# Patient Record
Sex: Female | Born: 1950 | Race: Asian | Hispanic: No | Marital: Married | Smoking: Never smoker
Health system: Southern US, Community
[De-identification: ages and names within clinical notes are randomized; demographics above are authoritative.]

## PROBLEM LIST (undated history)

## (undated) DIAGNOSIS — R7303 Prediabetes: Secondary | ICD-10-CM

## (undated) DIAGNOSIS — C20 Malignant neoplasm of rectum: Secondary | ICD-10-CM

## (undated) DIAGNOSIS — E119 Type 2 diabetes mellitus without complications: Secondary | ICD-10-CM

## (undated) DIAGNOSIS — R221 Localized swelling, mass and lump, neck: Secondary | ICD-10-CM

## (undated) DIAGNOSIS — I10 Essential (primary) hypertension: Secondary | ICD-10-CM

## (undated) DIAGNOSIS — M6530 Trigger finger, unspecified finger: Secondary | ICD-10-CM

## (undated) HISTORY — DX: Type 2 diabetes mellitus without complications: E11.9

## (undated) HISTORY — DX: Malignant neoplasm of rectum: C20

## (undated) HISTORY — DX: Essential (primary) hypertension: I10

## (undated) HISTORY — DX: Localized swelling, mass and lump, neck: R22.1

---
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
## 1996-07-17 DIAGNOSIS — R221 Localized swelling, mass and lump, neck: Secondary | ICD-10-CM

## 1996-07-17 HISTORY — DX: Localized swelling, mass and lump, neck: R22.1

## 1996-07-17 HISTORY — PX: NECK SURGERY: SHX720

## 2016-06-21 NOTE — Congregational Nurse Program (Signed)
Congregational Nurse Program Note  Date of Encounter: 06/21/2016  Past Medical History: No past medical history on file.  Encounter Details:  Patient came to CN office with her husband seeking a medical home for herself.  She was hoping to get an appointment with [facility] where her husband is a patient but unfortunately they are not accepting new patients until Jan. 2018.  States she sometimes has headaches with tingling in left arm.  BP 160/90.  Referred patient to [facility] [place].  Also Assisted her with reapplying for Medicaid and with Food Stamp problem.  Jake Michaelis RN, Congregational Nurse [phone]     CNP Questionnaire - 06/21/16 2119      Patient Demographics   Is this a new or existing patient? New   Patient is considered a/an Refugee   Race Asian     Patient Assistance   Location of Patient Assistance Not Applicable   Patient's financial/insurance status Self-Pay (Uninsured)   Uninsured Patient ([facility] Onc[facility]) No   Patient referred to apply for the following financial assistance Medicaid   Food insecurities addressed Referred to food bank or Primary school teacher No   Assistance securing medications No   Doctor, hospital the healthcare system;Hypertension     Encounter Details   Primary purpose of visit Chronic Illness/Condition Visit;Navigating the Healthcare System;Other   Was an Emergency Department visit averted? Not Applicable   Does patient have a medical provider? No   Patient referred to Establish PCP;Urgent Care   Was a mental health screening completed? (GAINS tool) No   Does patient have dental issues? No   Does patient have vision issues? No   Does your patient have an abnormal blood pressure today? Yes   Since previous encounter, have you referred patient for abnormal blood pressure that resulted in a new diagnosis or medication change? No   Does your patient have an abnormal  blood glucose today? No   Since previous encounter, have you referred patient for abnormal blood glucose that resulted in a new diagnosis or medication change? No   Was there a life-saving intervention made? No

## 2016-06-22 ENCOUNTER — Ambulatory Visit (INDEPENDENT_AMBULATORY_CARE_PROVIDER_SITE_OTHER): Payer: Self-pay | Admitting: Pulmonary Disease

## 2016-06-22 ENCOUNTER — Encounter: Payer: Self-pay | Admitting: Pulmonary Disease

## 2016-06-22 ENCOUNTER — Encounter (INDEPENDENT_AMBULATORY_CARE_PROVIDER_SITE_OTHER): Payer: Self-pay

## 2016-06-22 VITALS — BP 158/98 | HR 56 | Temp 98.0°F | Wt 123.6 lb

## 2016-06-22 DIAGNOSIS — R42 Dizziness and giddiness: Secondary | ICD-10-CM | POA: Insufficient documentation

## 2016-06-22 DIAGNOSIS — Z0000 Encounter for general adult medical examination without abnormal findings: Secondary | ICD-10-CM | POA: Insufficient documentation

## 2016-06-22 DIAGNOSIS — I10 Essential (primary) hypertension: Secondary | ICD-10-CM

## 2016-06-22 MED ORDER — LISINOPRIL 10 MG PO TABS: 10.0000 mg | ORAL_TABLET | Freq: Every day | ORAL | 1 refills | Status: DC

## 2016-06-22 NOTE — Assessment & Plan Note (Addendum)
Assessment: She has had BP measurements of 160/90 and 158/98 on two separate occasions. Given the level of elevation, will initiate therapy.  Plan: CMP today Start lisinopril 10mg  daily Follow up in 1 month with repeat BMP.

## 2016-06-22 NOTE — Patient Instructions (Signed)
T?ng huy?t p (Hypertension) T?ng huy?t p, th??ng ???c g?i l huy?t p cao, l khi l?c b?m mu qua ??ng m?ch c?a qu v? qu m?nh. ??ng m?ch c?a qu v? l cc m?ch mu mang mu t? tim ?i kh?p c? th? c?a qu v?. K?t qu? ?o huy?t p c m?t con s? cao v m?t con s? th?p, ch?ng h?n nh? 110/72. Con s? cao (tm thu) l p l?c bn trong ??ng m?ch khi tim qu v? b?m. Con s? th?p (tm tr??ng) l p l?c bn trong ??ng m?ch khi tim qu v? gin ra. Huy?t p l t??ng c?n cho qu v? ph?i d??i 120/80. Ch?ng t?ng huy?t p bu?c tim qu v? ph?i lm vi?c v?t v? h?n ?? b?m mu. ??ng m?ch c?a qu v? c th? b? h?p ho?c c?ng. Huy?t p cao khng ???c ?i?u tr? ho?c khng ???c ki?m sot c th? d?n t?i nh?i mu c? tim, ??t qu?, b?nh th?n v nh?ng v?n ?? khc. CC Y?U T? NGUY C? M?t s? y?u t? nguy c? d?n ??n huy?t p cao c th? ki?m sot ???c. M?t s? y?u t? khc th khng. Nh?ng y?u t? nguy c? khng th? ki?m sot ???c bao g?m:  Ch?ng t?c. Qu v? c nguy c? cao h?n n?u qu v? l ng??i M? g?c Phi.  ?? tu?i. Nguy c? t?ng ln theo ?? tu?i.  Gi?i tnh. Nam gi?i c nguy c? cao h?n ph? n? tr??c tu?i 45. Sau tu?i 65, ph? n? c nguy c? cao h?n nam gi?i. Nh?ng y?u t? nguy c? c th? ki?m sot ???c bao g?m:  Khng t?p th? d?c ho?c cc ho?t ??ng th? ch?t ??y ??Marland Kitchen  Th?a cn.  ?n qu nhi?u ch?t bo, ???ng, ca-lo, ho?c mu?i.  U?ng qu nhi?u r??u. D?U HI?U V TRI?U CH?NG T?ng huy?t p th??ng khng gy ra d?u hi?u ho?c tri?u ch?ng. Huy?t p r?t cao (c?n cao huy?t p) c th? gy ?au ??u, lo l?ng, kh th? v ch?y mu cam. CH?N ?ON ?? ki?m tra xem qu v? c t?ng huy?t p khng, chuyn gia ch?m [place] s?c kh?e c?a qu v? s? ?o huy?t p trong khi qu v? ng?i ??t tay ? m?c ngang v?i tim. Huy?t p c?n ???c ?o t nh?t hai l?n trn cng m?t cnh tay. M?t s? tnh tr?ng nh?t ??nh c th? lm cho huy?t p khc nhau gi?a tay ph?i v tay tri c?a qu v?. K?t qu? ?o huy?t p cao h?n bnh th??ng ? m?t th?i ?i?m no ? khng c ngh?a l qu v? c?n ?i?u tr?Marland Kitchen  N?u khng r li?u qu v? c huy?t p cao hay khng, qu v? c th? ???c ?? ngh? tr? l?i vo m?t ngy khc ?? ki?m tra l?i huy?t p. Ho?c qu v? c th? ???c yu c?u theo di huy?t p ? nh trong 1 tu?n ho?c h?n. ?I?U TR? ?i?u tr? huy?t p cao gao g?m thay ??i l?i s?ng v c th? ph?i dng thu?c. C m?t l?i s?ng lnh m?nh c th? gip lm gi?m huy?t p cao. Qu v? c th? c?n thay ??i m?t s? thi quen. Thay ??i l?i s?ng c th? bao g?m:  Th?c hi?n ch? ?? ?n DASH. Ch? ?? ?n ny c nhi?u tri cy, rau v ng? c?c nguyn h?t. C t mu?i, th?t ??, v t b? sung ???ng.  Duy tr l??ng mu?i tiu th? d??i 2.300 mg m?i ngy.  T?p aerobic t nh?t 30-45 pht t nh?t 4  l?n m?i tu?n.  Gi?m cn n?u c?n thi?t.  Khng ht thu?c.  H?n ch? ?? u?ng c c?n.  H?c cc cch gi?m c?ng th?ng. Chuyn gia ch?m [place] s?c kh?e c th? k ??n thu?c n?u thay ??i l?i s?ng khng ?? ?? ??a huy?t p v? m?c c th? ki?m sot ???c v n?u m?t trong nh?ng ?i?u sau l ?ng:  Qu v? t? 18-59 tu?i v huy?t p tm thu c?a qu v? trn 140.  Qu v? t? 21 tu?i tr? ln v huy?t p tm thu c?a qu v? trn 150.  Huy?t p tm tr??ng c?a qu v? trn 90.  Qu v? b? ti?u ???ng v huy?t p tm thu c?a qu v? trn 140 ho?c huy?t p tm tr??ng c?a qu v? trn 90.  Qu v? b? b?nh th?n v huy?t p qu v? trn 140/90.  Qu v? b? b?nh tim v huy?t p qu v? trn 140/90. Huy?t p m?c tiu c nhn c?a qu v? c th? khc nhau ty thu?c v tnh tr?ng b?nh l, tu?i v cc nhn t? khc. H??NG D?N CH?M [place] T?I NH  Ki?m tra l?i huy?t p c?a qu v? theo ch? d?n c?a chuyn gia ch?m [place] s?c kh?e.  Ch? s? d?ng thu?c theo ch? d?n c?a chuyn gia ch?m [place] s?c kh?e. Lm theo ch? d?n m?t cch c?n th?n. Thu?c ?i?u tr? huy?t p ph?i ???c dng theo ??n ? k. Thu?c c?ng s? khng c tc d?ng khi qu v? b? li?u. Vi?c b? li?u thu?c c?ng lm qu v? c nguy c? pht sinh v?n ??Maggie Schwalbe ht thu?c.  Theo di huy?t p c?a qu v? ? nh theo ch? d?n c?a chuyn gia ch?m [place] Ruiz s?c  kh?e. ?I KHM N?U:  Qu v? ngh? qu v? c ph?n ?ng v?i thu?c ?ang dng.  Qu v? b? ?au ??u ho?c c?m th?y chng m?t ti di?n.  Qu v? b? s?ng ph ? m?t c chn.  Qu v? c v?n ?? v? th? l?c. NGAY L?P T?C ?I KHM N?U:  Qu v? b? ?au ??u n?ng ho?c l l?n.  Qu v? b? y?u b?t th??ng, t b, ho?c c?m th?y nh? ng?t x?u.  Qu v? b? ?au ng?c ho?c ?au b?ng r?t nhi?u.  Qu v? nn nhi?u l?n.  Qu v? b? kh th?. ??M B?O QU V?:  Hi?u r cc h??ng d?n ny.  S? theo di tnh tr?ng c?a mnh.  S? yu c?u tr? gip ngay l?p t?c n?u qu v? c?m th?y khng kh?e ho?c th?y tr?m tr?ng h?n. Thng tin ny khng nh?m m?c ?ch thay th? cho l?i khuyn m chuyn gia ch?m [place] s?c kh?e ni v?i qu v?. Hy b?o ??m qu v? ph?i th?o lu?n b?t k? v?n ?? g m qu v? c v?i chuyn gia ch?m [place] s?c kh?e c?a qu v?. Document Released: 07/03/2005 Document Revised: 03/24/2015 Document Reviewed: 04/25/2013 Elsevier Interactive Patient Education  2017 [employer].

## 2016-06-22 NOTE — Assessment & Plan Note (Addendum)
She does not have immunization records. At follow up or whenever she has insurance coverage, please obtain TB Quantiferon, HIV screen, Hep C screen, Hep B hepatitis B surface antigen (HBSAg), surface antibody (HBSAb or anti-HBS), and core antibody.   Offer Tdap, zoster, PCV13 at follow up. May consider varicella titers prior to offering varicella vaccine. Does not need MMR given she is over age 65.

## 2016-06-22 NOTE — Assessment & Plan Note (Signed)
She has sensations of lightheadedness. Will treat her hypertension and reassess at follow up.  Check TSH at follow up given her left neck surgery history with unknown pathology.

## 2016-06-22 NOTE — Progress Notes (Signed)
   CC: elevated blood pressure  HPI:  [name] is a 65 y.o. [place] speaking [place] woman who is here for follow up of elevated blood pressure.  She has not seen a physician in a long time. She came to the [place] from [place] 4 years ago. Does not have CAD or DM as far as she knows. She did not get regular medical care in [place] and would just be seen for acute issues such as UTI or acute sinusitis.  She has nocturnal polyuria. She denies polydipsia. She has occasional lightheadedness.   Past Medical History:  Diagnosis Date  . Neck mass 1998   Unknown biopsy results. Excised in [place].   Family History  Problem Relation Age of Onset  . Headache Son    Social History   Social History  . Marital status: Married    Spouse name: Scientist, product/process development  . Number of children: N/A  . Years of education: N/A   Social History Main Topics  . Smoking status: Never Smoker  . Smokeless tobacco: Never Used  . Alcohol use No  . Drug use: No  . Sexual activity: Not on file   Other Topics Concern  . Not on file   Social History Narrative   Came to [place] from [place] in 2013.   Does not know much family history [place] medical care.    Review of Systems:   Constitutional: no fevers/chills Eyes: no vision changes Ears, nose, mouth, throat, and face: no cough Respiratory: no shortness of breath Cardiovascular: no chest pain Gastrointestinal: no nausea/vomiting, no abdominal pain, no diarrhea Genitourinary: no dysuria Integument: no rash Hematologic/lymphatic: no bleeding Musculoskeletal: chronic back pain Neurological: chronic paresthesias in left arm  Physical Exam:  Vitals:   06/22/16 0916  BP: (!) 158/98  Pulse: (!) 56  Temp: 98 F (36.7 C)  SpO2: 100%  Weight: 123 lb 9.6 oz (56.1 kg)   General Apperance: NAD HEENT: Normocephalic, atraumatic, anicteric sclera Neck: Supple, trachea midline Lungs: Clear  to auscultation bilaterally. No wheezes, rhonchi or rales. Breathing comfortably Heart: Regular rate and rhythm, no murmur/rub/gallop Abdomen: Soft, nontender, nondistended, no rebound/guarding Extremities: Warm and well perfused, no edema Skin: No rashes or lesions Neurologic: Alert and interactive. No gross deficits.   Assessment & Plan:   See Encounters Tab for problem based charting.  Patient discussed with Dr. Lynnae January

## 2016-06-23 LAB — CBC WITH DIFFERENTIAL/PLATELET
BASOS ABS: 0.1 10*3/uL (ref 0.0–0.2)
BASOS: 2 %
EOS (ABSOLUTE): 0.2 10*3/uL (ref 0.0–0.4)
Eos: 3 %
HEMATOCRIT: 39.5 % (ref 34.0–46.6)
HEMOGLOBIN: 12.6 g/dL (ref 11.1–15.9)
IMMATURE GRANS (ABS): 0 10*3/uL (ref 0.0–0.1)
Immature Granulocytes: 0 %
LYMPHS ABS: 1.6 10*3/uL (ref 0.7–3.1)
Lymphs: 28 %
MCH: 25.9 pg — ABNORMAL LOW (ref 26.6–33.0)
MCHC: 31.9 g/dL (ref 31.5–35.7)
MCV: 81 fL (ref 79–97)
MONOCYTES: 6 %
Monocytes Absolute: 0.3 10*3/uL (ref 0.1–0.9)
NEUTROS ABS: 3.4 10*3/uL (ref 1.4–7.0)
Neutrophils: 61 %
Platelets: 219 10*3/uL (ref 150–379)
RBC: 4.86 x10E6/uL (ref 3.77–5.28)
RDW: 15.1 % (ref 12.3–15.4)
WBC: 5.5 10*3/uL (ref 3.4–10.8)

## 2016-06-23 LAB — BMP8+ANION GAP

## 2016-06-23 NOTE — Progress Notes (Signed)
Internal Medicine Clinic Attending  Case discussed with Dr. Krall at the time of the visit.  We reviewed the resident's history and exam and pertinent patient test results.  I agree with the assessment, diagnosis, and plan of care documented in the resident's note.  

## 2016-06-27 LAB — CMP14 + ANION GAP
A/G RATIO: 1.6 (ref 1.2–2.2)
ALT: 8 IU/L (ref 0–32)
ANION GAP: 14 mmol/L (ref 10.0–18.0)
AST: 15 IU/L (ref 0–40)
Albumin: 4.1 g/dL (ref 3.6–4.8)
Alkaline Phosphatase: 64 IU/L (ref 39–117)
BUN/Creatinine Ratio: 24 (ref 12–28)
BUN: 17 mg/dL (ref 8–27)
Bilirubin Total: 0.3 mg/dL (ref 0.0–1.2)
CALCIUM: 9.2 mg/dL (ref 8.7–10.3)
CO2: 25 mmol/L (ref 18–29)
CREATININE: 0.72 mg/dL (ref 0.57–1.00)
Chloride: 105 mmol/L (ref 96–106)
GFR, EST AFRICAN AMERICAN: 102 mL/min/{1.73_m2} (ref >59)
GFR, EST NON AFRICAN AMERICAN: 88 mL/min/{1.73_m2} (ref >59)
GLUCOSE: 92 mg/dL (ref 65–99)
Globulin, Total: 2.5 g/dL (ref 1.5–4.5)
POTASSIUM: 4.2 mmol/L (ref 3.5–5.2)
Sodium: 144 mmol/L (ref 134–144)
TOTAL PROTEIN: 6.6 g/dL (ref 6.0–8.5)

## 2016-06-27 LAB — SPECIMEN STATUS REPORT

## 2016-07-27 ENCOUNTER — Telehealth: Payer: Self-pay | Admitting: Internal Medicine

## 2016-07-27 NOTE — Telephone Encounter (Signed)
APT. REMINDER CALL, LMTCB °

## 2016-07-28 ENCOUNTER — Ambulatory Visit: Payer: Self-pay

## 2016-08-04 ENCOUNTER — Encounter: Payer: Self-pay | Admitting: Internal Medicine

## 2016-08-04 ENCOUNTER — Ambulatory Visit (INDEPENDENT_AMBULATORY_CARE_PROVIDER_SITE_OTHER): Payer: Self-pay | Admitting: Internal Medicine

## 2016-08-04 VITALS — BP 151/81 | HR 50 | Temp 97.9°F | Wt 118.9 lb

## 2016-08-04 DIAGNOSIS — M79642 Pain in left hand: Secondary | ICD-10-CM

## 2016-08-04 DIAGNOSIS — G5603 Carpal tunnel syndrome, bilateral upper limbs: Secondary | ICD-10-CM | POA: Insufficient documentation

## 2016-08-04 DIAGNOSIS — I10 Essential (primary) hypertension: Secondary | ICD-10-CM

## 2016-08-04 MED ORDER — IBUPROFEN 200 MG PO TABS: 200.0000 mg | ORAL_TABLET | Freq: Four times a day (QID) | ORAL | 0 refills | Status: DC | PRN

## 2016-08-04 MED ORDER — LISINOPRIL 20 MG PO TABS: 20.0000 mg | ORAL_TABLET | Freq: Every day | ORAL | 1 refills | Status: DC

## 2016-08-04 NOTE — Patient Instructions (Signed)
It was a pleasure to see you today [name].  I have changed your lisinopril prescription from 10 mg daily to 20mg  daily. Taking this medication at night is fine as it lasts almost a day after each dose.  For your hand pain I recommend using over the counter Ibuprofen 200mg  or 400mg  every 6 hours as needed. I suspect there is either arthritis of the joint or tendon irritation either will typically improve with these anti inflammatory medicines.  You should try to return to clinic in about a month for a recheck. Hopefully your blood pressure will be controlled at that time.

## 2016-08-04 NOTE — Progress Notes (Signed)
   CC: Follow up for hypertension  HPI:  [name] is a 66 y.o. [place] woman here for follow up of her hypertension one month after starting lisinopril. She thinks this medicine might be making her slightly drowsy so she is taking it at night. Besides this she has not noticed any other major changes at all. She is still working on getting established with the community assistance program to get better medical access for testing and treatment. On other review today she does also note some pain in her left hand that bothers her mostly when using her hand. This is a chronic problem and she has not been taking any treatment for it. It is distracting but does not significantly affect her ability to work or perform daily activities.   See problem based assessment and plan below for additional details  Past Medical History:  Diagnosis Date  . Neck mass 1998   Unknown biopsy results. Excised in [place].    Review of Systems:  Review of Systems  Constitutional: Negative for malaise/fatigue.  Eyes: Negative for blurred vision.  Respiratory: Negative for cough.   Cardiovascular: Negative for chest pain and leg swelling.  Gastrointestinal: Negative for abdominal pain.  Musculoskeletal: Positive for joint pain.  Skin: Negative for rash.  Neurological: Negative for dizziness.    Physical Exam: Physical Exam  Constitutional: She is well-developed, well-nourished, and in no distress.  Eyes: Conjunctivae are normal.  Neck: No JVD present.  Cardiovascular: Normal rate and regular rhythm.   Pulmonary/Chest: Effort normal and breath sounds normal.  Musculoskeletal: She exhibits no edema.  Mild tenderness over flexor aspect of left 3rd MCP joint without any synovitis, catching, or abnormal ROM  Skin: Skin is warm and dry. No rash noted.    Vitals:   08/04/16 1405  BP: (!) 151/81  Pulse: (!) 50  Temp: 97.9 F (36.6 C)  TempSrc: Oral  SpO2: 97%  Weight: 118 lb 14.4 oz (53.9 kg)     Assessment & Plan:   See Encounters Tab for problem based charting.  Patient discussed with Dr. Beryle Beams

## 2016-08-05 LAB — BMP8+ANION GAP
Anion Gap: 13 mmol/L (ref 10.0–18.0)
BUN / CREAT RATIO: 21 (ref 12–28)
BUN: 16 mg/dL (ref 8–27)
CHLORIDE: 102 mmol/L (ref 96–106)
CO2: 24 mmol/L (ref 18–29)
Calcium: 8.7 mg/dL (ref 8.7–10.3)
Creatinine, Ser: 0.76 mg/dL (ref 0.57–1.00)
GFR, EST AFRICAN AMERICAN: 95 mL/min/{1.73_m2} (ref >59)
GFR, EST NON AFRICAN AMERICAN: 83 mL/min/{1.73_m2} (ref >59)
GLUCOSE: 176 mg/dL — AB (ref 65–99)
POTASSIUM: 3.6 mmol/L (ref 3.5–5.2)
Sodium: 139 mmol/L (ref 134–144)

## 2016-08-07 NOTE — Progress Notes (Signed)
Medicine attending: Medical history, presenting problems, physical findings, and medications, reviewed with resident physician Dr Christopher Rice on the day of the patient visit and I concur with his evaluation and management plan. 

## 2016-08-07 NOTE — Assessment & Plan Note (Signed)
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 She is not particularly distressed by this seems more of just a bothersome problem that is not impairing her daily function. Recommended she can take ibuprofen as needed for symptoms and see if this is beneficial.

## 2016-08-07 NOTE — Assessment & Plan Note (Signed)
Her blood pressure is still elevated today at 151/81. She is tolerating the lisinopril 10 mg without major adverse effects. I'm not sure about her reported mild drowsiness with this medicine for now taking it at night is fine and we can ask about this again.  Check basic metabolic panel Increase lisinopril to 20 mg once daily Follow-up again in a month

## 2016-08-19 ENCOUNTER — Encounter: Payer: Self-pay | Admitting: Internal Medicine

## 2016-09-08 ENCOUNTER — Encounter: Payer: Self-pay | Admitting: Internal Medicine

## 2016-10-11 ENCOUNTER — Ambulatory Visit (HOSPITAL_COMMUNITY)
Admission: RE | Admit: 2016-10-11 | Discharge: 2016-10-11 | Disposition: A | Discharge status: Home / Self Care | Payer: Self-pay | Source: Ambulatory Visit | Attending: Oncology | Admitting: Oncology

## 2016-10-11 ENCOUNTER — Emergency Department (HOSPITAL_COMMUNITY): Admission: EM | Admit: 2016-10-11 | Discharge: 2016-10-11 | Discharge status: Absent without leave | Payer: Self-pay

## 2016-10-11 ENCOUNTER — Ambulatory Visit (INDEPENDENT_AMBULATORY_CARE_PROVIDER_SITE_OTHER): Payer: Self-pay | Admitting: Internal Medicine

## 2016-10-11 ENCOUNTER — Encounter (INDEPENDENT_AMBULATORY_CARE_PROVIDER_SITE_OTHER): Payer: Self-pay

## 2016-10-11 VITALS — BP 117/71 | HR 59 | Temp 98.1°F | Ht <= 58 in | Wt 108.7 lb

## 2016-10-11 DIAGNOSIS — R058 Other specified cough: Secondary | ICD-10-CM

## 2016-10-11 DIAGNOSIS — R634 Abnormal weight loss: Secondary | ICD-10-CM | POA: Insufficient documentation

## 2016-10-11 DIAGNOSIS — R7612 Nonspecific reaction to cell mediated immunity measurement of gamma interferon antigen response without active tuberculosis: Secondary | ICD-10-CM

## 2016-10-11 DIAGNOSIS — R05 Cough: Secondary | ICD-10-CM | POA: Insufficient documentation

## 2016-10-11 DIAGNOSIS — Z8611 Personal history of tuberculosis: Secondary | ICD-10-CM | POA: Insufficient documentation

## 2016-10-11 DIAGNOSIS — R918 Other nonspecific abnormal finding of lung field: Secondary | ICD-10-CM | POA: Insufficient documentation

## 2016-10-11 DIAGNOSIS — I517 Cardiomegaly: Secondary | ICD-10-CM | POA: Insufficient documentation

## 2016-10-11 NOTE — Progress Notes (Signed)
CC: cough, weight loss  HPI:  Jillian Lutz is a 66 y.o. with a PMH of neck mass s/p excision, hypertension presenting to clinic for weight loss and cough.  Patient states that for the past 4 months she has been having a poor appetite and has been losing weight. For the past two weeks she has had a cough productive of clear sputum, fevers, chills, generalized weakness, diaphoresis, and mild dizziness. She denies hemoptysis, chest pain, shortness of breath, nasal congestion, rhinorrhea, sinus pressure, eye discharge, sore throat, lymphadenopathy, abdominal pain, nausea, vomiting, diarrhea, constipation, hematochezia, melena, dysuria, hematuria, vaginal discharge, breast mass, hair/skin changes, dysphagia, lower extremity swelling.  Patient and husband moved from [place] to the [place] 4 years ago and had been to other counties. She denies IVDU, known TB contacts. He husband has also been having a one month history of cough that is now also productive of clear sputum; his quant gold was positive.  She had a swelling in her neck about 20 years ago that was excised in [place]; she does not know what it was and has not received follow up for it.   Please see problem based Assessment and Plan for status of patients chronic conditions.  Past Medical History:  Diagnosis Date  . Neck mass 1998   Unknown biopsy results. Excised in [place].    Review of Systems:   Review of Systems  Constitutional: Positive for chills, diaphoresis, fever and weight loss (unintentional).  HENT: Negative for congestion, sinus pain and sore throat.   Eyes: Negative for blurred vision, double vision and discharge.  Respiratory: Positive for cough and sputum production (clear). Negative for hemoptysis, shortness of breath and wheezing.   Cardiovascular: Negative for chest pain and leg swelling.  Gastrointestinal: Negative for abdominal pain, blood in stool, constipation, diarrhea, melena, nausea and vomiting.    Genitourinary: Negative for dysuria and hematuria.  Musculoskeletal: Negative for joint pain and myalgias.  Skin: Negative for rash.  Neurological: Positive for weakness and headaches. Negative for dizziness, sensory change and focal weakness.  Psychiatric/Behavioral: Negative for substance abuse.    Physical Exam:  Vitals:   10/11/16 1348  BP: 117/71  Pulse: (!) 59  Temp: 98.1 F (36.7 C)  TempSrc: Oral  SpO2: 97%  Weight: 108 lb 11.2 oz (49.3 kg)  Height: 4\' 10"  (1.473 m)   Physical Exam  Constitutional: She is oriented to person, place, and time. She appears well-developed. No distress.  HENT:  Head: Normocephalic and atraumatic.  Mouth/Throat: Oropharynx is clear and moist.  Eyes: EOM are normal. Right eye exhibits no discharge. Left eye exhibits no discharge. No scleral icterus.  Neck: Normal range of motion. Neck supple. No thyromegaly present.  Surgical scar on left anterior neck with small palpable subcutaneous, irregular nodules along scar- likely scar tissue  Cardiovascular: Normal rate, regular rhythm, normal heart sounds and intact distal pulses.  Exam reveals no gallop and no friction rub.   No murmur heard. Pulmonary/Chest: Effort normal and breath sounds normal. She has no wheezes. She has no rales.  Abdominal: Soft. Bowel sounds are normal. She exhibits no distension and no mass. There is no tenderness. There is no guarding.  Musculoskeletal: Normal range of motion. She exhibits no edema or tenderness.  Lymphadenopathy:       Head (right side): No submental, no submandibular, no preauricular, no posterior auricular and no occipital adenopathy present.       Head (left side): No submental, no submandibular, no preauricular, no posterior auricular  and no occipital adenopathy present.    She has no cervical adenopathy.       Right: No supraclavicular adenopathy present.       Left: No supraclavicular adenopathy present.  Neurological: She is alert and oriented to  person, place, and time. No cranial nerve deficit.  Skin: Skin is warm and dry. Capillary refill takes less than 2 seconds. No rash noted. She is not diaphoretic. No erythema. No pallor.  Psychiatric: She has a normal mood and affect. Her behavior is normal. Judgment and thought content normal.    Assessment & Plan:   See Encounters Tab for problem based charting.   Patient discussed with Dr. Nilsa Nutting, MD Internal Medicine PGY1

## 2016-10-11 NOTE — Congregational Nurse Program (Signed)
Congregational Nurse Program Note  Date of Encounter: 10/11/2016  Past Medical History: Past Medical History:  Diagnosis Date  . Neck mass 1998   Unknown biopsy results. Excised in [place].    Encounter Details: Patient came to CN office .  States she has bee sick for 2 weeks with cough, occasional headache, loss of appetite with weight loss.  States sputum is clear when she coughs.  Temp. 97 and BP 140/86.  She has not taken BP medicine today.  Scheduled appointment for 1:15 today at [facility].  Jake Michaelis RN, Congregational Nurse [phone]     CNP Questionnaire - 10/11/16 1854      Patient Demographics   Is this a new or existing patient? Existing   Patient is considered a/an Refugee   Race Asian     Patient Assistance   Location of Patient Assistance Not Applicable   Patient's financial/insurance status [facility] Connects   Uninsured Patient ([facility] Card/Care Connects) No   Patient referred to apply for the following financial assistance Medicaid   Food insecurities addressed Referred to food bank or Primary school teacher No   Assistance securing medications No   Doctor, hospital the healthcare system     Encounter Details   Primary purpose of visit [place];Acute Illness/Condition Visit   Was an Emergency Department visit averted? Not Applicable   Does patient have a medical provider? Yes   Patient referred to Urgent Care;Follow up with established PCP   Was a mental health screening completed? (GAINS tool) No   Does patient have dental issues? No   Does patient have vision issues? No   Does your patient have an abnormal blood pressure today? Yes   Since previous encounter, have you referred patient for abnormal blood pressure that resulted in a new diagnosis or medication change? No   Does your patient have an abnormal blood glucose today? No   Since previous  encounter, have you referred patient for abnormal blood glucose that resulted in a new diagnosis or medication change? No   Was there a life-saving intervention made? No         Clinical Intake - 10/11/16 1351      Pain   Pain  No/denies pain     Nutrition Screen   BMI - recorded 22.72   Nutritional Status BMI of 19-24  Normal   Nutritional Risks None   Diabetes No     Functional Status   Activities of Daily Living Independent   Ambulation Independent   Medication Administration Independent   Home Management Independent     Risk/Barriers   Barriers to Care Management & Learning Language     Abuse/Neglect   Do you feel unsafe in your current relationship? No   Do you feel physically threatened by others? No   Anyone hurting you at home, work, or school? No   Unable to ask? No     Patient Literacy   How often do you need to have someone help you when you read instructions, pamphlets, or other written materials from your doctor or pharmacy? 5 - Always   What is the last grade level you completed in school? 5TH GRADE     Investment banker, operational Needed? Yes   [place]   Interpreter Name LEK [facility]   Patient Declined Interpreter  No   Patient signed [facility] waiver No     Comments  Information entered by : Lucky Rathke NTII [phone]   1:53PM

## 2016-10-11 NOTE — Patient Instructions (Signed)
We are going to check some blood work today. Please go and get your chest imaged today before you leave the hospital.

## 2016-10-12 LAB — HIV ANTIBODY (ROUTINE TESTING W REFLEX): HIV Screen 4th Generation wRfx: NONREACTIVE

## 2016-10-12 LAB — HEPATITIS B SURFACE ANTIBODY,QUALITATIVE: HEP B SURFACE AB, QUAL: REACTIVE

## 2016-10-12 LAB — HEPATITIS B CORE ANTIBODY, TOTAL: Hep B Core Total Ab: POSITIVE — AB

## 2016-10-12 LAB — HEPATITIS C ANTIBODY: Hep C Virus Ab: <0.1 s/co ratio (ref 0.0–0.9)

## 2016-10-12 LAB — HEPATITIS B SURFACE ANTIGEN: Hepatitis B Surface Ag: NEGATIVE

## 2016-10-13 ENCOUNTER — Encounter: Payer: Self-pay | Admitting: Internal Medicine

## 2016-10-13 LAB — ACID FAST SMEAR (AFB, MYCOBACTERIA): Acid Fast Smear: NEGATIVE

## 2016-10-13 NOTE — Assessment & Plan Note (Addendum)
Patient states that for the past 4 months she has been having a poor appetite and has been losing weight. For the past two weeks she has had a cough productive of clear sputum, fevers, chills, generalized weakness, diaphoresis, and mild dizziness. She denies hemoptysis, chest pain, shortness of breath, nasal congestion, rhinorrhea, sinus pressure, eye discharge, sore throat, lymphadenopathy, abdominal pain, nausea, vomiting, diarrhea, constipation, hematochezia, melena, dysuria, hematuria, vaginal discharge, breast mass, hair/skin changes, dysphagia, lower extremity swelling.  Patient and husband moved from [place] to the [place] 4 years ago and had been to other counties. She denies IVDU, known TB contacts. He husband has also been having a one month history of cough that is now also productive of clear sputum; his quant gold was positive.  She had a swelling in her neck about 20 years ago that was excised in [place]; she does not know what it was and has not received follow up for it.  Per our records, she has lost about 15lbs since early December 2017. No concerning findings on exam.   In setting of progressive weight loss, subjective fevers, chills, and productive cough in a [place] immigrant, I am concerned about active TB. The two presenting complaints may be unrelated as she has a history of an excised neck mass of unknown etiology; HIV, viral hepatitis are also in differential for her unexplained weight loss.   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

## 2016-10-13 NOTE — Assessment & Plan Note (Addendum)
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 Etiology may be unrelated to more recent symptoms as she has a history of a neck mass that was excised in [place] of unknown etiology; also would consider HIV, chronic viral hepatitis, other malignancy.  Plan: --CXR - personally reviewed - cardiomegaly, possible RML infiltrate --quant gold --AFB smear and culture --HIV - nonreactive --HCV - nonreactive --HBV sAg, sAb, cAb total - sAg negative, sAb and cAb's positive consistent with previous infection

## 2016-10-16 NOTE — Progress Notes (Signed)
Internal Medicine Clinic Attending  Case discussed with Dr. Jari Favre at the time of the visit.  We reviewed the resident's history and exam and pertinent patient test results.  I agree with the assessment, diagnosis, and plan of care documented in the resident's note.  There is risk that active TB is the cause of this patient's productive cough and weightloss given immigration history from an endemic area. The chest xray is reassuring with no clear infiltrate and one sputum collected in clinic is negative for AFB so far. We provided her with contact info for the health department which can collect two more sputums and treat if necessary. Quant gold is pending to evaluate for latent TB.

## 2016-10-18 ENCOUNTER — Telehealth: Payer: Self-pay

## 2016-10-18 NOTE — Telephone Encounter (Signed)
Received call from TB Nurse [place] at the [facility] Department. She states she needs to make a home visit to do an assessment based on [facility]'s findings which indicate active TB needs to be ruled out.  Since the patient does not speak English she asked my interpreter Diu Hastshorn to call her and explain she was coming to visit her this morning between 10:00 and 10:30 am.  Diu then stayed on the phone and interpreted during the entire home visit.  Blood was drawn and 3 sputum samples will be obtained and tested for both the patient and her husband.  Her employer was notified that she will be out of work at least the rest of this week.  Jake Michaelis RN, Congregational Nurse [phone]

## 2016-10-19 ENCOUNTER — Other Ambulatory Visit: Payer: Self-pay

## 2016-11-25 LAB — ACID FAST CULTURE WITH REFLEXED SENSITIVITIES (MYCOBACTERIA): Acid Fast Culture: NEGATIVE

## 2016-12-13 ENCOUNTER — Ambulatory Visit
Admission: RE | Admit: 2016-12-13 | Discharge: 2016-12-13 | Disposition: A | Discharge status: Home / Self Care | Payer: No Typology Code available for payment source | Source: Ambulatory Visit | Attending: Internal Medicine | Admitting: Internal Medicine

## 2016-12-13 ENCOUNTER — Other Ambulatory Visit: Payer: Self-pay | Admitting: Internal Medicine

## 2016-12-13 DIAGNOSIS — Z09 Encounter for follow-up examination after completed treatment for conditions other than malignant neoplasm: Secondary | ICD-10-CM

## 2016-12-13 NOTE — Congregational Nurse Program (Signed)
Congregational Nurse Program Note  Date of Encounter: 12/13/2016  Past Medical History: Past Medical History:  Diagnosis Date  . Neck mass 1998   Unknown biopsy results. Excised in [place].    Encounter Details:  Congregational nurse and interpreter [name] accompanied patient to [address] For 2 mos. follow-up chest x-ray ordered by [facility] Department.  Jake Michaelis RN, Congregational Nurse [phone]     CNP Questionnaire - 12/13/16 1642      Patient Demographics   Is this a new or existing patient? Existing   Patient is considered a/an Refugee   Race Asian     Patient Assistance   Location of Patient Assistance Not Applicable   Patient's financial/insurance status [facility];[place] [facility] Connect[facility]   Uninsured Patient ([place] Card/Care Connects) Yes   Interventions Not Applicable   Patient referred to apply for the following financial assistance Not Applicable   Food insecurities addressed Not Applicable   Transportation assistance Yes   Type of Assistance Other   Assistance securing medications No   Educational health offerings Health literacy     Encounter Details   Primary purpose of visit [place]   Was an Emergency Department visit averted? Not Applicable   Does patient have a medical provider? Yes   Patient referred to Not Applicable   Was a mental health screening completed? (GAINS tool) No   Does patient have dental issues? No   Does patient have vision issues? No   Does your patient have an abnormal blood pressure today? No   Since previous encounter, have you referred patient for abnormal blood pressure that resulted in a new diagnosis or medication change? No   Does your patient have an abnormal blood glucose today? No   Since previous encounter, have you referred patient for abnormal blood glucose that resulted in a new diagnosis or medication change? No   Was there a  life-saving intervention made? No

## 2016-12-15 NOTE — Congregational Nurse Program (Signed)
Congregational Nurse Program Note  Date of Encounter: 12/15/2016  Past Medical History: Past Medical History:  Diagnosis Date  . Neck mass 1998   Unknown biopsy results. Excised in [place].    Encounter Details:  CN and interpreter [name] accompanied patient to [facility] Department for 2 mos. TB follow-up with Nurse Practitioner Dot Been.  She states that patient's chest x-ray is much improved and patient says she is feeling better except for a poor appetite.  After 1 more week patient will be able to decrease medication frequency from daily to 2 days/per week and will only be on INH and Rifampin for remaining 2 months.  Jake Michaelis RN, Congregational Nurse [phone]     CNP Questionnaire - 12/15/16 1907      Patient Demographics   Is this a new or existing patient? Existing   Patient is considered a/an Refugee   Race Asian     Patient Assistance   Location of Patient Assistance Not Applicable   Patient's financial/insurance status [facility];[place] [facility]/Care Connects   Uninsured Patient ([place] Card/Care Connects) Yes   Interventions Not Applicable   Patient referred to apply for the following financial assistance Not Applicable   Food insecurities addressed Not Applicable   Transportation assistance Yes   Type of Assistance Other   Assistance securing medications No   Educational health offerings Other     Encounter Details   Primary purpose of visit [place]   Was an Emergency Department visit averted? Not Applicable   Does patient have a medical provider? Yes   Patient referred to Not Applicable   Was a mental health screening completed? (GAINS tool) No   Does patient have dental issues? No   Does patient have vision issues? No   Does your patient have an abnormal blood pressure today? No   Since previous encounter, have you referred patient for abnormal blood pressure that resulted in a new diagnosis or  medication change? No   Does your patient have an abnormal blood glucose today? No   Since previous encounter, have you referred patient for abnormal blood glucose that resulted in a new diagnosis or medication change? No   Was there a life-saving intervention made? No

## 2016-12-25 ENCOUNTER — Ambulatory Visit (INDEPENDENT_AMBULATORY_CARE_PROVIDER_SITE_OTHER): Payer: Self-pay | Admitting: Internal Medicine

## 2016-12-25 ENCOUNTER — Encounter: Payer: Self-pay | Admitting: Internal Medicine

## 2016-12-25 VITALS — BP 156/80 | HR 53 | Temp 98.1°F | Ht <= 58 in | Wt 112.5 lb

## 2016-12-25 DIAGNOSIS — G5603 Carpal tunnel syndrome, bilateral upper limbs: Secondary | ICD-10-CM

## 2016-12-25 NOTE — Progress Notes (Signed)
   CC: Bilateral hand pain  HPI:  JillianJillian Lutz is a 66 y.o. woman with increased wrist pain and parasthesias worse in the left than the right hand.   See problem based assessment and plan below for additional details  Past Medical History:  Diagnosis Date  . Neck mass 1998   Unknown biopsy results. Excised in [place].    Review of Systems:  Review of Systems  Skin: Negative for rash.  Neurological: Positive for tingling and sensory change. Negative for focal weakness and weakness.  Psychiatric/Behavioral: The patient does not have insomnia.     Physical Exam: Physical Exam  Constitutional: She is well-developed, well-nourished, and in no distress.  Musculoskeletal: Normal range of motion. She exhibits no edema or tenderness.  Neurological: She exhibits normal muscle tone.  Sensation greater in 5th digit and lateral 4th Positive tinel's test on left wrist, positive phalen's test 5/5 grip strength in both hands  Skin: Skin is warm and dry. No rash noted.    Vitals:   12/25/16 1005  BP: (!) 156/80  Pulse: (!) 53  Temp: 98.1 F (36.7 C)  TempSrc: Oral  SpO2: 100%  Weight: 112 lb 8 oz (51 kg)  Height: 4\' 10"  (1.473 m)    Assessment & Plan:   See Encounters Tab for problem based charting.  Patient discussed with Dr. Lynnae January

## 2016-12-25 NOTE — Patient Instructions (Addendum)
It was a pleasure to see you today [name].  You have carpal tunnel syndrome, worse on the left side.  We have referred you to see Dr. Barbaraann Barthel in [facility] at 9:30AM tomorrow  for this who might be able to provide a splint and recommendations for treatments as needed

## 2016-12-26 ENCOUNTER — Ambulatory Visit (INDEPENDENT_AMBULATORY_CARE_PROVIDER_SITE_OTHER): Payer: Self-pay | Admitting: Family Medicine

## 2016-12-26 ENCOUNTER — Other Ambulatory Visit: Payer: Self-pay | Admitting: Internal Medicine

## 2016-12-26 ENCOUNTER — Encounter: Payer: Self-pay | Admitting: Family Medicine

## 2016-12-26 DIAGNOSIS — M25532 Pain in left wrist: Secondary | ICD-10-CM

## 2016-12-26 DIAGNOSIS — I10 Essential (primary) hypertension: Secondary | ICD-10-CM

## 2016-12-26 DIAGNOSIS — M25531 Pain in right wrist: Secondary | ICD-10-CM

## 2016-12-26 MED ORDER — PREDNISONE 10 MG PO TABS: ORAL_TABLET | ORAL | 0 refills | Status: DC

## 2016-12-26 NOTE — Progress Notes (Signed)
PCP and consultation requested by: Collier Salina, MD  Subjective:   HPI: Patient is a 66 y.o. female here for bilateral wrist pain.  Interpreter line used for visit. Patient reports she's had about 4 months of bialteral wrist pain. Left worse than right. 6/10 on left, 5/10 on right and sharp. No acute injury or trauma. No swelling. Gets some numbness into fingers, especially middle finger on left. She is right handed. About 4 years ago had similar problem and had injection that helped. No skin changes.  Past Medical History:  Diagnosis Date  . Neck mass 1998   Unknown biopsy results. Excised in [place].    Current Outpatient Prescriptions on File Prior to Visit  Medication Sig Dispense Refill  . ibuprofen (ADVIL,MOTRIN) 200 MG tablet Take 1 tablet (200 mg total) by mouth every 6 (six) hours as needed for mild pain or moderate pain. 60 tablet 0  . lisinopril (PRINIVIL,ZESTRIL) 20 MG tablet Take 1 tablet (20 mg total) by mouth daily. 30 tablet 1   No current facility-administered medications on file prior to visit.     Past Surgical History:  Procedure Laterality Date  . NECK SURGERY Left 1998   Excision of mass in [place]    No Known Allergies  Social History   Social History  . Marital status: Married    Spouse name: Scientist, product/process development  . Number of children: N/A  . Years of education: N/A   Occupational History  . Not on file.   Social History Main Topics  . Smoking status: Never Smoker  . Smokeless tobacco: Never Used  . Alcohol use No  . Drug use: No  . Sexual activity: Not on file   Other Topics Concern  . Not on file   Social History Narrative   Came to [place] from [place] in 2013.   Does not know much family history [place] medical care.    Family History  Problem Relation Age of Onset  . Headache Son     BP (!) 158/84   Pulse (!) 50   Ht 4\' 10"  (1.473 m)   Wt 112 lb (50.8 kg)   BMI 23.41  kg/m   Review of Systems: See HPI above.     Objective:  Physical Exam:  Gen: NAD, comfortable in exam room  Right wrist: No gross deformity, swelling, bruising, atrophy. TTP carpal tunnel mildly.  No other tenderness about hand or wrist. FROM digits, wrist with 5/5 strength. No skin changes. No numbness currently. Mild positive tinels and phalens.  Left wrist: No gross deformity, swelling, bruising, atrophy. TTP carpal tunnel.  No other tenderness about hand or wrist. FROM digits, wrist with 5/5 strength. No skin changes. Sensation diminished thumb and index finger. Positive tinels and phalens.   Assessment & Plan:  1. Bilateral wrist pain - 2/2 carpal tunnel syndrome.  Wrist braces at night and as often as possible during the day.  Try prednisone dose pack then switch to aleve or ibuprofen.  Consider nerve conduction studies, injections if not improving.  F/u in 6 weeks.

## 2016-12-26 NOTE — Patient Instructions (Addendum)
You have carpal tunnel syndrome. Wear the wrist brace at nighttime and as often as possible during the day Prednisone dose pack - take until you're finished with this. AFTER finishing this you can take aleve or ibuprofen if needed. Corticosteroid injection is a consideration to help with pain and inflammation if not improving. If not improving, will consider nerve conduction studies to assess severity. Follow up with me in 6 weeks.

## 2016-12-26 NOTE — Telephone Encounter (Signed)
Requesting to speak with a nurse about meds. pt is at the pharmacy, pt does not have transportation. Please call back.

## 2016-12-27 NOTE — Assessment & Plan Note (Addendum)
HPI: She has increased left more than right but bilateral wrist pain and hand parasthesias. 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 Her exam reproduces pain on flexion and percussion of the median nerve with diminished sensation in its distribution.  A: Carpal tunnel syndrome, bilateral but left greater than right. She does not have any red flags for severe disease such as weakness or muscle atrophy.  P: Referred to sports medicine clinic to assist with obtaining a wrist brace

## 2016-12-27 NOTE — Assessment & Plan Note (Signed)
2/2 carpal tunnel syndrome.  Wrist braces at night and as often as possible during the day.  Try prednisone dose pack then switch to aleve or ibuprofen.  Consider nerve conduction studies, injections if not improving.  F/u in 6 weeks.

## 2016-12-29 NOTE — Congregational Nurse Program (Signed)
Congregational Nurse Program Note  Date of Encounter: 12/29/2016  Past Medical History: Past Medical History:  Diagnosis Date  . Neck mass 1998   Unknown biopsy results. Excised in [place].    Encounter Details: Home visit to deliver BP medicine which was picked up from [address]  Patient has been out of medicine for 1 week and did not have tranportation to get it herself.  Jake Michaelis RN, Congregational Nurse [phone]     CNP Questionnaire - 12/29/16 2149      Patient Demographics   Is this a new or existing patient? Existing   Patient is considered a/an Refugee   Race Asian     Patient Assistance   Location of Patient Assistance Not Applicable   Patient's financial/insurance status [facility];[place] [facility]/Care Connects   Uninsured Patient ([place] Card/Care Connects) Yes   Interventions Not Applicable   Patient referred to apply for the following financial assistance Not Applicable   Food insecurities addressed Not Applicable   Transportation assistance No   Assistance securing medications Yes   Type of Assistance Other   Educational health offerings Not Applicable     Encounter Details   Primary purpose of visit Other   Was an Emergency Department visit averted? Not Applicable   Does patient have a medical provider? Yes   Patient referred to Not Applicable   Was a mental health screening completed? (GAINS tool) No   Does patient have dental issues? No   Does patient have vision issues? No   Does your patient have an abnormal blood pressure today? No   Since previous encounter, have you referred patient for abnormal blood pressure that resulted in a new diagnosis or medication change? No   Does your patient have an abnormal blood glucose today? No   Since previous encounter, have you referred patient for abnormal blood glucose that resulted in a new diagnosis or medication change? No   Was there a life-saving intervention made? No          Clinical Intake - 12/25/16 1006      Pre-visit preparation   Pre-visit preparation completed Yes     Pain   Pain  0-10   Pain Score 7    Pain Type Chronic pain  for awhile has worsened in last few weeks   Pain Location Hand   Pain Orientation Right;Left   Pain Radiating Towards goes to wrist and fingers   Pain Descriptors / Indicators Numbness;Tingling   Pain Onset Other (comment)  for awhile worswning    Pain Frequency Constant   Pain Relieving Factors Tylenol, Motrin, ointment to massage fingers    Effect of Pain on Daily Activities problems feeling and bending fingers on left side.  Middle finger right hand is worse      Nutrition Screen   BMI - recorded 23.51   Nutritional Status BMI of 19-24  Normal   Nutritional Risks None   Diabetes No     Functional Status   Activities of Daily Living Independent   Ambulation Independent   Medication Administration Independent   Home Management Independent     Risk/Barriers   Barriers to Care Management & Learning Language  Rhade- interpreter      Abuse/Neglect   Do you feel unsafe in your current relationship? No   Do you feel physically threatened by others? No   Anyone hurting you at home, work, or school? No   Unable to ask? No   Information provided  on Community resources No     Patient Literacy   How often do you need to have someone help you when you read instructions, pamphlets, or other written materials from your doctor or pharmacy? 1 - Never   What is the last grade level you completed in school? 5th Grade      Language Assistant   Interpreter Needed? No   Social worker Name H"lus [place]   Patient Declined Interpreter  No   Patient signed [place] waiver No     Comments   Information entered by : Sander Nephew, RN 12/25/2016 10:13 AM

## 2016-12-29 NOTE — Progress Notes (Signed)
Internal Medicine Clinic Attending  Case discussed with Dr. Rice at the time of the visit.  We reviewed the resident's history and exam and pertinent patient test results.  I agree with the assessment, diagnosis, and plan of care documented in the resident's note.  

## 2017-01-03 NOTE — Congregational Nurse Program (Signed)
Congregational Nurse Program Note  Date of Encounter: 01/03/2017  Past Medical History: Past Medical History:  Diagnosis Date  . Neck mass 1998   Unknown biopsy results. Excised in [place].    Encounter Details:  Home visit with interpreter [name].  Patient states she gave letter from Dr. Barbaraann Barthel regarding "light duty" job restrictions to her boss on Saturday.  She did not go to work yesterday or today because she understood him to say her job required her to perform duties which were listed as restrictions in the letter.  CN called her boss Jacqulyn Bath' and he was planning to call her today to tell her he had found her a position in another department which meets doctor's orders.  She should return to work tomorrow and report to him.  He thinks she did not understand when he told her he would seek a suitable job for her.  He also stated she can wear her wrist splints at work.  Patient very relieved that she can return to work.  Jake Michaelis RN, Congregational Nurse [phone]     CNP Questionnaire - 01/03/17 1801      Patient Demographics   Is this a new or existing patient? Existing   Patient is considered a/an Refugee   Race Asian     Patient Assistance   Location of Patient Assistance Not Applicable   Patient's financial/insurance status [facility];[place] [facility]/Care Connects   Uninsured Patient ([place] Card/Care Connects) Yes   Interventions Not Applicable   Patient referred to apply for the following financial assistance Not Applicable   Food insecurities addressed Not Applicable   Transportation assistance No   Type of Assistance Other   Assistance securing medications No   Educational health offerings Health literacy     Encounter Details   Primary purpose of visit Other   Was an Emergency Department visit averted? Not Applicable   Does patient have a medical provider? Yes   Patient referred to Not Applicable   Was a mental health screening completed? (GAINS  tool) No   Does patient have dental issues? No   Does patient have vision issues? No   Does your patient have an abnormal blood pressure today? No   Since previous encounter, have you referred patient for abnormal blood pressure that resulted in a new diagnosis or medication change? No   Does your patient have an abnormal blood glucose today? No   Since previous encounter, have you referred patient for abnormal blood glucose that resulted in a new diagnosis or medication change? No   Was there a life-saving intervention made? No         Clinical Intake - 12/25/16 1006      Pre-visit preparation   Pre-visit preparation completed Yes     Pain   Pain  0-10   Pain Score 7    Pain Type Chronic pain  for awhile has worsened in last few weeks   Pain Location Hand   Pain Orientation Right;Left   Pain Radiating Towards goes to wrist and fingers   Pain Descriptors / Indicators Numbness;Tingling   Pain Onset Other (comment)  for awhile worswning    Pain Frequency Constant   Pain Relieving Factors Tylenol, Motrin, ointment to massage fingers    Effect of Pain on Daily Activities problems feeling and bending fingers on left side.  Middle finger right hand is worse      Nutrition Screen   BMI - recorded 23.51   Nutritional Status BMI  of 19-24  Normal   Nutritional Risks None   Diabetes No     Functional Status   Activities of Daily Living Independent   Ambulation Independent   Medication Administration Independent   Home Management Independent     Risk/Barriers   Barriers to Care Management & Learning Language  Rhade- interpreter      Abuse/Neglect   Do you feel unsafe in your current relationship? No   Do you feel physically threatened by others? No   Anyone hurting you at home, work, or school? No   Unable to ask? No   Information provided on Community resources No     Patient Literacy   How often do you need to have someone help you when you read instructions, pamphlets,  or other written materials from your doctor or pharmacy? 1 - Never   What is the last grade level you completed in school? 5th Grade      Language Assistant   Interpreter Needed? No   Social worker Name H"lus [place]   Patient Declined Interpreter  No   Patient signed [place] waiver No     Comments   Information entered by : Sander Nephew, RN 12/25/2016 10:13 AM

## 2017-01-05 ENCOUNTER — Ambulatory Visit: Payer: Self-pay | Admitting: Family Medicine

## 2017-02-06 ENCOUNTER — Ambulatory Visit (INDEPENDENT_AMBULATORY_CARE_PROVIDER_SITE_OTHER): Payer: Self-pay | Admitting: Family Medicine

## 2017-02-06 ENCOUNTER — Encounter: Payer: Self-pay | Admitting: Family Medicine

## 2017-02-06 DIAGNOSIS — M65332 Trigger finger, left middle finger: Secondary | ICD-10-CM

## 2017-02-06 DIAGNOSIS — M6530 Trigger finger, unspecified finger: Secondary | ICD-10-CM

## 2017-02-06 DIAGNOSIS — G5603 Carpal tunnel syndrome, bilateral upper limbs: Secondary | ICD-10-CM

## 2017-02-06 MED ORDER — METHYLPREDNISOLONE ACETATE 40 MG/ML IJ SUSP
20.0000 mg | Freq: Once | INTRAMUSCULAR | Status: AC
  Administered 2017-02-06: 20 mg via INTRA_ARTICULAR

## 2017-02-06 NOTE — Patient Instructions (Signed)
You have carpal tunnel syndrome which is improved. I would still recommend for 6 more weeks wearing the wrist braces at nighttime and as often as possible during the day We gave you a cortisone injection for your trigger finger. Follow up with me in 6 weeks.

## 2017-02-07 NOTE — Addendum Note (Signed)
Addended by: Alphonzo Grieve on: 02/07/2017 07:01 PM   Modules accepted: Orders

## 2017-02-08 DIAGNOSIS — M6530 Trigger finger, unspecified finger: Secondary | ICD-10-CM

## 2017-02-08 HISTORY — DX: Trigger finger, unspecified finger: M65.30

## 2017-02-08 NOTE — Assessment & Plan Note (Signed)
Right 3rd digit trigger finger - discussed options - went ahead with injection today.    After informed written consent patient was seated on exam table.  Area overlying right 3rd digit A1 pulley prepped with alcohol swab then injected with 0.5:0.46mL bupivicaine: depomedrol.  Patient tolerated procedure well without immediate complications.

## 2017-02-08 NOTE — Progress Notes (Signed)
PCP and consultation requested by: Collier Salina, MD  Subjective:   HPI: Patient is a 66 y.o. female here for bilateral wrist pain.  6/12: Interpreter line used for visit. Patient reports she's had about 4 months of bialteral wrist pain. Left worse than right. 6/10 on left, 5/10 on right and sharp. No acute injury or trauma. No swelling. Gets some numbness into fingers, especially middle finger on left. She is right handed. About 4 years ago had similar problem and had injection that helped. No skin changes.  7/24: Interpreter present for visit. Patient reports she has improved since last visit. Left wrist up to 3/10 at worst, right 4/10 at worst. Wearing wrist braces and finished prednisone. Numbness much better. Primary problem is left 3rd digit feels like it is swollen and can't fully flex - stiff if tries to do so. Also gets stuck in flexion. No skin changes.  Past Medical History:  Diagnosis Date  . Neck mass 1998   Unknown biopsy results. Excised in [place].    Current Outpatient Prescriptions on File Prior to Visit  Medication Sig Dispense Refill  . ibuprofen (ADVIL,MOTRIN) 200 MG tablet Take 1 tablet (200 mg total) by mouth every 6 (six) hours as needed for mild pain or moderate pain. 60 tablet 0  . lisinopril (PRINIVIL,ZESTRIL) 20 MG tablet TAKE ONE TABLET BY MOUTH ONCE DAILY 30 tablet 2  . predniSONE (DELTASONE) 10 MG tablet 6 tabs po day 1, 5 tabs po day 2, 4 tabs po day 3, 3 tabs po day 4, 2 tabs po day 5, 1 tab po day 6 21 tablet 0   No current facility-administered medications on file prior to visit.     Past Surgical History:  Procedure Laterality Date  . NECK SURGERY Left 1998   Excision of mass in [place]    No Known Allergies  Social History   Social History  . Marital status: Married    Spouse name: Scientist, product/process development  . Number of children: N/A  . Years of education: N/A   Occupational History  . Not on file.   Social History Main Topics   . Smoking status: Never Smoker  . Smokeless tobacco: Never Used  . Alcohol use No  . Drug use: No  . Sexual activity: Not on file   Other Topics Concern  . Not on file   Social History Narrative   Came to [place] from [place] in 2013.   Does not know much family history [place].    Family History  Problem Relation Age of Onset  . Headache Son     BP (!) 158/81   Pulse (!) 55   Ht 4\' 10"  (1.473 m)   Wt 112 lb (50.8 kg)   BMI 23.41 kg/m   Review of Systems: See HPI above.     Objective:  Physical Exam:  Gen: NAD, comfortable in exam room  Right wrist: No gross deformity, swelling, bruising, atrophy. No TTP carpal tunnel.  No other tenderness about hand or wrist. FROM digits, wrist with 5/5 strength. No skin changes. No numbness currently. Negative tinels.  Left wrist: No gross deformity, swelling, bruising, atrophy. No TTP carpal tunnel.  TTP at A1 pulley 3rd digit.  No other tenderness about hand or wrist. FROM wrist with 5/5 strength.  FROM digits except slightly diminished flexion of 3rd digit. No skin changes. Sensation diminished thumb and index finger. Negative tinels.  Assessment & Plan:  1. Bilateral wrist pain - 2/2 carpal tunnel syndrome.  Improved.  Continue wrist braces at night and as often as possible during the day.  S/p prednisone.  Consider nerve conduction studies, injections if not improving.  F/u in 6 weeks.  2. Right 3rd digit trigger finger - discussed options - went ahead with injection today.    After informed written consent patient was seated on exam table.  Area overlying right 3rd digit A1 pulley prepped with alcohol swab then injected with 0.5:0.67mL bupivicaine: depomedrol.  Patient tolerated procedure well without immediate complications.

## 2017-02-08 NOTE — Assessment & Plan Note (Signed)
Improved.  Continue wrist braces at night and as often as possible during the day.  S/p prednisone.  Consider nerve conduction studies, injections if not improving.  F/u in 6 weeks.

## 2017-02-09 ENCOUNTER — Encounter: Payer: Self-pay | Admitting: Internal Medicine

## 2017-02-09 ENCOUNTER — Ambulatory Visit (INDEPENDENT_AMBULATORY_CARE_PROVIDER_SITE_OTHER): Payer: Self-pay | Admitting: Internal Medicine

## 2017-02-09 VITALS — BP 129/69 | HR 60 | Temp 98.1°F | Ht <= 58 in | Wt 117.4 lb

## 2017-02-09 DIAGNOSIS — Z0000 Encounter for general adult medical examination without abnormal findings: Secondary | ICD-10-CM

## 2017-02-09 DIAGNOSIS — Z23 Encounter for immunization: Secondary | ICD-10-CM

## 2017-02-09 DIAGNOSIS — I10 Essential (primary) hypertension: Secondary | ICD-10-CM

## 2017-02-09 DIAGNOSIS — G5603 Carpal tunnel syndrome, bilateral upper limbs: Secondary | ICD-10-CM

## 2017-02-09 DIAGNOSIS — Z79899 Other long term (current) drug therapy: Secondary | ICD-10-CM

## 2017-02-09 NOTE — Progress Notes (Signed)
   CC: Follow up for hypertension and carpal tunnel syndrome  HPI:  [name] is a 66 y.o. woman here for follow up of her carpal tunnel syndrome and her hypertension.   See problem based assessment and plan below for additional details  Past Medical History:  Diagnosis Date  . Neck mass 1998   Unknown biopsy results. Excised in [place].    Review of Systems:  Review of Systems  Constitutional: Negative for chills and fever.  Respiratory: Negative for cough.   Cardiovascular: Negative for chest pain.  Musculoskeletal: Positive for joint pain. Negative for falls, myalgias and neck pain.  Skin: Negative for rash.  Neurological: Negative for tingling, sensory change and focal weakness.    Physical Exam: Physical Exam  Constitutional: No distress.  Cardiovascular: Normal rate and regular rhythm.   Pulmonary/Chest: Effort normal and breath sounds normal.  Musculoskeletal: Normal range of motion. She exhibits no edema or deformity.  Hand pain on forcible flexion of left wrist, sensation intact with normal grip strength    Vitals:   02/09/17 1356  BP: 129/69  Pulse: 60  Temp: 98.1 F (36.7 C)  SpO2: 98%  Weight: 117 lb 6.4 oz (53.3 kg)  Height: 4\' 10"  (1.473 m)    Assessment & Plan:   See Encounters Tab for problem based charting.  Patient discussed with Dr. Evette Doffing

## 2017-02-09 NOTE — Patient Instructions (Signed)
It was a pleasure to see you today [name].  I am glad your hand pain is improving today. I hope this will continue to get better over time. Continue using your wrist brace as able overnight for this going forward.  Your blood pressure is now very good. I recommend continuing to take your medication and staying active.  You received a vaccine for many of the most common bacteria causing pneumonia.

## 2017-02-11 NOTE — Assessment & Plan Note (Signed)
Her wrist pain is about 60% better with use of her splint at night and a course of oral prednisone prescribed by Dr. Barbaraann Barthel. She is having no trouble with work or daily activities due to the pain currently. At this point it is primarily only her left hand that has significant sympoms.  Carpal tunnel syndrome of both hands, L>R, improving  I recommended she continue use of her wrist brace at night as long as tolerable

## 2017-02-11 NOTE — Assessment & Plan Note (Signed)
BP Readings from Last 3 Encounters:  02/09/17 129/69  02/06/17 (!) 158/81  12/26/16 (!) 158/84    Lab Results  Component Value Date   NA 139 08/04/2016   K 3.6 08/04/2016   CREATININE 0.76 08/04/2016    Assessment: Blood pressure is now well controlled at 129/69. She is taking lisinopril daily without difficulties.  Plan: Medications:  continue current medications lisinopril 20mg   daily Other plans: RTC in 6 months

## 2017-02-11 NOTE — Assessment & Plan Note (Signed)
She qualifies for preventative vaccination for with PCV 13 vaccine today due to age 66.  PCV13 vaccine given today

## 2017-02-12 NOTE — Progress Notes (Signed)
Internal Medicine Clinic Attending  Case discussed with Dr. Rice at the time of the visit.  We reviewed the resident's history and exam and pertinent patient test results.  I agree with the assessment, diagnosis, and plan of care documented in the resident's note.  

## 2017-02-16 ENCOUNTER — Ambulatory Visit
Admission: RE | Admit: 2017-02-16 | Discharge: 2017-02-16 | Disposition: A | Discharge status: Home / Self Care | Payer: No Typology Code available for payment source | Source: Ambulatory Visit | Attending: Internal Medicine | Admitting: Internal Medicine

## 2017-02-16 ENCOUNTER — Other Ambulatory Visit: Payer: Self-pay | Admitting: Internal Medicine

## 2017-02-16 DIAGNOSIS — A159 Respiratory tuberculosis unspecified: Secondary | ICD-10-CM

## 2017-02-16 NOTE — Congregational Nurse Program (Signed)
Congregational Nurse Program Note  Date of Encounter: 02/16/2017  Past Medical History: Past Medical History:  Diagnosis Date  . Neck mass 1998   Unknown biopsy results. Excised in [place].    Encounter Details: CN and interpreter [name] accompanied patient to [place] at  [facility]. [employer]. because they do not provide interpreters.  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  Jake Michaelis RN, Congregational Nurse [phone]     CNP Questionnaire - 02/16/17 2139      Patient Demographics   Is this a new or existing patient? Existing   Patient is considered a/an Refugee   Race Asian     Patient Assistance   Location of Patient Assistance Not Applicable   Patient's financial/insurance status [facility] [facility]   Uninsured Patient ([place] [facility] Connects) Yes   Interventions Not Applicable   Patient referred to apply for the following financial assistance Not Applicable   Food insecurities addressed Not Applicable   Transportation assistance No   Assistance securing medications No   Educational health offerings Other     Encounter Details   Primary purpose of visit [place]   Was an Emergency Department visit averted? Not Applicable   Does patient have a medical provider? Yes   Patient referred to Not Applicable   Was a mental health screening completed? (GAINS tool) No   Does patient have dental issues? No   Does patient have vision issues? No   Does your patient have an abnormal blood pressure today? No   Since previous encounter, have you referred patient for abnormal blood pressure that resulted in a new diagnosis or medication change? No   Does your patient have an abnormal blood glucose today? No   Since previous encounter, have you referred patient for abnormal blood glucose that resulted in a new diagnosis or medication change? No   Was there a life-saving intervention made? No         Clinical Intake - 02/09/17 1358      Pre-visit preparation   Pre-visit preparation completed No     Pain   Pain  No/denies pain   Pain Score 0-No pain     Nutrition Screen   BMI - recorded 24.54   Nutritional Status BMI of 19-24  Normal   Nutritional Risks None   Diabetes No     Comments   Information entered by : Silverio Decamp [email or web address]

## 2017-02-20 ENCOUNTER — Ambulatory Visit: Payer: Self-pay

## 2017-02-23 NOTE — Congregational Nurse Program (Signed)
Congregational Nurse Program Note  Date of Encounter: 02/23/2017  Past Medical History: Past Medical History:  Diagnosis Date  . Neck mass 1998   Unknown biopsy results. Excised in [place].    Encounter Details:  CN and interpreter [name] accompanied patient to the [facility] Department for her final TB treatment and exam.  Jake Michaelis RN, Congregational nurse [phone]     CNP Questionnaire - 02/23/17 1220      Patient Demographics   Is this a new or existing patient? Existing   Patient is considered a/an Refugee   Race Asian     Patient Assistance   Location of Patient Assistance Not Applicable   Patient's financial/insurance status [facility] [facility]   Uninsured Patient ([place] [facility] Connects) Yes   Interventions Not Applicable   Patient referred to apply for the following financial assistance Not Applicable   Food insecurities addressed Not Applicable   Transportation assistance No   Assistance securing medications No   Educational health offerings Other     Encounter Details   Primary purpose of visit Other   Was an Emergency Department visit averted? Not Applicable   Does patient have a medical provider? Yes   Patient referred to Not Applicable   Was a mental health screening completed? (GAINS tool) No   Does patient have dental issues? No   Does patient have vision issues? No   Does your patient have an abnormal blood pressure today? No   Since previous encounter, have you referred patient for abnormal blood pressure that resulted in a new diagnosis or medication change? No   Does your patient have an abnormal blood glucose today? No   Since previous encounter, have you referred patient for abnormal blood glucose that resulted in a new diagnosis or medication change? No   Was there a life-saving intervention made? No         Clinical Intake - 02/09/17 1358      Pre-visit preparation   Pre-visit  preparation completed No     Pain   Pain  No/denies pain   Pain Score 0-No pain     Nutrition Screen   BMI - recorded 24.54   Nutritional Status BMI of 19-24  Normal   Nutritional Risks None   Diabetes No     Comments   Information entered by : Silverio Decamp [email or web address]

## 2017-03-20 ENCOUNTER — Encounter: Payer: Self-pay | Admitting: Family Medicine

## 2017-03-20 ENCOUNTER — Ambulatory Visit (INDEPENDENT_AMBULATORY_CARE_PROVIDER_SITE_OTHER): Payer: Self-pay | Admitting: Family Medicine

## 2017-03-20 DIAGNOSIS — G5603 Carpal tunnel syndrome, bilateral upper limbs: Secondary | ICD-10-CM

## 2017-03-20 DIAGNOSIS — M6530 Trigger finger, unspecified finger: Secondary | ICD-10-CM

## 2017-03-20 NOTE — Patient Instructions (Signed)
Your right sided symptoms have resolved. You still have evidence of mild left carpal tunnel syndrome. Continue wearing the brace on left side at work and at nighttime. Use the right brace only if needed now.   You have a mild trigger finger of your left hand - if this worsens where you feel you need a shot give me a call. Follow up with me in 6 weeks or as needed otherwise.

## 2017-03-21 NOTE — Progress Notes (Signed)
PCP and consultation requested by: Collier Salina, MD  Subjective:   HPI: Patient is a 66 y.o. female here for bilateral wrist pain.  6/12: Interpreter line used for visit. Patient reports she's had about 4 months of bilateral wrist pain. Left worse than right. 6/10 on left, 5/10 on right and sharp. No acute injury or trauma. No swelling. Gets some numbness into fingers, especially middle finger on left. She is right handed. About 4 years ago had similar problem and had injection that helped. No skin changes.  7/24: Interpreter present for visit. Patient reports she has improved since last visit. Left wrist up to 3/10 at worst, right 4/10 at worst. Wearing wrist braces and finished prednisone. Numbness much better. Primary problem is left 3rd digit feels like it is swollen and can't fully flex - stiff if tries to do so. Also gets stuck in flexion. No skin changes.  9/4: Interpreter present for visit. Patient reports doing much better after last visit. Injection helped left middle trigger finger. She has only mild soreness now of left 3rd digit with some catching - pain currently at 0/10. She is wearing wrist braces regularly at night and during work. No skin changes. Very mild numbness in left hand at times.  Past Medical History:  Diagnosis Date  . Neck mass 1998   Unknown biopsy results. Excised in [place].    Current Outpatient Prescriptions on File Prior to Visit  Medication Sig Dispense Refill  . lisinopril (PRINIVIL,ZESTRIL) 20 MG tablet TAKE ONE TABLET BY MOUTH ONCE DAILY 30 tablet 2   No current facility-administered medications on file prior to visit.     Past Surgical History:  Procedure Laterality Date  . NECK SURGERY Left 1998   Excision of mass in [place]    No Known Allergies  Social History   Social History  . Marital status: Married    Spouse name: Scientist, product/process development  . Number of children: N/A  . Years of education: N/A   Occupational  History  . Not on file.   Social History Main Topics  . Smoking status: Never Smoker  . Smokeless tobacco: Never Used  . Alcohol use No  . Drug use: No  . Sexual activity: Not on file   Other Topics Concern  . Not on file   Social History Narrative   Came to [place] from [place] in 2013.   Does not know much family history [place] medical care.    Family History  Problem Relation Age of Onset  . Headache Son     BP (!) 141/85   Pulse (!) 57   Ht 4\' 10"  (1.473 m)   Wt 117 lb 6.4 oz (53.3 kg)   BMI 24.54 kg/m   Review of Systems: See HPI above.     Objective:  Physical Exam:  Gen: NAD, comfortable in exam room  Right wrist: No gross deformity, swelling, bruising, atrophy. No TTP carpal tunnel.  No other tenderness about hand or wrist. FROM digits, wrist with 5/5 strength. No skin changes. No numbness currently. Negative tinels, phalens.  Left wrist/hand: Reproduced catching with flexion of 3rd PIP joint.  No gross deformity, swelling, bruising, atrophy. No TTP carpal tunnel.  No TTP at A1 pulley 3rd digit.  No other tenderness about hand or wrist. FROM wrist with 5/5 strength.  FROM digits. Sensation intact to light touch. Negative tinels.   Assessment & Plan:  1. Bilateral wrist  pain - 2/2 carpal tunnel syndrome.  Improved.  Some numbness into left hand - will cntinue with bracing here.  S/p prednisone.  Aleve or ibuprofen only if needed.  Consider nerve conduction studies, injections if not improving.  F/u in 6 weeks or prn.  2. Left 3rd digit trigger finger - s/p injection which helped but still with some catching.  Declined repeat injection, splinting for now.  F/u in 6 weeks or prn.

## 2017-03-21 NOTE — Assessment & Plan Note (Signed)
Left 3rd digit trigger finger - s/p injection which helped but still with some catching.  Declined repeat injection, splinting for now.  F/u in 6 weeks or prn.

## 2017-03-21 NOTE — Assessment & Plan Note (Signed)
2/2 carpal tunnel syndrome.  Improved.  Some numbness into left hand - will cntinue with bracing here.  S/p prednisone.  Aleve or ibuprofen only if needed.  Consider nerve conduction studies, injections if not improving.  F/u in 6 weeks or prn.

## 2017-06-04 ENCOUNTER — Ambulatory Visit: Payer: Self-pay

## 2017-08-07 ENCOUNTER — Encounter: Payer: Self-pay | Admitting: *Deleted

## 2017-08-10 ENCOUNTER — Ambulatory Visit: Payer: Self-pay

## 2017-08-13 ENCOUNTER — Other Ambulatory Visit: Payer: Self-pay

## 2017-08-13 ENCOUNTER — Encounter: Payer: Self-pay | Admitting: Internal Medicine

## 2017-08-13 ENCOUNTER — Ambulatory Visit (INDEPENDENT_AMBULATORY_CARE_PROVIDER_SITE_OTHER): Payer: Self-pay | Admitting: Internal Medicine

## 2017-08-13 VITALS — BP 152/82 | HR 59 | Temp 97.4°F | Ht <= 58 in | Wt 129.4 lb

## 2017-08-13 DIAGNOSIS — R350 Frequency of micturition: Secondary | ICD-10-CM

## 2017-08-13 DIAGNOSIS — I10 Essential (primary) hypertension: Secondary | ICD-10-CM

## 2017-08-13 DIAGNOSIS — R300 Dysuria: Secondary | ICD-10-CM | POA: Insufficient documentation

## 2017-08-13 DIAGNOSIS — Z79899 Other long term (current) drug therapy: Secondary | ICD-10-CM

## 2017-08-13 DIAGNOSIS — Z87440 Personal history of urinary (tract) infections: Secondary | ICD-10-CM

## 2017-08-13 LAB — POCT URINALYSIS DIPSTICK
BILIRUBIN UA: NEGATIVE
GLUCOSE UA: NEGATIVE
KETONES UA: NEGATIVE
LEUKOCYTES UA: NEGATIVE
NITRITE UA: NEGATIVE
Protein, UA: NEGATIVE
Spec Grav, UA: 1.02 (ref 1.010–1.025)
Urobilinogen, UA: 0.2 E.U./dL
pH, UA: 7 (ref 5.0–8.0)

## 2017-08-13 MED ORDER — LISINOPRIL 20 MG PO TABS: 20.0000 mg | ORAL_TABLET | Freq: Every day | ORAL | 5 refills | Status: DC

## 2017-08-13 NOTE — Assessment & Plan Note (Signed)
Uncontrolled likely due to being out of medicine. Asymptomatic.  Plan: --refill lisinopril 20mg  daily --f/u in ~1 month with PCP

## 2017-08-13 NOTE — Progress Notes (Signed)
   CC: dysuria  HPI:  Ms.Elzena Petterson is a 67 y.o. with a PMH of TB, HTN, carpal tunnel syndrome presenting to clinic for evaluation of dysuria.  Interview was conducted with facilitation by Dion Body interpreter with patient permission.  Dysuria: Patient states she has had intermittent episodes of dysuria for multiple years that occur about every 6 months. She states episodes are accompanied by frequency and urgency, but denies hematuria, vaginal discharge, vaginal discomfort, suprapubic discomfort, flank pain, fevers, chills, n/v. She self treats affectively with medications from home country of [place]. She brings pictures of two medicines (Cadipredison -?, TanaMisolblu - Methylene blue and camphor but also has marking of SX-TM which traditionally indicates bactrim), and is able to write down "Ampicilin" as the third medicine. She had recurrence of symptoms about 3 days ago and started all three medicines again but is now out. She states improvement of her symptoms.   HTN: Patient states she has been out of lisinopril for about 2 weeks. She denies chest pain, shortness of breath, headaches that are atypical for her, vision or hearing changes.  Please see problem based Assessment and Plan for status of patients chronic conditions.  Past Medical History:  Diagnosis Date  . Neck mass 1998   Unknown biopsy results. Excised in [place].    Review of Systems:   ROS Per HPI  Physical Exam:  Vitals:   08/13/17 1038  BP: (!) 152/82  Pulse: (!) 59  Temp: (!) 97.4 F (36.3 C)  TempSrc: Oral  SpO2: 97%  Weight: 129 lb 6.4 oz (58.7 kg)  Height: 4\' 10"  (1.473 m)   GENERAL- alert, co-operative, appears as stated age, not in any distress. HEENT- oral mucosa appears moist CARDIAC- RRR, no murmurs, rubs or gallops. RESP- Moving equal volumes of air, and clear to auscultation bilaterally, no wheezes or crackles. ABDOMEN- Soft, nontender, bowel sounds present. NEURO- No obvious Cr N  abnormality. EXTREMITIES- pulse 2+ PT, symmetric, no pedal edema. SKIN- Warm, dry.  Assessment & Plan:   See Encounters Tab for problem based charting.   Patient discussed with Dr. Angelia Mould   Alphonzo Grieve, MD Internal Medicine PGY2

## 2017-08-13 NOTE — Patient Instructions (Signed)
I have refilled your blood pressure medicines.  For your urinary symptoms, It looks that you have treated the infection. If you have recurrence of symptoms, please come back to the clinic and we will give you the appropriate medicines.

## 2017-08-13 NOTE — Assessment & Plan Note (Addendum)
Patient with what appears to be recurrent (~2 episodes per year) UTIs which she treats with Ampicilin, Methylene blue (+/- bactrim) and a third unknown medication; this combo has been successful in the past. Current symptoms are resolving with above treatment; she has no s/s of pyelonephritis.  Dipstick UA shows Green urine (likely from methylene blue), trace blood, no LE or nitrites. She likely adequately treated previous UTI.  Methylene blue by itself can cause dysuria.  Plan: --advised to RTC if symptoms recur for UA and appropriate Abx regimen --advised not to take other medicine from home unless discussed with a physician

## 2017-08-14 NOTE — Progress Notes (Signed)
Internal Medicine Clinic Attending  Case discussed with Dr. Svalina  at the time of the visit.  We reviewed the resident's history and exam and pertinent patient test results.  I agree with the assessment, diagnosis, and plan of care documented in the resident's note.  

## 2017-09-26 NOTE — Congregational Nurse Program (Signed)
Congregational Nurse Program Note  Date of Encounter: 09/26/2017  Past Medical History: Past Medical History:  Diagnosis Date  . Neck mass 1998   Unknown biopsy results. Excised in [place].    Encounter Details: CN office visit.  BP 170/98.  States she has been out of Lisinopril for 1 week.  Phone call to [facility].  She has refills available and will pick it up on her way home.  Counseled patient on need to take medicine daily and risks of being non-compliant.  States she understands. Says she has a PCP follow-up appointment on 09/28/2017.  Jake Michaelis RN, Congregational Nurse [phone] CNP Questionnaire - 09/26/17 1909      Questionnaire   Patient Status  Refugee    Race  Asian    Location Patient Served At  Not Applicable    Insurance  Not Applicable    Uninsured  Uninsured (NEW 1x/quarter)    Food  No food insecurities    Housing/Utilities  Yes, have permanent housing    Transportation  No transportation needs    Interpersonal Safety  Yes, feel physically and emotionally safe where you currently live    Medication  No medication insecurities    Medical Provider  Yes    Referrals  Other    ED Visit Averted  Not Applicable    Life-Saving Intervention Made  Not Applicable

## 2017-09-28 ENCOUNTER — Ambulatory Visit (INDEPENDENT_AMBULATORY_CARE_PROVIDER_SITE_OTHER): Payer: Self-pay | Admitting: Internal Medicine

## 2017-09-28 ENCOUNTER — Other Ambulatory Visit: Payer: Self-pay

## 2017-09-28 ENCOUNTER — Encounter: Payer: Self-pay | Admitting: Internal Medicine

## 2017-09-28 VITALS — BP 138/76 | HR 58 | Temp 97.6°F | Ht <= 58 in | Wt 127.9 lb

## 2017-09-28 DIAGNOSIS — I10 Essential (primary) hypertension: Secondary | ICD-10-CM

## 2017-09-28 DIAGNOSIS — F329 Major depressive disorder, single episode, unspecified: Secondary | ICD-10-CM

## 2017-09-28 DIAGNOSIS — R4589 Other symptoms and signs involving emotional state: Secondary | ICD-10-CM | POA: Insufficient documentation

## 2017-09-28 DIAGNOSIS — R5383 Other fatigue: Secondary | ICD-10-CM | POA: Insufficient documentation

## 2017-09-28 DIAGNOSIS — G5603 Carpal tunnel syndrome, bilateral upper limbs: Secondary | ICD-10-CM

## 2017-09-28 DIAGNOSIS — Z79899 Other long term (current) drug therapy: Secondary | ICD-10-CM

## 2017-09-28 DIAGNOSIS — F320 Major depressive disorder, single episode, mild: Secondary | ICD-10-CM

## 2017-09-28 DIAGNOSIS — M65332 Trigger finger, left middle finger: Secondary | ICD-10-CM

## 2017-09-28 DIAGNOSIS — Z780 Asymptomatic menopausal state: Secondary | ICD-10-CM | POA: Insufficient documentation

## 2017-09-28 DIAGNOSIS — Z0000 Encounter for general adult medical examination without abnormal findings: Secondary | ICD-10-CM

## 2017-09-28 NOTE — Progress Notes (Signed)
   CC: Follow up for hypertension, left wrist pain  HPI:  [name] is a 67 y.o. female with PMHx detailed below presenting for follow up after resuming her lisinopril in January but also with a new complaint of left hand pain for the past 2 months. This is mostly numbness and tingling with some pain. It starts at the wrist and travels worst to her fingertips worst at the 3rd digit. She had previous steroid injection for trigger finger at this site in 01/2017 and does not have catching of the finger anymore but feels stiff and sore. Symptoms bother her most when working.  See problem based assessment and plan below for additional details.  Healthcare maintenance She is agreeable to setting up Dexa scan at this time. She reports having had a normal mammogram in 2018 but does know the name of the center where this was obtained.  Essential hypertension Her blood pressure is controlled again today at 138/76 after resuming lisinopril 20mg  daily. She was uncontrolled when off medication. Last labs were 10 months ago we will repeat Bmet today and continue current dose if appropriate.  Carpal tunnel syndrome on both sides Currently she has carpal tunnel syndrome symptoms in the left hand. She is wearing a splint every night and does not use medications for this routinely. She had a good benefit from trigger finger injection last year but stiffness of the 3rd digit in flexion has returned. Ambulatory referral to sports medicine for possible steroid injection for carpal tunnel symptoms worsening despite use of splint.  Depressed mood She describes a depress or helpless mood that is present fairly often. She also feels fatigued at these times. She denies anything such as SI or psychotic features. Her friend is present and also has noticed no social withdrawal or other behavior changes. PHQ-9 score today is 10. Considering new onset of these still mild symptoms I will observe for now. If they persist  without improvement or worsen she should discuss starting a SSRI at a visit after 6-8 weeks from now.   Past Medical History:  Diagnosis Date  . Neck mass 1998   Unknown biopsy results. Excised in [place].    Review of Systems: Review of Systems  Constitutional: Negative for chills and fever.  Gastrointestinal: Negative for diarrhea and nausea.  Musculoskeletal: Positive for joint pain.  Skin: Negative for rash.  Neurological: Positive for tingling and sensory change. Negative for focal weakness.  Psychiatric/Behavioral: Positive for depression.     Physical Exam: Vitals:   09/28/17 1329  BP: 138/76  Pulse: (!) 58  Temp: 97.6 F (36.4 C)  TempSrc: Oral  SpO2: 98%  Weight: 127 lb 14.4 oz (58 kg)  Height: 4\' 10"  (1.473 m)   GENERAL- alert, co-operative, NAD HEENT- Atraumatic, PERRL, oral mucosa appears moist, no cervical LN enlargement. CARDIAC- RRR, no murmurs, rubs or gallops. RESP- CTAB, no wheezes or crackles. NEURO- Grip strength, finger abduction, wrist flexion and extension are all 5/5 EXTREMITIES- pulse 2+, symmetric, No pain on wrist flexion and extension with normal ROM, positive Tinel and Phalen tests, point tenderness at palmar side of head of 3rd MC SKIN- Warm, dry, No rash or lesion. PSYCH- Normal mood and affect, appropriate thought content and speech.   Assessment & Plan:   See encounters tab for problem based medical decision making.   Patient discussed with Dr. Dareen Piano

## 2017-09-28 NOTE — Assessment & Plan Note (Signed)
She describes a depress or helpless mood that is present fairly often. She also feels fatigued at these times. She denies anything such as SI or psychotic features. Her friend is present and also has noticed no social withdrawal or other behavior changes. PHQ-9 score today is 10. Considering new onset of these still mild symptoms I will observe for now. If they persist without improvement or worsen she should discuss starting a SSRI at a visit after 6-8 weeks from now.

## 2017-09-28 NOTE — Patient Instructions (Signed)
It was a pleasure to see you today [name].  Your blood pressure is improved since getting back on your medication. We will check blood work today and make sure this is working well for you.  I recommend calling Dr. Ericka Pontiff office to make an appointment for your left wrist and hand pain getting worse again. It would be reasonable to try injection again since it has been almost a year.  I recommend arranging for bone density xrays at least once to check for osteoporosis.

## 2017-09-28 NOTE — Assessment & Plan Note (Signed)
Currently she has carpal tunnel syndrome symptoms in the left hand. She is wearing a splint every night and does not use medications for this routinely. She had a good benefit from trigger finger injection last year but stiffness of the 3rd digit in flexion has returned. Ambulatory referral to sports medicine for possible steroid injection for carpal tunnel symptoms worsening despite use of splint.

## 2017-09-28 NOTE — Assessment & Plan Note (Signed)
She is agreeable to setting up Dexa scan at this time. She reports having had a normal mammogram in 2018 but does know the name of the center where this was obtained.

## 2017-09-28 NOTE — Assessment & Plan Note (Signed)
Her blood pressure is controlled again today at 138/76 after resuming lisinopril 20mg  daily. She was uncontrolled when off medication. Last labs were 10 months ago we will repeat Bmet today and continue current dose if appropriate.

## 2017-09-29 LAB — BMP8+ANION GAP
ANION GAP: 16 mmol/L (ref 10.0–18.0)
BUN/Creatinine Ratio: 21 (ref 12–28)
BUN: 15 mg/dL (ref 8–27)
CO2: 24 mmol/L (ref 20–29)
Calcium: 9.4 mg/dL (ref 8.7–10.3)
Chloride: 100 mmol/L (ref 96–106)
Creatinine, Ser: 0.7 mg/dL (ref 0.57–1.00)
GFR, EST AFRICAN AMERICAN: 104 mL/min/{1.73_m2} (ref >59)
GFR, EST NON AFRICAN AMERICAN: 91 mL/min/{1.73_m2} (ref >59)
Glucose: 141 mg/dL — ABNORMAL HIGH (ref 65–99)
Potassium: 4.2 mmol/L (ref 3.5–5.2)
SODIUM: 140 mmol/L (ref 134–144)

## 2017-09-29 LAB — TSH: TSH: 0.991 u[IU]/mL (ref 0.450–4.500)

## 2017-10-01 NOTE — Progress Notes (Signed)
Internal Medicine Clinic Attending  Case discussed with Dr. Rice at the time of the visit.  We reviewed the resident's history and exam and pertinent patient test results.  I agree with the assessment, diagnosis, and plan of care documented in the resident's note.  

## 2017-10-23 ENCOUNTER — Encounter: Payer: Self-pay | Admitting: Family Medicine

## 2017-10-23 ENCOUNTER — Ambulatory Visit (INDEPENDENT_AMBULATORY_CARE_PROVIDER_SITE_OTHER): Payer: Self-pay | Admitting: Family Medicine

## 2017-10-23 VITALS — BP 154/91 | HR 64 | Ht 60.0 in | Wt 120.0 lb

## 2017-10-23 DIAGNOSIS — G5603 Carpal tunnel syndrome, bilateral upper limbs: Secondary | ICD-10-CM

## 2017-10-23 DIAGNOSIS — M25531 Pain in right wrist: Secondary | ICD-10-CM

## 2017-10-23 DIAGNOSIS — M25532 Pain in left wrist: Secondary | ICD-10-CM

## 2017-10-23 MED ORDER — PREDNISONE 10 MG PO TABS: ORAL_TABLET | ORAL | 0 refills | Status: DC

## 2017-10-23 NOTE — Patient Instructions (Signed)
Take prednisone dose pack as directed for 6 days. Do not take aleve or ibuprofen while taking this. We will set you up for nerve conduction studies of your left arm. Follow up with me 2-3 days after you have this test done to go over results and next steps.

## 2017-10-23 NOTE — Congregational Nurse Program (Signed)
Congregational Nurse Program Note  Date of Encounter: 10/23/2017  Past Medical History: Past Medical History:  Diagnosis Date  . Neck mass 1998   Unknown biopsy results. Excised in [place].    Encounter Details:  CN accompanied patient to office visit with Dr. Karlton Lemon regarding pain in left hand and middle finger.  He prescribed a prednisone dose pack which CN picked up at [facility].  A home visit was made to deliver the  medicine and explain and write down the dosage instructions.  Patient voiced understanding and said she will start medication today.  Jake Michaelis RN, Congregational Nurse [phone] CNP Questionnaire - 10/23/17 1523      Questionnaire   Patient Status  Refugee    Race  Asian    Location Patient Served At  Not Applicable    Insurance  Not Applicable    Uninsured  Uninsured (NEW 1x/quarter)    Food  No food insecurities    Housing/Utilities  Yes, have permanent housing    Transportation  No transportation needs    Interpersonal Safety  Yes, feel physically and emotionally safe where you currently live    Medication  No medication insecurities    Medical Provider  Yes    Referrals  Other    ED Visit Averted  Not Applicable    Life-Saving Intervention Made  Not Applicable

## 2017-10-25 ENCOUNTER — Encounter: Payer: Self-pay | Admitting: Family Medicine

## 2017-10-25 DIAGNOSIS — M25531 Pain in right wrist: Secondary | ICD-10-CM | POA: Insufficient documentation

## 2017-10-25 DIAGNOSIS — M25532 Pain in left wrist: Secondary | ICD-10-CM

## 2017-10-25 HISTORY — DX: Pain in left wrist: M25.531

## 2017-10-25 NOTE — Progress Notes (Signed)
PCP and consultation requested by: Collier Salina, MD  Subjective:   HPI: Patient is a 67 y.o. female here for bilateral wrist pain.  6/12: Interpreter line used for visit. Patient reports she's had about 4 months of bilateral wrist pain. Left worse than right. 6/10 on left, 5/10 on right and sharp. No acute injury or trauma. No swelling. Gets some numbness into fingers, especially middle finger on left. She is right handed. About 4 years ago had similar problem and had injection that helped. No skin changes.  7/24: Interpreter present for visit. Patient reports she has improved since last visit. Left wrist up to 3/10 at worst, right 4/10 at worst. Wearing wrist braces and finished prednisone. Numbness much better. Primary problem is left 3rd digit feels like it is swollen and can't fully flex - stiff if tries to do so. Also gets stuck in flexion. No skin changes.  03/20/17: Interpreter present for visit. Patient reports doing much better after last visit. Injection helped left middle trigger finger. She has only mild soreness now of left 3rd digit with some catching - pain currently at 0/10. She is wearing wrist braces regularly at night and during work. No skin changes. Very mild numbness in left hand at times.  10/23/17: Interpreter used for visit. Patient reports she is having constant pain primarily in left hand. Associated numbness sometimes into all digits. Getting triggering of left middle finger also. Pain level 8/10 and sharp at times. No skin changes.  Past Medical History:  Diagnosis Date  . Neck mass 1998   Unknown biopsy results. Excised in [place].    Current Outpatient Medications on File Prior to Visit  Medication Sig Dispense Refill  . lisinopril (PRINIVIL,ZESTRIL) 20 MG tablet Take 1 tablet (20 mg total) by mouth daily. 30 tablet 5   No current facility-administered medications on file prior to visit.     Past Surgical History:   Procedure Laterality Date  . NECK SURGERY Left 1998   Excision of mass in [place]    No Known Allergies  Social History   Socioeconomic History  . Marital status: Married    Spouse name: Scientist, product/process development  . Number of children: Not on file  . Years of education: Not on file  . Highest education level: Not on file  Occupational History  . Not on file  Social Needs  . Financial resource strain: Not on file  . Food insecurity:    Worry: Not on file    Inability: Not on file  . Transportation needs:    Medical: Not on file    Non-medical: Not on file  Tobacco Use  . Smoking status: Never Smoker  . Smokeless tobacco: Never Used  Substance and Sexual Activity  . Alcohol use: No  . Drug use: No  . Sexual activity: Not on file  Lifestyle  . Physical activity:    Days per week: Not on file    Minutes per session: Not on file  . Stress: Not on file  Relationships  . Social connections:    Talks on phone: Not on file    Gets together: Not on file    Attends religious service: Not on file    Active member of club or organization: Not on file    Attends meetings of clubs or organizations: Not on file    Relationship status: Not on file  . Intimate partner violence:    Fear of current or ex partner: Not on file  Emotionally abused: Not on file    Physically abused: Not on file    Forced sexual activity: Not on file  Other Topics Concern  . Not on file  Social History Narrative   Came to [place] from [place] in 2013.   Does not know much family history [place].    Family History  Problem Relation Age of Onset  . Headache Son     BP (!) 154/91   Pulse 64   Ht 5' (1.524 m)   Wt 120 lb (54.4 kg)   BMI 23.44 kg/m   Review of Systems: See HPI above.     Objective:  Physical Exam:  Gen: NAD, comfortable in exam room  Right wrist: No deformity, swelling, bruising, atrophy. No TTP carpal tunnel.  No  other tenderness. FROM digits, wrist with 5/5 strength. No skin changes. Negative tinels, phalens. NVI distally.  Left wrist/hand: No deformity, swelling, bruising, atrophy. Minimal tenderness at A1 pulley 3rd digit.  No tenderness carpal tunnel.  No other tenderness. FROM wrist and digits with 5/5 strength. NVI distally. Sensation intact to light touch. Negative tinels.   Assessment & Plan:  1. Bilateral wrist pain - persistent description of carpal tunnel syndrome though current exam reassuring.  Discussed options - will go ahead with prednisone dose pack and nerve conduction studies.  F/u 2-3 days following these to go over results and next steps.

## 2017-10-25 NOTE — Assessment & Plan Note (Signed)
persistent description of carpal tunnel syndrome though current exam reassuring.  Discussed options - will go ahead with prednisone dose pack and nerve conduction studies.  F/u 2-3 days following these to go over results and next steps.

## 2017-11-15 ENCOUNTER — Encounter: Payer: Self-pay | Admitting: *Deleted

## 2017-11-21 NOTE — Congregational Nurse Program (Signed)
Congregational Nurse Program Note  Date of Encounter: 11/21/2017  Past Medical History: Past Medical History:  Diagnosis Date  . Neck mass 1998   Unknown biopsy results. Excised in [place].    Encounter Details:  CN office visit with interpreter [name] assisting.  Reminded patient of appointment on Nov 27, 2017 with [facility].States her hands are feeling much better.  Assisted her with Food Stamp information.  Jake Michaelis RN, Congregational Nurse [phone] CNP Questionnaire - 11/21/17 2111      Questionnaire   Patient Status  Refugee    Race  Asian    Location Patient Served At  Not Applicable    Insurance  Not Applicable    Uninsured  Uninsured (Subsequent visits/quarter)    Food  No food insecurities    Housing/Utilities  Yes, have permanent housing    Transportation  No transportation needs    Interpersonal Safety  Yes, feel physically and emotionally safe where you currently live    Medication  No medication insecurities    Medical Provider  Yes    Referrals  Not Applicable    ED Visit Averted  Not Applicable    Life-Saving Intervention Made  Not Applicable

## 2017-11-27 ENCOUNTER — Encounter (INDEPENDENT_AMBULATORY_CARE_PROVIDER_SITE_OTHER): Payer: Self-pay | Admitting: Neurology

## 2017-11-27 ENCOUNTER — Ambulatory Visit (INDEPENDENT_AMBULATORY_CARE_PROVIDER_SITE_OTHER): Payer: Self-pay | Admitting: Neurology

## 2017-11-27 DIAGNOSIS — G5603 Carpal tunnel syndrome, bilateral upper limbs: Secondary | ICD-10-CM

## 2017-11-27 DIAGNOSIS — M25532 Pain in left wrist: Secondary | ICD-10-CM

## 2017-11-27 DIAGNOSIS — Z0289 Encounter for other administrative examinations: Secondary | ICD-10-CM

## 2017-11-27 DIAGNOSIS — M25531 Pain in right wrist: Secondary | ICD-10-CM

## 2017-11-27 NOTE — Progress Notes (Signed)
Full Name: Kandee Escalante Gender: Female MRN #: [id number] Date of Birth: 1950/09/14    Visit Date: 11/27/2017 09:14 Age: 67 Years 17 Months Old Examining Physician: Arlice Colt, MD  Referring Physician: Karlton Lemon, MD     History:  She is a 67 year old woman with left greater than right hand numbness and discomfort.  Nerve conduction studies: Left median motor response was normal.  The right median motor response was reduced in amplitude and had a prolonged distal latency.  Bilateral ulnar motor responses were normal.  Bilateral median sensory responses had delayed distal latencies with mildly reduced amplitudes.  Bilateral ulnar sensory responses were normal.  Ulnar F-wave responses were normal.    Electromyography: Needle EMG of muscles of the left arm.   The left triceps had a few polyphasic motor units but recruitment was normal.  Other muscles tested on the left were completely normal.  There was no spontaneous activity in any of the muscles.  Needle EMG of muscles of the right hand were also performed.  The first dorsal interosseous muscle was normal.  There was mild chronic denervation noted in the right APB muscle.  Impression: This NCV/EMG study shows the following: 1.   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 2.   Mild median neuropathy at the left wrist. 3.   There is no evidence of any superimposed radiculopathy   Beva Remund A. Felecia Shelling, MD, PhD, FAAN Certified in Neurology, Clinical Neurophysiology, Sleep Medicine, Pain Medicine and Neuroimaging Director, [place] [address] [place], [place] [address] [phone]              [facility]    Nerve / Sites Muscle Latency Ref. Amplitude Ref. Rel Amp Segments Distance Velocity Ref. Area    ms ms mV mV %  cm m/s m/s mVms  L Median - APB     Wrist APB 4.1 ?4.4 6.3 ?4.0 100 Wrist - APB 7   17.6     Upper arm  APB 7.7  6.1  97.4 Upper arm - Wrist 18 50 ?49 16.0  R Median - APB     Wrist APB 4.9 ?4.4 3.2 ?4.0 100 Wrist - APB 7   6.7     Upper arm APB 8.5  2.7  85.4 Upper arm - Wrist 18 49 ?49 6.0  L Ulnar - ADM     Wrist ADM 2.9 ?3.3 12.5 ?6.0 100 Wrist - ADM 7   40.0     B.Elbow ADM 5.6  12.1  96.1 B.Elbow - Wrist 16 59 ?49 39.5     A.Elbow ADM 7.3  10.3  85.7 A.Elbow - B.Elbow 10 56 ?49 35.5         A.Elbow - Wrist      R Ulnar - ADM     Wrist ADM 2.6 ?3.3 13.4 ?6.0 100 Wrist - ADM 7   37.4     B.Elbow ADM 5.3  13.3  99.3 B.Elbow - Wrist 16 59 ?49 40.8     A.Elbow ADM 7.2  12.8  96.5 A.Elbow - B.Elbow 10 53 ?49 40.2         A.Elbow - Wrist                 SNC    Nerve / Sites Rec. Site Peak Lat Ref.  Amp Ref. Segments Distance    ms ms V V  cm  L Median - Orthodromic (Dig II, Mid palm)     Dig II Wrist 3.9 ?3.4 9 ?10 Dig II - Wrist 13  R Median - Orthodromic (Dig II, Mid palm)     Dig II Wrist 3.9 ?3.4 8 ?10 Dig II - Wrist 13  L Ulnar - Orthodromic, (Dig V, Mid palm)     Dig V Wrist 3.0 ?3.1 10 ?5 Dig V - Wrist 11  R Ulnar - Orthodromic, (Dig V, Mid palm)     Dig V Wrist 2.7 ?3.1 10 ?5 Dig V - Wrist 22              F  Wave    Nerve F Lat Ref.   ms ms  L Ulnar - ADM 24.3 ?32.0  R Ulnar - ADM 24.8 ?32.0         EMG full       EMG Summary Table    Spontaneous MUAP Recruitment  Muscle IA Fib PSW Fasc Other Amp Dur. Poly Pattern  L. Deltoid Normal None None None _______ Normal Normal Normal Normal  L. Triceps brachii Normal None None None _______ Normal Normal 1+ Normal  L. Biceps brachii Normal None None None _______ Normal Normal Normal Normal  L. Extensor digitorum communis Normal None None None _______ Normal Normal Normal Normal  L. First dorsal interosseous Normal None None None _______ Normal Normal Normal Normal  L. Abductor pollicis brevis Normal None None None _______ Normal Normal Normal Normal  R. First dorsal interosseous Normal None None None _______ Normal Normal  Normal Normal  R. Abductor pollicis brevis Normal None None None _______ Normal Increased 1+ Reduced

## 2017-12-21 ENCOUNTER — Encounter: Payer: Self-pay | Admitting: Internal Medicine

## 2017-12-26 NOTE — Congregational Nurse Program (Signed)
Congregational Nurse Program Note  Date of Encounter: 12/26/2017  Past Medical History: Past Medical History:  Diagnosis Date  . Neck mass 1998   Unknown biopsy results. Excised in [place].    Encounter Details:  CN office visit with interpreter [name] assisting.  Patient wanted to find out results of Nerve conduction and EMG tests.  Phone call to Dr. Tamsen Roers office.  Message left for his nurse Nevin Bloodgood to call back with results.  CN also assisted patient in completing Medicaid application. Jake Michaelis RN, Congregational Nurse [phone] CNP Questionnaire - 12/26/17 1215      Questionnaire   Patient Status  Refugee    Race  Asian    Location Patient Served At  Not Applicable    Insurance  Not Applicable    Uninsured  Uninsured (Subsequent visits/quarter)    Food  No food insecurities    Housing/Utilities  Yes, have permanent housing    Transportation  No transportation needs    Interpersonal Safety  Yes, feel physically and emotionally safe where you currently live    Medication  No medication insecurities    Medical Provider  Yes    Referrals  Medicaid    ED Visit Averted  Not Applicable    Life-Saving Intervention Made  Not Applicable

## 2017-12-28 ENCOUNTER — Encounter: Payer: Self-pay | Admitting: Internal Medicine

## 2018-01-03 ENCOUNTER — Ambulatory Visit (INDEPENDENT_AMBULATORY_CARE_PROVIDER_SITE_OTHER): Payer: Self-pay | Admitting: Family Medicine

## 2018-01-03 ENCOUNTER — Encounter: Payer: Self-pay | Admitting: Family Medicine

## 2018-01-03 DIAGNOSIS — M25531 Pain in right wrist: Secondary | ICD-10-CM

## 2018-01-03 DIAGNOSIS — M25532 Pain in left wrist: Secondary | ICD-10-CM

## 2018-01-03 NOTE — Patient Instructions (Signed)
Your nerve test confirmed this is from carpal tunnel syndrome. Take aleve 1-2 tabs twice a day with food as needed for pain and inflammation. Use the wrist braces at night and as often as possible during the day until the pain is gone. If this worsens make an appointment with me and we can do injection(s) but I don't think you're going to need this.

## 2018-01-03 NOTE — Assessment & Plan Note (Signed)
nerve conduction studies reviewed with patient showing right sided moderate carpal tunnel, mild on left.  She is much better with prednisone and wrist braces.  Will continue with the braces but also use as often as possible during the day.  Aleve if needed.  Consider injection(s) if not improving.

## 2018-01-03 NOTE — Congregational Nurse Program (Signed)
Congregational Nurse Program Note  Date of Encounter: 01/03/2018  Past Medical History: Past Medical History:  Diagnosis Date  . Neck mass 1998   Unknown biopsy results. Excised in [place].    Encounter Details:  CN accompanied patient to OV with Dr. Karlton Lemon.  He told patient that her test results confirmed that she does have carpel tunnel syndrome but that it is not severe enough to require surgery.  Recommended continued wear of wrist sprints and Aleve for pain.  If pain worsens she can return for cortisone injection.  CN picked up Aleve and delivered to patient.  Also scheduled appointment with PCP [facility]) for 01/08/2018 to recheck BP.  Jake Michaelis RN, Congregational Nurse [phone]. CNP Questionnaire - 01/03/18 1804      Questionnaire   Patient Status  Refugee    Race  Asian    Location Patient Served At  Not Applicable    Insurance  Not Applicable    Uninsured  Uninsured (Subsequent visits/quarter)    Food  No food insecurities    Housing/Utilities  Yes, have permanent housing    Transportation  No transportation needs    Interpersonal Safety  Yes, feel physically and emotionally safe where you currently live    Medication  No medication insecurities    Medical Provider  Yes    Referrals  Not Applicable    ED Visit Averted  Not Applicable    Life-Saving Intervention Made  Not Applicable

## 2018-01-03 NOTE — Progress Notes (Signed)
PCP and consultation requested by: Collier Salina, MD  Subjective:   HPI: Patient is a 67 y.o. female here for bilateral wrist pain.  6/12: Interpreter line used for visit. Patient reports she's had about 4 months of bilateral wrist pain. Left worse than right. 6/10 on left, 5/10 on right and sharp. No acute injury or trauma. No swelling. Gets some numbness into fingers, especially middle finger on left. She is right handed. About 4 years ago had similar problem and had injection that helped. No skin changes.  7/24: Interpreter present for visit. Patient reports she has improved since last visit. Left wrist up to 3/10 at worst, right 4/10 at worst. Wearing wrist braces and finished prednisone. Numbness much better. Primary problem is left 3rd digit feels like it is swollen and can't fully flex - stiff if tries to do so. Also gets stuck in flexion. No skin changes.  03/20/17: Interpreter present for visit. Patient reports doing much better after last visit. Injection helped left middle trigger finger. She has only mild soreness now of left 3rd digit with some catching - pain currently at 0/10. She is wearing wrist braces regularly at night and during work. No skin changes. Very mild numbness in left hand at times.  10/23/17: Interpreter used for visit. Patient reports she is having constant pain primarily in left hand. Associated numbness sometimes into all digits. Getting triggering of left middle finger also. Pain level 8/10 and sharp at times. No skin changes.  6/20: Interpreter line used for visit. She reports she's doing much better following prednisone dose pack and using wrist brace at night. Pain is up to 4/10 on left, 2/10 on right at worst. Not taking any medicines for this. Pain is more of a soreness. No numbness. No skin changes.  Past Medical History:  Diagnosis Date  . Neck mass 1998   Unknown biopsy results. Excised in [place].    Current  Outpatient Medications on File Prior to Visit  Medication Sig Dispense Refill  . lisinopril (PRINIVIL,ZESTRIL) 20 MG tablet Take 1 tablet (20 mg total) by mouth daily. 30 tablet 5  . predniSONE (DELTASONE) 10 MG tablet 6 tabs po day 1, 5 tabs po day 2, 4 tabs po day 3, 3 tabs po day 4, 2 tabs po day 5, 1 tab po day 6 21 tablet 0   No current facility-administered medications on file prior to visit.     Past Surgical History:  Procedure Laterality Date  . NECK SURGERY Left 1998   Excision of mass in [place]    No Known Allergies  Social History   Socioeconomic History  . Marital status: Married    Spouse name: Scientist, product/process development  . Number of children: Not on file  . Years of education: Not on file  . Highest education level: Not on file  Occupational History  . Not on file  Social Needs  . Financial resource strain: Not on file  . Food insecurity:    Worry: Not on file    Inability: Not on file  . Transportation needs:    Medical: Not on file    Non-medical: Not on file  Tobacco Use  . Smoking status: Never Smoker  . Smokeless tobacco: Never Used  Substance and Sexual Activity  . Alcohol use: No  . Drug use: No  . Sexual activity: Not on file  Lifestyle  . Physical activity:    Days per week: Not on file    Minutes per session:  Not on file  . Stress: Not on file  Relationships  . Social connections:    Talks on phone: Not on file    Gets together: Not on file    Attends religious service: Not on file    Active member of club or organization: Not on file    Attends meetings of clubs or organizations: Not on file    Relationship status: Not on file  . Intimate partner violence:    Fear of current or ex partner: Not on file    Emotionally abused: Not on file    Physically abused: Not on file    Forced sexual activity: Not on file  Other Topics Concern  . Not on file  Social History Narrative   Came to [place] from [place] in 2013.   Does not know much family  history [place].    Family History  Problem Relation Age of Onset  . Headache Son     BP (!) 154/87   Pulse 61   Ht 5' (1.524 m)   Wt 131 lb 12.8 oz (59.8 kg)   BMI 25.74 kg/m   Review of Systems: See HPI above.     Objective:  Physical Exam:  Gen: NAD, comfortable in exam room  Right wrist: No deformity, swelling, bruising, atrophy. No TTP including carpal tunnel, 1st dorsal compartment. FROM digits and wrist with 5/5 strength. NVI distally. Negative tinels and phalens.  Left wrist: No deformity, swelling, bruising, atrophy. No TTP including carpal tunnel, 1st dorsal compartment. FROM digits and wrist with 5/5 strength. NVI distally. Negative tinels and phalens.   Assessment & Plan:  1. Bilateral wrist pain - nerve conduction studies reviewed with patient showing right sided moderate carpal tunnel, mild on left.  She is much better with prednisone and wrist braces.  Will continue with the braces but also use as often as possible during the day.  Aleve if needed.  Consider injection(s) if not improving.

## 2018-01-08 ENCOUNTER — Ambulatory Visit (INDEPENDENT_AMBULATORY_CARE_PROVIDER_SITE_OTHER): Payer: Self-pay | Admitting: Internal Medicine

## 2018-01-08 ENCOUNTER — Other Ambulatory Visit: Payer: Self-pay

## 2018-01-08 ENCOUNTER — Encounter: Payer: Self-pay | Admitting: Internal Medicine

## 2018-01-08 VITALS — BP 156/83 | HR 55 | Temp 97.9°F | Ht 60.0 in | Wt 132.8 lb

## 2018-01-08 DIAGNOSIS — Z6825 Body mass index (BMI) 25.0-25.9, adult: Secondary | ICD-10-CM

## 2018-01-08 DIAGNOSIS — R7303 Prediabetes: Secondary | ICD-10-CM | POA: Insufficient documentation

## 2018-01-08 DIAGNOSIS — R739 Hyperglycemia, unspecified: Secondary | ICD-10-CM

## 2018-01-08 DIAGNOSIS — Z79899 Other long term (current) drug therapy: Secondary | ICD-10-CM

## 2018-01-08 DIAGNOSIS — I10 Essential (primary) hypertension: Secondary | ICD-10-CM

## 2018-01-08 DIAGNOSIS — E663 Overweight: Secondary | ICD-10-CM

## 2018-01-08 MED ORDER — LISINOPRIL 40 MG PO TABS: 40.0000 mg | ORAL_TABLET | Freq: Every day | ORAL | 2 refills | Status: DC

## 2018-01-08 NOTE — Progress Notes (Signed)
   CC: Elevated Blood Pressures  HPI:  [name] is a 67 y.o. female with a past medical history listed below here today with concerns of elevated blood pressures.  Patient speaks Jillian Lutz and history was obtained with the aid of a Optometrist.  She reports that she does take her lisinopril 20 mg daily at home however reports that she does forget to take it.  She says that she often forgets to take it for 1 to 2 days every week.  She tells me that she forgot to take her dose 4 days ago but has taken her medications for the past 3 days.  She reports that she has no symptoms when her blood pressure elevated.  She denies any headaches, vision changes, chest pain, shortness of breath.  She reports that she has a lot of difficulties related to take her medications.   Past Medical History:  Diagnosis Date  . Neck mass 1998   Unknown biopsy results. Excised in [place].   Review of Systems:   Negative except as noted in HPI  Physical Exam:  Vitals:   01/08/18 0932  BP: (!) 165/91  Pulse: (!) 55  Temp: 97.9 F (36.6 C)  TempSrc: Oral  SpO2: 98%  Weight: 132 lb 12.8 oz (60.2 kg)  Height: 5' (1.524 m)   GENERAL- alert, co-operative, appears as stated age, not in any distress. HEENT- Atraumatic, normocephalic CARDIAC- RRR, no murmurs, rubs or gallops. RESP- Moving equal volumes of air, and clear to auscultation bilaterally, no wheezes or crackles. EXTREMITIES- pulse 2+, symmetric, no pedal edema. SKIN- Warm, dry, No rash or lesion. PSYCH- Normal mood and affect, appropriate thought content and speech.   Assessment & Plan:   See Encounters Tab for problem based charting.  Patient discussed with Dr. Lynnae Lutz

## 2018-01-08 NOTE — Congregational Nurse Program (Signed)
Congregational Nurse Program Note  Date of Encounter: 01/08/2018  Past Medical History: Past Medical History:  Diagnosis Date  . Neck mass 1998   Unknown biopsy results. Excised in [place].    Encounter Details:  Home visit to deliver new prescription.  Patient was just seen at [facility].  Lisinopril was increased from 20 mg to 40 mg daily.  CN picked medicine up from [facility]).  Patient expresses understanding that she is to take 1 tablet daily and that she should not miss any doses.  Jake Michaelis RN, Congregational Nurse [phone]. CNP Questionnaire - 01/08/18 1807      Questionnaire   Patient Status  Refugee    Race  Asian    Location Patient Served At  Not Applicable    Insurance  Not Applicable    Uninsured  Uninsured (Subsequent visits/quarter)    Food  No food insecurities    Housing/Utilities  Yes, have permanent housing    Transportation  No transportation needs    Interpersonal Safety  Yes, feel physically and emotionally safe where you currently live    Medication  No medication insecurities    Medical Provider  Yes    Referrals  Not Applicable    ED Visit Averted  Not Applicable    Life-Saving Intervention Made  Not Applicable      Clinical Intake - 01/08/18 0931      Pre-visit preparation   Pre-visit preparation completed  Yes      Pain   Pain   No/denies pain      Nutrition Screen   Diabetes  No      Functional Status   Activities of Daily Living  Independent    Ambulation  Independent    Medication Administration  Independent    Home Management  Independent      Risk/Barriers   Barriers to Care Management & Learning  None      Abuse/Neglect   Do you feel unsafe in your current relationship?  No    Do you feel physically threatened by others?  No    Anyone hurting you at home, work, or school?  No    Unable to ask?  No      Patient Literacy   How often do you need to have someone help you when you read  instructions, pamphlets, or other written materials from your doctor or pharmacy?  5 - Always    What is the last grade level you completed in school?  5TH GRADE      Investment banker, operational Needed?  No      Comments   Information entered by :  [place], [place] 01/08/2018 9:32PM

## 2018-01-08 NOTE — Assessment & Plan Note (Signed)
BP Readings from Last 3 Encounters:  01/08/18 (!) 156/83  01/03/18 (!) 154/87  10/23/17 (!) 154/91    Lab Results  Component Value Date   NA 140 09/28/2017   K 4.2 09/28/2017   CREATININE 0.70 09/28/2017   See HPI for full details  Plan: She is demonstrated in the past that when she takes her medications her blood pressure is fairly well controlled.  However, her blood pressure remains elevated today even on recheck and she reports she has been taking it for the past 3 days.  I will increase her lisinopril to 40 mg daily today as I do not think she can remember to take a second agent though adding a second agent may give better blood pressure control.  We spent a long time discussing strategies for remembering to take her medications we provide her with a pillbox today.  She will return in 4 to 6 weeks for blood pressure recheck and we can recheck labs at that time.

## 2018-01-08 NOTE — Patient Instructions (Signed)
Ms. Sieloff,  It was a pleasure meeting with you today.  I recommend trying to implement some of the strategies we discussed about remembering to take your medications.  When to increase the dose of your lisinopril from 20 mg to 40 mg.  I would like you to come back to see [place] in 4 to 6 weeks for a recheck.

## 2018-01-08 NOTE — Assessment & Plan Note (Signed)
On review of her labs Dr. Lynnae January noted elevated random blood sugars.  Given her being overweight with some elevated blood sugars it may be prudent to check an A1c at follow-up visit for further evaluation.

## 2018-01-09 NOTE — Progress Notes (Signed)
Internal Medicine Clinic Attending  Case discussed with Dr. Boswell at the time of the visit.  We reviewed the resident's history and exam and pertinent patient test results.  I agree with the assessment, diagnosis, and plan of care documented in the resident's note.  

## 2018-02-05 ENCOUNTER — Ambulatory Visit: Payer: Self-pay

## 2018-02-06 ENCOUNTER — Encounter: Payer: Self-pay | Admitting: *Deleted

## 2018-02-12 ENCOUNTER — Other Ambulatory Visit: Payer: Self-pay

## 2018-02-12 ENCOUNTER — Encounter: Payer: Self-pay | Admitting: Internal Medicine

## 2018-02-12 ENCOUNTER — Ambulatory Visit: Payer: Self-pay

## 2018-02-12 ENCOUNTER — Ambulatory Visit (INDEPENDENT_AMBULATORY_CARE_PROVIDER_SITE_OTHER): Payer: Self-pay | Admitting: Internal Medicine

## 2018-02-12 DIAGNOSIS — F329 Major depressive disorder, single episode, unspecified: Secondary | ICD-10-CM

## 2018-02-12 DIAGNOSIS — I10 Essential (primary) hypertension: Secondary | ICD-10-CM

## 2018-02-12 DIAGNOSIS — Z79899 Other long term (current) drug therapy: Secondary | ICD-10-CM

## 2018-02-12 DIAGNOSIS — G5600 Carpal tunnel syndrome, unspecified upper limb: Secondary | ICD-10-CM

## 2018-02-12 DIAGNOSIS — M6530 Trigger finger, unspecified finger: Secondary | ICD-10-CM

## 2018-02-12 MED ORDER — LISINOPRIL-HYDROCHLOROTHIAZIDE 20-12.5 MG PO TABS: 1.0000 | ORAL_TABLET | Freq: Every day | ORAL | 3 refills | Status: DC

## 2018-02-12 MED FILL — LISINOPRIL-HCTZ 20-12.5 MG: 20-12.5 | 30 days supply | Qty: 30 | Fill #0

## 2018-02-12 NOTE — Congregational Nurse Program (Signed)
Congregational Nurse Program Note  Date of Encounter: 02/12/2018  Past Medical History: Past Medical History:  Diagnosis Date  . Neck mass 1998   Unknown biopsy results. Excised in [place].    Encounter Details:  Home visit to deliver new medication prescribed today at [facility].  Also picked up information requested by her Medicaid case worker and dropped it off at [facility].  Jake Michaelis RN, Congregational Nurse [phone] CNP Questionnaire - 02/12/18 2110      Questionnaire   Patient Status  Refugee    Race  Asian    Location Patient Served At  Not Applicable    Insurance  Not Applicable    Uninsured  Uninsured (Subsequent visits/quarter)    Food  No food insecurities    Housing/Utilities  Yes, have permanent housing    Transportation  No transportation needs    Interpersonal Safety  Yes, feel physically and emotionally safe where you currently live    Medication  No medication insecurities    Medical Provider  Yes    Referrals  Area Agency;Medicaid    ED Visit Averted  Not Applicable    Life-Saving Intervention Made  Not Applicable      Clinical Intake - 02/12/18 1047      Pre-visit preparation   Pre-visit preparation completed  Yes      Pain   Pain   No/denies pain      Nutrition Screen   BMI - recorded  25.76    Nutritional Status  BMI 25 -29 Overweight    Nutritional Risks  None    Diabetes  No      Functional Status   Activities of Daily Living  Independent    Ambulation  Independent    Medication Administration  Independent    Home Management  Independent      Risk/Barriers   Barriers to Care Management & Learning  Language Carola Frost Interpreter      Abuse/Neglect   Do you feel unsafe in your current relationship?  No    Do you feel physically threatened by others?  No    Anyone hurting you at home, work, or school?  No    Unable to ask?  No    Information provided on Community resources  No      Patient  Literacy   How often do you need to have someone help you when you read instructions, pamphlets, or other written materials from your doctor or pharmacy?  5 - Always    What is the last grade level you completed in school?  5th Grade       Language Assistant   Interpreter Needed?  Yes    Interpreter Agency  [facility]     Interpreter Name  [place]     Patient Declined Interpreter   No    Patient signed [place] waiver  No      Comments   Information entered by :  Sander Nephew, RN 02/12/2018 10:49 AM

## 2018-02-12 NOTE — Assessment & Plan Note (Addendum)
Continues to be uncontrolled in clinic.  Endorses adherence to lisinopril 40 mg daily.  She has not forgotten to take any dose.  Patient does check it at home and recalls home SBPs 125-135, Does not recall diastolic pressures.  She denies feeling stressed or anxious in the clinic.  Does endorse occasional headaches.  Denies chest pain, shortness of breath.  Her orange card has expired and she is working on renewing it or applying for [employer]  Plan: - Finish current bottle of lisinopril 40 mg, then start taking lisinopril HCTZ combo 20-12.5 mg -Discussed getting BMP today to check renal function after increasing dose of lisinopril but patient declined due to lack of insurance. -Instructed patient to record home blood pressure measurements and bring log book to next visit -Follow-up in 6 weeks for blood pressure recheck and BMP if patient can afford it

## 2018-02-12 NOTE — Patient Instructions (Addendum)
It was nice seeing you today. Thank you for choosing [facility] for your Primary Care.   Today we talked about:   1) High blood pressure:   You blood pressure continues to be high in the clinic. Continue taking your current medicine (Lisinopril 40mg  daily) and after you run out of that medicine I would like to switch you to a combination medicine (one pill with two different medicines in it). People normally get a better response with this combination pill.   You can pick up this combination medicine at the [facility].   Keep checking your blood pressures at home and write them down and bring them to your next appointment so we can review. Sometimes your blood pressure can be higher at the doctor's office than it is at home.   FOLLOW-UP INSTRUCTIONS When: 6 weeks For: blood pressure What to bring: all medications  Please contact the clinic if you have any problems, or need to be seen sooner.

## 2018-02-12 NOTE — Progress Notes (Signed)
   CC: High blood pressure  HPI:  Ms.Keyshla Natividad is a 67 y.o. female with hypertension, carpal tunnel, trigger finger, depression who presents for follow-up of high blood pressure.  Please see encounter tab for full details of HPI.  Past Medical History:  Diagnosis Date  . Neck mass 1998   Unknown biopsy results. Excised in [place].    Physical Exam:  Vitals:   02/12/18 1046  BP: (!) 155/79  Pulse: (!) 56  Temp: 98.3 F (36.8 C)  TempSrc: Oral  SpO2: 99%  Weight: 131 lb 14.4 oz (59.8 kg)  Height: 5' (1.524 m)   Gen: Well appearing, NAD CV: bradycardic, regular rhythm, no murmurs Pulm: Normal effort, CTA throughout, no wheezing Ext: Warm, no edema    Assessment & Plan:   See Encounters Tab for problem based charting.  Patient seen with Dr. Rebeca Alert

## 2018-02-12 NOTE — Progress Notes (Signed)
Internal Medicine Clinic Attending  I saw and evaluated the patient.  I personally confirmed the key portions of the history and exam documented by Dr. Vogel and I reviewed pertinent patient test results.  The assessment, diagnosis, and plan were formulated together and I agree with the documentation in the resident's note.  Alexander Raines, M.D., Ph.D.  

## 2018-03-27 NOTE — Congregational Nurse Program (Signed)
CN office visit with interpreter [name] interpreting.  BP 124/82.  Advised patient of appointment with Bonna Gains, Financial counselor at [facility] on 04/12/2018 at 2:00 pm and gave her list of documents needed for that appointment. Jake Michaelis RN, Congregational Nurse [phone].

## 2018-04-12 ENCOUNTER — Ambulatory Visit: Payer: Self-pay

## 2018-05-15 NOTE — Congregational Nurse Program (Signed)
CN office visit with interpreter [name] assisting.  Called [facility] billing department and helped her pay bill by phone.  Also referred her to [facility] sign-up on May 17, 2018.  Jake Michaelis RN, Congregational Nurse [phone]

## 2018-05-17 LAB — GLUCOSE, POCT (MANUAL RESULT ENTRY): POC Glucose: 103 mg/dl — AB (ref 70–99)

## 2018-05-22 NOTE — Congregational Nurse Program (Signed)
CN office visit with interpreter [name].  Helped patient obtain necessary documents for [facility] Eligibility  Appointment on 05/31/2018.  Jake Michaelis RN, Congregational Nurse [phone].

## 2018-05-31 ENCOUNTER — Ambulatory Visit: Payer: Self-pay

## 2018-06-10 ENCOUNTER — Ambulatory Visit: Payer: Self-pay

## 2018-08-07 ENCOUNTER — Telehealth: Payer: Self-pay

## 2018-08-07 NOTE — Telephone Encounter (Signed)
Phone call to patient with interpreter [name] assisting.  We called to let her know that she is past due for a BP follow-up with her PCP at [facility].  She states she is still taking BP medicine daily and feels good.  She asked if we would assist her in making appointment.  CN called and scheduled her for 08/15/2018 at 9:30 am.  States she will find someone to take her.  Jake Michaelis RN, Congregational Nurse [phone]

## 2018-08-15 ENCOUNTER — Ambulatory Visit: Payer: Self-pay | Admitting: Internal Medicine

## 2018-08-15 VITALS — BP 140/77 | HR 54 | Temp 97.5°F | Ht <= 58 in | Wt 128.8 lb

## 2018-08-15 DIAGNOSIS — R42 Dizziness and giddiness: Secondary | ICD-10-CM

## 2018-08-15 DIAGNOSIS — R300 Dysuria: Secondary | ICD-10-CM

## 2018-08-15 DIAGNOSIS — Z79899 Other long term (current) drug therapy: Secondary | ICD-10-CM

## 2018-08-15 DIAGNOSIS — R200 Anesthesia of skin: Secondary | ICD-10-CM

## 2018-08-15 DIAGNOSIS — R202 Paresthesia of skin: Secondary | ICD-10-CM

## 2018-08-15 DIAGNOSIS — R3915 Urgency of urination: Secondary | ICD-10-CM

## 2018-08-15 DIAGNOSIS — I10 Essential (primary) hypertension: Secondary | ICD-10-CM

## 2018-08-15 MED ORDER — AMOXICILLIN 500 MG PO TABS
500.0000 mg | ORAL_TABLET | Freq: Three times a day (TID) | ORAL | 0 refills | Status: DC
Start: 2018-08-15 — End: 2018-08-15

## 2018-08-15 MED ORDER — AMOXICILLIN 500 MG PO TABS: 500.0000 mg | ORAL_TABLET | Freq: Three times a day (TID) | ORAL | 0 refills | Status: DC

## 2018-08-15 MED ORDER — LISINOPRIL-HYDROCHLOROTHIAZIDE 20-12.5 MG PO TABS: 2.0000 | ORAL_TABLET | Freq: Every day | ORAL | 3 refills | Status: DC

## 2018-08-15 MED FILL — LISINOPRIL-HCTZ 20-12.5 MG: 20-12.5 | 15 days supply | Qty: 30 | Fill #0

## 2018-08-15 MED FILL — AMOXICILLIN 500 MG CAPSULE: 500 | 5 days supply | Qty: 15 | Fill #0

## 2018-08-15 NOTE — Assessment & Plan Note (Addendum)
The patient's blood pressure during this visit was 140/77.  She was prescribed lisinopril-HCTZ 20-12.5 mg daily, how ever she believes she was told to take 2 tablets daily and have done so. his last blood pressure visits are  BP Readings from Last 3 Encounters:  08/15/18 140/77  05/17/18 (!) 148/88  03/27/18 124/82   She doe not have her BP log book with her today but mentions that the SBPs has been between 135-145. (She had dizziness with reading at SBP 145.) Recommended to bring the BP log book next time. Also recommended to bring her medications to clinic next time..  Although the BP target for her is 130/80, I won't increase Lisinopril-HCTZ more than 2 tab/day, since not sure if she is taking 1 or 2 tab daily. -Lisinopril-HCTZ 20-12.5 mg, 2 tablets QD -BMP--> Unremarkable

## 2018-08-15 NOTE — Patient Instructions (Addendum)
Thank you for allowing [place] to provide your care today. Today we discussed your blood pressure, your arms and legs tingling and your urinary symptoms. Your blood today is 144/77.  Please take 2 tablets of lisinopril-hydrochlorothiazide daily.     Please continue recording her blood pressure at home and bring the device with you next time. You have symptoms of recurrent urinary tract infection.  I start antibiotic for you. I have ordered some blood test for you I will call if any are abnormal.   I order a neck X ray due to numbness and tingling, will call you if abnormal.  Please follow-up in 3-6 months or sooner if no improvement in your urinary symptoms in a week.  Should you have any questions or concerns please call the internal medicine clinic at [phone].    Thank you

## 2018-08-16 LAB — URINALYSIS, ROUTINE W REFLEX MICROSCOPIC
Bilirubin, UA: NEGATIVE
GLUCOSE, UA: NEGATIVE
Ketones, UA: NEGATIVE
Leukocytes, UA: NEGATIVE
NITRITE UA: NEGATIVE
Protein, UA: NEGATIVE
RBC, UA: NEGATIVE
Specific Gravity, UA: 1.024 (ref 1.005–1.030)
Urobilinogen, Ur: 0.2 mg/dL (ref 0.2–1.0)
pH, UA: 7.5 (ref 5.0–7.5)

## 2018-08-16 LAB — BMP8+ANION GAP
Anion Gap: 15 mmol/L (ref 10.0–18.0)
BUN / CREAT RATIO: 14 (ref 12–28)
BUN: 11 mg/dL (ref 8–27)
CALCIUM: 9 mg/dL (ref 8.7–10.3)
CHLORIDE: 105 mmol/L (ref 96–106)
CO2: 23 mmol/L (ref 20–29)
CREATININE: 0.77 mg/dL (ref 0.57–1.00)
GFR, EST AFRICAN AMERICAN: 92 mL/min/{1.73_m2} (ref >59)
GFR, EST NON AFRICAN AMERICAN: 80 mL/min/{1.73_m2} (ref >59)
Glucose: 110 mg/dL — ABNORMAL HIGH (ref 65–99)
Potassium: 4.4 mmol/L (ref 3.5–5.2)
Sodium: 143 mmol/L (ref 134–144)

## 2018-08-17 DIAGNOSIS — R200 Anesthesia of skin: Secondary | ICD-10-CM | POA: Insufficient documentation

## 2018-08-17 DIAGNOSIS — R202 Paresthesia of skin: Secondary | ICD-10-CM

## 2018-08-17 NOTE — Assessment & Plan Note (Addendum)
She complains of tingling and aching of legs and arms. Mostly happens after work and mostly the days that she works long hours. No sensory or motor deficit on exam. Her symptoms are not specificare not consistent during the day. Less likely to be electrolyte abnormality and BMP came back nl. Since is worse in upper extremities, can also consider degenerative disk disorders and bilateral cervical radiculopathy. She declines cervical X ray. May consider vit B12 def if worsened. -BMP-->No electrolytes abnormality -F/u in clinic if worsen

## 2018-08-17 NOTE — Progress Notes (Signed)
   CC: blood pressure follow up  HPI:  [name] is a 68 y.o. female with past medical history listed below, comes to clinic today for follow-up of hypertension, also complains of some bilateral legs and arms ache.  Past Medical History:  Diagnosis Date  . Neck mass 1998   Unknown biopsy results. Excised in [place].   Review of Systems:  Review of Systems  Constitutional: Negative for chills and fever.  Genitourinary: Positive for dysuria and urgency. Negative for frequency.  Neurological: Positive for dizziness and tingling. Negative for sensory change, focal weakness and weakness.     Physical Exam:  Vitals:   08/15/18 0950  BP: 140/77  Pulse: (!) 54  Temp: (!) 97.5 F (36.4 C)  TempSrc: Oral  SpO2: 100%  Weight: 128 lb 12.8 oz (58.4 kg)  Height: 4\' 10"  (1.473 m)   Physical Exam Vitals signs reviewed.  Constitutional:      Appearance: Normal appearance.  HENT:     Head: Normocephalic and atraumatic.     Mouth/Throat:     Mouth: Mucous membranes are dry.  Eyes:     Extraocular Movements: Extraocular movements intact.  Cardiovascular:     Rate and Rhythm: Normal rate and regular rhythm.     Pulses: Normal pulses.     Heart sounds: Normal heart sounds. No murmur.  Pulmonary:     Effort: Pulmonary effort is normal.     Breath sounds: Normal breath sounds. No wheezing or rales.  Abdominal:     General: There is no distension.     Palpations: Abdomen is soft.     Tenderness: There is no abdominal tenderness.  Musculoskeletal:        General: No tenderness.     Right lower leg: No edema.     Left lower leg: No edema.  Neurological:     General: No focal deficit present.     Mental Status: She is alert and oriented to person, place, and time.     Sensory: No sensory deficit.     Motor: No weakness.  Psychiatric:        Mood and Affect: Mood normal.        Behavior: Behavior normal.     Assessment & Plan:   See Encounters Tab for problem based  charting.  Patient discussed with Dr. Beryle Beams

## 2018-08-17 NOTE — Assessment & Plan Note (Signed)
She reports dysuria and urgency. No fever or chills and no Abdominal or flank pain. Will check urine for UTI today. -U/A

## 2018-08-18 NOTE — Progress Notes (Signed)
Medicine attending: Medical history, presenting problems, physical findings, and medications, reviewed with resident physician Dr Eli Masoudi on the day of the patient visit and I concur with her evaluation and management plan. 

## 2018-09-05 MED FILL — LISINOPRIL-HCTZ 20-12.5 MG: 20-12.5 | 30 days supply | Qty: 30 | Fill #1

## 2019-01-02 ENCOUNTER — Other Ambulatory Visit: Payer: Self-pay | Admitting: Hematology

## 2019-01-02 DIAGNOSIS — Z20822 Contact with and (suspected) exposure to covid-19: Secondary | ICD-10-CM

## 2019-01-02 NOTE — Progress Notes (Signed)
Special request for testing d/t to [place] /

## 2019-01-03 ENCOUNTER — Other Ambulatory Visit: Payer: Self-pay

## 2019-01-03 DIAGNOSIS — Z20822 Contact with and (suspected) exposure to covid-19: Secondary | ICD-10-CM

## 2019-01-08 LAB — NOVEL CORONAVIRUS, NAA: SARS-CoV-2, NAA: NOT DETECTED

## 2019-02-05 MED FILL — LISINOPRIL-HCTZ 20-12.5 MG: 20-12.5 | 30 days supply | Qty: 30 | Fill #2

## 2019-02-05 NOTE — Congregational Nurse Program (Signed)
Home visit with interpreter [facility].  Patient only has 3 Lisinopril pills left and says she does not have transportation to get it.  CN called in refill request to [facility] out-patient pharmacy and picked it up for patient.  No more refills.  Called [facility] and she is due for a follow-up appointment.  Scheduled for February 26, 2019 at 9:15 am.  Also made appointment for financial counseling to coincide with he husband's on February 12, 2019 at 10:00 am.  Reminded her what to take to appointment and wrote note for employer to give her wage information for the past 3 months .Jake Michaelis RN, Congregational Nurse [phone].

## 2019-02-12 ENCOUNTER — Encounter: Payer: Self-pay | Admitting: *Deleted

## 2019-02-26 ENCOUNTER — Other Ambulatory Visit: Payer: Self-pay

## 2019-02-26 ENCOUNTER — Encounter: Payer: Self-pay | Admitting: Internal Medicine

## 2019-02-26 ENCOUNTER — Ambulatory Visit (INDEPENDENT_AMBULATORY_CARE_PROVIDER_SITE_OTHER): Payer: Self-pay | Admitting: Internal Medicine

## 2019-02-26 VITALS — BP 127/79 | HR 63 | Temp 98.8°F | Ht <= 58 in | Wt 124.6 lb

## 2019-02-26 DIAGNOSIS — Z23 Encounter for immunization: Secondary | ICD-10-CM

## 2019-02-26 DIAGNOSIS — Z13820 Encounter for screening for osteoporosis: Secondary | ICD-10-CM

## 2019-02-26 DIAGNOSIS — Z0000 Encounter for general adult medical examination without abnormal findings: Secondary | ICD-10-CM

## 2019-02-26 DIAGNOSIS — I10 Essential (primary) hypertension: Secondary | ICD-10-CM

## 2019-02-26 DIAGNOSIS — Z79899 Other long term (current) drug therapy: Secondary | ICD-10-CM

## 2019-02-26 MED ORDER — LISINOPRIL-HYDROCHLOROTHIAZIDE 20-12.5 MG PO TABS: 2.0000 | ORAL_TABLET | Freq: Every day | ORAL | 3 refills | Status: DC

## 2019-02-26 MED FILL — LISINOPRIL-HCTZ 20-12.5 MG: 20-12.5 | 15 days supply | Qty: 30 | Fill #0

## 2019-02-26 NOTE — Assessment & Plan Note (Signed)
Hypertension: Overall, Jillian Lutz reports that she is doing very well and states that she "feels normal.  "She was last examined by Dr. Sydnee Levans  in January 2020 and was noted to have an elevated blood pressure for which her antihypertensive was increased.  She has been tolerating the increased dose of her blood pressure medicine and denies any cardiopulmonary process.  BP Readings from Last 3 Encounters:  02/26/19 127/79  08/15/18 140/77  05/17/18 (!) 148/88   Plan: -Continue lisinopril-HCTZ 20-12.5 mg (2 tabs daily). -We will defer obtaining BMP as this was recently performed.

## 2019-02-26 NOTE — Assessment & Plan Note (Signed)
Health maintenance: She is due for several health screening examinations including a mammogram, colonoscopy, DEXA scan, Pneumovax 23 and tetanus.  She states she has acquired the orange card and will thus be able to have these performed.

## 2019-02-26 NOTE — Patient Instructions (Signed)
*  Translated using an in person Solicitor*  Ms. Garritano,   It was a pleasure taking care of you at the clinic today.  Overall, it seems you are doing well and also taking good care of yourself.  As we discussed, I would like to get some screening imaging test on you including mammogram, colonoscopy and a bone scan.  I am glad that you want to get the pneumonia and tetanus shot today.  Take care! Dr. Eileen Stanford  Please call the internal medicine center clinic if you have any questions or concerns, we may be able to help and keep you from a long and expensive emergency room wait. Our clinic and after hours phone number is [phone], the best time to call is Monday through Friday 9 am to 4 pm but there is always someone available 24/7 if you have an emergency. If you need medication refills please notify your pharmacy one week in advance and they will send [place] a request.

## 2019-02-26 NOTE — Progress Notes (Signed)
   CC: Routine follow-up, follow-up hypertension.  HPI:  [name] is a 68 y.o. very pleasant [place] speaking woman here with an interpreter Biomedical scientist) for routine visit.   [name] states that she is doing well and currently feels normal.  She states that she has a good appetite.  She denies diarrhea, halos, diaphoresis, heat or cold intolerance, cigarette use, alcohol use, illicit drug use, hematochezia.  She immigrated from [place] in 2014 with her husband who is a Theme park manager (retired).  On further discussion, she states that she had several immunizations performed in [place] however we do not have any records for that.  She is agreeable to getting her Pneumovax 23 and tetanus vaccine today.  Please see problem based charting for further details.  Past Medical History:  Diagnosis Date  . Neck mass 1998   Unknown biopsy results. Excised in [place].  . Trigger finger, acquired 02/08/2017   Review of Systems:  As per HPI  Physical Exam:  Vitals:   02/26/19 0917  BP: 127/79  Pulse: 63  Temp: 98.8 F (37.1 C)  TempSrc: Oral  SpO2: 97%  Weight: 124 lb 9.6 oz (56.5 kg)  Height: 4\' 10"  (1.473 m)   Physical Exam  Constitutional: She is well-developed, well-nourished, and in no distress.  HENT:  Head: Normocephalic and atraumatic.  Eyes: Conjunctivae are normal.  Neck: Normal range of motion. Neck supple. No thyromegaly present.  Cardiovascular: Normal rate, regular rhythm and normal heart sounds.  No murmur heard. Pulmonary/Chest: Effort normal and breath sounds normal. No respiratory distress. She has no wheezes. She has no rales.  Musculoskeletal: Normal range of motion.        General: No tenderness, deformity or edema.  Neurological: Gait normal.  Psychiatric: Mood and affect normal.    Assessment & Plan:   See Encounters Tab for problem based charting.  Patient discussed with Dr. Dareen Piano

## 2019-02-27 NOTE — Addendum Note (Signed)
Addended by: Yvonna Alanis E on: 02/27/2019 11:45 AM   Modules accepted: Orders

## 2019-02-27 NOTE — Progress Notes (Signed)
Internal Medicine Clinic Attending  Case discussed with Dr. Agyei at the time of the visit.  We reviewed the resident's history and exam and pertinent patient test results.  I agree with the assessment, diagnosis, and plan of care documented in the resident's note.    

## 2019-03-04 NOTE — Addendum Note (Signed)
Addended by: [name] on: 03/04/2019 08:35 AM   Modules accepted: Orders

## 2019-05-12 MED FILL — LISINOPRIL-HCTZ 20-12.5 MG: 20-12.5 | 15 days supply | Qty: 30 | Fill #0

## 2019-05-14 ENCOUNTER — Other Ambulatory Visit: Payer: Self-pay | Admitting: Internal Medicine

## 2019-05-14 ENCOUNTER — Other Ambulatory Visit: Payer: Self-pay | Admitting: *Deleted

## 2019-05-14 DIAGNOSIS — I10 Essential (primary) hypertension: Secondary | ICD-10-CM

## 2019-05-14 MED ORDER — LISINOPRIL-HYDROCHLOROTHIAZIDE 20-12.5 MG PO TABS: 2.0000 | ORAL_TABLET | Freq: Every day | ORAL | 3 refills | Status: DC

## 2019-05-14 NOTE — Telephone Encounter (Signed)
Thanks

## 2019-05-14 NOTE — Telephone Encounter (Signed)
Lisinopril-HCTZ 20-12.5 mg #30 with 3 refills sent 02/26/2019. Patient taking 2 tabs daily. Please send for a qty 60 tabs for 30 day supply. Thanks!

## 2019-05-15 MED ORDER — LISINOPRIL-HYDROCHLOROTHIAZIDE 20-12.5 MG PO TABS: 2.0000 | ORAL_TABLET | Freq: Every day | ORAL | 3 refills | Status: DC

## 2019-05-15 MED FILL — LISINOPRIL-HCTZ 20-12.5 MG: 20-12.5 | 30 days supply | Qty: 60 | Fill #0

## 2019-06-04 ENCOUNTER — Telehealth: Payer: Self-pay

## 2019-06-04 NOTE — Telephone Encounter (Signed)
Patient called interpreter [name] to state she needs to see an eye doctor.  She went to [facility] to get drivers license and failed vision screening.  She has a form that must be signed by an eye doctor before she can get her license.  She has no insurance and needs help making an appointment.  CN will contact [facility] regarding assisting patient.  Jake Michaelis  RN, Congregational Nurse [phone]

## 2019-06-06 ENCOUNTER — Telehealth: Payer: Self-pay

## 2019-06-06 NOTE — Telephone Encounter (Signed)
Patient called regarding eye doctor appointment required by [facility] DMV.  Advised payient that I had spoken with [place] member Charolotte Capuchin and he agreed to help her because she is uninsured.  He will contact Dr. Irven Shelling office and CN will call on 06/09/2019 to schedule appointment.  She will pay a $25 copay.  Jake Michaelis RN, Congregational Nurse [phone]

## 2019-06-09 ENCOUNTER — Telehealth: Payer: Self-pay

## 2019-06-09 NOTE — Telephone Encounter (Signed)
Called patient to tell her she has an eye doctor appointment with Dr. Webb Laws on 07/15/2019 at 10:30 am.  Jake Michaelis RN, Congregational Nurse [phone]

## 2019-06-16 MED FILL — LISINOPRIL-HCTZ 20-12.5 MG: 20-12.5 | 30 days supply | Qty: 60 | Fill #0

## 2019-06-16 NOTE — Congregational Nurse Program (Signed)
Patient called interpreter [name] to state she will be out of medication in a few days and has no one to help her get it.  CN picked up Lisinopril/HCTZ at [facility] and took it to her home.  Jake Michaelis RN, Congregational Nurse [phone]

## 2019-07-14 MED FILL — LISINOPRIL-HCTZ 20-12.5 MG: 20-12.5 | 30 days supply | Qty: 60 | Fill #1

## 2019-07-16 NOTE — Congregational Nurse Program (Signed)
Accompanied patient to eye doctor appointment which was funded by the [facility].  Patient was seen by Dr. Webb Laws.  He stated that patient has cataracts on both eyes but worse in right eye and that she needs surgery.  She has no insurance and cannot afford to pay for the surgery.  Present plan is to prescribe glasses which can vastly improve her vision but the best correction in the right eye for distance is 20/70.  States she can read without difficulty.  He also completed a form for DMV stating he prescribed glasses which were ordered today and should be ready in about 10 days.Jake Michaelis RN, Congregational Nurse [phone]

## 2019-08-26 MED FILL — LISINOPRIL-HCTZ 20-12.5 MG: 20-12.5 | 30 days supply | Qty: 60 | Fill #2

## 2019-09-03 ENCOUNTER — Ambulatory Visit: Payer: Self-pay

## 2019-09-29 ENCOUNTER — Ambulatory Visit (INDEPENDENT_AMBULATORY_CARE_PROVIDER_SITE_OTHER): Payer: Self-pay | Admitting: Internal Medicine

## 2019-09-29 ENCOUNTER — Encounter: Payer: Self-pay | Admitting: Internal Medicine

## 2019-09-29 ENCOUNTER — Other Ambulatory Visit: Payer: Self-pay

## 2019-09-29 VITALS — BP 145/79 | HR 60 | Temp 98.1°F | Wt 119.0 lb

## 2019-09-29 DIAGNOSIS — R202 Paresthesia of skin: Secondary | ICD-10-CM | POA: Insufficient documentation

## 2019-09-29 DIAGNOSIS — I10 Essential (primary) hypertension: Secondary | ICD-10-CM

## 2019-09-29 DIAGNOSIS — G5603 Carpal tunnel syndrome, bilateral upper limbs: Secondary | ICD-10-CM

## 2019-09-29 DIAGNOSIS — Z79899 Other long term (current) drug therapy: Secondary | ICD-10-CM

## 2019-09-29 NOTE — Assessment & Plan Note (Addendum)
Lower extremity paresthesia: She states that for the past several months she has had sensation of feeling "hot "in her legs.  She denies numbness or tingling at the lower extremities.  She denies polyurea but does endorse polydipsia.  On examination, her sensations were intact using microfilament.  She had normal vibratory sensation.  Per chart review, she did have an episode of hyperglycemia to the 140s in the past  Plan: -Follow-up A1c, BMP

## 2019-09-29 NOTE — Patient Instructions (Signed)
Jillian Lutz,   Thanks for coming in today.  Overall you are doing well, I am getting some labs today.  In regards to your blood pressure medicine, increase to 2 tablets a day.  I am getting some blood work and will also refer you to sports medicine for the carpal tunnel.  Take care!

## 2019-09-29 NOTE — Assessment & Plan Note (Signed)
Hypertension: Not at goal.  She has a log of her home BP recordings with her and is ranging in the 130s-150s/80s-90s.  BP Readings from Last 3 Encounters:  09/29/19 (!) 145/79  02/26/19 127/79  08/15/18 140/77    Assessment/plan: Unfortunately there was some miscommunication with her antihypertensive regimen.  She was only taking 1 pill instead of 2 pills a day.  Take combination lisinopril-HCTZ 20-12.5 mg (2 pills daily)

## 2019-09-29 NOTE — Progress Notes (Signed)
Internal Medicine Clinic Attending  Case discussed with Dr. Agyei at the time of the visit.  We reviewed the resident's history and exam and pertinent patient test results.  I agree with the assessment, diagnosis, and plan of care documented in the resident's note.    

## 2019-09-29 NOTE — Progress Notes (Signed)
.     CC: Follow-up hypertension.  HPI:  [name] is a 69 y.o. very pleasant [place] speaking woman with medical history significant for hypertension who is presenting to follow-up on chronic medical problems.  Please see problem based charting for further details.  Past Medical History:  Diagnosis Date  . Bilateral wrist pain 10/25/2017  . Neck mass 1998   Unknown biopsy results. Excised in [place].  . Trigger finger, acquired 02/08/2017   Review of Systems: As per HPI  Physical Exam:  Vitals:   09/29/19 1331  BP: (!) 145/79  Pulse: 60  Temp: 98.1 F (36.7 C)  TempSrc: Oral  Weight: 119 lb (54 kg)   Physical Exam  Constitutional: She is well-developed, well-nourished, and in no distress. No distress.  Cardiovascular: Normal rate, regular rhythm and normal heart sounds.  Pulmonary/Chest: Effort normal and breath sounds normal. No respiratory distress. She has no wheezes.  Neurological: No sensory deficit.  1.  Normal sensation to pinprick 2.  Normal vibratory sensation   Skin: She is not diaphoretic.    Assessment & Plan:   See Encounters Tab for problem based charting.  Patient discussed with Dr. Evette Doffing

## 2019-09-29 NOTE — Assessment & Plan Note (Signed)
Carpal tunnel syndrome: She states that recently she has began experiencing numbness and tingling of her left fingers that is especially worse at the middle finger.  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  She requests to be evaluated by Dr. Barbaraann Barthel who was seen her in the past.  Plan: -Given referral to sports medicine, Dr. Barbaraann Barthel -Advised to continue using wrist splints

## 2019-09-30 LAB — BMP8+ANION GAP
Anion Gap: 14 mmol/L (ref 10.0–18.0)
BUN/Creatinine Ratio: 15 (ref 12–28)
BUN: 12 mg/dL (ref 8–27)
CO2: 27 mmol/L (ref 20–29)
Calcium: 9.9 mg/dL (ref 8.7–10.3)
Chloride: 98 mmol/L (ref 96–106)
Creatinine, Ser: 0.8 mg/dL (ref 0.57–1.00)
GFR calc Af Amer: 88 mL/min/{1.73_m2} (ref >59)
GFR calc non Af Amer: 76 mL/min/{1.73_m2} (ref >59)
Glucose: 124 mg/dL — ABNORMAL HIGH (ref 65–99)
Potassium: 3.6 mmol/L (ref 3.5–5.2)
Sodium: 139 mmol/L (ref 134–144)

## 2019-09-30 LAB — HEMOGLOBIN A1C
Est. average glucose Bld gHb Est-mCnc: 117 mg/dL
Hgb A1c MFr Bld: 5.7 % — ABNORMAL HIGH (ref 4.8–5.6)

## 2019-10-02 ENCOUNTER — Telehealth: Payer: Self-pay

## 2019-10-02 MED FILL — LISINOPRIL-HCTZ 20-12.5 MG: 20-12.5 | 30 days supply | Qty: 60 | Fill #3

## 2019-10-02 NOTE — Telephone Encounter (Signed)
Phone call to patient via interpreter Jillian Lutz in response to call from Dr. Eileen Stanford to CN office.  We advised patient that all her labwork was normal except her blood glucose and HbA1C and that she is considered pre-diabetic.  She expressed surprise stating she doesn't eat sweets.  We discussed that rice contains carbohydrates and encouraged decreasing rice intake and increasing fresh vegetables. We also encouraged increased exercise.  We will do more diet counseling in the future and provide patient with literature on diabetes in [place].  Jake Michaelis RN, Congregational Nurse [phone]

## 2019-11-05 MED FILL — LISINOPRIL-HCTZ 20-12.5 MG: 20-12.5 | 30 days supply | Qty: 60 | Fill #0

## 2019-11-26 ENCOUNTER — Ambulatory Visit (INDEPENDENT_AMBULATORY_CARE_PROVIDER_SITE_OTHER): Payer: Self-pay | Admitting: Sports Medicine

## 2019-11-26 ENCOUNTER — Encounter: Payer: Self-pay | Admitting: Sports Medicine

## 2019-11-26 ENCOUNTER — Other Ambulatory Visit: Payer: Self-pay

## 2019-11-26 VITALS — BP 142/94

## 2019-11-26 DIAGNOSIS — R202 Paresthesia of skin: Secondary | ICD-10-CM

## 2019-11-26 DIAGNOSIS — G5603 Carpal tunnel syndrome, bilateral upper limbs: Secondary | ICD-10-CM

## 2019-11-26 MED ORDER — MELOXICAM 15 MG PO TABS: ORAL_TABLET | ORAL | 2 refills | Status: DC

## 2019-11-26 MED ORDER — GABAPENTIN 300 MG PO CAPS: 300.0000 mg | ORAL_CAPSULE | Freq: Every day | ORAL | 2 refills | Status: DC

## 2019-11-26 NOTE — Progress Notes (Addendum)
   [facility] [address] [place], [place] [address] Phone: [phone] Fax: [phone]   Patient Name: Jillian Lutz Date of Birth: 07/10/1951 Medical Record Number: [id number] Gender: female Date of Encounter: 11/26/2019  SUBJECTIVE:      Chief Complaint:  Bilateral wrist pain and bilateral knee pain   HPI:  Interpreter was utilized today Patient is a 69 year old RHD F presenting with 2-3 years of bilateral wrist pain, left worse than right, has been told in the past it is carpal tunnel syndrome.  She is wearing night splints.  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  She does get associated numbness in her fingertips.  She feels wearing her splints at work are too cumbersome and make it difficult to lift boxes and other materials. She is also complaining of bilateral lower leg burning worse while working.  She denies any instability.  There was no injury or fall.  Her boss does let her sit down regularly at work when the symptoms are bothersome that helps.   ROS:     See HPI.   PERTINENT  PMH / PSH / FH / SH:  Past Medical, Surgical, Social, and Family History Reviewed & Updated in the EMR. Pertinent findings include:  History of bilateral paresthesias in both lower extremities, trigger finger   OBJECTIVE:  BP (!) 142/94  Physical Exam:  Vital signs are reviewed.   GEN: Alert and oriented, NAD Pulm: Breathing unlabored PSY: normal mood, congruent affect  MSK: Bilateral wrist No swelling or erythema Forearm sensation intact  ROM: Flexion, extension, radial and ulnar deviation full active and passive Full strength at wrist and fingers, left wrist slightly weaker than right No TTP Positive tinel tap  Positive phalens Negative froment's NVI  Bilateral knee: Normal to inspection with no erythema or effusion or obvious bony abnormalities. Palpation normal with no  warmth, joint line tenderness, patellar tenderness, or condyle tenderness. ROM full in flexion and extension and lower leg rotation. Ligaments with solid consistent endpoints including ACL, PCL, LCL, MCL. Negative Mcmurray's and Thessaly tests. Non painful patellar compression. Patellar glide without crepitus. Patellar and quadriceps tendons unremarkable. Hamstring and quadriceps strength is normal.  Neurovascularly intact.   ASSESSMENT & PLAN:   1. Bilateral carpal tunnel, left worse than right  We will start patient on a home exercise program for carpal tunnel.  She is to use gabapentin 300 mg nightly and meloxicam 15 mg before a shift at work.  We have provided her a work note to not lift more than 10 pounds.  She will follow-up with me in 1 month at which time we can consider ultrasound measurements and a possible injection.  2.  Bilateral lower extremity weakness and paresthesias  I explained to the patient that I do not think this is musculoskeletal in origin.  It sounds a little bit more related to vascular disease.  I have provided her with compression knee sleeves she can use to help the symptoms while at work.  I recommended she follow-up with her primary care doctor to consider further medical testing.  Lanier Clam, DO, ATC Sports Medicine Fellow  Addendum:  I was the preceptor for this visit and available for immediate consultation.  Karlton Lemon MD [name]

## 2019-12-04 MED FILL — LISINOPRIL-HCTZ 20-12.5 MG: 20-12.5 | 30 days supply | Qty: 60 | Fill #1

## 2019-12-31 ENCOUNTER — Other Ambulatory Visit: Payer: Self-pay

## 2019-12-31 ENCOUNTER — Ambulatory Visit (INDEPENDENT_AMBULATORY_CARE_PROVIDER_SITE_OTHER): Payer: Self-pay | Admitting: Sports Medicine

## 2019-12-31 ENCOUNTER — Encounter: Payer: Self-pay | Admitting: Sports Medicine

## 2019-12-31 VITALS — BP 152/77 | Ht <= 58 in | Wt 119.0 lb

## 2019-12-31 DIAGNOSIS — G5603 Carpal tunnel syndrome, bilateral upper limbs: Secondary | ICD-10-CM

## 2019-12-31 MED ORDER — MELOXICAM 15 MG PO TABS: ORAL_TABLET | ORAL | 2 refills | Status: DC

## 2019-12-31 MED ORDER — GABAPENTIN 300 MG PO CAPS: 300.0000 mg | ORAL_CAPSULE | Freq: Every day | ORAL | 2 refills | Status: DC

## 2019-12-31 MED FILL — MELOXICAM 15 MG TABLET: 15 | 40 days supply | Qty: 40 | Fill #1

## 2019-12-31 MED FILL — GABAPENTIN 300 MG CAPSULE: 300 | 30 days supply | Qty: 30 | Fill #1

## 2019-12-31 NOTE — Progress Notes (Addendum)
   [facility] [address] [place], [place] [address] Phone: [phone] Fax: [phone]   Patient Name: Jillian Lutz Date of Birth: [birth year] Medical Record Number: [id number] Gender: female Date of Encounter: 12/31/2019  SUBJECTIVE:      Chief Complaint:  Carpal tunnel follow-up   HPI:  Interpreter utilized for visit Overall, patient states she is 30% better since her last visit.  She is using the meloxicam and gabapentin daily.  She is also sleeping in nighttime splints.  She has not started any home exercises.  She denies any swelling or new injury.     ROS:     See HPI.   PERTINENT  PMH / PSH / FH / SH:  Past Medical, Surgical, Social, and Family History Reviewed & Updated in the EMR.    OBJECTIVE:  BP (!) 152/77   Ht 4\' 10"  (1.473 m)   Wt 119 lb (54 kg)   BMI 24.87 kg/m  Physical Exam:  Vital signs are reviewed.   GEN: Alert and oriented, NAD Pulm: Breathing unlabored PSY: normal mood, congruent affect  MSK: Bilateral wrist No swelling or erythema Forearm sensation intact  ROM: Flexion, extension, radial and ulnar deviation full active and passive Full strength at wrist and fingers, left wrist slightly weaker than right No TTP Positive tinel tap  Positive phalens Negative froment's NVI  ASSESSMENT & PLAN:   1. Bilateral carpal tunnel syndrome  Given that she is improved 30% with conservative measures, we will move forward with continuing current protocol.  I have refilled her gabapentin and meloxicam.  I have also given her home exercises to start doing daily.  She will follow-up in 1 month, if she is not at least 50% better, I recommend ultrasound and consider injection.   Lanier Clam, DO, ATC Sports Medicine Fellow  Addendum:  I was the preceptor for this visit and available for immediate consultation.  Karlton Lemon MD [name]

## 2020-01-06 ENCOUNTER — Ambulatory Visit (INDEPENDENT_AMBULATORY_CARE_PROVIDER_SITE_OTHER): Payer: Self-pay | Admitting: Internal Medicine

## 2020-01-06 ENCOUNTER — Encounter: Payer: Self-pay | Admitting: Internal Medicine

## 2020-01-06 ENCOUNTER — Other Ambulatory Visit: Payer: Self-pay | Admitting: Internal Medicine

## 2020-01-06 VITALS — BP 126/76 | HR 58 | Temp 98.2°F | Ht <= 58 in | Wt 122.2 lb

## 2020-01-06 DIAGNOSIS — R7303 Prediabetes: Secondary | ICD-10-CM

## 2020-01-06 DIAGNOSIS — I10 Essential (primary) hypertension: Secondary | ICD-10-CM

## 2020-01-06 DIAGNOSIS — R202 Paresthesia of skin: Secondary | ICD-10-CM

## 2020-01-06 MED ORDER — GABAPENTIN 300 MG PO CAPS: 600.0000 mg | ORAL_CAPSULE | Freq: Every day | ORAL | 2 refills | Status: DC

## 2020-01-06 MED ORDER — METFORMIN HCL ER 500 MG PO TB24: 500.0000 mg | ORAL_TABLET | Freq: Every day | ORAL | 5 refills | Status: DC

## 2020-01-06 MED ORDER — METFORMIN HCL ER 500 MG PO TB24
500.0000 mg | ORAL_TABLET | Freq: Every day | ORAL | 5 refills | Status: DC
Start: 2020-01-06 — End: 2020-10-14

## 2020-01-06 MED FILL — METFORMIN HCL ER 500 MG TB2: 500 | 30 days supply | Qty: 30 | Fill #0

## 2020-01-06 MED FILL — LISINOPRIL-HCTZ 20-12.5 MG: 20-12.5 | 30 days supply | Qty: 60 | Fill #2

## 2020-01-06 NOTE — Progress Notes (Signed)
Internal Medicine Clinic Attending  Case discussed with Dr. Krienke at the time of the visit.  We reviewed the resident's history and exam and pertinent patient test results.  I agree with the assessment, diagnosis, and plan of care documented in the resident's note.    

## 2020-01-06 NOTE — Assessment & Plan Note (Signed)
A1c on her last visit was elevated at 5.7, in the pre-diabetes range. She also has a history of elevated blood sugars in the past. She denies any symptoms at this time. She was given information regarding diet and exercise. Discussed starting treatment with metformin at this time, she is in agreement with this plan.  -metformin 500 mg daily

## 2020-01-06 NOTE — Progress Notes (Signed)
   CC: Bilateral leg pain and numbness  HPI:  [name] is a 69 y.o. with the history listed below presenting for bilateral leg numbness and a burning sensation for the past 2 months. Reports starting in her knees and progressing downwards to her toes. She reports it comes and goes randomly. Does not know if anything makes it worse, thinks it may be worse with standing for long periods of time and in the morning. Improved with leg elevation and soaking in hot water. She is on gabapentin for her carpal tunnel syndrome and she reports she is not sure if this helps, however does take this at night. Denies any falls, trauma, excess exertion, or changes in medication. Denies any other symptoms including fevers, chills, nausea, vomiting, headaches, light headedness, dizziness, weakness, or other issues. Reports her wrists have improved, and denies any hip, shoulder, elbow, or other joint issue.  Past Medical History:  Diagnosis Date  . Bilateral wrist pain 10/25/2017  . Neck mass 1998   Unknown biopsy results. Excised in [place].  . Trigger finger, acquired 02/08/2017   Review of Systems:   Constitutional: Negative for chills and fever.  Respiratory: Negative for shortness of breath.   Cardiovascular: Negative for chest pain and leg swelling.  Gastrointestinal: Negative for abdominal pain, nausea and vomiting.  MSK: Positive for bilateral leg pain and leg numbness.  Neurological: Negative for dizziness and headaches.   Physical Exam:  Vitals:   01/06/20 0912  BP: 126/76  Pulse: (!) 58  Temp: 98.2 F (36.8 C)  TempSrc: Oral  SpO2: 97%  Weight: 122 lb 3.2 oz (55.4 kg)  Height: 4\' 10"  (1.473 m)   Physical Exam Constitutional:      Appearance: Normal appearance.  Cardiovascular:     Rate and Rhythm: Normal rate and regular rhythm.     Pulses: Normal pulses.     Heart sounds: Normal heart sounds.  Pulmonary:     Effort: Pulmonary effort is normal.     Breath sounds: Normal breath  sounds.  Abdominal:     General: Abdomen is flat. Bowel sounds are normal.     Palpations: Abdomen is soft.  Musculoskeletal:        General: No tenderness. Normal range of motion.     Cervical back: Normal range of motion and neck supple.  Skin:    General: Skin is warm and dry.     Capillary Refill: Capillary refill takes less than 2 seconds.     Coloration: Skin is not jaundiced.     Findings: No erythema.  Neurological:     General: No focal deficit present.     Mental Status: She is alert and oriented to person, place, and time.     Sensory: Sensory deficit (BL LE decreased sensation to light touch) present.  Psychiatric:        Mood and Affect: Mood normal.        Behavior: Behavior normal.    Assessment & Plan:   See Encounters Tab for problem based charting.  Patient discussed with Dr. Philipp Ovens

## 2020-01-06 NOTE — Addendum Note (Signed)
Addended by: Asencion Noble on: 01/06/2020 11:23 AM   Modules accepted: Orders

## 2020-01-06 NOTE — Assessment & Plan Note (Signed)
Patient is currently taking lisinopril-HCTZ 40-25 (2 pills of 20-12.5 mg) daily. Denies any issues taking her medications. BP well controlled at 126/76 today. No changes at this time.

## 2020-01-06 NOTE — Assessment & Plan Note (Addendum)
Patient is presenting for bilateral leg numbness and a burning sensation for the past 2 months. Reports starting in her knees and progressing downwards to her toes. She reports it comes and goes randomly. Does not know if anything makes it worse, thinks it may be worse with standing for long periods of time and in the morning. Improved with leg elevation and soaking in hot water. She is on gabapentin for her carpal tunnel syndrome and she reports she is not sure if this helps, however does take this at night. Denies any falls, trauma, excess exertion, or changes in medication. Denies any other symptoms including fevers, chills, nausea, vomiting, headaches, light headedness, dizziness, weakness, or other issues. Reports her wrists have improved, and denies any hip, shoulder, elbow, or other joint issue. On exam she has reported decreased sensation in BL lower extremities. 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 She only has pre-diabetes and progression from knees to toes make diabetic neuropathy less likely. BMP on last visit was unremarkable. Will obtain some labs to further evaluate her neuropathy.   -B12, folate, ferritin, and TSH -Increase gabapentin to 600 mg QHS

## 2020-01-06 NOTE — Patient Instructions (Signed)
Jillian Lutz,  It was a pleasure to see you today. Thank you for coming in.   Today we discussed your lower extremity numbness and burning sensation. We are getting some labs to evaluate this. Please increase your gabapentin to 600 mg daily at bedtime.   We also discussed your pre-diabetes. Please start taking metformin 500 mg daily.   Please return to clinic in 3-4 weeks or sooner if needed.   Thank you again for coming in.   Asencion Noble.D.

## 2020-01-07 LAB — FOLATE RBC
Folate, Hemolysate: 335 ng/mL
Folate, RBC: 801 ng/mL (ref >498)
Hematocrit: 41.8 % (ref 34.0–46.6)

## 2020-01-07 LAB — TSH: TSH: 0.856 u[IU]/mL (ref 0.450–4.500)

## 2020-01-07 LAB — FERRITIN: Ferritin: 238 ng/mL — ABNORMAL HIGH (ref 15–150)

## 2020-01-07 LAB — VITAMIN B12: Vitamin B-12: 1336 pg/mL — ABNORMAL HIGH (ref 232–1245)

## 2020-01-28 ENCOUNTER — Ambulatory Visit (INDEPENDENT_AMBULATORY_CARE_PROVIDER_SITE_OTHER): Payer: Self-pay | Admitting: Family Medicine

## 2020-01-28 ENCOUNTER — Encounter: Payer: Self-pay | Admitting: Family Medicine

## 2020-01-28 ENCOUNTER — Other Ambulatory Visit: Payer: Self-pay

## 2020-01-28 VITALS — BP 124/82 | Wt 122.0 lb

## 2020-01-28 DIAGNOSIS — G5603 Carpal tunnel syndrome, bilateral upper limbs: Secondary | ICD-10-CM

## 2020-01-28 MED FILL — GABAPENTIN 300 MG CAPSULE: 300 | 30 days supply | Qty: 30 | Fill #2

## 2020-01-28 NOTE — Progress Notes (Signed)
Today's interpreter is [facility].  PCP: [name], MD  Subjective:   HPI: Patient is a 69 y.o. female here for bilateral wrist pain.  6/16: Overall, patient states she is 30% better since her last visit.  She is using the meloxicam and gabapentin daily.  She is also sleeping in nighttime splints.  She has not started any home exercises.  She denies any swelling or new injury.  7/14: Patient reports she's made great improvement since last visit. Wearing braces. She's been taking gabapentin at night and meloxicam during day though bottles are empty currently. Only issue she has is when lifting heavy items she gets volar wrist pain into hands with mild numbness. No other complaints.  Past Medical History:  Diagnosis Date  . Bilateral wrist pain 10/25/2017  . Neck mass 1998   Unknown biopsy results. Excised in [place].  . Trigger finger, acquired 02/08/2017    Current Outpatient Medications on File Prior to Visit  Medication Sig Dispense Refill  . gabapentin (NEURONTIN) 300 MG capsule Take 2 capsules (600 mg total) by mouth at bedtime. 60 capsule 2  . lisinopril-hydrochlorothiazide (ZESTORETIC) 20-12.5 MG tablet Take 2 tablets by mouth daily. 60 tablet 3  . meloxicam (MOBIC) 15 MG tablet Take one pill a day with food for 7 days and then prn thereafter 40 tablet 2  . metFORMIN (GLUCOPHAGE-XR) 500 MG 24 hr tablet Take 1 tablet (500 mg total) by mouth daily with breakfast. 30 tablet 5   No current facility-administered medications on file prior to visit.    Past Surgical History:  Procedure Laterality Date  . NECK SURGERY Left 1998   Excision of mass in [place]    No Known Allergies  Social History   Socioeconomic History  . Marital status: Married    Spouse name: Scientist, product/process development  . Number of children: Not on file  . Years of education: Not on file  . Highest education level: Not on file  Occupational History  . Not on file  Tobacco Use  . Smoking status: Never Smoker   . Smokeless tobacco: Never Used  Substance and Sexual Activity  . Alcohol use: No  . Drug use: No  . Sexual activity: Not on file  Other Topics Concern  . Not on file  Social History Narrative   Came to [place] from [place] in 2013.   Does not know much family history [place] medical care.   Social Determinants of Health   Financial Resource Strain:   . Difficulty of Paying Living Expenses:   Food Insecurity:   . Worried About Charity fundraiser in the Last Year:   . Arboriculturist in the Last Year:   Transportation Needs:   . Film/video editor (Medical):   Marland Kitchen Lack of Transportation (Non-Medical):   Physical Activity:   . Days of Exercise per Week:   . Minutes of Exercise per Session:   Stress:   . Feeling of Stress :   Social Connections:   . Frequency of Communication with Friends and Family:   . Frequency of Social Gatherings with Friends and Family:   . Attends Religious Services:   . Active Member of Clubs or Organizations:   . Attends Archivist Meetings:   Marland Kitchen Marital Status:   Intimate Partner Violence:   . Fear of Current or Ex-Partner:   . Emotionally Abused:   Marland Kitchen Physically Abused:   .  Sexually Abused:     Family History  Problem Relation Age of Onset  . Headache Son     BP 124/82   Wt 122 lb (55.3 kg)   BMI 25.50 kg/m   Review of Systems: See HPI above.     Objective:  Physical Exam:  Gen: NAD, comfortable in exam room  Bilateral wrists/hands: No deformity, swelling, bruising, atrophy. FROM with 5/5 strength. No tenderness to palpation. NVI distally. Negative tinels.   Assessment & Plan:  1. Bilateral carpal tunnel syndrome - much improved.  Still issues with heavy lifting primarily - light duty to restrict this.  Continue gabapentin at bedtime.  Meloxicam as needed.  Wrist braces at bedtime.  F/u in 6 weeks.

## 2020-01-28 NOTE — Congregational Nurse Program (Signed)
Home visit with interpreter [name] to deliver Gabapentin prescription to patient.  Jake Michaelis RN, Congregational Nurse [phone]

## 2020-01-28 NOTE — Patient Instructions (Addendum)
Use the braces just when you sleep for 6 more weeks. Take gabapentin at bedtime still. Take meloxicam only if you need it. You should have refills at the pharmacy for both of these medicines. Follow up with me in 6 weeks.

## 2020-02-03 ENCOUNTER — Encounter: Payer: Self-pay | Admitting: Internal Medicine

## 2020-02-04 MED FILL — LISINOPRIL-HCTZ 20-12.5 MG: 20-12.5 | 30 days supply | Qty: 60 | Fill #3

## 2020-02-04 MED FILL — MELOXICAM 15 MG TABLET: 15 | 40 days supply | Qty: 40 | Fill #2

## 2020-02-04 MED FILL — METFORMIN HCL ER 500 MG TB2: 500 | 30 days supply | Qty: 30 | Fill #1

## 2020-03-02 MED FILL — LISINOPRIL-HCTZ 20-12.5 MG: 20-12.5 | 30 days supply | Qty: 60 | Fill #3

## 2020-03-02 MED FILL — METFORMIN HCL ER 500 MG TB2: 500 | 30 days supply | Qty: 30 | Fill #1

## 2020-03-10 ENCOUNTER — Ambulatory Visit: Payer: Self-pay | Admitting: Family Medicine

## 2020-03-31 ENCOUNTER — Other Ambulatory Visit: Payer: Self-pay | Admitting: Internal Medicine

## 2020-03-31 DIAGNOSIS — I10 Essential (primary) hypertension: Secondary | ICD-10-CM

## 2020-03-31 MED ORDER — LISINOPRIL-HYDROCHLOROTHIAZIDE 20-12.5 MG PO TABS: 2.0000 | ORAL_TABLET | Freq: Every day | ORAL | 3 refills | Status: DC

## 2020-03-31 MED FILL — METFORMIN HCL ER 500 MG TB2: 500 | 30 days supply | Qty: 30 | Fill #2

## 2020-03-31 MED FILL — LISINOPRIL-HCTZ 20-12.5 MG: 20-12.5 | 30 days supply | Qty: 60 | Fill #0

## 2020-04-14 ENCOUNTER — Ambulatory Visit (INDEPENDENT_AMBULATORY_CARE_PROVIDER_SITE_OTHER): Payer: Self-pay | Admitting: Internal Medicine

## 2020-04-14 ENCOUNTER — Other Ambulatory Visit: Payer: Self-pay

## 2020-04-14 ENCOUNTER — Ambulatory Visit: Payer: Self-pay

## 2020-04-14 ENCOUNTER — Encounter: Payer: Self-pay | Admitting: Internal Medicine

## 2020-04-14 VITALS — BP 154/77 | HR 56 | Temp 98.2°F | Ht <= 58 in | Wt 121.2 lb

## 2020-04-14 DIAGNOSIS — H269 Unspecified cataract: Secondary | ICD-10-CM | POA: Insufficient documentation

## 2020-04-14 DIAGNOSIS — I10 Essential (primary) hypertension: Secondary | ICD-10-CM

## 2020-04-14 DIAGNOSIS — H538 Other visual disturbances: Secondary | ICD-10-CM

## 2020-04-14 MED ORDER — AMLODIPINE BESYLATE 2.5 MG PO TABS: 2.5000 mg | ORAL_TABLET | Freq: Every day | ORAL | 1 refills | Status: DC

## 2020-04-14 MED FILL — AMLODIPINE 2.5 MG TABLET: 2.5 | 30 days supply | Qty: 30 | Fill #0

## 2020-04-14 NOTE — Assessment & Plan Note (Signed)
Blurry vision: She states that for the past month she has been experiencing blurry vision with no clear exacerbating factor.  She states that her vision can get blurry either in the day or during the night.  She denies eye pain, nausea, vomiting, fevers, chills, photopsia, temporal lobe pain.  She states that she is never worn glasses nor does she have a history of cataract.  She does not drive and thus unable to give clear history as to how her vision changes at night or if her vision glasses at night.  On physical exams, there was noticeable arcus senilis bilaterally, I was unable to appreciate cataract glare, papilledema or arterial kinks.  She did not have pain with extraocular movement.   Assessment and plan: Age-related vision changes, macular degeneration, cataracts, hypertensive retinopathy, glaucoma.  A referral has been placed to ophthalmology.  Diabetic retinopathy very less likely as patient is not diabetic.  It is reassuring that she presently does not have any life-threatening ophthalmologic emergency.

## 2020-04-14 NOTE — Progress Notes (Signed)
   CC: Follow-up hypertension  HPI:  Jillian Lutz is a 69 y.o. with medical history significant for hypertension, prediabetes presenting to follow-up on chronic medical problems.  Please see problem based charting for further details.  Past Medical History:  Diagnosis Date  . Bilateral wrist pain 10/25/2017  . Neck mass 1998   Unknown biopsy results. Excised in [place].  . Trigger finger, acquired 02/08/2017   Review of Systems:  As per HPI  Physical Exam:  Vitals:   04/14/20 1343 04/14/20 1415  BP: (!) 159/75 (!) 154/77  Pulse: (!) 56 (!) 56  Temp: 98.2 F (36.8 C)   TempSrc: Oral   SpO2: 100%   Weight: 121 lb 3.2 oz (55 kg)   Height: 4\' 10"  (1.473 m)    Physical Exam Vitals and nursing note reviewed.  Constitutional:      Appearance: Normal appearance.  HENT:     Head:     Comments: there was noticeable arcus senilis bilaterally, I was unable to appreciate cataract glare, papilledema or arterial kinks.  She did not have pain with extraocular movement.  Cardiovascular:     Rate and Rhythm: Bradycardia present.     Heart sounds: No murmur heard.   Pulmonary:     Breath sounds: No rales.  Neurological:     Mental Status: She is alert.     Assessment & Plan:   See Encounters Tab for problem based charting.  Patient discussed with Dr. Jimmye Norman

## 2020-04-14 NOTE — Assessment & Plan Note (Signed)
Hypertension: Uncontrolled.  She states that she does not have increased salt intake in her diet.  She is compliant with her medications.  Unfortunately, she is not been keeping a log of her blood pressures at home as she recently moved  BP Readings from Last 3 Encounters:  04/14/20 (!) 154/77  01/28/20 124/82  01/06/20 126/76   Plan: -Continue lisinopril-hydrochlorothiazide 20-12.5 mg 2 pills daily -Add amlodipine 2.5 mg daily -Return to clinic in 4 weeks for hypertension follow-up

## 2020-04-14 NOTE — Patient Instructions (Addendum)
Jillian Lutz,   Thanks for seeing me today. In regards to your blurry vision, I have referred you to an eye doctor. For your high blood pressure, I have added a medication called amlodipine which you take 1 pill once a day. For your other blood pressure medicine take 1 pill in the morning and the other pill in the evening.

## 2020-04-15 NOTE — Progress Notes (Signed)
DOS 04/14/20:  Internal Medicine Clinic Attending  Case discussed with Dr. Eileen Stanford  At the time of the visit.  We reviewed the resident's history and exam and pertinent patient test results.  I agree with the assessment, diagnosis, and plan of care documented in the resident's note.

## 2020-05-12 ENCOUNTER — Other Ambulatory Visit: Payer: Self-pay

## 2020-05-12 ENCOUNTER — Other Ambulatory Visit: Payer: Self-pay | Admitting: Internal Medicine

## 2020-05-12 ENCOUNTER — Encounter: Payer: Self-pay | Admitting: Internal Medicine

## 2020-05-12 ENCOUNTER — Ambulatory Visit (INDEPENDENT_AMBULATORY_CARE_PROVIDER_SITE_OTHER): Payer: Self-pay | Admitting: Internal Medicine

## 2020-05-12 VITALS — BP 132/80 | HR 63 | Temp 97.8°F | Ht <= 58 in | Wt 121.2 lb

## 2020-05-12 DIAGNOSIS — H538 Other visual disturbances: Secondary | ICD-10-CM

## 2020-05-12 DIAGNOSIS — I10 Essential (primary) hypertension: Secondary | ICD-10-CM

## 2020-05-12 MED ORDER — AMLODIPINE BESYLATE 5 MG PO TABS: 5.0000 mg | ORAL_TABLET | Freq: Every day | ORAL | 3 refills | Status: DC

## 2020-05-12 MED FILL — AMLODIPINE BESYLATE 5 MG TA: 5 | 30 days supply | Qty: 30 | Fill #0

## 2020-05-12 NOTE — Patient Instructions (Addendum)
Thank you for allowing [place] to provide your care today. Today we discussed your blood pressure   I have ordered bmp labs for you. I will call if any are abnormal.    Today we made the following changes to your medications.    Increase your amlodipine to 5mg  daily.  Please follow-up in 6 months.    Should you have any questions or concerns please call the internal medicine clinic at [phone].

## 2020-05-14 ENCOUNTER — Encounter: Payer: Self-pay | Admitting: Internal Medicine

## 2020-05-14 MED FILL — METFORMIN HCL ER 500 MG TB2: 500 | 30 days supply | Qty: 30 | Fill #3

## 2020-05-14 MED FILL — LISINOPRIL-HCTZ 20-12.5 MG: 20-12.5 | 30 days supply | Qty: 60 | Fill #1

## 2020-05-14 NOTE — Progress Notes (Signed)
   CC: hypertension  HPI: [name] is a 69 y.o. with PMH listed below presenting with complaint of hypertension. Please see problem based assessment and plan for further details.  Past Medical History:  Diagnosis Date  . Bilateral wrist pain 10/25/2017  . Neck mass 1998   Unknown biopsy results. Excised in [place].  . Trigger finger, acquired 02/08/2017   Review of Systems: Review of Systems  Constitutional: Negative for chills, fever and malaise/fatigue.  Eyes: Positive for blurred vision. Negative for double vision, photophobia and discharge.  Respiratory: Negative for shortness of breath.   Cardiovascular: Negative for chest pain and palpitations.  Gastrointestinal: Negative for constipation, diarrhea, nausea and vomiting.  Neurological: Negative for dizziness, sensory change and headaches.  All other systems reviewed and are negative.    Physical Exam: Vitals:   05/12/20 0949 05/12/20 1043  BP: 138/81 132/80  Pulse: 63   Temp: 97.8 F (36.6 C)   TempSrc: Oral   SpO2: 98%   Weight: 121 lb 3.2 oz (55 kg)   Height: 4\' 10"  (1.473 m)    Gen: Well-developed, well nourished, NAD HEENT: NCAT head, hearing intact, EOMI, PERRL, normal conjuctivae, no fixed pupil CV: RRR, S1, S2 normal Pulm: CTAB, No rales, no wheezes Extm: ROM intact, Peripheral pulses intact, No peripheral edema Skin: Dry, Warm, normal turgor, no wounds, no rashes, no lesions  Assessment & Plan:   Essential hypertension BP Readings from Last 3 Encounters:  05/12/20 132/80  04/14/20 (!) 154/77  01/28/20 124/82   [name] is a 69 yo F w/ PMH of pre-diabetes, htn presenting to [facility] to f/u her bp. Was started on amlodipine 2.5mg  last visit. She has been tolerating it well without significant side effects. Denies any light-headedness, headaches, leg edema.  A/p Much improved compared to prior. Still sightly above goal. Advised on DASH diet. - Increase amlodipine to 5mg  daily - C/w lisinopril-hctz  40-25mg  daily  Blurry vision, bilateral Noted to have pending referral for ophthalmology. Complaining of continued blurry vision. Exam shows no evidence of acute ophthalmological emergencies such as acute angle closure glacuoma, retinal detachment, retinal artery occlusion.  - F/u with opthalmology   Patient discussed with Dr. Jimmye Norman  -Gilberto Better, [facility] Pager: [phone]

## 2020-05-14 NOTE — Congregational Nurse Program (Signed)
Patient called to state she does not have transportation to pick up medication.  CN picked them up from [place] and delivered them to her at home.  Reviewed instructions and she expressed understanding.  Jake Michaelis RN, Congregational Nurse [phone]

## 2020-05-14 NOTE — Assessment & Plan Note (Signed)
BP Readings from Last 3 Encounters:  05/12/20 132/80  04/14/20 (!) 154/77  01/28/20 124/82   Jillian Lutz is a 69 yo F w/ PMH of pre-diabetes, htn presenting to [facility] to f/u her bp. Was started on amlodipine 2.5mg  last visit. She has been tolerating it well without significant side effects. Denies any light-headedness, headaches, leg edema.  A/p Much improved compared to prior. Still sightly above goal. Advised on DASH diet. - Increase amlodipine to 5mg  daily - C/w lisinopril-hctz 40-25mg  daily

## 2020-05-14 NOTE — Assessment & Plan Note (Signed)
Noted to have pending referral for ophthalmology. Complaining of continued blurry vision. Exam shows no evidence of acute ophthalmological emergencies such as acute angle closure glacuoma, retinal detachment, retinal artery occlusion.  - F/u with opthalmology

## 2020-05-17 NOTE — Progress Notes (Signed)
Internal Medicine Clinic Attending  Case discussed with Dr. Lee  At the time of the visit.  We reviewed the resident's history and exam and pertinent patient test results.  I agree with the assessment, diagnosis, and plan of care documented in the resident's note.    

## 2020-05-24 NOTE — Addendum Note (Signed)
Addended by: Hulan Fray on: 05/24/2020 06:18 PM   Modules accepted: Orders

## 2020-06-21 MED FILL — LISINOPRIL-HCTZ 20-12.5 MG: 20-12.5 | 30 days supply | Qty: 60 | Fill #2

## 2020-06-21 MED FILL — METFORMIN HCL ER 500 MG TB2: 500 | 30 days supply | Qty: 30 | Fill #4

## 2020-06-22 NOTE — Congregational Nurse Program (Signed)
Home visit to deliver medications which were picked up from [facility].  Those prescriptions were Metformin and Lisinopril.  Jake Michaelis RN, Congregational Nurse [phone]

## 2020-08-11 MED FILL — LISINOPRIL-HCTZ 20-12.5 MG: 20-12.5 | 30 days supply | Qty: 60 | Fill #3

## 2020-08-11 MED FILL — METFORMIN HCL ER 500 MG TB2: 500 | 30 days supply | Qty: 30 | Fill #5

## 2020-08-11 MED FILL — AMLODIPINE BESYLATE 5 MG TA: 5 | 30 days supply | Qty: 30 | Fill #1

## 2020-08-12 NOTE — Congregational Nurse Program (Signed)
Home visit to deliver medication which was picked up from [facility].  Patient called interpreter [name] yesterday to state that she has been out of medication for 2 days with no way to pick it up. Jake Michaelis RN, Congregational Nurse [phone]

## 2020-08-18 ENCOUNTER — Telehealth: Payer: Self-pay

## 2020-08-18 NOTE — Telephone Encounter (Signed)
Phone call to patient (interpreter [name] assisting). Informed patient that she and her husband have follow-up appointments with their doctors at [facility] on 08/26/2020.  Reminded them to take their medications to appointment.  She also stated they plan to come to the Covid vaccine clinic at the [facility] on 08/21/2020 to get their first dose.  Jake Michaelis RN, Congregational Nurse [phone]

## 2020-08-26 ENCOUNTER — Other Ambulatory Visit: Payer: Self-pay

## 2020-08-26 ENCOUNTER — Ambulatory Visit (INDEPENDENT_AMBULATORY_CARE_PROVIDER_SITE_OTHER): Payer: Self-pay | Admitting: Internal Medicine

## 2020-08-26 VITALS — BP 126/83 | HR 66 | Temp 97.9°F | Wt 117.6 lb

## 2020-08-26 DIAGNOSIS — K649 Unspecified hemorrhoids: Secondary | ICD-10-CM | POA: Insufficient documentation

## 2020-08-26 DIAGNOSIS — K648 Other hemorrhoids: Secondary | ICD-10-CM

## 2020-08-26 DIAGNOSIS — K643 Fourth degree hemorrhoids: Secondary | ICD-10-CM

## 2020-08-26 DIAGNOSIS — K625 Hemorrhage of anus and rectum: Secondary | ICD-10-CM | POA: Insufficient documentation

## 2020-08-26 LAB — POC HEMOCCULT BLD/STL (OFFICE/1-CARD/DIAGNOSTIC): Fecal Occult Blood, POC: NEGATIVE

## 2020-08-26 NOTE — Patient Instructions (Addendum)
It was nice seeing you today! Thank you for choosing [facility] for your Primary Care.    Today we talked about:   1. Bright Red Blood In stool:  a. On examination, you do have hemorrhoids. This is vessel that pops outs and it can bleed when under pressure. This is why it is important to avoid constipating medications, such as anti-diarrheal medications or Imodium.  b. I would recommend a daily stool softener such as Miralax.  c. I still recommend you see a stomach doctor to discuss checking for colon cancer as well. I have placed a referral and will call you to schedule this. This appointment will be covered by your [facility].  Follow up in 4-6 weeks with your primary care doctor to address your chronic conditions and additional concerns from today.

## 2020-08-26 NOTE — Progress Notes (Signed)
   CC: Bright red blood per rectum  HPI:  Jillian Lutz is a 70 y.o. with a PMHx as listed below who presents to the clinic for Bright red blood per rectum.   Please see the Encounters tab for problem-based Assessment & Plan regarding status of patient's acute and chronic conditions.  Past Medical History:  Diagnosis Date  . Bilateral wrist pain 10/25/2017  . Neck mass 1998   Unknown biopsy results. Excised in [place].  . Trigger finger, acquired 02/08/2017   Review of Systems: Review of Systems  Constitutional: Positive for weight loss. Negative for chills, fever and malaise/fatigue.  Gastrointestinal: Positive for blood in stool. Negative for abdominal pain, constipation, diarrhea, heartburn, melena, nausea and vomiting.       Endorses lack of appetite  Genitourinary: Negative for dysuria.  Neurological: Negative for dizziness, focal weakness, weakness and headaches.   Physical Exam:  Vitals:   08/26/20 1006  BP: 126/83  Pulse: 66  Temp: 97.9 F (36.6 C)  TempSrc: Oral  SpO2: 100%  Weight: 117 lb 9.6 oz (53.3 kg)   Physical Exam Vitals and nursing note reviewed. Exam conducted with a chaperone present.  Constitutional:      General: She is not in acute distress.    Appearance: She is normal weight.  Pulmonary:     Effort: Pulmonary effort is normal. No respiratory distress.  Abdominal:     General: Bowel sounds are normal. There is no distension.     Palpations: Abdomen is soft.     Tenderness: There is no abdominal tenderness. There is no guarding.  Genitourinary:      Comments: On digital rectal exam, patient did have grade 1 internal hemorrhoids without any bleeding noted. Skin:    General: Skin is warm and dry.  Neurological:     Mental Status: She is alert and oriented to person, place, and time. Mental status is at baseline.  Psychiatric:        Mood and Affect: Mood normal.        Behavior: Behavior normal.     Assessment & Plan:   See Encounters  Tab for problem based charting.  Patient discussed with Dr. Evette Doffing

## 2020-08-27 LAB — CBC WITH DIFFERENTIAL/PLATELET
Basophils Absolute: 0.1 10*3/uL (ref 0.0–0.2)
Basos: 2 %
EOS (ABSOLUTE): 0.2 10*3/uL (ref 0.0–0.4)
Eos: 4 %
Hematocrit: 36.4 % (ref 34.0–46.6)
Hemoglobin: 11.4 g/dL (ref 11.1–15.9)
Immature Grans (Abs): 0 10*3/uL (ref 0.0–0.1)
Immature Granulocytes: 1 %
Lymphocytes Absolute: 1.4 10*3/uL (ref 0.7–3.1)
Lymphs: 23 %
MCH: 25.4 pg — ABNORMAL LOW (ref 26.6–33.0)
MCHC: 31.3 g/dL — ABNORMAL LOW (ref 31.5–35.7)
MCV: 81 fL (ref 79–97)
Monocytes Absolute: 0.4 10*3/uL (ref 0.1–0.9)
Monocytes: 6 %
Neutrophils Absolute: 4.1 10*3/uL (ref 1.4–7.0)
Neutrophils: 64 %
Platelets: 247 10*3/uL (ref 150–450)
RBC: 4.48 x10E6/uL (ref 3.77–5.28)
RDW: 14.2 % (ref 11.7–15.4)
WBC: 6.3 10*3/uL (ref 3.4–10.8)

## 2020-08-27 LAB — FERRITIN: Ferritin: 259 ng/mL — ABNORMAL HIGH (ref 15–150)

## 2020-08-27 NOTE — Assessment & Plan Note (Addendum)
On examination, patient had both internal and external hemorrhoids present.  Patient does endorse taking antidiarrheals thinking it would resolve her bright red blood per rectum.  I suspect this may have exacerbated her hemorrhoids.  On exam they are nonulcerated and nonthrombosed.  -Discontinue antidiarrheals -Begin MiraLAX to maintain daily soft stools

## 2020-08-27 NOTE — Assessment & Plan Note (Addendum)
Jillian Lutz endorses history of bright red blood per rectum that is on the toilet paper and occasionally mixed in her stool that started occurring in August 2021.  She denies any other changes to her bowel movements including size or shape.  She denies any nausea, vomiting, abdominal pain or any pain with defecation.  She denies any melena.  She does endorse some weight loss that was unintentional.  Assessment/plan: On physical exam nation, patient did have internal and external hemorrhoids several long ulcerated, nonthrombosed nonbleeding at the time.  Her FOBT card was negative as well.  CBC and ferritin were obtained and no evidence of iron deficiency anemia present.  However patient has never had a colonoscopy before, and it would be prudent to evaluate for any colon abnormality including carcinoma, especially since blood is occasionally mixed into her stool.  -Referral to GI for consideration of colonoscopy

## 2020-08-30 NOTE — Progress Notes (Signed)
Internal Medicine Clinic Attending  Case discussed with Dr. Basaraba  At the time of the visit.  We reviewed the resident's history and exam and pertinent patient test results.  I agree with the assessment, diagnosis, and plan of care documented in the resident's note.  

## 2020-09-16 ENCOUNTER — Encounter: Payer: Self-pay | Admitting: Internal Medicine

## 2020-09-24 ENCOUNTER — Encounter: Payer: Self-pay | Admitting: Student

## 2020-09-24 ENCOUNTER — Other Ambulatory Visit: Payer: Self-pay

## 2020-09-24 ENCOUNTER — Ambulatory Visit (INDEPENDENT_AMBULATORY_CARE_PROVIDER_SITE_OTHER): Payer: Self-pay | Admitting: Student

## 2020-09-24 VITALS — BP 129/66 | HR 58 | Temp 97.9°F | Ht 58.5 in | Wt 117.6 lb

## 2020-09-24 DIAGNOSIS — H6120 Impacted cerumen, unspecified ear: Secondary | ICD-10-CM | POA: Insufficient documentation

## 2020-09-24 DIAGNOSIS — H6121 Impacted cerumen, right ear: Secondary | ICD-10-CM

## 2020-09-24 DIAGNOSIS — H538 Other visual disturbances: Secondary | ICD-10-CM

## 2020-09-24 MED ORDER — DEBROX 6.5 % OT SOLN: 5.0000 [drp] | Freq: Two times a day (BID) | OTIC | 0 refills | Status: DC

## 2020-09-24 NOTE — Assessment & Plan Note (Signed)
Patient reports mild blurry vision and worsening near-sightedness over the last two months. States she has not previously seen an eye doctor before. Denies eye pain with movement, rhinorrhea, headaches, sore throat.  A/P: Appears to be chronic issue per char review. BP at goal. Explained to patient that diabetes puts her at an increased risk for eye complications and she needs to be seen. Per chart review, patient has had had numerous referrals in the past for ophthalmology. - Re-referral for ophthalmology - Financial aid counseling, as orange card set to expire soon.

## 2020-09-24 NOTE — Patient Instructions (Signed)
Ms. Manges,  It was a pleasure seeing you today!  I have prescribed you ear drops for your right ear. Put 5 drops in the right ear, twice daily. We will see you back in about a week to re-assess.  I have also placed a referral for the eye doctor.  In addition, you will need to set up an appointment with our financial counselor.  We look forward to seeing you next time. Please call our clinic at [phone] if you have any questions or concerns. The best time to call is Monday-Friday from 9am-4pm, but there is someone available 24/7 at the same number. If you need medication refills, please notify your pharmacy one week in advance and they will send [place] a request.  Thank you for letting us take part in your care. Wishing you the best!  Thank you, Dr. Sanjuan Dame, MD

## 2020-09-24 NOTE — Assessment & Plan Note (Signed)
Patient reports 80mo of progressive right-sided ear pain on palpation and mild hearing loss on the right. Denies previous episodes, tinnitus, rhinorrhea, sore throat, fevers, chills, dyspnea, chest pain. Says she feels like she needs to clean out her ears, and when she tries to do so with her fingers, she experiences pain.   A/P: On exam, cerumen impaction on right, clear on left. Attempted ear irrigation, but unsuccessful and patient experiencing pain. Will prescribed Debrox and have patient return in one week for irrigation. - Debrox BID x7d - F/u in one week for R ear irrigation

## 2020-09-24 NOTE — Progress Notes (Signed)
   CC: R ear pain, worsening vision  HPI:  Jillian Lutz is a 70 y.o. with hypertension, pre-diabetes presenting to [facility] today for R ear pain and worsening vision.  Please see problem-based list for further details, assessments, and plans.  Past Medical History:  Diagnosis Date  . Bilateral wrist pain 10/25/2017  . Neck mass 1998   Unknown biopsy results. Excised in [place].  . Trigger finger, acquired 02/08/2017   Review of Systems:  As per HPI  Physical Exam:  Vitals:   09/24/20 1010  BP: 129/66  Pulse: (!) 58  Temp: 97.9 F (36.6 C)  TempSrc: Oral  SpO2: 95%  Weight: 117 lb 9.6 oz (53.3 kg)  Height: 4' 10.5" (1.486 m)   General: Sitting comfortably in chair, no acute distress HENT: Cerumen impaction in R ear, no erythema or foreign body. L ear without erythema, pain, clear TM. Eye movements in tact, normal lids. No scleral icterus or conjunctival injection bilaterally.  CV: Regular rate, rhythm. No m/r/g Pulm: Clear to auscultation bilaterally. Normal WOB   Assessment & Plan:   See Encounters Tab for problem based charting.  Patient discussed with Dr. Dareen Piano

## 2020-09-27 NOTE — Progress Notes (Signed)
Internal Medicine Clinic Attending ? ?Case discussed with Dr. Braswell  At the time of the visit.  We reviewed the resident?s history and exam and pertinent patient test results.  I agree with the assessment, diagnosis, and plan of care documented in the resident?s note.  ?

## 2020-09-29 NOTE — Congregational Nurse Program (Signed)
Home visit with interpreter [name].  Reminded patient of tomorrow's 9:30 appointment with [facility] and that [facility] transportation is scheduled to pick her up at 8:45 am.  She is very concerned about the rectal bleeding she has been experiencing and believes that the blood loss is making her tired.  Reassured patient that her problem will be addressed tomorrow. Jake Michaelis RN, Congregational Nurse [phone]

## 2020-09-30 ENCOUNTER — Other Ambulatory Visit: Payer: Self-pay | Admitting: Nurse Practitioner

## 2020-09-30 ENCOUNTER — Other Ambulatory Visit (INDEPENDENT_AMBULATORY_CARE_PROVIDER_SITE_OTHER): Payer: Self-pay

## 2020-09-30 ENCOUNTER — Encounter: Payer: Self-pay | Admitting: Nurse Practitioner

## 2020-09-30 ENCOUNTER — Ambulatory Visit (INDEPENDENT_AMBULATORY_CARE_PROVIDER_SITE_OTHER): Payer: Self-pay | Admitting: Nurse Practitioner

## 2020-09-30 ENCOUNTER — Other Ambulatory Visit: Payer: Self-pay

## 2020-09-30 VITALS — BP 130/68 | HR 76 | Ht <= 58 in | Wt 117.8 lb

## 2020-09-30 DIAGNOSIS — R1030 Lower abdominal pain, unspecified: Secondary | ICD-10-CM

## 2020-09-30 DIAGNOSIS — K625 Hemorrhage of anus and rectum: Secondary | ICD-10-CM

## 2020-09-30 LAB — URINALYSIS, ROUTINE W REFLEX MICROSCOPIC

## 2020-09-30 MED ORDER — NA SULFATE-K SULFATE-MG SULF 17.5-3.13-1.6 GM/177ML PO SOLN: 1.0000 | Freq: Once | ORAL | 0 refills | Status: DC

## 2020-09-30 NOTE — Patient Instructions (Signed)
If you are age 70 or older, your body mass index should be between 23-30. Your Body mass index is 24.62 kg/m. If this is out of the aforementioned range listed, please consider follow up with your Primary Care Provider.  If you are age 48 or younger, your body mass index should be between 19-25. Your Body mass index is 24.62 kg/m. If this is out of the aformentioned range listed, please consider follow up with your Primary Care Provider.   Your provider has requested that you go to the basement level for lab work before leaving today. Press "B" on the elevator. The lab is located at the first door on the left as you exit the elevator.  You have been scheduled for a colonoscopy. Please follow written instructions given to you at your visit today.  Please pick up your prep supplies at the pharmacy within the next 1-3 days. If you use inhalers (even only as needed), please bring them with you on the day of your procedure.  Due to recent changes in healthcare laws, you may see the results of your imaging and laboratory studies on MyChart before your provider has had a chance to review them.  We understand that in some cases there may be results that are confusing or concerning to you. Not all laboratory results come back in the same time frame and the provider may be waiting for multiple results in order to interpret others.  Please give [place] 48 hours in order for your provider to thoroughly review all the results before contacting the office for clarification of your results.

## 2020-09-30 NOTE — Progress Notes (Signed)
ASSESSMENT AND PLAN    # 70 year old [place], non-English speaking female referred for rectal bleeding,  present since August 2021.  Hemoglobin normal at 11.4.  No evidence for iron deficiency with ferritin of 259.  Bleeding probably hemorrhoidal.  Need to exclude other etiologies such as colon neoplasm. --Patient will be scheduled for a colonoscopy. The risks and benefits of colonoscopy with possible polypectomy / biopsies were discussed and the patient agrees to proceed.  --Patient will be provided information about a company who provides care partners for procedures.  She will also see if any of her family members or congregational nurse can serve as care partner on the day of her colonoscopy.    # Lower abdominal discomfort with urination.  --Obtain UA  # ? History of DM ( not in [place]) , patient unsure but she takes Metformin  # ? HTN. She is taking an anti- abdominal  HISTORY OF PRESENT ILLNESS     Chief Complaint : rectal bleeding  Jillian Lutz is a 70 y.o. female, new to the practice, referred by PCP for rectal bleeding.  Patient is [place], non-English speaker, and here with an interpreter.  Patient has a history of intermittent rectal bleeding since August.  She does have some mild associated rectal pain.  No abdominal pain except for lower abdominal discomfort when she urinates.  She has no dysuria . She denies constipation/straining.  Sometimes she feels the need to have a bowel movement but just passes red blood from her rectum.  She has no other GI complaints.  Weight is stable.  No known history of colon cancer in the family  Hemoglobin to 08/26/20 was 11.4 with MCV of 81.  Ferritin 259, normal B12 and folate  Patient is on amlodipine as well as Glucophage but does not know if she has diabetes or hypertension.  : Past Medical History:  Diagnosis Date  . Bilateral wrist pain 10/25/2017  . Hypertension   . Neck mass 1998   Unknown biopsy results. Excised in  [place].  . Trigger finger, acquired 02/08/2017  ? Diabetes ( on Metformin)   Past Surgical History:  Procedure Laterality Date  . NECK SURGERY Left 1998   Excision of mass in [place]   Family History  Problem Relation Age of Onset  . Headache Son    Social History   Tobacco Use  . Smoking status: Never Smoker  . Smokeless tobacco: Never Used  Substance Use Topics  . Alcohol use: No  . Drug use: No   Current Outpatient Medications  Medication Sig Dispense Refill  . amLODipine (NORVASC) 5 MG tablet Take 1 tablet (5 mg total) by mouth daily. 90 tablet 3  . carbamide peroxide (DEBROX) 6.5 % OTIC solution Place 5 drops into the right ear 2 (two) times daily for 7 days. 15 mL 0  . gabapentin (NEURONTIN) 300 MG capsule Take 2 capsules (600 mg total) by mouth at bedtime. 60 capsule 2  . lisinopril-hydrochlorothiazide (ZESTORETIC) 20-12.5 MG tablet Take 2 tablets by mouth daily. 60 tablet 3  . meloxicam (MOBIC) 15 MG tablet Take one pill a day with food for 7 days and then prn thereafter 40 tablet 2  . metFORMIN (GLUCOPHAGE-XR) 500 MG 24 hr tablet Take 1 tablet (500 mg total) by mouth daily with breakfast. 30 tablet 5   No current facility-administered medications for this visit.   No Known Allergies   Review of Systems:  All systems reviewed and negative except where noted in  HPI.   PHYSICAL EXAM :    Wt Readings from Last 3 Encounters:  09/24/20 117 lb 9.6 oz (53.3 kg)  08/26/20 117 lb 9.6 oz (53.3 kg)  05/12/20 121 lb 3.2 oz (55 kg)    BP 130/68   Pulse 76   Ht 4\' 10"  (1.473 m)   Wt 117 lb 12.8 oz (53.4 kg)   BMI 24.62 kg/m  Constitutional:  Pleasant female in no acute distress. Psychiatric: Normal mood and affect. Behavior is normal. EENT: Pupils normal.  Conjunctivae are normal. No scleral icterus. Neck supple.  Cardiovascular: Normal rate, regular rhythm. No edema Pulmonary/chest: Effort normal and breath sounds normal. No wheezing, rales or  rhonchi. Abdominal: Soft, nondistended, nontender. Bowel sounds active throughout. There are no masses palpable. No hepatomegaly. Rectal:  External hemorrhoid tags.  No obvious fissures . No stool or blood in vault Neurological: Alert and oriented to person place and time. Skin: Skin is warm and dry. No rashes noted.  Tye Savoy, NP  09/30/2020, 8:54 AM  Cc:  Referring Provider Lalla Brothers, MD

## 2020-10-01 ENCOUNTER — Other Ambulatory Visit: Payer: Self-pay

## 2020-10-01 DIAGNOSIS — R1030 Lower abdominal pain, unspecified: Secondary | ICD-10-CM

## 2020-10-03 LAB — URINE CULTURE
MICRO NUMBER:: 11664953
SPECIMEN QUALITY:: ADEQUATE

## 2020-10-04 ENCOUNTER — Telehealth: Payer: Self-pay

## 2020-10-04 ENCOUNTER — Encounter: Payer: Self-pay | Admitting: Student

## 2020-10-04 NOTE — Telephone Encounter (Signed)
After multiple attempts to reach patient via phone to remind her of 9:15 appointment at [facility] she called back and stated her phone was not working earlier.  She had forgotten appointment but states her ear feels good and is no longer hurting so she does not want to reschedule  appointment at present.  Asked her to let me know if it begins to hurt and we can reschedule.  Jake Michaelis RN, Congregational Nurse [phone]

## 2020-10-07 ENCOUNTER — Telehealth: Payer: Self-pay

## 2020-10-07 ENCOUNTER — Encounter: Payer: Self-pay | Admitting: Student

## 2020-10-07 ENCOUNTER — Other Ambulatory Visit: Payer: Self-pay

## 2020-10-07 MED ORDER — AMOXICILLIN-POT CLAVULANATE 500-125 MG PO TABS: 1.0000 | ORAL_TABLET | Freq: Two times a day (BID) | ORAL | 0 refills | Status: DC

## 2020-10-07 MED FILL — AMOX-CLAV 500-125 MG TABLET: 500-125 | 5 days supply | Qty: 10 | Fill #0

## 2020-10-07 NOTE — Telephone Encounter (Signed)
Left message with patient's contact person Jake Michaelis RN) to please call back.

## 2020-10-07 NOTE — Telephone Encounter (Signed)
-----   Message from Willia Craze, NP sent at 10/06/2020  1:52 PM EDT ----- Please let patient know that she has a UTI ( I don't think she speaks [place]). Please send a Rx for Augmentin 500mg  Q 12 hours x 5 days. Thanks

## 2020-10-07 NOTE — Telephone Encounter (Signed)
Jillian Lutz returned the call and was advised of UTI and medication sent to pharmacy she said "she will take care of it".

## 2020-10-07 NOTE — Congregational Nurse Program (Signed)
Home visit to deliver Augmentin that was picked up from [facility] out[facility]Patient pharmacy.  Told patient that [facility] nurse called to say that tests from her 09/30/2020 visit showed that she has a UTI. Patient stated she is having pain with urination.  Advised her to take medication every 12 hours with food and that she needs to take all of it, total of 5 days.  She voiced understanding.  Jake Michaelis RN, Congregational Nurse [phone]

## 2020-10-13 ENCOUNTER — Other Ambulatory Visit: Payer: Self-pay | Admitting: Internal Medicine

## 2020-10-13 DIAGNOSIS — I10 Essential (primary) hypertension: Secondary | ICD-10-CM

## 2020-10-13 MED FILL — AMLODIPINE BESYLATE 5 MG TA: 5 | 30 days supply | Qty: 30 | Fill #2

## 2020-10-14 ENCOUNTER — Other Ambulatory Visit: Payer: Self-pay | Admitting: Student

## 2020-10-14 DIAGNOSIS — R7303 Prediabetes: Secondary | ICD-10-CM

## 2020-10-14 MED ORDER — METFORMIN HCL ER 500 MG PO TB24: 500.0000 mg | ORAL_TABLET | Freq: Every day | ORAL | 5 refills | Status: DC

## 2020-10-14 MED FILL — LISINOPRIL-HCTZ 20-12.5 MG: 20-12.5 | 30 days supply | Qty: 60 | Fill #0

## 2020-10-14 MED FILL — METFORMIN HCL ER 500 MG TB2: 500 | 30 days supply | Qty: 30 | Fill #0

## 2020-10-18 ENCOUNTER — Telehealth: Payer: Self-pay

## 2020-10-18 NOTE — Telephone Encounter (Signed)
Attempted to reach congregational nurse.  Staff message sent to [facility] asking about moving up the procedure.

## 2020-10-18 NOTE — Telephone Encounter (Signed)
-----   Message from Mauri Pole, MD sent at 10/15/2020 12:25 PM EDT ----- Yes, I am fine with getting her in sooner with any provider that has an opening and she can establish with them given I never saw the patient.  Thanks ----- Message ----- From: Willia Craze, NP Sent: 10/14/2020   6:28 PM EDT To: Greggory Keen, LPN, Mauri Pole, MD  Beth or covering RN Congregational nurse contacted me through a staff message today.  Patient is still bleeding.  I am going to treat her with some topical steroids if she has not already tried that.  The congregational nurse will let me know if we need to send in a prescription for Anusol.  Can someone see if there are any sooner spots for colonoscopy for this patient.  She is not scheduled until mid May.  I believe she was a new patient assigned to [place] so if anyone else has an opening that I can talk to them. Thanks [employer]

## 2020-10-18 NOTE — Congregational Nurse Program (Signed)
Home visit to deliver prescriptions which were picked up from [facility].  Reminded patient of appointment tomorrow at Internal Medicine to meet with financial counselor.  Told her transportation has been arranged through [facility] and she should be ready for 12:20 pickup.  She stated she is feeling much better since completing antibiotic for UTI.  Also stated that her hemorrhoids are no longer bleeding. Left a tube of Preparation H with her in case bleeding returns.  Stated she wanted to cancel 12/01/2020 appointment for colonoscopy.  Explained reasons that even though she feels better she needs to have procedure and she agreed.  Let her know that CN could be present with her day of procedure. Jake Michaelis RN, Congregational Nurse [phone]

## 2020-10-19 ENCOUNTER — Ambulatory Visit: Payer: Self-pay

## 2020-10-20 NOTE — Congregational Nurse Program (Signed)
Home visit to pick up documents required to complete [facility] financial assistance and orange card applications.  Patient thought she had received bank statements in mail when in fact were advertisements from her bank.  Advised patient she would need to go to bank to pick up statements for January, February and March 2022.  She had just received [employer] award letter and had 2021 tax return.  When she gets bank statements CN will forward all this information to Internal Medicine financial counselor.  Jake Michaelis RN, Congregational Nurse [phone]

## 2020-10-26 NOTE — Telephone Encounter (Signed)
See the CN note from a visit with the patient today. Patient has told her the bleeding has stopped and wanted to cancel her procedure appointment for the colonoscopy. CN was successful in helping the patient understand  The importance of the procedure. It is still scheduled.

## 2020-11-06 NOTE — Progress Notes (Signed)
Reviewed and agree with documentation and assessment and plan. K. Veena Darrick Greenlaw , MD   

## 2020-11-10 ENCOUNTER — Other Ambulatory Visit (HOSPITAL_COMMUNITY): Payer: Self-pay

## 2020-11-10 MED FILL — Metformin HCl Tab ER 24HR 500 MG: ORAL | 30 days supply | Qty: 30 | Fill #0 | Status: AC

## 2020-11-10 MED FILL — Sod Sulfate-Pot Sulf-Mg Sulf Oral Sol 17.5-3.13-1.6 GM/177ML: ORAL | 1 days supply | Qty: 354 | Fill #0 | Status: CN

## 2020-11-10 MED FILL — Lisinopril & Hydrochlorothiazide Tab 20-12.5 MG: ORAL | 30 days supply | Qty: 60 | Fill #0 | Status: AC

## 2020-11-10 MED FILL — Amlodipine Besylate Tab 5 MG (Base Equivalent): ORAL | 30 days supply | Qty: 30 | Fill #0 | Status: AC

## 2020-11-14 DIAGNOSIS — C20 Malignant neoplasm of rectum: Secondary | ICD-10-CM

## 2020-11-14 DIAGNOSIS — C7800 Secondary malignant neoplasm of unspecified lung: Secondary | ICD-10-CM

## 2020-11-14 HISTORY — DX: Secondary malignant neoplasm of unspecified lung: C78.00

## 2020-11-14 HISTORY — DX: Malignant neoplasm of rectum: C20

## 2020-11-15 NOTE — Congregational Nurse Program (Signed)
Home visit to deliver medications picked up from [facility] pharmacy (Amlodipine, Lisinopril and Metformin).  States she is checking blood pressure daily and it is good. Explained that her medication for colonoscopy prep is not covered by [facility] and that pharmacist is trying to find a way to reduce cost. Assured patient she would have it in time for procedure.  She voiced fear of having colonoscopy and asked that CN be with her and told her I would be there.  Will explore her concerns further when interpreter is present.  Jake Michaelis RN, Congregational Nurse [phone]

## 2020-11-22 ENCOUNTER — Other Ambulatory Visit (HOSPITAL_COMMUNITY): Payer: Self-pay

## 2020-11-24 ENCOUNTER — Telehealth: Payer: Self-pay

## 2020-11-24 NOTE — Congregational Nurse Program (Signed)
CN office visit with interpreter [name] assisting.  Patient came to discuss instructions for colonoscopy scheduled for next week (12/01/2020).  Reviewed general insrtuctions outlining reasons for cancellation of procedure but explained to her that specific instructions may change because she will not be using the prep materials she has (which are in [place]).  Phone call to [facility] to ask about alternative prep materials.  They will call back with recommendations.  CN will home visit patient to deliver them and go over instructions with her by using telephone interpreter.  Patient apprehensive regarding procedure.  Tried to reassure her that she would not feel any discomfort during or pain after the colonoscopy.  CN will be her support person during procedure.  Jake Michaelis RN, Congregational Nurse [phone]

## 2020-11-24 NOTE — Telephone Encounter (Signed)
Received a call from [place] in regards to patient's prep. She states that patient came in to her office today to review instructions but it was not in [place]. Advised that we are working on getting her a sample of an alternative prep. [name] wanted to know if she could do a Miralax split prep. Advised that once they switch the prep there will be new instructions.   Nira Conn, please call Judie Petit' Brien once new instructions for patient's prep are complete. Her phone number is [phone].

## 2020-11-24 NOTE — Telephone Encounter (Signed)
Left message for [name] to call back 

## 2020-11-24 NOTE — Telephone Encounter (Signed)
Inbound call from [place] returning your call.  Asked to please call her again.

## 2020-11-24 NOTE — Telephone Encounter (Signed)
Spoke with [name], switched patient to Miralax split dose prep. New instructions type up in both [place] and [place]etnamese. [name] will schedule home visit to go over instructions.

## 2020-11-26 NOTE — Congregational Nurse Program (Signed)
Home visit with interpreter [name] assisting via phone.  Took patient prep materials (gatorade, dulcolax and generic miralax) and instructions written in [place] for colonoscopy scheduled 12/01/2020 at [facility].  We read over instructions, showed patient how to mix and take prep and answered all questions.  Transportation was arranged thru [facility].  CN will stay with patient and her niece will be at her house to stay with her when she gets home.  Jake Michaelis RN, Congregational Nurse [phone]

## 2020-12-01 ENCOUNTER — Ambulatory Visit (AMBULATORY_SURGERY_CENTER): Payer: Self-pay | Admitting: Gastroenterology

## 2020-12-01 ENCOUNTER — Other Ambulatory Visit: Payer: Self-pay

## 2020-12-01 ENCOUNTER — Encounter: Payer: Self-pay | Admitting: Gastroenterology

## 2020-12-01 ENCOUNTER — Other Ambulatory Visit (INDEPENDENT_AMBULATORY_CARE_PROVIDER_SITE_OTHER): Payer: Self-pay

## 2020-12-01 VITALS — BP 153/91 | HR 57 | Temp 98.4°F | Resp 23 | Ht <= 58 in | Wt 117.0 lb

## 2020-12-01 DIAGNOSIS — K625 Hemorrhage of anus and rectum: Secondary | ICD-10-CM

## 2020-12-01 DIAGNOSIS — K6289 Other specified diseases of anus and rectum: Secondary | ICD-10-CM

## 2020-12-01 DIAGNOSIS — D125 Benign neoplasm of sigmoid colon: Secondary | ICD-10-CM

## 2020-12-01 DIAGNOSIS — C20 Malignant neoplasm of rectum: Secondary | ICD-10-CM

## 2020-12-01 DIAGNOSIS — D122 Benign neoplasm of ascending colon: Secondary | ICD-10-CM

## 2020-12-01 DIAGNOSIS — C182 Malignant neoplasm of ascending colon: Secondary | ICD-10-CM

## 2020-12-01 LAB — BUN: BUN: 9 mg/dL (ref 6–23)

## 2020-12-01 LAB — CREATININE, SERUM: Creatinine, Ser: 0.65 mg/dL (ref 0.40–1.20)

## 2020-12-01 MED ORDER — SODIUM CHLORIDE 0.9 % IV SOLN: 500.0000 mL | Freq: Once | INTRAVENOUS | Status: DC

## 2020-12-01 NOTE — Progress Notes (Signed)
pt tolerated well. VSS. awake and to recovery. Report given to RN.  

## 2020-12-01 NOTE — Op Note (Signed)
[facility] Patient Name: Jillian Lutz Procedure Date: 12/01/2020 3:40 PM MRN: [id number] Endoscopist: Mauri Pole , MD Age: 70 Referring MD:  Date of Birth: 1950-08-16 Gender: Female Account #: [id number] Procedure:                Colonoscopy Indications:              Evaluation of unexplained GI bleeding presenting                            with Hematochezia Medicines:                Monitored Anesthesia Care Procedure:                Pre-Anesthesia Assessment:                           - Prior to the procedure, a History and Physical                            was performed, and patient medications and                            allergies were reviewed. The patient's tolerance of                            previous anesthesia was also reviewed. The risks                            and benefits of the procedure and the sedation                            options and risks were discussed with the patient.                            All questions were answered, and informed consent                            was obtained. Prior Anticoagulants: The patient has                            taken no previous anticoagulant or antiplatelet                            agents. ASA Grade Assessment: II - A patient with                            mild systemic disease. After reviewing the risks                            and benefits, the patient was deemed in                            satisfactory condition to undergo the procedure.  After obtaining informed consent, the colonoscope                            was passed under direct vision. Throughout the                            procedure, the patient's blood pressure, pulse, and                            oxygen saturations were monitored continuously. The                            Olympus PFC-[id number] (#[id number]) Colonoscope was                            introduced through the anus and advanced to  the the                            cecum, identified by appendiceal orifice and                            ileocecal valve. The colonoscopy was performed                            without difficulty. The patient tolerated the                            procedure well. The quality of the bowel                            preparation was good. The ileocecal valve,                            appendiceal orifice, and rectum were photographed. Scope In: 3:56:41 PM Scope Out: 4:19:23 PM Scope Withdrawal Time: 0 hours 18 minutes 3 seconds  Total Procedure Duration: 0 hours 22 minutes 42 seconds  Findings:                 The perianal and digital rectal examinations were                            normal.                           A 15 mm polyp was found in the ascending colon. The                            polyp was semi-pedunculated. The polyp was removed                            with a hot snare. Resection and retrieval were                            complete.  A 25 mm polyp was found in the sigmoid colon. The                            polyp was pedunculated. The polyp was removed with                            a hot snare. Resection and retrieval were complete.                           An infiltrative partially obstructing large mass                            was found in the proximal rectum. The mass was                            partially circumferential (involving one-half of                            the lumen circumference). The mass measured eight                            cm in length extending from 10 to 18cm from anal                            verge. This was biopsied with a cold forceps for                            histology. Proximal and distal opposite fold area                            of the mass lesion was tattooed with an injection                            of total 3 mL of Spot (carbon black). Complications:            No immediate  complications. Estimated Blood Loss:     Estimated blood loss was minimal. Impression:               - One 15 mm polyp in the ascending colon, removed                            with a hot snare. Resected and retrieved.                           - One 25 mm polyp in the sigmoid colon, removed                            with a hot snare. Resected and retrieved.                           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 Biopsied. Tattooed. Recommendation:           - Resume previous diet.                           - Continue present medications.                           - Await pathology results.                           - Repeat colonoscopy in 1 year for surveillance.                           - Check BUN/Cr                           - Staging CT chest, abdomen and pelvis with contrast                           - Refer to colorectal surgery and oncology based on                            path and CT results Mauri Pole, MD 12/01/2020 4:33:40 PM This report has been signed electronically.

## 2020-12-01 NOTE — Progress Notes (Signed)
Called to room to assist during endoscopic procedure.  Patient ID and intended procedure confirmed with present staff. Received instructions for my participation in the procedure from the performing physician.  

## 2020-12-01 NOTE — Patient Instructions (Signed)
YOU HAD AN ENDOSCOPIC PROCEDURE TODAY AT THE [facility]:   Refer to the procedure report that was given to you for any specific questions about what was found during the examination.  If the procedure report does not answer your questions, please call your gastroenterologist to clarify.  If you requested that your care partner not be given the details of your procedure findings, then the procedure report has been included in a sealed envelope for you to review at your convenience later.  YOU SHOULD EXPECT: Some feelings of bloating in the abdomen. Passage of more gas than usual.  Walking can help get rid of the air that was put into your GI tract during the procedure and reduce the bloating. If you had a lower endoscopy (such as a colonoscopy or flexible sigmoidoscopy) you may notice spotting of blood in your stool or on the toilet paper. If you underwent a bowel prep for your procedure, you may not have a normal bowel movement for a few days.  Please Note:  You might notice some irritation and congestion in your nose or some drainage.  This is from the oxygen used during your procedure.  There is no need for concern and it should clear up in a day or so.  SYMPTOMS TO REPORT IMMEDIATELY:   Following lower endoscopy (colonoscopy or flexible sigmoidoscopy):  Excessive amounts of blood in the stool  Significant tenderness or worsening of abdominal pains  Swelling of the abdomen that is new, acute  Fever of 100F or higher  For urgent or emergent issues, a gastroenterologist can be reached at any hour by calling [phone]. Do not use MyChart messaging for urgent concerns.    DIET:  We do recommend a small meal at first, but then you may proceed to your regular diet.  Drink plenty of fluids but you should avoid alcoholic beverages for 24 hours.  ACTIVITY:  You should plan to take it easy for the rest of today and you should NOT DRIVE or use heavy machinery until tomorrow (because  of the sedation medicines used during the test).    FOLLOW UP: Our staff will call the number listed on your records 48-72 hours following your procedure to check on you and address any questions or concerns that you may have regarding the information given to you following your procedure. If we do not reach you, we will leave a message.  We will attempt to reach you two times.  During this call, we will ask if you have developed any symptoms of COVID 19. If you develop any symptoms (ie: fever, flu-like symptoms, shortness of breath, cough etc.) before then, please call [phone].  If you test positive for Covid 19 in the 2 weeks post procedure, please call and report this information to us.    If any biopsies were taken you will be contacted by phone or by letter within the next 1-3 weeks.  Please call us at [phone] if you have not heard about the biopsies in 3 weeks.    SIGNATURES/CONFIDENTIALITY: You and/or your care partner have signed paperwork which will be entered into your electronic medical record.  These signatures attest to the fact that that the information above on your After Visit Summary has been reviewed and is understood.  Full responsibility of the confidentiality of this discharge information lies with you and/or your care-partner. 

## 2020-12-02 ENCOUNTER — Other Ambulatory Visit: Payer: Self-pay

## 2020-12-02 NOTE — Congregational Nurse Program (Signed)
Home visit.  States he is feeling good. Denies any problems including rectal bleeding.  Gave patient written instructions in [place] for CT scan scheduled for 12/06/2020.  Transportation requested through  [employer].  Jake Michaelis RN, Congregational Nurse [phone]

## 2020-12-03 ENCOUNTER — Telehealth: Payer: Self-pay

## 2020-12-03 NOTE — Telephone Encounter (Signed)
  Follow up Call-  Call back number 12/01/2020  Post procedure Call Back phone  # [name] (caretaker)  [phone]  Permission to leave phone message Yes  Some recent data might be hidden     Patient questions:  Do you have a fever, pain , or abdominal swelling? No. Pain Score  0 *  Have you tolerated food without any problems? Yes.    Have you been able to return to your normal activities? Yes.    Do you have any questions about your discharge instructions: Diet   No. Medications  No. Follow up visit  No.  Do you have questions or concerns about your Care? No.  Actions: * If pain score is 4 or above: No action needed, pain <4. 1. Have you developed a fever since your procedure? no  2.   Have you had an respiratory symptoms (SOB or cough) since your procedure? no  3.   Have you tested positive for COVID 19 since your procedure no  4.   Have you had any family members/close contacts diagnosed with the COVID 19 since your procedure?  no   If yes to any of these questions please route to Joylene John, RN and Joella Prince, RN

## 2020-12-06 ENCOUNTER — Ambulatory Visit (HOSPITAL_BASED_OUTPATIENT_CLINIC_OR_DEPARTMENT_OTHER)
Admission: RE | Admit: 2020-12-06 | Discharge: 2020-12-06 | Disposition: A | Discharge status: Home / Self Care | Payer: Self-pay | Source: Ambulatory Visit | Attending: Gastroenterology | Admitting: Gastroenterology

## 2020-12-06 ENCOUNTER — Other Ambulatory Visit: Payer: Self-pay

## 2020-12-06 DIAGNOSIS — K6289 Other specified diseases of anus and rectum: Secondary | ICD-10-CM | POA: Insufficient documentation

## 2020-12-06 DIAGNOSIS — K625 Hemorrhage of anus and rectum: Secondary | ICD-10-CM | POA: Insufficient documentation

## 2020-12-06 MED ORDER — IOHEXOL 300 MG/ML  SOLN
75.0000 mL | Freq: Once | INTRAMUSCULAR | Status: AC | PRN
  Administered 2020-12-06: 75 mL via INTRAVENOUS

## 2020-12-07 ENCOUNTER — Other Ambulatory Visit: Payer: Self-pay

## 2020-12-08 ENCOUNTER — Encounter: Payer: Self-pay | Admitting: *Deleted

## 2020-12-08 ENCOUNTER — Other Ambulatory Visit: Payer: Self-pay

## 2020-12-08 DIAGNOSIS — C20 Malignant neoplasm of rectum: Secondary | ICD-10-CM

## 2020-12-09 ENCOUNTER — Other Ambulatory Visit: Payer: Self-pay

## 2020-12-09 ENCOUNTER — Ambulatory Visit (HOSPITAL_COMMUNITY)
Admission: RE | Admit: 2020-12-09 | Discharge: 2020-12-09 | Disposition: A | Discharge status: Home / Self Care | Payer: Self-pay | Source: Ambulatory Visit | Attending: Gastroenterology | Admitting: Gastroenterology

## 2020-12-09 ENCOUNTER — Other Ambulatory Visit: Payer: Self-pay | Admitting: Gastroenterology

## 2020-12-09 DIAGNOSIS — C20 Malignant neoplasm of rectum: Secondary | ICD-10-CM | POA: Insufficient documentation

## 2020-12-14 ENCOUNTER — Other Ambulatory Visit (HOSPITAL_COMMUNITY): Payer: Self-pay

## 2020-12-14 MED FILL — Lisinopril & Hydrochlorothiazide Tab 20-12.5 MG: ORAL | 30 days supply | Qty: 60 | Fill #1 | Status: AC

## 2020-12-14 MED FILL — Metformin HCl Tab ER 24HR 500 MG: ORAL | 30 days supply | Qty: 30 | Fill #1 | Status: AC

## 2020-12-14 MED FILL — Amlodipine Besylate Tab 5 MG (Base Equivalent): ORAL | 30 days supply | Qty: 30 | Fill #1 | Status: AC

## 2020-12-15 ENCOUNTER — Other Ambulatory Visit: Payer: Self-pay

## 2020-12-15 ENCOUNTER — Telehealth: Payer: Self-pay

## 2020-12-15 DIAGNOSIS — C20 Malignant neoplasm of rectum: Secondary | ICD-10-CM

## 2020-12-15 NOTE — Telephone Encounter (Signed)
Thank Beth. I sent you result note earlier this morning.

## 2020-12-15 NOTE — Telephone Encounter (Signed)
Jillian Lutz I think Dr. Silverio Decamp is here all day today. Since she knows this case I will let her relay her opinion. Patient has stage 3 disease based on MRI  Eden Roc, FYI

## 2020-12-15 NOTE — Telephone Encounter (Signed)
Result note reviewed.  Message sent to Valda Favia, RN of the [facility]. Referral to [facility] and referral to [facility] marked urgent.

## 2020-12-15 NOTE — Telephone Encounter (Signed)
Doctor of the Day This is a patient of Dr Woodward Ku who speaks [place] She was recently diagnosed with rectal cancer. An MRI has been done for staging.  The Congregational Nurse Jake Michaelis, RN and the interpreter will make a home visit with the patient this afternoon. Would you review the MRI results? The patient will want to know if the cancer is contained or metastatic. Oncology and Surgeon are waiting for the results to determine who the patient will need to see first.   Thank you

## 2020-12-16 ENCOUNTER — Other Ambulatory Visit: Payer: Self-pay | Admitting: Student

## 2020-12-16 ENCOUNTER — Telehealth: Payer: Self-pay | Admitting: Nurse Practitioner

## 2020-12-16 DIAGNOSIS — C20 Malignant neoplasm of rectum: Secondary | ICD-10-CM

## 2020-12-16 NOTE — Telephone Encounter (Signed)
-----   Message from Greggory Keen, LPN sent at 3/0/0923 11:43 AM EDT ----- Needing an urgent referral to [facility] for stage 3 rectal cancer. Biopsy report, MRI for staging and colonoscopy report are all in Epic. Thank you Beth [phone] direct line

## 2020-12-16 NOTE — Telephone Encounter (Signed)
[facility] has the referral as well. Ms. Jake Michaelis, RN of Congregational Nurses is involved with coordinating care and transportation. She is aware of the referrals.

## 2020-12-16 NOTE — Telephone Encounter (Signed)
Good afternoon! I have placed an urgent referral to [facility]. Please let us know if there's anything else we need to do.  Thanks, Sanjuan Dame

## 2020-12-16 NOTE — Telephone Encounter (Signed)
Rec'd Call from [place].  MCK[facility] requesting an Urgent Referral to [facility] as per protocol this patient has the [facility]) and will need to have her Referral sent and faxed from her PCP's office.   [facility] will only accept this Request from her PCP's office due the patient having an [employer]).  Please advise if a Referral can be placed as soon as possible to start this Request.

## 2020-12-16 NOTE — Telephone Encounter (Signed)
Thank you :)

## 2020-12-16 NOTE — Telephone Encounter (Signed)
Received a new pt referral from Dr. Silverio Decamp for rectal cancer. Jillian Lutz has been scheduled to see [name] on 6/8 at 11am. I was unable to reach the pt's interpreter.Sent a msg to the GI to update.

## 2020-12-16 NOTE — Congregational Nurse Program (Signed)
Home visit to deliver mediations which were picked up from [facility].  These included Amlodipine 5 mg, Lisinopril/HCTZ 20/12.5 mg and Metformin 50 mg.  Told her that [facility] called today and she has an appointment with Dr. Kyung Rudd, Radiology Oncologist on Tues. December 21, 2020 at 9:30 am to discuss treatment plan for her colorectal cancer.  CN will request transportation with [facility] and also ask for Y'Hin Lievanos to interpret.  Jake Michaelis RN, Congregational Nurse [phone]

## 2020-12-17 NOTE — Progress Notes (Signed)
I attempted to reach the patient at number listed in the chart, no answer and no voicemail set up.  I also attempted to reach the second contact person who I understand is her interpreter and the message stated that phone number is not in service.  I have mailed the patient a letter today regarding her appointment with Cira Rue NP/Dr. [name] on Wednesday 6/8 to arrive by 10:40 am.

## 2020-12-20 NOTE — Progress Notes (Signed)
GI Location of Tumor / Histology: Rectal Cancer   MRI Pelvis 12/09/2020: 4.9 cm circumferential mid/lower rectal mass, corresponding to the patient's newly diagnosed rectal cancer.  Rectal adenocarcinoma T stage: T3c  Rectal adenocarcinoma N stage:  N1  Distance from tumor to the internal anal sphincter is 3.7 cm.  CT CAP 12/06/2020: There is partially circumferential soft tissue thickening of the mid to superior rectum, approximately 5 cm in length and the inferior extent approximately 6 cm above the anal verge, consistent with primary rectal malignancy identified by colonoscopy. 2. There appear to be abnormally enlarged perirectal lymph nodes posteriorly about the superior rectum measuring up to 1.0 x 0.8 cm. Findings are suspicious for perirectal nodal metastatic disease, however rectal MRI is the test of choice for initial local staging of rectal cancer. 3. There is a 4 mm nonspecific pulmonary nodule of the superior segment left lower lobe, statistically most likely incidental, infectious or inflammatory, although nonspecific and isolated metastatic disease is not strictly excluded. Attention on follow-up.  Biopsies of Rectal Mass 12/05/2020   Past/Anticipated interventions by surgeon, if any:   Past/Anticipated interventions by medical oncology, if any:  Dr. Lennie Odor NP 12/22/2020    Weight changes, if any: Feels like she has lost some weight, unsure how many pounds.  Bowel/Bladder complaints, if any: No  Nausea / Vomiting, if any: No  Pain issues, if any: She has pain and pressure in her rectum, taking tylenol.  Any blood per rectum: Has small amounts of bright red blood with bowel movements.  SAFETY ISSUES:  Prior radiation? No  Pacemaker/ICD?No   Possible current pregnancy? Postmenopausal  Is the patient on methotrexate? No  Current Complaints/Details: -Speaks [place]

## 2020-12-21 ENCOUNTER — Encounter: Payer: Self-pay | Admitting: Radiation Oncology

## 2020-12-21 ENCOUNTER — Other Ambulatory Visit: Payer: Self-pay

## 2020-12-21 ENCOUNTER — Ambulatory Visit
Admission: RE | Admit: 2020-12-21 | Discharge: 2020-12-21 | Disposition: A | Discharge status: Home / Self Care | Payer: Self-pay | Source: Ambulatory Visit | Attending: Radiation Oncology | Admitting: Radiation Oncology

## 2020-12-21 VITALS — BP 117/74 | HR 66 | Temp 97.4°F | Resp 18 | Ht <= 58 in | Wt 117.8 lb

## 2020-12-21 DIAGNOSIS — I119 Hypertensive heart disease without heart failure: Secondary | ICD-10-CM | POA: Insufficient documentation

## 2020-12-21 DIAGNOSIS — I700 Atherosclerosis of aorta: Secondary | ICD-10-CM | POA: Insufficient documentation

## 2020-12-21 DIAGNOSIS — C20 Malignant neoplasm of rectum: Secondary | ICD-10-CM | POA: Insufficient documentation

## 2020-12-21 DIAGNOSIS — C182 Malignant neoplasm of ascending colon: Secondary | ICD-10-CM | POA: Insufficient documentation

## 2020-12-21 DIAGNOSIS — K5730 Diverticulosis of large intestine without perforation or abscess without bleeding: Secondary | ICD-10-CM | POA: Insufficient documentation

## 2020-12-21 DIAGNOSIS — Z7984 Long term (current) use of oral hypoglycemic drugs: Secondary | ICD-10-CM | POA: Insufficient documentation

## 2020-12-21 DIAGNOSIS — I10 Essential (primary) hypertension: Secondary | ICD-10-CM | POA: Insufficient documentation

## 2020-12-21 DIAGNOSIS — K625 Hemorrhage of anus and rectum: Secondary | ICD-10-CM | POA: Insufficient documentation

## 2020-12-21 DIAGNOSIS — Z79899 Other long term (current) drug therapy: Secondary | ICD-10-CM | POA: Insufficient documentation

## 2020-12-21 DIAGNOSIS — R911 Solitary pulmonary nodule: Secondary | ICD-10-CM | POA: Insufficient documentation

## 2020-12-21 NOTE — Progress Notes (Addendum)
Jillian Lutz  [phone] Fax:([phone]) [place] Note   Patient Care Team: Gaylan Gerold, DO as PCP - General (Internal Medicine) Jonnie Finner, RN as Oncology Nurse Navigator Alla Feeling, NP as Nurse Practitioner (Nurse Practitioner) [name], MD as Consulting Physician (Hematology and Oncology) Kyung Rudd, MD as Consulting Physician (Radiation Oncology) 12/22/2020  CHIEF COMPLAINTS/PURPOSE OF CONSULTATION:  Rectal cancer, referred by GI Dr. Harl Lutz  SUMMARY OF ONCOLOGY HISTORY  Oncology History  Rectal adenocarcinoma ([place])  08/26/2020 Miscellaneous   Initial presentation to PCP, reporting intermittent BRBPR since 02/2020    12/01/2020 Procedure   Colonoscopy by Dr. Silverio Lutz findings - The perianal and digital rectal examinations were normal. - A 15 mm polyp was found in the ascending colon. The polyp was semi-pedunculated. Resection and retrieval were complete. - A 25 mm polyp was found in the sigmoid colon. The polyp was pedunculated. Resection and retrieval were complete. - An infiltrative partially obstructing large mass was found in the proximal rectum. The mass was partially circumferential (involving one-half of the lumen circumference). The mass measured eight cm in length extending from 10 to 18cm from anal verge. This was biopsied with a cold forceps for histology. Proximal and distal opposite fold area of the mass lesion was tattooed with an injection of total 3 mL of Spot (carbon black).   12/01/2020 Initial Biopsy   Diagnosis 1. Colon, polyp(s), ascending x 1 - ADENOCARCINOMA ARISING IN A TUBULAR ADENOMA WITH HIGH-GRADE DYSPLASIA. SEE NOTE 2. Colon, sigmoid polyp, x1 - TUBULOVILLOUS ADENOMA(S) - NEGATIVE FOR HIGH-GRADE DYSPLASIA OR MALIGNANCY 3. Rectum, biopsy - ADENOCARCINOMA. SEE NOTE   12/01/2020 Cancer Staging   Cancer Staging Rectal adenocarcinoma [facility]) Staging form: Colon and Rectum, AJCC 8th  Edition - Clinical stage from 12/21/2020: Stage IIIB (cT3, cN1, cM0) - Unsigned    12/06/2020 Imaging   CT CAP with contrast IMPRESSION: 1. There is partially circumferential soft tissue thickening of the mid to superior rectum, approximately 5 cm in length and the inferior extent approximately 6 cm above the anal verge, consistent with primary rectal malignancy identified by colonoscopy. 2. There appear to be abnormally enlarged perirectal lymph nodes posteriorly about the superior rectum measuring up to 1.0 x 0.8 cm. Findings are suspicious for perirectal nodal metastatic disease, however rectal MRI is the test of choice for initial local staging of rectal cancer. 3. There is a 4 mm nonspecific pulmonary nodule of the superior segment left lower lobe, statistically most likely incidental, infectious or inflammatory, although nonspecific and isolated metastatic disease is not strictly excluded. Attention on follow-up. 4. No other evidence of metastatic disease in the chest, abdomen, or pelvis. Aortic Atherosclerosis (ICD10-I70.0).   12/09/2020 Imaging   Local staging MRI pelvis without contrast IMPRESSION: 4.9 cm circumferential mid/lower rectal mass, corresponding to the patient's newly diagnosed rectal cancer. Rectal adenocarcinoma T stage: T3c Rectal adenocarcinoma N stage:  N1 Distance from tumor to the internal anal sphincter is 3.7 cm.   12/21/2020 Initial Diagnosis   Rectal adenocarcinoma ([facility])     HISTORY OF PRESENTING ILLNESS:  Jillian Lutz 70 y.o. female is here because of newly diagnosed rectal cancer.  She initially noticed BRBPR starting in 02/2020, brought it to her PCPs attention in 08/2020 along with unintentional weight loss, at the time Hg 11.4, ferritin 259, normal B12 and folate.  FOBT was negative.  She was referred to GI, seen 09/30/2020 and referred for colonoscopy which she underwent on 12/01/2020 by Dr. Silverio Lutz.  The perianal and digital rectal exams were normal;  a 15 mm semi-- pedunculated polyp was found in the ascending colon which was completely resected, a 25 mm polyp was found in the sigmoid colon, and she was found to have an infiltrative partially obstructing large mass in the proximal rectum involving one half of the lumen circumference, measuring 8 cm in length extending from 10-18 cm from anal verge.  The sigmoid colon polyp showed tubulovillous adenoma, negative for high-grade dysplasia or malignancy.  Pathology on the ascending colon polyp and the rectum biopsies both showed adenocarcinoma.  Staging CT CAP on 12/06/2020 showed partially circumferential soft tissue thickening of the mid to superior rectum consistent with the primary rectal cancer, additionally seen enlarged perirectal lymph nodes posteriorly about the superior rectum measuring up to 1.0 x 0.8 cm, suspicious for perirectal nodal disease.  There was a 4 mm nonspecific pulmonary nodule in the left lower lobe, no other definite evidence of metastatic disease.  MR pelvis for local staging on 12/09/2020 showed a 4.9 cm circumferential mid/lower rectal mass (3.7 cm from tumor to the internal anal sphincter) extending through the muscularis propria and 3 left perirectal nodes, staged cT3cN1, IIIB. She was seen by radiation oncology 12/21/20.   Socially, she is married, lives in her spouse in the [place] since 2014.  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  Currently she works in a Proofreader.  She is independent with ADLs, she does not drive, no driver's license.  She has an orange card.  Her children ages 45-50 live in [place], she would need to sponsor them if needed to come over to the [place] to help care for her.  Denies alcohol, drug, or tobacco use.  She has never had prior colonoscopy, Pap, or mammogram cancer screenings.  She denies family history of cancer.  She was recently told she has diabetes and  hypertension.  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  Today, she presents with an interpreter.  She has constipation, mild rectal bleeding, and pain with bowel movements.  Her energy and appetite are slightly decreased, she continues working.  She has had about 5 pounds unintentional weight loss.  Denies abdominal pain, fever, chills, cough, chest pain, dyspnea.     MEDICAL HISTORY:  Past Medical History:  Diagnosis Date  . Bilateral wrist pain 10/25/2017  . Hypertension   . Neck mass 1998   Unknown biopsy results. Excised in [place].  . Trigger finger, acquired 02/08/2017    SURGICAL HISTORY: Past Surgical History:  Procedure Laterality Date  . NECK SURGERY Left 1998   Excision of mass in [place]    SOCIAL HISTORY: Social History   Socioeconomic History  . Marital status: Married    Spouse name: Scientist, product/process development  . Number of children: Not on file  . Years of education: Not on file  . Highest education level: Not on file  Occupational History  . Not on file  Tobacco Use  . Smoking status: Never Smoker  . Smokeless tobacco: Never Used  Substance and Sexual Activity  . Alcohol use: No  . Drug use: No  . Sexual activity: Not on file  Other Topics Concern  . Not on file  Social History Narrative   Came to [place] from [place] in 2013.   Does not know much family history [place] medical care.   Social Determinants of Health   Financial Resource Strain: Not on file  Food Insecurity: Not on file  Transportation Needs: Not on file  Physical Activity: Not on file  Stress: Not on file  Social Connections: Not on file  Intimate Partner Violence: Not At Risk  . Fear of Current or Ex-Partner: No  . Emotionally Abused: No  . Physically Abused: No  . Sexually Abused: No    FAMILY HISTORY: Family History  Problem Relation Age of Onset  . Headache Son     ALLERGIES:  has No Known  Allergies.  MEDICATIONS:  Current Outpatient Medications  Medication Sig Dispense Refill  . amLODipine (NORVASC) 5 MG tablet TAKE 1 TABLET (5 MG TOTAL) BY MOUTH DAILY. 90 tablet 3  . lisinopril-hydrochlorothiazide (ZESTORETIC) 20-12.5 MG tablet TAKE 2 TABLETS BY MOUTH DAILY. 60 tablet 3  . metFORMIN (GLUCOPHAGE-XR) 500 MG 24 hr tablet TAKE 1 TABLET (500 MG TOTAL) BY MOUTH DAILY WITH BREAKFAST. 30 tablet 5  . gabapentin (NEURONTIN) 300 MG capsule Take 2 capsules (600 mg total) by mouth at bedtime. (Patient not taking: No sig reported) 60 capsule 2   No current facility-administered medications for this visit.    REVIEW OF SYSTEMS:   Constitutional: Denies fevers, chills or abnormal night sweats (+) 5 pounds weight loss (+) mild fatigue Eyes: Denies blurriness of vision, double vision or watery eyes Ears, nose, mouth, throat, and face: Denies mucositis or sore throat Respiratory: Denies cough, dyspnea or wheezes Cardiovascular: Denies palpitation, chest discomfort or lower extremity swelling Gastrointestinal:  Denies nausea/vomiting, diarrhea, heartburn (+) constipation (+) rectal bleeding (+) rectal pain Skin: Denies abnormal skin rashes Lymphatics: Denies new lymphadenopathy or easy bruising Neurological:Denies new weaknesses (+) neuropathy, hands Behavioral/Psych: Mood is stable, no new changes  All other systems were reviewed with the patient and are negative.  PHYSICAL EXAMINATION: ECOG PERFORMANCE STATUS: 1 - Symptomatic but completely ambulatory  Vitals:   12/22/20 1045  BP: 126/70  Pulse: 63  Resp: 17  Temp: (!) 97.5 F (36.4 C)  SpO2: 100%   Filed Weights   12/22/20 1045  Weight: 117 lb 12.8 oz (53.4 kg)    GENERAL:alert, no distress and comfortable SKIN: No rash EYES:  sclera clear LUNGS:  normal breathing effort HEART:  no lower extremity edema PSYCH: alert & oriented x 3 with fluent speech NEURO: no focal motor/sensory deficits Rectal exam  deferred  LABORATORY DATA:  I have reviewed the data as listed CBC Latest Ref Rng & Units 12/22/2020 08/26/2020 01/06/2020  WBC 4.0 - 10.5 K/uL 6.6 6.3 -  Hemoglobin 12.0 - 15.0 g/dL 12.5 11.4 -  Hematocrit 36.0 - 46.0 % 37.9 36.4 41.8  Platelets 150 - 400 K/uL 221 247 -   CMP Latest Ref Rng & Units 12/22/2020 12/01/2020 09/29/2019  Glucose 70 - 99 mg/dL 97 - 124(H)  BUN 8 - 23 mg/dL '14 9 12  ' Creatinine 0.44 - 1.00 mg/dL 0.70 0.65 0.80  Sodium 135 - 145 mmol/L 139 - 139  Potassium 3.5 - 5.1 mmol/L 3.8 - 3.6  Chloride 98 - 111 mmol/L 103 - 98  CO2 22 - 32 mmol/L 27 - 27  Calcium 8.9 - 10.3 mg/dL 9.0 - 9.9  Total Protein 6.5 - 8.1 g/dL 7.0 - -  Total Bilirubin 0.3 - 1.2 mg/dL 0.3 - -  Alkaline Phos 38 - 126 U/L 54 - -  AST 15 - 41 U/L 15 - -  ALT 0 - 44 U/L 6 - -    RADIOGRAPHIC STUDIES: I have personally reviewed the radiological images as listed and agreed with the findings in the report. MR PELVIS WO CONTRAST  Result Date: 12/11/2020 CLINICAL DATA:  Rectal cancer EXAM: MRI PELVIS WITHOUT CONTRAST TECHNIQUE: Multiplanar multisequence MR imaging of the pelvis was performed. No intravenous contrast was administered. Ultrasound gel was administered per rectum to optimize tumor evaluation. COMPARISON:  CT abdomen/pelvis dated 12/06/2020 FINDINGS: TUMOR LOCATION Tumor distance from Anal Verge/Skin Surface:  5.9 cm Tumor distance to Internal Anal Sphincter: 3.7 cm TUMOR DESCRIPTION Circumferential Extent: Circumferential Tumor Length: 4.9 cm T - CATEGORY Extension through Muscularis Propria: Yes 6-74m=T3c, with 6 mm extension at the 7 o'clock position (series 7/image 31) and 8 mm extension at the 3 o'clock position (series 7/image 28) Shortest Distance of any tumor/node from Mesorectal Fascia: 3 mm Extramural Vascular Invasion/Tumor Thrombus: No Invasion of Anterior Peritoneal Reflection: No Involvement of Adjacent Organs or Pelvic Sidewall: No Levator Ani Involvement: No N - CATEGORY Mesorectal  Lymph Nodes >=523m 1-3=N1, with 3 left perirectal nodes measuring 6-8 mm short axis (series 7/images 12, 14, and 16) Extra-mesorectal Lymphadenopathy: No Other:  None. IMPRESSION: 4.9 cm circumferential mid/lower rectal mass, corresponding to the patient's newly diagnosed rectal cancer. Rectal adenocarcinoma T stage: T3c Rectal adenocarcinoma N stage:  N1 Distance from tumor to the internal anal sphincter is 3.7 cm. Electronically Signed   By: SrJulian Hy.D.   On: 12/11/2020 07:28   CT CHEST ABDOMEN PELVIS W CONTRAST  Result Date: 12/07/2020 CLINICAL DATA:  Rectal mass identified by colonoscopy, new diagnosis rectal cancer, staging EXAM: CT CHEST, ABDOMEN, AND PELVIS WITH CONTRAST TECHNIQUE: Multidetector CT imaging of the chest, abdomen and pelvis was performed following the standard protocol during bolus administration of intravenous contrast. CONTRAST:  7546mMNIPAQUE IOHEXOL 300 MG/ML SOLN, additional oral enteric contrast COMPARISON:  None. FINDINGS: CT CHEST FINDINGS Cardiovascular: No significant vascular findings. Cardiomegaly. No pericardial effusion. Mediastinum/Nodes: No enlarged mediastinal, hilar, or axillary lymph nodes. Thyroid gland, trachea, and esophagus demonstrate no significant findings. Lungs/Pleura: There is a 4 mm nonspecific pulmonary nodule of the superior segment left lower lobe (series 4, image 72). No pleural effusion or pneumothorax. Musculoskeletal: No chest wall mass or suspicious bone lesions identified. CT ABDOMEN PELVIS FINDINGS Hepatobiliary: No solid liver abnormality is seen. No gallstones, gallbladder wall thickening, or biliary dilatation. Pancreas: Unremarkable. No pancreatic ductal dilatation or surrounding inflammatory changes. Spleen: Normal in size without significant abnormality. Adrenals/Urinary Tract: Adrenal glands are unremarkable. Kidneys are normal, without renal calculi, solid lesion, or hydronephrosis. Bladder is unremarkable. Stomach/Bowel: Stomach  is within normal limits. Large incidental diverticulum of the descending duodenum. Appendix appears normal. There is partially circumferential soft tissue thickening of the mid to superior rectum, approximately 5 cm in length and the inferior extent approximately 6 cm above the anal verge (series 6, image 66). Vascular/Lymphatic: Aortic atherosclerosis. There appear to be abnormally enlarged perirectal lymph nodes posteriorly about the superior rectum measuring up to 1.0 x 0.8 cm (series 2, image 91). No other enlarged abdominal or pelvic lymph nodes. Reproductive: No mass or other abnormality. Other: No abdominal wall hernia or abnormality. No abdominopelvic ascites. Musculoskeletal: No acute or significant osseous findings. IMPRESSION: 1. There is partially circumferential soft tissue thickening of the mid to superior rectum, approximately 5 cm in length and the inferior extent approximately 6 cm above the anal verge, consistent with primary rectal malignancy identified by colonoscopy. 2. There appear to be  abnormally enlarged perirectal lymph nodes posteriorly about the superior rectum measuring up to 1.0 x 0.8 cm. Findings are suspicious for perirectal nodal metastatic disease, however rectal MRI is the test of choice for initial local staging of rectal cancer. 3. There is a 4 mm nonspecific pulmonary nodule of the superior segment left lower lobe, statistically most likely incidental, infectious or inflammatory, although nonspecific and isolated metastatic disease is not strictly excluded. Attention on follow-up. 4. No other evidence of metastatic disease in the chest, abdomen, or pelvis. Aortic Atherosclerosis (ICD10-I70.0). Electronically Signed   By: Eddie Candle M.D.   On: 12/07/2020 15:19    ASSESSMENT & PLAN: 70 year old [place] female  1.  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  Ascending colon polyp and rectal biopsies both showed adenocarcinoma.  Per the report, the ascending colon biopsy was completely retrieved and resected. -Staging work-up shows perirectal lymph node involvement and a single nonspecific left pulmonary nodule.  We discussed she has at least locally advanced disease, which is curable with multimodal approach. -she does not have severe bleeding or obstruction, we do not recommend upfront surgery. -Given the local nodal metastasis and synchronous colon cancer in a polyp arising from tubular adenoma, she is at high risk for recurrence and new colon cancer.   -for Stage IIIB rectal cancer we recommend the standard of care which is total neoadjuvant chemo including 4 months of FOLFOX (vs Capox) followed by 6 weeks concurrent chemoradiation with Xeloda, followed by surgery then surveillance. -We explained the potential side effects, rationale, potential benefit, and logistics of FOLFOX and Capox in great detail.  She repeatedly declined intravenous chemo but is agreeable to oral Xeloda. -GIven that she will forego oxaliplatin based chemo, Dr. Burr Medico recommends neoadjuvant concurrent chemoradiation with Xeloda, followed by surgery and 4-5 months of adjuvant Xeloda to reduce her recurrence risk.  -Chemotherapy consent: Side effects of Xeloda including but not limited to fatigue, nausea/vomiting, diarrhea, mucositis, hand-foot syndrome, rash, neuropathy, fluid retention, renal and kidney dysfunction, neutropenic fever, need for blood transfusion, and bleeding were discussed with patient in great detail. She agrees to proceed. -She will attend a chemo class -Baseline labs 12/22/20 show elevated CEA  19, low MCV without anemia, normal ferritin/iron, and normal CMP.  Will monitor closely on chemo -Follow-up with week 1, anticipate start date of ccRT 01/03/2021, will coordinate with Dr. Lisbeth Renshaw -lab weekly on treatment, f/up every other week of ccRT for now -Surgical referral is pending.   2. Rectal pain and bleeding, constipation, weight loss -secondary to #1 -CBC shows no anemia, iron studies are normal -we discussed symptom management for constipation, I recommend to start colace and miralax 1-2 x daily.   3. Social support -She is here with her husband, moved from [place] in 2014.  Her children remain in [place], spanning ages 71-50 -I reviewed her children's risk for CRC and the need to begin screening by at least age 87.  They do not have reliable colonoscopy screenings in [place] -Ms. 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  She plans to prepare certain documents -She has orange card, understands that Xeloda may be very expensive, and we could likely get intravenous chemo for free.  She still declined IV chemo and prefers Xeloda.  She met our oral chemo pharmacist Wells Guiles today to begin financial assistance process -She does not have a driver's license, she was referred to social work for transportation and financial support  4.  HTN, DM, pre-existing neuropathy -On lisinopril, amlodipine, metformin, and gabapentin.  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 CBC with Differential (Cancer Center Only)    Standing Status:   Standing    Number of Occurrences:   50    Standing Expiration Date:   12/22/2021  . CEA (IN HOUSE-[facility])FOR [facility] ONLY    Standing Status:   Standing    Number of Occurrences:   50    Standing Expiration Date:   12/22/2021  . CMP ([facility] only)    Standing Status:   Standing    Number of Occurrences:   50    Standing Expiration Date:   12/22/2021  . Ferritin    Standing Status:   Standing    Number of Occurrences:   50    Standing Expiration Date:   12/22/2021  . Iron and TIBC    Standing Status:   Standing    Number of Occurrences:   50    Standing Expiration Date:   12/22/2021  . Ambulatory referral to Social Work    Referral Priority:   Routine    Referral Type:   Consultation    Referral Reason:   Specialty Services Required    Number of Visits Requested:   1    All questions were answered. The patient knows to call the clinic with any problems, questions or concerns.     Alla Feeling, NP 12/22/2020   Addendum  I have seen the patient, examined her. I agree with the assessment and and plan and have edited the notes.   Mr Dobler is a 70 yo female from [place], was referred for newly diagnosed stage III rectal cancer, and likely early stage right ascending colon cancer arising from adenoma.  I reviewed her biopsy results, CT scan findings in detail.  I recommend total neoadjuvant chemotherapy and radiation for her stage III rectal cancer due to high risk of recurrence with surgery alone.  However despite lengthy discussion about benefit, potential side effect about chemotherapy, she declined intravenous chemo.  She agrees with oral chemo and radiation, followed by surgery and adjuvant Xeloda.  So we will plan to proceed with concurrent chemoradiation with oral chemo capecitabine.  She will need financial  assistance for capecitabine due to lack of insurance coverage.  Our oral pharmacist Wells Guiles met her today, and will start this process.  If things go well, plan to start concurrent chemoradiation on June 20. Will obtain lab today. We made referral to [facility] today.  [name]  12/22/2020

## 2020-12-21 NOTE — Progress Notes (Signed)
Radiation Oncology         [phone] ________________________________  Name: Jillian Lutz        MRN: [id number]  Date of Service: 12/21/2020 DOB: 30-Apr-1951  CH:ENIDPO, Alease Frame, DO  [name], MD     REFERRING PHYSICIAN: [name], MD   DIAGNOSIS: The primary encounter diagnosis was Rectal cancer [facility]). A diagnosis of Rectal adenocarcinoma ([place]) was also pertinent to this visit.    HISTORY OF PRESENT ILLNESS: Jillian Lutz is a 70 y.o. female seen at the request of Dr. 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  The patient was originally evaluated in March 2022 by GI due to rectal bleeding that had occurred since August 2021.  She was counseled on the rationale for colonoscopy and underwent this procedure on 12/01/2020, evaluation revealed a 15 mm polyp in the ascending colon, a 25 mm polyp in the sigmoid colon and an infiltrative partially obstructing large mass in the proximal rectum measuring 10 to 18 cm from the anal verge, the lesion involves one half of the luminal circumference but no obstruction was seen.  The site was tattooed.  Final pathology reveals adenocarcinoma in the rectal tumor, as well as adenocarcinoma arising in the ascending colon polyp.  The other polyp was benign in the sigmoid colon.  She has undergone staging CT scan on 12/06/2020 showing a 5 cm area of thickening in the superior rectum with perirectal adenopathy the largest measuring up to 1 cm.  A 4 mm nonspecific pulmonary nodule in the left lower lobe was identified.  No other evidence suspicious for metastatic disease were noted.  The patient underwent an MRI of the pelvis without contrast on 12/09/2020, the study was limited because of the lack of contrast but her tumor was staged as T3c based on extension and the muscularis propria, 3 perirectal nodes staged as an 1 disease, the tumor was 3.7 cm from the  internal anal sphincter, and the extent of the lesion was 4.9 cm circumferentially in the mid to lower rectum.  Given these findings she seen to discuss treatment recommendations for her cancer.  She is scheduled to meet with Dr. Burr Medico tomorrow.   PREVIOUS RADIATION THERAPY: No   PAST MEDICAL HISTORY:  Past Medical History:  Diagnosis Date  . Bilateral wrist pain 10/25/2017  . Hypertension   . Neck mass 1998   Unknown biopsy results. Excised in [place].  . Trigger finger, acquired 02/08/2017       PAST SURGICAL HISTORY: Past Surgical History:  Procedure Laterality Date  . NECK SURGERY Left 1998   Excision of mass in [place]     FAMILY HISTORY:  Family History  Problem Relation Age of Onset  . Headache Son      SOCIAL HISTORY:  reports that she has never smoked. She has never used smokeless tobacco. She reports that she does not drink alcohol and does not use drugs. 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 She does not drive. She is active in a church congregation and is accompanied by a Scientist, research (physical sciences) as well. She does not have family here in the [place].    ALLERGIES: Patient has no known allergies.   MEDICATIONS:  Current Outpatient Medications  Medication Sig Dispense Refill  . amLODipine (NORVASC) 5 MG tablet TAKE 1 TABLET (5 MG  TOTAL) BY MOUTH DAILY. 90 tablet 3  . lisinopril-hydrochlorothiazide (ZESTORETIC) 20-12.5 MG tablet TAKE 2 TABLETS BY MOUTH DAILY. 60 tablet 3  . metFORMIN (GLUCOPHAGE-XR) 500 MG 24 hr tablet TAKE 1 TABLET (500 MG TOTAL) BY MOUTH DAILY WITH BREAKFAST. 30 tablet 5  . amoxicillin-clavulanate (AUGMENTIN) 500-125 MG tablet TAKE 1 TABLET BY MOUTH EVERY 12 HOURS. (Patient not taking: No sig reported) 10 tablet 0  . gabapentin (NEURONTIN) 300 MG capsule Take 2 capsules (600 mg total) by mouth at bedtime. (Patient not taking: No sig reported) 60 capsule 2   No current  facility-administered medications for this encounter.     REVIEW OF SYSTEMS: On review of systems, the patient reports that She is doing well overall. She does have rectal bleeding with most bowel movements. She has lost weight unintentinoally but is unsure how much. She has had some pressure and discomfort in the rectum as well. Otherwise, she denies changes of stool caliber or consistency.   PHYSICAL EXAM:  Wt Readings from Last 3 Encounters:  12/21/20 117 lb 12.8 oz (53.4 kg)  12/01/20 117 lb (53.1 kg)  09/30/20 117 lb 12.8 oz (53.4 kg)   Temp Readings from Last 3 Encounters:  12/21/20 (!) 97.4 F (36.3 C)  12/01/20 98.4 F (36.9 C)  09/24/20 97.9 F (36.6 C) (Oral)   BP Readings from Last 3 Encounters:  12/21/20 117/74  12/01/20 (!) 153/91  09/30/20 130/68   Pulse Readings from Last 3 Encounters:  12/21/20 66  12/01/20 (!) 57  09/30/20 76   Pain Assessment Pain Score: 0-No pain/10  In general this is a well appearing asian female in no acute distress. She's alert and oriented x4 and appropriate throughout the examination. Cardiopulmonary assessment is negative for acute distress and she exhibits normal effort.     ECOG = 1  0 - Asymptomatic (Fully active, able to carry on all predisease activities without restriction)  1 - Symptomatic but completely ambulatory (Restricted in physically strenuous activity but ambulatory and able to carry out work of a light or sedentary nature. For example, light housework, office work)  2 - Symptomatic, <50% in bed during the day (Ambulatory and capable of all self care but unable to carry out any work activities. Up and about more than 50% of waking hours)  3 - Symptomatic, >50% in bed, but not bedbound (Capable of only limited self-care, confined to bed or chair 50% or more of waking hours)  4 - Bedbound (Completely disabled. Cannot carry on any self-care. Totally confined to bed or chair)  5 - Death   Eustace Pen MM, Creech RH,  Tormey DC, et al. [phone]). "Toxicity and response criteria of the [facility]". [place] Oncol. 5 (6): 649-55    LABORATORY DATA:  Lab Results  Component Value Date   WBC 6.3 08/26/2020   HGB 11.4 08/26/2020   HCT 36.4 08/26/2020   MCV 81 08/26/2020   PLT 247 08/26/2020   Lab Results  Component Value Date   NA 139 09/29/2019   K 3.6 09/29/2019   CL 98 09/29/2019   CO2 27 09/29/2019   Lab Results  Component Value Date   ALT 8 06/22/2016   AST 15 06/22/2016   ALKPHOS 64 06/22/2016   BILITOT 0.3 06/22/2016      RADIOGRAPHY: MR PELVIS WO CONTRAST  Result Date: 12/11/2020 CLINICAL DATA:  Rectal cancer EXAM: MRI PELVIS WITHOUT CONTRAST TECHNIQUE: Multiplanar multisequence MR imaging of the pelvis was performed. No intravenous  contrast was administered. Ultrasound gel was administered per rectum to optimize tumor evaluation. COMPARISON:  CT abdomen/pelvis dated 12/06/2020 FINDINGS: TUMOR LOCATION Tumor distance from Anal Verge/Skin Surface:  5.9 cm Tumor distance to Internal Anal Sphincter: 3.7 cm TUMOR DESCRIPTION Circumferential Extent: Circumferential Tumor Length: 4.9 cm T - CATEGORY Extension through Muscularis Propria: Yes 6-52mm=T3c, with 6 mm extension at the 7 o'clock position (series 7/image 31) and 8 mm extension at the 3 o'clock position (series 7/image 28) Shortest Distance of any tumor/node from Mesorectal Fascia: 3 mm Extramural Vascular Invasion/Tumor Thrombus: No Invasion of Anterior Peritoneal Reflection: No Involvement of Adjacent Organs or Pelvic Sidewall: No Levator Ani Involvement: No N - CATEGORY Mesorectal Lymph Nodes >=59mm: 1-3=N1, with 3 left perirectal nodes measuring 6-8 mm short axis (series 7/images 12, 14, and 16) Extra-mesorectal Lymphadenopathy: No Other:  None. IMPRESSION: 4.9 cm circumferential mid/lower rectal mass, corresponding to the patient's newly diagnosed rectal cancer. Rectal adenocarcinoma T stage: T3c Rectal  adenocarcinoma N stage:  N1 Distance from tumor to the internal anal sphincter is 3.7 cm. Electronically Signed   By: Julian Hy M.D.   On: 12/11/2020 07:28   CT CHEST ABDOMEN PELVIS W CONTRAST  Result Date: 12/07/2020 CLINICAL DATA:  Rectal mass identified by colonoscopy, new diagnosis rectal cancer, staging EXAM: CT CHEST, ABDOMEN, AND PELVIS WITH CONTRAST TECHNIQUE: Multidetector CT imaging of the chest, abdomen and pelvis was performed following the standard protocol during bolus administration of intravenous contrast. CONTRAST:  43mL OMNIPAQUE IOHEXOL 300 MG/ML SOLN, additional oral enteric contrast COMPARISON:  None. FINDINGS: CT CHEST FINDINGS Cardiovascular: No significant vascular findings. Cardiomegaly. No pericardial effusion. Mediastinum/Nodes: No enlarged mediastinal, hilar, or axillary lymph nodes. Thyroid gland, trachea, and esophagus demonstrate no significant findings. Lungs/Pleura: There is a 4 mm nonspecific pulmonary nodule of the superior segment left lower lobe (series 4, image 72). No pleural effusion or pneumothorax. Musculoskeletal: No chest wall mass or suspicious bone lesions identified. CT ABDOMEN PELVIS FINDINGS Hepatobiliary: No solid liver abnormality is seen. No gallstones, gallbladder wall thickening, or biliary dilatation. Pancreas: Unremarkable. No pancreatic ductal dilatation or surrounding inflammatory changes. Spleen: Normal in size without significant abnormality. Adrenals/Urinary Tract: Adrenal glands are unremarkable. Kidneys are normal, without renal calculi, solid lesion, or hydronephrosis. Bladder is unremarkable. Stomach/Bowel: Stomach is within normal limits. Large incidental diverticulum of the descending duodenum. Appendix appears normal. There is partially circumferential soft tissue thickening of the mid to superior rectum, approximately 5 cm in length and the inferior extent approximately 6 cm above the anal verge (series 6, image 66). Vascular/Lymphatic:  Aortic atherosclerosis. There appear to be abnormally enlarged perirectal lymph nodes posteriorly about the superior rectum measuring up to 1.0 x 0.8 cm (series 2, image 91). No other enlarged abdominal or pelvic lymph nodes. Reproductive: No mass or other abnormality. Other: No abdominal wall hernia or abnormality. No abdominopelvic ascites. Musculoskeletal: No acute or significant osseous findings. IMPRESSION: 1. There is partially circumferential soft tissue thickening of the mid to superior rectum, approximately 5 cm in length and the inferior extent approximately 6 cm above the anal verge, consistent with primary rectal malignancy identified by colonoscopy. 2. There appear to be abnormally enlarged perirectal lymph nodes posteriorly about the superior rectum measuring up to 1.0 x 0.8 cm. Findings are suspicious for perirectal nodal metastatic disease, however rectal MRI is the test of choice for initial local staging of rectal cancer. 3. There is a 4 mm nonspecific pulmonary nodule of the superior segment left lower lobe, statistically most likely incidental,  infectious or inflammatory, although nonspecific and isolated metastatic disease is not strictly excluded. Attention on follow-up. 4. No other evidence of metastatic disease in the chest, abdomen, or pelvis. Aortic Atherosclerosis (ICD10-I70.0). Electronically Signed   By: Eddie Candle M.D.   On: 12/07/2020 15:19       IMPRESSION/PLAN: 1. Stage IIIB, cT3N1 adenocarcinoma of the rectum. Dr. Lisbeth Renshaw discusses the pathology findings and reviews the nature of rectal carcinoma. He discusses that she will likely receive total neoadjuvant chemotherapy followed by chemoRT and surgical resection. We discussed the risks, benefits, short, and long term effects of radiotherapy, as well as the curative intent, and the patient is interested in proceeding at the appropriate time. Dr. Lisbeth Renshaw discusses the delivery and logistics of radiotherapy and anticipates a course  of 5 1/2 weeks of radiotherapy. We will follow up with her in 3 1/2 months to review plans for chemoRT. She will see Dr. Burr Medico tomorrow to review IV chemotherapy plans. The GI navigator is also trying to coordinate with colorectal surgery for her to meet in their office.  2. Adenocarcinoma of the right colon in a polyp. Her case will be discussed in GI conference as well prior, but it may have been fully resected at the time of biopsy.   In a visit lasting 60 minutes, greater than 50% of the time was spent face to face discussing the patient's condition, in preparation for the discussion, and coordinating the patient's care.    The above documentation reflects my direct findings during this shared patient visit. Please see the separate note by Dr. Lisbeth Renshaw on this date for the remainder of the patient's plan of care.    Carola Rhine, [facility]   **Disclaimer: This note was dictated with voice recognition software. Similar sounding words can inadvertently be transcribed and this note may contain transcription errors which may not have been corrected upon publication of note.**

## 2020-12-22 ENCOUNTER — Inpatient Hospital Stay: Payer: Self-pay | Attending: Nurse Practitioner | Admitting: Nurse Practitioner

## 2020-12-22 ENCOUNTER — Other Ambulatory Visit: Payer: Self-pay

## 2020-12-22 ENCOUNTER — Telehealth: Payer: Self-pay | Admitting: Radiation Oncology

## 2020-12-22 ENCOUNTER — Inpatient Hospital Stay: Payer: Self-pay

## 2020-12-22 ENCOUNTER — Encounter: Payer: Self-pay | Admitting: Nurse Practitioner

## 2020-12-22 VITALS — BP 126/70 | HR 63 | Temp 97.5°F | Resp 17 | Wt 117.8 lb

## 2020-12-22 DIAGNOSIS — C20 Malignant neoplasm of rectum: Secondary | ICD-10-CM

## 2020-12-22 DIAGNOSIS — K5900 Constipation, unspecified: Secondary | ICD-10-CM | POA: Insufficient documentation

## 2020-12-22 DIAGNOSIS — R634 Abnormal weight loss: Secondary | ICD-10-CM | POA: Insufficient documentation

## 2020-12-22 DIAGNOSIS — Z79899 Other long term (current) drug therapy: Secondary | ICD-10-CM | POA: Insufficient documentation

## 2020-12-22 DIAGNOSIS — R911 Solitary pulmonary nodule: Secondary | ICD-10-CM | POA: Insufficient documentation

## 2020-12-22 DIAGNOSIS — C19 Malignant neoplasm of rectosigmoid junction: Secondary | ICD-10-CM | POA: Insufficient documentation

## 2020-12-22 LAB — CBC WITH DIFFERENTIAL (CANCER CENTER ONLY)
Abs Immature Granulocytes: 0.01 10*3/uL (ref 0.00–0.07)
Basophils Absolute: 0.1 10*3/uL (ref 0.0–0.1)
Basophils Relative: 1 %
Eosinophils Absolute: 0.3 10*3/uL (ref 0.0–0.5)
Eosinophils Relative: 5 %
HCT: 37.9 % (ref 36.0–46.0)
Hemoglobin: 12.5 g/dL (ref 12.0–15.0)
Immature Granulocytes: 0 %
Lymphocytes Relative: 21 %
Lymphs Abs: 1.4 10*3/uL (ref 0.7–4.0)
MCH: 25.6 pg — ABNORMAL LOW (ref 26.0–34.0)
MCHC: 33 g/dL (ref 30.0–36.0)
MCV: 77.5 fL — ABNORMAL LOW (ref 80.0–100.0)
Monocytes Absolute: 0.5 10*3/uL (ref 0.1–1.0)
Monocytes Relative: 8 %
Neutro Abs: 4.3 10*3/uL (ref 1.7–7.7)
Neutrophils Relative %: 65 %
Platelet Count: 221 10*3/uL (ref 150–400)
RBC: 4.89 MIL/uL (ref 3.87–5.11)
RDW: 14.7 % (ref 11.5–15.5)
WBC Count: 6.6 10*3/uL (ref 4.0–10.5)
nRBC: 0 % (ref 0.0–0.2)

## 2020-12-22 LAB — CMP (CANCER CENTER ONLY)
ALT: 6 U/L (ref 0–44)
AST: 15 U/L (ref 15–41)
Albumin: 3.5 g/dL (ref 3.5–5.0)
Alkaline Phosphatase: 54 U/L (ref 38–126)
Anion gap: 9 (ref 5–15)
BUN: 14 mg/dL (ref 8–23)
CO2: 27 mmol/L (ref 22–32)
Calcium: 9 mg/dL (ref 8.9–10.3)
Chloride: 103 mmol/L (ref 98–111)
Creatinine: 0.7 mg/dL (ref 0.44–1.00)
GFR, Estimated: >60 mL/min (ref >60)
Glucose, Bld: 97 mg/dL (ref 70–99)
Potassium: 3.8 mmol/L (ref 3.5–5.1)
Sodium: 139 mmol/L (ref 135–145)
Total Bilirubin: 0.3 mg/dL (ref 0.3–1.2)
Total Protein: 7 g/dL (ref 6.5–8.1)

## 2020-12-22 LAB — CEA (IN HOUSE-CHCC): CEA (CHCC-In House): 19.95 ng/mL — ABNORMAL HIGH (ref 0.00–5.00)

## 2020-12-22 LAB — IRON AND TIBC
Iron: 42 ug/dL (ref 41–142)
Saturation Ratios: 14 % — ABNORMAL LOW (ref 21–57)
TIBC: 295 ug/dL (ref 236–444)
UIBC: 253 ug/dL (ref 120–384)

## 2020-12-22 LAB — FERRITIN: Ferritin: 172 ng/mL (ref 11–307)

## 2020-12-22 MED ORDER — CAPECITABINE 500 MG PO TABS
ORAL_TABLET | ORAL | 1 refills | Status: DC
  Filled 2020-12-22: qty 75, fill #0
  Filled 2021-01-03: qty 25, 5d supply, fill #0
  Filled 2021-01-10: qty 45, 7d supply, fill #1

## 2020-12-22 NOTE — Telephone Encounter (Signed)
I received a message that the patient met with Dr. Burr Medico and is not willing to take IV chemotherapy. Rather is is willing to take chemoRT with oral xeloda. I called her with the help of pacific interpretors but they had to leave a voicemail. I'll reserve her time on Friday 12/24/20 and keep trying to connect with her so we could start treatment on 01/03/21.

## 2020-12-23 ENCOUNTER — Encounter: Payer: Self-pay | Admitting: General Practice

## 2020-12-23 ENCOUNTER — Other Ambulatory Visit (HOSPITAL_COMMUNITY): Payer: Self-pay

## 2020-12-23 ENCOUNTER — Telehealth: Payer: Self-pay | Admitting: Pharmacist

## 2020-12-23 NOTE — Progress Notes (Signed)
[place] CSW Progress Notes  Referral received from Cira Rue PA to address issues related to finances, transportation and [employer].  Reviewed chart.  Patient is already enrolled in and using [facility] [employer], confirmed there are no issues related to transportation access [employer] .  Notes that [facility] is working on getting needed authorization for services under [employer].  Noted that patient is working w Jake Michaelis, Congregational Nurse [phone]).   Messaged this RN to discuss patient needs/resources.  Will continue to follow patient.  Edwyna Shell, LCSW Clinical Social Worker Phone:  [phone]

## 2020-12-23 NOTE — Telephone Encounter (Signed)
Oral Oncology Pharmacist Encounter  Received new prescription for Xeloda (capecitabine) for the treatment of rectal cancer in conjunction with radiation, planned for duration of radiation.  Prescription dose and frequency assessed for appropriateness. Appropriate for therapy initiation.   CMP and CBC w/ Diff from 12/22/20 assessed, no baseline dose adjustments required.  Current medication list in Epic reviewed, no relevant/significant DDIs with Xeloda identified.  Evaluated chart and no patient barriers to medication adherence noted.   Patient agreement for treatment documented in MD note on 12/22/20.  Obtained patient's signature on [employer] application to try and obtained Xeloda for patient at no cost through manufacturer assistance.   Oral Oncology Clinic will continue to follow for manufacturer assistance status, initial counseling and start date.  Leron Croak, PharmD, BCPS Hematology/Oncology Clinical Pharmacist [facility] [phone] 12/23/2020 10:39 AM

## 2020-12-23 NOTE — Progress Notes (Signed)
Met patient along with her interpreter Jillian Lutz at her initial medical oncology consult with Jillian Rue NP & Dr. [name].  Patient speaks no [place].  She did ask through her interpreter that he be called with her appointments.  I have put this the Care Coordination note in her chart. I explained my role as nurse navigator.  Patient verbalized an understanding.    I have contacted [facility] [name]) regarding her appointment with a surgeon.  She has responded saying that a message has been sent back to the referring physician the need for it to be sent to [name] so they can approve her her to be seen as she has an [employer].

## 2020-12-23 NOTE — Telephone Encounter (Signed)
Oral Oncology Pharmacist Encounter  Met with patient to complete application for [employer] Patient Assistance Program in an effort to reduce the patient's out of pocket expense for Xeloda (capecitabine) to $0.  Application completed and faxed to: [place] patient assistance phone number for follow up is: [phone]  This encounter will be updated until final determination.   Leron Croak, PharmD, BCPS Hematology/Oncology Clinical Pharmacist [facility] [phone] 12/23/2020 2:24 PM

## 2020-12-24 ENCOUNTER — Ambulatory Visit
Admission: RE | Admit: 2020-12-24 | Discharge: 2020-12-24 | Disposition: A | Discharge status: Home / Self Care | Payer: Self-pay | Source: Ambulatory Visit | Attending: Radiation Oncology | Admitting: Radiation Oncology

## 2020-12-24 ENCOUNTER — Other Ambulatory Visit: Payer: Self-pay

## 2020-12-24 ENCOUNTER — Encounter: Payer: Self-pay | Admitting: General Practice

## 2020-12-24 DIAGNOSIS — Z510 Encounter for antineoplastic radiation therapy: Secondary | ICD-10-CM | POA: Insufficient documentation

## 2020-12-24 DIAGNOSIS — C20 Malignant neoplasm of rectum: Secondary | ICD-10-CM | POA: Insufficient documentation

## 2020-12-24 NOTE — Progress Notes (Signed)
[place] CSW Progress Notes  Spoke w Jake Michaelis [phone]), [facility]ational [facility], who is familiar with patient.  Came w husband from [place] approx 8 - 10 years ago on visa.  Says she is not currently eligible for Social Security retirement benefits as she has not worked sufficient hours in order to qualify.  She is the sole support for the family.  Lives w husband who is older than she is and has multiple health issues.  Her children are all in [place]  Is connected with church locally - pastor is engaged.  She is very involved w [place].    Currently working in [employer], shift begins at 2 PM.  She would like to continue to work throughout treatment if possible as she is the sole source of income.  Congregational Nurse may help patient explore whether she has any short term disability or paid leave available to her.  Patient does have current Daiva Huge, PCP is [facility].  They will be responsible for coordinating any referrals needed through this system.  Patient currently uses [facility] [employer] - Congregational Nurse has been scheduling rides and coordinating pickups as patient does not read [place] texts.  She does request that [place] help w this scheduling issue/need, especially w calling ride when patient is ready post radiation treatment.  CSW will explore options w [facility] [employer] scheduling.    Patient does not currently have [employer], but is working with an Engineer, petroleum for past several years in order to resolve this issue.  At present, she is not eligible for either Medicaid, Medicare or other government financial support.  CSW explained the process of applying for [employer] which may help pay some living expenses while she is in treatment at [place] alerted and asked to reach out to patient re this option.    CSW Team will continue to follow this patient due to multiple psychosocial issues, however  issues raised in current referral (transportation, [employer] and financial support) have been addressed as best as possible at this point.  Edwyna Shell, LCSW Clinical Social Worker Phone:  [phone]

## 2020-12-27 NOTE — Telephone Encounter (Signed)
Oral Oncology Pharmacist Encounter  Patient approved to receive Xeloda (capecitabine) at no cost through [facility].   Approval dates: 12/27/20 - until  Patient ID: [id number]  [employer] patient assistance phone number for follow up is: [phone]  [facility] uses [facility] specialty pharmacy.   Leron Croak, PharmD, BCPS Hematology/Oncology Clinical Pharmacist [facility] [phone] 12/27/2020 12:17 PM

## 2020-12-27 NOTE — Congregational Nurse Program (Signed)
Home visit to help patient with medical bills and mail received from [facility].  She also asked that I contact her supervisor [place] at [facility] regarding her health issues and upcoming treatments. Message for supervisor.  Patient is very worried about financial issues since her job is the only income for her and her husband. Told her that there are possible resources to help with rent, utilities and food.  States she has had very little pain in the past few days.  Has been using an herbal tea from [place] and took 2 Tylenol yesterday which good results.  Jake Michaelis RN, Congregational Nurse  [phone]

## 2020-12-27 NOTE — Telephone Encounter (Signed)
Oral Chemotherapy Pharmacist Encounter  I spoke with Jillian Lutz for overview of: Xeloda for the treatment of rectal cancer in conjunction with radiation, planned duration 5 1/2 - 6 weeks.   Counseled on administration, dosing, side effects, monitoring, drug-food interactions, safe handling, storage, and disposal.  Patient will take Xeloda 500mg  tablets, 2 tablets (1000mg ) by mouth in AM and 3 tabs (1500mg ) by mouth in PM, within 30 minutes of finishing meals, on days of radiation only.  Xeloda and radiation start date: 01/04/21  Adverse effects of Xeloda include but are not limited to: fatigue, decreased blood counts, GI upset, diarrhea, mouth sores, and hand-foot syndrome.  Patient will obtain anti diarrheal and alert the office of 4 or more loose stools above baseline.   Reviewed importance of keeping a medication schedule and plan for any missed doses. No barriers to medication adherence identified.  Medication reconciliation performed and medication/allergy list updated.  Patient has been approved for manufacturer assistance through [place]. Phone number provided for Jillian Lutz to reach out and set up medication shipment [phone])  All questions answered.  Jillian Lutz voiced understanding and appreciation - he will call with Jillian Lutz if any questions arise regarding medication.   Medication education handout placed in mail for patient. Patient and family know to call the office with questions or concerns. Oral Chemotherapy Clinic phone number provided.   Leron Croak, PharmD, BCPS Hematology/Oncology Clinical Pharmacist [facility] [phone] 12/27/2020 1:57 PM

## 2020-12-28 ENCOUNTER — Telehealth: Payer: Self-pay

## 2020-12-28 NOTE — Progress Notes (Signed)
Spoke with Jake Michaelis (Congregational nurse) regarding needing help with this patient's referral to [facility] for surgical consult. As I understand because she has the orange card the referral has to be from [facility].  She verified this to be true.  [name] has a contact person there and she is going to reach out to them regarding the approval/referral to [facility].

## 2020-12-28 NOTE — Telephone Encounter (Signed)
Phone call to [facility] after receiving call from Valda Favia at [facility].  She stated there was a problem with scheduling a surgical appointment with [facility] because the patient has an [employer] and they had not received the necessary approval.  Spoke with Sande Rives at [facility] who stated she had talked with [place] and patient's PCP [facility] and that she is sending the approval today.  She is Armed forces training and education officer as contact to schedule appointment.  Jake Michaelis RN, Congregational Nurse  [phone]

## 2020-12-29 NOTE — Progress Notes (Signed)
Received message from Jake Michaelis that she spoke with [facility] and referral was being sent to [facility] yesterday for surgical consult.

## 2020-12-29 NOTE — Congregational Nurse Program (Signed)
Home visit with interpreter [name].  Reviewed treatment schedule with patient and told her transportation will be provided through [facility].  Advised her than [facility] has approved referral to [facility].  She also has been approved for free oral chemo through the drug manufacturer.  We also told her that resources have been promised through an area agency to pay her rent for July and that other funds may be available from [place].  Picked up FMLA paperwork from her and delivered it to the [place].  Patient experiencing a lot of anxiety regarding treatment and her ability to perform ADL's. She is intent upon getting her son in [place] an emergency visa to come and care for her.  She requested CN asked doctor to send request to [place] [employer].  Please contact Rev. Y'Hin Prestage for guidance in getting son to [place].  Jake Michaelis RN, Congregational Nurse [phone]

## 2020-12-30 ENCOUNTER — Telehealth: Payer: Self-pay

## 2020-12-30 ENCOUNTER — Telehealth: Payer: Self-pay | Admitting: Hematology

## 2020-12-30 ENCOUNTER — Telehealth: Payer: Self-pay | Admitting: *Deleted

## 2020-12-30 NOTE — Telephone Encounter (Signed)
FMLA received per registation includes note on behalf of Jillian Lutz by Jake Michaelis, RN, Congregational Nurse. Request "emergent letter to [place] [employer] with medical information stating she needs her son to take care of her during this illness.  Pastor/Interpreter can help:  Y'Hin Zagami [phone]) or e-mail [email or web address]. Son's information:  [name]     D.O.B.: 05/14/1984  [place], [place], [place]."   Copy of letter to provider folder for collaborative.   Jillian Lutz FMLA in queue effective today to process per forms staff.

## 2020-12-30 NOTE — Telephone Encounter (Signed)
Sch per 6/16 sch msg, left message

## 2020-12-31 ENCOUNTER — Other Ambulatory Visit: Payer: Self-pay | Admitting: Hematology

## 2020-12-31 ENCOUNTER — Encounter: Payer: Self-pay | Admitting: General Practice

## 2020-12-31 ENCOUNTER — Telehealth: Payer: Self-pay

## 2020-12-31 NOTE — Telephone Encounter (Signed)
error 

## 2020-12-31 NOTE — Progress Notes (Signed)
Jillian Lutz   [phone] [phone]) [phone]   Clinic Follow up Note   Patient Care Team: Gaylan Gerold, DO as PCP - General (Internal Medicine) Jonnie Finner, RN as Oncology Nurse Navigator Alla Feeling, NP as Nurse Practitioner (Nurse Practitioner) [name], MD as Consulting Physician (Hematology and Oncology) Kyung Rudd, MD as Consulting Physician (Radiation Oncology)  Date of Service:  01/03/2021  CHIEF COMPLAINT: F/u of rectal cancer   SUMMARY OF ONCOLOGIC HISTORY: Oncology History Overview Note  Cancer Staging Rectal adenocarcinoma [facility]) Staging form: Colon and Rectum, AJCC 8th Edition - Clinical stage from 12/21/2020: Stage IIIB (cT3, cN1, cM0) - Unsigned    Rectal adenocarcinoma ([place])  08/26/2020 Miscellaneous   Initial presentation to PCP, reporting intermittent BRBPR since 02/2020    12/01/2020 Procedure   Colonoscopy by Dr. Silverio Decamp findings - The perianal and digital rectal examinations were normal. - A 15 mm polyp was found in the ascending colon. The polyp was semi-pedunculated. Resection and retrieval were complete. - A 25 mm polyp was found in the sigmoid colon. The polyp was pedunculated. Resection and retrieval were complete. - An infiltrative partially obstructing large mass was found in the proximal rectum. The mass was partially circumferential (involving one-half of the lumen circumference). The mass measured eight cm in length extending from 10 to 18cm from anal verge. This was biopsied with a cold forceps for histology. Proximal and distal opposite fold area of the mass lesion was tattooed with an injection of total 3 mL of Spot (carbon black).   12/01/2020 Initial Biopsy   Diagnosis 1. Colon, polyp(s), ascending x 1 - ADENOCARCINOMA ARISING IN A TUBULAR ADENOMA WITH HIGH-GRADE DYSPLASIA. SEE NOTE 2. Colon, sigmoid polyp, x1 - TUBULOVILLOUS ADENOMA(S) - NEGATIVE FOR HIGH-GRADE DYSPLASIA OR MALIGNANCY 3. Rectum, biopsy -  ADENOCARCINOMA. SEE NOTE   12/01/2020 Cancer Staging   Cancer Staging Rectal adenocarcinoma [facility]) Staging form: Colon and Rectum, AJCC 8th Edition  - Clinical stage from 12/21/2020: Stage IIIB (cT3, cN1, cM0) - Unsigned      12/06/2020 Imaging   CT CAP with contrast IMPRESSION: 1. There is partially circumferential soft tissue thickening of the mid to superior rectum, approximately 5 cm in length and the inferior extent approximately 6 cm above the anal verge, consistent with primary rectal malignancy identified by colonoscopy. 2. There appear to be abnormally enlarged perirectal lymph nodes posteriorly about the superior rectum measuring up to 1.0 x 0.8 cm. Findings are suspicious for perirectal nodal metastatic disease, however rectal MRI is the test of choice for initial local staging of rectal cancer. 3. There is a 4 mm nonspecific pulmonary nodule of the superior segment left lower lobe, statistically most likely incidental, infectious or inflammatory, although nonspecific and isolated metastatic disease is not strictly excluded. Attention on follow-up. 4. No other evidence of metastatic disease in the chest, abdomen, or pelvis. Aortic Atherosclerosis (ICD10-I70.0).   12/09/2020 Imaging   Local staging MRI pelvis without contrast IMPRESSION: 4.9 cm circumferential mid/lower rectal mass, corresponding to the patient's newly diagnosed rectal cancer. Rectal adenocarcinoma T stage: T3c Rectal adenocarcinoma N stage:  N1 Distance from tumor to the internal anal sphincter is 3.7 cm.   12/21/2020 Initial Diagnosis   Rectal adenocarcinoma ([place])   01/04/2021 -  Chemotherapy   Concurrent chemoradiation with Xeloda 1000mg  in the AM and 1500mg  in the PM on days of Radiation starting 01/04/21.       01/04/2021 -  Radiation Therapy   Concurrent chemoradiation with Dr Lisbeth Renshaw  and Xeloda starting 01/04/21.       CURRENT THERAPY:  Neoadjuvant Concurrent chemoradiation with Dr Lisbeth Renshaw and  Xeloda 1000mg  in the AM and 1500mg  in the PM on days of Radiation starting 01/04/21.   INTERVAL HISTORY:  Jillian Lutz is here for a follow up of rectal cancer. She was last seen by our clinic 12/22/20. She presents to the clinic with congregational nurse and interpretor. She has not picked up her Xeloda as it was to be mailed. She has not yet received it. She notes an episode of blood in her stool. She will also have cramps with BM.  The congressional nurse opted to help pay for her Xeloda medication until her free medication come in. She will coordinate with her interpretor to be supportive point of contacts for her.    REVIEW OF SYSTEMS:   Constitutional: Denies fevers, chills or abnormal weight loss Eyes: Denies blurriness of vision Ears, nose, mouth, throat, and face: Denies mucositis or sore throat Respiratory: Denies cough, dyspnea or wheezes Cardiovascular: Denies palpitation, chest discomfort or lower extremity swelling Gastrointestinal:  Denies nausea, heartburn (+) Blood in stool (+) Abdominal cramps with BM  Skin: Denies abnormal skin rashes Lymphatics: Denies new lymphadenopathy or easy bruising Neurological:Denies numbness, tingling or new weaknesses Behavioral/Psych: Mood is stable, no new changes  All other systems were reviewed with the patient and are negative.  MEDICAL HISTORY:  Past Medical History:  Diagnosis Date   Bilateral wrist pain 10/25/2017   Diabetes mellitus without complication ([place])    Hypertension    Neck mass 1998   Unknown biopsy results. Excised in [place].   Trigger finger, acquired 02/08/2017    SURGICAL HISTORY: Past Surgical History:  Procedure Laterality Date   NECK SURGERY Left 1998   Excision of mass in [place]    I have reviewed the social history and family history with the patient and they are unchanged from previous note.  ALLERGIES:  has No Known Allergies.  MEDICATIONS:  Current Outpatient Medications  Medication Sig Dispense  Refill   prochlorperazine (COMPAZINE) 10 MG tablet Take 1 tablet (10 mg total) by mouth every 6 (six) hours as needed for nausea or vomiting. 30 tablet 2   amLODipine (NORVASC) 5 MG tablet TAKE 1 TABLET (5 MG TOTAL) BY MOUTH DAILY. 90 tablet 3   capecitabine (XELODA) 500 MG tablet Take 2 tabs in morning and 3 tabs in evening on days of radiation, Monday through Friday 150 tablet 0   gabapentin (NEURONTIN) 300 MG capsule Take 2 capsules (600 mg total) by mouth at bedtime. (Patient not taking: No sig reported) 60 capsule 2   lisinopril-hydrochlorothiazide (ZESTORETIC) 20-12.5 MG tablet TAKE 2 TABLETS BY MOUTH DAILY. 60 tablet 3   metFORMIN (GLUCOPHAGE-XR) 500 MG 24 hr tablet TAKE 1 TABLET (500 MG TOTAL) BY MOUTH DAILY WITH BREAKFAST. 30 tablet 5   No current facility-administered medications for this visit.    PHYSICAL EXAMINATION: ECOG PERFORMANCE STATUS: 1 - Symptomatic but completely ambulatory  Vitals:   01/03/21 1051  BP: 121/83  Pulse: 64  Resp: 18  Temp: 97.9 F (36.6 C)  SpO2: 100%   Filed Weights   01/03/21 1051  Weight: 118 lb 4.8 oz (53.7 kg)    Due to COVID19 we will limit examination to appearance. Patient had no complaints. GENERAL:alert, no distress and comfortable SKIN: skin color normal, no rashes or significant lesions EYES: normal, Conjunctiva are pink and non-injected, sclera clear  NEURO: alert & oriented x 3 with fluent  speech    LABORATORY DATA:  I have reviewed the data as listed CBC Latest Ref Rng & Units 01/03/2021 12/22/2020 08/26/2020  WBC 4.0 - 10.5 K/uL 6.5 6.6 6.3  Hemoglobin 12.0 - 15.0 g/dL 12.2 12.5 11.4  Hematocrit 36.0 - 46.0 % 37.9 37.9 36.4  Platelets 150 - 400 K/uL 227 221 247     CMP Latest Ref Rng & Units 01/03/2021 12/22/2020 12/01/2020  Glucose 70 - 99 mg/dL 115(H) 97 -  BUN 8 - 23 mg/dL 18 14 9   Creatinine 0.44 - 1.00 mg/dL 0.77 0.70 0.65  Sodium 135 - 145 mmol/L 140 139 -  Potassium 3.5 - 5.1 mmol/L 3.7 3.8 -  Chloride 98 - 111  mmol/L 105 103 -  CO2 22 - 32 mmol/L 29 27 -  Calcium 8.9 - 10.3 mg/dL 9.0 9.0 -  Total Protein 6.5 - 8.1 g/dL 7.1 7.0 -  Total Bilirubin 0.3 - 1.2 mg/dL 0.6 0.3 -  Alkaline Phos 38 - 126 U/L 50 54 -  AST 15 - 41 U/L 20 15 -  ALT 0 - 44 U/L 10 6 -      RADIOGRAPHIC STUDIES: I have personally reviewed the radiological images as listed and agreed with the findings in the report. No results found.   ASSESSMENT & PLAN:  Jillian Lutz is a 70 y.o. female with   1.  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 Ascending colon polyp and rectal biopsies both showed adenocarcinoma. Per the report, the ascending colon biopsy was completely retrieved and resected. -Staging work-up shows perirectal lymph node involvement and a single nonspecific left pulmonary nodule.  She has at least locally advanced disease, which is curable with multimodal approach -Given the local nodal metastasis and synchronous colon cancer in a polyp arising from tubular adenoma, she is at high risk for recurrence and new colon cancer.   -Although I strongly recommend total neoadjuvant therapy with 4 months chemo and concurrent chemoradiation, patient repeatedly declined intravenous chemo.  She agreed with oral Xeloda.  She is scheduled to start chemo and Xeloda tomorrow. -Although she was approved for free replacement for Xeloda, she has issues getting her medicine shipped in time. After speaking with pharmacist Wells Guiles, we can get her $115 for the full treatment from [facility] or pay 1 week at a time while she waits on her shipment. Congressional nurse offered to help pay until she receives her free drug. Plan to start tomorrow still.  I reviewed in detail with patient and interpretor how to take Xeloda.  -F/u in 2 weeks    2. Rectal pain and bleeding, constipation, weight loss -secondary to #1 -CBC shows no anemia, iron studies are normal -She has mild blood in stool and notes cramps with BM. Will monitor on CCCRT.  -We discussed symptom management for constipation, I recommend to start colace and miralax 1-2 x daily.   3. Social support -She is here with her husband, moved from [place] in 2014.  Her children remain in [place], spanning ages 72-50 -She does not have a driver's license, she was referred to social work for transportation and financial support -I reviewed her children's risk for CRC and the need to begin screening by at least age  27.  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  She plans to prepare certain documents. I provided her with letter for her son to come from [place].  -She has orange card. She was approved for free drug replacement for Xeloda. She would be able to get IV Chemo free as well but not interested in IV Chemo.  -She has Jillian Lutz that help with legal matters. She has help from Automotive engineer. Her interpretor is her first point of contact and congressional nurse her second.   4.  HTN, DM, pre-existing neuropathy -On lisinopril, amlodipine, metformin, and gabapentin.  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 Still plan to start tomorrow  if we can get it. -Lab in 1 week  -Lab and f/u in 2 weeks.    No problem-specific Assessment & Plan notes found for this encounter.   No orders of the defined types were placed in this encounter.  All questions were answered. The patient knows to call the clinic with any problems, questions or concerns. No barriers to learning was detected. The total time spent in the appointment was 30 minutes.     [name], MD 01/03/2021   I, Joslyn Devon, am acting as scribe for [name], MD.   I have reviewed the above documentation for accuracy and completeness, and I agree with the above.

## 2020-12-31 NOTE — Progress Notes (Unsigned)
[facility] CSW Progress Notes  Call and staff message from Jake Michaelis [phone]) [facility] Congregational Nurse working w patient and family.  Requested help w getting letter requesting emergency compassionate visa for son to come from [place] to help patient during treatment.  Patient is very anxious about her ability to manage her physical needs during upcoming treatments.  Per notes, this has already been requested by R Idelle Leech - requested update from RN.  Also concerned about transportation arrangements for patient to [facility] appts.  Had [employer] about the need on Friday 6/10.  Requested update today as follow up.  Spoke w Mo, she states that [facility] should send in requests for rides.  Will ask

## 2020-12-31 NOTE — Telephone Encounter (Signed)
-----   Message from Alla Feeling, NP sent at 12/29/2020  2:16 PM EDT ----- Iron studies are adequate, no need for IV iron, no anemia. CMP is normal. Baseline CEA is elevated, will monitor on treatment. If difficult to relay via phone will discuss next week at f/up.  Thanks, Regan Rakers NP

## 2020-12-31 NOTE — Telephone Encounter (Signed)
This nurse reached out to patient, no answer.  Unable to leave a message.  Will attempt again.

## 2021-01-03 ENCOUNTER — Encounter: Payer: Self-pay | Admitting: Hematology

## 2021-01-03 ENCOUNTER — Inpatient Hospital Stay: Payer: Self-pay

## 2021-01-03 ENCOUNTER — Other Ambulatory Visit (HOSPITAL_COMMUNITY): Payer: Self-pay

## 2021-01-03 ENCOUNTER — Other Ambulatory Visit: Payer: Self-pay

## 2021-01-03 ENCOUNTER — Encounter: Payer: Self-pay | Admitting: *Deleted

## 2021-01-03 ENCOUNTER — Inpatient Hospital Stay (HOSPITAL_BASED_OUTPATIENT_CLINIC_OR_DEPARTMENT_OTHER): Payer: Self-pay | Admitting: Hematology

## 2021-01-03 VITALS — BP 121/83 | HR 64 | Temp 97.9°F | Resp 18 | Ht <= 58 in | Wt 118.3 lb

## 2021-01-03 DIAGNOSIS — C20 Malignant neoplasm of rectum: Secondary | ICD-10-CM

## 2021-01-03 LAB — CMP (CANCER CENTER ONLY)
ALT: 10 U/L (ref 0–44)
AST: 20 U/L (ref 15–41)
Albumin: 3.8 g/dL (ref 3.5–5.0)
Alkaline Phosphatase: 50 U/L (ref 38–126)
Anion gap: 6 (ref 5–15)
BUN: 18 mg/dL (ref 8–23)
CO2: 29 mmol/L (ref 22–32)
Calcium: 9 mg/dL (ref 8.9–10.3)
Chloride: 105 mmol/L (ref 98–111)
Creatinine: 0.77 mg/dL (ref 0.44–1.00)
GFR, Estimated: >60 mL/min (ref >60)
Glucose, Bld: 115 mg/dL — ABNORMAL HIGH (ref 70–99)
Potassium: 3.7 mmol/L (ref 3.5–5.1)
Sodium: 140 mmol/L (ref 135–145)
Total Bilirubin: 0.6 mg/dL (ref 0.3–1.2)
Total Protein: 7.1 g/dL (ref 6.5–8.1)

## 2021-01-03 LAB — CBC WITH DIFFERENTIAL (CANCER CENTER ONLY)
Abs Immature Granulocytes: 0.02 10*3/uL (ref 0.00–0.07)
Basophils Absolute: 0.1 10*3/uL (ref 0.0–0.1)
Basophils Relative: 2 %
Eosinophils Absolute: 0.3 10*3/uL (ref 0.0–0.5)
Eosinophils Relative: 4 %
HCT: 37.9 % (ref 36.0–46.0)
Hemoglobin: 12.2 g/dL (ref 12.0–15.0)
Immature Granulocytes: 0 %
Lymphocytes Relative: 23 %
Lymphs Abs: 1.5 10*3/uL (ref 0.7–4.0)
MCH: 25.5 pg — ABNORMAL LOW (ref 26.0–34.0)
MCHC: 32.2 g/dL (ref 30.0–36.0)
MCV: 79.1 fL — ABNORMAL LOW (ref 80.0–100.0)
Monocytes Absolute: 0.5 10*3/uL (ref 0.1–1.0)
Monocytes Relative: 8 %
Neutro Abs: 4.1 10*3/uL (ref 1.7–7.7)
Neutrophils Relative %: 63 %
Platelet Count: 227 10*3/uL (ref 150–400)
RBC: 4.79 MIL/uL (ref 3.87–5.11)
RDW: 14.6 % (ref 11.5–15.5)
WBC Count: 6.5 10*3/uL (ref 4.0–10.5)
nRBC: 0 % (ref 0.0–0.2)

## 2021-01-03 MED ORDER — CAPECITABINE 500 MG PO TABS: ORAL_TABLET | ORAL | 0 refills | Status: DC

## 2021-01-03 MED ORDER — PROCHLORPERAZINE MALEATE 10 MG PO TABS
10.0000 mg | ORAL_TABLET | Freq: Four times a day (QID) | ORAL | 2 refills | Status: DC | PRN
  Filled 2021-01-03: qty 30, 8d supply, fill #0
  Filled 2021-01-14: qty 30, 8d supply, fill #1

## 2021-01-03 NOTE — Progress Notes (Signed)
Jillian Lutz  Clinical Social Lutz met with patient, Architect during chemo education.  CSW reviewed transportation plan with patient and Advertising account planner.  Congregational RN has scheduled all of patients rides to [facility] through [place] transportation.  CSW confirmed that [facility] could call return rides for patient after her appointments at [facility].  Patient will be going directly to [place] after treatment on the following dates. 6/22, 6/23, 6/27, and 6/28.  Congregational RN plans to confirm [place] address as the return address on the dates above.  Patient and interpreter are aware they will need to request [facility] staff to confirm return address is her [place] address when calling transportation on dates above.  Interpreter who is also involved with support in the community, stated they will also have a card/paper with dates and return address listed for patient to bring with her to appointments.  Congregational RN, patient and interpreter all expressed understanding of [facility] transportation.  No other needs expressed at this time.  CSW encouraged patient to call/contact with questions or concerns.      [name], MSW, LCSW, OSW-C Clinical Social Worker [facility] [phone]

## 2021-01-04 ENCOUNTER — Other Ambulatory Visit (HOSPITAL_COMMUNITY): Payer: Self-pay

## 2021-01-04 ENCOUNTER — Other Ambulatory Visit: Payer: Self-pay

## 2021-01-04 ENCOUNTER — Ambulatory Visit
Admission: RE | Admit: 2021-01-04 | Discharge: 2021-01-04 | Disposition: A | Discharge status: Home / Self Care | Payer: Self-pay | Source: Ambulatory Visit | Attending: Radiation Oncology | Admitting: Radiation Oncology

## 2021-01-05 ENCOUNTER — Other Ambulatory Visit (HOSPITAL_COMMUNITY): Payer: Self-pay

## 2021-01-05 ENCOUNTER — Ambulatory Visit
Admission: RE | Admit: 2021-01-05 | Discharge: 2021-01-05 | Disposition: A | Discharge status: Home / Self Care | Payer: Self-pay | Source: Ambulatory Visit | Attending: Radiation Oncology | Admitting: Radiation Oncology

## 2021-01-06 ENCOUNTER — Ambulatory Visit
Admission: RE | Admit: 2021-01-06 | Discharge: 2021-01-06 | Disposition: A | Discharge status: Home / Self Care | Payer: Self-pay | Source: Ambulatory Visit | Attending: Radiation Oncology | Admitting: Radiation Oncology

## 2021-01-06 ENCOUNTER — Other Ambulatory Visit: Payer: Self-pay

## 2021-01-07 ENCOUNTER — Telehealth: Payer: Self-pay

## 2021-01-07 ENCOUNTER — Ambulatory Visit
Admission: RE | Admit: 2021-01-07 | Discharge: 2021-01-07 | Disposition: A | Discharge status: Home / Self Care | Payer: Self-pay | Source: Ambulatory Visit | Attending: Radiation Oncology | Admitting: Radiation Oncology

## 2021-01-07 DIAGNOSIS — C20 Malignant neoplasm of rectum: Secondary | ICD-10-CM

## 2021-01-07 MED ORDER — SONAFINE EX EMUL
1.0000 "application " | Freq: Two times a day (BID) | CUTANEOUS | Status: DC
  Administered 2021-01-07: 1 via TOPICAL

## 2021-01-07 NOTE — Telephone Encounter (Signed)
Tawny Hopping called stating that the Xeloda can not be shipped to Jillian Lutz home address.  He is requesting it be shipped to [facility] for them to pick up.  Wells Guiles in oral chemotherapy department notified

## 2021-01-10 ENCOUNTER — Ambulatory Visit
Admission: RE | Admit: 2021-01-10 | Discharge: 2021-01-10 | Disposition: A | Discharge status: Home / Self Care | Payer: Self-pay | Source: Ambulatory Visit | Attending: Radiation Oncology | Admitting: Radiation Oncology

## 2021-01-10 ENCOUNTER — Other Ambulatory Visit: Payer: Self-pay

## 2021-01-10 ENCOUNTER — Inpatient Hospital Stay: Payer: Self-pay

## 2021-01-10 ENCOUNTER — Other Ambulatory Visit (HOSPITAL_COMMUNITY): Payer: Self-pay

## 2021-01-10 DIAGNOSIS — C20 Malignant neoplasm of rectum: Secondary | ICD-10-CM

## 2021-01-10 LAB — CMP (CANCER CENTER ONLY)
ALT: <6 U/L (ref 0–44)
AST: 12 U/L — ABNORMAL LOW (ref 15–41)
Albumin: 3.3 g/dL — ABNORMAL LOW (ref 3.5–5.0)
Alkaline Phosphatase: 46 U/L (ref 38–126)
Anion gap: 7 (ref 5–15)
BUN: 11 mg/dL (ref 8–23)
CO2: 29 mmol/L (ref 22–32)
Calcium: 8.6 mg/dL — ABNORMAL LOW (ref 8.9–10.3)
Chloride: 103 mmol/L (ref 98–111)
Creatinine: 0.78 mg/dL (ref 0.44–1.00)
GFR, Estimated: >60 mL/min (ref >60)
Glucose, Bld: 200 mg/dL — ABNORMAL HIGH (ref 70–99)
Potassium: 3.6 mmol/L (ref 3.5–5.1)
Sodium: 139 mmol/L (ref 135–145)
Total Bilirubin: 0.4 mg/dL (ref 0.3–1.2)
Total Protein: 6.6 g/dL (ref 6.5–8.1)

## 2021-01-10 LAB — CBC WITH DIFFERENTIAL (CANCER CENTER ONLY)
Abs Immature Granulocytes: 0.02 10*3/uL (ref 0.00–0.07)
Basophils Absolute: 0 10*3/uL (ref 0.0–0.1)
Basophils Relative: 1 %
Eosinophils Absolute: 0.2 10*3/uL (ref 0.0–0.5)
Eosinophils Relative: 4 %
HCT: 33.6 % — ABNORMAL LOW (ref 36.0–46.0)
Hemoglobin: 10.9 g/dL — ABNORMAL LOW (ref 12.0–15.0)
Immature Granulocytes: 0 %
Lymphocytes Relative: 23 %
Lymphs Abs: 1.1 10*3/uL (ref 0.7–4.0)
MCH: 25.8 pg — ABNORMAL LOW (ref 26.0–34.0)
MCHC: 32.4 g/dL (ref 30.0–36.0)
MCV: 79.6 fL — ABNORMAL LOW (ref 80.0–100.0)
Monocytes Absolute: 0.3 10*3/uL (ref 0.1–1.0)
Monocytes Relative: 6 %
Neutro Abs: 3.3 10*3/uL (ref 1.7–7.7)
Neutrophils Relative %: 66 %
Platelet Count: 197 10*3/uL (ref 150–400)
RBC: 4.22 MIL/uL (ref 3.87–5.11)
RDW: 14.5 % (ref 11.5–15.5)
WBC Count: 4.9 10*3/uL (ref 4.0–10.5)
nRBC: 0 % (ref 0.0–0.2)

## 2021-01-10 NOTE — Congregational Nurse Program (Signed)
Accompanied patient to appointment with Dr. Marcello Moores at [facility]. CN called Y'Hin Poynor who interpreted by phone. Dr. Marcello Moores thoroughly explained surgical procedures she would need for her rectal cancer.  Patient declined having surgery stating she only wanted chemo and radiation and would place her trust in God beyond those treatments.  Dr. Marcello Moores told her to please return if she changed her mind about surgery.  Jake Michaelis RN, Congregational Nurse [phone]

## 2021-01-11 ENCOUNTER — Ambulatory Visit
Admission: RE | Admit: 2021-01-11 | Discharge: 2021-01-11 | Disposition: A | Discharge status: Home / Self Care | Payer: Self-pay | Source: Ambulatory Visit | Attending: Radiation Oncology | Admitting: Radiation Oncology

## 2021-01-11 ENCOUNTER — Other Ambulatory Visit: Payer: Self-pay

## 2021-01-12 ENCOUNTER — Other Ambulatory Visit: Payer: Self-pay

## 2021-01-12 ENCOUNTER — Telehealth: Payer: Self-pay

## 2021-01-12 ENCOUNTER — Ambulatory Visit
Admission: RE | Admit: 2021-01-12 | Discharge: 2021-01-12 | Disposition: A | Discharge status: Home / Self Care | Payer: Self-pay | Source: Ambulatory Visit | Attending: Radiation Oncology | Admitting: Radiation Oncology

## 2021-01-12 NOTE — Telephone Encounter (Signed)
-----   Message from Alla Feeling, NP sent at 01/11/2021  1:22 PM EDT ----- Please call congregational nurse to interpret, mild anemia and elevated BG otherwise stable. Be sure she is taking metformin and continue Xeloda same dose 2 tabs AM/ 3 tabs PM on Mon - Friday with radiation.  Thanks, Regan Rakers, NP

## 2021-01-12 NOTE — Telephone Encounter (Signed)
I contacted Jake Michaelis RN, congregational nurse to inform her of Ms. Parma's lab results per Cira Rue, NP. [name] confirmed that she would ensure the lab results were communicated to the patient.

## 2021-01-13 ENCOUNTER — Other Ambulatory Visit: Payer: Self-pay | Admitting: *Deleted

## 2021-01-13 ENCOUNTER — Telehealth: Payer: Self-pay

## 2021-01-13 ENCOUNTER — Other Ambulatory Visit (HOSPITAL_COMMUNITY): Payer: Self-pay

## 2021-01-13 ENCOUNTER — Ambulatory Visit
Admission: RE | Admit: 2021-01-13 | Discharge: 2021-01-13 | Disposition: A | Discharge status: Home / Self Care | Payer: Self-pay | Source: Ambulatory Visit | Attending: Radiation Oncology | Admitting: Radiation Oncology

## 2021-01-13 DIAGNOSIS — R7303 Prediabetes: Secondary | ICD-10-CM

## 2021-01-13 DIAGNOSIS — I10 Essential (primary) hypertension: Secondary | ICD-10-CM

## 2021-01-13 MED ORDER — AMLODIPINE BESYLATE 5 MG PO TABS
5.0000 mg | ORAL_TABLET | Freq: Every day | ORAL | 3 refills | Status: DC
Start: 2021-01-13 — End: 2021-10-26
  Filled 2021-03-22: qty 30, 30d supply, fill #0
  Filled 2021-04-27: qty 30, 30d supply, fill #1
  Filled 2021-05-20: qty 30, 30d supply, fill #2
  Filled 2021-06-15: qty 30, 30d supply, fill #3
  Filled 2021-07-20: qty 30, 30d supply, fill #4
  Filled 2021-08-24: qty 30, 30d supply, fill #5
  Filled 2021-09-22: qty 30, 30d supply, fill #6

## 2021-01-13 MED ORDER — LISINOPRIL-HYDROCHLOROTHIAZIDE 20-12.5 MG PO TABS
2.0000 | ORAL_TABLET | Freq: Every day | ORAL | 3 refills | Status: DC
  Filled 2021-02-23: qty 60, 30d supply, fill #0
  Filled 2021-03-22: qty 60, 30d supply, fill #1
  Filled 2021-04-27: qty 60, 30d supply, fill #2
  Filled 2021-05-20: qty 60, 30d supply, fill #3

## 2021-01-13 MED ORDER — METFORMIN HCL ER 500 MG PO TB24
500.0000 mg | ORAL_TABLET | Freq: Every day | ORAL | 5 refills | Status: DC
Start: 2021-01-13 — End: 2021-09-22
  Filled 2021-03-22: qty 30, 30d supply, fill #0
  Filled 2021-04-27: qty 30, 30d supply, fill #1
  Filled 2021-05-20: qty 30, 30d supply, fill #2
  Filled 2021-06-15: qty 30, 30d supply, fill #3
  Filled 2021-07-20: qty 30, 30d supply, fill #4
  Filled 2021-08-24: qty 30, 30d supply, fill #5

## 2021-01-13 MED FILL — Lisinopril & Hydrochlorothiazide Tab 20-12.5 MG: ORAL | 30 days supply | Qty: 60 | Fill #2 | Status: AC

## 2021-01-13 MED FILL — Amlodipine Besylate Tab 5 MG (Base Equivalent): ORAL | 30 days supply | Qty: 30 | Fill #2 | Status: AC

## 2021-01-13 MED FILL — Metformin HCl Tab ER 24HR 500 MG: ORAL | 30 days supply | Qty: 30 | Fill #2 | Status: AC

## 2021-01-13 NOTE — Telephone Encounter (Signed)
I contacted Jake Michaelis RN, congregational nurse to inform her that Ms. Space's FMLA paperwork had been sent to requested employer. [name] confirmed that she would ensure the paperwork was picked up and instructions communicated to the patient.

## 2021-01-14 ENCOUNTER — Other Ambulatory Visit (HOSPITAL_COMMUNITY): Payer: Self-pay

## 2021-01-14 ENCOUNTER — Telehealth: Payer: Self-pay | Admitting: Nurse Practitioner

## 2021-01-14 ENCOUNTER — Other Ambulatory Visit: Payer: Self-pay

## 2021-01-14 ENCOUNTER — Ambulatory Visit
Admission: RE | Admit: 2021-01-14 | Discharge: 2021-01-14 | Disposition: A | Discharge status: Home / Self Care | Payer: Self-pay | Source: Ambulatory Visit | Attending: Radiation Oncology | Admitting: Radiation Oncology

## 2021-01-14 DIAGNOSIS — Z510 Encounter for antineoplastic radiation therapy: Secondary | ICD-10-CM | POA: Insufficient documentation

## 2021-01-14 DIAGNOSIS — C20 Malignant neoplasm of rectum: Secondary | ICD-10-CM | POA: Insufficient documentation

## 2021-01-14 NOTE — Congregational Nurse Program (Signed)
Home visit to deliver medications which were picked up from [facility] and Ensure and Pepcid AC from [facility]. Also picked up anti-nausea medication from [facility].  Accompanied patient to appointment at [facility] foe radiation treatment, appointment with Dr. Tammi Klippel and financial advisor.  Jake Michaelis RN, Congregational Nurse [phone]

## 2021-01-14 NOTE — Telephone Encounter (Signed)
Per 7/1 sch msg, no onsite interpreter will be available for pt's appt on 7/5. I called Jillian Lutz, who is listed as a contact for pt and she said she will come to the appt with the pt and have an interpreter call in during the appt.

## 2021-01-17 NOTE — Progress Notes (Signed)
Jillian Lutz   [phone] [phone]   Clinic Follow up Note   Patient Care Team: Gaylan Gerold, DO as PCP - General (Internal Medicine) Jonnie Finner, RN (Inactive) as Oncology Nurse Navigator Alla Feeling, NP as Nurse Practitioner (Nurse Practitioner) [name], MD as Consulting Physician (Hematology and Oncology) Kyung Rudd, MD as Consulting Physician (Radiation Oncology) 01/18/2021  CHIEF COMPLAINT: Follow up rectal cancer   SUMMARY OF ONCOLOGIC HISTORY: Oncology History Overview Note  Cancer Staging Rectal adenocarcinoma [facility]) Staging form: Colon and Rectum, AJCC 8th Edition - Clinical stage from 12/21/2020: Stage IIIB (cT3, cN1, cM0) - Unsigned    Rectal adenocarcinoma ([place])  08/26/2020 Miscellaneous   Initial presentation to PCP, reporting intermittent BRBPR since 02/2020    12/01/2020 Procedure   Colonoscopy by Dr. Silverio Decamp findings - The perianal and digital rectal examinations were normal. - A 15 mm polyp was found in the ascending colon. The polyp was semi-pedunculated. Resection and retrieval were complete. - A 25 mm polyp was found in the sigmoid colon. The polyp was pedunculated. Resection and retrieval were complete. - An infiltrative partially obstructing large mass was found in the proximal rectum. The mass was partially circumferential (involving one-half of the lumen circumference). The mass measured eight cm in length extending from 10 to 18cm from anal verge. This was biopsied with a cold forceps for histology. Proximal and distal opposite fold area of the mass lesion was tattooed with an injection of total 3 mL of Spot (carbon black).   12/01/2020 Initial Biopsy   Diagnosis 1. Colon, polyp(s), ascending x 1 - ADENOCARCINOMA ARISING IN A TUBULAR ADENOMA WITH HIGH-GRADE DYSPLASIA. SEE NOTE 2. Colon, sigmoid polyp, x1 - TUBULOVILLOUS ADENOMA(S) - NEGATIVE FOR HIGH-GRADE DYSPLASIA OR MALIGNANCY 3. Rectum, biopsy -  ADENOCARCINOMA. SEE NOTE   12/01/2020 Cancer Staging   Cancer Staging Rectal adenocarcinoma [facility]) Staging form: Colon and Rectum, AJCC 8th Edition  - Clinical stage from 12/21/2020: Stage IIIB (cT3, cN1, cM0) - Unsigned      12/06/2020 Imaging   CT CAP with contrast IMPRESSION: 1. There is partially circumferential soft tissue thickening of the mid to superior rectum, approximately 5 cm in length and the inferior extent approximately 6 cm above the anal verge, consistent with primary rectal malignancy identified by colonoscopy. 2. There appear to be abnormally enlarged perirectal lymph nodes posteriorly about the superior rectum measuring up to 1.0 x 0.8 cm. Findings are suspicious for perirectal nodal metastatic disease, however rectal MRI is the test of choice for initial local staging of rectal cancer. 3. There is a 4 mm nonspecific pulmonary nodule of the superior segment left lower lobe, statistically most likely incidental, infectious or inflammatory, although nonspecific and isolated metastatic disease is not strictly excluded. Attention on follow-up. 4. No other evidence of metastatic disease in the chest, abdomen, or pelvis. Aortic Atherosclerosis (ICD10-I70.0).   12/09/2020 Imaging   Local staging MRI pelvis without contrast IMPRESSION: 4.9 cm circumferential mid/lower rectal mass, corresponding to the patient's newly diagnosed rectal cancer. Rectal adenocarcinoma T stage: T3c Rectal adenocarcinoma N stage:  N1 Distance from tumor to the internal anal sphincter is 3.7 cm.   12/21/2020 Initial Diagnosis   Rectal adenocarcinoma ([place])   01/04/2021 -  Chemotherapy   Concurrent chemoradiation with Xeloda 1078m in the AM and 15042min the PM on days of Radiation starting 01/04/21.       01/04/2021 -  Radiation Therapy   Concurrent chemoradiation with Dr MoLisbeth Renshawnd Xeloda starting 01/04/21.  CURRENT THERAPY: ccRT with Xeloda starting 01/04/21  INTERVAL HISTORY: Jillian Lutz presents with congregational RN Jillian Lutz for follow up as scheduled.  Interpreter, Jillian Lutz, is present by phone.  She continues ccRT with Xeloda 2 AM/ 3 PM on Monday thru Friday.  She feels tired and weak at times, but out of bed and active at home and walks.  She has low appetite, denies mucositis.  Since last week rectal bleeding resolved but pain is "still there" periodically.  She had diarrhea for 2 days last week which resolved, and mild nausea without vomiting.  Compazine helps.  She has a "stretching" sensation on her hands and feet, no redness or pain.  She has decided she does not want surgery after consult with Dr. Marcello Moores, she has strong faith in God and does not want surgery because of her age.   Otherwise denies fever, chills, cough, chest pain, dyspnea.   MEDICAL HISTORY:  Past Medical History:  Diagnosis Date   Bilateral wrist pain 10/25/2017   Diabetes mellitus without complication ([place])    Hypertension    Neck mass 1998   Unknown biopsy results. Excised in [place].   Trigger finger, acquired 02/08/2017    SURGICAL HISTORY: Past Surgical History:  Procedure Laterality Date   NECK SURGERY Left 1998   Excision of mass in [place]    I have reviewed the social history and family history with the patient and they are unchanged from previous note.  ALLERGIES:  has No Known Allergies.  MEDICATIONS:  Current Outpatient Medications  Medication Sig Dispense Refill   mirtazapine (REMERON) 7.5 MG tablet Take 1 tablet (7.5 mg total) by mouth at bedtime. 30 tablet 1   amLODipine (NORVASC) 5 MG tablet TAKE 1 TABLET (5 MG TOTAL) BY MOUTH DAILY. 90 tablet 3   amLODipine (NORVASC) 5 MG tablet TAKE 1 TABLET (5 MG TOTAL) BY MOUTH DAILY. 90 tablet 3   capecitabine (XELODA) 500 MG tablet Take 2 tabs in morning and 3 tabs in evening on days of radiation, Monday through Friday 75 tablet 1   capecitabine (XELODA) 500 MG tablet Take 2 tabs in morning and 3 tabs in evening on days of radiation,  Monday through Friday 150 tablet 0   gabapentin (NEURONTIN) 300 MG capsule Take 2 capsules (600 mg total) by mouth at bedtime. (Patient not taking: No sig reported) 60 capsule 2   lisinopril-hydrochlorothiazide (ZESTORETIC) 20-12.5 MG tablet TAKE 2 TABLETS BY MOUTH DAILY. 60 tablet 3   lisinopril-hydrochlorothiazide (ZESTORETIC) 20-12.5 MG tablet TAKE 2 TABLETS BY MOUTH DAILY. 60 tablet 3   metFORMIN (GLUCOPHAGE-XR) 500 MG 24 hr tablet TAKE 1 TABLET (500 MG TOTAL) BY MOUTH DAILY WITH BREAKFAST. 30 tablet 5   metFORMIN (GLUCOPHAGE-XR) 500 MG 24 hr tablet TAKE 1 TABLET (500 MG TOTAL) BY MOUTH DAILY WITH BREAKFAST. 30 tablet 5   prochlorperazine (COMPAZINE) 10 MG tablet Take 1 tablet (10 mg total) by mouth every 6 (six) hours as needed for nausea or vomiting. 30 tablet 2   No current facility-administered medications for this visit.    PHYSICAL EXAMINATION: ECOG PERFORMANCE STATUS: 1 - Symptomatic but completely ambulatory  Vitals:   01/18/21 1020  BP: 138/77  Pulse: (!) 58  Resp: 16  Temp: 97.6 F (36.4 C)  SpO2: 98%   Filed Weights   01/18/21 1020  Weight: 119 lb 14.4 oz (54.4 kg)    GENERAL:alert, no distress and comfortable SKIN: no rash. Palms dry without erythema. Few cracks to thumbs  EYES: sclera clear  LUNGS: normal breathing effort HEART: no lower extremity edema NEURO: alert & oriented x 3 with fluent speech, no focal motor/sensory deficits  LABORATORY DATA:  I have reviewed the data as listed CBC Latest Ref Rng & Units 01/18/2021 01/10/2021 01/03/2021  WBC 4.0 - 10.5 K/uL 4.0 4.9 6.5  Hemoglobin 12.0 - 15.0 g/dL 11.0(L) 10.9(L) 12.2  Hematocrit 36.0 - 46.0 % 33.2(L) 33.6(L) 37.9  Platelets 150 - 400 K/uL 155 197 227     CMP Latest Ref Rng & Units 01/18/2021 01/10/2021 01/03/2021  Glucose 70 - 99 mg/dL 134(H) 200(H) 115(H)  BUN 8 - 23 mg/dL '13 11 18  ' Creatinine 0.44 - 1.00 mg/dL 0.74 0.78 0.77  Sodium 135 - 145 mmol/L 141 139 140  Potassium 3.5 - 5.1 mmol/L 3.6 3.6  3.7  Chloride 98 - 111 mmol/L 106 103 105  CO2 22 - 32 mmol/L '28 29 29  ' Calcium 8.9 - 10.3 mg/dL 8.6(L) 8.6(L) 9.0  Total Protein 6.5 - 8.1 g/dL 6.6 6.6 7.1  Total Bilirubin 0.3 - 1.2 mg/dL 0.4 0.4 0.6  Alkaline Phos 38 - 126 U/L 42 46 50  AST 15 - 41 U/L 12(L) 12(L) 20  ALT 0 - 44 U/L <6 <6 10      RADIOGRAPHIC STUDIES: I have personally reviewed the radiological images as listed and agreed with the findings in the report. No results found.   ASSESSMENT & PLAN: 70 year old [place] female   1.  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  Ascending colon polyp and rectal biopsies both showed adenocarcinoma.  Per the report, the ascending colon biopsy was completely retrieved and resected. -Staging work-up shows perirectal lymph node involvement and a single nonspecific left pulmonary nodule.  We discussed she has at least locally advanced disease, which is curable with multimodal approach. -she does not have severe bleeding or obstruction, we do not recommend upfront surgery. -Given the local nodal metastasis and synchronous colon cancer in a polyp arising from tubular adenoma, she is at high risk for recurrence and new colon cancer.   -We strongly recommended total neoadjuvant chemo with 4 months FOLFOX followed by concurrent chemoradiation, she repeatedly declined intravenous chemo but agreed to Xeloda.   -She previously consented to Xeloda and began chemo RT 01/04/2021 -Met with Dr. Marcello Moores, declined definitive surgery after ccRT   2. Rectal pain and bleeding, constipation, weight loss -secondary to #1 -She developed mild anemia after week 1 chemo RT  -Bleeding stopped on week 2   -discussed symptom management for constipation   3. Social support -She is here with her husband, moved from [place] in 2014.  Her children remain in [place], spanning ages 49-50 -I reviewed her children's risk for CRC and the need to begin screening by at least age 89.  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  She plans to prepare certain documents -She has orange card, understands that Xeloda may be very expensive, and we could  likely get intravenous chemo for free.  She still declined IV chemo and prefers Xeloda.  She met our oral chemo pharmacist Wells Guiles today to begin financial assistance process -She does not have a driver's license, she was referred to social work for transportation and financial support   4.  HTN, DM, pre-existing neuropathy -On lisinopril, amlodipine, metformin, and gabapentin.  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 S/p week 2 chemoRT with Xeloda 1000 mg AM/1500 mg PM, tolerating well with mild n/d, hand-foot syndrome, and fatigue.  Side effects are well managed with supportive care at home.  She is able to function well.  Has persistent mild rectal pain but bleeding stopped during week 2 chemo RT.  She is requesting appetite stimulant, we reviewed potential benefit and side effects of mirtazapine.  She is interested, I prescribed today.  Due to her age and faith that God will heal her, she does not plan to proceed with surgery after completing chemo RT.  I reviewed that without surgical resection, her cancer is likely not curable with chemo and radiation  alone, she understands.  She previously declined IV chemo, but may be open to continue Xeloda as a maintenance/palliative regimen.  We will discuss further after completing chemo RT.  Labs reviewed, adequate to continue chemoRT with Xeloda at same dose. Labs in 1 week, and f/up in 2 weeks. She knows to call sooner with new/worsening concerns or poorly-managed side effects.   All questions were answered. The patient knows to call the clinic with any problems, questions or concerns. No barriers to learning were detected. Total encounter was 30 minutes.      Alla Feeling, NP 01/18/21

## 2021-01-18 ENCOUNTER — Inpatient Hospital Stay (HOSPITAL_BASED_OUTPATIENT_CLINIC_OR_DEPARTMENT_OTHER): Payer: Self-pay | Admitting: Nurse Practitioner

## 2021-01-18 ENCOUNTER — Ambulatory Visit
Admission: RE | Admit: 2021-01-18 | Discharge: 2021-01-18 | Disposition: A | Discharge status: Home / Self Care | Payer: Self-pay | Source: Ambulatory Visit | Attending: Radiation Oncology | Admitting: Radiation Oncology

## 2021-01-18 ENCOUNTER — Inpatient Hospital Stay: Payer: Self-pay | Attending: Nurse Practitioner

## 2021-01-18 ENCOUNTER — Encounter: Payer: Self-pay | Admitting: Nurse Practitioner

## 2021-01-18 ENCOUNTER — Other Ambulatory Visit: Payer: Self-pay

## 2021-01-18 ENCOUNTER — Other Ambulatory Visit (HOSPITAL_COMMUNITY): Payer: Self-pay

## 2021-01-18 VITALS — BP 138/77 | HR 58 | Temp 97.6°F | Resp 16 | Ht <= 58 in | Wt 119.9 lb

## 2021-01-18 DIAGNOSIS — R300 Dysuria: Secondary | ICD-10-CM | POA: Insufficient documentation

## 2021-01-18 DIAGNOSIS — D649 Anemia, unspecified: Secondary | ICD-10-CM | POA: Insufficient documentation

## 2021-01-18 DIAGNOSIS — Z79899 Other long term (current) drug therapy: Secondary | ICD-10-CM | POA: Insufficient documentation

## 2021-01-18 DIAGNOSIS — C20 Malignant neoplasm of rectum: Secondary | ICD-10-CM

## 2021-01-18 DIAGNOSIS — C182 Malignant neoplasm of ascending colon: Secondary | ICD-10-CM | POA: Insufficient documentation

## 2021-01-18 LAB — CMP (CANCER CENTER ONLY)
ALT: <6 U/L (ref 0–44)
AST: 12 U/L — ABNORMAL LOW (ref 15–41)
Albumin: 3.4 g/dL — ABNORMAL LOW (ref 3.5–5.0)
Alkaline Phosphatase: 42 U/L (ref 38–126)
Anion gap: 7 (ref 5–15)
BUN: 13 mg/dL (ref 8–23)
CO2: 28 mmol/L (ref 22–32)
Calcium: 8.6 mg/dL — ABNORMAL LOW (ref 8.9–10.3)
Chloride: 106 mmol/L (ref 98–111)
Creatinine: 0.74 mg/dL (ref 0.44–1.00)
GFR, Estimated: >60 mL/min (ref >60)
Glucose, Bld: 134 mg/dL — ABNORMAL HIGH (ref 70–99)
Potassium: 3.6 mmol/L (ref 3.5–5.1)
Sodium: 141 mmol/L (ref 135–145)
Total Bilirubin: 0.4 mg/dL (ref 0.3–1.2)
Total Protein: 6.6 g/dL (ref 6.5–8.1)

## 2021-01-18 LAB — CBC WITH DIFFERENTIAL (CANCER CENTER ONLY)
Abs Immature Granulocytes: 0.02 10*3/uL (ref 0.00–0.07)
Basophils Absolute: 0 10*3/uL (ref 0.0–0.1)
Basophils Relative: 1 %
Eosinophils Absolute: 0.3 10*3/uL (ref 0.0–0.5)
Eosinophils Relative: 7 %
HCT: 33.2 % — ABNORMAL LOW (ref 36.0–46.0)
Hemoglobin: 11 g/dL — ABNORMAL LOW (ref 12.0–15.0)
Immature Granulocytes: 1 %
Lymphocytes Relative: 19 %
Lymphs Abs: 0.8 10*3/uL (ref 0.7–4.0)
MCH: 26.3 pg (ref 26.0–34.0)
MCHC: 33.1 g/dL (ref 30.0–36.0)
MCV: 79.2 fL — ABNORMAL LOW (ref 80.0–100.0)
Monocytes Absolute: 0.5 10*3/uL (ref 0.1–1.0)
Monocytes Relative: 11 %
Neutro Abs: 2.5 10*3/uL (ref 1.7–7.7)
Neutrophils Relative %: 61 %
Platelet Count: 155 10*3/uL (ref 150–400)
RBC: 4.19 MIL/uL (ref 3.87–5.11)
RDW: 15 % (ref 11.5–15.5)
WBC Count: 4 10*3/uL (ref 4.0–10.5)
nRBC: 0 % (ref 0.0–0.2)

## 2021-01-18 MED ORDER — MIRTAZAPINE 7.5 MG PO TABS
7.5000 mg | ORAL_TABLET | Freq: Every day | ORAL | 1 refills | Status: DC
  Filled 2021-01-18: qty 30, 30d supply, fill #0

## 2021-01-19 ENCOUNTER — Ambulatory Visit
Admission: RE | Admit: 2021-01-19 | Discharge: 2021-01-19 | Disposition: A | Discharge status: Home / Self Care | Payer: Self-pay | Source: Ambulatory Visit | Attending: Radiation Oncology | Admitting: Radiation Oncology

## 2021-01-19 ENCOUNTER — Telehealth: Payer: Self-pay

## 2021-01-19 NOTE — Telephone Encounter (Signed)
Phone call to [employer] to request financial assistance for patient.  They will e-mail link to print application.  CN will assist patient in completing and return required information.  Jake Michaelis RN, Congregational Nurse [phone]

## 2021-01-20 ENCOUNTER — Encounter: Payer: Self-pay | Admitting: Radiation Oncology

## 2021-01-20 ENCOUNTER — Ambulatory Visit
Admission: RE | Admit: 2021-01-20 | Discharge: 2021-01-20 | Disposition: A | Discharge status: Home / Self Care | Payer: Self-pay | Source: Ambulatory Visit | Attending: Radiation Oncology | Admitting: Radiation Oncology

## 2021-01-20 ENCOUNTER — Other Ambulatory Visit: Payer: Self-pay

## 2021-01-21 ENCOUNTER — Ambulatory Visit
Admission: RE | Admit: 2021-01-21 | Discharge: 2021-01-21 | Disposition: A | Discharge status: Home / Self Care | Payer: Self-pay | Source: Ambulatory Visit | Attending: Radiation Oncology | Admitting: Radiation Oncology

## 2021-01-24 ENCOUNTER — Ambulatory Visit
Admission: RE | Admit: 2021-01-24 | Discharge: 2021-01-24 | Disposition: A | Discharge status: Home / Self Care | Payer: Self-pay | Source: Ambulatory Visit | Attending: Radiation Oncology | Admitting: Radiation Oncology

## 2021-01-24 ENCOUNTER — Other Ambulatory Visit: Payer: Self-pay

## 2021-01-24 ENCOUNTER — Inpatient Hospital Stay: Payer: Self-pay

## 2021-01-24 DIAGNOSIS — C20 Malignant neoplasm of rectum: Secondary | ICD-10-CM

## 2021-01-24 LAB — CMP (CANCER CENTER ONLY)
ALT: 6 U/L (ref 0–44)
AST: 17 U/L (ref 15–41)
Albumin: 3.4 g/dL — ABNORMAL LOW (ref 3.5–5.0)
Alkaline Phosphatase: 40 U/L (ref 38–126)
Anion gap: 6 (ref 5–15)
BUN: 14 mg/dL (ref 8–23)
CO2: 27 mmol/L (ref 22–32)
Calcium: 8.8 mg/dL — ABNORMAL LOW (ref 8.9–10.3)
Chloride: 108 mmol/L (ref 98–111)
Creatinine: 0.69 mg/dL (ref 0.44–1.00)
GFR, Estimated: >60 mL/min (ref >60)
Glucose, Bld: 80 mg/dL (ref 70–99)
Potassium: 3.7 mmol/L (ref 3.5–5.1)
Sodium: 141 mmol/L (ref 135–145)
Total Bilirubin: 0.5 mg/dL (ref 0.3–1.2)
Total Protein: 6.7 g/dL (ref 6.5–8.1)

## 2021-01-24 LAB — CBC WITH DIFFERENTIAL (CANCER CENTER ONLY)
Abs Immature Granulocytes: 0.01 10*3/uL (ref 0.00–0.07)
Basophils Absolute: 0.1 10*3/uL (ref 0.0–0.1)
Basophils Relative: 2 %
Eosinophils Absolute: 0.3 10*3/uL (ref 0.0–0.5)
Eosinophils Relative: 11 %
HCT: 33.3 % — ABNORMAL LOW (ref 36.0–46.0)
Hemoglobin: 10.9 g/dL — ABNORMAL LOW (ref 12.0–15.0)
Immature Granulocytes: 0 %
Lymphocytes Relative: 16 %
Lymphs Abs: 0.5 10*3/uL — ABNORMAL LOW (ref 0.7–4.0)
MCH: 26 pg (ref 26.0–34.0)
MCHC: 32.7 g/dL (ref 30.0–36.0)
MCV: 79.3 fL — ABNORMAL LOW (ref 80.0–100.0)
Monocytes Absolute: 0.4 10*3/uL (ref 0.1–1.0)
Monocytes Relative: 14 %
Neutro Abs: 1.7 10*3/uL (ref 1.7–7.7)
Neutrophils Relative %: 57 %
Platelet Count: 160 10*3/uL (ref 150–400)
RBC: 4.2 MIL/uL (ref 3.87–5.11)
RDW: 15.4 % (ref 11.5–15.5)
WBC Count: 3 10*3/uL — ABNORMAL LOW (ref 4.0–10.5)
nRBC: 0 % (ref 0.0–0.2)

## 2021-01-25 ENCOUNTER — Ambulatory Visit
Admission: RE | Admit: 2021-01-25 | Discharge: 2021-01-25 | Disposition: A | Discharge status: Home / Self Care | Payer: Self-pay | Source: Ambulatory Visit | Attending: Radiation Oncology | Admitting: Radiation Oncology

## 2021-01-26 ENCOUNTER — Other Ambulatory Visit: Payer: Self-pay

## 2021-01-26 ENCOUNTER — Encounter: Payer: Self-pay | Admitting: Nutrition

## 2021-01-26 ENCOUNTER — Ambulatory Visit
Admission: RE | Admit: 2021-01-26 | Discharge: 2021-01-26 | Disposition: A | Discharge status: Home / Self Care | Payer: Self-pay | Source: Ambulatory Visit | Attending: Radiation Oncology | Admitting: Radiation Oncology

## 2021-01-26 NOTE — Progress Notes (Signed)
Patient did not show up for nutrition appointment. 

## 2021-01-26 NOTE — Congregational Nurse Program (Signed)
Home visit to pick up pay stubs for past 3 months to complete paperwork for financial assistance for medical bills for patient and her husband.  Jake Michaelis RN,  Congregational Nurse [phone]

## 2021-01-27 ENCOUNTER — Telehealth: Payer: Self-pay | Admitting: *Deleted

## 2021-01-27 ENCOUNTER — Other Ambulatory Visit: Payer: Self-pay

## 2021-01-27 ENCOUNTER — Ambulatory Visit
Admission: RE | Admit: 2021-01-27 | Discharge: 2021-01-27 | Disposition: A | Discharge status: Home / Self Care | Payer: Self-pay | Source: Ambulatory Visit | Attending: Radiation Oncology | Admitting: Radiation Oncology

## 2021-01-27 NOTE — Telephone Encounter (Signed)
-----   Message from Wilmon Arms, RN sent at 01/27/2021  8:38 AM EDT -----  ----- Message ----- From: Alla Feeling, NP Sent: 01/25/2021  12:49 PM EDT To: [place] [facility]  Please call congregational nurse or interpreter to let her know labs are adequate to continue Xeloda and radiation, same dose.  Thanks, Regan Rakers

## 2021-01-27 NOTE — Telephone Encounter (Signed)
Contacted Jake Michaelis RN,  Congregational Nurse [phone].  Left messages at 1040 and 1645 with [place] office w/ Joycelyn Das for Ms. O'Brien to call [place].

## 2021-01-28 ENCOUNTER — Telehealth: Payer: Self-pay

## 2021-01-28 ENCOUNTER — Ambulatory Visit
Admission: RE | Admit: 2021-01-28 | Discharge: 2021-01-28 | Disposition: A | Discharge status: Home / Self Care | Payer: Self-pay | Source: Ambulatory Visit | Attending: Radiation Oncology | Admitting: Radiation Oncology

## 2021-01-28 NOTE — Telephone Encounter (Signed)
Spoke with Archie Balboa, congregational nurse, and relayed patient's most recent lab results per Cira Rue, NP. Ms Darryl Lent verbalized understanding.

## 2021-01-31 ENCOUNTER — Ambulatory Visit
Admission: RE | Admit: 2021-01-31 | Discharge: 2021-01-31 | Disposition: A | Discharge status: Home / Self Care | Payer: Self-pay | Source: Ambulatory Visit | Attending: Radiation Oncology | Admitting: Radiation Oncology

## 2021-01-31 ENCOUNTER — Inpatient Hospital Stay: Payer: Self-pay

## 2021-01-31 ENCOUNTER — Inpatient Hospital Stay (HOSPITAL_BASED_OUTPATIENT_CLINIC_OR_DEPARTMENT_OTHER): Payer: Self-pay | Admitting: Hematology

## 2021-01-31 ENCOUNTER — Other Ambulatory Visit: Payer: Self-pay

## 2021-01-31 ENCOUNTER — Encounter: Payer: Self-pay | Admitting: Hematology

## 2021-01-31 VITALS — BP 178/79 | HR 56 | Temp 98.9°F | Resp 18 | Ht <= 58 in | Wt 118.7 lb

## 2021-01-31 DIAGNOSIS — C20 Malignant neoplasm of rectum: Secondary | ICD-10-CM

## 2021-01-31 LAB — CMP (CANCER CENTER ONLY)
ALT: <6 U/L (ref 0–44)
AST: 14 U/L — ABNORMAL LOW (ref 15–41)
Albumin: 3.6 g/dL (ref 3.5–5.0)
Alkaline Phosphatase: 44 U/L (ref 38–126)
Anion gap: 6 (ref 5–15)
BUN: 13 mg/dL (ref 8–23)
CO2: 28 mmol/L (ref 22–32)
Calcium: 8.9 mg/dL (ref 8.9–10.3)
Chloride: 107 mmol/L (ref 98–111)
Creatinine: 0.7 mg/dL (ref 0.44–1.00)
GFR, Estimated: >60 mL/min (ref >60)
Glucose, Bld: 89 mg/dL (ref 70–99)
Potassium: 4.2 mmol/L (ref 3.5–5.1)
Sodium: 141 mmol/L (ref 135–145)
Total Bilirubin: 0.3 mg/dL (ref 0.3–1.2)
Total Protein: 7 g/dL (ref 6.5–8.1)

## 2021-01-31 LAB — CBC WITH DIFFERENTIAL (CANCER CENTER ONLY)
Abs Immature Granulocytes: 0.01 10*3/uL (ref 0.00–0.07)
Basophils Absolute: 0 10*3/uL (ref 0.0–0.1)
Basophils Relative: 1 %
Eosinophils Absolute: 0.4 10*3/uL (ref 0.0–0.5)
Eosinophils Relative: 10 %
HCT: 34.3 % — ABNORMAL LOW (ref 36.0–46.0)
Hemoglobin: 11.3 g/dL — ABNORMAL LOW (ref 12.0–15.0)
Immature Granulocytes: 0 %
Lymphocytes Relative: 9 %
Lymphs Abs: 0.4 10*3/uL — ABNORMAL LOW (ref 0.7–4.0)
MCH: 26.3 pg (ref 26.0–34.0)
MCHC: 32.9 g/dL (ref 30.0–36.0)
MCV: 79.8 fL — ABNORMAL LOW (ref 80.0–100.0)
Monocytes Absolute: 0.4 10*3/uL (ref 0.1–1.0)
Monocytes Relative: 10 %
Neutro Abs: 2.7 10*3/uL (ref 1.7–7.7)
Neutrophils Relative %: 70 %
Platelet Count: 186 10*3/uL (ref 150–400)
RBC: 4.3 MIL/uL (ref 3.87–5.11)
RDW: 15.8 % — ABNORMAL HIGH (ref 11.5–15.5)
WBC Count: 3.9 10*3/uL — ABNORMAL LOW (ref 4.0–10.5)
nRBC: 0 % (ref 0.0–0.2)

## 2021-01-31 LAB — CEA (IN HOUSE-CHCC): CEA (CHCC-In House): 10.93 ng/mL — ABNORMAL HIGH (ref 0.00–5.00)

## 2021-01-31 NOTE — Progress Notes (Addendum)
[place]   [phone] Fax:([phone]   Clinic Follow up Note   Patient Care Team: Gaylan Gerold, DO as PCP - General (Internal Medicine) Jonnie Finner, RN (Inactive) as Oncology Nurse Navigator Alla Feeling, NP as Nurse Practitioner (Nurse Practitioner) [name], MD as Consulting Physician (Hematology and Oncology) Kyung Rudd, MD as Consulting Physician (Radiation Oncology)  Date of Service:  01/31/2021  CHIEF COMPLAINT: f/u of rectal cancer  SUMMARY OF ONCOLOGIC HISTORY: Oncology History Overview Note  Cancer Staging Rectal adenocarcinoma [facility]) Staging form: Colon and Rectum, AJCC 8th Edition - Clinical stage from 12/21/2020: Stage IIIB (cT3, cN1, cM0) - Unsigned    Rectal adenocarcinoma ([place])  08/26/2020 Miscellaneous   Initial presentation to PCP, reporting intermittent BRBPR since 02/2020    12/01/2020 Procedure   Colonoscopy by Dr. Silverio Decamp findings - The perianal and digital rectal examinations were normal. - A 15 mm polyp was found in the ascending colon. The polyp was semi-pedunculated. Resection and retrieval were complete. - A 25 mm polyp was found in the sigmoid colon. The polyp was pedunculated. Resection and retrieval were complete. - An infiltrative partially obstructing large mass was found in the proximal rectum. The mass was partially circumferential (involving one-half of the lumen circumference). The mass measured eight cm in length extending from 10 to 18cm from anal verge. This was biopsied with a cold forceps for histology. Proximal and distal opposite fold area of the mass lesion was tattooed with an injection of total 3 mL of Spot (carbon black).   12/01/2020 Initial Biopsy   Diagnosis 1. Colon, polyp(s), ascending x 1 - ADENOCARCINOMA ARISING IN A TUBULAR ADENOMA WITH HIGH-GRADE DYSPLASIA. SEE NOTE 2. Colon, sigmoid polyp, x1 - TUBULOVILLOUS ADENOMA(S) - NEGATIVE FOR HIGH-GRADE DYSPLASIA OR MALIGNANCY 3. Rectum,  biopsy - ADENOCARCINOMA. SEE NOTE   12/01/2020 Cancer Staging   Cancer Staging Rectal adenocarcinoma [facility]) Staging form: Colon and Rectum, AJCC 8th Edition  - Clinical stage from 12/21/2020: Stage IIIB (cT3, cN1, cM0) - Unsigned      12/06/2020 Imaging   CT CAP with contrast IMPRESSION: 1. There is partially circumferential soft tissue thickening of the mid to superior rectum, approximately 5 cm in length and the inferior extent approximately 6 cm above the anal verge, consistent with primary rectal malignancy identified by colonoscopy. 2. There appear to be abnormally enlarged perirectal lymph nodes posteriorly about the superior rectum measuring up to 1.0 x 0.8 cm. Findings are suspicious for perirectal nodal metastatic disease, however rectal MRI is the test of choice for initial local staging of rectal cancer. 3. There is a 4 mm nonspecific pulmonary nodule of the superior segment left lower lobe, statistically most likely incidental, infectious or inflammatory, although nonspecific and isolated metastatic disease is not strictly excluded. Attention on follow-up. 4. No other evidence of metastatic disease in the chest, abdomen, or pelvis. Aortic Atherosclerosis (ICD10-I70.0).   12/09/2020 Imaging   Local staging MRI pelvis without contrast IMPRESSION: 4.9 cm circumferential mid/lower rectal mass, corresponding to the patient's newly diagnosed rectal cancer. Rectal adenocarcinoma T stage: T3c Rectal adenocarcinoma N stage:  N1 Distance from tumor to the internal anal sphincter is 3.7 cm.   12/21/2020 Initial Diagnosis   Rectal adenocarcinoma ([place])   01/04/2021 -  Chemotherapy   Concurrent chemoradiation with Xeloda 1000mg  in the AM and 1500mg  in the PM on days of Radiation starting 01/04/21.       01/04/2021 -  Radiation Therapy   Concurrent chemoradiation with Dr Lisbeth Renshaw  and Xeloda starting 01/04/21.       CURRENT THERAPY:  ccRT with Xeloda starting 01/04/21  INTERVAL  HISTORY:  Jillian Lutz is here for a follow up of rectal cancer. She was last seen by me on 01/03/21 and by NP [name] on 01/18/21 in the interim. She presents to the clinic with an interpreter. She is tolerating chemoRT well, no new complains.  Her rectal pain has resolved now BM has been normal, no hemochesia She has some headaches especially in mornings  Weight is stable, appetite little low but she eats well    All other systems were reviewed with the patient and are negative.  MEDICAL HISTORY:  Past Medical History:  Diagnosis Date   Bilateral wrist pain 10/25/2017   Diabetes mellitus without complication ([place])    Hypertension    Neck mass 1998   Unknown biopsy results. Excised in [place].   Trigger finger, acquired 02/08/2017    SURGICAL HISTORY: Past Surgical History:  Procedure Laterality Date   NECK SURGERY Left 1998   Excision of mass in [place]    I have reviewed the social history and family history with the patient and they are unchanged from previous note.  ALLERGIES:  has No Known Allergies.  MEDICATIONS:  Current Outpatient Medications  Medication Sig Dispense Refill   amLODipine (NORVASC) 5 MG tablet TAKE 1 TABLET (5 MG TOTAL) BY MOUTH DAILY. 90 tablet 3   amLODipine (NORVASC) 5 MG tablet TAKE 1 TABLET (5 MG TOTAL) BY MOUTH DAILY. 90 tablet 3   capecitabine (XELODA) 500 MG tablet Take 2 tabs in morning and 3 tabs in evening on days of radiation, Monday through Friday 75 tablet 1   capecitabine (XELODA) 500 MG tablet Take 2 tabs in morning and 3 tabs in evening on days of radiation, Monday through Friday 150 tablet 0   gabapentin (NEURONTIN) 300 MG capsule Take 2 capsules (600 mg total) by mouth at bedtime. (Patient not taking: No sig reported) 60 capsule 2   lisinopril-hydrochlorothiazide (ZESTORETIC) 20-12.5 MG tablet TAKE 2 TABLETS BY MOUTH DAILY. 60 tablet 3   lisinopril-hydrochlorothiazide (ZESTORETIC) 20-12.5 MG tablet TAKE 2 TABLETS BY MOUTH DAILY. 60 tablet 3    metFORMIN (GLUCOPHAGE-XR) 500 MG 24 hr tablet TAKE 1 TABLET (500 MG TOTAL) BY MOUTH DAILY WITH BREAKFAST. 30 tablet 5   metFORMIN (GLUCOPHAGE-XR) 500 MG 24 hr tablet TAKE 1 TABLET (500 MG TOTAL) BY MOUTH DAILY WITH BREAKFAST. 30 tablet 5   mirtazapine (REMERON) 7.5 MG tablet Take 1 tablet (7.5 mg total) by mouth at bedtime. 30 tablet 1   prochlorperazine (COMPAZINE) 10 MG tablet Take 1 tablet (10 mg total) by mouth every 6 (six) hours as needed for nausea or vomiting. 30 tablet 2   No current facility-administered medications for this visit.    PHYSICAL EXAMINATION: ECOG PERFORMANCE STATUS: 0 - Asymptomatic  Vitals:   01/31/21 1240  BP: (!) 178/79  Pulse: (!) 56  Resp: 18  Temp: 98.9 F (37.2 C)  SpO2: 100%   Filed Weights   01/31/21 1240  Weight: 118 lb 11.2 oz (53.8 kg)    GENERAL:alert, no distress and comfortable SKIN: skin color, texture, turgor are normal, no rashes or significant lesions, (+) dry skin at palms, no peeling or erythema  EYES: normal, Conjunctiva are pink and non-injected, sclera clear Musculoskeletal:no cyanosis of digits and no clubbing  NEURO: alert & oriented x 3 with fluent speech, no focal motor/sensory deficits  LABORATORY DATA:  I have reviewed the data  as listed CBC Latest Ref Rng & Units 01/31/2021 01/24/2021 01/18/2021  WBC 4.0 - 10.5 K/uL 3.9(L) 3.0(L) 4.0  Hemoglobin 12.0 - 15.0 g/dL 11.3(L) 10.9(L) 11.0(L)  Hematocrit 36.0 - 46.0 % 34.3(L) 33.3(L) 33.2(L)  Platelets 150 - 400 K/uL 186 160 155     CMP Latest Ref Rng & Units 01/24/2021 01/18/2021 01/10/2021  Glucose 70 - 99 mg/dL 80 134(H) 200(H)  BUN 8 - 23 mg/dL 14 13 11   Creatinine 0.44 - 1.00 mg/dL 0.69 0.74 0.78  Sodium 135 - 145 mmol/L 141 141 139  Potassium 3.5 - 5.1 mmol/L 3.7 3.6 3.6  Chloride 98 - 111 mmol/L 108 106 103  CO2 22 - 32 mmol/L 27 28 29   Calcium 8.9 - 10.3 mg/dL 8.8(L) 8.6(L) 8.6(L)  Total Protein 6.5 - 8.1 g/dL 6.7 6.6 6.6  Total Bilirubin 0.3 - 1.2 mg/dL 0.5 0.4  0.4  Alkaline Phos 38 - 126 U/L 40 42 46  AST 15 - 41 U/L 17 12(L) 12(L)  ALT 0 - 44 U/L 6 <6 <6      RADIOGRAPHIC STUDIES: I have personally reviewed the radiological images as listed and agreed with the findings in the report. No results found.   ASSESSMENT & PLAN:  Kingslee Dowse is a 69 y.o. female with   1.  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 Ascending colon polyp and rectal biopsies both showed adenocarcinoma. Per the report, the ascending colon biopsy was completely retrieved and resected. -Staging work-up shows perirectal lymph node involvement and a single nonspecific left pulmonary nodule.  She has at least locally advanced disease, which is curable with multimodal approach -Given the local nodal metastasis and synchronous colon cancer in a polyp arising from tubular adenoma, she is at high risk for recurrence and new colon cancer.  The recommendation was for total neoadjuvant therapy with 4 months chemo and concurrent chemoradiation, patient repeatedly declined intravenous chemo.  She agreed with oral Xeloda.   -she is tolerating chemRT well, will continue  -lab reviewed  -f/u next week (last week of chemoRT)  -I encourage her to consider surgery after CCRT, but she declined again -I also again encouraged her to consider iv chemo such as CAPOX and she declined again    2. Rectal pain and bleeding, constipation, weight loss -secondary to #1 -mild anemia on Xeloda.  -bleeding resolved after week 2. -rectal pain now resolved    3. Social support -She is here with her husband, moved from [place] in 2014.  Her children remain in [place], spanning ages 60-50 -She does  not have a driver's license, she was previously referred to social work for transportation and financial support -I reviewed her children's risk for CRC and the need to begin screening by at least age 60.  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  She plans to prepare certain documents. I provided her with letter for her son to come from [place].  -She has  orange card. She was approved for free drug replacement for Xeloda. She would be able to get IV Chemo free as well but not interested in IV Chemo. -She has Anice Paganini that help with legal matters. She has help from Automotive engineer. Her interpretor is her first point of contact and congressional nurse her second.    4.  HTN, DM, pre-existing neuropathy -On lisinopril, amlodipine, metformin, and gabapentin.  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   No orders of the defined types were placed in this encounter.  All questions were answered. The patient knows to call the clinic with any problems, questions or concerns. No barriers to learning was detected. The total time spent in the appointment was 30 minutes.     [name], MD 01/31/2021   I, Wilburn Mylar, am acting as scribe for [name], MD.   I have reviewed the above documentation for accuracy and completeness, and I agree with the above.

## 2021-02-01 ENCOUNTER — Ambulatory Visit
Admission: RE | Admit: 2021-02-01 | Discharge: 2021-02-01 | Disposition: A | Discharge status: Home / Self Care | Payer: Self-pay | Source: Ambulatory Visit | Attending: Radiation Oncology | Admitting: Radiation Oncology

## 2021-02-02 ENCOUNTER — Telehealth: Payer: Self-pay

## 2021-02-02 ENCOUNTER — Other Ambulatory Visit: Payer: Self-pay

## 2021-02-02 ENCOUNTER — Ambulatory Visit
Admission: RE | Admit: 2021-02-02 | Discharge: 2021-02-02 | Disposition: A | Discharge status: Home / Self Care | Payer: Self-pay | Source: Ambulatory Visit | Attending: Radiation Oncology | Admitting: Radiation Oncology

## 2021-02-02 NOTE — Telephone Encounter (Signed)
I contacted Jake Michaelis the congregational nurse to inform her of Ms. Smyers's tumor marker results. [name] verbalized understanding and states that she was currently with the patient's interpretor and that they would be communicating this news to the patient shortly.

## 2021-02-02 NOTE — Telephone Encounter (Signed)
-----   Message from Alla Feeling, NP sent at 02/01/2021  1:30 PM EDT ----- Please call interpreter to let pt know tumor marker is decreasing on chemoRT, she is likely responding to treatment.    Thanks, Regan Rakers, NP

## 2021-02-03 ENCOUNTER — Ambulatory Visit
Admission: RE | Admit: 2021-02-03 | Discharge: 2021-02-03 | Disposition: A | Discharge status: Home / Self Care | Payer: Self-pay | Source: Ambulatory Visit | Attending: Radiation Oncology | Admitting: Radiation Oncology

## 2021-02-04 ENCOUNTER — Ambulatory Visit
Admission: RE | Admit: 2021-02-04 | Discharge: 2021-02-04 | Disposition: A | Discharge status: Home / Self Care | Payer: Self-pay | Source: Ambulatory Visit | Attending: Radiation Oncology | Admitting: Radiation Oncology

## 2021-02-07 ENCOUNTER — Encounter: Payer: Self-pay | Admitting: Hematology

## 2021-02-07 ENCOUNTER — Ambulatory Visit
Admission: RE | Admit: 2021-02-07 | Discharge: 2021-02-07 | Disposition: A | Discharge status: Home / Self Care | Payer: Self-pay | Source: Ambulatory Visit | Attending: Radiation Oncology | Admitting: Radiation Oncology

## 2021-02-07 ENCOUNTER — Inpatient Hospital Stay (HOSPITAL_BASED_OUTPATIENT_CLINIC_OR_DEPARTMENT_OTHER): Payer: Self-pay | Admitting: Hematology

## 2021-02-07 ENCOUNTER — Other Ambulatory Visit: Payer: Self-pay

## 2021-02-07 ENCOUNTER — Inpatient Hospital Stay: Payer: Self-pay

## 2021-02-07 VITALS — BP 139/80 | HR 56 | Temp 97.8°F | Resp 18 | Wt 117.4 lb

## 2021-02-07 DIAGNOSIS — C20 Malignant neoplasm of rectum: Secondary | ICD-10-CM

## 2021-02-07 DIAGNOSIS — R300 Dysuria: Secondary | ICD-10-CM

## 2021-02-07 LAB — CMP (CANCER CENTER ONLY)
ALT: <6 U/L (ref 0–44)
AST: 15 U/L (ref 15–41)
Albumin: 3.7 g/dL (ref 3.5–5.0)
Alkaline Phosphatase: 45 U/L (ref 38–126)
Anion gap: 8 (ref 5–15)
BUN: 15 mg/dL (ref 8–23)
CO2: 26 mmol/L (ref 22–32)
Calcium: 9 mg/dL (ref 8.9–10.3)
Chloride: 107 mmol/L (ref 98–111)
Creatinine: 0.77 mg/dL (ref 0.44–1.00)
GFR, Estimated: >60 mL/min (ref >60)
Glucose, Bld: 87 mg/dL (ref 70–99)
Potassium: 3.9 mmol/L (ref 3.5–5.1)
Sodium: 141 mmol/L (ref 135–145)
Total Bilirubin: 0.7 mg/dL (ref 0.3–1.2)
Total Protein: 7 g/dL (ref 6.5–8.1)

## 2021-02-07 LAB — CBC WITH DIFFERENTIAL (CANCER CENTER ONLY)
Abs Immature Granulocytes: 0.01 10*3/uL (ref 0.00–0.07)
Basophils Absolute: 0 10*3/uL (ref 0.0–0.1)
Basophils Relative: 1 %
Eosinophils Absolute: 0.3 10*3/uL (ref 0.0–0.5)
Eosinophils Relative: 7 %
HCT: 33.8 % — ABNORMAL LOW (ref 36.0–46.0)
Hemoglobin: 11.1 g/dL — ABNORMAL LOW (ref 12.0–15.0)
Immature Granulocytes: 0 %
Lymphocytes Relative: 7 %
Lymphs Abs: 0.3 10*3/uL — ABNORMAL LOW (ref 0.7–4.0)
MCH: 26.3 pg (ref 26.0–34.0)
MCHC: 32.8 g/dL (ref 30.0–36.0)
MCV: 80.1 fL (ref 80.0–100.0)
Monocytes Absolute: 0.4 10*3/uL (ref 0.1–1.0)
Monocytes Relative: 11 %
Neutro Abs: 3 10*3/uL (ref 1.7–7.7)
Neutrophils Relative %: 74 %
Platelet Count: 182 10*3/uL (ref 150–400)
RBC: 4.22 MIL/uL (ref 3.87–5.11)
RDW: 15.4 % (ref 11.5–15.5)
WBC Count: 4 10*3/uL (ref 4.0–10.5)
nRBC: 0 % (ref 0.0–0.2)

## 2021-02-07 NOTE — Progress Notes (Addendum)
[place]   Telephone:(336) [phone] Fax:(336) [phone]   Clinic Follow up Note   Patient Care Team: Gaylan Gerold, DO as PCP - General (Internal Medicine) Jonnie Finner, RN (Inactive) as Oncology Nurse Navigator Alla Feeling, NP as Nurse Practitioner (Nurse Practitioner) [name], MD as Consulting Physician (Hematology and Oncology) Kyung Rudd, MD as Consulting Physician (Radiation Oncology)  Date of Service:  02/07/2021  CHIEF COMPLAINT: f/u of rectal cancer  SUMMARY OF ONCOLOGIC HISTORY: Oncology History Overview Note  Cancer Staging Rectal adenocarcinoma [facility]) Staging form: Colon and Rectum, AJCC 8th Edition - Clinical stage from 12/21/2020: Stage IIIB (cT3, cN1, cM0) - Unsigned    Rectal adenocarcinoma ([place])  08/26/2020 Miscellaneous   Initial presentation to PCP, reporting intermittent BRBPR since 02/2020    12/01/2020 Procedure   Colonoscopy by Dr. Silverio Decamp findings - The perianal and digital rectal examinations were normal. - A 15 mm polyp was found in the ascending colon. The polyp was semi-pedunculated. Resection and retrieval were complete. - A 25 mm polyp was found in the sigmoid colon. The polyp was pedunculated. Resection and retrieval were complete. - An infiltrative partially obstructing large mass was found in the proximal rectum. The mass was partially circumferential (involving one-half of the lumen circumference). The mass measured eight cm in length extending from 10 to 18cm from anal verge. This was biopsied with a cold forceps for histology. Proximal and distal opposite fold area of the mass lesion was tattooed with an injection of total 3 mL of Spot (carbon black).   12/01/2020 Initial Biopsy   Diagnosis 1. Colon, polyp(s), ascending x 1 - ADENOCARCINOMA ARISING IN A TUBULAR ADENOMA WITH HIGH-GRADE DYSPLASIA. SEE NOTE 2. Colon, sigmoid polyp, x1 - TUBULOVILLOUS ADENOMA(S) - NEGATIVE FOR HIGH-GRADE DYSPLASIA OR MALIGNANCY 3. Rectum,  biopsy - ADENOCARCINOMA. SEE NOTE   12/01/2020 Cancer Staging   Cancer Staging Rectal adenocarcinoma [facility]) Staging form: Colon and Rectum, AJCC 8th Edition  - Clinical stage from 12/21/2020: Stage IIIB (cT3, cN1, cM0) - Unsigned      12/06/2020 Imaging   CT CAP with contrast IMPRESSION: 1. There is partially circumferential soft tissue thickening of the mid to superior rectum, approximately 5 cm in length and the inferior extent approximately 6 cm above the anal verge, consistent with primary rectal malignancy identified by colonoscopy. 2. There appear to be abnormally enlarged perirectal lymph nodes posteriorly about the superior rectum measuring up to 1.0 x 0.8 cm. Findings are suspicious for perirectal nodal metastatic disease, however rectal MRI is the test of choice for initial local staging of rectal cancer. 3. There is a 4 mm nonspecific pulmonary nodule of the superior segment left lower lobe, statistically most likely incidental, infectious or inflammatory, although nonspecific and isolated metastatic disease is not strictly excluded. Attention on follow-up. 4. No other evidence of metastatic disease in the chest, abdomen, or pelvis. Aortic Atherosclerosis (ICD10-I70.0).   12/09/2020 Imaging   Local staging MRI pelvis without contrast IMPRESSION: 4.9 cm circumferential mid/lower rectal mass, corresponding to the patient's newly diagnosed rectal cancer. Rectal adenocarcinoma T stage: T3c Rectal adenocarcinoma N stage:  N1 Distance from tumor to the internal anal sphincter is 3.7 cm.   12/21/2020 Initial Diagnosis   Rectal adenocarcinoma ([place])   01/04/2021 -  Chemotherapy   Concurrent chemoradiation with Xeloda '1000mg'$  in the AM and '1500mg'$  in the PM on days of Radiation starting 01/04/21.       01/04/2021 - 02/11/2021 Radiation Therapy   Concurrent chemoradiation with Dr Lisbeth Renshaw  and Xeloda starting 01/04/21.       CURRENT THERAPY:  ccRT with Xeloda starting  01/04/21  INTERVAL HISTORY:  Jillian Lutz is here for a follow up of rectal cancer. She was last seen by me on 01/31/21. She presents to the clinic alone. [employer] interpreter used. She reports feeling well overall except for diminished appetite. She notes she tries to snack and supplement with Ensure. She reports a burning sensation with urination/to the urethra. She notes this has been going on intermittently for 4-5 years, but this has been constant since yesterday.   All other systems were reviewed with the patient and are negative.  MEDICAL HISTORY:  Past Medical History:  Diagnosis Date   Bilateral wrist pain 10/25/2017   Diabetes mellitus without complication ([place])    Hypertension    Neck mass 1998   Unknown biopsy results. Excised in [place].   Trigger finger, acquired 02/08/2017    SURGICAL HISTORY: Past Surgical History:  Procedure Laterality Date   NECK SURGERY Left 1998   Excision of mass in [place]    I have reviewed the social history and family history with the patient and they are unchanged from previous note.  ALLERGIES:  has No Known Allergies.  MEDICATIONS:  Current Outpatient Medications  Medication Sig Dispense Refill   amLODipine (NORVASC) 5 MG tablet TAKE 1 TABLET (5 MG TOTAL) BY MOUTH DAILY. 90 tablet 3   amLODipine (NORVASC) 5 MG tablet TAKE 1 TABLET (5 MG TOTAL) BY MOUTH DAILY. 90 tablet 3   capecitabine (XELODA) 500 MG tablet Take 2 tabs in morning and 3 tabs in evening on days of radiation, Monday through Friday 75 tablet 1   capecitabine (XELODA) 500 MG tablet Take 2 tabs in morning and 3 tabs in evening on days of radiation, Monday through Friday 150 tablet 0   gabapentin (NEURONTIN) 300 MG capsule Take 2 capsules (600 mg total) by mouth at bedtime. (Patient not taking: No sig reported) 60 capsule 2   lisinopril-hydrochlorothiazide (ZESTORETIC) 20-12.5 MG tablet TAKE 2 TABLETS BY MOUTH DAILY. 60 tablet 3   lisinopril-hydrochlorothiazide (ZESTORETIC)  20-12.5 MG tablet TAKE 2 TABLETS BY MOUTH DAILY. 60 tablet 3   metFORMIN (GLUCOPHAGE-XR) 500 MG 24 hr tablet TAKE 1 TABLET (500 MG TOTAL) BY MOUTH DAILY WITH BREAKFAST. 30 tablet 5   metFORMIN (GLUCOPHAGE-XR) 500 MG 24 hr tablet TAKE 1 TABLET (500 MG TOTAL) BY MOUTH DAILY WITH BREAKFAST. 30 tablet 5   mirtazapine (REMERON) 7.5 MG tablet Take 1 tablet (7.5 mg total) by mouth at bedtime. 30 tablet 1   prochlorperazine (COMPAZINE) 10 MG tablet Take 1 tablet (10 mg total) by mouth every 6 (six) hours as needed for nausea or vomiting. 30 tablet 2   No current facility-administered medications for this visit.    PHYSICAL EXAMINATION: ECOG PERFORMANCE STATUS: 1 - Symptomatic but completely ambulatory  Vitals:   02/07/21 1306  BP: 139/80  Pulse: (!) 56  Resp: 18  Temp: 97.8 F (36.6 C)  SpO2: 98%   Filed Weights   02/07/21 1306  Weight: 117 lb 6.4 oz (53.3 kg)    Due to COVID19 we will limit examination to appearance. Patient had no complaints.  GENERAL:alert, no distress and comfortable SKIN: skin color normal, no rashes or significant lesions EYES: normal, Conjunctiva are pink and non-injected, sclera clear  NEURO: alert & oriented x 3 with fluent speech  LABORATORY DATA:  I have reviewed the data as listed CBC Latest Ref Rng & Units 02/07/2021 01/31/2021  01/24/2021  WBC 4.0 - 10.5 K/uL 4.0 3.9(L) 3.0(L)  Hemoglobin 12.0 - 15.0 g/dL 11.1(L) 11.3(L) 10.9(L)  Hematocrit 36.0 - 46.0 % 33.8(L) 34.3(L) 33.3(L)  Platelets 150 - 400 K/uL 182 186 160     CMP Latest Ref Rng & Units 02/07/2021 01/31/2021 01/24/2021  Glucose 70 - 99 mg/dL 87 89 80  BUN 8 - 23 mg/dL '15 13 14  '$ Creatinine 0.44 - 1.00 mg/dL 0.77 0.70 0.69  Sodium 135 - 145 mmol/L 141 141 141  Potassium 3.5 - 5.1 mmol/L 3.9 4.2 3.7  Chloride 98 - 111 mmol/L 107 107 108  CO2 22 - 32 mmol/L '26 28 27  '$ Calcium 8.9 - 10.3 mg/dL 9.0 8.9 8.8(L)  Total Protein 6.5 - 8.1 g/dL 7.0 7.0 6.7  Total Bilirubin 0.3 - 1.2 mg/dL 0.7 0.3 0.5   Alkaline Phos 38 - 126 U/L 45 44 40  AST 15 - 41 U/L 15 14(L) 17  ALT 0 - 44 U/L <6 <6 6      RADIOGRAPHIC STUDIES: I have personally reviewed the radiological images as listed and agreed with the findings in the report. No results found.   ASSESSMENT & PLAN:  Jillian Lutz is a 70 y.o. female with   1.  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 Ascending colon polyp and rectal biopsies both showed adenocarcinoma. Per the report, the ascending colon biopsy was completely retrieved and resected. -Staging work-up shows perirectal lymph node involvement and a single nonspecific left pulmonary nodule.  She has at least locally advanced disease, which is curable with multimodal approach -Given the local nodal metastasis and synchronous colon cancer in a polyp arising from tubular adenoma, she is at high risk for recurrence and new colon cancer.  The recommendation was for total neoadjuvant therapy with 4 months chemo and concurrent chemoradiation, patient repeatedly declined intravenous chemo.   -she is tolerating chemRT well, will complete RT on 02/11/21. Labs reviewed, no concerns. -she has repeated declined surgery also -lab and f/u in 4-5 weeks, please to repeat CT scan 3 months after she completes chemoRT    2. Symptom Management: low appetite, dysuria -mild anemia on Xeloda, stable. Hgb 11.1 today (02/07/21) -rectal bleeding and pain resolved -she reports a burning sensation with urination and also notes her urine is dark in color. -I will order urine test to rule out UTI.   3. Social support -She is here with her husband, moved from [place] in 2014.  Her children remain in [place],  spanning ages 43-50 -She does not have a driver's license, she was previously referred to social work for transportation and financial support -I reviewed her children's risk for CRC and the need to begin screening by at least age 55.  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 I previously provided her with letter for her son to come from [place].  -She has orange card. She was approved for free drug replacement for Xeloda. She would  be able to get IV Chemo free as well but not interested in IV Chemo. -She has Anice Paganini that help with legal matters. She has help from Automotive engineer. Her interpretor is her first point of contact and congressional nurse her second.    4.  HTN, DM, pre-existing neuropathy -On lisinopril, amlodipine, metformin, and gabapentin.  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   Orders Placed This Encounter  Procedures   Urine Culture    Standing Status:   Future    Standing Expiration Date:   02/07/2022   Urinalysis, Complete w Microscopic    Standing Status:   Future    Standing Expiration Date:   02/07/2022    All questions were answered. The patient knows to call the clinic with any problems, questions or concerns. No barriers to learning was detected. The total time spent in the appointment was 30 minutes.     [name], MD 02/07/2021   I, Wilburn Mylar, am acting as scribe for [name], MD.   I have reviewed the above documentation for accuracy and completeness, and I agree with the above.

## 2021-02-08 ENCOUNTER — Inpatient Hospital Stay: Payer: Self-pay

## 2021-02-08 ENCOUNTER — Ambulatory Visit
Admission: RE | Admit: 2021-02-08 | Discharge: 2021-02-08 | Disposition: A | Discharge status: Home / Self Care | Payer: Self-pay | Source: Ambulatory Visit | Attending: Radiation Oncology | Admitting: Radiation Oncology

## 2021-02-08 DIAGNOSIS — R300 Dysuria: Secondary | ICD-10-CM

## 2021-02-08 LAB — URINALYSIS, COMPLETE (UACMP) WITH MICROSCOPIC
Bilirubin Urine: NEGATIVE
Glucose, UA: NEGATIVE mg/dL
Hgb urine dipstick: NEGATIVE
Ketones, ur: NEGATIVE mg/dL
Nitrite: POSITIVE — AB
Protein, ur: NEGATIVE mg/dL
Specific Gravity, Urine: 1.017 (ref 1.005–1.030)
pH: 6 (ref 5.0–8.0)

## 2021-02-08 NOTE — Congregational Nurse Program (Signed)
Home visit with interpreter [name] and CN Coordinator Lessie Dings.  Debbie spoke with patient regarding ways to deal with having a cancer diagnosis including taking care of herself not only physically but emotionally.  She encouraged her to rest and trust not only in God but her doctors as well. Discussed financial problems and lack of family support.  CN program has approved paying rent for August.  Patient very appreciative.  She also shared that she is waiting to hear if her son will be able to come from [place] to help her during illness. We unsuccessfully attempted to connect with [employer] during visit to follow-up on referral for help with September rent.  Patient states she has rectal pain after BM and also having pain with voiding. States rectal bleeding has stopped.  Labs and urinalysis done today.  She would like to request pain medication to use as needed.  Currently only using Tylenol. CN will accompany her to appointment with NP on 02/10/2021 to talk about pain medication.  Jake Michaelis RN, Congregational Nurse [phone]

## 2021-02-09 ENCOUNTER — Ambulatory Visit: Admission: RE | Admit: 2021-02-09 | Payer: Self-pay | Source: Ambulatory Visit

## 2021-02-10 ENCOUNTER — Other Ambulatory Visit (HOSPITAL_COMMUNITY): Payer: Self-pay

## 2021-02-10 ENCOUNTER — Other Ambulatory Visit: Payer: Self-pay | Admitting: Radiation Oncology

## 2021-02-10 ENCOUNTER — Telehealth: Payer: Self-pay

## 2021-02-10 ENCOUNTER — Ambulatory Visit: Payer: Self-pay | Admitting: Nurse Practitioner

## 2021-02-10 ENCOUNTER — Ambulatory Visit
Admission: RE | Admit: 2021-02-10 | Discharge: 2021-02-10 | Disposition: A | Discharge status: Home / Self Care | Payer: Self-pay | Source: Ambulatory Visit | Attending: Radiation Oncology | Admitting: Radiation Oncology

## 2021-02-10 ENCOUNTER — Other Ambulatory Visit: Payer: Self-pay | Admitting: Nurse Practitioner

## 2021-02-10 DIAGNOSIS — C20 Malignant neoplasm of rectum: Secondary | ICD-10-CM

## 2021-02-10 DIAGNOSIS — N3000 Acute cystitis without hematuria: Secondary | ICD-10-CM

## 2021-02-10 LAB — URINE CULTURE: Culture: 40000 — AB

## 2021-02-10 MED ORDER — CIPROFLOXACIN HCL 500 MG PO TABS
500.0000 mg | ORAL_TABLET | Freq: Two times a day (BID) | ORAL | 0 refills | Status: DC
  Filled 2021-02-10: qty 6, 3d supply, fill #0

## 2021-02-10 MED ORDER — TRAMADOL HCL 50 MG PO TABS
50.0000 mg | ORAL_TABLET | Freq: Four times a day (QID) | ORAL | 0 refills | Status: DC | PRN
  Filled 2021-02-10: qty 30, 8d supply, fill #0

## 2021-02-10 MED ORDER — PHENAZOPYRIDINE HCL 200 MG PO TABS
200.0000 mg | ORAL_TABLET | Freq: Three times a day (TID) | ORAL | 0 refills | Status: DC | PRN
  Filled 2021-02-10: qty 12, 4d supply, fill #0

## 2021-02-10 NOTE — Telephone Encounter (Signed)
This patient stopped in on her way to radiation treatment this morning.  Requested pain medication for pain in rectum with bowel movements.  Also has pain to her lower back.  Patient believes she may have a UTI and needs something for that.  This nurse advised patient that this information will be forwarded to the provider and she will be notified of recommendations.  Patient acknowledged understanding.

## 2021-02-11 ENCOUNTER — Ambulatory Visit
Admission: RE | Admit: 2021-02-11 | Discharge: 2021-02-11 | Disposition: A | Discharge status: Home / Self Care | Payer: Self-pay | Source: Ambulatory Visit | Attending: Radiation Oncology | Admitting: Radiation Oncology

## 2021-02-11 ENCOUNTER — Other Ambulatory Visit (HOSPITAL_COMMUNITY): Payer: Self-pay

## 2021-02-11 ENCOUNTER — Other Ambulatory Visit: Payer: Self-pay

## 2021-02-11 ENCOUNTER — Other Ambulatory Visit: Payer: Self-pay | Admitting: Radiation Oncology

## 2021-02-11 ENCOUNTER — Telehealth: Payer: Self-pay

## 2021-02-11 ENCOUNTER — Ambulatory Visit: Payer: Self-pay

## 2021-02-11 MED ORDER — PHENAZOPYRIDINE HCL 200 MG PO TABS
200.0000 mg | ORAL_TABLET | Freq: Three times a day (TID) | ORAL | 0 refills | Status: DC | PRN
  Filled 2021-02-11: qty 12, 4d supply, fill #0

## 2021-02-11 NOTE — Telephone Encounter (Signed)
Spoke with interpreter and he states that he has picked up the 3 prescriptions for the patient already

## 2021-02-14 ENCOUNTER — Encounter: Payer: Self-pay | Admitting: Radiation Oncology

## 2021-02-14 ENCOUNTER — Other Ambulatory Visit: Payer: Self-pay

## 2021-02-14 ENCOUNTER — Ambulatory Visit
Admission: RE | Admit: 2021-02-14 | Discharge: 2021-02-14 | Disposition: A | Discharge status: Home / Self Care | Payer: Self-pay | Source: Ambulatory Visit | Attending: Radiation Oncology | Admitting: Radiation Oncology

## 2021-02-14 DIAGNOSIS — Z510 Encounter for antineoplastic radiation therapy: Secondary | ICD-10-CM | POA: Insufficient documentation

## 2021-02-14 DIAGNOSIS — C20 Malignant neoplasm of rectum: Secondary | ICD-10-CM | POA: Insufficient documentation

## 2021-02-15 ENCOUNTER — Telehealth: Payer: Self-pay

## 2021-02-15 NOTE — Telephone Encounter (Signed)
This nurse attempted to reach patient to follow up on symptoms of UTI.  Patient is currently taking Cipro for treatment.  No answer and unable to leave a message.  Nurse will attempt to reach out to patient again.

## 2021-02-17 NOTE — Progress Notes (Signed)
  Radiation Oncology         [phone] ________________________________  Name: Jillian Lutz MRN: [id number]  Date: 01/20/2021  DOB: 04-15-1951  SIMULATION NOTE   NARRATIVE:  The patient underwent simulation today for ongoing radiation therapy.  The existing CT study set was employed for the purpose of virtual treatment planning.  The target and avoidance structures were reviewed and modified as necessary.  Treatment planning then occurred.  The radiation boost prescription was entered and confirmed.  A total of 4 complex treatment devices were fabricated in the form of multi-leaf collimators to shape radiation around the targets while maximally excluding nearby normal structures. I have requested : Isodose Plan.    PLAN:  This modified radiation beam arrangement is intended to continue the current radiation dose to an additional 5.4 Gy in 3 fractions for a total cumulative dose of 50.4 Gy.    ------------------------------------------------  Jodelle Gross, MD, PhD

## 2021-02-18 NOTE — Congregational Nurse Program (Signed)
CN and interpreter [name] accompanied patient to [facility] where she was seen by Dr. Fonnie Mu.  Patient found to have very dense cataract but eyes appear healthy otherwise.  She was prescribed glasses which can help some until cataract surgery can be arranged through [facility].  They will call when glasses arrive in about 2 weeks and also when referral is made to surgeon.  Jake Michaelis RN Congregational Nurse [phone]

## 2021-02-18 NOTE — Congregational Nurse Program (Signed)
Met with patient along with CN Jake Michaelis to discuss patients health status and decisions needing to be made. Patient identified that Jillian Lutz has been in pain, [name] will notify her doctor. Patient will receive assistance from the [place] to pay for her August 2022 rent. Patient states Jillian Lutz must work to pay the bills. Jillian Lutz works, standing up, second shift on a conveyor belt. Jillian Lutz was overjoyed knowing Jillian Lutz could take some time off of work to rest because her rent was being taken care of.

## 2021-02-21 ENCOUNTER — Telehealth: Payer: Self-pay

## 2021-02-21 NOTE — Telephone Encounter (Signed)
Called patient to remind her of appointment at [facility] clinic tomorrow at 8:45 am.  Advised that her ride should arrive at 8:15.  Patient was coughing and states she has itching all over.  Told her to be sure to let her doctor know.  Jake Michaelis RN, Congregational Nurse [phone]

## 2021-02-22 ENCOUNTER — Encounter: Payer: Self-pay | Admitting: Internal Medicine

## 2021-02-22 ENCOUNTER — Other Ambulatory Visit: Payer: Self-pay

## 2021-02-22 ENCOUNTER — Ambulatory Visit (INDEPENDENT_AMBULATORY_CARE_PROVIDER_SITE_OTHER): Payer: Self-pay | Admitting: Internal Medicine

## 2021-02-22 ENCOUNTER — Ambulatory Visit
Admission: RE | Admit: 2021-02-22 | Payer: Self-pay | Source: Ambulatory Visit | Attending: Radiation Oncology | Admitting: Radiation Oncology

## 2021-02-22 VITALS — BP 126/83 | HR 60 | Temp 98.5°F | Ht <= 58 in | Wt 119.7 lb

## 2021-02-22 DIAGNOSIS — R300 Dysuria: Secondary | ICD-10-CM

## 2021-02-22 DIAGNOSIS — I10 Essential (primary) hypertension: Secondary | ICD-10-CM

## 2021-02-22 DIAGNOSIS — R7303 Prediabetes: Secondary | ICD-10-CM

## 2021-02-22 DIAGNOSIS — Z658 Other specified problems related to psychosocial circumstances: Secondary | ICD-10-CM

## 2021-02-22 DIAGNOSIS — Z0000 Encounter for general adult medical examination without abnormal findings: Secondary | ICD-10-CM

## 2021-02-22 DIAGNOSIS — N3000 Acute cystitis without hematuria: Secondary | ICD-10-CM

## 2021-02-22 DIAGNOSIS — H538 Other visual disturbances: Secondary | ICD-10-CM

## 2021-02-22 NOTE — Assessment & Plan Note (Signed)
She reports dysuria present for the last 10 years. At her chemoradiation appointment on 7/29, UA was nitrite positive and urine culture grew 40,000 colonies of ampicillin-sulfabactam resistant E.coli. She was prescribed a 3 day course of ciprofloxacin, which resolved the dysuria. However, she reports dysuria has returned since finishing the antibiotics. She denies fever, chills, and flank pain. We will obtain a UA and cultures today. Given her age, vulvovaginal atrophy is also possible. She has never had a DEXA scan, so unclear if she has postmenopausal estrogen deficiency. She denied dryness and is not sexually active with her husband. She also just finished chemoradiation so radiation cystitis is also possible, but given the onset of her symptoms seems less likely. She declined vaginal exam today. I tried to normalize symptoms and encouraged her to consider vaginal exam at next visit. If her UA indicates UTI is present, we will treat with a 5-day course of Macrobid. If UA and cultures are negative, we will prescribe topical estrogen cream.   Plan:  -UA and culture pending  -Follow-up with her congregational nurse, Lyman Speller, according to results

## 2021-02-22 NOTE — Assessment & Plan Note (Signed)
Vitals:   02/22/21 0850  BP: 126/83   Her BP is at goal, will continue current regimen. She denies light-headedness or syncopal episodes, headaches, and peripheral edema.   Plan:  -Continue Lisinopril-HCTZ 20-12.'5mg'$  daily  -Continue Amlodipine '5mg'$  daily  -Follow-up in 3 months

## 2021-02-22 NOTE — Assessment & Plan Note (Signed)
Patient is a [place] refugee who came to the [place] in 2014 with her husband. Her husband is a retired Theme park manager and also a patient here. He is unable to work and she is the sole earner. She works at [employer], a Data processing manager, in [employer], where she works a physically demanding job on the [employer]. 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   She was diagnosed with rectal adenocarcinoma in June 2022 and is feeling increased stress. She has the orange card and is well-supported by her congregational nurse, Jake Michaelis [phone]). However, she endorses significant worry about finances since she has had to take time off and her leave is unpaid. She petitioned for her son to come from [place] and help care for her, and he has an upcoming interview with [employer] this month.   Plan:  -Referral to Social Work placed for income/employment assistance

## 2021-02-22 NOTE — Progress Notes (Deleted)
Subjective:     Patient ID: Jillian Lutz, female   DOB: 1951-03-03, 70 y.o.   MRN: [id number]  [name] is a 70 year old [place] female with HTN, pre-diabetes, and rectal adenocarcinoma s/p chemoradiation. She is accompanied by her interpreter, Daih.   She was diagnosed with rectal adenocarcinoma stage IIIB after a colonoscopy on 5/18 and subsequently began chemoradiation with concurrent oral Xeloda. She finished her treatment on 7/29, and still is having decreased appetite. At her last day of treatment, she noted long-standing dysuria, which today she says has been present for about 10 years. Her oncologist cultured her urine and it grew ampicillin resistant E. Coli, so she took a 3 day course of ciprofloxacin. She states the antibiotics helped significantly but her dysuria has returned since stopping them.   She is feeling significant stress about upcoming bills, as she is the sole supporter of herself and her husband, but has had to take time off work because of her illness. 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 She petitioned to sponsor her son to come from [place] to help care for her, and he has an upcoming interview in August with [employer].   She recently saw Dr. Genevie Ann at [facility] and was diagnosed with cataracts, but otherwise her eye exam was unremarkable. She is getting glasses next week and [facility] is coordinating her cataract surgery.   Review of Systems  Constitutional:  Positive for appetite change and fatigue. Negative for chills.  Respiratory:  Negative for cough.   Cardiovascular:  Negative for leg swelling.  Gastrointestinal:  Negative for abdominal pain and rectal pain.  Endocrine: Negative for polydipsia and polyuria.  Genitourinary:  Positive for dysuria. Negative for flank pain, frequency, hematuria and vaginal discharge.  Musculoskeletal:  Positive for back pain.   Neurological:  Negative for dizziness and headaches.  Psychiatric/Behavioral:  The patient is nervous/anxious.       Objective:   Today's Vitals   02/22/21 0850  BP: 126/83  Pulse: 60  Temp: 98.5 F (36.9 C)  TempSrc: Oral  SpO2: 99%  Weight: 119 lb 11.2 oz (54.3 kg)  Height: '4\' 10"'$  (1.473 m)   Body mass index is 25.02 kg/m.   Physical Exam Vitals reviewed.  HENT:     Nose: Nose normal.     Mouth/Throat:     Mouth: Mucous membranes are moist.     Pharynx: Oropharynx is clear.  Cardiovascular:     Rate and Rhythm: Normal rate and regular rhythm.  Pulmonary:     Effort: Pulmonary effort is normal.     Breath sounds: Normal breath sounds.  Musculoskeletal:        General: Normal range of motion.     Cervical back: Normal range of motion and neck supple.  Skin:    General: Skin is warm and dry.     Capillary Refill: Capillary refill takes less than 2 seconds.  Neurological:     General: No focal deficit present.     Mental Status: She is alert.       Assessment & Plan:     70 year old [place] female who presents for follow-up. Please see problem-based charting for assessment and plan.       Attestation for Student Documentation:  I personally was present and performed or re-performed the history, physical exam and medical decision-making activities of this service and have verified  that the service and findings are accurately documented in the student's note.  Madalyn Rob, MD 02/23/2021, 7:42 AM

## 2021-02-22 NOTE — Assessment & Plan Note (Addendum)
A1c has not been checked since starting metformin '500mg'$  daily. Prior A1c 5.7. She declined checking A1c today. She denies polydipsia, polyuria, paresthesias, and increased appetite. We will continue current regimen and re-check A1c at next visit.   Plan: -Continue Metformin '500mg'$  daily -Follow-up in 3 months, re-check A1c

## 2021-02-22 NOTE — Patient Instructions (Addendum)
It was nice to meet you! Today we talked about your blood pressure, pain when you urinate, and prediabetes.   Your blood pressure looks great. Keep taking your medicine.   We will check your urine to see if there is bacteria present, and if so we will prescribe antibiotics to help. The pain could also be a normal part of aging, which we can see on a vaginal exam. If your urine does not grow bacteria, we can do a vaginal exam at your next appointment. We will call you with the results.   Next visit we will do a finger stick to check for diabetes.   Follow-up: 3 months or sooner if needed  R?t vui ???c g?p b?n! Hm nay chng ti ? ni v? huy?t p c?a b?n, ?au khi b?n ?i ti?u v ti?n ti?u ???ng.  Huy?t p c?a b?n c v? t?t. Ti?p t?c dng thu?c c?a b?n.  Chng ti s? ki?m tra n??c ti?u c?a b?n ?? xem c vi khu?n hay khng v n?u c, chng ti s? k ??n thu?c khng sinh ?? gip b?n. C?n ?au c?ng c th? l m?t ph?n bnh th??ng c?a qu trnh lo ha m [place] ta c th? th?y khi khm m ??o. N?u n??c ti?u c?a b?n khng pht tri?n vi khu?n, chng ti c th? khm m ??o vo bu?i h?n ti?p theo. Chng ti s? g?i cho b?n v?i k?t qu?Marland Kitchen  L?n th?m khm ti?p theo, chng ti s? th?c hi?n m?t que ch?c ngn tay ?? ki?m tra b?nh ti?u ???ng.  Theo di: 3 thng ho?c s?m h?n n?u c?n

## 2021-02-22 NOTE — Progress Notes (Signed)
                                                                                                                                                             Patient Name: Jillian Lutz MRN: [id number] DOB: 02-11-1951 Referring Physician: [name] (Profile Not Attached) Date of Service: 02/14/2021 [facility], [place]                                                        End Of Treatment Note  Diagnoses: C20-Malignant neoplasm of rectum  Cancer Staging: Stage IIIB, cT3N1 adenocarcinoma of the rectum  Intent: Curative  Radiation Treatment Dates: 01/04/2021 through 02/14/2021 Site Technique Total Dose (Gy) Dose per Fx (Gy) Completed Fx Beam Energies  Rectum: Rectum 3D 45/45 1.8 25/25 10X, 15X  Rectum: Rectum_Bst 3D 5.4/5.4 1.8 3/3 10X, 15X   Narrative: The patient tolerated radiation therapy relatively well. She did have difficulty with painful bowel movements toward the end of radiotherapy.  Plan: The patient will receive a call in about one month from the radiation oncology department. She will continue follow up with Dr. Burr Medico but she declined surgery.  ________________________________________________    Carola Rhine, PAC

## 2021-02-22 NOTE — Assessment & Plan Note (Addendum)
-  Colonoscopy done 12/01/2020 revealing rectal adenocarcinoma -Reported normal mammogram in 2018 -TDAP and Pneumovax 23 last administered in August 2020   -DEXA scan never done, 2 prior referrals made but both expired -Eye exam July 2022 revealing bilateral cataracts  -Finances and transportation are main barriers, patient has orange card and congregational nurse Lyman Speller [phone]) helps arrange appointments  Need to discuss DEXA scan at next visit given patient's age and long-history of dysuria, which could be recurrent UTIs secondary to genitourinary symptom of menopause. Patient declined vaginal exam today, which I suspect could be cultural. Tried to normalize symptoms and encouraged her to consider vaginal exam at next visit.

## 2021-02-23 ENCOUNTER — Other Ambulatory Visit (HOSPITAL_COMMUNITY): Payer: Self-pay

## 2021-02-23 ENCOUNTER — Encounter: Payer: Self-pay | Admitting: Internal Medicine

## 2021-02-23 LAB — URINALYSIS, ROUTINE W REFLEX MICROSCOPIC
Bilirubin, UA: NEGATIVE
Glucose, UA: NEGATIVE
Ketones, UA: NEGATIVE
Nitrite, UA: NEGATIVE
Protein,UA: NEGATIVE
RBC, UA: NEGATIVE
Specific Gravity, UA: 1.011 (ref 1.005–1.030)
Urobilinogen, Ur: 0.2 mg/dL (ref 0.2–1.0)
pH, UA: 6.5 (ref 5.0–7.5)

## 2021-02-23 LAB — MICROSCOPIC EXAMINATION
Bacteria, UA: NONE SEEN
Casts: NONE SEEN /lpf
Epithelial Cells (non renal): NONE SEEN /hpf (ref 0–10)
RBC, Urine: NONE SEEN /hpf (ref 0–2)
WBC, UA: NONE SEEN /hpf (ref 0–5)

## 2021-02-23 MED FILL — Metformin HCl Tab ER 24HR 500 MG: ORAL | 30 days supply | Qty: 30 | Fill #3 | Status: AC

## 2021-02-23 MED FILL — Amlodipine Besylate Tab 5 MG (Base Equivalent): ORAL | 30 days supply | Qty: 30 | Fill #3 | Status: AC

## 2021-02-23 NOTE — Congregational Nurse Program (Signed)
Home visit with interpreter [name] to deliver prescription refills for Amlodipine, Lisinopril/HCTZ and Metformin.  Also took Vitamin B-12 at her request.  Patient stated her appetite has returned and she is feeling better since completing chemo and radiation.  She plans to return to work on Monday 02/28/2021.  Jake Michaelis RN, Congregational Nurse [phone]

## 2021-02-23 NOTE — Progress Notes (Signed)
This is a Careers information officer Note.  The care of the patient was discussed with Dr. Court Joy and the assessment and plan was formulated with their assistance.  Please see their note for official documentation of the patient encounter.   Subjective:     Patient ID: Jillian Lutz, female   DOB: 1950/10/20, 70 y.o.   MRN: [id number]  [name] is a 70 year old [place] female with HTN, pre-diabetes, and rectal adenocarcinoma s/p chemoradiation. She is accompanied by her interpreter, Daih.   She was diagnosed with rectal adenocarcinoma stage IIIB after a colonoscopy on 5/18 and subsequently began chemoradiation with concurrent oral Xeloda. She finished her treatment on 7/29, and still is having decreased appetite. At her last day of treatment, she noted long-standing dysuria, which today she says has been present for about 10 years. Her oncologist cultured her urine and it grew ampicillin resistant E. Coli, so she took a 3 day course of ciprofloxacin. She states the antibiotics helped significantly but her dysuria has returned since stopping them.   She is feeling significant stress about upcoming bills, as she is the sole supporter of herself and her husband, but has had to take time off work because of her illness. 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 She petitioned to sponsor her son to come from [place] to help care for her, and he has an upcoming interview in August with [employer].   She recently saw Dr. Genevie Ann at [facility] and was diagnosed with cataracts, but otherwise her eye exam was unremarkable. She is getting glasses next week and [facility] is coordinating her cataract surgery.   Review of Systems  Constitutional:  Positive for appetite change and fatigue. Negative for chills.  Respiratory:  Negative for cough.   Cardiovascular:  Negative for leg swelling.  Gastrointestinal:  Negative for abdominal pain and  rectal pain.  Endocrine: Negative for polydipsia and polyuria.  Genitourinary:  Positive for dysuria. Negative for flank pain, frequency, hematuria and vaginal discharge.  Musculoskeletal:  Positive for back pain.  Neurological:  Negative for dizziness and headaches.  Psychiatric/Behavioral:  The patient is nervous/anxious.       Objective:   Today's Vitals   02/22/21 0850  BP: 126/83  Pulse: 60  Temp: 98.5 F (36.9 C)  TempSrc: Oral  SpO2: 99%  Weight: 119 lb 11.2 oz (54.3 kg)  Height: '4\' 10"'$  (1.473 m)   Body mass index is 25.02 kg/m.   Physical Exam Vitals reviewed.  HENT:     Nose: Nose normal.     Mouth/Throat:     Mouth: Mucous membranes are moist.     Pharynx: Oropharynx is clear.  Cardiovascular:     Rate and Rhythm: Normal rate and regular rhythm.  Pulmonary:     Effort: Pulmonary effort is normal.     Breath sounds: Normal breath sounds.  Musculoskeletal:        General: Normal range of motion.     Cervical back: Normal range of motion and neck supple.  Skin:    General: Skin is warm and dry.     Capillary Refill: Capillary refill takes less than 2 seconds.  Neurological:     General: No focal deficit present.     Mental Status: She is alert.      Assessment & Plan:     70 year old [place] female who presents for follow-up. Please see problem-based  charting for assessment and plan.

## 2021-02-23 NOTE — Progress Notes (Signed)
Attestation for Student Documentation:  I personally was present and performed or re-performed the history, physical exam and medical decision-making activities of this service and have verified that the service and findings are accurately documented in the student's note.  Madalyn Rob, MD 02/23/2021, 3:39 PM

## 2021-02-25 LAB — URINE CULTURE: Organism ID, Bacteria: NO GROWTH

## 2021-02-28 ENCOUNTER — Ambulatory Visit: Payer: Self-pay | Admitting: Hematology

## 2021-02-28 ENCOUNTER — Other Ambulatory Visit: Payer: Self-pay

## 2021-03-01 NOTE — Progress Notes (Signed)
Discussed lab results with congregational nurse. Results do not suggest UTI. She will discuss recommendation for pelvic exam to see if she would be willing for a female provider to perform.

## 2021-03-02 ENCOUNTER — Telehealth: Payer: Self-pay

## 2021-03-02 NOTE — Telephone Encounter (Signed)
Phone call to patient to inform her that Dr. Court Joy called to say that her urine culture results were negative.  He recommended she consider allowing exam of perineum if problems persist with UTI's. This exam could be done by female MD.  Patient stated she would consider it but responded by saying she is afraid they would find more cancer even though she admits to long term issue with UTI's.  Patient inquired about status of glasses which have been ordered.  CN will call [facility] for update.  Jake Michaelis RN, Congregational Nurse [phone]

## 2021-03-03 NOTE — Progress Notes (Signed)
Internal Medicine Clinic Attending  I saw and evaluated the patient.  I personally confirmed the key portions of the history and exam documented by Dr. Steen and I reviewed pertinent patient test results.  The assessment, diagnosis, and plan were formulated together and I agree with the documentation in the resident's note.     

## 2021-03-07 ENCOUNTER — Other Ambulatory Visit: Payer: Self-pay

## 2021-03-07 ENCOUNTER — Encounter: Payer: Self-pay | Admitting: Hematology

## 2021-03-07 ENCOUNTER — Inpatient Hospital Stay: Payer: Self-pay | Attending: Nurse Practitioner | Admitting: Hematology

## 2021-03-07 ENCOUNTER — Inpatient Hospital Stay: Payer: Self-pay

## 2021-03-07 VITALS — BP 128/77 | HR 62 | Temp 98.5°F | Resp 18 | Ht <= 58 in | Wt 119.6 lb

## 2021-03-07 DIAGNOSIS — B9620 Unspecified Escherichia coli [E. coli] as the cause of diseases classified elsewhere: Secondary | ICD-10-CM | POA: Insufficient documentation

## 2021-03-07 DIAGNOSIS — C182 Malignant neoplasm of ascending colon: Secondary | ICD-10-CM | POA: Insufficient documentation

## 2021-03-07 DIAGNOSIS — C19 Malignant neoplasm of rectosigmoid junction: Secondary | ICD-10-CM | POA: Insufficient documentation

## 2021-03-07 DIAGNOSIS — C20 Malignant neoplasm of rectum: Secondary | ICD-10-CM

## 2021-03-07 LAB — CMP (CANCER CENTER ONLY)
ALT: 7 U/L (ref 0–44)
AST: 15 U/L (ref 15–41)
Albumin: 3.7 g/dL (ref 3.5–5.0)
Alkaline Phosphatase: 50 U/L (ref 38–126)
Anion gap: 9 (ref 5–15)
BUN: 12 mg/dL (ref 8–23)
CO2: 25 mmol/L (ref 22–32)
Calcium: 8.8 mg/dL — ABNORMAL LOW (ref 8.9–10.3)
Chloride: 106 mmol/L (ref 98–111)
Creatinine: 0.82 mg/dL (ref 0.44–1.00)
GFR, Estimated: >60 mL/min (ref >60)
Glucose, Bld: 142 mg/dL — ABNORMAL HIGH (ref 70–99)
Potassium: 4 mmol/L (ref 3.5–5.1)
Sodium: 140 mmol/L (ref 135–145)
Total Bilirubin: 0.7 mg/dL (ref 0.3–1.2)
Total Protein: 6.8 g/dL (ref 6.5–8.1)

## 2021-03-07 LAB — CBC WITH DIFFERENTIAL (CANCER CENTER ONLY)
Abs Immature Granulocytes: 0.02 10*3/uL (ref 0.00–0.07)
Basophils Absolute: 0.1 10*3/uL (ref 0.0–0.1)
Basophils Relative: 1 %
Eosinophils Absolute: 0.2 10*3/uL (ref 0.0–0.5)
Eosinophils Relative: 3 %
HCT: 34.2 % — ABNORMAL LOW (ref 36.0–46.0)
Hemoglobin: 11 g/dL — ABNORMAL LOW (ref 12.0–15.0)
Immature Granulocytes: 0 %
Lymphocytes Relative: 16 %
Lymphs Abs: 0.7 10*3/uL (ref 0.7–4.0)
MCH: 26.3 pg (ref 26.0–34.0)
MCHC: 32.2 g/dL (ref 30.0–36.0)
MCV: 81.6 fL (ref 80.0–100.0)
Monocytes Absolute: 0.4 10*3/uL (ref 0.1–1.0)
Monocytes Relative: 8 %
Neutro Abs: 3.2 10*3/uL (ref 1.7–7.7)
Neutrophils Relative %: 72 %
Platelet Count: 191 10*3/uL (ref 150–400)
RBC: 4.19 MIL/uL (ref 3.87–5.11)
RDW: 15.2 % (ref 11.5–15.5)
WBC Count: 4.5 10*3/uL (ref 4.0–10.5)
nRBC: 0 % (ref 0.0–0.2)

## 2021-03-07 LAB — CEA (IN HOUSE-CHCC): CEA (CHCC-In House): 3.13 ng/mL (ref 0.00–5.00)

## 2021-03-07 NOTE — Progress Notes (Signed)
[place]   [phone] Fax:([phone]) [phone]   Clinic Follow up Note   Patient Care Team: Gaylan Gerold, DO as PCP - General (Internal Medicine) Jonnie Finner, RN (Inactive) as Oncology Nurse Navigator Alla Feeling, NP as Nurse Practitioner (Nurse Practitioner) [name], MD as Consulting Physician (Hematology and Oncology) Kyung Rudd, MD as Consulting Physician (Radiation Oncology)  Date of Service:  03/07/2021  CHIEF COMPLAINT: f/u of rectal cancer  SUMMARY OF ONCOLOGIC HISTORY: Oncology History Overview Note  Cancer Staging Rectal adenocarcinoma [facility]) Staging form: Colon and Rectum, AJCC 8th Edition - Clinical stage from 12/21/2020: Stage IIIB (cT3, cN1, cM0) - Unsigned    Rectal adenocarcinoma ([place])  08/26/2020 Miscellaneous   Initial presentation to PCP, reporting intermittent BRBPR since 02/2020    12/01/2020 Procedure   Colonoscopy by Dr. Silverio Decamp findings - The perianal and digital rectal examinations were normal. - A 15 mm polyp was found in the ascending colon. The polyp was semi-pedunculated. Resection and retrieval were complete. - A 25 mm polyp was found in the sigmoid colon. The polyp was pedunculated. Resection and retrieval were complete. - An infiltrative partially obstructing large mass was found in the proximal rectum. The mass was partially circumferential (involving one-half of the lumen circumference). The mass measured eight cm in length extending from 10 to 18cm from anal verge. This was biopsied with a cold forceps for histology. Proximal and distal opposite fold area of the mass lesion was tattooed with an injection of total 3 mL of Spot (carbon black).   12/01/2020 Initial Biopsy   Diagnosis 1. Colon, polyp(s), ascending x 1 - ADENOCARCINOMA ARISING IN A TUBULAR ADENOMA WITH HIGH-GRADE DYSPLASIA. SEE NOTE 2. Colon, sigmoid polyp, x1 - TUBULOVILLOUS ADENOMA(S) - NEGATIVE FOR HIGH-GRADE DYSPLASIA OR MALIGNANCY 3. Rectum,  biopsy - ADENOCARCINOMA. SEE NOTE   12/01/2020 Cancer Staging   Cancer Staging Rectal adenocarcinoma [facility]) Staging form: Colon and Rectum, AJCC 8th Edition - Clinical stage from 12/21/2020: Stage IIIB (cT3, cN1, cM0) - Unsigned    12/06/2020 Imaging   CT CAP with contrast IMPRESSION: 1. There is partially circumferential soft tissue thickening of the mid to superior rectum, approximately 5 cm in length and the inferior extent approximately 6 cm above the anal verge, consistent with primary rectal malignancy identified by colonoscopy. 2. There appear to be abnormally enlarged perirectal lymph nodes posteriorly about the superior rectum measuring up to 1.0 x 0.8 cm. Findings are suspicious for perirectal nodal metastatic disease, however rectal MRI is the test of choice for initial local staging of rectal cancer. 3. There is a 4 mm nonspecific pulmonary nodule of the superior segment left lower lobe, statistically most likely incidental, infectious or inflammatory, although nonspecific and isolated metastatic disease is not strictly excluded. Attention on follow-up. 4. No other evidence of metastatic disease in the chest, abdomen, or pelvis. Aortic Atherosclerosis (ICD10-I70.0).   12/09/2020 Imaging   Local staging MRI pelvis without contrast IMPRESSION: 4.9 cm circumferential mid/lower rectal mass, corresponding to the patient's newly diagnosed rectal cancer. Rectal adenocarcinoma T stage: T3c Rectal adenocarcinoma N stage:  N1 Distance from tumor to the internal anal sphincter is 3.7 cm.   12/21/2020 Initial Diagnosis   Rectal adenocarcinoma ([place])   01/04/2021 -  Chemotherapy   Concurrent chemoradiation with Xeloda '1000mg'$  in the AM and '1500mg'$  in the PM on days of Radiation starting 01/04/21.       01/04/2021 - 02/11/2021 Radiation Therapy   Concurrent chemoradiation with Dr Lisbeth Renshaw and Xeloda starting  01/04/21.       CURRENT THERAPY:  Surveillance  INTERVAL HISTORY:  Jillian Lutz is here for a follow up of rectal cancer. She was last seen by me on 02/07/21. She presents to the clinic accompanied by an interpreter. She reports her appetite and energy have improved. She reports her urinary symptoms have resolved with Cipro. She denies any bleeding with bowel movement. She states she does not want to follow up with scans or procedures. She notes she is willing to follow up clinically and will call us if she experienced any new symptoms.   All other systems were reviewed with the patient and are negative.  MEDICAL HISTORY:  Past Medical History:  Diagnosis Date   Bilateral wrist pain 10/25/2017   Diabetes mellitus without complication ([place])    Hypertension    Neck mass 1998   Unknown biopsy results. Excised in [place].   Trigger finger, acquired 02/08/2017    SURGICAL HISTORY: Past Surgical History:  Procedure Laterality Date   NECK SURGERY Left 1998   Excision of mass in [place]    I have reviewed the social history and family history with the patient and they are unchanged from previous note.  ALLERGIES:  has No Known Allergies.  MEDICATIONS:  Current Outpatient Medications  Medication Sig Dispense Refill   amLODipine (NORVASC) 5 MG tablet TAKE 1 TABLET (5 MG TOTAL) BY MOUTH DAILY. 90 tablet 3   lisinopril-hydrochlorothiazide (ZESTORETIC) 20-12.5 MG tablet TAKE 2 TABLETS BY MOUTH DAILY. 60 tablet 3   metFORMIN (GLUCOPHAGE-XR) 500 MG 24 hr tablet TAKE 1 TABLET (500 MG TOTAL) BY MOUTH DAILY WITH BREAKFAST. 30 tablet 5   No current facility-administered medications for this visit.    PHYSICAL EXAMINATION: ECOG PERFORMANCE STATUS: 1 - Symptomatic but completely ambulatory  Vitals:   03/07/21 1058  BP: 128/77  Pulse: 62  Resp: 18  Temp: 98.5 F (36.9 C)  SpO2: 95%   Wt Readings from Last 3 Encounters:  03/07/21 119 lb 9.6 oz (54.3 kg)  02/22/21 119 lb 11.2 oz (54.3 kg)  02/07/21 117 lb 6.4 oz (53.3 kg)     GENERAL:alert, no distress and  comfortable SKIN: skin color normal, no rashes or significant lesions EYES: normal, Conjunctiva are pink and non-injected, sclera clear  NEURO: alert & oriented x 3 with fluent speech  LABORATORY DATA:  I have reviewed the data as listed CBC Latest Ref Rng & Units 03/07/2021 02/07/2021 01/31/2021  WBC 4.0 - 10.5 K/uL 4.5 4.0 3.9(L)  Hemoglobin 12.0 - 15.0 g/dL 11.0(L) 11.1(L) 11.3(L)  Hematocrit 36.0 - 46.0 % 34.2(L) 33.8(L) 34.3(L)  Platelets 150 - 400 K/uL 191 182 186     CMP Latest Ref Rng & Units 03/07/2021 02/07/2021 01/31/2021  Glucose 70 - 99 mg/dL 142(H) 87 89  BUN 8 - 23 mg/dL '12 15 13  '$ Creatinine 0.44 - 1.00 mg/dL 0.82 0.77 0.70  Sodium 135 - 145 mmol/L 140 141 141  Potassium 3.5 - 5.1 mmol/L 4.0 3.9 4.2  Chloride 98 - 111 mmol/L 106 107 107  CO2 22 - 32 mmol/L '25 26 28  '$ Calcium 8.9 - 10.3 mg/dL 8.8(L) 9.0 8.9  Total Protein 6.5 - 8.1 g/dL 6.8 7.0 7.0  Total Bilirubin 0.3 - 1.2 mg/dL 0.7 0.7 0.3  Alkaline Phos 38 - 126 U/L 50 45 44  AST 15 - 41 U/L 15 15 14(L)  ALT 0 - 44 U/L 7 <6 <6      RADIOGRAPHIC STUDIES: I have personally reviewed  the radiological images as listed and agreed with the findings in the report. No results found.   ASSESSMENT & PLAN:  Jillian Lutz is a 70 y.o. female with   1.  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 Ascending colon polyp and rectal biopsies both showed adenocarcinoma. Per the report, the ascending colon biopsy was completely retrieved and resected. -Staging work-up shows perirectal lymph node involvement and a single nonspecific left pulmonary nodule.  She has at least locally advanced disease, which is curable with multimodal  approach -Despite our strong recommendation, patient firmly and repeatedly declined IV chemo and surgery. -she tolerated chemRT well with Xarelto, completed on 02/14/21. -We discussed repeating scan and sigmoidoscopy to evaluate her response to chemoradiation, however she is not interested in knowing her response to treatment, and declined any surveillance measures, such as CT or colonoscopy. She is willing to follow up clinically.  She states she would consider oral chemo again if she developed more cancer related symptoms.  -labs and f/u in 3 months.  She knows what to watch, especially hematochezia and change of her bowel habit.   2. Symptom Management: low appetite, dysuria -mild anemia on Xeloda, stable. Hgb 11.0 today (03/07/21) -rectal bleeding and pain resolved -she reports a burning sensation with urination and also notes her urine is dark in color. She was found to have E coli infection and was treated with Cipro.   3. Social support -She is here with her husband, moved from [place] in 2014.  Her children remain in [place], spanning ages 50-50 -She does not have a driver's license, she was previously referred to social work for transportation and financial support -I reviewed her children's risk for CRC and the need to begin screening by at least age 69.  They do not have reliable colonoscopy screenings in [place] -Ms. 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 I previously provided her with letter for her son to come from [place].  -She has orange card. She was approved for free drug replacement for Xeloda. She would be able to get IV Chemo free as well but not interested in IV Chemo. -She has Anice Paganini that help with legal matters. She has help from Automotive engineer. Her interpretor is  her first point of contact and congressional nurse her second.    4.  HTN, DM, pre-existing neuropathy -Today, she reports she is only taking lisinopril, amlodipine, and metformin. No other medications.     PLAN -She is recovering well from chemo and radiation.  She declined surveillance CT and colonoscopy. -Labs and f/u in 3 months    No problem-specific Assessment & Plan notes found for this encounter.   No orders of the defined types were placed in this encounter.  All questions were answered. The patient knows to call the clinic with any problems, questions or concerns. No barriers to learning was detected. The total time spent in the appointment was 30 minutes.     [name], MD 03/07/2021   I, Wilburn Mylar,  am acting as scribe for [name], MD.   I have reviewed the above documentation for accuracy and completeness, and I agree with the above.

## 2021-03-15 ENCOUNTER — Ambulatory Visit
Admission: RE | Admit: 2021-03-15 | Discharge: 2021-03-15 | Disposition: A | Discharge status: Home / Self Care | Payer: Self-pay | Source: Ambulatory Visit | Attending: Hematology | Admitting: Hematology

## 2021-03-15 DIAGNOSIS — C20 Malignant neoplasm of rectum: Secondary | ICD-10-CM

## 2021-03-18 NOTE — Progress Notes (Signed)
  Radiation Oncology         [phone] ________________________________  Name: Jillian Lutz MRN: [id number]  Date of Service: 03/15/2021  DOB: Jun 03, 1951  Post Treatment Telephone Note  Diagnosis:   Stage IIIB, cT3N1 adenocarcinoma of the rectum  Interval Since Last Radiation:  4 weeks   01/04/2021 through 02/14/2021 Site Technique Total Dose (Gy) Dose per Fx (Gy) Completed Fx Beam Energies  Rectum: Rectum 3D 45/45 1.8 25/25 10X, 15X  Rectum: Rectum_Bst 3D 5.4/5.4 1.8 3/3 10X, 15X    Narrative:  The patient was contacted today for routine follow-up. During treatment she did very well with radiotherapy and did not have significant desquamation.    Impression/Plan: 1. Stage IIIB, cT3N1 adenocarcinoma of the rectum. I was unable to reach the patient by phone but we would be happy to continue to follow her as needed, but she will also continue to follow up with Dr. Burr Medico in medical oncology.      Carola Rhine, PAC

## 2021-03-22 ENCOUNTER — Other Ambulatory Visit (HOSPITAL_COMMUNITY): Payer: Self-pay

## 2021-03-23 NOTE — Congregational Nurse Program (Signed)
Home visit with interpreter [name]. Delivered prescriptions for Amlodipine, Lisinopril/HCTZ and Metformin which were picked up from [place] [facility].  Reviewed purpose and directions for each. Patient states she is feeling good and continues to work.  She stated that for the past month she has occasional itching everywhere including her face.  Benadryl relieves itching.  Denies rash or hives with itching.  CN will contact PCP to determine if she needs to be seen.  Jake Michaelis RN, Congregational Nurse [phone]

## 2021-04-06 NOTE — Congregational Nurse Program (Signed)
Home visit with interpreter [name].  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  Jake Michaelis RN, Congregational Nurse [phone]

## 2021-04-27 ENCOUNTER — Other Ambulatory Visit (HOSPITAL_COMMUNITY): Payer: Self-pay

## 2021-04-28 NOTE — Congregational Nurse Program (Signed)
Home visit to deliver Amlodipine, Lisinopril and Metformin which were picked up from [facility] out-patient pharmacy.  Patient had received bill from [employer].  Phone call to [place] explaining that a financial assistance application had been submitted via e-mail and fax.  Representative stated it needed to be mailed and even thought it has already been sent to collections we can still resubmit application. It was sent today via [place].  Jake Michaelis RN, Congregational Nurse [phone]

## 2021-05-20 ENCOUNTER — Other Ambulatory Visit (HOSPITAL_COMMUNITY): Payer: Self-pay

## 2021-05-24 ENCOUNTER — Other Ambulatory Visit (HOSPITAL_COMMUNITY): Payer: Self-pay

## 2021-06-06 ENCOUNTER — Inpatient Hospital Stay: Payer: Self-pay

## 2021-06-06 ENCOUNTER — Inpatient Hospital Stay: Payer: Self-pay | Attending: Nurse Practitioner | Admitting: Hematology

## 2021-06-06 DIAGNOSIS — C20 Malignant neoplasm of rectum: Secondary | ICD-10-CM

## 2021-06-15 ENCOUNTER — Other Ambulatory Visit (HOSPITAL_COMMUNITY): Payer: Self-pay

## 2021-06-15 ENCOUNTER — Other Ambulatory Visit: Payer: Self-pay | Admitting: Internal Medicine

## 2021-06-15 DIAGNOSIS — I10 Essential (primary) hypertension: Secondary | ICD-10-CM

## 2021-06-16 ENCOUNTER — Other Ambulatory Visit (HOSPITAL_COMMUNITY): Payer: Self-pay

## 2021-06-16 MED ORDER — LISINOPRIL-HYDROCHLOROTHIAZIDE 20-12.5 MG PO TABS
2.0000 | ORAL_TABLET | Freq: Every day | ORAL | 3 refills | Status: DC
  Filled 2021-06-16: qty 60, 30d supply, fill #0
  Filled 2021-07-20: qty 60, 30d supply, fill #1
  Filled 2021-08-24: qty 60, 30d supply, fill #2
  Filled 2021-09-22: qty 60, 30d supply, fill #3

## 2021-06-16 NOTE — Congregational Nurse Program (Signed)
Patient called to state she is almost out of medicine. It was last picked up on 05/24/2021 but she only has 2 days left.  Admitted that sometimes she wasn't sure if she had taken it so possibly took double dose a few times.  Stressed importance of taking as ordered.  She agreed to start filling a pill box to help better manage her medications.  CN called in refill request to [place], picked up and delivered 3 prescriptions ( Amlodipine, Metformin and Lisinopril/HCTZ to patient.  Jake Michaelis RN, Congregational Nurse [phone]

## 2021-07-20 ENCOUNTER — Other Ambulatory Visit (HOSPITAL_COMMUNITY): Payer: Self-pay

## 2021-07-22 NOTE — Congregational Nurse Program (Signed)
Home visit to deliver medicine which was picked up from [facility] ( Lisinopril/HCTZ, Metformin, and Amlodipine).  Informed patient that she has an appointment at [facility] Internal Medicine clinic on Jan. 11, 2023 at 10:45 am and that CN will request transportation.  Judie Petit'

## 2021-07-27 ENCOUNTER — Encounter: Payer: Self-pay | Admitting: Student

## 2021-07-27 ENCOUNTER — Ambulatory Visit (INDEPENDENT_AMBULATORY_CARE_PROVIDER_SITE_OTHER): Payer: Self-pay | Admitting: Student

## 2021-07-27 VITALS — BP 156/84 | HR 60 | Ht <= 58 in | Wt 119.3 lb

## 2021-07-27 DIAGNOSIS — H538 Other visual disturbances: Secondary | ICD-10-CM

## 2021-07-27 DIAGNOSIS — R7303 Prediabetes: Secondary | ICD-10-CM

## 2021-07-27 DIAGNOSIS — I10 Essential (primary) hypertension: Secondary | ICD-10-CM

## 2021-07-27 DIAGNOSIS — H269 Unspecified cataract: Secondary | ICD-10-CM

## 2021-07-27 DIAGNOSIS — C20 Malignant neoplasm of rectum: Secondary | ICD-10-CM

## 2021-07-27 DIAGNOSIS — R300 Dysuria: Secondary | ICD-10-CM

## 2021-07-27 LAB — POCT GLYCOSYLATED HEMOGLOBIN (HGB A1C): Hemoglobin A1C: 5.9 % — AB (ref 4.0–5.6)

## 2021-07-27 LAB — GLUCOSE, CAPILLARY: Glucose-Capillary: 78 mg/dL (ref 70–99)

## 2021-07-27 NOTE — Patient Instructions (Signed)
It was a pleasure seeing you in clinic. Today we discussed:   Blurred vision: Please follow up with the eye doctor  Blood pressure: Follow up in 1 month, please take your medications before you visit  If you have any questions or concerns, please call our clinic at [phone] between 9am-5pm and after hours call [phone] and ask for the internal medicine resident on call. If you feel you are having a medical emergency please call 911.   Thank you, we look forward to helping you remain healthy!

## 2021-07-29 NOTE — Assessment & Plan Note (Signed)
P elevated in office today she has not taken her blood pressure medicines this morning is likely why her blood pressure is elevated we will have her follow-up in 1 month to recheck her blood pressure instructed her to take her blood pressure occasions prior to her next visit.  Plan Continue amlodipine 5 mg daily Continue lisinopril-hydrochlorothiazide 40- 25 mg daily

## 2021-07-29 NOTE — Progress Notes (Signed)
° °  CC: Blurred vision  HPI:  [name] is a 71 y.o. female with history listed below presents for blurred vision in her right eye for the past 6 months. Please refer to problem based charting for further details and assessment and plan of current problem and chronic medical conditions.   Past Medical History:  Diagnosis Date   Bilateral wrist pain 10/25/2017   Diabetes mellitus without complication ([place])    Hypertension    Neck mass 1998   Unknown biopsy results. Excised in [place].   Trigger finger, acquired 02/08/2017   Review of Systems: Negative as per HPI  Physical Exam:  Vitals:   07/27/21 1052  BP: (!) 156/84  Pulse: 60  SpO2: 99%  Weight: 119 lb 4.8 oz (54.1 kg)  Height: 4\' 10"  (1.473 m)   Physical Exam Constitutional:      General: She is not in acute distress.    Appearance: Normal appearance.  HENT:     Head: Normocephalic and atraumatic.     Right Ear: External ear normal.     Left Ear: External ear normal.     Nose: Nose normal.     Mouth/Throat:     Mouth: Mucous membranes are dry.  Eyes:     General: Lids are normal.        Right eye: No discharge.        Left eye: No discharge.     Extraocular Movements: Extraocular movements intact.     Conjunctiva/sclera: Conjunctivae normal.     Pupils: Pupils are equal, round, and reactive to light.     Comments: Diminished vision grossly in both eyes was able to see largest letters on Snellen chart with her left eye but was blurred with her right eye  Cardiovascular:     Rate and Rhythm: Normal rate and regular rhythm.  Pulmonary:     Effort: Pulmonary effort is normal.     Breath sounds: Normal breath sounds.  Abdominal:     General: Abdomen is flat. Bowel sounds are normal.     Palpations: Abdomen is soft.  Musculoskeletal:        General: Normal range of motion.     Right lower leg: No edema.     Left lower leg: No edema.  Skin:    General: Skin is warm and dry.     Capillary Refill: Capillary  refill takes less than 2 seconds.  Neurological:     General: No focal deficit present.     Mental Status: She is alert and oriented to person, place, and time.     Assessment & Plan:   See Encounters Tab for problem based charting.  Patient discussed with Dr. Jimmye [place]

## 2021-07-29 NOTE — Assessment & Plan Note (Signed)
Is continue take metformin 500 mg daily.  A1c of 5.9 today.  We will recheck this in 1 year.  Continue to encourage diet and lifestyle modifications to help prevent worsening of insulin resistance.

## 2021-07-29 NOTE — Assessment & Plan Note (Signed)
States symptoms of dysuria have resolved since last visit.  We will discuss this further if symptoms return.

## 2021-07-29 NOTE — Assessment & Plan Note (Signed)
History of stage IIIb rectal adenocarcinoma followed by oncology that was diagnosed in June 2022.  Patient opted not to undergo surgery or IV chemotherapy.  She reports she completed oral chemotherapy and radiation.  Declined a sigmoidoscopy to evaluate for treatment response.  CEA level of 3.134 months ago getting good treatment response.  Patient reiterates that she would not want further colonoscopies or surgical management of her adenocarcinoma.  She denies any blood in her stool, melena, weight loss, fever, chills.  She will continue to follow-up with oncology with Dr. Burr Medico.

## 2021-07-29 NOTE — Assessment & Plan Note (Signed)
Reports she has a history of bilateral cataracts that were removed back when she lived in [place] denies having seeing an eye doctor in the last year.  Does not recall seeing the ophthalmologist after her last visit concerning this in March 2022.  Notes progressively declining vision primarily in her right eye over the last 6 months.  According to prior notes appears that she may have seen eye doctor in July and was diagnosed with bilateral cataracts.  Also has history of hypertension difficult to determine whether or not this has been well controlled and she has not taken her medications today.  Although BP in past visits have been better controlled.  She also has a history of prediabetes could have a component of diabetic retinopathy although given lack of long-term history of uncontrolled blood sugars this is less likely.  On exam today grossly was able to read the largest letters only on Snellen chart with her left eye but was not able to recognize any letters or numbers on the Snellen chart with her right eye  Plan Referral to ophthalmology

## 2021-08-10 ENCOUNTER — Encounter: Payer: Self-pay | Admitting: Student

## 2021-08-10 NOTE — Progress Notes (Signed)
Internal Medicine Clinic Attending ? ?Case discussed with Dr. Liang  At the time of the visit.  We reviewed the resident?s history and exam and pertinent patient test results.  I agree with the assessment, diagnosis, and plan of care documented in the resident?s note. ? ?

## 2021-08-15 ENCOUNTER — Encounter: Payer: Self-pay | Admitting: Student

## 2021-08-24 ENCOUNTER — Other Ambulatory Visit (HOSPITAL_COMMUNITY): Payer: Self-pay

## 2021-08-24 NOTE — Congregational Nurse Program (Signed)
Home visit with interpreter [name]. Helped patient complete Sport and exercise psychologist.  She also stated she needs needs refills for all mediations.  CN will request refills from [place] and pick up for patient.  Jake Michaelis RN, Congregational Nurse [phone].

## 2021-08-26 NOTE — Congregational Nurse Program (Signed)
Home visit to deliver prescriptions which were picked up from [place] (Lisinopril-hctz, Metformin, amlodipine).  Patient had paycheck stubs for the [facility] financial assistance application which CN is helping her complete and will forward to the [place] office.  Jake Michaelis RN, Congregational Nurse [phone].

## 2021-08-29 ENCOUNTER — Encounter: Payer: Self-pay | Admitting: Internal Medicine

## 2021-08-29 ENCOUNTER — Ambulatory Visit (INDEPENDENT_AMBULATORY_CARE_PROVIDER_SITE_OTHER): Payer: Self-pay | Admitting: Internal Medicine

## 2021-08-29 ENCOUNTER — Other Ambulatory Visit (HOSPITAL_COMMUNITY): Payer: Self-pay

## 2021-08-29 ENCOUNTER — Other Ambulatory Visit: Payer: Self-pay

## 2021-08-29 DIAGNOSIS — C20 Malignant neoplasm of rectum: Secondary | ICD-10-CM

## 2021-08-29 DIAGNOSIS — I10 Essential (primary) hypertension: Secondary | ICD-10-CM

## 2021-08-29 DIAGNOSIS — H269 Unspecified cataract: Secondary | ICD-10-CM

## 2021-08-29 DIAGNOSIS — H6121 Impacted cerumen, right ear: Secondary | ICD-10-CM

## 2021-08-29 MED ORDER — CARBAMIDE PEROXIDE 6.5 % OT SOLN
5.0000 [drp] | Freq: Two times a day (BID) | OTIC | 0 refills | Status: AC
  Filled 2021-08-29: qty 3.5, 7d supply, fill #0

## 2021-08-29 NOTE — Patient Instructions (Addendum)
Ms Lorane Cousar,  It was a pleasure seeing you in clinic. Today we discussed:   Right eye blurriness: I will follow up on the referral to your ophthalmologist.  Right ear pain: I have sent a prescription for ear drops to your pharmacy. Please use one drop twice daily in your right ear for 7 days. Please let us know if there is no improvement.   Mucus in stools: I will discuss this with your GI doctors and cancer doctors. We will contact you with further recommendations.   Please continue to take all medications as recommended.   If you have any questions or concerns, please call our clinic at [phone] between 9am-5pm and after hours call [phone] and ask for the internal medicine resident on call. If you feel you are having a medical emergency please call 911.   Thank you, we look forward to helping you remain healthy!

## 2021-08-29 NOTE — Assessment & Plan Note (Signed)
Patient with history of bilateral cataracts that were removed when she was in [place] notes ongoing right sided blurry vision that has been progressively worsening over the last 6 months. Patient previously evaluated for this and has been referred to ophthalmology. However, has not been able to follow up yet.   Plan: F/u referral to ophthalmology

## 2021-08-29 NOTE — Assessment & Plan Note (Addendum)
Patient has a history of stage IIIb ascending colon and rectal adenocarcinoma diagnosed in June 2022, s/p chemotherapy and radiation, followed by oncology. Patient had previously declined sigmoidoscopy to follow treatment response but CEA levels at the time were reassuring. Patient reports doing well; however, she started noticing mucus in her stools two weeks ago. She also endorsed change in caliber of stools for this duration as well. She denies any abdominal pain, melena, hematochezia, fatigue, or weight loss. On examination, noted to have external and internal hemorrhoids but otherwise unremarkable.  Given her history, highly concerned for recurrence of her adenocarcinoma. I am reaching out to patient's oncologist and GI doctor to discuss recommendation for next best steps. She is agreeable to sigmoidoscopy or repeat colonoscopy if necessary.

## 2021-08-29 NOTE — Assessment & Plan Note (Signed)
Patient reports right ear pain with intermittent ringing in her right ear over the past three months. She denies any hearing loss or recent URI symptoms. Reports that the pain is similar to prior when she had cerumen impaction in March. On examination, noted to have significant cerumen impaction on the right, clear on the left. She reports previously having pain with attempted cerumen disimpaction in office and would like to trial ear drops first.   Plan: Debrox bid x 7 days Advised to follow up for ear irrigation if needed

## 2021-08-29 NOTE — Progress Notes (Signed)
° °  CC: hypertension follow up  HPI:  Ms.Karenann Pinkus is a 71 y.o. female with PMHx as stated below presenting for follow up of hypertension, right eye blurriness, right sided ear pain and ringing and mucus in stools with change in caliber of stools. Please see problem based charting for complete assessment and plan.   - two weeks of mucus in stools, looks like small piece of stool; occurs with every bowel movement; denies abdominal pain; denies hematochezia or melena; normal color  - hx of rectal cancer - last saw oncologist in September;  - agreeable to colonoscopy if needed  R eye blurriness: hx of bilateral cataracts; endorsed right eye blurriness progressively worsening; has not been able to see ophthalmologist yet   R sided ear pain: with associated intermittent ringing; has been since November 2022.   Past Medical History:  Diagnosis Date   Bilateral wrist pain 10/25/2017   Diabetes mellitus without complication ([place])    Hypertension    Neck mass 1998   Unknown biopsy results. Excised in [place].   Trigger finger, acquired 02/08/2017   Review of Systems:  Negative except as stated in HPI.  Physical Exam:  Vitals:   08/29/21 0943  BP: 114/69  Pulse: 68  Resp: (!) 24  Temp: 98 F (36.7 C)  TempSrc: Oral  SpO2: 100%  Weight: 117 lb 4.8 oz (53.2 kg)  Height: 4\' 10"  (1.473 m)   Physical Exam  Constitutional: Generally well appearing elderly female, no acute distress  HENT: Normocephalic and atraumatic, EOMI, conjunctiva normal, moist mucous membranes; cerumen impaction in R ear Cardiovascular: Normal rate, regular rhythm, S1 and S2 present, no murmurs, rubs, gallops.  Distal pulses intact Respiratory: Effort is normal on room air. Lungs are clear to auscultation bilaterally. GI: Nondistended, soft, nontender to palpation, normal bowel sounds Rectum: external and internal hemorrhoids appreciated; no other significant mass noted; no stool in rectal vault  Musculoskeletal:  Normal bulk and tone.  No peripheral edema noted. Neurological: Is alert and oriented x4, no apparent focal deficits noted. Skin: Warm and dry.  No rash, erythema, lesions noted. Psychiatric: Normal mood and affect.   Assessment & Plan:   See Encounters Tab for problem based charting.  Patient discussed with Dr. Jimmye Norman

## 2021-08-29 NOTE — Assessment & Plan Note (Signed)
BP Readings from Last 3 Encounters:  08/29/21 114/69  07/27/21 (!) 156/84  03/07/21 128/77   Patient is evaluated today for follow up of her hypertension. She was noted to have elevated blood pressure at previous visit in setting of not taking her blood pressure medications. Today, patient's blood pressure 114/69. Patient reports medication adherence and denies any symptoms at this time.   Plan: Continue amlodipine 5mg  daily and lisinopril-HCTZ 40-25mg  daily

## 2021-09-05 NOTE — Progress Notes (Signed)
Internal Medicine Clinic Attending ° °Case discussed with Dr. Aslam  At the time of the visit.  We reviewed the resident’s history and exam and pertinent patient test results.  I agree with the assessment, diagnosis, and plan of care documented in the resident’s note.  °

## 2021-09-22 ENCOUNTER — Other Ambulatory Visit: Payer: Self-pay | Admitting: Internal Medicine

## 2021-09-22 DIAGNOSIS — R7303 Prediabetes: Secondary | ICD-10-CM

## 2021-09-23 ENCOUNTER — Other Ambulatory Visit: Payer: Self-pay | Admitting: Internal Medicine

## 2021-09-23 ENCOUNTER — Other Ambulatory Visit (HOSPITAL_COMMUNITY): Payer: Self-pay

## 2021-09-23 DIAGNOSIS — R7303 Prediabetes: Secondary | ICD-10-CM

## 2021-09-23 MED ORDER — METFORMIN HCL ER 500 MG PO TB24
500.0000 mg | ORAL_TABLET | Freq: Every day | ORAL | 5 refills | Status: DC
Start: 2021-09-23 — End: 2021-10-26
  Filled 2021-09-23: qty 30, 30d supply, fill #0

## 2021-09-23 NOTE — Telephone Encounter (Signed)
Next appt scheduled 09/26/21 with Dr Posey Pronto. ?

## 2021-09-26 ENCOUNTER — Encounter: Payer: Self-pay | Admitting: Internal Medicine

## 2021-09-26 ENCOUNTER — Ambulatory Visit (INDEPENDENT_AMBULATORY_CARE_PROVIDER_SITE_OTHER): Payer: Self-pay | Admitting: Internal Medicine

## 2021-09-26 ENCOUNTER — Telehealth: Payer: Self-pay | Admitting: Hematology

## 2021-09-26 VITALS — BP 129/68 | HR 58 | Temp 98.3°F | Ht <= 58 in | Wt 118.6 lb

## 2021-09-26 DIAGNOSIS — I10 Essential (primary) hypertension: Secondary | ICD-10-CM

## 2021-09-26 DIAGNOSIS — R202 Paresthesia of skin: Secondary | ICD-10-CM

## 2021-09-26 DIAGNOSIS — C20 Malignant neoplasm of rectum: Secondary | ICD-10-CM

## 2021-09-26 MED ORDER — GABAPENTIN 300 MG PO CAPS: 300.0000 mg | ORAL_CAPSULE | Freq: Three times a day (TID) | ORAL | 2 refills | Status: DC

## 2021-09-26 NOTE — Telephone Encounter (Signed)
Scheduled follow-up appointment per 3/13 staff message. Patient's interpretor is aware. ?

## 2021-09-26 NOTE — Assessment & Plan Note (Addendum)
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 She follows Dr Burr Medico, but missed her last appt in Nov. She has previously declined further IV chemo, surgery, and follow up sigmoidoscopy which may have led to cure of cancer. Today, she is following up after a 4 week clinic appt for change in bowel habit. She reports ongoing pencil thin stools; no change in color, no melena, and no hematochezia. She reports daily small BM. She has no current abdominal pain. She is able to tolerate PO intake without trouble and has not noticed any weight loss, with stable weight on file since last OV. I am concerned for progression of her malignancy given change in bowel habit, and she will need close follow up with Dr Burr Medico for further eval, management, and surveillance. I have reached out to Dr Ernestina Penna office to have her scheduled ASAP for a OV, and have instructed her to call the cancer center if she does not hear back in 1-2 days.  ? ?Follow up with Dr Feng/Medical oncology  ?

## 2021-09-26 NOTE — Progress Notes (Signed)
? ?  CC: 4 week clinic follow up appt ? ?HPI: ? ?Jillian Lutz is a 71 y.o. female with a PMHx stated below and presents today for stated above. Please see the Encounters tab for problem-based Assessment & Plan for additional details.  ? ?Past Medical History:  ?Diagnosis Date  ? Bilateral wrist pain 10/25/2017  ? Diabetes mellitus without complication ([place])   ? Hypertension   ? Neck mass 1998  ? Unknown biopsy results. Excised in [place].  ? Trigger finger, acquired 02/08/2017  ? ? ?Current Outpatient Medications on File Prior to Visit  ?Medication Sig Dispense Refill  ? amLODipine (NORVASC) 5 MG tablet Take 1 tablet (5 mg total) by mouth daily. 90 tablet 3  ? lisinopril-hydrochlorothiazide (ZESTORETIC) 20-12.5 MG tablet Take 2 tablets by mouth daily. 60 tablet 3  ? metFORMIN (GLUCOPHAGE-XR) 500 MG 24 hr tablet Take 1 tablet (500 mg total) by mouth daily with breakfast. 30 tablet 5  ? ?No current facility-administered medications on file prior to visit.  ? ? ?Family History  ?Problem Relation Age of Onset  ? Headache Son   ? ? ?Social History  ? ?Socioeconomic History  ? Marital status: Married  ?  Spouse name: Floyce Stakes  ? Number of children: Not on file  ? Years of education: Not on file  ? Highest education level: Not on file  ?Occupational History  ? Not on file  ?Tobacco Use  ? Smoking status: Never  ? Smokeless tobacco: Never  ?Substance and Sexual Activity  ? Alcohol use: No  ? Drug use: No  ? Sexual activity: Not on file  ?Other Topics Concern  ? Not on file  ?Social History Narrative  ? Came to [place] from [place] in 2013.  ? Does not know much family history [place].  ? ?Social Determinants of Health  ? ?Financial Resource Strain: Not on file  ?Food Insecurity: Not on file  ?Transportation Needs: Not on file  ?Physical Activity: Not on file  ?Stress: Not on file  ?Social Connections: Not on file  ?Intimate Partner Violence: Not At Risk  ? Fear  of Current or Ex-Partner: No  ? Emotionally Abused: No  ? Physically Abused: No  ? Sexually Abused: No  ? ? ?Review of Systems: ?ROS negative except for what is noted on the assessment and plan. ? ?Vitals:  ? 09/26/21 1006  ?BP: 129/68  ?Pulse: (!) 58  ?Temp: 98.3 ?F (36.8 ?C)  ?TempSrc: Oral  ?SpO2: 100%  ?Weight: 118 lb 9.6 oz (53.8 kg)  ?Height: '4\' 10"'$  (1.473 m)  ? ? ?Physical Exam: ?Constitutional: alert, well-appearing, in NAD ?HENT: normocephalic, atraumatic, mucous membranes moist ?Eyes: conjunctiva non-erythematous, EOMI ?Cardiovascular: RRR, no m/r/g, non-edematous bilateral LE ?Pulmonary/Chest: normal work of breathing on RA, LCTAB ?Abdominal: soft, non-tender to palpation, non-distended ?MSK: normal bulk and tone  ?Neurological: A&O x 3 and follows commands, normal sensation and motor function in bilateral lower extremities.  ?Skin: warm and dry   ?Psych: normal behavior, normal affect   ? ?Assessment & Plan:  ? ?See Encounters Tab for problem based charting. ? ?Patient discussed with Dr. Philipp Ovens ? ?Lajean Manes, MD  ?Internal Medicine Resident, PGY-1 ?[facility]  ?

## 2021-09-26 NOTE — Patient Instructions (Signed)
It was a pleasure seeing you in clinic today! ? ?I am concerned that your cancer may have progress-- I want you to follow up with Dr Burr Medico as soon as possible. I have called her office to get you scheduled with an appointment and someone will be in touch with you. If you do no hear back in 1 week, please CALL [phone] for Dr Burr Medico. ? ?I have prescribed gabapentin for your feet. Please take 1 pill at bedtime daily, and then increase to 3 pills a day as needed after 1 week.  ?

## 2021-09-26 NOTE — Assessment & Plan Note (Signed)
Chronic and stable- BP 129/68 today. Continue amlodipine 5 and Lisinopril-HCTZ 40-25 mg daily.  ?

## 2021-09-26 NOTE — Assessment & Plan Note (Signed)
Long hx of bilateral leg numbness and a burning sensation for the past 2 year, pre-dating colorectal cancer diagnosis. Previous labs including B12, folate, ferritin, and TSH wnl. She was previously on gabapentin for carpel tunnel syndrome, and reports neuropathy was better controlled when she took it. She reports being out of medication for a while. Denies any other symptoms including fevers, chills, nausea, vomiting, headaches, light headedness, dizziness, weakness, or other issues. Exam without focal neurological deficits, and motor and sensory function is intact. 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 Will re-start gabapentin today, and will need close oncology follow up.  ? ?Gabapentin 300 TID ?Close oncology follow up with Dr Burr Medico ? ?

## 2021-09-27 ENCOUNTER — Other Ambulatory Visit (HOSPITAL_COMMUNITY): Payer: Self-pay

## 2021-09-27 ENCOUNTER — Telehealth: Payer: Self-pay | Admitting: *Deleted

## 2021-09-27 ENCOUNTER — Other Ambulatory Visit: Payer: Self-pay | Admitting: Internal Medicine

## 2021-09-27 DIAGNOSIS — R7303 Prediabetes: Secondary | ICD-10-CM

## 2021-09-27 DIAGNOSIS — R202 Paresthesia of skin: Secondary | ICD-10-CM

## 2021-09-27 MED ORDER — GABAPENTIN 300 MG PO CAPS
300.0000 mg | ORAL_CAPSULE | Freq: Three times a day (TID) | ORAL | 2 refills | Status: DC
  Filled 2021-09-27: qty 90, 30d supply, fill #0

## 2021-09-27 NOTE — Telephone Encounter (Signed)
Patient needs prescription for Gabapentin sent to the [facility] on the IM program.  At [facility] it will cost her over $200.  ?

## 2021-09-27 NOTE — Progress Notes (Signed)
Internal Medicine Clinic Attending  Case discussed with Dr. Patel  At the time of the visit.  We reviewed the resident's history and exam and pertinent patient test results.  I agree with the assessment, diagnosis, and plan of care documented in the resident's note.  

## 2021-09-27 NOTE — Congregational Nurse Program (Signed)
Home visit to deliver prescriptions which were picked up from [facility] (Amlodipine, Metformin and Lisinopril-HCTZ).  She stated she has another prescription at [facility].  Phone call to [employer].  Gabapentin will cost $76 there and patient is unable to afford it.  Phone call to [facility] Internal Medicine triage nurse Regino Schultze who will send Dr. Alfonse Spruce a message requesting prescription be sent to [facility] under IM program.  Informed patient of her appointment tomorrow (10:00 am) at [facility].  Transportation requested.  Per patient request took her different strength reading glasses and she chose +1.25 to wear under her goggles at work. Jake Michaelis RN, Congregational Nurse [phone] ?

## 2021-09-27 NOTE — Progress Notes (Signed)
[place] ?OFFICE PROGRESS NOTE ? ?Gaylan Gerold, DO ?[address] ?[place] [address] ? ?DIAGNOSIS:  f/u of rectal cancer ? ?Oncology History Overview Note  ?Cancer Staging ?Rectal adenocarcinoma ([place]) ?Staging form: Colon and Rectum, AJCC 8th Edition ?- Clinical stage from 12/21/2020: Stage IIIB (cT3, cN1, cM0) - Unsigned ? ?  ?Rectal adenocarcinoma ([place])  ?08/26/2020 Miscellaneous  ? Initial presentation to PCP, reporting intermittent BRBPR since 02/2020  ?  ?12/01/2020 Procedure  ? Colonoscopy by Dr. Silverio Decamp findings ?- The perianal and digital rectal examinations were normal. ?- A 15 mm polyp was found in the ascending colon. The polyp was semi-pedunculated. Resection and retrieval were complete. ?- A 25 mm polyp was found in the sigmoid colon. The polyp was pedunculated. Resection and retrieval were complete. ?- An infiltrative partially obstructing large mass was found in the proximal rectum. The mass was partially circumferential (involving one-half of the lumen circumference). The mass measured eight cm in length extending from 10 to 18cm from anal verge. This was biopsied with a cold forceps for histology. Proximal and distal opposite fold area of the mass lesion was tattooed with an injection of total 3 mL of Spot (carbon black). ?  ?12/01/2020 Initial Biopsy  ? Diagnosis ?1. Colon, polyp(s), ascending x 1 ?- ADENOCARCINOMA ARISING IN A TUBULAR ADENOMA WITH HIGH-GRADE DYSPLASIA. SEE NOTE ?2. Colon, sigmoid polyp, x1 ?- TUBULOVILLOUS ADENOMA(S) ?- NEGATIVE FOR HIGH-GRADE DYSPLASIA OR MALIGNANCY ?3. Rectum, biopsy ?- ADENOCARCINOMA. SEE NOTE ?  ?12/01/2020 Cancer Staging  ? Cancer Staging ?Rectal adenocarcinoma ([place]) ?Staging form: Colon and Rectum, AJCC 8th Edition ?- Clinical stage from 12/21/2020: Stage IIIB (cT3, cN1, cM0) - Unsigned ? ?  ?12/06/2020 Imaging  ? CT CAP with contrast IMPRESSION: ?1. There is partially circumferential soft tissue thickening of the ?mid to superior rectum,  approximately 5 cm in length and the ?inferior extent approximately 6 cm above the anal verge, consistent ?with primary rectal malignancy identified by colonoscopy. ?2. There appear to be abnormally enlarged perirectal lymph nodes ?posteriorly about the superior rectum measuring up to 1.0 x 0.8 cm. ?Findings are suspicious for perirectal nodal metastatic disease, ?however rectal MRI is the test of choice for initial local staging ?of rectal cancer. ?3. There is a 4 mm nonspecific pulmonary nodule of the superior ?segment left lower lobe, statistically most likely incidental, ?infectious or inflammatory, although nonspecific and isolated ?metastatic disease is not strictly excluded. Attention on follow-up. ?4. No other evidence of metastatic disease in the chest, abdomen, or ?pelvis. ?Aortic Atherosclerosis (ICD10-I70.0). ?  ?12/09/2020 Imaging  ? Local staging MRI pelvis without contrast IMPRESSION: ?4.9 cm circumferential mid/lower rectal mass, corresponding to the ?patient's newly diagnosed rectal cancer. ?Rectal adenocarcinoma T stage: T3c ?Rectal adenocarcinoma N stage:  N1 ?Distance from tumor to the internal anal sphincter is 3.7 cm. ?  ?12/21/2020 Initial Diagnosis  ? Rectal adenocarcinoma ([place]) ?  ?01/04/2021 -  Chemotherapy  ? Concurrent chemoradiation with Xeloda '1000mg'$  in the AM and '1500mg'$  in the PM on days of Radiation starting 01/04/21.  ?  ? ?  ?01/04/2021 - 02/11/2021 Radiation Therapy  ? Concurrent chemoradiation with Dr Lisbeth Renshaw and Xeloda starting 01/04/21.  ?  ? ? ?CURRENT THERAPY: Surveillance ? ?INTERVAL HISTORY: ?Akane Glanz 71 y.o. female returns to the clinic today for a follow-up visit accompanied by her interpretor.  The patient was last seen by Dr. Burr Medico on 03/07/2021.  At that point in time, the patient had completed concurrent chemoradiation with Xeloda.  Dr.  Feng recommended repeat sigmoidoscopy to evaluate treatment response.  Of note, the patient previously declined IV chemotherapy and surgery  despite repeated discussions and strong recommendations for this.  The patient declined repeat sigmoidoscopy.  She also did not want any follow-up scans or procedures and want to follow-up clinically as she develops new or worsening symptoms. ? ?When asked today, she states she would be open to possible scans and/or colonoscopies if needed to evaluate her new bowel concerns.  She is still hesitant about surgery.  She is primarily concerned about not being able to work if she undergoes surgery. ? ?The patient recently reported worsening pencil thin stools/decreased caliber of stools for 2 months. She brought her financial assistance application paperwork with her.  I will scanned this into the chart.  It states that the patient is eligible for 100% discount under the [facility] health professional fee billing financial assistance, valid from 08/24/21-02/21/22. She denies any rectal pain or bleeding.  She denies any melena or hematochezia.  Denies any fever, chills, night sweats, or unexplained weight loss. Denies any appetite changes.  Denies any nausea, vomiting, diarrhea, or constipation.  Denies any abdominal pain.  Denies any cough or shortness of breath.  Denies any dysphagia or odynophagia.  Denies any back pain.  Denies any jaundice or abdominal distention.  She is here today for evaluation regarding her new symptoms. ? ?MEDICAL HISTORY: ?Past Medical History:  ?Diagnosis Date  ? Bilateral wrist pain 10/25/2017  ? Diabetes mellitus without complication ([place])   ? Hypertension   ? Neck mass 1998  ? Unknown biopsy results. Excised in [place].  ? Trigger finger, acquired 02/08/2017  ? ? ?ALLERGIES:  has No Known Allergies. ? ?MEDICATIONS:  ?Current Outpatient Medications  ?Medication Sig Dispense Refill  ? amLODipine (NORVASC) 5 MG tablet Take 1 tablet (5 mg total) by mouth daily. 90 tablet 3  ? gabapentin (NEURONTIN) 300 MG capsule Take 1 capsule (300 mg total) by mouth 3 (three) times daily. Take 1 pill at bedtime for 1  week, and then slowly increase to 3 times a day as needed. 90 capsule 2  ? lisinopril-hydrochlorothiazide (ZESTORETIC) 20-12.5 MG tablet Take 2 tablets by mouth daily. 60 tablet 3  ? metFORMIN (GLUCOPHAGE-XR) 500 MG 24 hr tablet Take 1 tablet (500 mg total) by mouth daily with breakfast. 30 tablet 5  ? ?No current facility-administered medications for this visit.  ? ? ?SURGICAL HISTORY:  ?Past Surgical History:  ?Procedure Laterality Date  ? NECK SURGERY Left 1998  ? Excision of mass in [place]  ? ? ?REVIEW OF SYSTEMS:   ?Review of Systems  ?Constitutional: Negative for appetite change, chills, fatigue, fever and unexpected weight change.  ?HENT:   Negative for mouth sores, nosebleeds, sore throat and trouble swallowing.   ?Eyes: Negative for eye problems and icterus.  ?Respiratory: Negative for cough, hemoptysis, shortness of breath and wheezing.   ?Cardiovascular: Negative for chest pain and leg swelling.  ?Gastrointestinal: Positive for pencil thin stools and decreased caliber stools.  Negative for abdominal pain, constipation, diarrhea, nausea and vomiting.  ?Genitourinary: Negative for bladder incontinence, difficulty urinating, dysuria, frequency and hematuria.   ?Musculoskeletal: Negative for back pain, gait problem, neck pain and neck stiffness.  ?Skin: Negative for itching and rash.  ?Neurological: Negative for dizziness, extremity weakness, gait problem, headaches, light-headedness and seizures.  ?Hematological: Negative for adenopathy. Does not bruise/bleed easily.  ?Psychiatric/Behavioral: Negative for confusion, depression and sleep disturbance. The patient is not nervous/anxious.   ? ? ?PHYSICAL  EXAMINATION:  ?Blood pressure (!) 150/85, pulse 82, temperature 98 ?F (36.7 ?C), temperature source Temporal, resp. rate 16, height '4\' 10"'$  (1.473 m), weight 120 lb 8 oz (54.7 kg), SpO2 100 %. ? ?ECOG PERFORMANCE STATUS: 1 ? ?Physical Exam  ?Constitutional: Oriented to person, place, and time and well-developed,  well-nourished, and in no distress. ?HENT:  ?Head: Normocephalic and atraumatic.  ?Mouth/Throat: Oropharynx is clear and moist. No oropharyngeal exudate.  ?Eyes: Conjunctivae are normal. Right eye exhibits no disch

## 2021-09-28 ENCOUNTER — Inpatient Hospital Stay: Payer: Self-pay | Attending: Physician Assistant | Admitting: Physician Assistant

## 2021-09-28 ENCOUNTER — Inpatient Hospital Stay: Payer: Self-pay

## 2021-09-28 ENCOUNTER — Other Ambulatory Visit: Payer: Self-pay

## 2021-09-28 VITALS — BP 150/85 | HR 82 | Temp 98.0°F | Resp 16 | Ht <= 58 in | Wt 120.5 lb

## 2021-09-28 DIAGNOSIS — R195 Other fecal abnormalities: Secondary | ICD-10-CM

## 2021-09-28 DIAGNOSIS — I10 Essential (primary) hypertension: Secondary | ICD-10-CM | POA: Insufficient documentation

## 2021-09-28 DIAGNOSIS — D509 Iron deficiency anemia, unspecified: Secondary | ICD-10-CM | POA: Insufficient documentation

## 2021-09-28 DIAGNOSIS — C20 Malignant neoplasm of rectum: Secondary | ICD-10-CM

## 2021-09-28 DIAGNOSIS — E1140 Type 2 diabetes mellitus with diabetic neuropathy, unspecified: Secondary | ICD-10-CM | POA: Insufficient documentation

## 2021-09-28 LAB — CBC WITH DIFFERENTIAL (CANCER CENTER ONLY)
Abs Immature Granulocytes: 0.02 10*3/uL (ref 0.00–0.07)
Basophils Absolute: 0 10*3/uL (ref 0.0–0.1)
Basophils Relative: 1 %
Eosinophils Absolute: 0.2 10*3/uL (ref 0.0–0.5)
Eosinophils Relative: 3 %
HCT: 33 % — ABNORMAL LOW (ref 36.0–46.0)
Hemoglobin: 11 g/dL — ABNORMAL LOW (ref 12.0–15.0)
Immature Granulocytes: 0 %
Lymphocytes Relative: 15 %
Lymphs Abs: 0.8 10*3/uL (ref 0.7–4.0)
MCH: 26.1 pg (ref 26.0–34.0)
MCHC: 33.3 g/dL (ref 30.0–36.0)
MCV: 78.2 fL — ABNORMAL LOW (ref 80.0–100.0)
Monocytes Absolute: 0.4 10*3/uL (ref 0.1–1.0)
Monocytes Relative: 8 %
Neutro Abs: 3.9 10*3/uL (ref 1.7–7.7)
Neutrophils Relative %: 73 %
Platelet Count: 227 10*3/uL (ref 150–400)
RBC: 4.22 MIL/uL (ref 3.87–5.11)
RDW: 15.4 % (ref 11.5–15.5)
WBC Count: 5.3 10*3/uL (ref 4.0–10.5)
nRBC: 0 % (ref 0.0–0.2)

## 2021-09-28 LAB — CMP (CANCER CENTER ONLY)
ALT: 5 U/L (ref 0–44)
AST: 13 U/L — ABNORMAL LOW (ref 15–41)
Albumin: 3.8 g/dL (ref 3.5–5.0)
Alkaline Phosphatase: 44 U/L (ref 38–126)
Anion gap: 4 — ABNORMAL LOW (ref 5–15)
BUN: 17 mg/dL (ref 8–23)
CO2: 30 mmol/L (ref 22–32)
Calcium: 9.2 mg/dL (ref 8.9–10.3)
Chloride: 107 mmol/L (ref 98–111)
Creatinine: 0.75 mg/dL (ref 0.44–1.00)
GFR, Estimated: >60 mL/min (ref >60)
Glucose, Bld: 129 mg/dL — ABNORMAL HIGH (ref 70–99)
Potassium: 3.7 mmol/L (ref 3.5–5.1)
Sodium: 141 mmol/L (ref 135–145)
Total Bilirubin: 0.3 mg/dL (ref 0.3–1.2)
Total Protein: 6.9 g/dL (ref 6.5–8.1)

## 2021-09-28 LAB — IRON AND IRON BINDING CAPACITY (CC-WL,HP ONLY)
Iron: 48 ug/dL (ref 28–170)
Saturation Ratios: 17 % (ref 10.4–31.8)
TIBC: 290 ug/dL (ref 250–450)
UIBC: 242 ug/dL (ref 148–442)

## 2021-09-28 LAB — CEA (IN HOUSE-CHCC): CEA (CHCC-In House): 18.49 ng/mL — ABNORMAL HIGH (ref 0.00–5.00)

## 2021-09-28 LAB — FERRITIN: Ferritin: 133 ng/mL (ref 11–307)

## 2021-09-28 NOTE — Congregational Nurse Program (Signed)
Home visit to deliver Gabapentin which was picked up from [facility].  Used interpreter [employer] via phone to explain instructions.  Jake Michaelis RN, Congregational Nurse [phone] ?

## 2021-10-05 NOTE — Congregational Nurse Program (Signed)
Home visit with interpreter [name]. Reviewed instructions for C-T scan scheduled for 10/07/2021.  Patient has contrast and expressed understanding of instructions.  Transportation scheduled.  States she is feeling lethargic and dizzy. No other symptoms.  BP 99/65.  States she ate lunch.  Encouraged to drink water and tea and some fruit.  Patient will recheck BP tomorrow and report to CN.  Completed application for [employer].  Jake Michaelis RN, Congregational Nurse [phone] ?

## 2021-10-07 ENCOUNTER — Other Ambulatory Visit: Payer: Self-pay

## 2021-10-07 ENCOUNTER — Ambulatory Visit (HOSPITAL_COMMUNITY)
Admission: RE | Admit: 2021-10-07 | Discharge: 2021-10-07 | Disposition: A | Discharge status: Home / Self Care | Payer: Self-pay | Source: Ambulatory Visit | Attending: Physician Assistant | Admitting: Physician Assistant

## 2021-10-07 DIAGNOSIS — C20 Malignant neoplasm of rectum: Secondary | ICD-10-CM | POA: Insufficient documentation

## 2021-10-07 MED ORDER — SODIUM CHLORIDE (PF) 0.9 % IJ SOLN
INTRAMUSCULAR | Status: AC
  Filled 2021-10-07: qty 50

## 2021-10-07 MED ORDER — IOHEXOL 300 MG/ML  SOLN
80.0000 mL | Freq: Once | INTRAMUSCULAR | Status: AC | PRN
  Administered 2021-10-07: 80 mL via INTRAVENOUS

## 2021-10-10 NOTE — Progress Notes (Signed)
?[place]   ?Telephone:(336) [phone] [phone]   ?Clinic Follow up Note  ? ?Patient Care Team: ?Gaylan Gerold, DO as PCP - General (Internal Medicine) ?Jonnie Finner, RN (Inactive) as Oncology Nurse Navigator ?Alla Feeling, NP as Nurse Practitioner (Nurse Practitioner) ?[name], MD as Consulting Physician (Hematology and Oncology) ?Kyung Rudd, MD as Consulting Physician (Radiation Oncology) ?10/11/2021 ? ?CHIEF COMPLAINT: Follow up changing stool calibre, review CT AP ? ?SUMMARY OF ONCOLOGIC HISTORY: ?Oncology History Overview Note  ?Cancer Staging ?Rectal adenocarcinoma ([place]) ?Staging form: Colon and Rectum, AJCC 8th Edition ?- Clinical stage from 12/21/2020: Stage IIIB (cT3, cN1, cM0) - Unsigned ? ?  ?Rectal adenocarcinoma ([place])  ?08/26/2020 Miscellaneous  ? Initial presentation to PCP, reporting intermittent BRBPR since 02/2020  ?  ?12/01/2020 Procedure  ? Colonoscopy by Dr. Silverio Decamp findings ?- The perianal and digital rectal examinations were normal. ?- A 15 mm polyp was found in the ascending colon. The polyp was semi-pedunculated. Resection and retrieval were complete. ?- A 25 mm polyp was found in the sigmoid colon. The polyp was pedunculated. Resection and retrieval were complete. ?- An infiltrative partially obstructing large mass was found in the proximal rectum. The mass was partially circumferential (involving one-half of the lumen circumference). The mass measured eight cm in length extending from 10 to 18cm from anal verge. This was biopsied with a cold forceps for histology. Proximal and distal opposite fold area of the mass lesion was tattooed with an injection of total 3 mL of Spot (carbon black). ?  ?12/01/2020 Initial Biopsy  ? Diagnosis ?1. Colon, polyp(s), ascending x 1 ?- ADENOCARCINOMA ARISING IN A TUBULAR ADENOMA WITH HIGH-GRADE DYSPLASIA. SEE NOTE ?2. Colon, sigmoid polyp, x1 ?- TUBULOVILLOUS ADENOMA(S) ?- NEGATIVE FOR HIGH-GRADE DYSPLASIA OR MALIGNANCY ?3.  Rectum, biopsy ?- ADENOCARCINOMA. SEE NOTE ?  ?12/01/2020 Cancer Staging  ? Cancer Staging ?Rectal adenocarcinoma ([place]) ?Staging form: Colon and Rectum, AJCC 8th Edition ?- Clinical stage from 12/21/2020: Stage IIIB (cT3, cN1, cM0) - Unsigned ? ?  ?12/06/2020 Imaging  ? CT CAP with contrast IMPRESSION: ?1. There is partially circumferential soft tissue thickening of the ?mid to superior rectum, approximately 5 cm in length and the ?inferior extent approximately 6 cm above the anal verge, consistent ?with primary rectal malignancy identified by colonoscopy. ?2. There appear to be abnormally enlarged perirectal lymph nodes ?posteriorly about the superior rectum measuring up to 1.0 x 0.8 cm. ?Findings are suspicious for perirectal nodal metastatic disease, ?however rectal MRI is the test of choice for initial local staging ?of rectal cancer. ?3. There is a 4 mm nonspecific pulmonary nodule of the superior ?segment left lower lobe, statistically most likely incidental, ?infectious or inflammatory, although nonspecific and isolated ?metastatic disease is not strictly excluded. Attention on follow-up. ?4. No other evidence of metastatic disease in the chest, abdomen, or ?pelvis. ?Aortic Atherosclerosis (ICD10-I70.0). ?  ?12/09/2020 Imaging  ? Local staging MRI pelvis without contrast IMPRESSION: ?4.9 cm circumferential mid/lower rectal mass, corresponding to the ?patient's newly diagnosed rectal cancer. ?Rectal adenocarcinoma T stage: T3c ?Rectal adenocarcinoma N stage:  N1 ?Distance from tumor to the internal anal sphincter is 3.7 cm. ?  ?12/21/2020 Initial Diagnosis  ? Rectal adenocarcinoma ([place]) ?  ?01/04/2021 -  Chemotherapy  ? Concurrent chemoradiation with Xeloda '1000mg'$  in the AM and '1500mg'$  in the PM on days of Radiation starting 01/04/21.  ?  ? ?  ?01/04/2021 - 02/11/2021 Radiation Therapy  ? Concurrent chemoradiation with Dr Lisbeth Renshaw and Xeloda starting 01/04/21.  ?  ? ? ?  CURRENT THERAPY: currently on observation ? ?INTERVAL  HISTORY: Ms. Nikkel returns with Congregational nurse [name] and interpreter [facility] for follow up as scheduled. Last seen by me 02/07/21 while on chemoRT which she completed on 02/11/21. She saw Dr. Burr Medico 03/07/21 to begin surveillance. She developed change in bowel habits with new small calibre stool and was seen in our clinic on 09/28/2021, a CT CAP was done on 10/07/2021. ? ?Today she reports her stools have returned to normal.  Denies constipation, hematochezia/melena, nausea or vomiting.  She has low appetite and is requesting medication to help.  She also has difficulty sleeping.  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  She is tired after a long night at work but otherwise feels strong.  She has noticed intermittent coughing fits that can last up to 15 minutes.  Denies chest pain or dyspnea.  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 ? ?All other systems were reviewed with the patient and are negative. ? ?MEDICAL HISTORY:  ?Past Medical History:  ?Diagnosis Date  ? Bilateral wrist pain 10/25/2017  ? Diabetes mellitus without complication ([place])   ? Hypertension   ? Neck mass 1998  ? Unknown biopsy results. Excised in [place].  ? Trigger finger, acquired 02/08/2017  ? ? ?SURGICAL HISTORY: ?Past Surgical History:  ?Procedure Laterality Date  ? NECK SURGERY Left 1998  ? Excision of mass in [place]  ? ? ?I have reviewed the social history and family history with the patient and they are unchanged from previous note. ? ?ALLERGIES:  has No Known Allergies. ? ?MEDICATIONS:  ?Current Outpatient Medications  ?Medication Sig Dispense Refill  ? mirtazapine (REMERON) 7.5 MG tablet Take 1 tablet (7.5 mg total) by mouth at bedtime. 30 tablet 1  ? amLODipine (NORVASC) 5 MG tablet Take 1 tablet (5 mg total) by mouth daily. 90 tablet 3  ? gabapentin (NEURONTIN) 300 MG capsule Take 1 capsule (300 mg total) by mouth 3 (three) times  daily. Take 1 pill at bedtime for 1 week, and then slowly increase to 3 times a day as needed. 90 capsule 2  ? lisinopril-hydrochlorothiazide (ZESTORETIC) 20-12.5 MG tablet Take 2 tablets by mouth daily. 60 tablet 3  ? metFORMIN (GLUCOPHAGE-XR) 500 MG 24 hr tablet Take 1 tablet (500 mg total) by mouth daily with breakfast. 30 tablet 5  ? ?No current facility-administered medications for this visit.  ? ? ?PHYSICAL EXAMINATION: ?ECOG PERFORMANCE STATUS: 1 - Symptomatic but completely ambulatory ? ?Vitals:  ? 10/11/21 1147  ?BP: (!) 146/80  ?Pulse: 60  ?Resp: 18  ?Temp: 98.4 ?F (36.9 ?C)  ?SpO2: 97%  ? ?Filed Weights  ? 10/11/21 1147  ?Weight: 120 lb 4.8 oz (54.6 kg)  ? ? ?GENERAL:alert, no distress and comfortable ?SKIN: No rash ?EYES: sclera clear ?NECK: Doubt mass ?LYMPH:  no palpable cervical or supraclavicular lymphadenopathy  ?LUNGS: clear with normal breathing effort ?HEART: regular rate & rhythm, no lower extremity edema ?ABDOMEN:abdomen soft, non-tender and normal bowel sounds ?NEURO: alert & oriented x 3 with fluent speech, no focal motor deficits ?Rectal exam deferred ? ?LABORATORY DATA:  ?I have reviewed the data as listed ? ?  Latest Ref Rng & Units 09/28/2021  ? 11:00 AM 03/07/2021  ? 10:41 AM 02/07/2021  ? 12:08 PM  ?CBC  ?WBC 4.0 - 10.5 K/uL 5.3   4.5   4.0    ?Hemoglobin 12.0 -  15.0 g/dL 11.0   11.0   11.1    ?Hematocrit 36.0 - 46.0 % 33.0   34.2   33.8    ?Platelets 150 - 400 K/uL 227   191   182    ? ? ? ? ?  Latest Ref Rng & Units 09/28/2021  ? 11:00 AM 03/07/2021  ? 10:41 AM 02/07/2021  ? 12:08 PM  ?CMP  ?Glucose 70 - 99 mg/dL 129   142   87    ?BUN 8 - 23 mg/dL '17   12   15    '$ ?Creatinine 0.44 - 1.00 mg/dL 0.75   0.82   0.77    ?Sodium 135 - 145 mmol/L 141   140   141    ?Potassium 3.5 - 5.1 mmol/L 3.7   4.0   3.9    ?Chloride 98 - 111 mmol/L 107   106   107    ?CO2 22 - 32 mmol/L '30   25   26    '$ ?Calcium 8.9 - 10.3 mg/dL 9.2   8.8   9.0    ?Total Protein 6.5 - 8.1 g/dL 6.9   6.8   7.0    ?Total  Bilirubin 0.3 - 1.2 mg/dL 0.3   0.7   0.7    ?Alkaline Phos 38 - 126 U/L 44   50   45    ?AST 15 - 41 U/L '13   15   15    '$ ?ALT 0 - 44 U/L 5   7   <6    ? ? ? ? ?RADIOGRAPHIC STUDIES: ?I have personally reviewed the r

## 2021-10-11 ENCOUNTER — Other Ambulatory Visit: Payer: Self-pay

## 2021-10-11 ENCOUNTER — Telehealth: Payer: Self-pay | Admitting: *Deleted

## 2021-10-11 ENCOUNTER — Encounter: Payer: Self-pay | Admitting: Nurse Practitioner

## 2021-10-11 ENCOUNTER — Other Ambulatory Visit (HOSPITAL_COMMUNITY): Payer: Self-pay

## 2021-10-11 ENCOUNTER — Inpatient Hospital Stay (HOSPITAL_BASED_OUTPATIENT_CLINIC_OR_DEPARTMENT_OTHER): Payer: Self-pay | Admitting: Nurse Practitioner

## 2021-10-11 ENCOUNTER — Other Ambulatory Visit: Payer: Self-pay | Admitting: Internal Medicine

## 2021-10-11 VITALS — BP 146/80 | HR 60 | Temp 98.4°F | Resp 18 | Ht <= 58 in | Wt 120.3 lb

## 2021-10-11 DIAGNOSIS — R918 Other nonspecific abnormal finding of lung field: Secondary | ICD-10-CM

## 2021-10-11 DIAGNOSIS — C20 Malignant neoplasm of rectum: Secondary | ICD-10-CM

## 2021-10-11 MED ORDER — MIRTAZAPINE 7.5 MG PO TABS
7.5000 mg | ORAL_TABLET | Freq: Every day | ORAL | 2 refills | Status: DC
  Filled 2021-10-11: qty 30, 30d supply, fill #0

## 2021-10-11 MED ORDER — MIRTAZAPINE 7.5 MG PO TABS
7.5000 mg | ORAL_TABLET | Freq: Every day | ORAL | 1 refills | Status: DC
  Filled 2021-10-11: qty 30, 30d supply, fill #0

## 2021-10-11 NOTE — Telephone Encounter (Signed)
Notified Jake Michaelis, Post RN that Rx has been sent under IM Program. She is very appreciative. She will p/u med tomorrow and take to patient. ?

## 2021-10-11 NOTE — Telephone Encounter (Signed)
Received call from Jake Michaelis, Echo. States patient received Rx for Remeron today at [facility] but she cannot afford it. She is requesting Rx be resent to [facility] under IM Program so patient may receive at $4. ?

## 2021-10-12 NOTE — Congregational Nurse Program (Signed)
Home visit with interpreter [name] to take Remeron which was picked up from [facility] out-patient pharmacy.  Reviewed instructions with patient.  Expressed understanding and will call CN with questions or concerns.  Jake Michaelis RN, Congregational Nurse [phone]. ?

## 2021-10-13 ENCOUNTER — Other Ambulatory Visit: Payer: Self-pay | Admitting: Nurse Practitioner

## 2021-10-13 ENCOUNTER — Telehealth: Payer: Self-pay | Admitting: Hematology

## 2021-10-13 NOTE — Telephone Encounter (Signed)
Scheduled follow-up appointment per 3/28 los. Patient's interpreter is aware. ?

## 2021-10-20 ENCOUNTER — Encounter: Payer: Self-pay | Admitting: Pulmonary Disease

## 2021-10-20 ENCOUNTER — Ambulatory Visit (INDEPENDENT_AMBULATORY_CARE_PROVIDER_SITE_OTHER): Payer: Self-pay | Admitting: Pulmonary Disease

## 2021-10-20 VITALS — BP 108/74 | HR 64 | Temp 98.1°F | Ht 59.0 in | Wt 116.4 lb

## 2021-10-20 DIAGNOSIS — R911 Solitary pulmonary nodule: Secondary | ICD-10-CM | POA: Insufficient documentation

## 2021-10-20 DIAGNOSIS — C20 Malignant neoplasm of rectum: Secondary | ICD-10-CM

## 2021-10-20 NOTE — Patient Instructions (Signed)
Thank you for visiting Dr. Valeta Harms at [facility]. ?Today we recommend the following: ? ?Orders Placed This Encounter  ?Procedures  ? Procedural/ Surgical Case Request: ROBOTIC ASSISTED NAVIGATIONAL BRONCHOSCOPY  ? CT Super D Chest Wo Contrast  ? Ambulatory referral to Pulmonology  ? ?Bronchoscopy on 11/01/2021 ? ?Return in about 19 days (around 11/08/2021) for with Eric Form, NP. ? ? ? ?Please do your part to reduce the spread of COVID-19.  ? ?

## 2021-10-20 NOTE — Progress Notes (Signed)
? ?Synopsis: Referred in April 2023 for lung nodule by Alla Feeling, NP ? ?Subjective:  ? ?PATIENT ID: Jillian Lutz GENDER: female DOB: [birth year], MRN: [id number] ? ?Chief Complaint  ?Patient presents with  ? Consult  ?  Consult. Patient is here to talk about mass on left lung.   ? ? ?This is a 71 year old female, past medical history of stage III rectal adenocarcinoma.  She had ongoing surveillance imaging with medical oncology.  Patient was found to have a new left lower lobe lung nodule.  Patient was referred here for evaluation of the lung nodule and consideration for bioCT abdomen and pelvis completed on 10/07/2021 revealed a interval enlargement in the left lower lobe concerning for metastasis to the lung.psy.  From a respiratory standpoint she is doing okay.  She is present today in the office with interpreter. ? ? ?Oncology History Overview Note  ?Cancer Staging ?Rectal adenocarcinoma ([place]) ?Staging form: Colon and Rectum, AJCC 8th Edition ?- Clinical stage from 12/21/2020: Stage IIIB (cT3, cN1, cM0) - Unsigned ? ?  ?Rectal adenocarcinoma ([place])  ?08/26/2020 Miscellaneous  ? Initial presentation to PCP, reporting intermittent BRBPR since 02/2020  ?  ?12/01/2020 Procedure  ? Colonoscopy by Dr. Silverio Decamp findings ?- The perianal and digital rectal examinations were normal. ?- A 15 mm polyp was found in the ascending colon. The polyp was semi-pedunculated. Resection and retrieval were complete. ?- A 25 mm polyp was found in the sigmoid colon. The polyp was pedunculated. Resection and retrieval were complete. ?- An infiltrative partially obstructing large mass was found in the proximal rectum. The mass was partially circumferential (involving one-half of the lumen circumference). The mass measured eight cm in length extending from 10 to 18cm from anal verge. This was biopsied with a cold forceps for histology. Proximal and distal opposite fold area of the mass lesion was tattooed with an injection of total 3 mL of  Spot (carbon black). ?  ?12/01/2020 Initial Biopsy  ? Diagnosis ?1. Colon, polyp(s), ascending x 1 ?- ADENOCARCINOMA ARISING IN A TUBULAR ADENOMA WITH HIGH-GRADE DYSPLASIA. SEE NOTE ?2. Colon, sigmoid polyp, x1 ?- TUBULOVILLOUS ADENOMA(S) ?- NEGATIVE FOR HIGH-GRADE DYSPLASIA OR MALIGNANCY ?3. Rectum, biopsy ?- ADENOCARCINOMA. SEE NOTE ?  ?12/01/2020 Cancer Staging  ? Cancer Staging ?Rectal adenocarcinoma ([place]) ?Staging form: Colon and Rectum, AJCC 8th Edition ?- Clinical stage from 12/21/2020: Stage IIIB (cT3, cN1, cM0) - Unsigned ? ?  ?12/06/2020 Imaging  ? CT CAP with contrast IMPRESSION: ?1. There is partially circumferential soft tissue thickening of the ?mid to superior rectum, approximately 5 cm in length and the ?inferior extent approximately 6 cm above the anal verge, consistent ?with primary rectal malignancy identified by colonoscopy. ?2. There appear to be abnormally enlarged perirectal lymph nodes ?posteriorly about the superior rectum measuring up to 1.0 x 0.8 cm. ?Findings are suspicious for perirectal nodal metastatic disease, ?however rectal MRI is the test of choice for initial local staging ?of rectal cancer. ?3. There is a 4 mm nonspecific pulmonary nodule of the superior ?segment left lower lobe, statistically most likely incidental, ?infectious or inflammatory, although nonspecific and isolated ?metastatic disease is not strictly excluded. Attention on follow-up. ?4. No other evidence of metastatic disease in the chest, abdomen, or ?pelvis. ?Aortic Atherosclerosis (ICD10-I70.0). ?  ?12/09/2020 Imaging  ? Local staging MRI pelvis without contrast IMPRESSION: ?4.9 cm circumferential mid/lower rectal mass, corresponding to the ?patient's newly diagnosed rectal cancer. ?Rectal adenocarcinoma T stage: T3c ?Rectal adenocarcinoma N stage:  N1 ?Distance from tumor to  the internal anal sphincter is 3.7 cm. ?  ?12/21/2020 Initial Diagnosis  ? Rectal adenocarcinoma ([place]) ?  ?01/04/2021 -  Chemotherapy  ? Concurrent  chemoradiation with Xeloda '1000mg'$  in the AM and '1500mg'$  in the PM on days of Radiation starting 01/04/21.  ?  ? ?  ?01/04/2021 - 02/11/2021 Radiation Therapy  ? Concurrent chemoradiation with Dr Lisbeth Renshaw and Xeloda starting 01/04/21.  ?  ? ? ? ?Past Medical History:  ?Diagnosis Date  ? Bilateral wrist pain 10/25/2017  ? Diabetes mellitus without complication ([place])   ? Hypertension   ? Neck mass 1998  ? Unknown biopsy results. Excised in [place].  ? Trigger finger, acquired 02/08/2017  ?  ? ?Family History  ?Problem Relation Age of Onset  ? Headache Son   ?  ? ?Past Surgical History:  ?Procedure Laterality Date  ? NECK SURGERY Left 1998  ? Excision of mass in [place]  ? ? ?Social History  ? ?Socioeconomic History  ? Marital status: Married  ?  Spouse name: Floyce Stakes  ? Number of children: Not on file  ? Years of education: Not on file  ? Highest education level: Not on file  ?Occupational History  ? Not on file  ?Tobacco Use  ? Smoking status: Never  ? Smokeless tobacco: Never  ?Substance and Sexual Activity  ? Alcohol use: No  ? Drug use: No  ? Sexual activity: Not on file  ?Other Topics Concern  ? Not on file  ?Social History Narrative  ? Came to [place] from [place] in 2013.  ? Does not know much family history [place].  ? ?Social Determinants of Health  ? ?Financial Resource Strain: Not on file  ?Food Insecurity: Not on file  ?Transportation Needs: Not on file  ?Physical Activity: Not on file  ?Stress: Not on file  ?Social Connections: Not on file  ?Intimate Partner Violence: Not At Risk  ? Fear of Current or Ex-Partner: No  ? Emotionally Abused: No  ? Physically Abused: No  ? Sexually Abused: No  ?  ? ?No Known Allergies  ? ?Outpatient Medications Prior to Visit  ?Medication Sig Dispense Refill  ? amLODipine (NORVASC) 5 MG tablet Take 1 tablet (5 mg total) by mouth daily. 90 tablet 3  ? gabapentin (NEURONTIN) 300 MG capsule Take 1 capsule (300 mg total) by  mouth 3 (three) times daily. Take 1 pill at bedtime for 1 week, and then slowly increase to 3 times a day as needed. 90 capsule 2  ? lisinopril-hydrochlorothiazide (ZESTORETIC) 20-12.5 MG tablet Take 2 tablets by mouth daily. 60 tablet 3  ? metFORMIN (GLUCOPHAGE-XR) 500 MG 24 hr tablet Take 1 tablet (500 mg total) by mouth daily with breakfast. 30 tablet 5  ? mirtazapine (REMERON) 7.5 MG tablet Take 1 tablet (7.5 mg total) by mouth at bedtime. 60 tablet 2  ? ?No facility-administered medications prior to visit.  ? ? ?Review of Systems  ?Constitutional:  Negative for chills, fever, malaise/fatigue and weight loss.  ?HENT:  Negative for hearing loss, sore throat and tinnitus.   ?Eyes:  Negative for blurred vision and double vision.  ?Respiratory:  Negative for cough, hemoptysis, sputum production, shortness of breath, wheezing and stridor.   ?Cardiovascular:  Negative for chest pain, palpitations, orthopnea, leg swelling and PND.  ?Gastrointestinal:  Negative for abdominal pain, constipation, diarrhea, heartburn, nausea and vomiting.  ?Genitourinary:  Negative for dysuria, hematuria and urgency.  ?  Musculoskeletal:  Negative for joint pain and myalgias.  ?Skin:  Negative for itching and rash.  ?Neurological:  Negative for dizziness, tingling, weakness and headaches.  ?Endo/Heme/Allergies:  Negative for environmental allergies. Does not bruise/bleed easily.  ?Psychiatric/Behavioral:  Negative for depression. The patient is not nervous/anxious and does not have insomnia.   ?All other systems reviewed and are negative. ? ? ?Objective:  ?Physical Exam ?Vitals reviewed.  ?Constitutional:   ?   General: She is not in acute distress. ?   Appearance: She is well-developed.  ?HENT:  ?   Head: Normocephalic and atraumatic.  ?Eyes:  ?   General: No scleral icterus. ?   Conjunctiva/sclera: Conjunctivae normal.  ?   Pupils: Pupils are equal, round, and reactive to light.  ?Neck:  ?   Vascular: No JVD.  ?   Trachea: No tracheal  deviation.  ?Cardiovascular:  ?   Rate and Rhythm: Normal rate and regular rhythm.  ?   Heart sounds: Normal heart sounds. No murmur heard. ?Pulmonary:  ?   Effort: Pulmonary effort is normal. No tachypnea, a

## 2021-10-26 ENCOUNTER — Other Ambulatory Visit: Payer: Self-pay | Admitting: Student

## 2021-10-26 ENCOUNTER — Other Ambulatory Visit (HOSPITAL_COMMUNITY): Payer: Self-pay

## 2021-10-26 ENCOUNTER — Ambulatory Visit (INDEPENDENT_AMBULATORY_CARE_PROVIDER_SITE_OTHER)
Admission: RE | Admit: 2021-10-26 | Discharge: 2021-10-26 | Disposition: A | Discharge status: Home / Self Care | Payer: Self-pay | Source: Ambulatory Visit | Attending: Pulmonary Disease | Admitting: Pulmonary Disease

## 2021-10-26 ENCOUNTER — Ambulatory Visit (INDEPENDENT_AMBULATORY_CARE_PROVIDER_SITE_OTHER): Payer: Self-pay | Admitting: Student

## 2021-10-26 ENCOUNTER — Encounter: Payer: Self-pay | Admitting: Student

## 2021-10-26 VITALS — BP 130/78 | HR 60 | Temp 97.8°F | Ht 59.0 in | Wt 117.5 lb

## 2021-10-26 DIAGNOSIS — G629 Polyneuropathy, unspecified: Secondary | ICD-10-CM

## 2021-10-26 DIAGNOSIS — C20 Malignant neoplasm of rectum: Secondary | ICD-10-CM

## 2021-10-26 DIAGNOSIS — R202 Paresthesia of skin: Secondary | ICD-10-CM

## 2021-10-26 DIAGNOSIS — Z0000 Encounter for general adult medical examination without abnormal findings: Secondary | ICD-10-CM

## 2021-10-26 DIAGNOSIS — Z658 Other specified problems related to psychosocial circumstances: Secondary | ICD-10-CM

## 2021-10-26 DIAGNOSIS — I10 Essential (primary) hypertension: Secondary | ICD-10-CM

## 2021-10-26 DIAGNOSIS — R911 Solitary pulmonary nodule: Secondary | ICD-10-CM

## 2021-10-26 DIAGNOSIS — R7303 Prediabetes: Secondary | ICD-10-CM

## 2021-10-26 MED ORDER — AMLODIPINE BESYLATE 5 MG PO TABS
5.0000 mg | ORAL_TABLET | Freq: Every day | ORAL | 3 refills | Status: DC
  Filled 2021-10-26: qty 30, 30d supply, fill #0
  Filled 2021-11-21: qty 30, 30d supply, fill #1
  Filled 2021-12-20: qty 30, 30d supply, fill #2
  Filled 2022-01-18: qty 30, 30d supply, fill #3
  Filled 2022-03-01: qty 30, 30d supply, fill #4
  Filled 2022-05-08: qty 30, 30d supply, fill #5
  Filled 2022-06-05: qty 30, 30d supply, fill #6
  Filled 2022-07-03: qty 30, 30d supply, fill #7
  Filled 2022-08-01: qty 30, 30d supply, fill #8
  Filled 2022-08-31: qty 30, 30d supply, fill #9
  Filled 2022-09-28: qty 30, 30d supply, fill #10

## 2021-10-26 MED ORDER — GABAPENTIN 100 MG PO CAPS
100.0000 mg | ORAL_CAPSULE | Freq: Three times a day (TID) | ORAL | 2 refills | Status: DC | PRN
  Filled 2021-10-26: qty 90, 30d supply, fill #0
  Filled 2021-11-21: qty 90, 30d supply, fill #1
  Filled 2021-12-20: qty 90, 30d supply, fill #2

## 2021-10-26 MED ORDER — LISINOPRIL-HYDROCHLOROTHIAZIDE 20-12.5 MG PO TABS
2.0000 | ORAL_TABLET | Freq: Every day | ORAL | 3 refills | Status: DC
  Filled 2021-10-26: qty 60, 30d supply, fill #0
  Filled 2021-11-21: qty 60, 30d supply, fill #1
  Filled 2021-12-20: qty 60, 30d supply, fill #2
  Filled 2022-01-18: qty 60, 30d supply, fill #3

## 2021-10-26 NOTE — Assessment & Plan Note (Addendum)
Stop metformin given A1c of 5.9 the setting of active cancer and low weights ?

## 2021-10-26 NOTE — Assessment & Plan Note (Addendum)
She was diagnosed with rectal adenocarcinoma in 12/2020 stage IIIb.  She did not have the mass resected.  She underwent chemotherapy and radiation.  Her CEA came down after therapy but it went back up to 18.4 at last visit, concerning for recurrent or metastatic disease. ? ?She reported tolerating p.o. well.  She has occasional abdominal pain but denies hematochezia or melena. ? ?-PET scan was ordered by her oncologist ?-She is scheduled for bronchoscopy for her enlarging left lower lobe nodule ?-Will stop mirtazapine.  She could not tolerate the sedating effects.  Her weight has been stable without mirtazapine ? ?

## 2021-10-26 NOTE — Assessment & Plan Note (Signed)
Planned bronchoscopy with biopsy on 11/01/2021 ?

## 2021-10-26 NOTE — Progress Notes (Signed)
? ?  CC: Follow-up on hypertension and peripheral neuropathy ? ?HPI: ? ?[name] is a 71 y.o. with past medical history of hypertension, rectal adenocarcinoma who presents to the clinic to follow-up on her hypertension and peripheral neuropathy. ? ?Please see problem based charting for detail ? ?Past Medical History:  ?Diagnosis Date  ? Bilateral wrist pain 10/25/2017  ? Diabetes mellitus without complication ([place])   ? Hypertension   ? Neck mass 1998  ? Unknown biopsy results. Excised in [place].  ? Trigger finger, acquired 02/08/2017  ? ?Review of Systems:  per HPI ? ?Physical Exam: ? ?Vitals:  ? 10/26/21 0930 10/26/21 0951  ?BP: (!) 143/72 130/78  ?Pulse: 62 60  ?Temp: 97.8 ?F (36.6 ?C)   ?TempSrc: Oral   ?SpO2: 100%   ?Weight: 117 lb 8 oz (53.3 kg)   ?Height: '4\' 11"'$  (1.499 m)   ? ?Physical Exam ?Constitutional:   ?   General: She is not in acute distress. ?   Appearance: Normal appearance.  ?HENT:  ?   Head: Normocephalic.  ?Eyes:  ?   General:     ?   Right eye: No discharge.     ?   Left eye: No discharge.  ?   Conjunctiva/sclera: Conjunctivae normal.  ?Cardiovascular:  ?   Rate and Rhythm: Normal rate and regular rhythm.  ?Pulmonary:  ?   Effort: Pulmonary effort is normal.  ?   Breath sounds: Normal breath sounds.  ?Abdominal:  ?   General: Bowel sounds are normal. There is no distension.  ?   Palpations: Abdomen is soft.  ?   Tenderness: There is no abdominal tenderness.  ?Musculoskeletal:     ?   General: Normal range of motion.  ?   Comments: Normal range of motion and strength of bilateral lower extremities. ?Left plantar fascia is tender to palpation and hypertonic  ?Skin: ?   General: Skin is warm.  ?Neurological:  ?   General: No focal deficit present.  ?   Mental Status: She is alert.  ?Psychiatric:     ?   Mood and Affect: Mood normal.  ?  ? ?Assessment & Plan:  ? ?See Encounters Tab for problem based charting. ? ?Patient discussed with Dr. Jimmye Norman  ?

## 2021-10-26 NOTE — Assessment & Plan Note (Addendum)
She is due for mammogram and a DEXA scan.  We will hold off on these until she finished with the PET scan and bronchoscopy. ?

## 2021-10-26 NOTE — Assessment & Plan Note (Signed)
This is a very difficult situation for her given she is the only working person in the household.  Her husband has dementia and cannot work.  All her children and relatives are in [place]. ? 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 ? ?-Light duty work note given  ? ?

## 2021-10-26 NOTE — Progress Notes (Addendum)
Internal Medicine Clinic Attending  Case discussed with Dr. Nguyen  at the time of the visit.  We reviewed the resident's history and exam and pertinent patient test results.  I agree with the assessment, diagnosis, and plan of care documented in the resident's note.  

## 2021-10-26 NOTE — Assessment & Plan Note (Signed)
She reports burning sensation and numbness bilateral lower extremities below the knees.  Pain is worse with standing for a long time.  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  Her symptoms started a few months after chemoradiation.  ? ?TSH, B12 and ferritin were within normal limits in the past.  Do not believe this is secondary to diabetes given A1c of 5.9. ? ?Patient could not tolerate gabapentin 300 mg tablet given sedating side effects. ? ?-Reduce gabapentin to 100 mg 3 times daily as needed ?

## 2021-10-26 NOTE — Patient Instructions (Addendum)
Ms. Mcauliff, ? ?It was a pleasure seeing you in clinic today. ? ?1.  Please continue taking your blood pressure medications. ? ?2.  Please stop taking metformin and mirtazapine ? ?3.  Your neuropathy is slightly due to chemotherapy and radiation.  I cut back gabapentin to 100 mg tablets. ? ?4.  Please follow-up with your oncologist and pulmonologist as scheduled ? ?Please return in 3 months, sooner if needed ? ?Take care ? ?Dr. Alfonse Spruce  ?

## 2021-10-26 NOTE — Assessment & Plan Note (Signed)
Blood pressure well controlled.  She report adherence to medications.  Last CMP a few weeks ago was unremarkable. ? ?-Continue amlodipine 5 mg ?-Continue lisinopril-HCTZ 20- 12.5 mg daily ?

## 2021-10-27 ENCOUNTER — Other Ambulatory Visit (HOSPITAL_COMMUNITY): Payer: Self-pay

## 2021-10-28 ENCOUNTER — Other Ambulatory Visit: Payer: Self-pay | Admitting: Pulmonary Disease

## 2021-10-28 ENCOUNTER — Encounter (HOSPITAL_COMMUNITY): Payer: Self-pay | Admitting: Pulmonary Disease

## 2021-10-28 ENCOUNTER — Other Ambulatory Visit: Payer: Self-pay

## 2021-10-28 LAB — SARS CORONAVIRUS 2 (TAT 6-24 HRS): SARS Coronavirus 2: NEGATIVE

## 2021-10-28 NOTE — Progress Notes (Signed)
PCP - Dr. Angelica Pou ?Cardiologist - denies ? ?PPM/ICD - denies ? ? ?Chest x-ray - 02/16/17 ?EKG - denies ?Stress Test - denies ?ECHO - denies ?Cardiac Cath - denies ? ?Sleep Study - denies ? ?DM- denies ? ?ASA/Blood Thinner Instructions: n/a ? ? ?ERAS Protcol - no, NPO ? ? ?COVID TEST- pt tested 4/14- negative ? ? ?Anesthesia review: no ? ?Patient denies shortness of breath, fever, cough and chest pain  ? ? ?All instructions explained to the patient, with a verbal understanding of the material. Patient agrees to go over the instructions while at home for a better understanding. Patient also instructed to notify surgeon of any contact with COVID+ person or if she develops any symptoms. The opportunity to ask questions was provided. ?  ?

## 2021-10-31 ENCOUNTER — Telehealth: Payer: Self-pay | Admitting: Pulmonary Disease

## 2021-10-31 NOTE — Telephone Encounter (Signed)
Spoke with Carloyn at [facility]  ?She states pt called this morning to cancel bronch for 11/01/21  ?She has GI issues and did not sound well on the phone  ?Sending to Dr Valeta Harms ?

## 2021-11-01 ENCOUNTER — Ambulatory Visit (HOSPITAL_COMMUNITY): Admission: RE | Admit: 2021-11-01 | Payer: Self-pay | Source: Home / Self Care | Admitting: Pulmonary Disease

## 2021-11-01 ENCOUNTER — Encounter (HOSPITAL_COMMUNITY): Payer: Self-pay | Admitting: Anesthesiology

## 2021-11-01 ENCOUNTER — Telehealth: Payer: Self-pay

## 2021-11-01 DIAGNOSIS — R911 Solitary pulmonary nodule: Secondary | ICD-10-CM | POA: Insufficient documentation

## 2021-11-01 SURGERY — BRONCHOSCOPY, WITH BIOPSY USING ELECTROMAGNETIC NAVIGATION
Anesthesia: General | Laterality: Left

## 2021-11-01 NOTE — Telephone Encounter (Signed)
Spoke with congregational nurse [name] about upcoming appointments for pt on 4/19 (PET scan) and follow up with Dr. Burr Medico 4/25, and pulm NP 4/28. [name] and pt are both aware and verbalized understanding.  ?

## 2021-11-01 NOTE — Anesthesia Preprocedure Evaluation (Deleted)
Anesthesia Evaluation  ? ? ?Reviewed: ?Allergy & Precautions, Patient's Chart, lab work & pertinent test results ? ?Airway ? ? ? ? ? ? ? Dental ?  ?Pulmonary ?neg pulmonary ROS,  ?  ? ? ? ? ? ? ? Cardiovascular ?hypertension, Pt. on medications ? ? ? ?  ?Neuro/Psych ?negative neurological ROS ? negative psych ROS  ? GI/Hepatic ?negative GI ROS, Neg liver ROS,   ?Endo/Other  ?negative endocrine ROS ? Renal/GU ?negative Renal ROS  ? ?H/o rectal CA ?negative genitourinary ?  ?Musculoskeletal ?negative musculoskeletal ROS ?(+)  ? Abdominal ?  ?Peds ? Hematology ?negative hematology ROS ?(+)   ?Anesthesia Other Findings ?LLL lung nodule ? Reproductive/Obstetrics ? ?  ? ? ? ? ? ? ? ? ? ? ? ? ? ?  ?  ? ? ? ? ? ? ? ? ?Anesthesia Physical ?Anesthesia Plan ? ?ASA: 2 ? ?Anesthesia Plan: General  ? ?Post-op Pain Management: Minimal or no pain anticipated  ? ?Induction: Intravenous ? ?PONV Risk Score and Plan: 3 and Dexamethasone, Ondansetron and Treatment may vary due to age or medical condition ? ?Airway Management Planned: Oral ETT ? ?Additional Equipment:  ? ?Intra-op Plan:  ? ?Post-operative Plan: Extubation in OR ? ?Informed Consent: I have reviewed the patients History and Physical, chart, labs and discussed the procedure including the risks, benefits and alternatives for the proposed anesthesia with the patient or authorized representative who has indicated his/her understanding and acceptance.  ? ? ? ?Dental advisory given ? ?Plan Discussed with: CRNA ? ?Anesthesia Plan Comments: (Case cancelled by patient)  ? ? ? ? ? ?Anesthesia Quick Evaluation ? ?

## 2021-11-01 NOTE — Progress Notes (Deleted)
History of Present Illness Jillian Lutz is a 71 y.o. female with ***  Cancelled bronch at the last miute. Needs to be re-scheduled.    11/01/2021  Test Results:     Latest Ref Rng & Units 09/28/2021   11:00 AM 03/07/2021   10:41 AM 02/07/2021   12:08 PM  CBC  WBC 4.0 - 10.5 K/uL 5.3   4.5   4.0    Hemoglobin 12.0 - 15.0 g/dL 11.0   11.0   11.1    Hematocrit 36.0 - 46.0 % 33.0   34.2   33.8    Platelets 150 - 400 K/uL 227   191   182         Latest Ref Rng & Units 09/28/2021   11:00 AM 03/07/2021   10:41 AM 02/07/2021   12:08 PM  BMP  Glucose 70 - 99 mg/dL 129   142   87    BUN 8 - 23 mg/dL '17   12   15    '$ Creatinine 0.44 - 1.00 mg/dL 0.75   0.82   0.77    Sodium 135 - 145 mmol/L 141   140   141    Potassium 3.5 - 5.1 mmol/L 3.7   4.0   3.9    Chloride 98 - 111 mmol/L 107   106   107    CO2 22 - 32 mmol/L '30   25   26    '$ Calcium 8.9 - 10.3 mg/dL 9.2   8.8   9.0      BNP No results found for: BNP  ProBNP No results found for: PROBNP  PFT No results found for: FEV1PRE, FEV1POST, FVCPRE, FVCPOST, TLC, DLCOUNC, PREFEV1FVCRT, PSTFEV1FVCRT  CT Abdomen Pelvis W Contrast  Result Date: 10/10/2021 CLINICAL DATA:  Colon cancer restaging, status post radiation * Tracking Code: BO * EXAM: CT ABDOMEN AND PELVIS WITH CONTRAST TECHNIQUE: Multidetector CT imaging of the abdomen and pelvis was performed using the standard protocol following bolus administration of intravenous contrast. RADIATION DOSE REDUCTION: This exam was performed according to the departmental dose-optimization program which includes automated exposure control, adjustment of the mA and/or kV according to patient size and/or use of iterative reconstruction technique. CONTRAST:  53m OMNIPAQUE IOHEXOL 300 MG/ML SOLN, additional oral enteric contrast COMPARISON:  CT chest abdomen pelvis, 12/06/2020, MR pelvis, 12/09/2020 FINDINGS: Lower chest: No acute abnormality. Significant interval enlargement of a nodule of the  dependent superior segment left lower lobe, measuring 1.2 x 1.0 cm, previously 0.4 cm (series 7, image 19). Cardiomegaly. Hepatobiliary: No solid liver abnormality is seen. No gallstones, gallbladder wall thickening, or biliary dilatation. Pancreas: Unremarkable. No pancreatic ductal dilatation or surrounding inflammatory changes. Spleen: Normal in size without significant abnormality. Adrenals/Urinary Tract: Adrenal glands are unremarkable. Kidneys are normal, without renal calculi, solid lesion, or hydronephrosis. Bladder is unremarkable. Stomach/Bowel: Stomach is within normal limits. Appendix appears normal. Diminished circumferential wall thickening of the mid rectum (series 2, image 80). Previously seen perirectal lymph nodes are resolved. New perirectal and presacral fat stranding and soft tissue thickening, consistent with radiation change. Vascular/Lymphatic: Aortic atherosclerosis. 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 No enlarged abdominal or pelvic lymph nodes. Reproductive: No mass or other significant abnormality. Other: No abdominal wall hernia or abnormality. No ascites. Musculoskeletal: No acute or significant osseous findings. IMPRESSION: 1. Diminished circumferential wall thickening of the mid rectum. Previously seen perirectal lymph nodes are resolved. Findings are consistent with treatment response. New perirectal and presacral fat stranding  and soft tissue thickening, consistent with radiation change. 2. Significant interval enlargement of a nodule of the dependent superior segment left lower lobe, consistent with an enlarged pulmonary metastasis. 3. No other evidence of metastatic disease in the abdomen or pelvis. Aortic Atherosclerosis (ICD10-I70.0). Electronically Signed   By: Delanna Ahmadi M.D.   On: 10/10/2021 09:11   CT Super D Chest Wo Contrast  Result Date: 10/26/2021 CLINICAL DATA:  Super D protocol for pre EMB biopsy. Left lower lobe pulmonary nodule. History of  rectal cancer. EXAM: CT CHEST WITHOUT CONTRAST TECHNIQUE: Multidetector CT imaging of the chest was performed using thin slice collimation for electromagnetic bronchoscopy planning purposes, without intravenous contrast. RADIATION DOSE REDUCTION: This exam was performed according to the departmental dose-optimization program which includes automated exposure control, adjustment of the mA and/or kV according to patient size and/or use of iterative reconstruction technique. COMPARISON:  CT scan 12/06/2020 and abdominal CT scan 12/07/2021 FINDINGS: Cardiovascular: The heart is within normal limits in size. No pericardial effusion. The aorta is within normal limits in caliber. Atherosclerotic calcifications mainly along the aortic arch. Scattered coronary artery calcifications. Mediastinum/Nodes: No mediastinal or hilar mass or lymphadenopathy. Small scattered lymph nodes are stable. The esophagus is grossly normal. The thyroid gland is unremarkable. Lungs/Pleura: In the superior segment of the left lower lobe there is a 10.5 mm peripheral pulmonary nodule posteriorly. This measured approximately 3 mm and May of 2022. Stable small calcified granuloma is noted in the left upper lobe medially on image 30/3. No other worrisome pulmonary nodules or pulmonary lesions. No pleural effusions or pleural nodules. Upper Abdomen: No significant upper abdominal findings. Scattered aortic calcifications. No hepatic or adrenal gland lesions are identified. Musculoskeletal: No significant bony findings. IMPRESSION: 1. 10.5 mm left lower lobe pulmonary nodule. 2. No mediastinal or hilar mass or adenopathy. 3. No significant upper abdominal findings. 4. Aortic atherosclerosis. Aortic Atherosclerosis (ICD10-I70.0). Electronically Signed   By: Marijo Sanes M.D.   On: 10/26/2021 15:15     Past medical hx Past Medical History:  Diagnosis Date   Bilateral wrist pain 10/25/2017   Hypertension    Neck mass 1998   Unknown biopsy  results. Excised in [place].   Trigger finger, acquired 02/08/2017     Social History   Tobacco Use   Smoking status: Never   Smokeless tobacco: Never  Vaping Use   Vaping Use: Never used  Substance Use Topics   Alcohol use: No   Drug use: No    Ms.Falk reports that she has never smoked. She has never used smokeless tobacco. She reports that she does not drink alcohol and does not use drugs.  Tobacco Cessation: Counseling given: Not Answered   Past surgical hx, Family hx, Social hx all reviewed.  Current Outpatient Medications on File Prior to Visit  Medication Sig   amLODipine (NORVASC) 5 MG tablet Take 1 tablet (5 mg total) by mouth daily.   gabapentin (NEURONTIN) 100 MG capsule Take 1 capsule (100 mg total) by mouth 3 (three) times daily as needed.   lisinopril-hydrochlorothiazide (ZESTORETIC) 20-12.5 MG tablet Take 2 tablets by mouth daily.   No current facility-administered medications on file prior to visit.     No Known Allergies  Review Of Systems:  Constitutional:   No  weight loss, night sweats,  Fevers, chills, fatigue, or  lassitude.  HEENT:   No headaches,  Difficulty swallowing,  Tooth/dental problems, or  Sore throat,  No sneezing, itching, ear ache, nasal congestion, post nasal drip,   CV:  No chest pain,  Orthopnea, PND, swelling in lower extremities, anasarca, dizziness, palpitations, syncope.   GI  No heartburn, indigestion, abdominal pain, nausea, vomiting, diarrhea, change in bowel habits, loss of appetite, bloody stools.   Resp: No shortness of breath with exertion or at rest.  No excess mucus, no productive cough,  No non-productive cough,  No coughing up of blood.  No change in color of mucus.  No wheezing.  No chest wall deformity  Skin: no rash or lesions.  GU: no dysuria, change in color of urine, no urgency or frequency.  No flank pain, no hematuria   MS:  No joint pain or swelling.  No decreased range of motion.  No back  pain.  Psych:  No change in mood or affect. No depression or anxiety.  No memory loss.   Vital Signs There were no vitals taken for this visit.   Physical Exam:  General- No distress,  A&Ox3 ENT: No sinus tenderness, TM clear, pale nasal mucosa, no oral exudate,no post nasal drip, no LAN Cardiac: S1, S2, regular rate and rhythm, no murmur Chest: No wheeze/ rales/ dullness; no accessory muscle use, no nasal flaring, no sternal retractions Abd.: Soft Non-tender Ext: No clubbing cyanosis, edema Neuro:  normal strength Skin: No rashes, warm and dry Psych: normal mood and behavior   Assessment/Plan  No problem-specific Assessment & Plan notes found for this encounter.    Magdalen Spatz, NP 11/01/2021  2:36 PM

## 2021-11-01 NOTE — Telephone Encounter (Signed)
[name] called in and had gotten transferred to [facility] phone and she transferred her to me.  I just made [facility] aware pt will need to keep the appt next week with Judson Roch to discuss when bronch can be rescheduled per BI.  [name] states ok.  Nothing further needed. ?

## 2021-11-01 NOTE — Telephone Encounter (Signed)
-----   Message from Alla Feeling, NP sent at 11/01/2021  2:47 PM EDT ----- ?Regarding: RE: bronch fyi ?I'm sorry about that. I appreciate you seeing her and are willing to give her another shot.  ? ?Danila Eddie, please look in her chart for congregational nurse's number to call/remind the patient about her PET scan tomorrow, f/up with Dr. Burr Medico 4/25, and pulm NP 4/28. Hopefully she is willing to do other work up.  ? ?Thanks, ?[name]  ?----- Message ----- ?From: Garner Nash, DO ?Sent: 11/01/2021   2:34 PM EDT ?To: Magdalen Spatz, NP, Alla Feeling, NP, # ?Subject: bronch fyi                                    ? ? ?FYI patient cancelled her bronch and did not show.  ?If she comes to her follow up appt next week with Sarah then I am willing to reschedule one more time to get her in.  ?Thanks, I am hoping she is willing to get this done ?Brad  ? ? ?----- Message ----- ?From: Alla Feeling, NP ?Sent: 10/13/2021   8:54 AM EDT ?To: Suzette Battiest, MD, Roosvelt Maser, # ?Subject: RE: CT Biopsy                                 ? ?OK thank you, I have contacted Dr. Valeta Harms directly and he is aware of the referral. I will cancel the incorrect biopsy order. ? ?Thanks, ?[name]  ?----- Message ----- ?From: Roosvelt Maser ?Sent: 10/13/2021   8:12 AM EDT ?To: Suzette Battiest, MD, Alla Feeling, NP ?Subject: FW: CT Biopsy                                 ? ?GM [name], ? ?Please see below what the radiologist suggested for this patient ? ?Thank you, ?Vivien Rota  ?----- Message ----- ?From: Suzette Battiest, MD ?Sent: 10/12/2021   4:19 PM EDT ?To: Roosvelt Maser ?Subject: RE: CT Biopsy                                 ? ?Would recommend deferring to Pulmonology to perform robotic/bronch guided biopsy given location. ? ?Dylan ?----- Message ----- ?From: Roosvelt Maser ?Sent: 10/12/2021   9:38 AM EDT ?To: Ir Procedure Requests ?Subject: CT Biopsy                                     ? ? ? ?CT Biopsy ? ? lung mass suspect metastatic rectal cancer.  please obtain enough for tissue diagnosis and molecular studies ? ?CT done 10/10/21 ? ?[address] ?[phone] ? ? ? ? ? ? ?

## 2021-11-01 NOTE — Telephone Encounter (Signed)
Garner Nash, DO ?to Me  Magdalen Spatz, NP   ?   2:35 PM ?  ?Patient is seeing Judson Roch next week. She will need to come to this appt to discuss potential for rescheduling dates in the future.  ? ?Thanks  ? ?BLI  ? ?I tried calling the pt and there was no answer- LMTCB ?

## 2021-11-02 ENCOUNTER — Encounter (HOSPITAL_COMMUNITY)
Admission: RE | Admit: 2021-11-02 | Discharge: 2021-11-02 | Disposition: A | Discharge status: Home / Self Care | Payer: Self-pay | Source: Ambulatory Visit | Attending: Nurse Practitioner | Admitting: Nurse Practitioner

## 2021-11-02 DIAGNOSIS — R918 Other nonspecific abnormal finding of lung field: Secondary | ICD-10-CM | POA: Insufficient documentation

## 2021-11-02 DIAGNOSIS — C20 Malignant neoplasm of rectum: Secondary | ICD-10-CM | POA: Insufficient documentation

## 2021-11-07 ENCOUNTER — Telehealth: Payer: Self-pay

## 2021-11-07 NOTE — Telephone Encounter (Signed)
Patient called to state that she has decided not to continue testing and treatment for lung mass. She asked that all future appointments with Cancer center as well as PET scan be cancelled.  When questioned about reason for stopping treatment she stated she is leaving it in God's hands.  Encouraged her to reconsider and call back if she wants to reschedule.  Jake Michaelis RN, Congregational Nurse [phone] ?

## 2021-11-08 ENCOUNTER — Ambulatory Visit: Payer: Self-pay | Admitting: Hematology

## 2021-11-11 ENCOUNTER — Ambulatory Visit: Payer: Self-pay | Admitting: Acute Care

## 2021-11-16 NOTE — Congregational Nurse Program (Signed)
CN office visit with interpreter [name] assisting.  Patient received [facility] medical bills.  CN called billing office to inform that patient has 100% discount.  Bills will be adjusted.  Patient stated that she has decided to proceed with whatever testing and treatment her doctors advise and that she wants to reschedule missed appointments. CN will assist with making appointments.  Jake Michaelis RN, Congregational Nurse [phone] ?

## 2021-11-18 ENCOUNTER — Telehealth: Payer: Self-pay

## 2021-11-18 NOTE — Telephone Encounter (Signed)
RN left voicemail to pt's congregational nurse to set up CT scan.  ?

## 2021-11-18 NOTE — Telephone Encounter (Signed)
Phone call to patient with interpreter Janie Morning assisting.  Patient informed of 11/24/2021 7;00 am appointment (arrive at 6:30 am) for PET scan at [place] her not to eat after midnight prior to appointment. CN will request transportation.   ?Jake Michaelis RN, Congregational Nurse [phone] ?

## 2021-11-18 NOTE — Telephone Encounter (Signed)
Pet scan scheduled for Thursday, 5/11 at 0700. Jake Michaelis, congregational RN was contacted. She verbalized understanding of time and date and will contact patient. She is aware that patient is to be NPO for 6 hours prior to scan and to call scheduling for further questions. [name], RN was given central scheduling number should the patient need to reschedule. ?

## 2021-11-21 ENCOUNTER — Other Ambulatory Visit: Payer: Self-pay

## 2021-11-21 ENCOUNTER — Other Ambulatory Visit (HOSPITAL_COMMUNITY): Payer: Self-pay

## 2021-11-21 DIAGNOSIS — I10 Essential (primary) hypertension: Secondary | ICD-10-CM

## 2021-11-21 NOTE — Telephone Encounter (Signed)
PCCM: ? ?I received a message from Silvio Clayman, NP.  She was able to reach out to the [place], [name].  The patient is ready to proceed with PET scan and bronchoscopy.  Patient has a PET scan scheduled on 11/24/2021. ? ?I am willing to go ahead and get her scheduled for bronchoscopy.  Tentative bronchoscopy dates available.  Potential bronchoscopy dates are the following ? ?11/28/2021 ?12/06/2021 ?12/13/2021 ? ?Hopefully the patient will be able to have her bronchoscopy completed on one of the following 3 days. ? ?Okay to use the same orders that were placed on her previous bronchoscopy for robotic assisted navigation. ? ?Garner Nash, DO ?[facility] ?11/21/2021 5:18 PM   ?

## 2021-11-23 NOTE — Congregational Nurse Program (Signed)
Home visit with interpreter [name].  Delivered medications which were picked up from [place].  Patient complaining of constipation and will try Miralax. Reminded her of appointment tomorrow for PET scan at [facility].  She understands she is not to eat or drink after midnight tonight.  Transportation [employer] taxi )will arrive at [facility] given.  Also reminded (verbally and in writing) of 11/25/2021 appointment with Dr. Burr Medico at [facility].  Transportation requested thru [facility] orange card.  Discussed need to reschedule lung biopsy.  Patient given date options provided date options provided by Judeen Hammans at [facility] and she chose Dec 13, 2021.  Jake Michaelis RN, Congregational Nurse [phone] ?

## 2021-11-23 NOTE — Telephone Encounter (Signed)
Pt has been scheduled for follow up visit.  I called [name] and gave her appt info.  Nothing further needed. ?

## 2021-11-24 ENCOUNTER — Ambulatory Visit (HOSPITAL_COMMUNITY)
Admission: RE | Admit: 2021-11-24 | Discharge: 2021-11-24 | Disposition: A | Discharge status: Home / Self Care | Payer: Self-pay | Source: Ambulatory Visit | Attending: Nurse Practitioner | Admitting: Nurse Practitioner

## 2021-11-24 DIAGNOSIS — R911 Solitary pulmonary nodule: Secondary | ICD-10-CM | POA: Insufficient documentation

## 2021-11-24 DIAGNOSIS — R970 Elevated carcinoembryonic antigen [CEA]: Secondary | ICD-10-CM | POA: Insufficient documentation

## 2021-11-24 DIAGNOSIS — Z923 Personal history of irradiation: Secondary | ICD-10-CM | POA: Insufficient documentation

## 2021-11-24 DIAGNOSIS — R918 Other nonspecific abnormal finding of lung field: Secondary | ICD-10-CM | POA: Insufficient documentation

## 2021-11-24 DIAGNOSIS — C20 Malignant neoplasm of rectum: Secondary | ICD-10-CM | POA: Insufficient documentation

## 2021-11-24 DIAGNOSIS — Z9221 Personal history of antineoplastic chemotherapy: Secondary | ICD-10-CM | POA: Insufficient documentation

## 2021-11-24 LAB — GLUCOSE, CAPILLARY: Glucose-Capillary: 106 mg/dL — ABNORMAL HIGH (ref 70–99)

## 2021-11-24 MED ORDER — FLUDEOXYGLUCOSE F - 18 (FDG) INJECTION
5.7800 | Freq: Once | INTRAVENOUS | Status: AC | PRN
  Administered 2021-11-24: 5.78 via INTRAVENOUS

## 2021-11-25 ENCOUNTER — Other Ambulatory Visit (HOSPITAL_COMMUNITY): Payer: Self-pay

## 2021-11-25 ENCOUNTER — Inpatient Hospital Stay: Payer: Self-pay | Attending: Physician Assistant | Admitting: Hematology

## 2021-11-25 ENCOUNTER — Encounter: Payer: Self-pay | Admitting: Hematology

## 2021-11-25 ENCOUNTER — Telehealth: Payer: Self-pay | Admitting: Pharmacist

## 2021-11-25 ENCOUNTER — Other Ambulatory Visit: Payer: Self-pay

## 2021-11-25 VITALS — BP 150/84 | HR 65 | Temp 98.4°F | Resp 18 | Ht 59.0 in | Wt 116.1 lb

## 2021-11-25 DIAGNOSIS — C20 Malignant neoplasm of rectum: Secondary | ICD-10-CM

## 2021-11-25 DIAGNOSIS — R911 Solitary pulmonary nodule: Secondary | ICD-10-CM | POA: Insufficient documentation

## 2021-11-25 DIAGNOSIS — E1140 Type 2 diabetes mellitus with diabetic neuropathy, unspecified: Secondary | ICD-10-CM | POA: Insufficient documentation

## 2021-11-25 DIAGNOSIS — Z79899 Other long term (current) drug therapy: Secondary | ICD-10-CM | POA: Insufficient documentation

## 2021-11-25 DIAGNOSIS — I10 Essential (primary) hypertension: Secondary | ICD-10-CM | POA: Insufficient documentation

## 2021-11-25 DIAGNOSIS — D649 Anemia, unspecified: Secondary | ICD-10-CM | POA: Insufficient documentation

## 2021-11-25 MED ORDER — CAPECITABINE 500 MG PO TABS: 1000.0000 mg/m2 | ORAL_TABLET | Freq: Two times a day (BID) | ORAL | 1 refills | Status: DC

## 2021-11-25 MED ORDER — CAPECITABINE 500 MG PO TABS
1000.0000 mg/m2 | ORAL_TABLET | Freq: Two times a day (BID) | ORAL | 1 refills | Status: DC
  Filled 2021-11-25: qty 84, 14d supply, fill #0

## 2021-11-25 NOTE — Telephone Encounter (Addendum)
Oral Oncology Pharmacist Encounter ? ?Patient previously enrolled in [place] patient assistance for Xeloda and remains approved. Called and verified patients current enrollment status is active.  ? ?New prescription for Xeloda redirected to [facility] for dispensing.  ? ?Leron Croak, PharmD, BCPS ?Hematology/Oncology Clinical Pharmacist ?[facility] ?[phone] ?11/25/2021 2:41 PM ? ? ? ?

## 2021-11-25 NOTE — Progress Notes (Signed)
?[place]   ?Telephone:([phone]) [phone] [phone]   ?Clinic Follow up Note  ? ?Patient Care Team: ?Gaylan Gerold, DO as PCP - General (Internal Medicine) ?Jonnie Finner, RN (Inactive) as Oncology Nurse Navigator ?Alla Feeling, NP as Nurse Practitioner (Nurse Practitioner) ?[name], MD as Consulting Physician (Hematology and Oncology) ?Kyung Rudd, MD as Consulting Physician (Radiation Oncology) ? ?Date of Service:  11/25/2021 ? ?CHIEF COMPLAINT: f/u of rectal cancer ? ?CURRENT THERAPY:  ?Surveillance ? ?ASSESSMENT & PLAN:  ?Jillian Lutz is a 71 y.o. female with  ? ?1.  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 Silverio Decamp, path from ascending colon polyp and rectal mass both showed adenocarcinoma.  ?-Staging CT CAP on 12/06/20 showed: perirectal lymph node involvement; single nonspecific left pulmonary nodule.  ?-Despite strong recommendation, patient firmly and repeatedly declined IV chemo and surgery, due to the fair of being sick and not able to take time off from work  ?-she agreed to chemoRT with Xeloda, 01/04/21 - 02/14/21. She tolerated well. ?-she previously declined surveillance measures, however, she developed pencil-thin stool and decreased caliber of stool. Her CEA normalized after chemoRT but increased to 18.49 on 09/28/21. She agreed to work up, and CT AP on 10/07/21 showed enlargement of LLL nodule. ?-further work up with PET yesterday, 11/24/21, showed: hypermetabolic LLL nodule; marked hypermetabolism corresponding to residual rectal wall thickening; no other evidence of hypermetabolic metastasis. I reviewed the images with her  ?-she is scheduled for f/u with pulmonary on 6/9. She agrees to go (previously declined biopsy but open to it now).    ?-we again  discussed treatment options.  Given the probable metastatic disease, I recommend systemic chemotherapy first, if no further metastasis, consider surgical resection of the primary tumor, and oligo lung metastasis, or SBRT to the lung metastasis.  Given the slow-growing of her tumor, there is decent chance of cure if she proceed with chemo and surgery.  I recommend FOLFOX, CapeOx or FOLFIRI, but she again declines intravenous chemo.  Her husband is disabled, she is afraid of being sick and not able to work to support her family.  Her children are still in [place]. ?-After lengthy discussion, she agreed to start Xeloda if biopsy confirms malignancy.  I will see if we can do NGS on her biopsy to see if she is a candidate for targeted therapy.  ?-I called in Xeloda to [facility], plan to start after her lung biopsy.  ? ?  ?2. Symptom Management: low appetite, difficulty sleeping, low hemoglobin ?-she was prescribed mirtazapine on 10/11/21 for appetite and sleep. ?-labs from 09/28/21 showed hgb 11, iron panel and ferritin WNL.  Anemia is likely related to her underlying malignancy. ?  ?3. Social support ?-She is here with her husband, moved from [place] in 2014.  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 She has been approved for free drug replacement through [facility], documents in Epic. ?-She has Research officer, trade union and Optometrist.  ?  ?4.  HTN, DM, pre-existing neuropathy ?-she is taking lisinopril, gabapentin, amlodipine, and metformin. ?  ?  ?PLAN ?-PET scan reviewed ?-She is now open  to lung biopsy.  I sent a message to Dr. Katy Fitch to see if we can move her follow-up to next 1-2 weeks  ?-I called in Xeloda to [place], plan to start after her lung biopsy. ?-will check if we can do NGS on her biopsy without her OOP cost   ? ? ?No problem-specific Assessment & Plan notes found for this encounter. ? ? ?SUMMARY OF ONCOLOGIC HISTORY: ?Oncology History Overview Note  ?Cancer Staging ?Rectal adenocarcinoma ([place]) ?Staging form: Colon and Rectum, AJCC 8th Edition ?- Clinical stage from 12/21/2020: Stage IIIB (cT3, cN1, cM0) - Unsigned ? ?  ?Rectal adenocarcinoma ([place])  ?08/26/2020 Miscellaneous  ? Initial presentation to PCP, reporting intermittent BRBPR since 02/2020  ?  ?12/01/2020 Procedure  ? Colonoscopy by Dr. Silverio Decamp findings ?- The perianal and digital rectal examinations were normal. ?- A 15 mm polyp was found in the ascending colon. The polyp was semi-pedunculated. Resection and retrieval were complete. ?- A 25 mm polyp was found in the sigmoid colon. The polyp was pedunculated. Resection and retrieval were complete. ?- An infiltrative partially obstructing large mass was found in the proximal rectum. The mass was partially circumferential (involving one-half of the lumen circumference). The mass measured eight cm in length extending from 10 to 18cm from anal verge. This was biopsied with a cold forceps for histology. Proximal and distal opposite fold area of the mass lesion was tattooed with an injection of total 3 mL of Spot (carbon black). ?  ?12/01/2020 Initial Biopsy  ? Diagnosis ?1. Colon, polyp(s), ascending x 1 ?- ADENOCARCINOMA ARISING IN A TUBULAR ADENOMA WITH HIGH-GRADE DYSPLASIA. SEE NOTE ?2. Colon, sigmoid polyp, x1 ?- TUBULOVILLOUS ADENOMA(S) ?- NEGATIVE FOR HIGH-GRADE DYSPLASIA OR MALIGNANCY ?3. Rectum, biopsy ?- ADENOCARCINOMA. SEE NOTE ?  ?12/01/2020 Cancer Staging  ? Cancer Staging ?Rectal adenocarcinoma ([place]) ?Staging form: Colon and Rectum, AJCC 8th Edition ?- Clinical stage from 12/21/2020: Stage IIIB (cT3, cN1, cM0) - Unsigned ? ? ? ?  ?12/06/2020 Imaging  ? CT CAP with contrast IMPRESSION: ?1. There is partially circumferential soft tissue thickening of the ?mid to superior rectum, approximately 5 cm in length and  the ?inferior extent approximately 6 cm above the anal verge, consistent ?with primary rectal malignancy identified by colonoscopy. ?2. There appear to be abnormally enlarged perirectal lymph nodes ?posteriorly about the superior rectum measuring up to 1.0 x 0.8 cm. ?Findings are suspicious for perirectal Lutz metastatic disease, ?however rectal MRI is the test of choice for initial local staging ?of rectal cancer. ?3. There is a 4 mm nonspecific pulmonary nodule of the superior ?segment left lower lobe, statistically most likely incidental, ?infectious or inflammatory, although nonspecific and isolated ?metastatic disease is not strictly excluded. Attention on follow-up. ?4. No other evidence of metastatic disease in the chest, abdomen, or ?pelvis. ?Aortic Atherosclerosis (ICD10-I70.0). ?  ?12/09/2020 Imaging  ? Local staging MRI pelvis without contrast IMPRESSION: ?4.9 cm circumferential mid/lower rectal mass, corresponding to the ?patient's newly diagnosed rectal cancer. ?Rectal adenocarcinoma T stage: T3c ?Rectal adenocarcinoma N stage:  N1 ?Distance from tumor to the internal anal sphincter is 3.7 cm. ?  ?12/21/2020 Initial Diagnosis  ? Rectal adenocarcinoma ([place]) ?  ?01/04/2021 -  Chemotherapy  ? Concurrent chemoradiation with Xeloda '1000mg'$  in the AM and '1500mg'$  in the PM on days of Radiation starting 01/04/21.  ?  ? ?  ?01/04/2021 - 02/11/2021 Radiation Therapy  ? Concurrent chemoradiation with Dr Lisbeth Renshaw and Xeloda starting 01/04/21.  ?  ? ? ? ?INTERVAL HISTORY:  ?  Jillian Lutz is here for a follow up of . She was last seen by NP [name] about 6 weeks ago . She presents to the clinic accompanied by an interpreter.  She is overall doing well, still has things stool, no hematochezia, no cough, dyspnea, or chest pain.  She has mild fatigue, still able to work full-time. ?  ?All other systems were reviewed with the patient and are negative. ? ?MEDICAL HISTORY:  ?Past Medical History:  ?Diagnosis Date  ? Bilateral wrist pain  10/25/2017  ? Hypertension   ? Neck mass 1998  ? Unknown biopsy results. Excised in [place].  ? Trigger finger, acquired 02/08/2017  ? ? ?SURGICAL HISTORY: ?Past Surgical History:  ?Procedure Laterality Date

## 2021-11-28 ENCOUNTER — Other Ambulatory Visit (HOSPITAL_COMMUNITY): Payer: Self-pay

## 2021-11-28 NOTE — Telephone Encounter (Signed)
Oral Oncology Pharmacist Encounter ? ?Received new prescription for Xeloda (capecitabine) for the re-initiation of treatment for rectal cancer, planned duration until disease progression or unacceptable drug toxicity. ? ?CBC w/ diff and CMP from 09/28/21 assessed, no baseline dose adjustments required. Prescription dose and frequency assessed for appropriateness. Per MD note patient will not start until after lung biopsy. ? ?Current medication list in Epic reviewed, no relevant/significant DDIs with Xeloda identified. ? ?Evaluated chart and no patient barriers to medication adherence noted.  ? ?Patient agreement for treatment documented in MD note on 11/25/21. ? ?Prescription has been sent to [facility] as patient was previously already enrolled in [facility] patient assistance prior to the end of their program. Confirmed with Thedora Hinders that they will still provide the patient free drug at this time. ? ?Oral Oncology Clinic will continue to follow for counseling and start date. ? ?Leron Croak, PharmD, BCPS ?Hematology/Oncology Clinical Pharmacist ?[facility] Oral Chemotherapy Navigation Clinics ?[phone] ?11/28/2021 7:19 AM ? ?

## 2021-11-29 ENCOUNTER — Telehealth: Payer: Self-pay

## 2021-11-29 NOTE — Telephone Encounter (Signed)
Oral Chemotherapy Pharmacist Encounter  ? ?Spoke with patient's RN Jake Michaelis regarding the new prescription for Xeloda (capecitabine) for [name].  ? ?Informed RN that patient is still enrolled in manufacturer assistance through [facility] and is still eligible to obtain the medication from their program at no cost. Phone number provided to set up shipment to patient's home [phone]).  ? ?RN is aware that patient is to not start medication until directed by Dr. Burr Medico, tentatively after lung biopsy. They will hold onto medication until directed to start.  ? ?Leron Croak, PharmD, BCPS ?Hematology/Oncology Clinical Pharmacist ?[facility] Oral Chemotherapy Navigation Clinics ?[phone] ?11/29/2021 10:47 AM ? ?

## 2021-11-29 NOTE — Telephone Encounter (Signed)
Phone call to [employer] to schedule home delivery of Xeloda in response to request from Wells Guiles, Software engineer at [facility]. Delivery set up for 12/07/2021.  CN will call patient tomorrow (with interpreter) to advise of delivery date and that someone must be home to sign.  Will also remind her that she does not start the medication until instructed following her lung biopsy.   Jake Michaelis RN, Congregational Nurse [phone] ? ?

## 2021-11-30 ENCOUNTER — Telehealth: Payer: Self-pay

## 2021-11-30 ENCOUNTER — Telehealth: Payer: Self-pay | Admitting: Hematology

## 2021-11-30 NOTE — Telephone Encounter (Signed)
Phone call to patient with interpreter [name] assisting.  Told patient that Xeloda is to be delivered by [employer] on 12/07/2021 and that someone must be home to sign for it.  Instructed her not to start medication until Dr. Burr Medico tells her to begin. Reminded her of lung biopsy on 12/13/2021 and that she must be NPO after midnight.  She needs to have Covid test on May 26. Advised that she also has more appointments scheduled and we will home visit next Wednesday with written information regarding all upcoming appointments.  Jake Michaelis RN, Congregational Nurse [phone] ?

## 2021-11-30 NOTE — Telephone Encounter (Signed)
.  Called patient to schedule appointment per 5/17 inbasket, patient is aware of date and time.   ?

## 2021-12-07 NOTE — Congregational Nurse Program (Signed)
Home visit with interpreter [name].  Patient received Xeloda today (UPS delivered).  Reminded her to not take it until Dr. Burr Medico tells her to start. Reviewed instructions for lung biopsy scheduled for 12/13/2021. Patient understands she needs to be NPO after midnight.  Marked Amlodipine bottle so she knows which medicine to take the morning of surgery. CN will pick up Dial soap.  She will get Covid test done on 12/09/2021.  She will see Cassie PA at [facility] on 12/16/2021 for follow-up after biopsy.  She continues to complain of constipation and abdominal pain and stated that Miralax did not help.  Recommended she try dulcolax and fiber supplement.  Patient requested note from doctor for light duty following her biopsy.  CN will notify Dr. Juline Patch office regarding note and also call her boss to let him know she will not be working on 12/13/2021.  Jake Michaelis RN, Congregational Nurse [phone]

## 2021-12-08 ENCOUNTER — Telehealth: Payer: Self-pay | Admitting: Pulmonary Disease

## 2021-12-09 ENCOUNTER — Other Ambulatory Visit: Payer: Self-pay

## 2021-12-09 ENCOUNTER — Other Ambulatory Visit: Payer: Self-pay | Admitting: Pulmonary Disease

## 2021-12-09 ENCOUNTER — Encounter (HOSPITAL_COMMUNITY): Payer: Self-pay | Admitting: Pulmonary Disease

## 2021-12-09 LAB — SARS CORONAVIRUS 2 (TAT 6-24 HRS): SARS Coronavirus 2: NEGATIVE

## 2021-12-09 NOTE — Progress Notes (Addendum)
No interpreter available through the [employer]. This Probation officer called Jake Michaelis, RN, Congregational Nurse, who was with the patient for the COVID test. Patient received pre-op instructions and questions were answered. Patient verbalized understanding of instructions  PCP - Gaylan Gerold, DO Cardiologist - denies  PPM/ICD - denies  Chest x-ray - 02/16/2017 EKG - 12/13/21 Stress Test - denies ECHO - denies Cardiac Cath - denies  CPAP - n/a  Fasting Blood Sugar - n/a  Blood Thinner Instructions: n/a Aspirin Instructions: Patient was instructed: As of today, STOP taking any Aspirin (unless otherwise instructed by your surgeon) Aleve, Naproxen, Ibuprofen, Motrin, Advil, Goody's, BC's, all herbal medications, fish oil, and all vitamins.  Xeloda - didn't start, yet.  ERAS Protcol - n/a  COVID TEST- patient will have the test today  Anesthesia review: no  Patient verbally denies any shortness of breath, fever, cough and chest pain during phone call   -------------  SDW INSTRUCTIONS given:  Your procedure is scheduled on Tuesday, May 30th, 2023.  Report to [facility] Main Entrance "A" at 10:15 A.M., and check in at the Admitting office.  Call this number if you have problems the morning of surgery:  [phone]   Remember:  Do not eat or drink after midnight the night before your surgery    Take these medicines the morning of surgery with A SIP OF WATER Amlodipine, Tylenol, Gabapentin.   The day of surgery:             Do not wear jewelry, make up, or nail polish            Do not wear lotions, powders, perfume, or deodorant.            Do not shave 48 hours prior to surgery.              Do not bring valuables to the hospital.            [facility] is not responsible for any belongings or valuables.  Do NOT Smoke (Tobacco/Vaping) 24 hours prior to your procedure If you use a CPAP at night, you may bring all equipment for your overnight stay.   Contacts,  glasses, dentures or bridgework may not be worn into surgery.      For patients admitted to the hospital, discharge time will be determined by your treatment team.   Patients discharged the day of surgery will not be allowed to drive home, and someone needs to stay with them for 24 hours.  Special instructions:   [facility]- Preparing For Surgery  Before surgery, you can play an important role. Because skin is not sterile, your skin needs to be as free of germs as possible. You can reduce the number of germs on your skin by washing with CHG (chlorahexidine gluconate) Soap before surgery.  CHG is an antiseptic cleaner which kills germs and bonds with the skin to continue killing germs even after washing.    Oral Hygiene is also important to reduce your risk of infection.  Remember - BRUSH YOUR TEETH THE MORNING OF SURGERY WITH YOUR REGULAR TOOTHPASTE  Please do not use if you have an allergy to CHG or antibacterial soaps. If your skin becomes reddened/irritated stop using the CHG.  Do not shave (including legs and underarms) for at least 48 hours prior to first CHG shower. It is OK to shave your face.  Please follow these instructions carefully.   Shower the NIGHT BEFORE SURGERY and the [facility]  OF SURGERY with DIAL Soap.   Pat yourself dry with a CLEAN TOWEL.  Wear CLEAN PAJAMAS to bed the night before surgery  Place CLEAN SHEETS on your bed the night of your first shower and DO NOT SLEEP WITH PETS.   Day of Surgery: Please shower morning of surgery  Wear Clean/Comfortable clothing the morning of surgery Do not apply any deodorants/lotions.   Remember to brush your teeth WITH YOUR REGULAR TOOTHPASTE.   Questions were answered. Patient verbalized understanding of instructions.

## 2021-12-09 NOTE — Telephone Encounter (Signed)
Called and spoke with [name]. She stated that the patient is scheduled for a bronch on 05/30 and is scheduled to return back to work on 05/31. Patient has a very strenuous job with heavy lifting. The patient wants to return back to work but wants to be on light duty. Because of this, [name] is requesting a note that states she can work but for [employer] duty. She wishes to come by the office on Tuesday to pick up the letter.   Dr. Valeta Harms, please advise if you are ok with this letter. Thanks!

## 2021-12-09 NOTE — Telephone Encounter (Signed)
Called and spoke with Jillian Lutz. She is aware to ask for the letter on Monday. I will go ahead and type the letter for it to be printed on Monday.   Nothing further needed at time of call.

## 2021-12-12 NOTE — Progress Notes (Unsigned)
[facility] OFFICE PROGRESS NOTE  Gaylan Gerold, DO [address] Jillian Lutz [id number]  DIAGNOSIS: f/u of rectal cancer  Oncology History Overview Note  Cancer Staging Rectal adenocarcinoma [facility]) Staging form: Colon and Rectum, AJCC 8th Edition - Clinical stage from 12/21/2020: Stage IIIB (cT3, cN1, cM0) - Unsigned    Rectal adenocarcinoma ([place])  08/26/2020 Miscellaneous   Initial presentation to PCP, reporting intermittent BRBPR since 02/2020    12/01/2020 Procedure   Colonoscopy by Dr. Silverio Decamp findings - The perianal and digital rectal examinations were normal. - A 15 mm polyp was found in the ascending colon. The polyp was semi-pedunculated. Resection and retrieval were complete. - A 25 mm polyp was found in the sigmoid colon. The polyp was pedunculated. Resection and retrieval were complete. - An infiltrative partially obstructing large mass was found in the proximal rectum. The mass was partially circumferential (involving one-half of the lumen circumference). The mass measured eight cm in length extending from 10 to 18cm from anal verge. This was biopsied with a cold forceps for histology. Proximal and distal opposite fold area of the mass lesion was tattooed with an injection of total 3 mL of Spot (carbon black).   12/01/2020 Initial Biopsy   Diagnosis 1. Colon, polyp(s), ascending x 1 - ADENOCARCINOMA ARISING IN A TUBULAR ADENOMA WITH HIGH-GRADE DYSPLASIA. SEE NOTE 2. Colon, sigmoid polyp, x1 - TUBULOVILLOUS ADENOMA(S) - NEGATIVE FOR HIGH-GRADE DYSPLASIA OR MALIGNANCY 3. Rectum, biopsy - ADENOCARCINOMA. SEE NOTE   12/01/2020 Cancer Staging   Cancer Staging Rectal adenocarcinoma [facility]) Staging form: Colon and Rectum, AJCC 8th Edition - Clinical stage from 12/21/2020: Stage IIIB (cT3, cN1, cM0) - Unsigned      12/06/2020 Imaging   CT CAP with contrast IMPRESSION: 1. There is partially circumferential soft tissue thickening of the mid to superior rectum,  approximately 5 cm in length and the inferior extent approximately 6 cm above the anal verge, consistent with primary rectal malignancy identified by colonoscopy. 2. There appear to be abnormally enlarged perirectal lymph nodes posteriorly about the superior rectum measuring up to 1.0 x 0.8 cm. Findings are suspicious for perirectal nodal metastatic disease, however rectal MRI is the test of choice for initial local staging of rectal cancer. 3. There is a 4 mm nonspecific pulmonary nodule of the superior segment left lower lobe, statistically most likely incidental, infectious or inflammatory, although nonspecific and isolated metastatic disease is not strictly excluded. Attention on follow-up. 4. No other evidence of metastatic disease in the chest, abdomen, or pelvis. Aortic Atherosclerosis (ICD10-I70.0).   12/09/2020 Imaging   Local staging MRI pelvis without contrast IMPRESSION: 4.9 cm circumferential mid/lower rectal mass, corresponding to the patient's newly diagnosed rectal cancer. Rectal adenocarcinoma T stage: T3c Rectal adenocarcinoma N stage:  N1 Distance from tumor to the internal anal sphincter is 3.7 cm.   12/21/2020 Initial Diagnosis   Rectal adenocarcinoma ([place])   01/04/2021 -  Chemotherapy   Concurrent chemoradiation with Xeloda '1000mg'$  in the AM and '1500mg'$  in the PM on days of Radiation starting 01/04/21.       01/04/2021 - 02/11/2021 Radiation Therapy   Concurrent chemoradiation with Dr Lisbeth Renshaw and Xeloda starting 01/04/21.       CURRENT THERAPY: Xeloda 1500 mg BID expected to start on ***  INTERVAL HISTORY: Jillian Lutz 71 y.o. female returns a follow-up visit accompanied by her interpretor. In August 2022, the patient had completed concurrent chemoradiation with Xeloda.  Dr. Burr Medico recommended repeat sigmoidoscopy to evaluate treatment response.  Of note, the patient previously declined IV chemotherapy and surgery despite repeated discussions and strong recommendations  for this.  The patient declined repeat sigmoidoscopy.  She also did not want any follow-up scans or procedures and want to follow-up clinically as she develops new or worsening symptoms.  In March 2023, the patient was re-evaluated in the clinic due to a 2 month report of worsening pencil thin stools/decreased caliber of stools. She was agreeable to undergo repeat CT scan evaluation/PET which showed treatment response in the abdomen/pelvis, but noted significant enlargement of a nodule in the dependent superior segment of the left lower lobe. The PET noted hypermetabolic activity in the LLL and hypermetabolism of the rectal wall thickening which favored residual disease vs radiation change.   The patient was seen by Dt. Feng last on 11/25/21. Dr. Burr Medico discussed options which included systemic chemotherapy first if no other metastases, consider resection of the primary tumor and oligo lung metastases or SBRT to the lung. The patient declined IV chemotherapy. Her husband is disabled and she works to support her family/children in [place]. The patient underwent bronchoscopy on 12/14/21 under the care of Dr. Valeta Harms and the final pathology of the lung lesion is ***. The patient was agreeable to undergo treatment with Xeloda.   Otherwise, she denies new concerning complaints today. She denies any rectal pain or bleeding.  She denies any melena or hematochezia.  Denies any fever, chills, night sweats, or unexplained weight loss. Denies any appetite changes.  Denies any nausea, vomiting, diarrhea, or constipation.  Denies any abdominal pain.  Denies any cough or shortness of breath.  Denies any dysphagia or odynophagia.  Denies any back pain.  Denies any jaundice or abdominal distention.  She is here today for evaluation ***.      MEDICAL HISTORY: Past Medical History:  Diagnosis Date   Bilateral wrist pain 10/25/2017   Hypertension    Neck mass 1998   Unknown biopsy results. Excised in [place].    Pre-diabetes    Trigger finger, acquired 02/08/2017    ALLERGIES:  has No Known Allergies.  MEDICATIONS:  Current Outpatient Medications  Medication Sig Dispense Refill   acetaminophen (TYLENOL) 500 MG tablet Take 500 mg by mouth daily.     amLODipine (NORVASC) 5 MG tablet Take 1 tablet (5 mg total) by mouth daily. 90 tablet 3   capecitabine (XELODA) 500 MG tablet Take 3 tablets (1,500 mg total) by mouth 2 (two) times daily after a meal. Take for 14 days on, 7 days off. Repeat every 21 days. 84 tablet 1   gabapentin (NEURONTIN) 100 MG capsule Take 1 capsule (100 mg total) by mouth 3 (three) times daily as needed. (Patient taking differently: Take 100 mg by mouth daily.) 90 capsule 2   lisinopril-hydrochlorothiazide (ZESTORETIC) 20-12.5 MG tablet Take 2 tablets by mouth daily. 60 tablet 3   Multiple Vitamins-Minerals (MULTIVITAMIN WITH MINERALS) tablet Take 1 tablet by mouth daily. Gummy 50 mg     No current facility-administered medications for this visit.    SURGICAL HISTORY:  Past Surgical History:  Procedure Laterality Date   NECK SURGERY Left 1998   Excision of mass in [place]    REVIEW OF SYSTEMS:   Review of Systems  Constitutional: Negative for appetite change, chills, fatigue, fever and unexpected weight change.  HENT:   Negative for mouth sores, nosebleeds, sore throat and trouble swallowing.   Eyes: Negative for eye problems and icterus.  Respiratory: Negative for cough, hemoptysis, shortness of breath and  wheezing.   Cardiovascular: Negative for chest pain and leg swelling.  Gastrointestinal: Negative for abdominal pain, constipation, diarrhea, nausea and vomiting.  Genitourinary: Negative for bladder incontinence, difficulty urinating, dysuria, frequency and hematuria.   Musculoskeletal: Negative for back pain, gait problem, neck pain and neck stiffness.  Skin: Negative for itching and rash.  Neurological: Negative for dizziness, extremity weakness, gait problem,  headaches, light-headedness and seizures.  Hematological: Negative for adenopathy. Does not bruise/bleed easily.  Psychiatric/Behavioral: Negative for confusion, depression and sleep disturbance. The patient is not nervous/anxious.     PHYSICAL EXAMINATION:  There were no vitals taken for this visit.  ECOG PERFORMANCE STATUS: {CHL ONC ECOG [id number]  Physical Exam  Constitutional: Oriented to person, place, and time and well-developed, well-nourished, and in no distress. No distress.  HENT:  Head: Normocephalic and atraumatic.  Mouth/Throat: Oropharynx is clear and moist. No oropharyngeal exudate.  Eyes: Conjunctivae are normal. Right eye exhibits no discharge. Left eye exhibits no discharge. No scleral icterus.  Neck: Normal range of motion. Neck supple.  Cardiovascular: Normal rate, regular rhythm, normal heart sounds and intact distal pulses.   Pulmonary/Chest: Effort normal and breath sounds normal. No respiratory distress. No wheezes. No rales.  Abdominal: Soft. Bowel sounds are normal. Exhibits no distension and no mass. There is no tenderness.  Musculoskeletal: Normal range of motion. Exhibits no edema.  Lymphadenopathy:    No cervical adenopathy.  Neurological: Alert and oriented to person, place, and time. Exhibits normal muscle tone. Gait normal. Coordination normal.  Skin: Skin is warm and dry. No rash noted. Not diaphoretic. No erythema. No pallor.  Psychiatric: Mood, memory and judgment normal.  Vitals reviewed.  LABORATORY DATA: Lab Results  Component Value Date   WBC 5.3 09/28/2021   HGB 11.0 (L) 09/28/2021   HCT 33.0 (L) 09/28/2021   MCV 78.2 (L) 09/28/2021   PLT 227 09/28/2021      Chemistry      Component Value Date/Time   NA 141 09/28/2021 1100   NA 139 09/29/2019 1406   K 3.7 09/28/2021 1100   CL 107 09/28/2021 1100   CO2 30 09/28/2021 1100   BUN 17 09/28/2021 1100   BUN 12 09/29/2019 1406   CREATININE 0.75 09/28/2021 1100      Component  Value Date/Time   CALCIUM 9.2 09/28/2021 1100   ALKPHOS 44 09/28/2021 1100   AST 13 (L) 09/28/2021 1100   ALT 5 09/28/2021 1100   BILITOT 0.3 09/28/2021 1100       RADIOGRAPHIC STUDIES:  NM PET Image Initial (PI) Skull Base To Thigh  Result Date: 11/25/2021 CLINICAL DATA:  Subsequent treatment strategy for history of rectal cancer, status post chemotherapy and radiation therapy. Rising CEA. EXAM: NUCLEAR MEDICINE PET SKULL BASE TO THIGH TECHNIQUE: 5.8 mCi F-18 FDG was injected intravenously. Full-ring PET imaging was performed from the skull base to thigh after the radiotracer. CT data was obtained and used for attenuation correction and anatomic localization. Fasting blood glucose: 106 mg/dl COMPARISON:  Abdominopelvic CT 10/07/2021.  Chest CT 10/26/2021. FINDINGS: Mediastinal blood pool activity: SUV max 2.4 Liver activity: SUV max NA NECK: No areas of abnormal hypermetabolism. Incidental CT findings: No cervical adenopathy. Left carotid atherosclerosis. CHEST: No thoracic nodal hypermetabolism. The left lower lobe pulmonary nodule of interest measures 11 mm and a S.U.V. max of 9.7 on 30/7. Incidental CT findings: Deferred to recent diagnostic CT. Cardiomegaly accentuated by a pectus excavatum deformity. Aortic atherosclerosis. ABDOMEN/PELVIS: No abdominopelvic nodal hypermetabolism. Hypermetabolism corresponding to moderate relatively  long segment rectal wall thickening. Example at a S.U.V. max of 21.7 on 151/4. Incidental CT findings: Normal adrenal glands. Normal noncontrast appearance of the liver, gallbladder, pancreas, spleen, kidneys. Abdominal aortic atherosclerosis. Apparent bladder wall thickening in the setting of underdistention. SKELETON: No abnormal marrow activity. Incidental CT findings: none IMPRESSION: 1. Hypermetabolic left lower lobe pulmonary nodule with differential considerations of isolated pulmonary metastasis versus metachronous primary bronchogenic carcinoma. 2. Marked  hypermetabolism corresponding to residual rectal wall thickening. The extent of rectal hypermetabolism favors residual disease over radiation change. 3. No other evidence of hypermetabolic metastasis. 4. Apparent bladder wall thickening could be due to underdistention, radiation induced cystitis, or infectious cystitis. Consider correlation with urinalysis. Electronically Signed   By: Abigail Miyamoto M.D.   On: 11/25/2021 08:24     ASSESSMENT/PLAN:  Jillian Lutz is a 71 y.o. female with    1.  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 Silverio Decamp, path from ascending colon polyp and rectal mass both showed adenocarcinoma.  -Staging CT CAP on 12/06/20 showed: perirectal lymph node involvement; single nonspecific left pulmonary nodule.  -Despite strong recommendation, patient firmly and repeatedly declined IV chemo and surgery, due to the fair of being sick and not able to take time off from work  -she agreed to chemoRT with Xeloda, 01/04/21 - 02/14/21. She tolerated well. -she previously declined surveillance measures, however, she developed pencil-thin stool and decreased caliber of stool. Her CEA normalized after chemoRT but increased to 18.49 on 09/28/21. She agreed to work up, and CT AP on 10/07/21 showed enlargement of LLL nodule. -further work up with PET yesterday, 11/24/21, showed: hypermetabolic LLL nodule; marked hypermetabolism corresponding to residual rectal wall thickening; no other evidence of hypermetabolic metastasis. -At her last appointment, Dr. Jacklynn Bue discussed her options. Given the probable metastatic disease, Dr. Burr Medico recommend systemic chemotherapy first, if no further metastasis, consider surgical resection of the primary tumor, and oligo lung metastasis, or SBRT to the lung  metastasis.  Given the slow-growing of her tumor, there is decent chance of cure if she proceed with chemo and surgery.  Dr. Burr Medico recommended FOLFOX, CapeOx or FOLFIRI, but she again declines intravenous chemo.  Her husband is disabled, she is afraid of being sick and not able to work to support her family.  Her children are still in [place]. -After lengthy discussion, she agreed to start Xeloda if biopsy confirms malignancy. Her biopsy was on *** which showed ***.  - NGS on her biopsy to see if she is a candidate for targeted therapy. *** -Plan to start Xeloda ***.      2. Symptom Management: low appetite, difficulty sleeping, low hemoglobin -she was prescribed mirtazapine on 10/11/21 for appetite and sleep. -labs from 09/28/21 showed hgb 11, iron panel and ferritin WNL.  Anemia is likely related to her underlying malignancy.   3. Social support -She is here with her husband, moved from [place] in 2014.  Her children remain in [place], spanning ages 32-50 -Ms. 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 She has been approved for free drug replacement through [facility], documents in Epic. -She has Lawyer Janett Billow and Optometrist.    4.  HTN, DM, pre-existing neuropathy -  she is taking lisinopril, gabapentin, amlodipine, and metformin.     PLAN -Xeloda *** -will check if we can do NGS on her biopsy without her OOP cost ***       No orders of the defined types were placed in this encounter.    I spent {CHL ONC TIME VISIT - VZDGL:[id number]} counseling the patient face to face. The total time spent in the appointment was {CHL ONC TIME VISIT - JOACZ:[id number]}.  Decorey Wahlert L Janoah Menna, PA-C 12/12/21

## 2021-12-13 ENCOUNTER — Ambulatory Visit (HOSPITAL_COMMUNITY): Payer: Self-pay

## 2021-12-13 ENCOUNTER — Encounter (HOSPITAL_COMMUNITY): Payer: Self-pay | Admitting: Pulmonary Disease

## 2021-12-13 ENCOUNTER — Ambulatory Visit (HOSPITAL_COMMUNITY): Payer: Self-pay | Admitting: Certified Registered Nurse Anesthetist

## 2021-12-13 ENCOUNTER — Ambulatory Visit (HOSPITAL_BASED_OUTPATIENT_CLINIC_OR_DEPARTMENT_OTHER): Payer: Self-pay | Admitting: Certified Registered Nurse Anesthetist

## 2021-12-13 ENCOUNTER — Other Ambulatory Visit: Payer: Self-pay

## 2021-12-13 ENCOUNTER — Ambulatory Visit (HOSPITAL_COMMUNITY)
Admission: RE | Admit: 2021-12-13 | Discharge: 2021-12-13 | Disposition: A | Discharge status: Home / Self Care | Payer: Self-pay | Attending: Pulmonary Disease | Admitting: Pulmonary Disease

## 2021-12-13 ENCOUNTER — Encounter (HOSPITAL_COMMUNITY)
Admission: RE | Disposition: A | Discharge status: Home / Self Care | Payer: Self-pay | Source: Home / Self Care | Attending: Pulmonary Disease

## 2021-12-13 DIAGNOSIS — C20 Malignant neoplasm of rectum: Secondary | ICD-10-CM | POA: Insufficient documentation

## 2021-12-13 DIAGNOSIS — R911 Solitary pulmonary nodule: Secondary | ICD-10-CM | POA: Diagnosis present

## 2021-12-13 DIAGNOSIS — I10 Essential (primary) hypertension: Secondary | ICD-10-CM | POA: Insufficient documentation

## 2021-12-13 HISTORY — DX: Prediabetes: R73.03

## 2021-12-13 HISTORY — PX: BRONCHIAL NEEDLE ASPIRATION BIOPSY: SHX5106

## 2021-12-13 HISTORY — PX: BRONCHIAL BIOPSY: SHX5109

## 2021-12-13 HISTORY — PX: VIDEO BRONCHOSCOPY WITH RADIAL ENDOBRONCHIAL ULTRASOUND: SHX6849

## 2021-12-13 LAB — BASIC METABOLIC PANEL
Anion gap: 8 (ref 5–15)
BUN: 13 mg/dL (ref 8–23)
CO2: 26 mmol/L (ref 22–32)
Calcium: 8.9 mg/dL (ref 8.9–10.3)
Chloride: 106 mmol/L (ref 98–111)
Creatinine, Ser: 0.72 mg/dL (ref 0.44–1.00)
GFR, Estimated: >60 mL/min (ref >60)
Glucose, Bld: 107 mg/dL — ABNORMAL HIGH (ref 70–99)
Potassium: 3.6 mmol/L (ref 3.5–5.1)
Sodium: 140 mmol/L (ref 135–145)

## 2021-12-13 LAB — CBC
HCT: 37.7 % (ref 36.0–46.0)
Hemoglobin: 12.2 g/dL (ref 12.0–15.0)
MCH: 25.8 pg — ABNORMAL LOW (ref 26.0–34.0)
MCHC: 32.4 g/dL (ref 30.0–36.0)
MCV: 79.7 fL — ABNORMAL LOW (ref 80.0–100.0)
Platelets: 250 10*3/uL (ref 150–400)
RBC: 4.73 MIL/uL (ref 3.87–5.11)
RDW: 14.7 % (ref 11.5–15.5)
WBC: 6.2 10*3/uL (ref 4.0–10.5)
nRBC: 0 % (ref 0.0–0.2)

## 2021-12-13 SURGERY — BRONCHOSCOPY, WITH BIOPSY USING ELECTROMAGNETIC NAVIGATION
Anesthesia: General | Laterality: Left

## 2021-12-13 MED ORDER — LIDOCAINE 2% (20 MG/ML) 5 ML SYRINGE
INTRAMUSCULAR | Status: DC | PRN
  Administered 2021-12-13: 20 mg via INTRAVENOUS
  Administered 2021-12-13: 40 mg via INTRAVENOUS

## 2021-12-13 MED ORDER — PROPOFOL 500 MG/50ML IV EMUL
INTRAVENOUS | Status: DC | PRN
  Administered 2021-12-13: 100 ug/kg/min via INTRAVENOUS
  Administered 2021-12-13: 80 ug/kg/min via INTRAVENOUS

## 2021-12-13 MED ORDER — FENTANYL CITRATE (PF) 100 MCG/2ML IJ SOLN
INTRAMUSCULAR | Status: DC | PRN
  Administered 2021-12-13 (×2): 50 ug via INTRAVENOUS

## 2021-12-13 MED ORDER — PROPOFOL 10 MG/ML IV BOLUS
INTRAVENOUS | Status: DC | PRN
  Administered 2021-12-13: 90 mg via INTRAVENOUS
  Administered 2021-12-13: 10 mg via INTRAVENOUS

## 2021-12-13 MED ORDER — DEXAMETHASONE SODIUM PHOSPHATE 10 MG/ML IJ SOLN
INTRAMUSCULAR | Status: DC | PRN
  Administered 2021-12-13: 10 mg via INTRAVENOUS

## 2021-12-13 MED ORDER — ONDANSETRON HCL 4 MG/2ML IJ SOLN
INTRAMUSCULAR | Status: DC | PRN
  Administered 2021-12-13: 4 mg via INTRAVENOUS

## 2021-12-13 MED ORDER — NEOSTIGMINE METHYLSULFATE 10 MG/10ML IV SOLN
INTRAVENOUS | Status: DC | PRN
  Administered 2021-12-13: 3 mg via INTRAVENOUS

## 2021-12-13 MED ORDER — SUCCINYLCHOLINE CHLORIDE 200 MG/10ML IV SOSY
PREFILLED_SYRINGE | INTRAVENOUS | Status: DC | PRN
  Administered 2021-12-13: 100 mg via INTRAVENOUS

## 2021-12-13 MED ORDER — LACTATED RINGERS IV SOLN
INTRAVENOUS | Status: DC
Start: 2021-12-13 — End: 2021-12-13

## 2021-12-13 MED ORDER — GLYCOPYRROLATE 0.2 MG/ML IJ SOLN
INTRAMUSCULAR | Status: DC | PRN
  Administered 2021-12-13: .4 mg via INTRAVENOUS

## 2021-12-13 MED ORDER — ROCURONIUM BROMIDE 10 MG/ML (PF) SYRINGE
PREFILLED_SYRINGE | INTRAVENOUS | Status: DC | PRN
  Administered 2021-12-13: 25 mg via INTRAVENOUS

## 2021-12-13 NOTE — Anesthesia Procedure Notes (Signed)
Procedure Name: Intubation Date/Time: 12/13/2021 12:08 PM Performed by: Betha Loa, CRNA Pre-anesthesia Checklist: Patient identified, Emergency Drugs available, Suction available and Patient being monitored Patient Re-evaluated:Patient Re-evaluated prior to induction Oxygen Delivery Method: Circle System Utilized Preoxygenation: Pre-oxygenation with 100% oxygen Induction Type: IV induction Ventilation: Mask ventilation without difficulty Laryngoscope Size: Mac and 3 Grade View: Grade I Tube type: Oral Tube size: 8.5 mm Number of attempts: 1 Airway Equipment and Method: Stylet and Oral airway Placement Confirmation: ETT inserted through vocal cords under direct vision, positive ETCO2 and breath sounds checked- equal and bilateral Secured at: 19 cm Tube secured with: Tape Dental Injury: Teeth and Oropharynx as per pre-operative assessment

## 2021-12-13 NOTE — Op Note (Signed)
Video Bronchoscopy with Robotic Assisted Bronchoscopic Navigation   Date of Operation: 12/13/2021   Pre-op Diagnosis: Left lower lobe pulmonary nodule  Post-op Diagnosis: Left lower lobe pulmonary nodule  Surgeon: Garner Nash, DO   Assistants: None   Anesthesia: General endotracheal anesthesia  Operation: Flexible video fiberoptic bronchoscopy with robotic assistance and biopsies.  Estimated Blood Loss: Minimal  Complications: None  Indications and History: Jillian Lutz is a 71 y.o. female with history of rectal cancer, LLL nodule. The risks, benefits, complications, treatment options and expected outcomes were discussed with the patient.  The possibilities of pneumothorax, pneumonia, reaction to medication, pulmonary aspiration, perforation of a viscus, bleeding, failure to diagnose a condition and creating a complication requiring transfusion or operation were discussed with the patient who freely signed the consent.    Description of Procedure: The patient was seen in the Preoperative Area, was examined and was deemed appropriate to proceed.  The patient was taken to [facility] endoscopy room 3, identified as Jillian Lutz and the procedure verified as Flexible Video Fiberoptic Bronchoscopy.  A Time Out was held and the above information confirmed.   Prior to the date of the procedure a high-resolution CT scan of the chest was performed. Utilizing ION software program a virtual tracheobronchial tree was generated to allow the creation of distinct navigation pathways to the patient's parenchymal abnormalities. After being taken to the operating room general anesthesia was initiated and the patient  was orally intubated. The video fiberoptic bronchoscope was introduced via the endotracheal tube and a general inspection was performed which showed normal right and left lung anatomy, aspiration of the bilateral mainstems was completed to remove any remaining secretions. Robotic catheter inserted into  patient's endotracheal tube.   Target #1 LLL nodule : The distinct navigation pathways prepared prior to this procedure were then utilized to navigate to patient's lesion identified on CT scan. The robotic catheter was secured into place and the vision probe was withdrawn.  Lesion location was approximated using fluoroscopy, 3D CBCT imaging and radial endobronchial ultrasound for peripheral targeting. Under fluoroscopic guidance transbronchial needle brushings, transbronchial needle biopsies, and transbronchial forceps biopsies were performed to be sent for cytology and pathology.   At the end of the procedure a general airway inspection was performed and there was no evidence of active bleeding. The bronchoscope was removed.  The patient tolerated the procedure well. There was no significant blood loss and there were no obvious complications. A post-procedural chest x-ray is pending.  Samples Target #1: 1. Transbronchial needle brushings from LLL nodule  2. Transbronchial Wang needle biopsies from LLL 3. Transbronchial forceps biopsies from LLL  Plans:  The patient will be discharged from the PACU to home when recovered from anesthesia and after chest x-ray is reviewed. We will review the cytology, pathology results with the patient when they become available. Outpatient followup will be with Garner Nash, DO.    Garner Nash, DO [facility] 12/13/2021 1:17 PM

## 2021-12-13 NOTE — Anesthesia Postprocedure Evaluation (Signed)
Anesthesia Post Note  Patient: Jillian Lutz  Procedure(s) Performed: ROBOTIC ASSISTED NAVIGATIONAL BRONCHOSCOPY (Left) RADIAL ENDOBRONCHIAL ULTRASOUND BRONCHIAL BIOPSIES BRONCHIAL NEEDLE ASPIRATION BIOPSIES     Patient location during evaluation: PACU Anesthesia Type: General Level of consciousness: awake Pain management: pain level controlled Vital Signs Assessment: post-procedure vital signs reviewed and stable Respiratory status: spontaneous breathing, nonlabored ventilation, respiratory function stable and patient connected to nasal cannula oxygen Cardiovascular status: blood pressure returned to baseline and stable Postop Assessment: no apparent nausea or vomiting Anesthetic complications: no   No notable events documented.  Last Vitals:  Vitals:   12/13/21 1326 12/13/21 1351  BP: (!) 103/55 (!) 114/52  Pulse: (!) 55 (!) 51  Resp: 15 15  Temp:    SpO2: 98% 100%    Last Pain:  Vitals:   12/13/21 1043  TempSrc: Oral  PainSc:                  Jillian Lutz

## 2021-12-13 NOTE — Anesthesia Preprocedure Evaluation (Addendum)
Anesthesia Evaluation  Patient identified by MRN, date of birth, ID band Patient awake    Reviewed: Allergy & Precautions, NPO status , Patient's Chart, lab work & pertinent test results  History of Anesthesia Complications Negative for: history of anesthetic complications  Airway Mallampati: III  TM Distance: >3 FB Neck ROM: Full    Dental  (+) Teeth Intact, Dental Advisory Given   Pulmonary  Lung nodule   breath sounds clear to auscultation       Cardiovascular hypertension, Pt. on medications  Rhythm:Regular     Neuro/Psych neg Seizures Paresthesia of both lower extremities  Neuromuscular disease negative psych ROS   GI/Hepatic Neg liver ROS, Stomach cancer     Endo/Other  negative endocrine ROS  Renal/GU negative Renal ROS     Musculoskeletal negative musculoskeletal ROS (+)   Abdominal   Peds  Hematology negative hematology ROS (+) Lab Results      Component                Value               Date                      WBC                      6.2                 12/13/2021                HGB                      12.2                12/13/2021                HCT                      37.7                12/13/2021                MCV                      79.7 (L)            12/13/2021                PLT                      250                 12/13/2021              Anesthesia Other Findings lung nodule  Reproductive/Obstetrics                            Anesthesia Physical Anesthesia Plan  ASA: 2  Anesthesia Plan: General   Post-op Pain Management: Minimal or no pain anticipated   Induction: Intravenous  PONV Risk Score and Plan: 3 and Ondansetron, Dexamethasone, Treatment may vary due to age or medical condition, Propofol infusion and TIVA  Airway Management Planned: Oral ETT  Additional Equipment: None  Intra-op Plan:   Post-operative Plan: Extubation in  OR  Informed Consent: I have reviewed the patients History and Physical, chart, labs and discussed the procedure  including the risks, benefits and alternatives for the proposed anesthesia with the patient or authorized representative who has indicated his/her understanding and acceptance.     Dental advisory given and Consent reviewed with POA  Plan Discussed with: CRNA  Anesthesia Plan Comments:        Anesthesia Quick Evaluation

## 2021-12-13 NOTE — Discharge Instructions (Signed)
Flexible Bronchoscopy, Care After This sheet gives you information about how to care for yourself after your test. Your doctor may also give you more specific instructions. If you have problems or questions, contact your doctor. Follow these instructions at home: Eating and drinking Do not eat or drink anything (not even water) for 2 hours after your test, or until your numbing medicine (local anesthetic) wears off. When your numbness is gone and your cough and gag reflexes have come back, you may: Eat only soft foods. Slowly drink liquids. The day after the test, go back to your normal diet. Driving Do not drive for 24 hours if you were given a medicine to help you relax (sedative). Do not drive or use heavy machinery while taking prescription pain medicine. General instructions  Take over-the-counter and prescription medicines only as told by your doctor. Return to your normal activities as told. Ask what activities are safe for you. Do not use any products that have nicotine or tobacco in them. This includes cigarettes and e-cigarettes. If you need help quitting, ask your doctor. Keep all follow-up visits as told by your doctor. This is important. It is very important if you had a tissue sample (biopsy) taken. Get help right away if: You have shortness of breath that gets worse. You get light-headed. You feel like you are going to pass out (faint). You have chest pain. You cough up: More than a little blood. More blood than before. Summary Do not eat or drink anything (not even water) for 2 hours after your test, or until your numbing medicine wears off. Do not use cigarettes. Do not use e-cigarettes. Get help right away if you have chest pain.  This information is not intended to replace advice given to you by your health care provider. Make sure you discuss any questions you have with your health care provider. Document Released: 04/30/2009 Document Revised: 06/15/2017 Document  Reviewed: 07/21/2016 Elsevier Patient Education  2020 [employer].

## 2021-12-13 NOTE — H&P (Signed)
Synopsis: Referred in April 2023 for lung nodule by Alla Feeling, NP   Subjective:    PATIENT ID: Jillian Lutz GENDER: female DOB: 21-Oct-1950, MRN: [id number]       Chief Complaint  Patient presents with   Consult      Consult. Patient is here to talk about mass on left lung.       This is a 70 year old female, past medical history of stage III rectal adenocarcinoma.  She had ongoing surveillance imaging with medical oncology.  Patient was found to have a new left lower lobe lung nodule.  Patient was referred here for evaluation of the lung nodule and consideration for bioCT abdomen and pelvis completed on 10/07/2021 revealed a interval enlargement in the left lower lobe concerning for metastasis to the lung.psy.  From a respiratory standpoint she is doing okay.  She is present today in the office with interpreter.   12/13/2021: here today for bronchoscopy. Patient is agreeable to proceed.         Oncology History Overview Note   Cancer Staging Rectal adenocarcinoma [facility]) Staging form: Colon and Rectum, AJCC 8th Edition - Clinical stage from 12/21/2020: Stage IIIB (cT3, cN1, cM0) - Unsigned      Rectal adenocarcinoma ([place])   08/26/2020 Miscellaneous     Initial presentation to PCP, reporting intermittent BRBPR since 02/2020      12/01/2020 Procedure     Colonoscopy by Dr. Silverio Decamp findings - The perianal and digital rectal examinations were normal. - A 15 mm polyp was found in the ascending colon. The polyp was semi-pedunculated. Resection and retrieval were complete. - A 25 mm polyp was found in the sigmoid colon. The polyp was pedunculated. Resection and retrieval were complete. - An infiltrative partially obstructing large mass was found in the proximal rectum. The mass was partially circumferential (involving one-half of the lumen circumference). The mass measured eight cm in length extending from 10 to 18cm from anal verge. This was biopsied with a cold forceps for histology.  Proximal and distal opposite fold area of the mass lesion was tattooed with an injection of total 3 mL of Spot (carbon black).     12/01/2020 Initial Biopsy     Diagnosis 1. Colon, polyp(s), ascending x 1 - ADENOCARCINOMA ARISING IN A TUBULAR ADENOMA WITH HIGH-GRADE DYSPLASIA. SEE NOTE 2. Colon, sigmoid polyp, x1 - TUBULOVILLOUS ADENOMA(S) - NEGATIVE FOR HIGH-GRADE DYSPLASIA OR MALIGNANCY 3. Rectum, biopsy - ADENOCARCINOMA. SEE NOTE     12/01/2020 Cancer Staging     Cancer Staging Rectal adenocarcinoma [facility]) Staging form: Colon and Rectum, AJCC 8th Edition - Clinical stage from 12/21/2020: Stage IIIB (cT3, cN1, cM0) - Unsigned       12/06/2020 Imaging     CT CAP with contrast IMPRESSION: 1. There is partially circumferential soft tissue thickening of the mid to superior rectum, approximately 5 cm in length and the inferior extent approximately 6 cm above the anal verge, consistent with primary rectal malignancy identified by colonoscopy. 2. There appear to be abnormally enlarged perirectal lymph nodes posteriorly about the superior rectum measuring up to 1.0 x 0.8 cm. Findings are suspicious for perirectal nodal metastatic disease, however rectal MRI is the test of choice for initial local staging of rectal cancer. 3. There is a 4 mm nonspecific pulmonary nodule of the superior segment left lower lobe, statistically most likely incidental, infectious or inflammatory, although nonspecific and isolated metastatic disease is not strictly excluded. Attention on follow-up. 4. No other evidence of  metastatic disease in the chest, abdomen, or pelvis. Aortic Atherosclerosis (ICD10-I70.0).     12/09/2020 Imaging     Local staging MRI pelvis without contrast IMPRESSION: 4.9 cm circumferential mid/lower rectal mass, corresponding to the patient's newly diagnosed rectal cancer. Rectal adenocarcinoma T stage: T3c Rectal adenocarcinoma N stage:  N1 Distance from tumor to the internal anal  sphincter is 3.7 cm.     12/21/2020 Initial Diagnosis     Rectal adenocarcinoma ([place])     01/04/2021 -  Chemotherapy     Concurrent chemoradiation with Xeloda '1000mg'$  in the AM and '1500mg'$  in the PM on days of Radiation starting 01/04/21.          01/04/2021 - 02/11/2021 Radiation Therapy     Concurrent chemoradiation with Dr Lisbeth Renshaw and Xeloda starting 01/04/21.              Past Medical History:  Diagnosis Date   Bilateral wrist pain 10/25/2017   Diabetes mellitus without complication ([place])     Hypertension     Neck mass 1998    Unknown biopsy results. Excised in [place].   Trigger finger, acquired 02/08/2017           Family History  Problem Relation Age of Onset   Headache Son             Past Surgical History:  Procedure Laterality Date   NECK SURGERY Left 1998    Excision of mass in [place]      Social History         Socioeconomic History   Marital status: Married      Spouse name: Scientist, product/process development   Number of children: Not on file   Years of education: Not on file   Highest education level: Not on file  Occupational History   Not on file  Tobacco Use   Smoking status: Never   Smokeless tobacco: Never  Substance and Sexual Activity   Alcohol use: No   Drug use: No   Sexual activity: Not on file  Other Topics Concern   Not on file  Social History Narrative    Came to [place] from [place] in 2013.    Does not know much family history [place].    Social Determinants of Health       Financial Resource Strain: Not on file  Food Insecurity: Not on file  Transportation Needs: Not on file  Physical Activity: Not on file  Stress: Not on file  Social Connections: Not on file  Intimate Partner Violence: Not At Risk   Fear of Current or Ex-Partner: No   Emotionally Abused: No   Physically Abused: No   Sexually Abused: No      No Known Allergies          Outpatient Medications Prior to Visit   Medication Sig Dispense Refill   amLODipine (NORVASC) 5 MG tablet Take 1 tablet (5 mg total) by mouth daily. 90 tablet 3   gabapentin (NEURONTIN) 300 MG capsule Take 1 capsule (300 mg total) by mouth 3 (three) times daily. Take 1 pill at bedtime for 1 week, and then slowly increase to 3 times a day as needed. 90 capsule 2   lisinopril-hydrochlorothiazide (ZESTORETIC) 20-12.5 MG tablet Take 2 tablets by mouth daily. 60 tablet 3   metFORMIN (GLUCOPHAGE-XR) 500 MG 24 hr tablet Take 1 tablet (500 mg total) by mouth daily with breakfast. 30 tablet 5  mirtazapine (REMERON) 7.5 MG tablet Take 1 tablet (7.5 mg total) by mouth at bedtime. 60 tablet 2    No facility-administered medications prior to visit.   Review of Systems  Constitutional:  Negative for chills, fever, malaise/fatigue and weight loss.  HENT:  Negative for hearing loss, sore throat and tinnitus.   Eyes:  Negative for blurred vision and double vision.  Respiratory:  Negative for cough, hemoptysis, sputum production, shortness of breath, wheezing and stridor.   Cardiovascular:  Negative for chest pain, palpitations, orthopnea, leg swelling and PND.  Gastrointestinal:  Negative for abdominal pain, constipation, diarrhea, heartburn, nausea and vomiting.  Genitourinary:  Negative for dysuria, hematuria and urgency.  Musculoskeletal:  Negative for joint pain and myalgias.  Skin:  Negative for itching and rash.  Neurological:  Negative for dizziness, tingling, weakness and headaches.  Endo/Heme/Allergies:  Negative for environmental allergies. Does not bruise/bleed easily.  Psychiatric/Behavioral:  Negative for depression. The patient is not nervous/anxious and does not have insomnia.   All other systems reviewed and are negative.      Objective:   BP 138/72   Pulse 61   Temp 98.4 F (36.9 C) (Oral)   Resp 17   Ht '4\' 11"'$  (1.499 m)   Wt 52.7 kg   SpO2 98%   BMI 23.47 kg/m    General appearance: 71 y.o., female, NAD,  conversant  Eyes: anicteric sclerae, moist conjunctivae; no lid-lag; PERRLA, tracking appropriately HENT: NCAT; oropharynx, MMM, no mucosal ulcerations; normal hard and soft palate Neck: Trachea midline; FROM, supple, lymphadenopathy, no JVD Lungs: CTAB, no crackles, no wheeze, with normal respiratory effort and no intercostal retractions CV: RRR, S1, S2, no MRGs  Abdomen: Soft, non-tender; non-distended, BS present  Extremities: No peripheral edema, radial and DP pulses present bilaterally  Skin: Normal temperature, turgor and texture; no rash Psych: Appropriate affect Neuro: Alert and oriented to person and place, no focal deficit      CBC Labs (Brief)          Component Value Date/Time    WBC 5.3 09/28/2021 1100    RBC 4.22 09/28/2021 1100    HGB 11.0 (L) 09/28/2021 1100    HGB 11.4 08/26/2020 1108    HCT 33.0 (L) 09/28/2021 1100    HCT 36.4 08/26/2020 1108    PLT 227 09/28/2021 1100    PLT 247 08/26/2020 1108    MCV 78.2 (L) 09/28/2021 1100    MCV 81 08/26/2020 1108    MCH 26.1 09/28/2021 1100    MCHC 33.3 09/28/2021 1100    RDW 15.4 09/28/2021 1100    RDW 14.2 08/26/2020 1108    LYMPHSABS 0.8 09/28/2021 1100    LYMPHSABS 1.4 08/26/2020 1108    MONOABS 0.4 09/28/2021 1100    EOSABS 0.2 09/28/2021 1100    EOSABS 0.2 08/26/2020 1108    BASOSABS 0.0 09/28/2021 1100    BASOSABS 0.1 08/26/2020 1108         Chest Imaging: CT abdomen and pelvis 2023 March: Enlarging lung nodule in the left lung base concerning for pulmonary metastasis. The patient's images have been independently reviewed by me.     Pulmonary Functions Testing Results:       View : No data to display.             FeNO:    Pathology:    Echocardiogram:    Heart Catheterization:      Assessment & Plan:        ICD-10-CM  1. Lung nodule  R91.1 Procedural/ Surgical Case Request: ROBOTIC ASSISTED NAVIGATIONAL BRONCHOSCOPY      Ambulatory referral to Pulmonology      CT Super D Chest Wo  Contrast     2. Rectal adenocarcinoma ([place])  C20           Discussion:   This is a 71 year old female history of rectal adenocarcinoma, stage III now with enlarging left lower lobe pulmonary nodule concerning for pulmonary metastasis.  Patient was referred today for evaluation for bronchoscopy and biopsy  Plan: Here today for planned bronchoscopy  Discussed risks and benefits of procedure  Patient is agreeable to proceed.    Garner Nash, DO [facility] 12/13/2021 11:52 AM

## 2021-12-13 NOTE — Transfer of Care (Signed)
Immediate Anesthesia Transfer of Care Note  Patient: Jillian Lutz  Procedure(s) Performed: ROBOTIC ASSISTED NAVIGATIONAL BRONCHOSCOPY (Left) RADIAL ENDOBRONCHIAL ULTRASOUND BRONCHIAL BIOPSIES BRONCHIAL NEEDLE ASPIRATION BIOPSIES  Patient Location: PACU  Anesthesia Type:General  Level of Consciousness: drowsy, patient cooperative and responds to stimulation  Airway & Oxygen Therapy: Patient Spontanous Breathing and Patient connected to nasal cannula oxygen  Post-op Assessment: Report given to RN and Post -op Vital signs reviewed and stable  Post vital signs: Reviewed and stable  Last Vitals:  Vitals Value Taken Time  BP 125/55 12/13/21 1316  Temp    Pulse 53 12/13/21 1320  Resp 14 12/13/21 1320  SpO2 98 % 12/13/21 1320  Vitals shown include unvalidated device data.  Last Pain:  Vitals:   12/13/21 1043  TempSrc: Oral  PainSc:       Patients Stated Pain Goal: 0 (42/59/56 [id number])  Complications: No notable events documented.

## 2021-12-14 ENCOUNTER — Encounter (HOSPITAL_COMMUNITY): Payer: Self-pay | Admitting: Pulmonary Disease

## 2021-12-15 ENCOUNTER — Other Ambulatory Visit: Payer: Self-pay

## 2021-12-15 ENCOUNTER — Other Ambulatory Visit: Payer: Self-pay | Admitting: Physician Assistant

## 2021-12-15 DIAGNOSIS — C20 Malignant neoplasm of rectum: Secondary | ICD-10-CM

## 2021-12-15 DIAGNOSIS — K5900 Constipation, unspecified: Secondary | ICD-10-CM

## 2021-12-15 LAB — CYTOLOGY - NON PAP

## 2021-12-16 ENCOUNTER — Ambulatory Visit (HOSPITAL_COMMUNITY)
Admission: RE | Admit: 2021-12-16 | Discharge: 2021-12-16 | Disposition: A | Discharge status: Home / Self Care | Payer: Self-pay | Source: Ambulatory Visit | Attending: Physician Assistant | Admitting: Physician Assistant

## 2021-12-16 ENCOUNTER — Other Ambulatory Visit (HOSPITAL_COMMUNITY): Payer: Self-pay

## 2021-12-16 ENCOUNTER — Telehealth: Payer: Self-pay

## 2021-12-16 ENCOUNTER — Inpatient Hospital Stay (HOSPITAL_BASED_OUTPATIENT_CLINIC_OR_DEPARTMENT_OTHER): Payer: Self-pay | Admitting: Physician Assistant

## 2021-12-16 ENCOUNTER — Inpatient Hospital Stay: Payer: Self-pay | Attending: Physician Assistant

## 2021-12-16 ENCOUNTER — Other Ambulatory Visit: Payer: Self-pay

## 2021-12-16 VITALS — BP 131/80 | HR 67 | Temp 97.6°F | Resp 15 | Ht 59.0 in | Wt 115.0 lb

## 2021-12-16 DIAGNOSIS — R112 Nausea with vomiting, unspecified: Secondary | ICD-10-CM

## 2021-12-16 DIAGNOSIS — E1140 Type 2 diabetes mellitus with diabetic neuropathy, unspecified: Secondary | ICD-10-CM | POA: Insufficient documentation

## 2021-12-16 DIAGNOSIS — I10 Essential (primary) hypertension: Secondary | ICD-10-CM | POA: Insufficient documentation

## 2021-12-16 DIAGNOSIS — C20 Malignant neoplasm of rectum: Secondary | ICD-10-CM

## 2021-12-16 DIAGNOSIS — Z79899 Other long term (current) drug therapy: Secondary | ICD-10-CM | POA: Insufficient documentation

## 2021-12-16 DIAGNOSIS — K5900 Constipation, unspecified: Secondary | ICD-10-CM | POA: Insufficient documentation

## 2021-12-16 DIAGNOSIS — D649 Anemia, unspecified: Secondary | ICD-10-CM | POA: Insufficient documentation

## 2021-12-16 LAB — CMP (CANCER CENTER ONLY)
ALT: 6 U/L (ref 0–44)
AST: 16 U/L (ref 15–41)
Albumin: 3.7 g/dL (ref 3.5–5.0)
Alkaline Phosphatase: 52 U/L (ref 38–126)
Anion gap: 5 (ref 5–15)
BUN: 14 mg/dL (ref 8–23)
CO2: 30 mmol/L (ref 22–32)
Calcium: 9.2 mg/dL (ref 8.9–10.3)
Chloride: 103 mmol/L (ref 98–111)
Creatinine: 0.71 mg/dL (ref 0.44–1.00)
GFR, Estimated: >60 mL/min (ref >60)
Glucose, Bld: 124 mg/dL — ABNORMAL HIGH (ref 70–99)
Potassium: 3.8 mmol/L (ref 3.5–5.1)
Sodium: 138 mmol/L (ref 135–145)
Total Bilirubin: 0.4 mg/dL (ref 0.3–1.2)
Total Protein: 6.5 g/dL (ref 6.5–8.1)

## 2021-12-16 LAB — CBC WITH DIFFERENTIAL (CANCER CENTER ONLY)
Abs Immature Granulocytes: 0.03 10*3/uL (ref 0.00–0.07)
Basophils Absolute: 0.1 10*3/uL (ref 0.0–0.1)
Basophils Relative: 1 %
Eosinophils Absolute: 0.3 10*3/uL (ref 0.0–0.5)
Eosinophils Relative: 4 %
HCT: 33.6 % — ABNORMAL LOW (ref 36.0–46.0)
Hemoglobin: 11 g/dL — ABNORMAL LOW (ref 12.0–15.0)
Immature Granulocytes: 0 %
Lymphocytes Relative: 9 %
Lymphs Abs: 0.6 10*3/uL — ABNORMAL LOW (ref 0.7–4.0)
MCH: 25.2 pg — ABNORMAL LOW (ref 26.0–34.0)
MCHC: 32.7 g/dL (ref 30.0–36.0)
MCV: 77.1 fL — ABNORMAL LOW (ref 80.0–100.0)
Monocytes Absolute: 0.4 10*3/uL (ref 0.1–1.0)
Monocytes Relative: 6 %
Neutro Abs: 5.6 10*3/uL (ref 1.7–7.7)
Neutrophils Relative %: 80 %
Platelet Count: 240 10*3/uL (ref 150–400)
RBC: 4.36 MIL/uL (ref 3.87–5.11)
RDW: 14.6 % (ref 11.5–15.5)
WBC Count: 7 10*3/uL (ref 4.0–10.5)
nRBC: 0 % (ref 0.0–0.2)

## 2021-12-16 LAB — IRON AND IRON BINDING CAPACITY (CC-WL,HP ONLY)
Iron: 52 ug/dL (ref 28–170)
Saturation Ratios: 18 % (ref 10.4–31.8)
TIBC: 295 ug/dL (ref 250–450)
UIBC: 243 ug/dL (ref 148–442)

## 2021-12-16 LAB — CEA (IN HOUSE-CHCC): CEA (CHCC-In House): 31.56 ng/mL — ABNORMAL HIGH (ref 0.00–5.00)

## 2021-12-16 LAB — FERRITIN: Ferritin: 70 ng/mL (ref 11–307)

## 2021-12-16 MED ORDER — PROCHLORPERAZINE MALEATE 10 MG PO TABS
10.0000 mg | ORAL_TABLET | Freq: Four times a day (QID) | ORAL | 2 refills | Status: DC | PRN
  Filled 2021-12-16 – 2022-08-11 (×3): qty 30, 8d supply, fill #0

## 2021-12-16 NOTE — Telephone Encounter (Signed)
I have called and spoken with pts Congregational Nurse at [phone].   Per [employer], she has been advised it is not a good idea for this patient and to use Benefiber and should try magnesium citrate instead.  In the event this does not work we can send a prescription for lactulose. Also, she was given instructions for pt to get an abdominal xray before her visit tomorrow. Pt uses transportation and will not be able to come any earlier to get the xray before her appt. I have advised we will have her get it while she is here.

## 2021-12-19 ENCOUNTER — Telehealth: Payer: Self-pay

## 2021-12-19 ENCOUNTER — Telehealth: Payer: Self-pay | Admitting: Physician Assistant

## 2021-12-19 NOTE — Telephone Encounter (Signed)
Phone call from [facility] stating they were unable to reach patient to schedule appointment requested by [facility].  Appointment is for December 22, 2021 at 2:30 pm.  Called patient to inform her of appointment.  She stated she is feeling much better and wants to cancel and if symptoms return she will reschedule.  CN will inform provider.  Jake Michaelis RN, Congregational Nurse [phone]

## 2021-12-19 NOTE — Telephone Encounter (Signed)
I received a message from [name], the patient's congressional nurse that stated the following: Hello, Wanted to let you know I received a call from [facility] regarding an appointment for Mrs. Gum on 12/22/21. When I called Mrs. Herald she stated she is feeling much better and wants to reschedule the appointment at a later time if symptoms recur. Thank you so much for all you did for her. She much appreciates your concern and felt like you really listened to her. Jake Michaelis RN, [phone]  [name] and I then spoke on the phone regarding this patient. Discussed concern for residual tumor, especially considering her changes in bowel habits and increased constipation. Worried if she does not get this evaluated in the future, that she can develop bowel obstruction if there is residual disease. I asked that she strongly encourage the patient to please re-consider keeping the appointment with GI on 12/22/21. She is in agreement and will pass these concerns on to the patient. She will let me know what the patient says. Dr. Burr Medico is hoping to see the patient back in 2-3 weeks after the patient is evaluated by GI to discuss next steps.

## 2021-12-19 NOTE — Telephone Encounter (Signed)
Oral Chemotherapy Pharmacist Encounter   Per office visit note on 12/16/21 patient not resuming Xeloda (capecitabine) at this time. Will close encounters at this time.   Leron Croak, PharmD, BCPS Hematology/Oncology Clinical Pharmacist [facility] [phone] 12/19/2021 8:09 AM

## 2021-12-20 ENCOUNTER — Other Ambulatory Visit (HOSPITAL_COMMUNITY): Payer: Self-pay

## 2021-12-20 NOTE — Congregational Nurse Program (Signed)
Talked with patient with assistance from [facility] interpreter regarding her request to cancel appointment with GI doctor because she is feeling better and having normal BM"s. We told her concerns of Cassie PA with Dr. Burr Medico.  She agreed to keep appointment scheduled for 12/22/2021 @ 2:30 pm with [facility].  Also stated she is almost out of medicine. Will call in refill request to [facility] out-patient pharmacy and deliver to patient tomorrow. We also reminded her of follow-up appointment on 12/23/2021 with [facility].  Jake Michaelis RN, Congregational Nurse [phone]

## 2021-12-21 ENCOUNTER — Other Ambulatory Visit: Payer: Self-pay

## 2021-12-21 ENCOUNTER — Encounter: Payer: Self-pay | Admitting: Surgery

## 2021-12-21 DIAGNOSIS — K624 Stenosis of anus and rectum: Secondary | ICD-10-CM | POA: Insufficient documentation

## 2021-12-21 DIAGNOSIS — Z789 Other specified health status: Secondary | ICD-10-CM | POA: Insufficient documentation

## 2021-12-21 NOTE — Progress Notes (Signed)
The proposed treatment discussed in conference is for discussion purpose only and is not a binding recommendation.  The patients have not been physically examined, or presented with their treatment options.  Therefore, final treatment plans cannot be decided.  

## 2021-12-21 NOTE — Congregational Nurse Program (Signed)
Home visit with interpreter [name] assisting via phone. Delivered patient's medications which were picked up from [employer].  Reminded her of appointment tomorrow with [facility] and on Friday 12/23/2021 with [facility] with Dr. Valeta Harms. Transportation has been requested thru [facility]. Given note for her job that she will be late for work tomorrow due to 2:30 appointment.   She states she is now having normal BM's and abdominal pain has decreased by about 50%.  She attributes improvement to herbs she is using.  Stated that none of the OTC medicines helped constipation.  Jake Michaelis RN, Congregational Nurse [phone]

## 2021-12-22 ENCOUNTER — Ambulatory Visit (INDEPENDENT_AMBULATORY_CARE_PROVIDER_SITE_OTHER): Payer: Self-pay | Admitting: Physician Assistant

## 2021-12-22 ENCOUNTER — Encounter: Payer: Self-pay | Admitting: *Deleted

## 2021-12-22 ENCOUNTER — Other Ambulatory Visit (HOSPITAL_COMMUNITY): Payer: Self-pay

## 2021-12-22 VITALS — BP 114/66 | HR 67 | Ht <= 58 in | Wt 114.4 lb

## 2021-12-22 DIAGNOSIS — Z85048 Personal history of other malignant neoplasm of rectum, rectosigmoid junction, and anus: Secondary | ICD-10-CM

## 2021-12-22 DIAGNOSIS — R194 Change in bowel habit: Secondary | ICD-10-CM

## 2021-12-22 MED ORDER — NA SULFATE-K SULFATE-MG SULF 17.5-3.13-1.6 GM/177ML PO SOLN
1.0000 | ORAL | 0 refills | Status: DC
Start: 2021-12-22 — End: 2022-01-31

## 2021-12-22 MED ORDER — NA SULFATE-K SULFATE-MG SULF 17.5-3.13-1.6 GM/177ML PO SOLN
1.0000 | ORAL | 0 refills | Status: DC
  Filled 2021-12-22: qty 354, 1d supply, fill #0

## 2021-12-22 NOTE — Addendum Note (Signed)
Addended by: Larina Bras on: 12/22/2021 04:01 PM   Modules accepted: Orders

## 2021-12-22 NOTE — Patient Instructions (Signed)
You have been scheduled for a colonoscopy. Please follow written instructions given to you at your visit today.  Please pick up your prep supplies at the pharmacy within the next 1-3 days. If you use inhalers (even only as needed), please bring them with you on the day of your procedure.  If you are age 71 or older, your body mass index should be between 23-30. Your Body mass index is 23.91 kg/m. If this is out of the aforementioned range listed, please consider follow up with your Primary Care Provider.  If you are age 75 or younger, your body mass index should be between 19-25. Your Body mass index is 23.91 kg/m. If this is out of the aformentioned range listed, please consider follow up with your Primary Care Provider.   _____________________________________________________  The  GI providers would like to encourage you to use [facility] to communicate with providers for non-urgent requests or questions.  Due to long hold times on the telephone, sending your provider a message by [facility] may be a faster and more efficient way to get a response.  Please allow 48 business hours for a response.  Please remember that this is for non-urgent requests.  _____________________________________________________ Due to recent changes in healthcare laws, you may see the results of your imaging and laboratory studies on MyChart before your provider has had a chance to review them.  We understand that in some cases there may be results that are confusing or concerning to you. Not all laboratory results come back in the same time frame and the provider may be waiting for multiple results in order to interpret others.  Please give [place] 48 hours in order for your provider to thoroughly review all the results before contacting the office for clarification of your results.

## 2021-12-22 NOTE — Progress Notes (Signed)
Chief Complaint: Constipation, history of rectal cancer  HPI:    Jillian Lutz is a 71 year old [place] female, known to Dr. Silverio Decamp, who was referred to me by Heilingoetter, Cassandr* for a complaint of constipation and a history of rectal cancer.      12/01/2020 colonoscopy with one 15 mm polyp in the ascending colon and 125 mm polyp in the sigmoid colon, likely malignant partially obstructing tumor in the proximal rectum.  Repeat colonoscopy recommended 1 year and patient was set up for staging CT of the chest, abdomen and pelvis.  Pathology showed adenocarcinoma from the rectal mass and also the ascending colon polyp.    12/06/2020 CT of the chest abdomen pelvis with contrast showed partially circumferential soft tissue thickening of the mid to superior rectum, approximately 5 cm in length and the inferior extent approximately 6 cm above the anal verge, consistent with primary rectal malignancy identified by colonoscopy.  Also abnormally enlarged perirectal lymph nodes posteriorly about the superior rectum measuring 1 x 0.8 cm.  Findings are suspicious for perirectal nodal met metastatic disease as well as a 4 mm nonspecific pulmonary nodule in superior segment of the left lower lobe.    12/11/2020 MRI of the pelvis showed 4.9 cm circumferential mid/lower rectal mass corresponding to patient's newly diagnosed rectal cancer and rectal adenocarcinoma 6T stage T3c and end stage N1.    Patient then followed at the cancer center and underwent radiation.    10/07/2021 CT the abdomen pelvis with contrast with diminished circumferential wall thickening of the mid rectum, previously seen perirectal lymph nodes are resolved, findings consistent with treatment response.  New perirectal and presacral fat stranding and soft tissue thickening consistent with radiation change.  Significant interval enlargement of the nodule of the dependent superior segment left lower lobe consistent with a large pulmonary metastasis.     10/26/2021 CT of the chest with 10.5 mm left lower lobe pulmonary nodule.    11/24/2021 PET scan showed hypermetabolic left lower lobe pulmonary nodule, marked hypermetabolism corresponding to the residual rectal wall thickening as well as apparent bladder wall thickening.    12/16/2021 CBC with a hemoglobin of 11, CEA elevated at 31.56.  Iron studies normal.  CMP normal.    Today, the patient presents to clinic accompanied by an interpreter and her nurse.  She explains that she had a change in bowel habits toward constipation and is having a lot of lower abdominal discomfort but she is 80% better now.  Apparently tried over-the-counter MiraLAX, Dulcolax and Colace all which did not really help as well as magnesium citrate and then eventually just started some "[place] herbs" (they show me a picture of the herbs in a big pot which she boils down but are unaware of what any of the names are), she takes these every day and is having a daily bowel movement.  She still feels like "something is on the inside".  Denies any rectal bleeding.    Denies fever, chills, weight loss or symptoms that awaken her from sleep.  Past Medical History:  Diagnosis Date   Bilateral wrist pain 10/25/2017   Hypertension    Neck mass 1998   Unknown biopsy results. Excised in [place].   Pre-diabetes    Rectal cancer ([place])    Trigger finger, acquired 02/08/2017    Past Surgical History:  Procedure Laterality Date   BRONCHIAL BIOPSY  12/13/2021   Procedure: BRONCHIAL BIOPSIES;  Surgeon: Garner Nash, DO;  Location: [facility];  Service: Pulmonary;;  BRONCHIAL NEEDLE ASPIRATION BIOPSY  12/13/2021   Procedure: BRONCHIAL NEEDLE ASPIRATION BIOPSIES;  Surgeon: Garner Nash, DO;  Location: [facility];  Service: Pulmonary;;   NECK SURGERY Left 1998   Excision of mass in [place]   VIDEO BRONCHOSCOPY WITH RADIAL ENDOBRONCHIAL ULTRASOUND  12/13/2021   Procedure: RADIAL ENDOBRONCHIAL ULTRASOUND;  Surgeon: Garner Nash, DO;  Location: [facility];  Service: Pulmonary;;    Current Outpatient Medications  Medication Sig Dispense Refill   acetaminophen (TYLENOL) 500 MG tablet Take 500 mg by mouth daily.     amLODipine (NORVASC) 5 MG tablet Take 1 tablet (5 mg total) by mouth daily. 90 tablet 3   capecitabine (XELODA) 500 MG tablet Take 3 tablets (1,500 mg total) by mouth 2 (two) times daily after a meal. Take for 14 days on, 7 days off. Repeat every 21 days. 84 tablet 1   gabapentin (NEURONTIN) 100 MG capsule Take 1 capsule (100 mg total) by mouth 3 (three) times daily as needed. (Patient taking differently: Take 100 mg by mouth daily.) 90 capsule 2   lisinopril-hydrochlorothiazide (ZESTORETIC) 20-12.5 MG tablet Take 2 tablets by mouth daily. 60 tablet 3   Multiple Vitamins-Minerals (MULTIVITAMIN WITH MINERALS) tablet Take 1 tablet by mouth daily. Gummy 50 mg     prochlorperazine (COMPAZINE) 10 MG tablet Take 1 tablet (10 mg total) by mouth every 6 (six) hours as needed. 30 tablet 2   No current facility-administered medications for this visit.    Allergies as of 12/22/2021   (No Known Allergies)    Family History  Problem Relation Age of Onset   Headache Son     Social History   Socioeconomic History   Marital status: Married    Spouse name: Scientist, product/process development   Number of children: Not on file   Years of education: Not on file   Highest education level: Not on file  Occupational History   Not on file  Tobacco Use   Smoking status: Never   Smokeless tobacco: Never  Vaping Use   Vaping Use: Never used  Substance and Sexual Activity   Alcohol use: No   Drug use: No   Sexual activity: Not on file  Other Topics Concern   Not on file  Social History Narrative   Came to [place] from [place] in 2013.   Does not know much family history [place].   Social Determinants of Health   Financial Resource Strain: Not on file  Food  Insecurity: Not on file  Transportation Needs: Not on file  Physical Activity: Not on file  Stress: Not on file  Social Connections: Not on file  Intimate Partner Violence: Not At Risk (12/22/2020)   Humiliation, Afraid, Rape, and Kick questionnaire    Fear of Current or Ex-Partner: No    Emotionally Abused: No    Physically Abused: No    Sexually Abused: No    Review of Systems:    Constitutional: No weight loss, fever or chills Cardiovascular: No chest pain Respiratory: No SOB Gastrointestinal: See HPI and otherwise negative   Physical Exam:  Vital signs: BP 114/66   Pulse 67   Ht _0  (1.473 m)   Wt 114 lb 6.4 oz (51.9 kg)   SpO2 98%   BMI 23.91 kg/m    Constitutional:   Pleasant [place] female appears to be in NAD, Well developed, Well nourished, alert and cooperative Respiratory: Respirations even and  unlabored. Lungs clear to auscultation bilaterally.   No wheezes, crackles, or rhonchi.  Cardiovascular: Normal S1, S2. No MRG. Regular rate and rhythm. No peripheral edema, cyanosis or pallor.  Gastrointestinal:  Soft, nondistended, nontender. No rebound or guarding. Normal bowel sounds. No appreciable masses or hepatomegaly. Rectal:  Not performed.  Psychiatric:  Demonstrates good judgement and reason without abnormal affect or behaviors.  RELEVANT LABS AND IMAGING: CBC    Component Value Date/Time   WBC 7.0 12/16/2021 0933   WBC 6.2 12/13/2021 1115   RBC 4.36 12/16/2021 0933   HGB 11.0 (L) 12/16/2021 0933   HGB 11.4 08/26/2020 1108   HCT 33.6 (L) 12/16/2021 0933   HCT 36.4 08/26/2020 1108   PLT 240 12/16/2021 0933   PLT 247 08/26/2020 1108   MCV 77.1 (L) 12/16/2021 0933   MCV 81 08/26/2020 1108   MCH 25.2 (L) 12/16/2021 0933   MCHC 32.7 12/16/2021 0933   RDW 14.6 12/16/2021 0933   RDW 14.2 08/26/2020 1108   LYMPHSABS 0.6 (L) 12/16/2021 0933   LYMPHSABS 1.4 08/26/2020 1108   MONOABS 0.4 12/16/2021 0933   EOSABS 0.3 12/16/2021 0933   EOSABS 0.2  08/26/2020 1108   BASOSABS 0.1 12/16/2021 0933   BASOSABS 0.1 08/26/2020 1108    CMP     Component Value Date/Time   NA 138 12/16/2021 0933   NA 139 09/29/2019 1406   K 3.8 12/16/2021 0933   CL 103 12/16/2021 0933   CO2 30 12/16/2021 0933   GLUCOSE 124 (H) 12/16/2021 0933   BUN 14 12/16/2021 0933   BUN 12 09/29/2019 1406   CREATININE 0.71 12/16/2021 0933   CALCIUM 9.2 12/16/2021 0933   PROT 6.5 12/16/2021 0933   PROT 6.6 06/22/2016 1004   ALBUMIN 3.7 12/16/2021 0933   ALBUMIN 4.1 06/22/2016 1004   AST 16 12/16/2021 0933   ALT 6 12/16/2021 0933   ALKPHOS 52 12/16/2021 0933   BILITOT 0.4 12/16/2021 0933   GFRNONAA >60 12/16/2021 0933   GFRAA 88 09/29/2019 1406    Assessment: 1.  Change in bowel habits: Towards constipation recently, consider relation to below 2.  History of rectal cancer: Diagnosed last year, now status post chemo and radiation, recent PET scan with continued area of concern in her rectum  Plan: 1.  Patient can continue herbs for now as nothing over-the-counter seems to help her. 2.  Initially patient was told we wanted to schedule her for colonoscopy next Tuesday with Dr. Silverio Decamp due to the severity of her rectal cancer and how delaying procedure could affect her care as well as how her cancer could hypothetically spread in the interim.  It could also give her further symptoms.  Patient verbalized understanding to all of the reasoning but tells me that she has "family concerns" and wants to postpone the procedure for a month.  She was scheduled with Dr. Silverio Decamp a month from now.  Did provide the patient a detailed list of risks for the procedure and she agrees to proceed. 3.  Patient to follow in clinic per recommendations after above.  Ellouise Newer, PA-C [facility] 12/22/2021, 2:23 PM  Cc: Heilingoetter, Cassandr*

## 2021-12-23 ENCOUNTER — Telehealth: Payer: Self-pay | Admitting: Radiation Oncology

## 2021-12-23 ENCOUNTER — Encounter: Payer: Self-pay | Admitting: Acute Care

## 2021-12-23 ENCOUNTER — Ambulatory Visit: Payer: Self-pay | Admitting: Acute Care

## 2021-12-23 ENCOUNTER — Ambulatory Visit (INDEPENDENT_AMBULATORY_CARE_PROVIDER_SITE_OTHER): Payer: Self-pay | Admitting: Acute Care

## 2021-12-23 VITALS — BP 118/66 | HR 61 | Temp 98.2°F | Ht 60.0 in | Wt 112.4 lb

## 2021-12-23 DIAGNOSIS — C189 Malignant neoplasm of colon, unspecified: Secondary | ICD-10-CM

## 2021-12-23 DIAGNOSIS — R911 Solitary pulmonary nodule: Secondary | ICD-10-CM

## 2021-12-23 DIAGNOSIS — C20 Malignant neoplasm of rectum: Secondary | ICD-10-CM

## 2021-12-23 NOTE — Patient Instructions (Addendum)
It is good to see you today. We will refer you to radiation oncology  The appointment with Dr. Lisbeth Renshaw needs to be co-ordinate with Jillian Lutz (congregational nurse) can be reached by cell at [phone]. He will discuss the option of radiation therapy to the lung nodules . If you change your mind about repeating the biopsy, please let the office  know, and we will place an order.  Please Remember, delaying treatment is not in your best interest. Please contact office for sooner follow up if symptoms do not improve or worsen or seek emergency care     Chng ti s? gi?i thi?u b?n ??n khoa ung th? b?c x? Cu?c h?n v?i Ti?n s? [place] c?n ???c ph?i h?p v?i Jillian Lutz (y t h?i ??ng) c th? lin h? qua ?i?n tho?i di ??ng t?i [phone]. Anh ?y s? th?o lu?n v? l?a ch?n x? tr? cho cc n?t ph?i. N?u b?n ??i  v? vi?c l?p l?i sinh thi?t, vui lng cho v?n phng bi?t v chng ti s? ??t hng. Hy nh? r?ng, tr hon ?i?u tr? khng ph?i l l?i ch t?t nh?t c?a b?n. Vui lng lin h? v?i v?n phng ?? ???c theo di s?m h?n n?u cc tri?u ch?ng khng c?i thi?n ho?c x?u ?i ho?c tm ki?m s? ch?m [place] kh?n c?p

## 2021-12-23 NOTE — Telephone Encounter (Signed)
6/9 @ 11:07 am Left voicemail with pt's interpreter to call our office for pt to be schedule for a consult with Shona Simpson.

## 2021-12-23 NOTE — Progress Notes (Signed)
History of Present Illness Jillian Lutz is a 71 y.o. female with past medical history of stage III rectal adenocarcinoma.  She had ongoing surveillance imaging with medical oncology.  Patient was found to have a new left lower lobe lung nodule.  Patient was referred to the Pulmonary office  for evaluation of the lung nodule.  CT abdomen and pelvis completed on 10/07/2021  which revealed a interval enlargement in the left lower lobe concerning for metastasis to the lung vs metachronous primary bronchogenic carcinoma.She was referred to Dr. Valeta Harms for Bronchoscopy and tissue sampling.    12/23/2021 Pt. Presents for follow up after bronchoscopy. She is here with interpreter, and all communication is through the interpreter. She underwent Flexible video fiberoptic bronchoscopy with robotic assistance and biopsies on 12/13/2021. She states she did have a sore throat after the procedure, but this has self resolved. . She did have some hemoptysis 1-2 days after the procedure. This has cleared. She denies any fever. No concern for aspiration.  We have discussed that her biopsy results from the bronch were negative for malignancy. There is however very high suspicion that the nodules  in the lung are metastasis from her rectal/ colon cancer vs a new bronchogenic cancer. as her CEA is rising and PET scan shows a hypermetabolic left lower lobe nodule that is concerning for isolated pulmonary mets vs. Metastatic disease. We discussed that we can repeat the biopsy to get a definitive diagnosis, but through IR and a  CT guided approach. She is not interested in another biopsy. She is in agreement with considering radiation oncology if Dr. Lisbeth Renshaw will consider treatment  without tissue sampling. We had a long discussion about delaying her treatments. She is scheduled for colonoscopy 01/31/2022, and stated through the interpreter that she would wait until after that procedure for any treatment of her lung nodules. I explained  that it is our recommendation that she seek treatment as soon as possible, but we do recognize that it is her choice. We did discuss the risks or delaying treatment through her interpreter to include spread of disease, and made sure she understood that earlier treatment is better to avoid further spread of disease. She is very concerned about missing work. . She verbalized understanding, again , through her interpreter. She has no pulmonary complaints.She has follow up with Dr. Burr Medico 12/30/21 and them colonoscopy 01/31/22. We have placed referral to radiation oncology, appointment will be made through her congregational nurse, Archie Balboa , who makes all of her appointments for her.   Test Results: Bronchoscopy Cytology 12/13/2021 FINAL MICROSCOPIC DIAGNOSIS:  A. LUNG, LLL, FINE NEEDLE ASPIRATION AND BIOPSIES:  - Negative for malignancy  - Benign bronchial cells and pulmonary macrophages  - Benign bronchial mucosa and alveolated lung (ThinPrep and cell block)   9/37/9024 Hypermetabolic left lower lobe pulmonary nodule with differential considerations of isolated pulmonary metastasis versus metachronous primary bronchogenic carcinoma. 2. Marked hypermetabolism corresponding to residual rectal wall thickening. The extent of rectal hypermetabolism favors residual disease over radiation change. 3. No other evidence of hypermetabolic metastasis. 4. Apparent bladder wall thickening could be due to underdistention, radiation induced cystitis, or infectious cystitis. Consider correlation with urinalysis.  Oncology History Overview Note    Cancer Staging Rectal adenocarcinoma [facility]) Staging form: Colon and Rectum, AJCC 8th Edition - Clinical stage from 12/21/2020: Stage IIIB (cT3, cN1, cM0) - Unsigned      Rectal adenocarcinoma ([place])    08/26/2020 Miscellaneous      Initial presentation to  PCP, reporting intermittent BRBPR since 02/2020       12/01/2020 Procedure      Colonoscopy by Dr. Silverio Decamp  findings - The perianal and digital rectal examinations were normal. - A 15 mm polyp was found in the ascending colon. The polyp was semi-pedunculated. Resection and retrieval were complete. - A 25 mm polyp was found in the sigmoid colon. The polyp was pedunculated. Resection and retrieval were complete. - An infiltrative partially obstructing large mass was found in the proximal rectum. The mass was partially circumferential (involving one-half of the lumen circumference). The mass measured eight cm in length extending from 10 to 18cm from anal verge. This was biopsied with a cold forceps for histology. Proximal and distal opposite fold area of the mass lesion was tattooed with an injection of total 3 mL of Spot (carbon black).      12/01/2020 Initial Biopsy      Diagnosis 1. Colon, polyp(s), ascending x 1 - ADENOCARCINOMA ARISING IN A TUBULAR ADENOMA WITH HIGH-GRADE DYSPLASIA. SEE NOTE 2. Colon, sigmoid polyp, x1 - TUBULOVILLOUS ADENOMA(S) - NEGATIVE FOR HIGH-GRADE DYSPLASIA OR MALIGNANCY 3. Rectum, biopsy - ADENOCARCINOMA. SEE NOTE      12/01/2020 Cancer Staging      Cancer Staging Rectal adenocarcinoma [facility]) Staging form: Colon and Rectum, AJCC 8th Edition - Clinical stage from 12/21/2020: Stage IIIB (cT3, cN1, cM0) - Unsigned        12/06/2020 Imaging      CT CAP with contrast IMPRESSION: 1. There is partially circumferential soft tissue thickening of the mid to superior rectum, approximately 5 cm in length and the inferior extent approximately 6 cm above the anal verge, consistent with primary rectal malignancy identified by colonoscopy. 2. There appear to be abnormally enlarged perirectal lymph nodes posteriorly about the superior rectum measuring up to 1.0 x 0.8 cm. Findings are suspicious for perirectal nodal metastatic disease, however rectal MRI is the test of choice for initial local staging of rectal cancer. 3. There is a 4 mm nonspecific pulmonary nodule of the  superior segment left lower lobe, statistically most likely incidental, infectious or inflammatory, although nonspecific and isolated metastatic disease is not strictly excluded. Attention on follow-up. 4. No other evidence of metastatic disease in the chest, abdomen, or pelvis. Aortic Atherosclerosis (ICD10-I70.0).      12/09/2020 Imaging      Local staging MRI pelvis without contrast IMPRESSION: 4.9 cm circumferential mid/lower rectal mass, corresponding to the patient's newly diagnosed rectal cancer. Rectal adenocarcinoma T stage: T3c Rectal adenocarcinoma N stage:  N1 Distance from tumor to the internal anal sphincter is 3.7 cm.      12/21/2020 Initial Diagnosis      Rectal adenocarcinoma ([place])      01/04/2021 -  Chemotherapy      Concurrent chemoradiation with Xeloda 1061m in the AM and 15049min the PM on days of Radiation starting 01/04/21.           01/04/2021 - 02/11/2021 Radiation Therapy      Concurrent chemoradiation with Dr MoLisbeth Renshawnd Xeloda starting 01/04/21.                  Past Medical History:  Diagnosis Date   Bilateral wrist pain 10/25/2017   Diabetes mellitus without complication ([facility]    Hypertension     Neck mass 1998    Unknown biopsy results. [place]cised [place]  Trigger finger, acquired 02/08/2017  Family History  Problem Relation Age of Onset   Headache Son                 Past Surgical History:  Procedure Laterality Date   NECK SURGERY Left 1998    Excision of mass in [place]        Latest Ref Rng & Units 12/16/2021    9:33 AM 12/13/2021   11:15 AM 09/28/2021   11:00 AM  CBC  WBC 4.0 - 10.5 K/uL 7.0  6.2  5.3   Hemoglobin 12.0 - 15.0 g/dL 11.0  12.2  11.0   Hematocrit 36.0 - 46.0 % 33.6  37.7  33.0   Platelets 150 - 400 K/uL 240  250  227        Latest Ref Rng & Units 12/16/2021    9:33 AM 12/13/2021   11:15 AM 09/28/2021   11:00 AM  BMP  Glucose 70 - 99 mg/dL 124  107  129   BUN 8 - 23 mg/dL '14  13  17   ' Creatinine  0.44 - 1.00 mg/dL 0.71  0.72  0.75   Sodium 135 - 145 mmol/L 138  140  141   Potassium 3.5 - 5.1 mmol/L 3.8  3.6  3.7   Chloride 98 - 111 mmol/L 103  106  107   CO2 22 - 32 mmol/L '30  26  30   ' Calcium 8.9 - 10.3 mg/dL 9.2  8.9  9.2     BNP No results found for: "BNP"  ProBNP No results found for: "PROBNP"  PFT No results found for: "FEV1PRE", "FEV1POST", "FVCPRE", "FVCPOST", "TLC", "DLCOUNC", "PREFEV1FVCRT", "PSTFEV1FVCRT"  DG Abd 2 Views  Result Date: 12/16/2021 CLINICAL DATA:  History of colorectal cancer, abdominal pain and constipation EXAM: ABDOMEN - 2 VIEW COMPARISON:  None Available. FINDINGS: Bowel-gas pattern is within normal limits. No air-fluid levels. No free air. Moderate stool burden. Acute osseous abnormality. IMPRESSION: Nonobstructive bowel gas pattern. Electronically Signed   By: Macy Mis M.D.   On: 12/16/2021 10:16   DG C-ARM BRONCHOSCOPY  Result Date: 12/13/2021 C-ARM BRONCHOSCOPY: Fluoroscopy was utilized by the requesting physician.  No radiographic interpretation.   DG Chest Port 1 View  Result Date: 12/13/2021 CLINICAL DATA:  Status post bronch with biopsy EXAM: PORTABLE CHEST 1 VIEW COMPARISON:  Chest x-ray dated February 16, 2017 FINDINGS: Cardiac and mediastinal contours within normal limits for for technique. Left basilar opacity, likely post bronchoscopy changes. No evidence of pleural effusion or pneumothorax. IMPRESSION: No evidence of pneumothorax status post bronchoscopy. Electronically Signed   By: Yetta Glassman M.D.   On: 12/13/2021 14:42   NM PET Image Initial (PI) Skull Base To Thigh  Result Date: 11/25/2021 CLINICAL DATA:  Subsequent treatment strategy for history of rectal cancer, status post chemotherapy and radiation therapy. Rising CEA. EXAM: NUCLEAR MEDICINE PET SKULL BASE TO THIGH TECHNIQUE: 5.8 mCi F-18 FDG was injected intravenously. Full-ring PET imaging was performed from the skull base to thigh after the radiotracer. CT data was  obtained and used for attenuation correction and anatomic localization. Fasting blood glucose: 106 mg/dl COMPARISON:  Abdominopelvic CT 10/07/2021.  Chest CT 10/26/2021. FINDINGS: Mediastinal blood pool activity: SUV max 2.4 Liver activity: SUV max NA NECK: No areas of abnormal hypermetabolism. Incidental CT findings: No cervical adenopathy. Left carotid atherosclerosis. CHEST: No thoracic nodal hypermetabolism. The left lower lobe pulmonary nodule of interest measures 11 mm and a S.U.V. max of 9.7 on 30/7. Incidental CT findings: Deferred to recent  diagnostic CT. Cardiomegaly accentuated by a pectus excavatum deformity. Aortic atherosclerosis. ABDOMEN/PELVIS: No abdominopelvic nodal hypermetabolism. Hypermetabolism corresponding to moderate relatively long segment rectal wall thickening. Example at a S.U.V. max of 21.7 on 151/4. Incidental CT findings: Normal adrenal glands. Normal noncontrast appearance of the liver, gallbladder, pancreas, spleen, kidneys. Abdominal aortic atherosclerosis. Apparent bladder wall thickening in the setting of underdistention. SKELETON: No abnormal marrow activity. Incidental CT findings: none IMPRESSION: 1. Hypermetabolic left lower lobe pulmonary nodule with differential considerations of isolated pulmonary metastasis versus metachronous primary bronchogenic carcinoma. 2. Marked hypermetabolism corresponding to residual rectal wall thickening. The extent of rectal hypermetabolism favors residual disease over radiation change. 3. No other evidence of hypermetabolic metastasis. 4. Apparent bladder wall thickening could be due to underdistention, radiation induced cystitis, or infectious cystitis. Consider correlation with urinalysis. Electronically Signed   By: Abigail Miyamoto M.D.   On: 11/25/2021 08:24     Past medical hx Past Medical History:  Diagnosis Date   Bilateral wrist pain 10/25/2017   Hypertension    Neck mass 1998   Unknown biopsy results. Excised in [place].    Pre-diabetes    Rectal cancer ([place])    Trigger finger, acquired 02/08/2017     Social History   Tobacco Use   Smoking status: Never   Smokeless tobacco: Never  Vaping Use   Vaping Use: Never used  Substance Use Topics   Alcohol use: No   Drug use: No    Jillian Lutz reports that she has never smoked. She has never used smokeless tobacco. She reports that she does not drink alcohol and does not use drugs.  Tobacco Cessation: Never smoker   Past surgical hx, Family hx, Social hx all reviewed.  Current Outpatient Medications on File Prior to Visit  Medication Sig   amLODipine (NORVASC) 5 MG tablet Take 1 tablet (5 mg total) by mouth daily.   gabapentin (NEURONTIN) 100 MG capsule Take 1 capsule (100 mg total) by mouth 3 (three) times daily as needed. (Patient taking differently: Take 100 mg by mouth daily.)   lisinopril-hydrochlorothiazide (ZESTORETIC) 20-12.5 MG tablet Take 2 tablets by mouth daily.   Multiple Vitamins-Minerals (MULTIVITAMIN WITH MINERALS) tablet Take 1 tablet by mouth daily. Gummy 50 mg   Na Sulfate-K Sulfate-Mg Sulf (SUPREP BOWEL PREP KIT) 17.5-3.13-1.6 GM/177ML SOLN Take 1 kit by mouth as directed.   prochlorperazine (COMPAZINE) 10 MG tablet Take 1 tablet (10 mg total) by mouth every 6 (six) hours as needed.   capecitabine (XELODA) 500 MG tablet Take 3 tablets (1,500 mg total) by mouth 2 (two) times daily after a meal. Take for 14 days on, 7 days off. Repeat every 21 days. (Patient not taking: Reported on 12/23/2021)   No current facility-administered medications on file prior to visit.     No Known Allergies  Review Of Systems:  Constitutional:   No  weight loss, night sweats,  Fevers, chills, fatigue, or  lassitude.  HEENT:   No headaches,  Difficulty swallowing,  Tooth/dental problems, or  Sore throat,                No sneezing, itching, ear ache, nasal congestion, post nasal drip,   CV:  No chest pain,  Orthopnea, PND, swelling in lower extremities,  anasarca, dizziness, palpitations, syncope.   GI  No heartburn, indigestion, abdominal pain, nausea, vomiting, diarrhea, change in bowel habits, loss of appetite, bloody stools.   Resp: No shortness of breath with exertion or at rest.  No excess mucus, no  productive cough,  No non-productive cough,  No coughing up of blood.  No change in color of mucus.  No wheezing.  No chest wall deformity  Skin: no rash or lesions.  GU: no dysuria, change in color of urine, no urgency or frequency.  No flank pain, no hematuria   MS:  No joint pain or swelling.  No decreased range of motion.  No back pain.  Psych:  No change in mood or affect. No depression or anxiety.  No memory loss.   Vital Signs BP 118/66 (BP Location: Left Arm, Patient Position: Sitting, Cuff Size: Normal)   Pulse 61   Temp 98.2 F (36.8 C) (Oral)   Ht 5' (1.524 m)   Wt 112 lb 6.4 oz (51 kg)   SpO2 98%   BMI 21.95 kg/m    Physical Exam:  General- No distress,  A&Ox3, pleasant, quiet. Communication through an interpreter. ENT: No sinus tenderness, TM clear, pale nasal mucosa, no oral exudate,no post nasal drip, no LAN Cardiac: S1, S2, regular rate and rhythm, no murmur Chest: No wheeze/ rales/ dullness; no accessory muscle use, no nasal flaring, no sternal retractions, diminished per bases Abd.: Soft Non-tender, ND, BS +, Body mass index is 21.95 kg/m.  Ext: No clubbing cyanosis, edema Neuro:  normal strength, MAE x 4, A&O x 3 Skin: No rashes, No lesions , warm and dry Psych: normal mood and behavior   Assessment/Plan Lung Nodules suspicious for metastatic disease from known colon/ rectal  cancer  Biopsies negative for malignant cells >> Benign bronchial cells and pulmonary macrophages and  Benign bronchial mucosa and alveolated lung  Pt does not want repeat biopsies PET scan and rising CEA Plan It is good to see you today. We will refer you to radiation oncology to treat your suspected lung cancer  The  appointment with Dr. Lisbeth Renshaw needs to be co-ordinate with Archie Balboa (congregational nurse) can be reached by cell at [phone]. He will discuss the option of radiation therapy to the lung nodules . If you change your mind about repeating the biopsy, please let the office  know, and we will place an order.  Please Remember, delaying treatment is not in your best interest. Please contact office for sooner follow up if symptoms do not improve or worsen or seek emergency care     Chng ti s? gi?i thi?u b?n ??n khoa ung th? b?c x? Cu?c h?n v?i Ti?n s? [place] c?n ???c ph?i h?p v?i Archie Balboa (y t h?i ??ng) c th? lin h? qua ?i?n tho?i di ??ng t?i [phone]. Anh ?y s? th?o lu?n v? l?a ch?n x? tr? cho cc n?t ph?i. N?u b?n ??i  v? vi?c l?p l?i sinh thi?t, vui lng cho v?n phng bi?t v chng ti s? ??t hng. Hy nh? r?ng, tr hon ?i?u tr? khng ph?i l l?i ch t?t nh?t c?a b?n. Vui lng lin h? v?i v?n phng ?? ???c theo di s?m h?n n?u cc tri?u ch?ng khng c?i thi?n ho?c x?u ?i ho?c tm ki?m s? ch?m [place] kh?n c?p   I spent 45  minutes dedicated to the care of this patient on the date of this encounter to include pre-visit review of records, face-to-face time with the patient discussing conditions above, post visit ordering of testing, clinical documentation with the electronic health record, making appropriate referrals as documented, and communicating necessary information to the patient's healthcare team.    Magdalen Spatz, NP 12/23/2021  10:01 AM

## 2021-12-27 ENCOUNTER — Telehealth: Payer: Self-pay | Admitting: Radiation Oncology

## 2021-12-27 ENCOUNTER — Encounter: Payer: Self-pay | Admitting: Gastroenterology

## 2021-12-27 NOTE — Telephone Encounter (Signed)
Called patient w. Interpreter to schedule a consultation w. Bryson Ha. No answer, unable to LVM, VM is full.

## 2021-12-28 ENCOUNTER — Telehealth: Payer: Self-pay | Admitting: Radiation Oncology

## 2021-12-28 NOTE — Telephone Encounter (Signed)
Sent unable to contact letter 6/14.

## 2021-12-28 NOTE — Telephone Encounter (Signed)
Spoke with patient's interpreter "[name]" to schedule a consultation for the patient w. Dr. Lisbeth Renshaw. Interpreter stated she spoke with the patient, and she does not want to schedule a appointment at this time. Closing referral until further notice.

## 2021-12-28 NOTE — Telephone Encounter (Signed)
Called pt's interpreter Y'hin, he requested for me to call [name] to schedule a consultation w. Bryson Ha for patient. No answer, LVM for a return call.

## 2021-12-28 NOTE — Congregational Nurse Program (Signed)
Home visit with interpreter [name].  Informed patient of appointment for labwork and with Dr. Burr Medico on 06/19'2023.  Transportation arranged with an 11:15 pickup time.  Also told her that Dr. Ida Rogue office called to schedule appointment but she declines to do that at this time.  She stated she does not want a repeat lung biopsy but agreed to follow through with the colonoscopy scheduled on 01/31/2022.  States she is feeling much better and continues to have normal BM's with bleeding or pain.  Jake Michaelis RN, Congregational Nurse [phone]

## 2021-12-30 ENCOUNTER — Other Ambulatory Visit: Payer: Self-pay

## 2021-12-30 DIAGNOSIS — C20 Malignant neoplasm of rectum: Secondary | ICD-10-CM

## 2022-01-02 ENCOUNTER — Other Ambulatory Visit: Payer: Self-pay | Admitting: Lab

## 2022-01-02 ENCOUNTER — Inpatient Hospital Stay: Payer: Self-pay

## 2022-01-02 ENCOUNTER — Other Ambulatory Visit: Payer: Self-pay

## 2022-01-02 ENCOUNTER — Inpatient Hospital Stay (HOSPITAL_BASED_OUTPATIENT_CLINIC_OR_DEPARTMENT_OTHER): Payer: Self-pay | Admitting: Hematology

## 2022-01-02 ENCOUNTER — Encounter: Payer: Self-pay | Admitting: Hematology

## 2022-01-02 VITALS — BP 135/78 | HR 67 | Temp 98.4°F | Resp 18 | Ht 60.0 in | Wt 115.3 lb

## 2022-01-02 DIAGNOSIS — C20 Malignant neoplasm of rectum: Secondary | ICD-10-CM

## 2022-01-02 LAB — CMP (CANCER CENTER ONLY)
ALT: <5 U/L (ref 0–44)
AST: 15 U/L (ref 15–41)
Albumin: 3.7 g/dL (ref 3.5–5.0)
Alkaline Phosphatase: 52 U/L (ref 38–126)
Anion gap: 7 (ref 5–15)
BUN: 12 mg/dL (ref 8–23)
CO2: 27 mmol/L (ref 22–32)
Calcium: 9.5 mg/dL (ref 8.9–10.3)
Chloride: 106 mmol/L (ref 98–111)
Creatinine: 0.75 mg/dL (ref 0.44–1.00)
GFR, Estimated: >60 mL/min (ref >60)
Glucose, Bld: 108 mg/dL — ABNORMAL HIGH (ref 70–99)
Potassium: 4 mmol/L (ref 3.5–5.1)
Sodium: 140 mmol/L (ref 135–145)
Total Bilirubin: 0.3 mg/dL (ref 0.3–1.2)
Total Protein: 6.8 g/dL (ref 6.5–8.1)

## 2022-01-02 LAB — CBC WITH DIFFERENTIAL (CANCER CENTER ONLY)
Abs Immature Granulocytes: 0.02 10*3/uL (ref 0.00–0.07)
Basophils Absolute: 0.1 10*3/uL (ref 0.0–0.1)
Basophils Relative: 1 %
Eosinophils Absolute: 0.3 10*3/uL (ref 0.0–0.5)
Eosinophils Relative: 4 %
HCT: 34.1 % — ABNORMAL LOW (ref 36.0–46.0)
Hemoglobin: 11.2 g/dL — ABNORMAL LOW (ref 12.0–15.0)
Immature Granulocytes: 0 %
Lymphocytes Relative: 11 %
Lymphs Abs: 0.8 10*3/uL (ref 0.7–4.0)
MCH: 25.2 pg — ABNORMAL LOW (ref 26.0–34.0)
MCHC: 32.8 g/dL (ref 30.0–36.0)
MCV: 76.8 fL — ABNORMAL LOW (ref 80.0–100.0)
Monocytes Absolute: 0.5 10*3/uL (ref 0.1–1.0)
Monocytes Relative: 8 %
Neutro Abs: 5 10*3/uL (ref 1.7–7.7)
Neutrophils Relative %: 76 %
Platelet Count: 275 10*3/uL (ref 150–400)
RBC: 4.44 MIL/uL (ref 3.87–5.11)
RDW: 14.7 % (ref 11.5–15.5)
WBC Count: 6.7 10*3/uL (ref 4.0–10.5)
nRBC: 0 % (ref 0.0–0.2)

## 2022-01-02 LAB — IRON AND IRON BINDING CAPACITY (CC-WL,HP ONLY)
Iron: 22 ug/dL — ABNORMAL LOW (ref 28–170)
Saturation Ratios: 8 % — ABNORMAL LOW (ref 10.4–31.8)
TIBC: 273 ug/dL (ref 250–450)
UIBC: 251 ug/dL (ref 148–442)

## 2022-01-02 LAB — FERRITIN: Ferritin: 71 ng/mL (ref 11–307)

## 2022-01-02 NOTE — Progress Notes (Signed)
Jillian Lutz   [phone] [phone]   Clinic Follow up Note   Patient Care Team: Jillian Gerold, DO as PCP - General (Internal Medicine) Jillian Finner, RN (Inactive) as Oncology Nurse Navigator Jillian Feeling, NP as Nurse Practitioner (Nurse Practitioner) Jillian Merle, MD as Consulting Physician (Hematology and Oncology) Jillian Rudd, MD as Consulting Physician (Radiation Oncology) Jillian Lutz, Jillian Nab., MD as Consulting Physician (Gastroenterology) Jillian Nash, DO as Consulting Physician (Pulmonary Disease) Jillian Boston, MD as Consulting Physician (General Surgery)  Date of Service:  01/02/2022  CHIEF COMPLAINT: f/u of rectal cancer  CURRENT THERAPY:  Pending work up  ASSESSMENT & PLAN:  Jillian Lutz is a 71 y.o. female with   1.  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  -Staging CT CAP on 12/06/20 showed: perirectal lymph node involvement; single nonspecific left pulmonary nodule.  -Despite strong recommendation, patient firmly and repeatedly declined IV chemo and surgery, due to the fear of being sick and not able to take time off from work  -she agreed to chemoRT with Xeloda, 01/04/21 - 02/14/21. She tolerated well. -she previously declined surveillance measures, however, she developed pencil-thin stool and decreased caliber of stool. Her CEA normalized after chemoRT but increased to 18.49 on 09/28/21. She agreed to work up, and CT AP on 10/07/21 showed enlargement of LLL nodule.  -Further work up with PET  11/24/21, showed: hypermetabolic LLL nodule; marked hypermetabolism corresponding to residual rectal wall thickening; no  other evidence of hypermetabolic metastasis. -bronchoscopy 12/13/21 by Dr. Valeta Harms, cytology negative for malignancy. -we referred her back to GI for consideration of repeat colonoscopy. They were willing to fit her in the following week, but she declined and opted to postpone this by a month. She is currently scheduled for surveillance colonoscopy 01/31/22 with Jillian Lutz. -I expressed concern with her delaying these procedures and treatment. I explained to her that her cancer can grow in the time she is delaying these procedures and thus treatment. I discussed obtaining a partial colonoscopy sooner or obtaining a CT-guided lung biopsy instead, just to speed up confirming if she has cancer. She is very firm that she wants to keep her current plan to f/u with GI on 7/18 and does not want to change it due to social issues. She otherwise voiced good understanding the potential consequence of delaying treatment    2. Symptom Management: constipation low appetite, insomnia  -she was prescribed mirtazapine on 10/11/21 for appetite and sleep. -labs from 09/28/21 showed hgb 11, iron panel and ferritin WNL.  Anemia is likely related to her underlying malignancy. Today's results are pending. -she reports recurrent constipation. I again recommend milk of magnesium.   3. Social support -She and her husband moved from [place] in 2014. Her children remain in [place], spanning ages 58-50 -Jillian Lutz is independent with ADLs but does not have a driver's license. She works in a [employer] and is the single financial contributor in her family -She has orange card. She has been approved for free drug replacement through [facility], documents in [place]. -her husband has Alzheimer's, and she is his caregiver.  -She has Jillian Lutz, trade union and Jillian Lutz.    4.  HTN, DM, pre-existing neuropathy -she is  taking lisinopril, gabapentin, amlodipine, and metformin.     PLAN -she again declined CT-guided lung biopsy or  partial colonoscopy, she will f/u with GI 01/31/22 -continue holding Xeloda or any cancer treatment  -we discussed increasing Miralax and use Magnesium citrate for her constipation  -lab and f/u in 6 weeks   No problem-specific Assessment & Plan notes found for this encounter.   SUMMARY OF ONCOLOGIC HISTORY: Oncology History Overview Note  Cancer Staging Rectal adenocarcinoma [facility]) Staging form: Colon and Rectum, AJCC 8th Edition - Clinical stage from 12/21/2020: Stage IIIB (cT3, cN1, cM0) - Unsigned    Rectal adenocarcinoma ([place])  08/26/2020 Miscellaneous   Initial presentation to PCP, reporting intermittent BRBPR since 02/2020    12/01/2020 Procedure   Colonoscopy by Jillian Lutz findings - The perianal and digital rectal examinations were normal. - A 15 mm polyp was found in the ascending colon. The polyp was semi-pedunculated. Resection and retrieval were complete. - A 25 mm polyp was found in the sigmoid colon. The polyp was pedunculated. Resection and retrieval were complete. - An infiltrative partially obstructing large mass was found in the proximal rectum. The mass was partially circumferential (involving one-half of the lumen circumference). The mass measured eight cm in length extending from 10 to 18cm from anal verge. This was biopsied with a cold forceps for histology. Proximal and distal opposite fold area of the mass lesion was tattooed with an injection of total 3 mL of Spot (carbon black).   12/01/2020 Initial Biopsy   Diagnosis 1. Colon, polyp(s), ascending x 1 - ADENOCARCINOMA ARISING IN A TUBULAR ADENOMA WITH HIGH-GRADE DYSPLASIA. SEE NOTE 2. Colon, sigmoid polyp, x1 - TUBULOVILLOUS ADENOMA(S) - NEGATIVE FOR HIGH-GRADE DYSPLASIA OR MALIGNANCY 3. Rectum, biopsy - ADENOCARCINOMA. SEE NOTE   12/01/2020 Cancer Staging   Cancer Staging Rectal adenocarcinoma [facility]) Staging form: Colon and Rectum, AJCC 8th Edition - Clinical stage from 12/21/2020: Stage IIIB (cT3, cN1,  cM0) - Unsigned    12/06/2020 Imaging   CT CAP with contrast IMPRESSION: 1. There is partially circumferential soft tissue thickening of the mid to superior rectum, approximately 5 cm in length and the inferior extent approximately 6 cm above the anal verge, consistent with primary rectal malignancy identified by colonoscopy. 2. There appear to be abnormally enlarged perirectal lymph nodes posteriorly about the superior rectum measuring up to 1.0 x 0.8 cm. Findings are suspicious for perirectal nodal metastatic disease, however rectal MRI is the test of choice for initial local staging of rectal cancer. 3. There is a 4 mm nonspecific pulmonary nodule of the superior segment left lower lobe, statistically most likely incidental, infectious or inflammatory, although nonspecific and isolated metastatic disease is not strictly excluded. Attention on follow-up. 4. No other evidence of metastatic disease in the chest, abdomen, or pelvis. Aortic Atherosclerosis (ICD10-I70.0).   12/09/2020 Imaging   Local staging MRI pelvis without contrast IMPRESSION: 4.9 cm circumferential mid/lower rectal mass, corresponding to the patient's newly diagnosed rectal cancer. Rectal adenocarcinoma T stage: T3c Rectal adenocarcinoma N stage:  N1 Distance from tumor to the internal anal sphincter is 3.7 cm.   12/21/2020 Initial Diagnosis   Rectal adenocarcinoma ([place])   01/04/2021 -  Chemotherapy   Concurrent chemoradiation with Xeloda 1046m in the AM and 15063min the PM on days of Radiation starting 01/04/21.       01/04/2021 - 02/11/2021 Radiation Therapy   Concurrent chemoradiation with Dr MoLisbeth Renshawnd Xeloda starting 01/04/21.       INTERVAL HISTORY:  HdJoylene Lutz  is here for a follow up of rectal cancer. She was last seen by PA Cassie on 12/16/21. She presents to the clinic accompanied by congregational nurse [name] and interpreter. She reports she is having recurrent constipation.   All other systems  were reviewed with the patient and are negative.  MEDICAL HISTORY:  Past Medical History:  Diagnosis Date   Bilateral wrist pain 10/25/2017   Hypertension    Neck mass 1998   Unknown biopsy results. Excised in [place].   Pre-diabetes    Rectal cancer ([place])    Trigger finger, acquired 02/08/2017    SURGICAL HISTORY: Past Surgical History:  Procedure Laterality Date   BRONCHIAL BIOPSY  12/13/2021   Procedure: BRONCHIAL BIOPSIES;  Surgeon: Jillian Nash, DO;  Location: [facility];  Service: Pulmonary;;   BRONCHIAL NEEDLE ASPIRATION BIOPSY  12/13/2021   Procedure: BRONCHIAL NEEDLE ASPIRATION BIOPSIES;  Surgeon: Jillian Nash, DO;  Location: [facility];  Service: Pulmonary;;   NECK SURGERY Left 1998   Excision of mass in [place]   VIDEO BRONCHOSCOPY WITH RADIAL ENDOBRONCHIAL ULTRASOUND  12/13/2021   Procedure: RADIAL ENDOBRONCHIAL ULTRASOUND;  Surgeon: Jillian Nash, DO;  Location: [facility];  Service: Pulmonary;;    I have reviewed the social history and family history with the patient and they are unchanged from previous note.  ALLERGIES:  has No Known Allergies.  MEDICATIONS:  Current Outpatient Medications  Medication Sig Dispense Refill   amLODipine (NORVASC) 5 MG tablet Take 1 tablet (5 mg total) by mouth daily. 90 tablet 3   capecitabine (XELODA) 500 MG tablet Take 3 tablets (1,500 mg total) by mouth 2 (two) times daily after a meal. Take for 14 days on, 7 days off. Repeat every 21 days. (Patient not taking: Reported on 12/23/2021) 84 tablet 1   gabapentin (NEURONTIN) 100 MG capsule Take 1 capsule (100 mg total) by mouth 3 (three) times daily as needed. (Patient taking differently: Take 100 mg by mouth daily.) 90 capsule 2   lisinopril-hydrochlorothiazide (ZESTORETIC) 20-12.5 MG tablet Take 2 tablets by mouth daily. 60 tablet 3   Multiple Vitamins-Minerals (MULTIVITAMIN WITH MINERALS) tablet Take 1 tablet by mouth daily. Gummy 50 mg     Na Sulfate-K Sulfate-Mg Sulf  (SUPREP BOWEL PREP KIT) 17.5-3.13-1.6 GM/177ML SOLN Take 1 kit by mouth as directed. 354 mL 0   prochlorperazine (COMPAZINE) 10 MG tablet Take 1 tablet (10 mg total) by mouth every 6 (six) hours as needed. 30 tablet 2   No current facility-administered medications for this visit.    PHYSICAL EXAMINATION: ECOG PERFORMANCE STATUS: 1 - Symptomatic but completely ambulatory  Vitals:   01/02/22 1225  BP: 135/78  Pulse: 67  Resp: 18  Temp: 98.4 F (36.9 C)  SpO2: 100%   Wt Readings from Last 3 Encounters:  01/02/22 115 lb 4.8 oz (52.3 kg)  12/23/21 112 lb 6.4 oz (51 kg)  12/22/21 114 lb 6.4 oz (51.9 kg)     GENERAL:alert, no distress and comfortable SKIN: skin color normal, no rashes or significant lesions EYES: normal, Conjunctiva are pink and non-injected, sclera clear  NEURO: alert & oriented x 3 with fluent speech  LABORATORY DATA:  I have reviewed the data as listed    Latest Ref Rng & Units 01/02/2022   11:52 AM 12/16/2021    9:33 AM 12/13/2021   11:15 AM  CBC  WBC 4.0 - 10.5 K/uL 6.7  7.0  6.2   Hemoglobin 12.0 - 15.0 g/dL 11.2  11.0  12.2  Hematocrit 36.0 - 46.0 % 34.1  33.6  37.7   Platelets 150 - 400 K/uL 275  240  250         Latest Ref Rng & Units 01/02/2022   11:52 AM 12/16/2021    9:33 AM 12/13/2021   11:15 AM  CMP  Glucose 70 - 99 mg/dL 108  124  107   BUN 8 - 23 mg/dL _0 Creatinine 0.44 - 1.00 mg/dL 0.75  0.71  0.72   Sodium 135 - 145 mmol/L 140  138  140   Potassium 3.5 - 5.1 mmol/L 4.0  3.8  3.6   Chloride 98 - 111 mmol/L 106  103  106   CO2 22 - 32 mmol/L _1 Calcium 8.9 - 10.3 mg/dL 9.5  9.2  8.9   Total Protein 6.5 - 8.1 g/dL 6.8  6.5    Total Bilirubin 0.3 - 1.2 mg/dL 0.3  0.4    Alkaline Phos 38 - 126 U/L 52  52    AST 15 - 41 U/L 15  16    ALT 0 - 44 U/L <5  6        RADIOGRAPHIC STUDIES: I have personally reviewed the radiological images as listed and agreed with the findings in the report. No results found.    No  orders of the defined types were placed in this encounter.  All questions were answered. The patient knows to call the clinic with any problems, questions or concerns. No barriers to learning was detected. The total time spent in the appointment was 45 minutes.     Jillian Merle, MD 01/02/2022   I, Wilburn Mylar, am acting as scribe for Jillian Merle, MD.   I have reviewed the above documentation for accuracy and completeness, and I agree with the above.

## 2022-01-11 ENCOUNTER — Telehealth: Payer: Self-pay

## 2022-01-11 NOTE — Telephone Encounter (Signed)
Phone call to patient with assistance from [facility] interpreter.  Patient sates she is now having normal bowel movements without the aid of medication.  States that the magnesium citrate she purchased worked well.  No complaints at present.  Patient will call with any concerns.  Jake Michaelis RN, Congregational Nurse [phone]

## 2022-01-13 ENCOUNTER — Ambulatory Visit: Payer: Self-pay | Admitting: Hematology

## 2022-01-13 ENCOUNTER — Other Ambulatory Visit: Payer: Self-pay

## 2022-01-18 ENCOUNTER — Other Ambulatory Visit: Payer: Self-pay | Admitting: Student

## 2022-01-18 ENCOUNTER — Other Ambulatory Visit (HOSPITAL_COMMUNITY): Payer: Self-pay

## 2022-01-18 DIAGNOSIS — R202 Paresthesia of skin: Secondary | ICD-10-CM

## 2022-01-18 MED ORDER — GABAPENTIN 100 MG PO CAPS
100.0000 mg | ORAL_CAPSULE | Freq: Three times a day (TID) | ORAL | 2 refills | Status: DC | PRN
  Filled 2022-01-18: qty 90, 30d supply, fill #0
  Filled 2022-03-01: qty 90, 30d supply, fill #1
  Filled 2022-05-08: qty 90, 30d supply, fill #2

## 2022-01-18 NOTE — Congregational Nurse Program (Signed)
Home visit to deliver patient's prescriptions which were picked up from [place]. Informed her that she has a follow-up appointment on 01/25/2022 with PCP at [facility] clinic.  CN will request transportation for appointment thru [facility]. Stated she is again having some trouble with constipation. Recommended she take Miralax.  Jake Michaelis RN, Congregational Nurse [phone]

## 2022-01-18 NOTE — Telephone Encounter (Signed)
Next appt scheduled 7/12 with PCP.

## 2022-01-25 ENCOUNTER — Ambulatory Visit (INDEPENDENT_AMBULATORY_CARE_PROVIDER_SITE_OTHER): Payer: Self-pay | Admitting: Student

## 2022-01-25 ENCOUNTER — Encounter: Payer: Self-pay | Admitting: Student

## 2022-01-25 DIAGNOSIS — Z658 Other specified problems related to psychosocial circumstances: Secondary | ICD-10-CM

## 2022-01-25 DIAGNOSIS — I10 Essential (primary) hypertension: Secondary | ICD-10-CM

## 2022-01-25 DIAGNOSIS — C20 Malignant neoplasm of rectum: Secondary | ICD-10-CM

## 2022-01-25 NOTE — Assessment & Plan Note (Signed)
Her current job consist of heavy work which she cannot handle due to current physical condition and medical conditions.  She is the only source of income the family.  She is also taking care of her disabled husband.  Unfortunately she does not have legal paperwork to apply for Medicare disability.  Work note given today for light duty.

## 2022-01-25 NOTE — Patient Instructions (Signed)
Ms. Kehm,  V? c?n ?au b?ng c?a b?n, ?y c th? l do m?t kh?i ? tr?c trng. Ti yn tm r?ng b?n khng c v?t c?n ?ng k? ho?c ?au ??n khi ki?m tra tr?c trng. Vui lng ti khm v?i bc s? chuyn khoa tiu ha vo ngy 18 thng 7 v bc s? [employer] th? vo ngy 31 thng 7.  Ti ? vi?t m?t ghi ch cng vi?c v?i nhi?m v? nh? cho b?n.  Vui lng quay l?i sau 3 thng   It was nice seeing you in the clinic today.  Regarding your abdominal pain, this could be from a rectal mass.  I am reassured that you did not have significant obstructions or pain with rectal exam.  Please follow-up with the gastroenterologist on 18 July and oncologist on 31 July.  I wrote a work note with light duty for you.  Please return in 3 months  Take care,  Dr. Alfonse Spruce

## 2022-01-25 NOTE — Progress Notes (Signed)
CC: Rectal pain  HPI:  [name] is a 71 y.o. with past medical history of hypertension, rectal adenocarcinoma who presents to the clinic today for worsening rectal pain.  Please see problem based charting for details.  Past Medical History:  Diagnosis Date   Bilateral wrist pain 10/25/2017   Hypertension    Neck mass 1998   Unknown biopsy results. Excised in [place].   Pre-diabetes    Rectal cancer ([place])    Trigger finger, acquired 02/08/2017   Review of Systems:  per HPI  Physical Exam:  Vitals:   01/25/22 0912  BP: 122/69  Pulse: 78  Temp: 98.1 F (36.7 C)  TempSrc: Oral  SpO2: 100%  Weight: 114 lb 9.6 oz (52 kg)   Physical Exam Constitutional:      General: She is not in acute distress.    Appearance: She is not ill-appearing.  HENT:     Head: Normocephalic.  Eyes:     General:        Right eye: No discharge.        Left eye: No discharge.     Conjunctiva/sclera: Conjunctivae normal.  Cardiovascular:     Rate and Rhythm: Normal rate and regular rhythm.  Pulmonary:     Effort: Pulmonary effort is normal. No respiratory distress.     Breath sounds: Normal breath sounds. No wheezing.  Abdominal:     General: Bowel sounds are normal. There is no distension.     Palpations: Abdomen is soft.     Tenderness: There is no abdominal tenderness.  Genitourinary:    Comments: She has a skin tag inferiorly to the anus but no obvious external hemorrhoids.  Digital exam did not appreciate any stool burden.  There was possibly a mass inferiorly.  Patient did not report pain with the exam.  No blood observed. Musculoskeletal:     Cervical back: Normal range of motion.  Skin:    General: Skin is warm.  Neurological:     Mental Status: She is alert and oriented to person, place, and time.  Psychiatric:        Mood and Affect: Mood normal.        Behavior: Behavior normal.      Assessment & Plan:   See Encounters Tab for problem based charting.  Essential  hypertension Blood pressure well controlled 122/69. Last CMP 6/19 showed normal kidney function.  -Continue amlodipine 5 mg -Continue lisinopril-HCTZ 20-12.5 mg  Rectal adenocarcinoma ([place]) Rectal adenocarcinoma diagnosed in 12/2020.  Patient underwent chemoradiation therapy.  Unfortunately her CEA has been trending upward recently.  Her oncologist Dr. Burr Medico recommended surveilling colonoscopy last month patient deferred it to visit with GI on 01/31/2022.  PET scan showed a hypermetabolic nodule in the left lower lobe.  She underwent bronchoscopy with biopsy which negative for malignancy.  She declined a CT-guided biopsy.  Patient endorses worsening rectal pain in the last 2 to 3 months.  Pain is with bowel movement due to straining.  Reports small stool caliber.  She has been taking MiraLAX with milk of magnesium that resulted in multiple bowel movements a day but a small amount each time.  She denies sensation of incomplete emptying.  Jillian Lutz that she usually have a bowel movement about every hour or more.  She denies nausea, vomiting, or hematochezia.  She reports poor appetite.  No obvious abnormality seen in the external anus.  She has a skin tag inferiorly to the anus but no obvious external hemorrhoids.  Digital exam did not appreciate any stool burden.  There was possibly a mass inferiorly but unclear.  She did not have pain with the exam.  No obstruction with digital exam or no hematochezia observed.  Suspecting her rectal pain is secondary to enlarging mass that worsens her constipation.  Radiation proctitis is on the differential but low suspicion at this time.  It is reassuring that she did not have significant obstruction or tenderness with physical exam.  She will have a follow-up appointment with GI on 7/18 for possible colonoscopy.  She at lower risk for significant obstruction in the next few days.  Encouraged patient to keep that appointment.  -Follow-up with oncology on  02/14/2019  Psychosocial stressors Her current job consist of heavy work which she cannot handle due to current physical condition and medical conditions.  She is the only source of income the family.  She is also taking care of her disabled husband.  Unfortunately she does not have legal paperwork to apply for Medicare disability.  Work note given today for light duty.    Patient discussed with Dr. Evette Doffing

## 2022-01-25 NOTE — Assessment & Plan Note (Signed)
Blood pressure well controlled 122/69. Last CMP 6/19 showed normal kidney function.  -Continue amlodipine 5 mg -Continue lisinopril-HCTZ 20-12.5 mg

## 2022-01-25 NOTE — Assessment & Plan Note (Signed)
Rectal adenocarcinoma diagnosed in 12/2020.  Patient underwent chemoradiation therapy.  Unfortunately her CEA has been trending upward recently.  Her oncologist Dr. Burr Medico recommended surveilling colonoscopy last month patient deferred it to visit with GI on 01/31/2022.  PET scan showed a hypermetabolic nodule in the left lower lobe.  She underwent bronchoscopy with biopsy which negative for malignancy.  She declined a CT-guided biopsy.  Patient endorses worsening rectal pain in the last 2 to 3 months.  Pain is with bowel movement due to straining.  Reports small stool caliber.  She has been taking MiraLAX with milk of magnesium that resulted in multiple bowel movements a day but a small amount each time.  She denies sensation of incomplete emptying.  Michela Pitcher that she usually have a bowel movement about every hour or more.  She denies nausea, vomiting, or hematochezia.  She reports poor appetite.  No obvious abnormality seen in the external anus.  She has a skin tag inferiorly to the anus but no obvious external hemorrhoids.  Digital exam did not appreciate any stool burden.  There was possibly a mass inferiorly but unclear.  She did not have pain with the exam.  No obstruction with digital exam or no hematochezia observed.  Suspecting her rectal pain is secondary to enlarging mass that worsens her constipation.  Radiation proctitis is on the differential but low suspicion at this time.  It is reassuring that she did not have significant obstruction or tenderness with physical exam.  She will have a follow-up appointment with GI on 7/18 for possible colonoscopy.  She at lower risk for significant obstruction in the next few days.  Encouraged patient to keep that appointment.  -Follow-up with oncology on 02/14/2019

## 2022-01-25 NOTE — Congregational Nurse Program (Signed)
Home visit with interpreter [name] to deliver Su-Prep for colonoscopy on 01/31/2022.  Reviewed instructions with  patient and she voiced understanding.  Transportation requested through [facility].  CN with be care partner for procedure.  Jake Michaelis RN, Congregational Nurse [phone]

## 2022-01-26 NOTE — Progress Notes (Signed)
Internal Medicine Clinic Attending  Case discussed with Dr. Nguyen  At the time of the visit.  We reviewed the resident's history and exam and pertinent patient test results.  I agree with the assessment, diagnosis, and plan of care documented in the resident's note. 

## 2022-01-31 ENCOUNTER — Ambulatory Visit (AMBULATORY_SURGERY_CENTER): Payer: Self-pay | Admitting: Gastroenterology

## 2022-01-31 ENCOUNTER — Encounter: Payer: Self-pay | Admitting: Gastroenterology

## 2022-01-31 VITALS — BP 120/70 | HR 72 | Temp 98.2°F | Resp 20 | Ht 59.0 in | Wt 112.0 lb

## 2022-01-31 DIAGNOSIS — R194 Change in bowel habit: Secondary | ICD-10-CM

## 2022-01-31 DIAGNOSIS — R933 Abnormal findings on diagnostic imaging of other parts of digestive tract: Secondary | ICD-10-CM

## 2022-01-31 DIAGNOSIS — Z85048 Personal history of other malignant neoplasm of rectum, rectosigmoid junction, and anus: Secondary | ICD-10-CM

## 2022-01-31 DIAGNOSIS — K648 Other hemorrhoids: Secondary | ICD-10-CM

## 2022-01-31 DIAGNOSIS — C20 Malignant neoplasm of rectum: Secondary | ICD-10-CM

## 2022-01-31 MED ORDER — SODIUM CHLORIDE 0.9 % IV SOLN: 500.0000 mL | Freq: Once | INTRAVENOUS | Status: DC

## 2022-01-31 NOTE — Progress Notes (Signed)
To pacu, VSS. Report to Rn.tb 

## 2022-01-31 NOTE — Progress Notes (Signed)
Called to room to assist during endoscopic procedure.  Patient ID and intended procedure confirmed with present staff. Received instructions for my participation in the procedure from the performing physician.  

## 2022-01-31 NOTE — Patient Instructions (Addendum)
Soft diet indefinitely  Follow up with Oncology   Continue present medications   Await biopsy results  Miralax 1 capful daily as instructed ( purchase over the counter )- this will keep BMs soft and moving     YOU HAD AN ENDOSCOPIC PROCEDURE TODAY AT [facility]:   Refer to the procedure report that was given to you for any specific questions about what was found during the examination.  If the procedure report does not answer your questions, please call your gastroenterologist to clarify.  If you requested that your care partner not be given the details of your procedure findings, then the procedure report has been included in a sealed envelope for you to review at your convenience later.  YOU SHOULD EXPECT: Some feelings of bloating in the abdomen. Passage of more gas than usual.  Walking can help get rid of the air that was put into your GI tract during the procedure and reduce the bloating. If you had a lower endoscopy (such as a colonoscopy or flexible sigmoidoscopy) you may notice spotting of blood in your stool or on the toilet paper. If you underwent a bowel prep for your procedure, you may not have a normal bowel movement for a few days.  Please Note:  You might notice some irritation and congestion in your nose or some drainage.  This is from the oxygen used during your procedure.  There is no need for concern and it should clear up in a day or so.  SYMPTOMS TO REPORT IMMEDIATELY:  Following lower endoscopy (colonoscopy or flexible sigmoidoscopy):  Excessive amounts of blood in the stool  Significant tenderness or worsening of abdominal pains  Swelling of the abdomen that is new, acute  Fever of 100F or higher   For urgent or emergent issues, a gastroenterologist can be reached at any hour by calling [phone]. Do not use MyChart messaging for urgent concerns.    DIET:  We do recommend a small meal at first, but then you may proceed to your  regular diet.  Drink plenty of fluids but you should avoid alcoholic beverages for 24 hours.  ACTIVITY:  You should plan to take it easy for the rest of today and you should NOT DRIVE or use heavy machinery until tomorrow (because of the sedation medicines used during the test).    FOLLOW UP: Our staff will call the number listed on your records the next business day following your procedure.  We will call around 7:15- 8:00 am to check on you and address any questions or concerns that you may have regarding the information given to you following your procedure. If we do not reach you, we will leave a message.  If you develop any symptoms (ie: fever, flu-like symptoms, shortness of breath, cough etc.) before then, please call [phone].  If you test positive for Covid 19 in the 2 weeks post procedure, please call and report this information to [place].    If any biopsies were taken you will be contacted by phone or by letter within the next 1-3 weeks.  Please call us at [phone] if you have not heard about the biopsies in 3 weeks.    SIGNATURES/CONFIDENTIALITY: You and/or your care partner have signed paperwork which will be entered into your electronic medical record.  These signatures attest to the fact that that the information above on your After Visit Summary has been reviewed and is understood.  Full responsibility of the  confidentiality of this discharge information lies with you and/or your care-partner    . HM NAY QU V? ? TH?C HI?N TH? THU?T N?I SOI T?I TRUNG TM N?I SOI [place]: Vui lng xem bo co th? thu?t ? ???c g?i cho qu v?, n?u qu v? c b?t k? th?c m?c g trong su?t qu trnh th?m khm. N?u bo co th? thu?t khng th? gi?i ?p th?c m?c c?a qu v?, vui lng g?i cho bc s? chuyn khoa tiu ha c?a qu v? ?? ???c gi?i ?p. N?u qu v? ? yu c?u khng cung c?p thng tin chi ti?t v? k?t qu? th? thu?t cho ??i tc ch?m [place] c?a qu v?, th bo co th? thu?t s? ???c g?i trong m?t  phong b ???c dn kn ?? qu v? xem khi thu?n ti?n.   QU V? C TH?: C?m gic ch??ng b?ng. Trung ti?n nhi?u h?n bnh th??ng. ?i b? c th? gip ??y ra ngoi khng kh ?i vo ???ng tiu ha trong khi th?c hi?n th? thu?t v gi?m ch??ng b?ng. N?u qu v? ti?n hnh n?i soi d??i (nh? n?i soi ??i trng ho?c soi k?t trng xch-ma b?ng ?ng m?m), qu v? c th? th?y cc ch?m mu ? phn ho?c trn gi?y v? sinh. N?u qu v? ? lm s?ch ??i trng ?? th?c hi?n th? thu?t, qu v? c th? khng ?i ??i ti?n nh? bnh th??ng trong vi ngy.  Vui Lng L?u : Qu v? c th? b? kch ?ng v ngh?t m?i ho?c ch?y n??c m?i. Tnh tr?ng ny l do ?nh h??ng c?a vi?c th? bnh oxy trong qu trnh th?c hi?n th? thu?t. Qu v? khng c?n lo l?ng, tnh tr?ng ny s? bi?n m?t sau m?t ho?c vi ngy.   CC TRI?U CH?NG C?N BO CO NGAY  Sau khi th?c hi?n n?i soi d??i (n?i soi ??i trng ho?c soi k?t trng xch-ma b?ng ?ng m?m): Phn c nhi?u mu ?au b?ng d? d?i ho?c ngy cng t?ng Xu?t hi?n v?t s?ng b?ng m?i, c?p tnh S?t t? 100F tr? ln   Sau khi th?c hi?n n?i soi trn (EGD)  Nn ra mu ho?c ch?t mu c ph s?m Xu?t hi?n c?n ?au ng?c ho?c ?au d??i x??ng b? vai m?i Nu?t ?au ho?c kh nu?t M?i b? kh th? S?t t? 100F tr? ln Phn ?en nh? m?c  ??i v?i cc v?n ?? kh?n c?p ho?c c?p c?u, qu v? c th? lin h? bc s? chuyn khoa tiu ha b?t k? lc no b?ng cch g?i ??n s? [phone].   CH? ?? ?N U?NG: Chng ti [employer] v? tr??c tin nn ?n nh?, nh?ng sau ? qu v? c th? ?n theo ch? ?? bnh th??ng. U?ng nhi?u n??c nh?ng ph?i trnh ?? u?ng c c?n trong 24 gi?.  HO?T ??NG: Qu v? c?n ln k? ho?ch ?? ngh? ng?i trong ngy hm nay v K[facility] NN LI XE ho?c s? d?ng my mc n?ng cho ??n ngy mai (do tc d?ng c?a thu?c an th?n s? d?ng trong th? thu?t).   THEO DI: Nhn vin c?a chng ti s? g?i cho qu v? theo s? ?i?n tho?i trong b?nh n vo ngy lm vi?c ti?p theo sau ngy th?c hi?n th? thu?t ?? ki?m tra tnh tr?ng c?a qu v? v gi?i ?p cc cu h?i ho?c  th?c m?c c?a qu v? v? thng tin m qu v? ???c cung c?p sau khi th?c hi?n th? thu?t. N?u chng ti khng lin l?c ???c v?i qu  v?, chng ti s? ?? l?i tin nh?n. Tuy nhin, n?u qu v? c?m th?y kh?e v khng g?p b?t k? s? c? no, qu v? khng c?n g?i l?i cho chng ti. Chng ti s? gi? ??nh r?ng qu v? ? tr? l?i sinh ho?t bnh th??ng v khng g?p b?t k? s? c? no.  N?u qu v? ???c l?y sinh thi?t, chng ti s? lin l?c v?i qu v? qua ?i?n tho?i ho?c th? trong 1-3 tu?n ti?p theo. Vui lng g?i cho chng ti theo s? [phone] n?u qu v? khng nh?n ???c thng tin v? k?t qu? sinh thi?t trong 3 tu?n.  CH? K/B?O M?TSander Nephew v? v/ho?c ??i tc ch?m [place] c?a qu v? ? k vo cc ti li?u s? ???c nh?p vo h? s? y t? ?i?n t? c?a qu v?. Ch? k ny xc nh?n r?ng cc thng tin trn ?y trong B?n Tm T?t Sau Khi Th?m Khm c?a qu v? ? ???c xem xt v hi?u r. Qu v? v/ho?c

## 2022-01-31 NOTE — Progress Notes (Signed)
Vitals-DT  History reviewed.  Apparently a nurse is with her, but we are unable to locate her. [place].  Patient is answering her medication questions through the interpreter.  Patient is quite slow responding.  Care-partner was located.  She had left the premises

## 2022-01-31 NOTE — Progress Notes (Signed)
[facility] History and Physical   Primary Care Physician:  Gaylan Gerold, DO   Reason for Procedure:  History of rectal cancer, change in bowel habits  Plan:    Surveillance colonoscopy with possible interventions as needed     HPI: Jillian Lutz is a very pleasant 71 y.o. female here for colonoscopy for evaluation of change in bowel habits, worsening constipation, rectal thickening with increased uptake on PET CT concerning for recurrent rectal CA. She has pulmonary mets   The risks and benefits as well as alternatives of endoscopic procedure(s) have been discussed and reviewed. All questions answered. The patient agrees to proceed.    Past Medical History:  Diagnosis Date   Bilateral wrist pain 10/25/2017   Hypertension    Neck mass 1998   Unknown biopsy results. Excised in [place].   Pre-diabetes    Rectal cancer ([place])    Trigger finger, acquired 02/08/2017    Past Surgical History:  Procedure Laterality Date   BRONCHIAL BIOPSY  12/13/2021   Procedure: BRONCHIAL BIOPSIES;  Surgeon: Garner Nash, DO;  Location: [facility];  Service: Pulmonary;;   BRONCHIAL NEEDLE ASPIRATION BIOPSY  12/13/2021   Procedure: BRONCHIAL NEEDLE ASPIRATION BIOPSIES;  Surgeon: Garner Nash, DO;  Location: [facility];  Service: Pulmonary;;   NECK SURGERY Left 1998   Excision of mass in [place]   VIDEO BRONCHOSCOPY WITH RADIAL ENDOBRONCHIAL ULTRASOUND  12/13/2021   Procedure: RADIAL ENDOBRONCHIAL ULTRASOUND;  Surgeon: Garner Nash, DO;  Location: [facility];  Service: Pulmonary;;    Prior to Admission medications   Medication Sig Start Date End Date Taking? Authorizing Provider  amLODipine (NORVASC) 5 MG tablet Take 1 tablet (5 mg total) by mouth daily. 10/26/21   Gaylan Gerold, DO  capecitabine (XELODA) 500 MG tablet Take 3 tablets (1,500 mg total) by mouth 2 (two) times daily after a meal. Take for 14 days on, 7 days off. Repeat every 21 days. Patient not taking:  Reported on 12/23/2021 11/25/21   [name], MD  gabapentin (NEURONTIN) 100 MG capsule Take 1 capsule (100 mg total) by mouth 3 (three) times daily as needed. 01/18/22   Gaylan Gerold, DO  lisinopril-hydrochlorothiazide (ZESTORETIC) 20-12.5 MG tablet Take 2 tablets by mouth daily. 10/26/21   Gaylan Gerold, DO  Multiple Vitamins-Minerals (MULTIVITAMIN WITH MINERALS) tablet Take 1 tablet by mouth daily. Gummy 50 mg    [provider]  Na Sulfate-K Sulfate-Mg Sulf (SUPREP BOWEL PREP KIT) 17.5-3.13-1.6 GM/177ML SOLN Take 1 kit by mouth as directed. 12/22/21   Levin Erp, PA  prochlorperazine (COMPAZINE) 10 MG tablet Take 1 tablet (10 mg total) by mouth every 6 (six) hours as needed. 12/16/21   Heilingoetter, Cassandra L, PA-C    Current Outpatient Medications  Medication Sig Dispense Refill   amLODipine (NORVASC) 5 MG tablet Take 1 tablet (5 mg total) by mouth daily. 90 tablet 3   capecitabine (XELODA) 500 MG tablet Take 3 tablets (1,500 mg total) by mouth 2 (two) times daily after a meal. Take for 14 days on, 7 days off. Repeat every 21 days. (Patient not taking: Reported on 12/23/2021) 84 tablet 1   gabapentin (NEURONTIN) 100 MG capsule Take 1 capsule (100 mg total) by mouth 3 (three) times daily as needed. 90 capsule 2   lisinopril-hydrochlorothiazide (ZESTORETIC) 20-12.5 MG tablet Take 2 tablets by mouth daily. 60 tablet 3   Multiple Vitamins-Minerals (MULTIVITAMIN WITH MINERALS) tablet Take 1 tablet by mouth daily. Gummy 50 mg     Na  Sulfate-K Sulfate-Mg Sulf (SUPREP BOWEL PREP KIT) 17.5-3.13-1.6 GM/177ML SOLN Take 1 kit by mouth as directed. 354 mL 0   prochlorperazine (COMPAZINE) 10 MG tablet Take 1 tablet (10 mg total) by mouth every 6 (six) hours as needed. 30 tablet 2   No current facility-administered medications for this visit.    Allergies as of 01/31/2022   (Not on File)    Family History  Problem Relation Age of Onset   Headache Son    Colon cancer Neg Hx    Stomach  cancer Neg Hx    Esophageal cancer Neg Hx     Social History   Socioeconomic History   Marital status: Married    Spouse name: Scientist, product/process development   Number of children: Not on file   Years of education: Not on file   Highest education level: Not on file  Occupational History   Not on file  Tobacco Use   Smoking status: Never   Smokeless tobacco: Never  Vaping Use   Vaping Use: Never used  Substance and Sexual Activity   Alcohol use: No   Drug use: No   Sexual activity: Not on file  Other Topics Concern   Not on file  Social History Narrative   Came to [place] from [place] in 2013.   Does not know much family history [place].   Social Determinants of Health   Financial Resource Strain: Not on file  Food Insecurity: Not on file  Transportation Needs: Not on file  Physical Activity: Not on file  Stress: Not on file  Social Connections: Not on file  Intimate Partner Violence: Not At Risk (12/22/2020)   Humiliation, Afraid, Rape, and Kick questionnaire    Fear of Current or Ex-Partner: No    Emotionally Abused: No    Physically Abused: No    Sexually Abused: No    Review of Systems:  All other review of systems negative except as mentioned in the HPI.  Physical Exam: Vital signs in last 24 hours: BP 113/67 (BP Location: Right Arm, Patient Position: Sitting, Cuff Size: Small)   Pulse 92   Temp 98.2 F (36.8 C) (Temporal)   Ht '4\' 11"'  (1.499 m)   Wt 112 lb (50.8 kg)   SpO2 98%   BMI 22.62 kg/m  General:   Alert, NAD Lungs:  Clear .   Heart:  Regular rate and rhythm Abdomen:  Soft, nontender and nondistended. Neuro/Psych:  Alert and cooperative. Normal mood and affect. A and O x 3  Reviewed labs, radiology imaging, old records and pertinent past GI work up  Patient is appropriate for planned procedure(s) and anesthesia in an ambulatory setting   K. Denzil Magnuson , MD [phone]

## 2022-01-31 NOTE — Congregational Nurse Program (Signed)
Home visit with interpreter Raynaldo Opitz.  Patient returned home from colonoscopy and we reviewed post-procedure instructions. She will call interpreter if she has bleeding or other concerns in next 24 hours. Patient states she has toothache right upper posterior present for 2 days.  CN will initiate dental referral through [facility] orange card.  Jake Michaelis RN, Congregational Nurse [phone].

## 2022-01-31 NOTE — Op Note (Signed)
[facility] Patient Name: Jillian Lutz Procedure Date: 01/31/2022 2:42 PM MRN: [id number] Endoscopist: Mauri Pole , MD Age: 71 Referring MD:  Date of Birth: 01/01/1951 Gender: Female Account #: [id number] Procedure:                Colonoscopy Indications:              Abnormal PET scan of the GI tract, Abdominal pain                            in the left lower quadrant, Change in bowel habits,                            Change in stool caliber, Rectal mass Medicines:                Monitored Anesthesia Care Procedure:                Pre-Anesthesia Assessment:                           - Prior to the procedure, a History and Physical                            was performed, and patient medications and                            allergies were reviewed. The patient's tolerance of                            previous anesthesia was also reviewed. The risks                            and benefits of the procedure and the sedation                            options and risks were discussed with the patient.                            All questions were answered, and informed consent                            was obtained. Prior Anticoagulants: The patient has                            taken no previous anticoagulant or antiplatelet                            agents. ASA Grade Assessment: III - A patient with                            severe systemic disease. After reviewing the risks                            and benefits, the patient was deemed in  satisfactory condition to undergo the procedure.                           After obtaining informed consent, the colonoscope                            was passed under direct vision. Throughout the                            procedure, the patient's blood pressure, pulse, and                            oxygen saturations were monitored continuously. The                            Olympus  PCF-H190DL (#[id number]) Colonoscope was                            introduced through the anus and advanced to the the                            cecum, identified by appendiceal orifice and                            ileocecal valve. The colonoscopy was technically                            difficult and complex due to a partially                            obstructing mass. The patient tolerated the                            procedure well. The quality of the bowel                            preparation was fair. The ileocecal valve,                            appendiceal orifice, and rectum were photographed. Scope In: 3:11:24 PM Scope Out: 3:24:46 PM Scope Withdrawal Time: 0 hours 5 minutes 53 seconds  Total Procedure Duration: 0 hours 13 minutes 22 seconds  Findings:                 Hemorrhoids were found on perianal exam.                           An infiltrative partially obstructing large mass                            was found in the rectum. The mass was                            circumferential. The mass measured ten cm in  length, extending from 5-15cm from anal verge. In                            addition, rectal diameter at narrowest approx                            twelve mm. Oozing was present. Biopsies were taken                            with a cold forceps for histology.                           Non-bleeding external and internal hemorrhoids were                            found. retroflexion not performed. The hemorrhoids                            were medium-sized. Complications:            No immediate complications. Estimated Blood Loss:     Estimated blood loss was minimal. Impression:               - Preparation of the colon was fair.                           - Hemorrhoids found on perianal exam.                           - Likely malignant partially obstructing tumor in                            the rectum. Biopsied.                            - Non-bleeding external and internal hemorrhoids. Recommendation:           - Patient has a contact number available for                            emergencies. The signs and symptoms of potential                            delayed complications were discussed with the                            patient. Return to normal activities tomorrow.                            Written discharge instructions were provided to the                            patient.                           - Mechanical soft diet indefinitely.                           -  Continue present medications.                           - Await pathology results.                           - Follow up with Oncology Mauri Pole, MD 01/31/2022 3:32:06 PM This report has been signed electronically.

## 2022-02-01 ENCOUNTER — Telehealth: Payer: Self-pay

## 2022-02-01 ENCOUNTER — Telehealth: Payer: Self-pay | Admitting: *Deleted

## 2022-02-01 DIAGNOSIS — K0889 Other specified disorders of teeth and supporting structures: Secondary | ICD-10-CM

## 2022-02-01 NOTE — Telephone Encounter (Signed)
No answer on follow up call. 

## 2022-02-01 NOTE — Telephone Encounter (Signed)
Phone call to patient for conference call with Dr. Nyoka Cowden and interpreter [name] to discuss results of yesterday's colonoscopy.  Patient expressed understanding that she has stage 4 colorectal cancer and that it has metastasized.  She agreed to see Dr. Burr Medico and follow her recommendations for treatment.  Jake Michaelis RN, Congregational Nurse [phone]

## 2022-02-01 NOTE — Telephone Encounter (Signed)
Call from Doristine Counter  patient has a severe tooth ache x 2 days.  Cavity noted in right upper Molar.  Needs referral to [facility] for Dental referral.  Has an [employer].  Order can be faxed to [phone].  Send in as an Emergent visit.

## 2022-02-13 ENCOUNTER — Encounter: Payer: Self-pay | Admitting: Licensed Clinical Social Worker

## 2022-02-13 ENCOUNTER — Inpatient Hospital Stay (HOSPITAL_BASED_OUTPATIENT_CLINIC_OR_DEPARTMENT_OTHER): Payer: Self-pay | Admitting: Hematology

## 2022-02-13 ENCOUNTER — Telehealth: Payer: Self-pay | Admitting: Pharmacist

## 2022-02-13 ENCOUNTER — Inpatient Hospital Stay: Payer: Self-pay | Attending: Physician Assistant

## 2022-02-13 ENCOUNTER — Encounter: Payer: Self-pay | Admitting: Hematology

## 2022-02-13 ENCOUNTER — Other Ambulatory Visit: Payer: Self-pay

## 2022-02-13 VITALS — BP 135/79 | HR 66 | Temp 98.3°F | Resp 18 | Ht 59.0 in | Wt 116.1 lb

## 2022-02-13 DIAGNOSIS — G4700 Insomnia, unspecified: Secondary | ICD-10-CM | POA: Insufficient documentation

## 2022-02-13 DIAGNOSIS — K5900 Constipation, unspecified: Secondary | ICD-10-CM | POA: Insufficient documentation

## 2022-02-13 DIAGNOSIS — C20 Malignant neoplasm of rectum: Secondary | ICD-10-CM

## 2022-02-13 DIAGNOSIS — I10 Essential (primary) hypertension: Secondary | ICD-10-CM | POA: Insufficient documentation

## 2022-02-13 DIAGNOSIS — E1140 Type 2 diabetes mellitus with diabetic neuropathy, unspecified: Secondary | ICD-10-CM | POA: Insufficient documentation

## 2022-02-13 LAB — CBC WITH DIFFERENTIAL (CANCER CENTER ONLY)
Abs Immature Granulocytes: 0.04 10*3/uL (ref 0.00–0.07)
Basophils Absolute: 0.1 10*3/uL (ref 0.0–0.1)
Basophils Relative: 1 %
Eosinophils Absolute: 0.2 10*3/uL (ref 0.0–0.5)
Eosinophils Relative: 3 %
HCT: 33.7 % — ABNORMAL LOW (ref 36.0–46.0)
Hemoglobin: 11 g/dL — ABNORMAL LOW (ref 12.0–15.0)
Immature Granulocytes: 1 %
Lymphocytes Relative: 12 %
Lymphs Abs: 0.9 10*3/uL (ref 0.7–4.0)
MCH: 25 pg — ABNORMAL LOW (ref 26.0–34.0)
MCHC: 32.6 g/dL (ref 30.0–36.0)
MCV: 76.6 fL — ABNORMAL LOW (ref 80.0–100.0)
Monocytes Absolute: 0.6 10*3/uL (ref 0.1–1.0)
Monocytes Relative: 8 %
Neutro Abs: 5.8 10*3/uL (ref 1.7–7.7)
Neutrophils Relative %: 75 %
Platelet Count: 261 10*3/uL (ref 150–400)
RBC: 4.4 MIL/uL (ref 3.87–5.11)
RDW: 16.1 % — ABNORMAL HIGH (ref 11.5–15.5)
WBC Count: 7.6 10*3/uL (ref 4.0–10.5)
nRBC: 0 % (ref 0.0–0.2)

## 2022-02-13 LAB — CMP (CANCER CENTER ONLY)
ALT: 6 U/L (ref 0–44)
AST: 14 U/L — ABNORMAL LOW (ref 15–41)
Albumin: 3.6 g/dL (ref 3.5–5.0)
Alkaline Phosphatase: 54 U/L (ref 38–126)
Anion gap: 5 (ref 5–15)
BUN: 16 mg/dL (ref 8–23)
CO2: 30 mmol/L (ref 22–32)
Calcium: 8.4 mg/dL — ABNORMAL LOW (ref 8.9–10.3)
Chloride: 103 mmol/L (ref 98–111)
Creatinine: 0.72 mg/dL (ref 0.44–1.00)
GFR, Estimated: >60 mL/min (ref >60)
Glucose, Bld: 142 mg/dL — ABNORMAL HIGH (ref 70–99)
Potassium: 3.9 mmol/L (ref 3.5–5.1)
Sodium: 138 mmol/L (ref 135–145)
Total Bilirubin: 0.5 mg/dL (ref 0.3–1.2)
Total Protein: 6.7 g/dL (ref 6.5–8.1)

## 2022-02-13 LAB — FERRITIN: Ferritin: 67 ng/mL (ref 11–307)

## 2022-02-13 LAB — IRON AND IRON BINDING CAPACITY (CC-WL,HP ONLY)
Iron: 58 ug/dL (ref 28–170)
Saturation Ratios: 18 % (ref 10.4–31.8)
TIBC: 325 ug/dL (ref 250–450)
UIBC: 267 ug/dL (ref 148–442)

## 2022-02-13 NOTE — Progress Notes (Signed)
[place]   Telephone:([phone]) [phone] Fax:(336) [phone]   Clinic Follow up Note   Patient Care Team: Gaylan Gerold, DO as PCP - General (Internal Medicine) Jonnie Finner, RN (Inactive) as Oncology Nurse Navigator Alla Feeling, NP as Nurse Practitioner (Nurse Practitioner) [name], MD as Consulting Physician (Hematology and Oncology) Kyung Rudd, MD as Consulting Physician (Radiation Oncology) [name], Telford Nab., MD as Consulting Physician (Gastroenterology) Garner Nash, DO as Consulting Physician (Pulmonary Disease) Michael Boston, MD as Consulting Physician (General Surgery)  Date of Service:  02/13/2022  CHIEF COMPLAINT: f/u of rectal cancer  CURRENT THERAPY:  To start Xeloda, q21d, starting 02/13/22  -dose: 1555m BID day 1-14 every 21 days   ASSESSMENT & PLAN:  Jillian Jeanbaptisteis a 71y.o. female with   1.  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 Colonoscopy 12/01/20 by Dr. NSilverio Decamp path from ascending colon polyp and rectal mass both showed adenocarcinoma.  -Staging CT CAP on 12/06/20 showed: perirectal lymph node involvement; single nonspecific left pulmonary nodule.  -Despite strong recommendation, patient firmly and repeatedly declined IV chemo and surgery, due to the fear of being sick and not able to take time off from work. She agreed to chemoRT with Xeloda, 01/04/21 - 02/14/21. She tolerated well. -she previously declined surveillance measures, however, she developed pencil-thin stool and decreased caliber of stool. Her CEA normalized after chemoRT but increased to 18.49 on 09/28/21. She agreed to work up, and CT AP on 10/07/21 showed enlargement of LLL nodule.  -Further work up with PET  11/24/21, showed: hypermetabolic LLL nodule; marked  hypermetabolism corresponding to residual rectal wall thickening; no other evidence of hypermetabolic metastasis. [place], but we agreed not to start until after biopsy confirmed disease  -bronchoscopy 12/13/21 by Dr. IValeta Harms cytology negative for malignancy. Repeat biopsy was recommended, but she declined. -her most recent CEA on 12/16/21 was 31.56. -surveillance colonoscopy 01/31/22 with Dr. NSilverio Decampshowed 10 cm partially obstructing rectal mass, 5-15 cm from anal verge. Biopsy of the mass confirmed invasive moderately differentiated adenocarcinoma. I reviewed the results and images with them today. --we discussed her treatment options. She asked about surgery, which I explained would involve removal of the rectum and surrounding lymph nodes, as well as resection of the lung metastasis. I discussed that the recommendation would be chemotherapy prior to surgery.  -she reports she does not wish to do anything. I explained this would lead to bowel obstruction and/or rectal bleeding as the cancer progresses. After lengthy discussion and clearing up confusion, she said she is willing to restart Xeloda but she does not want surgery. She previously tolerated this well and will restart. I explained she will take 15029mBID for 14 days, then off for 7 days. -I recommend a f/u chest CT as a new baseline, she finally agreed after I explained to her it's needed to evaluate her response in 3 months    2. Symptom Management: constipation, low appetite, insomnia  -she was prescribed mirtazapine on 10/11/21 for appetite and sleep. -labs from 01/02/22 showed hgb 11.2, iron 22, and ferritin WNL. Anemia is likely related to her underlying malignancy.   3. Social support -[place]. [place]30[place] soEducation officer, museum  [name] composed a letter for her to help her children get visas to visit her. -[name] is  independent with ADLs but does not have a driver's license. She works in a [employer] and is the single financial contributor in her family -She has orange card. She has been approved for free drug replacement through [facility], documents in [place]. -her husband has Alzheimer's, and she is his caregiver.  -She has Research officer, trade union and Optometrist.    4.  HTN, DM, pre-existing neuropathy -she is taking lisinopril, gabapentin, amlodipine, and metformin.     PLAN -start Xeloda tonight, she met pharmacist to [facility] today for chemo education -lab and f/u in 3 weeks before cycle 2  -I have requested [facility] on her recent rectal biopsy   No problem-specific Assessment & Plan notes found for this encounter.   SUMMARY OF ONCOLOGIC HISTORY: Oncology History Overview Note  Cancer Staging Rectal adenocarcinoma [facility]) Staging form: Colon and Rectum, AJCC 8th Edition - Clinical stage from 12/21/2020: Stage IIIB (cT3, cN1, cM0) - Unsigned    Rectal adenocarcinoma ([place])  08/26/2020 Miscellaneous   Initial presentation to PCP, reporting intermittent BRBPR since 02/2020    12/01/2020 Procedure   Colonoscopy by Dr. Silverio Decamp findings - The perianal and digital rectal examinations were normal. - A 15 mm polyp was found in the ascending colon. The polyp was semi-pedunculated. Resection and retrieval were complete. - A 25 mm polyp was found in the sigmoid colon. The polyp was pedunculated. Resection and retrieval were complete. - An infiltrative partially obstructing large mass was found in the proximal rectum. The mass was partially circumferential (involving one-half of the lumen circumference). The mass measured eight cm in length extending from 10 to 18cm from anal verge. This was biopsied with a cold forceps for histology. Proximal and distal opposite fold area of the mass lesion was tattooed with an injection of total 3 mL of Spot (carbon black).   12/01/2020 Initial Biopsy    Diagnosis 1. Colon, polyp(s), ascending x 1 - ADENOCARCINOMA ARISING IN A TUBULAR ADENOMA WITH HIGH-GRADE DYSPLASIA. SEE NOTE 2. Colon, sigmoid polyp, x1 - TUBULOVILLOUS ADENOMA(S) - NEGATIVE FOR HIGH-GRADE DYSPLASIA OR MALIGNANCY 3. Rectum, biopsy - ADENOCARCINOMA. SEE NOTE   12/01/2020 Cancer Staging   Cancer Staging Rectal adenocarcinoma [facility]) Staging form: Colon and Rectum, AJCC 8th Edition - Clinical stage from 12/21/2020: Stage IIIB (cT3, cN1, cM0) - Unsigned    12/06/2020 Imaging   CT CAP with contrast IMPRESSION: 1. There is partially circumferential soft tissue thickening of the mid to superior rectum, approximately 5 cm in length and the inferior extent approximately 6 cm above the anal verge, consistent with primary rectal malignancy identified by colonoscopy. 2. There appear to be abnormally enlarged perirectal lymph nodes posteriorly about the superior rectum measuring up to 1.0 x 0.8 cm. Findings are suspicious for perirectal nodal metastatic disease, however rectal MRI is the test of choice for initial local staging of rectal cancer. 3. There is a 4 mm nonspecific pulmonary nodule of the superior segment left lower lobe, statistically most likely incidental, infectious or inflammatory, although nonspecific and isolated metastatic disease is not strictly excluded. Attention on follow-up. 4. No other evidence of metastatic disease in the chest, abdomen, or pelvis. Aortic Atherosclerosis (ICD10-I70.0).   12/09/2020 Imaging   Local staging MRI pelvis without contrast IMPRESSION: 4.9 cm circumferential mid/lower rectal mass, corresponding to the patient's newly diagnosed rectal cancer. Rectal adenocarcinoma T stage: T3c Rectal adenocarcinoma N stage:  N1 Distance from  tumor to the internal anal sphincter is 3.7 cm.   12/21/2020 Initial Diagnosis   Rectal adenocarcinoma ([place])   01/04/2021 -  Chemotherapy   Concurrent chemoradiation with Xeloda 1069m in the AM and  15019min the PM on days of Radiation starting 01/04/21.       01/04/2021 - 02/11/2021 Radiation Therapy   Concurrent chemoradiation with Dr MoLisbeth Renshawnd Xeloda starting 01/04/21.    01/31/2022 Procedure   Colonoscopy, Dr. NaSilverio DecampFindings: - An infiltrative partially obstructing large mass was found in the rectum. The mass was circumferential. The mass measured ten cm in length, extending from 5-15cm from anal verge. In addition, rectal diameter at narrowest approx twelve mm. Oozing was present. Biopsies were taken with a cold forceps for histology.  Impression: - Hemorrhoids found on perianal exam. - Likely malignant partially obstructing tumor in the rectum. Biopsied. - Non-bleeding external and internal hemorrhoids.   01/31/2022 Pathology Results   Diagnosis Rectum, biopsy INVASIVE MODERATELY DIFFERENTIATED ADENOCARCINOMA      INTERVAL HISTORY:  Jillian Lutz here for a follow up of rectal cancer. She was last seen by me on 01/02/22. She presents to the clinic accompanied by Congressional nurse CaHoyle Sauernd interpreter.   All other systems were reviewed with the patient and are negative.  MEDICAL HISTORY:  Past Medical History:  Diagnosis Date   Bilateral wrist pain 10/25/2017   Hypertension    Neck mass 1998   Unknown biopsy results. [place]  Pre-diabetes    [place]   Trigger finger, acquired 02/08/2017    SURGICAL HISTORY: Past Surgical History:  Procedure Laterality Date   BRONCHIAL BIOPSY  12/13/2021   Procedure: BRONCHIAL BIOPSIES;  Surgeon: IcGarner NashDO;  [facility];  Service: Pulmonary;;   BRONCHIAL NEEDLE ASPIRATION BIOPSY  12/13/2021   Procedure: BRONCHIAL NEEDLE ASPIRATION BIOPSIES;  Surgeon: IcGarner NashDO;  [facility];  Service: Pulmonary;;   NECK SURGERY Left 1998   Excision of [place] in [place] VIDEO BRONCHOSCOPY WITH RADIAL ENDOBRONCHIAL ULTRASOUND  12/13/2021   Procedure: RADIAL ENDOBRONCHIAL  ULTRASOUND;  Surgeon: IcGarner NashDO;  [facility];  Service: Pulmonary;;    I have reviewed the social history and family history with the patient and they are unchanged from previous note.  ALLERGIES:  is allergic to no known allergies.  MEDICATIONS:  Current Outpatient Medications  Medication Sig Dispense Refill   amLODipine (NORVASC) 5 MG tablet Take 1 tablet (5 mg total) by mouth daily. 90 tablet 3   capecitabine (XELODA) 500 MG tablet Take 3 tablets (1,500 mg total) by mouth 2 (two) times daily after a meal. Take for 14 days on, 7 days off. Repeat every 21 days. 84 tablet 1   gabapentin (NEURONTIN) 100 MG capsule Take 1 capsule (100 mg total) by mouth 3 (three) times daily as needed. 90 capsule 2   lisinopril-hydrochlorothiazide (ZESTORETIC) 20-12.5 MG tablet Take 2 tablets by mouth daily. 60 tablet 3   Multiple Vitamins-Minerals (MULTIVITAMIN WITH MINERALS) tablet Take 1 tablet by mouth daily. Gummy 50 mg     prochlorperazine (COMPAZINE) 10 MG tablet Take 1 tablet (10 mg total) by mouth every 6 (six) hours as needed. 30 tablet 2   No current facility-administered medications for this visit.    PHYSICAL EXAMINATION: ECOG PERFORMANCE STATUS: 1 - Symptomatic but completely ambulatory  Vitals:   02/13/22 0906  BP: 135/79  Pulse: 66  Resp: 18  Temp: 98.3 F (  36.8 C)  SpO2: 97%   Wt Readings from Last 3 Encounters:  02/13/22 116 lb 1.6 oz (52.7 kg)  01/31/22 112 lb (50.8 kg)  01/25/22 114 lb 9.6 oz (52 kg)     GENERAL:alert, no distress and comfortable SKIN: skin color normal, no rashes or significant lesions EYES: normal, Conjunctiva are pink and non-injected, sclera clear  NEURO: alert & oriented x 3 with fluent speech  LABORATORY DATA:  I have reviewed the data as listed    Latest Ref Rng & Units 02/13/2022    8:39 AM 01/02/2022   11:52 AM 12/16/2021    9:33 AM  CBC  WBC 4.0 - 10.5 K/uL 7.6  6.7  7.0   Hemoglobin 12.0 - 15.0 g/dL 11.0  11.2  11.0    Hematocrit 36.0 - 46.0 % 33.7  34.1  33.6   Platelets 150 - 400 K/uL 261  275  240         Latest Ref Rng & Units 02/13/2022    8:39 AM 01/02/2022   11:52 AM 12/16/2021    9:33 AM  CMP  Glucose 70 - 99 mg/dL 142  108  124   BUN 8 - 23 mg/dL _0 Creatinine 0.44 - 1.00 mg/dL 0.72  0.75  0.71   Sodium 135 - 145 mmol/L 138  140  138   Potassium 3.5 - 5.1 mmol/L 3.9  4.0  3.8   Chloride 98 - 111 mmol/L 103  106  103   CO2 22 - 32 mmol/L _1 Calcium 8.9 - 10.3 mg/dL 8.4  9.5  9.2   Total Protein 6.5 - 8.1 g/dL 6.7  6.8  6.5   Total Bilirubin 0.3 - 1.2 mg/dL 0.5  0.3  0.4   Alkaline Phos 38 - 126 U/L 54  52  52   AST 15 - 41 U/L _2 ALT 0 - 44 U/L 6  <5  6       RADIOGRAPHIC STUDIES: I have personally reviewed the radiological images as listed and agreed with the findings in the report. No results found.    No orders of the defined types were placed in this encounter.  All questions were answered. The patient knows to call the clinic with any problems, questions or concerns. No barriers to learning was detected. The total time spent in the appointment was 45 minutes.     [name], MD 02/13/2022   I, Wilburn Mylar, am acting as scribe for [name], MD.   I have reviewed the above documentation for accuracy and completeness, and I agree with the above.

## 2022-02-13 NOTE — Telephone Encounter (Signed)
Oral Chemotherapy Pharmacist Encounter  I met with patient, patient's congregational nurse, Jake Michaelis, RN, and interpreter for overview of: Xeloda (capecitabine) for the treatment of rectal cancer, planned duration until disease progression or unacceptable drug toxicity.  Counseled patient on administration, dosing, side effects, monitoring, drug-food interactions, safe handling, storage, and disposal.  Patient will take Xeloda 545m tablets, 3 tablets (15087m by mouth in AM and 3 tabs (150043mby mouth in PM, within 30 minutes of finishing meals, for 14 days on, 7 days off, repeated every 21 days.  Xeloda start date: 02/13/22 PM  Adverse effects include but are not limited to: fatigue, decreased blood counts, GI upset, diarrhea, mouth sores, and hand-foot syndrome. Patient has anti-emetic on hand and knows to take it if nausea develops.   Patient will obtain anti diarrheal and alert the office of 4 or more loose stools above baseline.  Reviewed with patient importance of keeping a medication schedule and plan for any missed doses. No barriers to medication adherence identified.  Medication reconciliation performed and medication/allergy list updated. No drug-drug interactions identified between patient's current medication list and Xeloda.   Patient has first fill of Xeloda from [facility] at home. Patient is aware that [facility] need to be called ([phone] ensure delivery of medication each month. [facility] Hindersould be called no later than 7 days prior to patient needing next refill.   All questions answered.  Ms. NieHumphreysiced understanding and appreciation.   Medication education handout given to RN. Patient and patient's care team knows to call the office with questions or concerns. Oral Chemotherapy Clinic phone number provided.  RebLeron CroakharmD, BCPS, BCOP Hematology/Oncology Clinical Pharmacist WesElvina Sidled [phone]/2023 10:10 AM

## 2022-02-13 NOTE — Progress Notes (Signed)
[facility] CSW Progress Note  Clinical Education officer, museum  received request to provide pt with a letter for immigration  to request a family member be approved for a visa to assist w/ pt's care while undergoing treatment.  Pt provided w/ letter.  CSW to remain available to assist as appropriate throughout duration of treament.    Henriette Combs, LCSW

## 2022-02-28 ENCOUNTER — Telehealth: Payer: Self-pay

## 2022-02-28 NOTE — Telephone Encounter (Signed)
Phone Call to [facility].  Ordered Xeloda 500 mg refill (RX #  [id number]).  Medication will be delivered to patient's home by [employer] on Friday 03/03/2022.  Order# [id number].  Jake Michaelis RN, Congregational Nurse [phone].

## 2022-03-01 ENCOUNTER — Other Ambulatory Visit: Payer: Self-pay | Admitting: Student

## 2022-03-01 ENCOUNTER — Inpatient Hospital Stay: Payer: Self-pay | Attending: Physician Assistant

## 2022-03-01 ENCOUNTER — Encounter: Payer: Self-pay | Admitting: Hematology

## 2022-03-01 ENCOUNTER — Inpatient Hospital Stay (HOSPITAL_BASED_OUTPATIENT_CLINIC_OR_DEPARTMENT_OTHER): Payer: Self-pay | Admitting: Hematology

## 2022-03-01 ENCOUNTER — Other Ambulatory Visit (HOSPITAL_COMMUNITY): Payer: Self-pay

## 2022-03-01 ENCOUNTER — Other Ambulatory Visit: Payer: Self-pay

## 2022-03-01 VITALS — BP 143/80 | HR 63 | Temp 98.0°F | Resp 18 | Wt 118.1 lb

## 2022-03-01 DIAGNOSIS — I10 Essential (primary) hypertension: Secondary | ICD-10-CM

## 2022-03-01 DIAGNOSIS — C20 Malignant neoplasm of rectum: Secondary | ICD-10-CM

## 2022-03-01 DIAGNOSIS — E1140 Type 2 diabetes mellitus with diabetic neuropathy, unspecified: Secondary | ICD-10-CM | POA: Insufficient documentation

## 2022-03-01 LAB — CMP (CANCER CENTER ONLY)
ALT: 6 U/L (ref 0–44)
AST: 14 U/L — ABNORMAL LOW (ref 15–41)
Albumin: 3.6 g/dL (ref 3.5–5.0)
Alkaline Phosphatase: 51 U/L (ref 38–126)
Anion gap: 7 (ref 5–15)
BUN: 14 mg/dL (ref 8–23)
CO2: 27 mmol/L (ref 22–32)
Calcium: 9.1 mg/dL (ref 8.9–10.3)
Chloride: 107 mmol/L (ref 98–111)
Creatinine: 0.75 mg/dL (ref 0.44–1.00)
GFR, Estimated: >60 mL/min (ref >60)
Glucose, Bld: 126 mg/dL — ABNORMAL HIGH (ref 70–99)
Potassium: 3.6 mmol/L (ref 3.5–5.1)
Sodium: 141 mmol/L (ref 135–145)
Total Bilirubin: 0.3 mg/dL (ref 0.3–1.2)
Total Protein: 7 g/dL (ref 6.5–8.1)

## 2022-03-01 LAB — CBC WITH DIFFERENTIAL (CANCER CENTER ONLY)
Abs Immature Granulocytes: 0.02 10*3/uL (ref 0.00–0.07)
Basophils Absolute: 0 10*3/uL (ref 0.0–0.1)
Basophils Relative: 0 %
Eosinophils Absolute: 0 10*3/uL (ref 0.0–0.5)
Eosinophils Relative: 0 %
HCT: 34.5 % — ABNORMAL LOW (ref 36.0–46.0)
Hemoglobin: 11.6 g/dL — ABNORMAL LOW (ref 12.0–15.0)
Immature Granulocytes: 0 %
Lymphocytes Relative: 13 %
Lymphs Abs: 0.8 10*3/uL (ref 0.7–4.0)
MCH: 25.6 pg — ABNORMAL LOW (ref 26.0–34.0)
MCHC: 33.6 g/dL (ref 30.0–36.0)
MCV: 76.2 fL — ABNORMAL LOW (ref 80.0–100.0)
Monocytes Absolute: 0.4 10*3/uL (ref 0.1–1.0)
Monocytes Relative: 7 %
Neutro Abs: 4.6 10*3/uL (ref 1.7–7.7)
Neutrophils Relative %: 80 %
Platelet Count: 297 10*3/uL (ref 150–400)
RBC: 4.53 MIL/uL (ref 3.87–5.11)
RDW: 16.8 % — ABNORMAL HIGH (ref 11.5–15.5)
WBC Count: 5.8 10*3/uL (ref 4.0–10.5)
nRBC: 0 % (ref 0.0–0.2)

## 2022-03-01 LAB — IRON AND IRON BINDING CAPACITY (CC-WL,HP ONLY)
Iron: 98 ug/dL (ref 28–170)
Saturation Ratios: 27 % (ref 10.4–31.8)
TIBC: 360 ug/dL (ref 250–450)
UIBC: 262 ug/dL

## 2022-03-01 LAB — FERRITIN: Ferritin: 76 ng/mL (ref 11–307)

## 2022-03-01 NOTE — Progress Notes (Signed)
[place]   [phone] Fax:([phone]) [phone]   Clinic Follow up Note   Patient Care Team: Gaylan Gerold, DO as PCP - General (Internal Medicine) Jonnie Finner, RN (Inactive) as Oncology Nurse Navigator Alla Feeling, NP as Nurse Practitioner (Nurse Practitioner) [name], MD as Consulting Physician (Hematology and Oncology) Kyung Rudd, MD as Consulting Physician (Radiation Oncology) [name], Telford Nab., MD as Consulting Physician (Gastroenterology) Garner Nash, DO as Consulting Physician (Pulmonary Disease) Michael Boston, MD as Consulting Physician (General Surgery)  Date of Service:  03/01/2022  CHIEF COMPLAINT: f/u of rectal cancer  CURRENT THERAPY:  Xeloda, q21d, starting 02/16/22             -dose: $Remov'1500mg'Jyquyr$  BID days 1-14   ASSESSMENT & PLAN:  Jillian Lutz is a 71 y.o. female with   1.  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 Colonoscopy 12/01/20 by Dr. Silverio Decamp, path from ascending colon polyp and rectal mass both showed adenocarcinoma.  -Staging CT CAP on 12/06/20 showed: perirectal lymph node involvement; single nonspecific left pulmonary nodule.  -Despite strong recommendation, patient firmly and repeatedly declined IV chemo and surgery, due to the fear of being sick and not able to take time off from work. She agreed to chemoRT with Xeloda, 01/04/21 - 02/14/21. She tolerated well. -she previously declined surveillance measures, however, she developed pencil-thin stool and decreased caliber of stool. Her CEA normalized after chemoRT but increased to 18.49 on 09/28/21. She agreed to work up, and CT AP on 10/07/21 showed enlargement of LLL nodule.  -Further work up with PET  11/24/21, showed: hypermetabolic LLL nodule; marked hypermetabolism  corresponding to residual rectal wall thickening; no other evidence of hypermetabolic metastasis. I previously called in [place] for her on 11/25/21, but we agreed not to start until after biopsy confirmed disease  -bronchoscopy 12/13/21 by Dr. Valeta Harms, cytology negative for malignancy. Repeat biopsy was recommended, but she declined. -her most recent CEA on 12/16/21 was 31.56. -surveillance colonoscopy 01/31/22 with Dr. Silverio Decamp showed 10 cm partially obstructing rectal mass, 5-15 cm from anal verge. Biopsy of the mass confirmed invasive moderately differentiated adenocarcinoma.  -she previously declined restaging scan. We discussed again today that the scan is used to determine if she is responding to treatment. She expressed concern about learning that her cancer is growing. We reached a compromise and agreed to proceed with another scan in 3 months to see how she is responding; I ordered today. -she agreed to restart Xeloda and started on 02/16/22. She reports she has more energy, and she has gained some weight back. She has 18 pills remaining, despite today being day 14. I advised her to continue to the medicine until she's done, then start second cycle on 03/13/22.   2. Symptom Management: constipation, low appetite, insomnia  -she was prescribed mirtazapine on 10/11/21 for appetite and sleep. -labs stable to improved   3. Social support -She and her husband moved from [place] in 2014. Her children remain in [place], spanning ages 69-50. Our social worker [name] composed a letter for her to help her children get visas to visit her. -Ms. Sima is independent with ADLs but does not have a driver's license. She works in a [employer] and is the single financial contributor in her family -She has orange card. She has been approved for  free drug replacement through [facility], documents in Epic. -her husband has Alzheimer's, and she is his caregiver.  -She has Research officer, trade union and Optometrist.    4.  HTN,  DM, pre-existing neuropathy -she is taking lisinopril, gabapentin, amlodipine, and metformin.     PLAN -continue Xeloda to finish current bottle in 2-3 days. She will start next cycle 8/28 -lab and f/u week of 9/11 -plan to repeat CT CAP w contrast in 3 months    No problem-specific Assessment & Plan notes found for this encounter.   SUMMARY OF ONCOLOGIC HISTORY: Oncology History Overview Note  Cancer Staging Rectal adenocarcinoma [facility]) Staging form: Colon and Rectum, AJCC 8th Edition - Clinical stage from 12/21/2020: Stage IIIB (cT3, cN1, cM0) - Unsigned    Rectal adenocarcinoma ([place])  08/26/2020 Miscellaneous   Initial presentation to PCP, reporting intermittent BRBPR since 02/2020    12/01/2020 Procedure   Colonoscopy by Dr. Silverio Decamp findings - The perianal and digital rectal examinations were normal. - A 15 mm polyp was found in the ascending colon. The polyp was semi-pedunculated. Resection and retrieval were complete. - A 25 mm polyp was found in the sigmoid colon. The polyp was pedunculated. Resection and retrieval were complete. - An infiltrative partially obstructing large mass was found in the proximal rectum. The mass was partially circumferential (involving one-half of the lumen circumference). The mass measured eight cm in length extending from 10 to 18cm from anal verge. This was biopsied with a cold forceps for histology. Proximal and distal opposite fold area of the mass lesion was tattooed with an injection of total 3 mL of Spot (carbon black).   12/01/2020 Initial Biopsy   Diagnosis 1. Colon, polyp(s), ascending x 1 - ADENOCARCINOMA ARISING IN A TUBULAR ADENOMA WITH HIGH-GRADE DYSPLASIA. SEE NOTE 2. Colon, sigmoid polyp, x1 - TUBULOVILLOUS ADENOMA(S) - NEGATIVE FOR HIGH-GRADE DYSPLASIA OR MALIGNANCY 3. Rectum, biopsy - ADENOCARCINOMA. SEE NOTE   12/01/2020 Cancer Staging   Cancer Staging Rectal adenocarcinoma  [facility]) Staging form: Colon and Rectum, AJCC 8th  Edition - Clinical stage from 12/21/2020: Stage IIIB (cT3, cN1, cM0) - Unsigned    12/06/2020 Imaging   CT CAP with contrast IMPRESSION: 1. There is partially circumferential soft tissue thickening of the mid to superior rectum, approximately 5 cm in length and the inferior extent approximately 6 cm above the anal verge, consistent with primary rectal malignancy identified by colonoscopy. 2. There appear to be abnormally enlarged perirectal lymph nodes posteriorly about the superior rectum measuring up to 1.0 x 0.8 cm. Findings are suspicious for perirectal nodal metastatic disease, however rectal MRI is the test of choice for initial local staging of rectal cancer. 3. There is a 4 mm nonspecific pulmonary nodule of the superior segment left lower lobe, statistically most likely incidental, infectious or inflammatory, although nonspecific and isolated metastatic disease is not strictly excluded. Attention on follow-up. 4. No other evidence of metastatic disease in the chest, abdomen, or pelvis. Aortic Atherosclerosis (ICD10-I70.0).   12/09/2020 Imaging   Local staging MRI pelvis without contrast IMPRESSION: 4.9 cm circumferential mid/lower rectal mass, corresponding to the patient's newly diagnosed rectal cancer. Rectal adenocarcinoma T stage: T3c Rectal adenocarcinoma N stage:  N1 Distance from tumor to the internal anal sphincter is 3.7 cm.   12/21/2020 Initial Diagnosis   Rectal adenocarcinoma ([place])   01/04/2021 -  Chemotherapy   Concurrent chemoradiation with Xeloda 1074m in the AM and 15011min the PM on days of Radiation starting 01/04/21.  01/04/2021 - 02/11/2021 Radiation Therapy   Concurrent chemoradiation with Dr Lisbeth Renshaw and Xeloda starting 01/04/21.    01/31/2022 Procedure   Colonoscopy, Dr. Silverio Decamp  Findings: - An infiltrative partially obstructing large mass was found in the rectum. The mass was circumferential. The mass measured ten cm in length, extending from  5-15cm from anal verge. In addition, rectal diameter at narrowest approx twelve mm. Oozing was present. Biopsies were taken with a cold forceps for histology.  Impression: - Hemorrhoids found on perianal exam. - Likely malignant partially obstructing tumor in the rectum. Biopsied. - Non-bleeding external and internal hemorrhoids.   01/31/2022 Pathology Results   Diagnosis Rectum, biopsy INVASIVE MODERATELY DIFFERENTIATED ADENOCARCINOMA      INTERVAL HISTORY:  Jillian Lutz is here for a follow up of rectal cancer. She was last seen by me on 02/13/22. She presents to the clinic accompanied by congressional nurse [name] and interpreter. She reports she is feeling better. She explains she has more energy and has gained some weight. She also reports her BM have been good, denies hematochezia. She has 18 pills left but reports today is her last day of the two weeks. She notes she only missed one day.   All other systems were reviewed with the patient and are negative.  MEDICAL HISTORY:  Past Medical History:  Diagnosis Date   Bilateral wrist pain 10/25/2017   Hypertension    Neck mass 1998   Unknown biopsy results. Excised in [place].   Pre-diabetes    Rectal cancer ([place])    Trigger finger, acquired 02/08/2017    SURGICAL HISTORY: Past Surgical History:  Procedure Laterality Date   BRONCHIAL BIOPSY  12/13/2021   Procedure: BRONCHIAL BIOPSIES;  Surgeon: Garner Nash, DO;  Location: [place] [facility];  Service: Pulmonary;;   BRONCHIAL NEEDLE ASPIRATION BIOPSY  12/13/2021   Procedure: BRONCHIAL NEEDLE ASPIRATION BIOPSIES;  Surgeon: Garner Nash, DO;  Location: [place] [facility];  Service: Pulmonary;;   NECK SURGERY Left 1998   Excision of mass in [place]   VIDEO BRONCHOSCOPY WITH RADIAL ENDOBRONCHIAL ULTRASOUND  12/13/2021   Procedure: RADIAL ENDOBRONCHIAL ULTRASOUND;  Surgeon: Garner Nash, DO;  Location: [facility];  Service: Pulmonary;;    I have reviewed the social history  and family history with the patient and they are unchanged from previous note.  ALLERGIES:  is allergic to no known allergies.  MEDICATIONS:  Current Outpatient Medications  Medication Sig Dispense Refill   amLODipine (NORVASC) 5 MG tablet Take 1 tablet (5 mg total) by mouth daily. 90 tablet 3   capecitabine (XELODA) 500 MG tablet Take 3 tablets (1,500 mg total) by mouth 2 (two) times daily after a meal. Take for 14 days on, 7 days off. Repeat every 21 days. 84 tablet 1   gabapentin (NEURONTIN) 100 MG capsule Take 1 capsule (100 mg total) by mouth 3 (three) times daily as needed. 90 capsule 2   lisinopril-hydrochlorothiazide (ZESTORETIC) 20-12.5 MG tablet Take 2 tablets by mouth daily. 60 tablet 3   Multiple Vitamins-Minerals (MULTIVITAMIN WITH MINERALS) tablet Take 1 tablet by mouth daily. Gummy 50 mg     prochlorperazine (COMPAZINE) 10 MG tablet Take 1 tablet (10 mg total) by mouth every 6 (six) hours as needed. 30 tablet 2   No current facility-administered medications for this visit.    PHYSICAL EXAMINATION: ECOG PERFORMANCE STATUS: 1 - Symptomatic but completely ambulatory  Vitals:   03/01/22 1328  BP: (!) 143/80  Pulse: 63  Resp: 18  Temp: 98 F (36.7  C)  SpO2: 100%   Wt Readings from Last 3 Encounters:  03/01/22 118 lb 1.6 oz (53.6 kg)  02/13/22 116 lb 1.6 oz (52.7 kg)  01/31/22 112 lb (50.8 kg)     GENERAL:alert, no distress and comfortable SKIN: skin color normal, no rashes or significant lesions EYES: normal, Conjunctiva are pink and non-injected, sclera clear  NEURO: alert & oriented x 3 with fluent speech  LABORATORY DATA:  I have reviewed the data as listed    Latest Ref Rng & Units 03/01/2022    1:04 PM 02/13/2022    8:39 AM 01/02/2022   11:52 AM  CBC  WBC 4.0 - 10.5 K/uL 5.8  7.6  6.7   Hemoglobin 12.0 - 15.0 g/dL 11.6  11.0  11.2   Hematocrit 36.0 - 46.0 % 34.5  33.7  34.1   Platelets 150 - 400 K/uL 297  261  275         Latest Ref Rng & Units  03/01/2022    1:04 PM 02/13/2022    8:39 AM 01/02/2022   11:52 AM  CMP  Glucose 70 - 99 mg/dL 126  142  108   BUN 8 - 23 mg/dL _0 Creatinine 0.44 - 1.00 mg/dL 0.75  0.72  0.75   Sodium 135 - 145 mmol/L 141  138  140   Potassium 3.5 - 5.1 mmol/L 3.6  3.9  4.0   Chloride 98 - 111 mmol/L 107  103  106   CO2 22 - 32 mmol/L _1 Calcium 8.9 - 10.3 mg/dL 9.1  8.4  9.5   Total Protein 6.5 - 8.1 g/dL 7.0  6.7  6.8   Total Bilirubin 0.3 - 1.2 mg/dL 0.3  0.5  0.3   Alkaline Phos 38 - 126 U/L 51  54  52   AST 15 - 41 U/L _2 ALT 0 - 44 U/L 6  6  <5       RADIOGRAPHIC STUDIES: I have personally reviewed the radiological images as listed and agreed with the findings in the report. No results found.    Orders Placed This Encounter  Procedures   CT CHEST ABDOMEN PELVIS W CONTRAST    Standing Status:   Future    Standing Expiration Date:   03/02/2023    Order Specific Question:   Preferred imaging location?    Answer:   [facility]    Order Specific Question:   Release to patient    Answer:   Immediate    Order Specific Question:   Is Oral Contrast requested for this exam?    Answer:   Yes, Per Radiology protocol   All questions were answered. The patient knows to call the clinic with any problems, questions or concerns. No barriers to learning was detected. The total time spent in the appointment was 30 minutes.     [name], MD 03/01/2022   I, Wilburn Mylar, am acting as scribe for [name], MD.   I have reviewed the above documentation for accuracy and completeness, and I agree with the above.

## 2022-03-01 NOTE — Congregational Nurse Program (Signed)
CN accompanied patient to appointment with Dr. Burr Medico.  Patient reports feeling better with more energy.  Normal BM's without pain or bleeding.  Taking Xeloda and will complete first round of 2 weeks on 03/03/2022.  She will then stop for 1 week and resume on 03/13/2022.  Will follow-up with Dr. Burr Medico in 3 weeks and have repeat C-T scan in 3 months. CN told patient that Xeloda refill has been ordered and will arrive on Friday 03/03/2022.  Patient's [facility] Financial assistance expired on 02/22/1947.  Spoke with SW [name] who stated they do not assist with renewing assistance at this location.  CN will help patient complete new application for herself and her husband.  Will also request refills for BP meds and gabapentin from [facility].  Jake Michaelis RN, Congregational Nurse [phone]

## 2022-03-02 ENCOUNTER — Other Ambulatory Visit (HOSPITAL_COMMUNITY): Payer: Self-pay

## 2022-03-02 MED ORDER — LISINOPRIL-HYDROCHLOROTHIAZIDE 20-12.5 MG PO TABS
2.0000 | ORAL_TABLET | Freq: Every day | ORAL | 3 refills | Status: DC
  Filled 2022-03-02: qty 60, 30d supply, fill #0
  Filled 2022-05-08: qty 60, 30d supply, fill #1
  Filled 2022-06-05: qty 60, 30d supply, fill #2
  Filled 2022-07-03: qty 60, 30d supply, fill #3

## 2022-03-07 NOTE — Congregational Nurse Program (Signed)
Home visit to deliver the following prescriptions which were picked up from [place]:  Lisinopril-HCTZ 20/12.5 mg, Gabapentin 100 mg, Amlodipine 5 mg.  Started application for [employer] assistance.  Called employer to obtain check stubs for past 3 months.  Patient will go to bank and get bank statements for past 3 months and will call me when she has them.  States she is feeling good.  This is her week off chemo and will resume on 8/28 per Dr. Burr Medico.  Jake Michaelis RN, Congregational Nurse [phone]

## 2022-03-29 ENCOUNTER — Other Ambulatory Visit: Payer: Self-pay

## 2022-03-29 ENCOUNTER — Telehealth: Payer: Self-pay

## 2022-03-29 DIAGNOSIS — C20 Malignant neoplasm of rectum: Secondary | ICD-10-CM

## 2022-03-29 MED ORDER — CAPECITABINE 500 MG PO TABS: 1000.0000 mg/m2 | ORAL_TABLET | Freq: Two times a day (BID) | ORAL | 1 refills | Status: DC

## 2022-03-29 NOTE — Telephone Encounter (Signed)
Phone call to patient to remind of 9:00 am appointment tomorrow at [facility] and that transportation has been requested for 8:30 am thru [facility].  Jake Michaelis RN, Congregational Nurse [phone]

## 2022-03-30 ENCOUNTER — Inpatient Hospital Stay: Payer: Medicaid Other | Attending: Physician Assistant

## 2022-03-30 ENCOUNTER — Encounter: Payer: Self-pay | Admitting: Hematology

## 2022-03-30 ENCOUNTER — Telehealth: Payer: Self-pay

## 2022-03-30 ENCOUNTER — Inpatient Hospital Stay (HOSPITAL_BASED_OUTPATIENT_CLINIC_OR_DEPARTMENT_OTHER): Payer: Medicaid Other | Admitting: Hematology

## 2022-03-30 ENCOUNTER — Other Ambulatory Visit: Payer: Self-pay

## 2022-03-30 VITALS — BP 145/84 | HR 61 | Temp 98.3°F | Resp 16 | Ht 59.0 in | Wt 123.4 lb

## 2022-03-30 DIAGNOSIS — I10 Essential (primary) hypertension: Secondary | ICD-10-CM | POA: Insufficient documentation

## 2022-03-30 DIAGNOSIS — C20 Malignant neoplasm of rectum: Secondary | ICD-10-CM | POA: Insufficient documentation

## 2022-03-30 DIAGNOSIS — E1140 Type 2 diabetes mellitus with diabetic neuropathy, unspecified: Secondary | ICD-10-CM | POA: Diagnosis not present

## 2022-03-30 DIAGNOSIS — I700 Atherosclerosis of aorta: Secondary | ICD-10-CM | POA: Insufficient documentation

## 2022-03-30 LAB — CMP (CANCER CENTER ONLY)
ALT: 8 U/L (ref 0–44)
AST: 14 U/L — ABNORMAL LOW (ref 15–41)
Albumin: 3.8 g/dL (ref 3.5–5.0)
Alkaline Phosphatase: 51 U/L (ref 38–126)
Anion gap: 4 — ABNORMAL LOW (ref 5–15)
BUN: 20 mg/dL (ref 8–23)
CO2: 26 mmol/L (ref 22–32)
Calcium: 9 mg/dL (ref 8.9–10.3)
Chloride: 110 mmol/L (ref 98–111)
Creatinine: 0.62 mg/dL (ref 0.44–1.00)
GFR, Estimated: >60 mL/min (ref >60)
Glucose, Bld: 109 mg/dL — ABNORMAL HIGH (ref 70–99)
Potassium: 4 mmol/L (ref 3.5–5.1)
Sodium: 140 mmol/L (ref 135–145)
Total Bilirubin: 0.3 mg/dL (ref 0.3–1.2)
Total Protein: 6.4 g/dL — ABNORMAL LOW (ref 6.5–8.1)

## 2022-03-30 LAB — CBC WITH DIFFERENTIAL (CANCER CENTER ONLY)
Abs Immature Granulocytes: 0.07 10*3/uL (ref 0.00–0.07)
Basophils Absolute: 0 10*3/uL (ref 0.0–0.1)
Basophils Relative: 1 %
Eosinophils Absolute: 0.1 10*3/uL (ref 0.0–0.5)
Eosinophils Relative: 1 %
HCT: 36 % (ref 36.0–46.0)
Hemoglobin: 11.7 g/dL — ABNORMAL LOW (ref 12.0–15.0)
Immature Granulocytes: 1 %
Lymphocytes Relative: 9 %
Lymphs Abs: 0.6 10*3/uL — ABNORMAL LOW (ref 0.7–4.0)
MCH: 25.9 pg — ABNORMAL LOW (ref 26.0–34.0)
MCHC: 32.5 g/dL (ref 30.0–36.0)
MCV: 79.8 fL — ABNORMAL LOW (ref 80.0–100.0)
Monocytes Absolute: 0.5 10*3/uL (ref 0.1–1.0)
Monocytes Relative: 7 %
Neutro Abs: 5.4 10*3/uL (ref 1.7–7.7)
Neutrophils Relative %: 81 %
Platelet Count: 229 10*3/uL (ref 150–400)
RBC: 4.51 MIL/uL (ref 3.87–5.11)
RDW: 18.7 % — ABNORMAL HIGH (ref 11.5–15.5)
WBC Count: 6.7 10*3/uL (ref 4.0–10.5)
nRBC: 0 % (ref 0.0–0.2)

## 2022-03-30 LAB — IRON AND IRON BINDING CAPACITY (CC-WL,HP ONLY)
Iron: 83 ug/dL (ref 28–170)
Saturation Ratios: 22 % (ref 10.4–31.8)
TIBC: 382 ug/dL (ref 250–450)
UIBC: 299 ug/dL (ref 148–442)

## 2022-03-30 LAB — FERRITIN: Ferritin: 20 ng/mL (ref 11–307)

## 2022-03-30 NOTE — Telephone Encounter (Signed)
Phone call to [employer].  Requested Xeloda 500 mg be delivered to patient's home.  Scheduled for delivery tomorrow 03/31/2022 by [employer].  Order # [id number].  Ciro Backer, [place] Nurse [phone]

## 2022-03-30 NOTE — Telephone Encounter (Signed)
Phone call to [facility]iology scheduling.  Made appointment for C-T scan on 04/17/2022 @ 9:30 am.  Will schedule transportation and visit patient prior to appointment with interpreter [name] to review instructions.  Jake Michaelis RN, Congregational Nurse [phone]

## 2022-03-30 NOTE — Progress Notes (Signed)
[place]   [phone] [phone]   Clinic Follow up Note   Patient Care Team: Gaylan Gerold, DO as PCP - General (Internal Medicine) Jonnie Finner, RN (Inactive) as Oncology Nurse Navigator Alla Feeling, NP as Nurse Practitioner (Nurse Practitioner) [name], MD as Consulting Physician (Hematology and Oncology) Kyung Rudd, MD as Consulting Physician (Radiation Oncology) [name], Telford Nab., MD as Consulting Physician (Gastroenterology) Garner Nash, DO as Consulting Physician (Pulmonary Disease) Michael Boston, MD as Consulting Physician (General Surgery)  Date of Service:  03/30/2022  CHIEF COMPLAINT: f/u of rectal cancer  CURRENT THERAPY:  Xeloda, q21d, starting 02/16/22             -dose: 1531m BID days 1-14   ASSESSMENT & PLAN:  Jillian Lutz a 71y.o. female with   1.  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 Colonoscopy 12/01/20 by Dr. NSilverio Decamp path from ascending colon polyp and rectal mass both showed adenocarcinoma.  -Staging CT CAP on 12/06/20 showed: perirectal lymph node involvement; single nonspecific left pulmonary nodule.  -Despite strong recommendation, patient firmly and repeatedly declined IV chemo and surgery, due to the fear of being sick and not able to take time off from work. She agreed to chemoRT with Xeloda, 01/04/21 - 02/14/21. She tolerated well. -she previously declined surveillance measures, however, she developed pencil-thin stool and decreased caliber of stool. Her CEA normalized after chemoRT but increased to 18.49 on 09/28/21. She agreed to work up, and CT AP on 10/07/21 showed enlargement of LLL nodule.  -Further work up with PET  11/24/21, showed: hypermetabolic LLL nodule; marked hypermetabolism  corresponding to residual rectal wall thickening; no other evidence of hypermetabolic metastasis. I previously called in XAmoritafor her on 11/25/21, but we agreed not to start until after biopsy confirmed disease  -bronchoscopy 12/13/21 by Dr. IValeta Harms cytology negative for malignancy. Repeat biopsy was recommended, but she declined. -her most recent CEA on 12/16/21 was 31.56. -surveillance colonoscopy 01/31/22 with Dr. NSilverio Decampshowed 10 cm partially obstructing rectal mass, 5-15 cm from anal verge. Biopsy of the mass confirmed invasive moderately differentiated adenocarcinoma.  -she agreed to restart Xeloda and started on 02/16/22. She reports she has more energy, and she has gained some weight back. She also denies any further hematochezia and reports improvement in her BM. She is currently off, will restart Monday, 9/18. -I again encouraged her to get a restaging scan since last one was 4 months ago. She finally agreed.    2. Social support -[place]. Her children remain in [place] spanning ages 382-50 Our social worker SManuela Schwartzcomposed a letter for her to help her children get visas to visit her. -[name] independent with ADLs but does not have a driver's license. 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 She has been approved for free drug replacement through [facility], [place] -her husband has Alzheimer's, and she is his caregiver.  -She has LResearch officer, trade unionand COptometrist    3.  HTN, DM, pre-existing neuropathy -she is taking lisinopril, gabapentin, amlodipine, and metformin.     PLAN -continue Xeloda, will start next cycle 9/18. Her congressional nurse CHoyle Sauerwill call her pharmacy today  -CHoyle Sauer  will call radiology to schedule her CT scan in next few weeks -lab and f/u in 3 and 6 weeks    No problem-specific Assessment & Plan notes found for this encounter.   SUMMARY  OF ONCOLOGIC HISTORY: Oncology History Overview Note  Cancer Staging Rectal adenocarcinoma [facility]) Staging form: Colon and Rectum, AJCC 8th Edition - Clinical stage from 12/21/2020: Stage IIIB (cT3, cN1, cM0) - Unsigned    Rectal adenocarcinoma ([place])  08/26/2020 Miscellaneous   Initial presentation to PCP, reporting intermittent BRBPR since 02/2020    12/01/2020 Procedure   Colonoscopy by Dr. Silverio Decamp findings - The perianal and digital rectal examinations were normal. - A 15 mm polyp was found in the ascending colon. The polyp was semi-pedunculated. Resection and retrieval were complete. - A 25 mm polyp was found in the sigmoid colon. The polyp was pedunculated. Resection and retrieval were complete. - An infiltrative partially obstructing large mass was found in the proximal rectum. The mass was partially circumferential (involving one-half of the lumen circumference). The mass measured eight cm in length extending from 10 to 18cm from anal verge. This was biopsied with a cold forceps for histology. Proximal and distal opposite fold area of the mass lesion was tattooed with an injection of total 3 mL of Spot (carbon black).   12/01/2020 Initial Biopsy   Diagnosis 1. Colon, polyp(s), ascending x 1 - ADENOCARCINOMA ARISING IN A TUBULAR ADENOMA WITH HIGH-GRADE DYSPLASIA. SEE NOTE 2. Colon, sigmoid polyp, x1 - TUBULOVILLOUS ADENOMA(S) - NEGATIVE FOR HIGH-GRADE DYSPLASIA OR MALIGNANCY 3. Rectum, biopsy - ADENOCARCINOMA. SEE NOTE   12/01/2020 Cancer Staging   Cancer Staging Rectal adenocarcinoma [facility]) Staging form: Colon and Rectum, AJCC 8th Edition - Clinical stage from 12/21/2020: Stage IIIB (cT3, cN1, cM0) - Unsigned    12/06/2020 Imaging   CT CAP with contrast IMPRESSION: 1. There is partially circumferential soft tissue thickening of the mid to superior rectum, approximately 5 cm in length and the inferior extent approximately 6 cm above the anal verge, consistent with primary rectal  malignancy identified by colonoscopy. 2. There appear to be abnormally enlarged perirectal lymph nodes posteriorly about the superior rectum measuring up to 1.0 x 0.8 cm. Findings are suspicious for perirectal nodal metastatic disease, however rectal MRI is the test of choice for initial local staging of rectal cancer. 3. There is a 4 mm nonspecific pulmonary nodule of the superior segment left lower lobe, statistically most likely incidental, infectious or inflammatory, although nonspecific and isolated metastatic disease is not strictly excluded. Attention on follow-up. 4. No other evidence of metastatic disease in the chest, abdomen, or pelvis. Aortic Atherosclerosis (ICD10-I70.0).   12/09/2020 Imaging   Local staging MRI pelvis without contrast IMPRESSION: 4.9 cm circumferential mid/lower rectal mass, corresponding to the patient's newly diagnosed rectal cancer. Rectal adenocarcinoma T stage: T3c Rectal adenocarcinoma N stage:  N1 Distance from tumor to the internal anal sphincter is 3.7 cm.   12/21/2020 Initial Diagnosis   Rectal adenocarcinoma ([place])   01/04/2021 -  Chemotherapy   Concurrent chemoradiation with Xeloda 1050m in the AM and 1503min the PM on days of Radiation starting 01/04/21.       01/04/2021 - 02/11/2021 Radiation Therapy   Concurrent chemoradiation with Dr MoLisbeth Renshawnd Xeloda starting 01/04/21.    01/31/2022 Procedure   Colonoscopy, Dr. NaSilverio DecampFindings: - An infiltrative partially obstructing large mass was found in the rectum. The mass was circumferential. The mass measured ten cm in length, extending from 5-15cm from anal verge. In  addition, rectal diameter at narrowest approx twelve mm. Oozing was present. Biopsies were taken with a cold forceps for histology.  Impression: - Hemorrhoids found on perianal exam. - Likely malignant partially obstructing tumor in the rectum. Biopsied. - Non-bleeding external and internal hemorrhoids.   01/31/2022 Pathology  Results   Diagnosis Rectum, biopsy INVASIVE MODERATELY DIFFERENTIATED ADENOCARCINOMA      INTERVAL HISTORY:  Jillian Lutz is here for a follow up of rectal cancer. She was last seen by me on 03/01/22. She presents to the clinic accompanied by congressional nurse [name] and an interpreter. She reports she is doing well on Xeloda. She has gained back more weight and reports her BM have improved. She denies any further hematochezia.   All other systems were reviewed with the patient and are negative.  MEDICAL HISTORY:  Past Medical History:  Diagnosis Date   Bilateral wrist pain 10/25/2017   Hypertension    Neck mass 1998   Unknown biopsy results. Excised in [place].   Pre-diabetes    Rectal cancer ([place])    Trigger finger, acquired 02/08/2017    SURGICAL HISTORY: Past Surgical History:  Procedure Laterality Date   BRONCHIAL BIOPSY  12/13/2021   Procedure: BRONCHIAL BIOPSIES;  Surgeon: Garner Nash, DO;  Location: [place] [facility];  Service: Pulmonary;;   BRONCHIAL NEEDLE ASPIRATION BIOPSY  12/13/2021   Procedure: BRONCHIAL NEEDLE ASPIRATION BIOPSIES;  Surgeon: Garner Nash, DO;  Location: [place] [facility];  Service: Pulmonary;;   NECK SURGERY Left 1998   Excision of mass in [place]   VIDEO BRONCHOSCOPY WITH RADIAL ENDOBRONCHIAL ULTRASOUND  12/13/2021   Procedure: RADIAL ENDOBRONCHIAL ULTRASOUND;  Surgeon: Garner Nash, DO;  Location: [place] [facility];  Service: Pulmonary;;    I have reviewed the social history and family history with the patient and they are unchanged from previous note.  ALLERGIES:  is allergic to no known allergies.  MEDICATIONS:  Current Outpatient Medications  Medication Sig Dispense Refill   amLODipine (NORVASC) 5 MG tablet Take 1 tablet (5 mg total) by mouth daily. 90 tablet 3   capecitabine (XELODA) 500 MG tablet Take 3 tablets (1,500 mg total) by mouth 2 (two) times daily after a meal. Take for 14 days on, 7 days off. Repeat every 21 days. 84 tablet 1    gabapentin (NEURONTIN) 100 MG capsule Take 1 capsule (100 mg total) by mouth 3 (three) times daily as needed. 90 capsule 2   lisinopril-hydrochlorothiazide (ZESTORETIC) 20-12.5 MG tablet Take 2 tablets by mouth daily. 60 tablet 3   Multiple Vitamins-Minerals (MULTIVITAMIN WITH MINERALS) tablet Take 1 tablet by mouth daily. Gummy 50 mg     prochlorperazine (COMPAZINE) 10 MG tablet Take 1 tablet (10 mg total) by mouth every 6 (six) hours as needed. 30 tablet 2   No current facility-administered medications for this visit.    PHYSICAL EXAMINATION: ECOG PERFORMANCE STATUS: 1 - Symptomatic but completely ambulatory  Vitals:   03/30/22 0935  BP: (!) 145/84  Pulse: 61  Resp: 16  Temp: 98.3 F (36.8 C)  SpO2: 100%   Wt Readings from Last 3 Encounters:  03/30/22 123 lb 6.4 oz (56 kg)  03/01/22 118 lb 1.6 oz (53.6 kg)  02/13/22 116 lb 1.6 oz (52.7 kg)     GENERAL:alert, no distress and comfortable SKIN: skin color normal, no rashes or significant lesions EYES: normal, Conjunctiva are pink and non-injected, sclera clear  NEURO: alert & oriented x 3 with fluent speech  LABORATORY DATA:  I have reviewed the data  as listed    Latest Ref Rng & Units 03/30/2022    9:01 AM 03/01/2022    1:04 PM 02/13/2022    8:39 AM  CBC  WBC 4.0 - 10.5 K/uL 6.7  5.8  7.6   Hemoglobin 12.0 - 15.0 g/dL 11.7  11.6  11.0   Hematocrit 36.0 - 46.0 % 36.0  34.5  33.7   Platelets 150 - 400 K/uL 229  297  261         Latest Ref Rng & Units 03/30/2022    9:01 AM 03/01/2022    1:04 PM 02/13/2022    8:39 AM  CMP  Glucose 70 - 99 mg/dL 109  126  142   BUN 8 - 23 mg/dL _0 Creatinine 0.44 - 1.00 mg/dL 0.62  0.75  0.72   Sodium 135 - 145 mmol/L 140  141  138   Potassium 3.5 - 5.1 mmol/L 4.0  3.6  3.9   Chloride 98 - 111 mmol/L 110  107  103   CO2 22 - 32 mmol/L _1 Calcium 8.9 - 10.3 mg/dL 9.0  9.1  8.4   Total Protein 6.5 - 8.1 g/dL 6.4  7.0  6.7   Total Bilirubin 0.3 - 1.2 mg/dL 0.3   0.3  0.5   Alkaline Phos 38 - 126 U/L 51  51  54   AST 15 - 41 U/L _2 ALT 0 - 44 U/L _3 RADIOGRAPHIC STUDIES: I have personally reviewed the radiological images as listed and agreed with the findings in the report. No results found.    Orders Placed This Encounter  Procedures   CEA (Access)-[facility] ONLY    Standing Status:   Standing    Number of Occurrences:   20    Standing Expiration Date:   03/31/2023   All questions were answered. The patient knows to call the clinic with any problems, questions or concerns. No barriers to learning was detected. The total time spent in the appointment was 30 minutes.     [name], MD 03/30/2022   I, Wilburn Mylar, am acting as scribe for [name], MD.   I have reviewed the above documentation for accuracy and completeness, and I agree with the above.

## 2022-04-11 ENCOUNTER — Other Ambulatory Visit: Payer: Self-pay

## 2022-04-11 DIAGNOSIS — C20 Malignant neoplasm of rectum: Secondary | ICD-10-CM

## 2022-04-11 MED ORDER — CAPECITABINE 500 MG PO TABS: 1000.0000 mg/m2 | ORAL_TABLET | Freq: Two times a day (BID) | ORAL | 1 refills | Status: DC

## 2022-04-12 NOTE — Congregational Nurse Program (Signed)
Phone call to [facility]. Requested Xeloda refill for patient to be delivered on 04/20/2022 by [employer].  Order # [id number]. Jake Michaelis RN, Congregational Nurse [phone].Jake Michaelis RN, Congregational Nurse [phone]

## 2022-04-12 NOTE — Congregational Nurse Program (Signed)
Home visit with interpreter Jillian Lutz to review instructions for C-T scan scheduled for 04/17/2022.  Informed patient that next delivery of Xeloda would be on 04/20/2022.  When asked if she is taking current round of Xeloda she stated she is out of medication.  Upon questioning it was discovered that she never received the scheduled 03/31/2022 delivery.  Phone call to [facility] who stated medication was shipped but not delivered (reason unknown).  However it is possibly still at the [place] located at [facility] on [address] [place].  Phone call to [employer] and package is available for pickup. CN took medication to patient with instructions not to take it until Dr. Burr Medico is notified.  Left message for Dr. Ernestina Penna nurse.  Jake Michaelis RN, Congregational Nurse [phone]

## 2022-04-13 ENCOUNTER — Telehealth: Payer: Self-pay

## 2022-04-13 NOTE — Telephone Encounter (Signed)
Phone call to patient with interpreter Paulina Fusi on conference call.  Informed patient that Dr. Burr Medico wants her to resume Xeloda today or tomorrow 3 tablets B.I.D. for 14 days and then off for 7 days. Patient stated she would start it this evening. We also reminded her of C-T scan on Monday 04/17/2022 and went over protocol again. Advised her that her ride should arrive @ 8:30 am on Monday. She expressed understanding and will call if she has questions later. Discussed follow-up appointment with Dr. Burr Medico on 04/20/2022.  Jake Michaelis RN, Congregational Nurse [phone]

## 2022-04-17 ENCOUNTER — Encounter (HOSPITAL_COMMUNITY): Payer: Self-pay

## 2022-04-17 ENCOUNTER — Ambulatory Visit (HOSPITAL_COMMUNITY)
Admission: RE | Admit: 2022-04-17 | Discharge: 2022-04-17 | Disposition: A | Discharge status: Home / Self Care | Payer: Medicaid Other | Source: Ambulatory Visit | Attending: Hematology | Admitting: Hematology

## 2022-04-17 DIAGNOSIS — R911 Solitary pulmonary nodule: Secondary | ICD-10-CM | POA: Diagnosis not present

## 2022-04-17 DIAGNOSIS — C20 Malignant neoplasm of rectum: Secondary | ICD-10-CM | POA: Insufficient documentation

## 2022-04-17 DIAGNOSIS — K6389 Other specified diseases of intestine: Secondary | ICD-10-CM | POA: Diagnosis not present

## 2022-04-17 MED ORDER — SODIUM CHLORIDE (PF) 0.9 % IJ SOLN
INTRAMUSCULAR | Status: AC
  Filled 2022-04-17: qty 50

## 2022-04-17 MED ORDER — IOHEXOL 300 MG/ML  SOLN
100.0000 mL | Freq: Once | INTRAMUSCULAR | Status: AC | PRN
  Administered 2022-04-17: 100 mL via INTRAVENOUS

## 2022-04-20 ENCOUNTER — Inpatient Hospital Stay: Payer: Medicaid Other | Attending: Physician Assistant

## 2022-04-20 ENCOUNTER — Inpatient Hospital Stay (HOSPITAL_BASED_OUTPATIENT_CLINIC_OR_DEPARTMENT_OTHER): Payer: Medicaid Other | Admitting: Hematology

## 2022-04-20 ENCOUNTER — Other Ambulatory Visit: Payer: Self-pay

## 2022-04-20 ENCOUNTER — Encounter: Payer: Self-pay | Admitting: Hematology

## 2022-04-20 VITALS — BP 118/82 | HR 80 | Temp 98.4°F | Resp 16 | Ht 59.0 in | Wt 123.8 lb

## 2022-04-20 DIAGNOSIS — C20 Malignant neoplasm of rectum: Secondary | ICD-10-CM | POA: Diagnosis not present

## 2022-04-20 DIAGNOSIS — Z5111 Encounter for antineoplastic chemotherapy: Secondary | ICD-10-CM | POA: Diagnosis present

## 2022-04-20 DIAGNOSIS — K5900 Constipation, unspecified: Secondary | ICD-10-CM | POA: Insufficient documentation

## 2022-04-20 DIAGNOSIS — I10 Essential (primary) hypertension: Secondary | ICD-10-CM | POA: Insufficient documentation

## 2022-04-20 LAB — CMP (CANCER CENTER ONLY)
ALT: 6 U/L (ref 0–44)
AST: 14 U/L — ABNORMAL LOW (ref 15–41)
Albumin: 3.7 g/dL (ref 3.5–5.0)
Alkaline Phosphatase: 54 U/L (ref 38–126)
Anion gap: 4 — ABNORMAL LOW (ref 5–15)
BUN: 16 mg/dL (ref 8–23)
CO2: 32 mmol/L (ref 22–32)
Calcium: 8.9 mg/dL (ref 8.9–10.3)
Chloride: 103 mmol/L (ref 98–111)
Creatinine: 0.82 mg/dL (ref 0.44–1.00)
GFR, Estimated: >60 mL/min (ref >60)
Glucose, Bld: 140 mg/dL — ABNORMAL HIGH (ref 70–99)
Potassium: 3.9 mmol/L (ref 3.5–5.1)
Sodium: 139 mmol/L (ref 135–145)
Total Bilirubin: 0.3 mg/dL (ref 0.3–1.2)
Total Protein: 7 g/dL (ref 6.5–8.1)

## 2022-04-20 LAB — CBC WITH DIFFERENTIAL (CANCER CENTER ONLY)
Abs Immature Granulocytes: 0.02 10*3/uL (ref 0.00–0.07)
Basophils Absolute: 0.1 10*3/uL (ref 0.0–0.1)
Basophils Relative: 1 %
Eosinophils Absolute: 0.1 10*3/uL (ref 0.0–0.5)
Eosinophils Relative: 2 %
HCT: 36.3 % (ref 36.0–46.0)
Hemoglobin: 12 g/dL (ref 12.0–15.0)
Immature Granulocytes: 0 %
Lymphocytes Relative: 11 %
Lymphs Abs: 0.9 10*3/uL (ref 0.7–4.0)
MCH: 26.4 pg (ref 26.0–34.0)
MCHC: 33.1 g/dL (ref 30.0–36.0)
MCV: 80 fL (ref 80.0–100.0)
Monocytes Absolute: 0.6 10*3/uL (ref 0.1–1.0)
Monocytes Relative: 8 %
Neutro Abs: 5.9 10*3/uL (ref 1.7–7.7)
Neutrophils Relative %: 78 %
Platelet Count: 246 10*3/uL (ref 150–400)
RBC: 4.54 MIL/uL (ref 3.87–5.11)
RDW: 17.1 % — ABNORMAL HIGH (ref 11.5–15.5)
WBC Count: 7.5 10*3/uL (ref 4.0–10.5)
nRBC: 0 % (ref 0.0–0.2)

## 2022-04-20 LAB — IRON AND IRON BINDING CAPACITY (CC-WL,HP ONLY)
Iron: 33 ug/dL (ref 28–170)
Saturation Ratios: 9 % — ABNORMAL LOW (ref 10.4–31.8)
TIBC: 358 ug/dL (ref 250–450)
UIBC: 325 ug/dL (ref 148–442)

## 2022-04-20 LAB — FERRITIN: Ferritin: 47 ng/mL (ref 11–307)

## 2022-04-20 NOTE — Congregational Nurse Program (Signed)
Home visit to deliver Miralax to patient as recommended by Dr. Burr Medico this morning.  Reminded her that Dr. Burr Medico said to take a double dose since patient did not feel it was effective in past.  She will take it twice a day instead of once.  We also went over her appointment schedule for chemo education class and treatment.  CN will assist with transportation.  Jake Michaelis RN, Congregational Nurse [phone]

## 2022-04-20 NOTE — Progress Notes (Signed)
START ON PATHWAY REGIMEN - Colorectal     A cycle is every 21 days:     Capecitabine      Oxaliplatin   **Always confirm dose/schedule in your pharmacy ordering system**  Patient Characteristics: Distant Metastases, Resectable, Neoadjuvant Therapy Planned Tumor Location: Colon Therapeutic Status: Distant Metastases  Intent of Therapy: Non-Curative / Palliative Intent, Discussed with Patient

## 2022-04-20 NOTE — Progress Notes (Signed)
Jillian Lutz   [phone] Fax:([phone]) [phone]   Clinic Follow up Note   Patient Care Team: Gaylan Gerold, DO as PCP - General (Internal Medicine) Jonnie Finner, RN (Inactive) as Oncology Nurse Navigator Alla Feeling, NP as Nurse Practitioner (Nurse Practitioner) [name], MD as Consulting Physician (Hematology and Oncology) Kyung Rudd, MD as Consulting Physician (Radiation Oncology) [name], Telford Nab., MD as Consulting Physician (Gastroenterology) Garner Nash, DO as Consulting Physician (Pulmonary Disease) Michael Boston, MD as Consulting Physician (General Surgery)  Date of Service:  04/20/2022  CHIEF COMPLAINT: f/u of rectal cancer  CURRENT THERAPY:  Xeloda, q21d, starting 02/16/22             -dose: 1549m BID days 1-14   ASSESSMENT & PLAN:  Jillian Spargois a 71y.o. female with   1.  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 Colonoscopy 12/01/20 by Dr. NSilverio Decamp path from ascending colon polyp and rectal mass both showed adenocarcinoma.  -Staging CT CAP on 12/06/20 showed: perirectal lymph node involvement; single nonspecific left pulmonary nodule.  -Despite strong recommendation, patient firmly and repeatedly declined IV chemo and surgery. She agreed to chemoRT with Xeloda, 01/04/21 - 02/14/21. She tolerated well. -she previously declined surveillance measures, however, she developed pencil-thin and decreased caliber of stool. Her CEA normalized after chemoRT but increased to 18.49 on 09/28/21. She agreed to work up, and CT AP on 10/07/21 showed enlargement of LLL nodule.  -Further work up with PET 11/24/21, showed: hypermetabolic LLL nodule; marked hypermetabolism corresponding to residual rectal wall thickening; no other evidence of  hypermetabolic metastasis.  -bronchoscopy 12/13/21 by Dr. IValeta Harms cytology negative for malignancy. Repeat biopsy was recommended, but she declined. -her most recent CEA on 12/16/21 was 31.56. -surveillance colonoscopy 01/31/22 with Dr. NSilverio Decampshowed 10 cm partially obstructing rectal mass, 5-15 cm from anal verge. Biopsy of the mass confirmed invasive moderately differentiated adenocarcinoma.  -she agreed to restart Xeloda and started on 02/16/22. She reports she has more energy, and she has gained some weight back. She also denies any further hematochezia and reports improvement in her BM.  -restaging CT CAP on 04/17/22 showed: persistent rectal thickening extending 9.7 cm proximal to anorectal junction; no definite lymphadenopathy; slight increased size of superior LLL pulmonary nodule, currently 1.5 cm; no new pulmonary nodules or other metastatic disease.  Previous scan with IMA, and did not start Xeloda on 2 August, or progression of her cancer is likely the natural course, less indicating failure of Xeloda.  -I reviewed the results with them today. We again discussed treatment, and I asked about IV chemo and surgery again today.  I think as well as along is probably not adequate for her disease control.  She notes she is not currently working, but she still does not want surgery. I explained I would add oxaliplatin, which is a 3 hr infusion given every 3 weeks. We discussed side effects, including fatigue, taste change, and cold sensitivity, which will likely last around 3-5 days. I also briefly discussed the option of a port, if she has difficulty with her vein. After consideration, she agrees to proceed. I recommend starting at low dose, and we would also decrease her Xeloda dose by 5085mdaily. I also reassured her that we can stop if she doesn't tolerate well. -lab reviewed, her  iron is low but WNL, hgb is also WNL.   2. Constipation -secondary to #1 -we reviewed management. She notes miralax was not  helpful. I encouraged her to try higher dose.  3. Social support -She and her husband moved from [place] in 2014. Her children remain in [place], spanning ages 88-50. Our social worker [name] previously composed a letter for her to help her children get visas to visit her. -Jillian Lutz is independent with ADLs but does not have a driver's license. She tells me today (04/20/22) she is not currently working. -She has orange card. She has been approved for free drug replacement through [facility], documents in [place]. -her husband has Alzheimer's, and she is his caregiver.  -She has Research officer, trade union and Optometrist.      PLAN -continue Xeloda, at same dose for this cycle  -she agree to add oxaliplatin at next cycle, due to her high concern of side effects, will do low-dose oxaliplatin 85 mg/m for first cycle, and increase subsequently if she tolerates well. -lab, f/u, and oxaliplatin in 2 and 5 weeks -We will slightly decrease Xeloda dose to 2 tablets in the morning and 3 tablets in the evening from next cycle   No problem-specific Assessment & Plan notes found for this encounter.   SUMMARY OF ONCOLOGIC HISTORY: Oncology History Overview Note  Cancer Staging Rectal adenocarcinoma [facility]) Staging form: Colon and Rectum, AJCC 8th Edition - Clinical stage from 12/21/2020: Stage IIIB (cT3, cN1, cM0) - Unsigned    Rectal adenocarcinoma ([place])  08/26/2020 Miscellaneous   Initial presentation to PCP, reporting intermittent BRBPR since 02/2020    12/01/2020 Procedure   Colonoscopy by Dr. Silverio Decamp findings - The perianal and digital rectal examinations were normal. - A 15 mm polyp was found in the ascending colon. The polyp was semi-pedunculated. Resection and retrieval were complete. - A 25 mm polyp was found in the sigmoid colon. The polyp was pedunculated. Resection and retrieval were complete. - An infiltrative partially obstructing large mass was found in the proximal rectum. The mass was  partially circumferential (involving one-half of the lumen circumference). The mass measured eight cm in length extending from 10 to 18cm from anal verge. This was biopsied with a cold forceps for histology. Proximal and distal opposite fold area of the mass lesion was tattooed with an injection of total 3 mL of Spot (carbon black).   12/01/2020 Initial Biopsy   Diagnosis 1. Colon, polyp(s), ascending x 1 - ADENOCARCINOMA ARISING IN A TUBULAR ADENOMA WITH HIGH-GRADE DYSPLASIA. SEE NOTE 2. Colon, sigmoid polyp, x1 - TUBULOVILLOUS ADENOMA(S) - NEGATIVE FOR HIGH-GRADE DYSPLASIA OR MALIGNANCY 3. Rectum, biopsy - ADENOCARCINOMA. SEE NOTE   12/01/2020 Cancer Staging   Cancer Staging Rectal adenocarcinoma [facility]) Staging form: Colon and Rectum, AJCC 8th Edition - Clinical stage from 12/21/2020: Stage IIIB (cT3, cN1, cM0) - Unsigned    12/06/2020 Imaging   CT CAP with contrast IMPRESSION: 1. There is partially circumferential soft tissue thickening of the mid to superior rectum, approximately 5 cm in length and the inferior extent approximately 6 cm above the anal verge, consistent with primary rectal malignancy identified by colonoscopy. 2. There appear to be abnormally enlarged perirectal lymph nodes posteriorly about the superior rectum measuring up to 1.0 x 0.8 cm. Findings are suspicious for perirectal nodal metastatic disease, however rectal MRI is the test of choice for initial local staging of rectal cancer. 3. There is a 4 mm nonspecific pulmonary nodule of the superior segment left lower lobe,  statistically most likely incidental, infectious or inflammatory, although nonspecific and isolated metastatic disease is not strictly excluded. Attention on follow-up. 4. No other evidence of metastatic disease in the chest, abdomen, or pelvis. Aortic Atherosclerosis (ICD10-I70.0).   12/09/2020 Imaging   Local staging MRI pelvis without contrast IMPRESSION: 4.9 cm circumferential mid/lower  rectal mass, corresponding to the patient's newly diagnosed rectal cancer. Rectal adenocarcinoma T stage: T3c Rectal adenocarcinoma N stage:  N1 Distance from tumor to the internal anal sphincter is 3.7 cm.   12/21/2020 Initial Diagnosis   Rectal adenocarcinoma ([place])   01/04/2021 -  Chemotherapy   Concurrent chemoradiation with Xeloda 1082m in the AM and 15047min the PM on days of Radiation starting 01/04/21.       01/04/2021 - 02/11/2021 Radiation Therapy   Concurrent chemoradiation with Dr MoLisbeth Renshawnd Xeloda starting 01/04/21.    01/31/2022 Procedure   Colonoscopy, Dr. NaSilverio DecampFindings: - An infiltrative partially obstructing large mass was found in the rectum. The mass was circumferential. The mass measured ten cm in length, extending from 5-15cm from anal verge. In addition, rectal diameter at narrowest approx twelve mm. Oozing was present. Biopsies were taken with a cold forceps for histology.  Impression: - Hemorrhoids found on perianal exam. - Likely malignant partially obstructing tumor in the rectum. Biopsied. - Non-bleeding external and internal hemorrhoids.   01/31/2022 Pathology Results   Diagnosis Rectum, biopsy INVASIVE MODERATELY DIFFERENTIATED ADENOCARCINOMA   05/04/2022 -  Chemotherapy   Patient is on Treatment Plan : COLORECTAL Xelox ([place]ox)(130/850) q21d        INTERVAL HISTORY:  Jillian Lutz here for a follow up of rectal cancer. She was last seen by me on 03/30/22. She presents to the clinic accompanied by an interpreter and coOptometristShe reports she is doing well overall. She does report constipation and pain with numbness/tingling to her left leg. She rated the leg pain at 5/10 and endorses taking tylenol often. She denies any low back or tailbone pain. She notes she is eating well and denies any pain outside of the constipation or fatigue.   All other systems were reviewed with the patient and are negative.  MEDICAL HISTORY:  Past  Medical History:  Diagnosis Date   Bilateral wrist pain 10/25/2017   Hypertension    Neck mass 1998   [place] biopsy results. [place]  Pre-diabetes    [place]   Trigger finger, acquired 02/08/2017    SURGICAL HISTORY: Past Surgical History:  Procedure Laterality Date   BRONCHIAL BIOPSY  12/13/2021   Procedure: BRONCHIAL BIOPSIES;  Surgeon: IcGarner NashDO;  [facility];  Service: Pulmonary;;   BRONCHIAL NEEDLE ASPIRATION BIOPSY  12/13/2021   Procedure: BRONCHIAL NEEDLE ASPIRATION BIOPSIES;  Surgeon: IcGarner NashDO;  [facility];  Service: Pulmonary;;   NECK SURGERY Left 1998   Excision of [place] in [place] BRONCHOS[place] WITH RADIAL ENDOBRONCHIAL ULTRASOUND  12/13/2021   Procedure: RADIAL ENDOBRONCHIAL ULTRASOUND;  Surgeon: IcGarner NashDO;  [facility];  Service: Pulmonary;;    I have reviewed the social history and family history with the patient and they are unchanged from previous note.  ALLERGIES:  is allergic to no known allergies.  MEDICATIONS:  Current Outpatient Medications  Medication Sig Dispense Refill   amLODipine (NORVASC) 5 MG tablet Take 1 tablet (5 mg total) by mouth daily. 90 tablet 3   capecitabine (XELODA) 500 MG tablet Take 3 tablets (  1,500 mg total) by mouth 2 (two) times daily after a meal. Take for 14 days on, 7 days off. Repeat every 21 days. 84 tablet 1   gabapentin (NEURONTIN) 100 MG capsule Take 1 capsule (100 mg total) by mouth 3 (three) times daily as needed. 90 capsule 2   lisinopril-hydrochlorothiazide (ZESTORETIC) 20-12.5 MG tablet Take 2 tablets by mouth daily. 60 tablet 3   Multiple Vitamins-Minerals (MULTIVITAMIN WITH MINERALS) tablet Take 1 tablet by mouth daily. Gummy 50 mg     prochlorperazine (COMPAZINE) 10 MG tablet Take 1 tablet (10 mg total) by mouth every 6 (six) hours as needed. 30 tablet 2   No current facility-administered medications for this visit.     PHYSICAL EXAMINATION: ECOG PERFORMANCE STATUS: 1 - Symptomatic but completely ambulatory  Vitals:   04/20/22 1025  BP: 118/82  Pulse: 80  Resp: 16  Temp: 98.4 F (36.9 C)  SpO2: 99%   Wt Readings from Last 3 Encounters:  04/20/22 123 lb 12.8 oz (56.2 kg)  03/30/22 123 lb 6.4 oz (56 kg)  03/01/22 118 lb 1.6 oz (53.6 kg)     GENERAL:alert, no distress and comfortable SKIN: skin color normal, no rashes or significant lesions EYES: normal, Conjunctiva are pink and non-injected, sclera clear  NEURO: alert & oriented x 3 with fluent speech  LABORATORY DATA:  I have reviewed the data as listed    Latest Ref Rng & Units 04/20/2022    9:57 AM 03/30/2022    9:01 AM 03/01/2022    1:04 PM  CBC  WBC 4.0 - 10.5 K/uL 7.5  6.7  5.8   Hemoglobin 12.0 - 15.0 g/dL 12.0  11.7  11.6   Hematocrit 36.0 - 46.0 % 36.3  36.0  34.5   Platelets 150 - 400 K/uL 246  229  297         Latest Ref Rng & Units 04/20/2022    9:57 AM 03/30/2022    9:01 AM 03/01/2022    1:04 PM  CMP  Glucose 70 - 99 mg/dL 140  109  126   BUN 8 - 23 mg/dL '16  20  14   ' Creatinine 0.44 - 1.00 mg/dL 0.82  0.62  0.75   Sodium 135 - 145 mmol/L 139  140  141   Potassium 3.5 - 5.1 mmol/L 3.9  4.0  3.6   Chloride 98 - 111 mmol/L 103  110  107   CO2 22 - 32 mmol/L 32  26  27   Calcium 8.9 - 10.3 mg/dL 8.9  9.0  9.1   Total Protein 6.5 - 8.1 g/dL 7.0  6.4  7.0   Total Bilirubin 0.3 - 1.2 mg/dL 0.3  0.3  0.3   Alkaline Phos 38 - 126 U/L 54  51  51   AST 15 - 41 U/L '14  14  14   ' ALT 0 - 44 U/L '6  8  6       ' RADIOGRAPHIC STUDIES: I have personally reviewed the radiological images as listed and agreed with the findings in the report. No results found.    Orders Placed This Encounter  Procedures   CBC with Differential ([place] Only)    Standing Status:   Future    Standing Expiration Date:   05/05/2023   CMP ([place] only)    Standing Status:   Future    Standing Expiration Date:   05/05/2023   CBC  with Differential ([place] Only)  Standing Status:   Future    Standing Expiration Date:   05/26/2023   CMP ([place] only)    Standing Status:   Future    Standing Expiration Date:   05/26/2023   All questions were answered. The patient knows to call the clinic with any problems, questions or concerns. No barriers to learning was detected. The total time spent in the appointment was 45 minutes.     [name], MD 04/20/2022   I, Wilburn Mylar, am acting as scribe for [name], MD.   I have reviewed the above documentation for accuracy and completeness, and I agree with the above.

## 2022-04-21 ENCOUNTER — Other Ambulatory Visit: Payer: Self-pay

## 2022-04-22 ENCOUNTER — Other Ambulatory Visit: Payer: Self-pay | Admitting: Hematology

## 2022-04-22 ENCOUNTER — Other Ambulatory Visit: Payer: Self-pay

## 2022-04-23 ENCOUNTER — Other Ambulatory Visit: Payer: Self-pay

## 2022-04-24 ENCOUNTER — Telehealth: Payer: Self-pay

## 2022-04-24 NOTE — Telephone Encounter (Signed)
Phone call to patient.  States Miralax is helping soften BM's.  Reminded her of 04/27/2022 appointment @ 11:00 am for patient education regarding chemo and 10/18 appointment at 8:30  am for labs, provider appointment and for first chemo infusion at 10:00 am.  Told her transportation has been requested thru [facility] and gave her pick up time for each appointment.  Also told her that one she completes appointments she needs to go to check-in desk and ask them to contact transportation for her ride home.  Patient voiced understanding.  Jake Michaelis RN, congregational Nurse [phone]

## 2022-04-26 NOTE — Progress Notes (Signed)
Pharmacist Chemotherapy Monitoring - Initial Assessment    Anticipated start date: 05/03/22   The following has been reviewed per standard work regarding the patient's treatment regimen: The patient's diagnosis, treatment plan and drug doses, and organ/hematologic function Lab orders and baseline tests specific to treatment regimen  The treatment plan start date, drug sequencing, and pre-medications Prior authorization status  Patient's documented medication list, including drug-drug interaction screen and prescriptions for anti-emetics and supportive care specific to the treatment regimen The drug concentrations, fluid compatibility, administration routes, and timing of the medications to be used The patient's access for treatment and lifetime cumulative dose history, if applicable  The patient's medication allergies and previous infusion related reactions, if applicable   Changes made to treatment plan:  N/A  Follow up needed:  N/A   Larene Beach, RPH, 04/26/2022  4:24 PM

## 2022-04-27 ENCOUNTER — Other Ambulatory Visit: Payer: Self-pay

## 2022-04-27 ENCOUNTER — Inpatient Hospital Stay: Payer: Medicaid Other

## 2022-05-02 MED FILL — Dexamethasone Sodium Phosphate Inj 100 MG/10ML: INTRAMUSCULAR | Qty: 1 | Status: AC

## 2022-05-02 NOTE — Progress Notes (Unsigned)
[place] OFFICE PROGRESS NOTE  Gaylan Gerold, DO [address] [place] [address]  DIAGNOSIS: f/u of rectal cancer  Oncology History Overview Note  Cancer Staging Rectal adenocarcinoma [facility]) Staging form: Colon and Rectum, AJCC 8th Edition - Clinical stage from 12/21/2020: Stage IIIB (cT3, cN1, cM0) - Unsigned    Rectal adenocarcinoma ([place])  08/26/2020 Miscellaneous   Initial presentation to PCP, reporting intermittent BRBPR since 02/2020    12/01/2020 Procedure   Colonoscopy by Dr. Silverio Decamp findings - The perianal and digital rectal examinations were normal. - A 15 mm polyp was found in the ascending colon. The polyp was semi-pedunculated. Resection and retrieval were complete. - A 25 mm polyp was found in the sigmoid colon. The polyp was pedunculated. Resection and retrieval were complete. - An infiltrative partially obstructing large mass was found in the proximal rectum. The mass was partially circumferential (involving one-half of the lumen circumference). The mass measured eight cm in length extending from 10 to 18cm from anal verge. This was biopsied with a cold forceps for histology. Proximal and distal opposite fold area of the mass lesion was tattooed with an injection of total 3 mL of Spot (carbon black).   12/01/2020 Initial Biopsy   Diagnosis 1. Colon, polyp(s), ascending x 1 - ADENOCARCINOMA ARISING IN A TUBULAR ADENOMA WITH HIGH-GRADE DYSPLASIA. SEE NOTE 2. Colon, sigmoid polyp, x1 - TUBULOVILLOUS ADENOMA(S) - NEGATIVE FOR HIGH-GRADE DYSPLASIA OR MALIGNANCY 3. Rectum, biopsy - ADENOCARCINOMA. SEE NOTE   12/01/2020 Cancer Staging   Cancer Staging Rectal adenocarcinoma [facility]) Staging form: Colon and Rectum, AJCC 8th Edition - Clinical stage from 12/21/2020: Stage IIIB (cT3, cN1, cM0) - Unsigned    12/06/2020 Imaging   CT CAP with contrast IMPRESSION: 1. There is partially circumferential soft tissue thickening of the mid to superior rectum,  approximately 5 cm in length and the inferior extent approximately 6 cm above the anal verge, consistent with primary rectal malignancy identified by colonoscopy. 2. There appear to be abnormally enlarged perirectal lymph nodes posteriorly about the superior rectum measuring up to 1.0 x 0.8 cm. Findings are suspicious for perirectal nodal metastatic disease, however rectal MRI is the test of choice for initial local staging of rectal cancer. 3. There is a 4 mm nonspecific pulmonary nodule of the superior segment left lower lobe, statistically most likely incidental, infectious or inflammatory, although nonspecific and isolated metastatic disease is not strictly excluded. Attention on follow-up. 4. No other evidence of metastatic disease in the chest, abdomen, or pelvis. Aortic Atherosclerosis (ICD10-I70.0).   12/09/2020 Imaging   Local staging MRI pelvis without contrast IMPRESSION: 4.9 cm circumferential mid/lower rectal mass, corresponding to the patient's newly diagnosed rectal cancer. Rectal adenocarcinoma T stage: T3c Rectal adenocarcinoma N stage:  N1 Distance from tumor to the internal anal sphincter is 3.7 cm.   12/21/2020 Initial Diagnosis   Rectal adenocarcinoma ([place])   01/04/2021 -  Chemotherapy   Concurrent chemoradiation with Xeloda 1030m in the AM and 15088min the PM on days of Radiation starting 01/04/21.       01/04/2021 - 02/11/2021 Radiation Therapy   Concurrent chemoradiation with Dr MoLisbeth Renshawnd Xeloda starting 01/04/21.    01/31/2022 Procedure   Colonoscopy, Dr. NaSilverio DecampFindings: - An infiltrative partially obstructing large mass was found in the rectum. The mass was circumferential. The mass measured ten cm in length, extending from 5-15cm from anal verge. In addition, rectal diameter at narrowest approx twelve mm. Oozing was present. Biopsies were taken with  a cold forceps for histology.  Impression: - Hemorrhoids found on perianal exam. - Likely malignant  partially obstructing tumor in the rectum. Biopsied. - Non-bleeding external and internal hemorrhoids.   01/31/2022 Pathology Results   Diagnosis Rectum, biopsy INVASIVE MODERATELY DIFFERENTIATED ADENOCARCINOMA   05/10/2022 -  Chemotherapy   Patient is on Treatment Plan : COLORECTAL Xelox (Capeox)(130/850) q21d        CURRENT THERAPY: Xeloda, q21d, starting 02/16/22             -dose: 1568m BID days 1-14   INTERVAL HISTORY: Damyah [id number].o. female returns to the clinic today for a follow-up visit accompanied by her interpretor and friend.  The patient has been reluctant to start treatment for several months.  She was last seen by Dr. FBurr Medicoon 04/20/2022.  When she was last seen by Dr. FBurr Medicothe patient had disease progression, however she failed to start her Xeloda as recommended.  Therefore, the disease progression was likely secondary to the natural disease course as opposed to inefficacy of the Xeloda.  Given the amount of disease, Dr. FBurr Medicodoes not think Xeloda alone will adequately control her condition.  Therefore Dr. FBurr Medicorecommended CAPOX.  She is expected to start dose reduced oxaliplatin today IV every 3 weeks.   However, the patient was unable to tell me today any details regarding when she started her medication, how many tablets she is taking, and dates of starting and stopping the medication (as she admitted to skipping days). Despite multiple attempts to clarify, I was unable to get any clarity as she continued to tell the interpretor "she cannot remember". When asked for her to tell me what was discussed at her last appointment and chemo education regarding her dose or how to take her medications, she also states she cannot remember.   Since last being seen, the patient reports starting the Xeloda on an unknown date, stopped, and then began again about 3 days ago.  She reports she is doing overall "about the same, a little better" since the last time she was seen.  She denies any  fever, chills, or night sweats.   She repots she is breathing well.  She reports she has trouble sleeping due to insomnia. She reports constipation nearly everyday which she is able to treat with Miralax. She denies nausea, vomiting, diarrhea. She reports her bowel passage is sometimes uncomfortable, but denies they are loose, or a different size than usual. She denies passing blood in her stool. She denies abdominal pain. Denies any rashes or skin changes.  The patient is here today for evaluation repeat blood work before undergoing the neck cycle of treatment.   MEDICAL HISTORY: Past Medical History:  Diagnosis Date   Bilateral wrist pain 10/25/2017   Hypertension    Neck mass 1998   [place]   Pre-diabetes    [place]    Trigger finger, acquired 02/08/2017    ALLERGIES:  is allergic to no known allergies.  MEDICATIONS:  Current Outpatient Medications  Medication Sig Dispense Refill   amLODipine (NORVASC) 5 MG tablet Take 1 tablet (5 mg total) by mouth daily. 90 tablet 3   capecitabine (XELODA) 500 MG tablet Take 3 tablets (1,500 mg total) by mouth 2 (two) times daily after a meal. Take for 14 days on, 7 days off. Repeat every 21 days. 84 tablet 1   gabapentin (NEURONTIN) 100 MG capsule Take 1 capsule (100 mg total) by mouth 3 (  three) times daily as needed. 90 capsule 2   lisinopril-hydrochlorothiazide (ZESTORETIC) 20-12.5 MG tablet Take 2 tablets by mouth daily. 60 tablet 3   Multiple Vitamins-Minerals (MULTIVITAMIN WITH MINERALS) tablet Take 1 tablet by mouth daily. Gummy 50 mg     prochlorperazine (COMPAZINE) 10 MG tablet Take 1 tablet (10 mg total) by mouth every 6 (six) hours as needed. 30 tablet 2   No current facility-administered medications for this visit.    SURGICAL HISTORY:  Past Surgical History:  Procedure Laterality Date   BRONCHIAL BIOPSY  12/13/2021   Procedure: BRONCHIAL BIOPSIES;  Surgeon: Garner Nash, DO;   Location: [facility];  Service: Pulmonary;;   BRONCHIAL NEEDLE ASPIRATION BIOPSY  12/13/2021   Procedure: BRONCHIAL NEEDLE ASPIRATION BIOPSIES;  Surgeon: Garner Nash, DO;  Location: [facility];  Service: Pulmonary;;   NECK SURGERY Left 1998   Excision of mass in [place]   VIDEO BRONCHOSCOPY WITH RADIAL ENDOBRONCHIAL ULTRASOUND  12/13/2021   Procedure: RADIAL ENDOBRONCHIAL ULTRASOUND;  Surgeon: Garner Nash, DO;  Location: [facility];  Service: Pulmonary;;    REVIEW OF SYSTEMS:   Review of Systems  Constitutional: Negative for appetite change, chills, fatigue, fever and unexpected weight change.  HENT:   Negative for mouth sores, nosebleeds, sore throat and trouble swallowing.   Eyes: Negative for eye problems and icterus.  Respiratory: Negative for cough, hemoptysis, shortness of breath and wheezing.   Cardiovascular: Negative for chest pain and leg swelling.  Gastrointestinal: Negative for abdominal pain, diarrhea, nausea and vomiting. Pos for constipation Genitourinary: Negative for bladder incontinence, difficulty urinating, dysuria, frequency and hematuria.   Musculoskeletal: Negative for back pain, gait problem, neck pain and neck stiffness.  Skin: Negative for itching and rash.  Neurological: Negative for dizziness, extremity weakness, gait problem, headaches, light-headedness and seizures.  Hematological: Negative for adenopathy. Does not bruise/bleed easily.  Psychiatric/Behavioral: Negative for confusion, depression. The patient is not nervous/anxious.  Pos for difficulty sleeping   PHYSICAL EXAMINATION:  Blood pressure (!) 152/87, pulse 63, temperature 98.3 F (36.8 C), temperature source Tympanic, resp. rate 18, height _0  (1.499 m), weight 127 lb (57.6 kg), SpO2 100 %.  ECOG PERFORMANCE STATUS: 1  Physical Exam  Constitutional: Oriented to person, place, and time and well-developed, well-nourished, and in no distress. No distress.  HENT:  Head:  Normocephalic and atraumatic.  Mouth/Throat: Oropharynx is clear and moist. No oropharyngeal exudate.  Eyes: Conjunctivae are normal. Right eye exhibits no discharge. Left eye exhibits no discharge. No scleral icterus.  Neck: Normal range of motion. Neck supple.  Cardiovascular: Normal rate, regular rhythm, normal heart sounds   Pulmonary/Chest: Effort normal and breath sounds normal. No respiratory distress. No wheezes. No rales.  Abdominal:  Exhibits no distension. No tenderness to palpation. No masses.  Musculoskeletal: Normal range of motion.  Lymphadenopathy:    No cervical adenopathy.  Neurological: Alert and oriented to person, place, and time. Exhibits normal muscle tone. Coordination normal.  Skin: Skin is warm and dry. No rash noted. Not diaphoretic. No erythema. No pallor.  Psychiatric: Mood, memory and judgment normal.  Vitals reviewed.  LABORATORY DATA: Lab Results  Component Value Date   WBC 6.6 05/03/2022   HGB 11.2 (L) 05/03/2022   HCT 34.3 (L) 05/03/2022   MCV 80.9 05/03/2022   PLT 256 05/03/2022      Chemistry      Component Value Date/Time   NA 140 05/03/2022 0825   NA 139 09/29/2019 1406   K 3.7 05/03/2022 0825  CL 107 05/03/2022 0825   CO2 28 05/03/2022 0825   BUN 15 05/03/2022 0825   BUN 12 09/29/2019 1406   CREATININE 0.77 05/03/2022 0825      Component Value Date/Time   CALCIUM 8.6 (L) 05/03/2022 0825   ALKPHOS 52 05/03/2022 0825   AST 13 (L) 05/03/2022 0825   ALT 6 05/03/2022 0825   BILITOT 0.3 05/03/2022 0825       RADIOGRAPHIC STUDIES:  CT CHEST ABDOMEN PELVIS W CONTRAST  Result Date: 04/18/2022 CLINICAL DATA:  71 year old female with history of rectal cancer status. Follow-up study to evaluate for treatment response. * Tracking Code: BO * EXAM: CT CHEST, ABDOMEN, AND PELVIS WITH CONTRAST TECHNIQUE: Multidetector CT imaging of the chest, abdomen and pelvis was performed following the standard protocol during bolus administration of  intravenous contrast. RADIATION DOSE REDUCTION: This exam was performed according to the departmental dose-optimization program which includes automated exposure control, adjustment of the mA and/or kV according to patient size and/or use of iterative reconstruction technique. CONTRAST:  19m OMNIPAQUE IOHEXOL 300 MG/ML  SOLN COMPARISON:  PET-CT 11/24/2021. FINDINGS: CT CHEST FINDINGS Cardiovascular: Heart size is normal. There is no significant pericardial fluid, thickening or pericardial calcification. Atherosclerotic calcifications in the thoracic aorta. No definite coronary artery calcifications. Mediastinum/Nodes: No pathologically enlarged mediastinal or hilar lymph nodes. Esophagus is unremarkable in appearance. No axillary lymphadenopathy. Lungs/Pleura: Irregular-shaped macrolobulated nodule in the superior segment of the left lower lobe (axial image 55 of series 7) measuring 1.5 x 1.1 cm (previously 1.1 cm). No other new suspicious appearing pulmonary nodules or masses are noted. No acute consolidative airspace disease. No pleural effusions. Musculoskeletal: There are no aggressive appearing lytic or blastic lesions noted in the visualized portions of the skeleton. CT ABDOMEN PELVIS FINDINGS Hepatobiliary: No suspicious cystic or solid hepatic lesions. No intra or extrahepatic biliary ductal dilatation. Gallbladder is normal in appearance. Pancreas: No pancreatic mass. No pancreatic ductal dilatation. No pancreatic or peripancreatic fluid collections or inflammatory changes. Spleen: Unremarkable. Adrenals/Urinary Tract: Bilateral kidneys and bilateral adrenal glands are normal in appearance. No hydroureteronephrosis. Urinary bladder is unremarkable in appearance. Stomach/Bowel: The appearance of the stomach is normal. No pathologic dilatation of small bowel or colon. Normal appendix. Severe mass-like thickening of the rectum again noted, best demonstrated on axial image 100 of series 2, where the  posterolateral wall of the rectum measures up to 1.8 cm on the left. This mass-like mural thickening extends 9.7 cm proximal to the anorectal junction (sagittal image 102 of series 6). Haziness in the mesorectal fat. Vascular/Lymphatic: Atherosclerotic calcifications are noted throughout the abdominal aorta and pelvic vasculature. No aneurysm or dissection noted in the abdominal or pelvic vasculature. No definite lymphadenopathy noted in the abdomen or pelvis. Reproductive: Uterus and ovaries are atrophic. Other: No significant volume of ascites.  No pneumoperitoneum. Musculoskeletal: There are no aggressive appearing lytic or blastic lesions noted in the visualized portions of the skeleton. IMPRESSION: 1. Persistent mass-like thickening of the rectum which extends 9.7 cm proximal to the anorectal junction, likely reflective of residual neoplasm. There is haziness in the mesorectal fat, but no definite lymphadenopathy in the abdomen or pelvis. 2. Slight increased size of superior segment left lower lobe pulmonary nodule which has macrolobulated in appearance, currently measuring 1.5 x 1.1 cm. Whether this represents a solitary pulmonary metastasis or a primary bronchogenic neoplasm is uncertain. No new pulmonary nodules are otherwise noted. 3. Aortic atherosclerosis. Electronically Signed   By: DVinnie LangtonM.D.   On: 04/18/2022 08:11  ASSESSMENT & PLAN:  Maddalena Linarez is a 71 y.o. female with    1.  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 Colonoscopy 12/01/20 by Dr. Silverio Decamp, path from ascending colon polyp and rectal mass both showed adenocarcinoma.  -Staging CT CAP on 12/06/20 showed: perirectal lymph node involvement; single nonspecific left pulmonary nodule.  -Despite strong recommendation,  patient firmly and repeatedly declined IV chemo and surgery. She agreed to chemoRT with Xeloda, 01/04/21 - 02/14/21. She tolerated well. -she previously declined surveillance measures, however, she developed pencil-thin and decreased caliber of stool. Her CEA normalized after chemoRT but increased to 18.49 on 09/28/21. She agreed to work up, and CT AP on 10/07/21 showed enlargement of LLL nodule.  -Further work up with PET 11/24/21, showed: hypermetabolic LLL nodule; marked hypermetabolism corresponding to residual rectal wall thickening; no other evidence of hypermetabolic metastasis.  -bronchoscopy 12/13/21 by Dr. Valeta Harms, cytology negative for malignancy. Repeat biopsy was recommended, but she declined. -her most recent CEA on 12/16/21 was 31.56. -surveillance colonoscopy 01/31/22 with Dr. Silverio Decamp showed 10 cm partially obstructing rectal mass, 5-15 cm from anal verge. Biopsy of the mass confirmed invasive moderately differentiated adenocarcinoma.  -She agreed to restart Xeloda and started on 02/16/22. However, she did not start this as recommended. -restaging CT CAP on 04/17/22 showed: persistent rectal thickening extending 9.7 cm proximal to anorectal junction; no definite lymphadenopathy; slight increased size of superior LLL pulmonary nodule, currently 1.5 cm; no new pulmonary nodules or other metastatic disease.  Previous scan with IMA, and did not start Xeloda on 2 August, progression of her cancer is likely the natural course, less indicating failure of Xeloda.  -Dr. Burr Medico does not think Xeloda is adequate for her disease control.  She notes she is not currently working, but she still does not want surgery. Therefore, Dr. Burr Medico added oxaliplatin, which is IV infusion given every 3 weeks. She was supposed to start her first dose today.  -Today (05/03/22) unfortunately, it is unclear if and how the patient has been taking Xeloda. She initially stated she started this on 10/6 but missed a few days. She then stated  she started back taking this 3 days ago. She is unable to tell me how many tablets she takes, when she took this, and days that she stopped.  -Therefore, I do not feel comfortable starting treatment today. I spoke very directly with her and her interpretor is is very important that she track when she is taking her medications, when she stops her medications, and how much she is taking. It can be very dangerous to not be taking medications as directed by her medical professionals as this is chemotherapy.  -Her congressional nurse is unavailable today and on vacation to ask for clarification.  -Instead, we will delay her treatment by 1 week. Advised that she not take any of her Xeloda this week. We will start fresh next week for day 1 on 05/10/22.  -Additionally, we have sent her home to get her Xeloda to bring it to the clinic this afternoon. She is going to meet with Leron Croak from pharmacy this afternoon to fill her pill box and label it with the dates to begin her medications. She was advised to bring her pill box to every appointment so we may monitor that she is taking this correctly.  -We  will also give her a calendar today with her Xeloda scheduled. Once again, we will start fresh next week on 05/09/22 for day 1 of the cycle.  -lab reviewed, stable -F/U in 1 week to begin day 1. We will also arrange a 1 week follow up to ensure she is taking this appropriately and managing any adverse side effects of treatment.    2. Constipation -secondary to #1 - reviewed management. She notes miralax was not helpful. Dr. Burr Medico encouraged her to try higher dose.  -today, reports miralax is helping with constipation   3. Social support -She and her husband moved from [place] in 2014. Her children remain in [place], spanning ages 1-50. Our social worker [name] previously composed a letter for her to help her children get visas to visit her. -Ms. Delsignore is independent with ADLs but does not have a driver's  license. She told Dr. Burr Medico at her last appointment on 10/5 that she is not currently working. -She has orange card. She has been approved for free drug replacement through [facility], documents in [place]. -her husband has Alzheimer's, and she is his caregiver.  -She has Research officer, trade union and Optometrist.      PLAN -No treatment today. Send home to retrieve xeloda. Will meet with pharmacy today to fill her pillbox, obtain calendar, and review how to take her medication  -Start Xeloda cycle on 05/10/22 -Discussed importance of taking medications as instructed.  -Starting low-dose oxaliplatin 85 mg/m for first cycle next week, and increase subsequently if she tolerates well. -lab, f/u, and oxaliplatin in 1 and 4 weeks -1 week follow up visit in 2 weeks.    ADDENDUM: The patient did return to the clinic today and met with pharmacy, who provided extensive education, calendar, and labeled pill bottle. Pharmacy relayed to me that she came in with a full bottle of Xeloda. Unless this is an old bottle, it is likely she has not taken this at all.   No orders of the defined types were placed in this encounter.     The total time spent in the appointment was 30-39 minutes.   Cassandra L Heilingoetter, PA-C 05/03/22

## 2022-05-03 ENCOUNTER — Telehealth: Payer: Self-pay | Admitting: Pharmacist

## 2022-05-03 ENCOUNTER — Other Ambulatory Visit: Payer: Self-pay

## 2022-05-03 ENCOUNTER — Inpatient Hospital Stay: Payer: Medicaid Other

## 2022-05-03 ENCOUNTER — Encounter: Payer: Self-pay | Admitting: Hematology

## 2022-05-03 ENCOUNTER — Inpatient Hospital Stay (HOSPITAL_BASED_OUTPATIENT_CLINIC_OR_DEPARTMENT_OTHER): Payer: Medicaid Other | Admitting: Physician Assistant

## 2022-05-03 DIAGNOSIS — C20 Malignant neoplasm of rectum: Secondary | ICD-10-CM | POA: Diagnosis not present

## 2022-05-03 DIAGNOSIS — Z5111 Encounter for antineoplastic chemotherapy: Secondary | ICD-10-CM | POA: Diagnosis not present

## 2022-05-03 LAB — CMP (CANCER CENTER ONLY)
ALT: 6 U/L (ref 0–44)
AST: 13 U/L — ABNORMAL LOW (ref 15–41)
Albumin: 3.6 g/dL (ref 3.5–5.0)
Alkaline Phosphatase: 52 U/L (ref 38–126)
Anion gap: 5 (ref 5–15)
BUN: 15 mg/dL (ref 8–23)
CO2: 28 mmol/L (ref 22–32)
Calcium: 8.6 mg/dL — ABNORMAL LOW (ref 8.9–10.3)
Chloride: 107 mmol/L (ref 98–111)
Creatinine: 0.77 mg/dL (ref 0.44–1.00)
GFR, Estimated: >60 mL/min (ref >60)
Glucose, Bld: 174 mg/dL — ABNORMAL HIGH (ref 70–99)
Potassium: 3.7 mmol/L (ref 3.5–5.1)
Sodium: 140 mmol/L (ref 135–145)
Total Bilirubin: 0.3 mg/dL (ref 0.3–1.2)
Total Protein: 6.4 g/dL — ABNORMAL LOW (ref 6.5–8.1)

## 2022-05-03 LAB — CBC WITH DIFFERENTIAL (CANCER CENTER ONLY)
Abs Immature Granulocytes: 0.1 10*3/uL — ABNORMAL HIGH (ref 0.00–0.07)
Basophils Absolute: 0 10*3/uL (ref 0.0–0.1)
Basophils Relative: 1 %
Eosinophils Absolute: 0.1 10*3/uL (ref 0.0–0.5)
Eosinophils Relative: 1 %
HCT: 34.3 % — ABNORMAL LOW (ref 36.0–46.0)
Hemoglobin: 11.2 g/dL — ABNORMAL LOW (ref 12.0–15.0)
Immature Granulocytes: 2 %
Lymphocytes Relative: 14 %
Lymphs Abs: 0.9 10*3/uL (ref 0.7–4.0)
MCH: 26.4 pg (ref 26.0–34.0)
MCHC: 32.7 g/dL (ref 30.0–36.0)
MCV: 80.9 fL (ref 80.0–100.0)
Monocytes Absolute: 0.6 10*3/uL (ref 0.1–1.0)
Monocytes Relative: 8 %
Neutro Abs: 4.9 10*3/uL (ref 1.7–7.7)
Neutrophils Relative %: 74 %
Platelet Count: 256 10*3/uL (ref 150–400)
RBC: 4.24 MIL/uL (ref 3.87–5.11)
RDW: 16.9 % — ABNORMAL HIGH (ref 11.5–15.5)
WBC Count: 6.6 10*3/uL (ref 4.0–10.5)
nRBC: 0 % (ref 0.0–0.2)

## 2022-05-03 LAB — CEA (ACCESS): CEA (CHCC): 24.96 ng/mL — ABNORMAL HIGH (ref 0.00–5.00)

## 2022-05-03 NOTE — Progress Notes (Signed)
Spoke to pt through the interpreter to introduce myself as her Arboriculturist and to discuss the [employer].  Pt would like to apply and since she's not working and her husband isn't working she will provide a letter of support on 05/25/22.  Once received I will approve her for the grant.

## 2022-05-03 NOTE — Telephone Encounter (Signed)
Oral Chemotherapy Pharmacist Encounter  Met with patient, patient's friend, and interpreter for medication adherence reinforcement and pill box management.  Patient brought in Xeloda prescription that had #84 tablets in bottle. Pill box created for patient to represent dose change of Xeloda for next cycle with planned start date of 05/10/22.   Patient instructed to take Xeloda 56m tablets, 2 tablets (10025m by mouth in AM and 3 tabs (150058mby mouth in PM, within 30 minutes of finishing meals, for 14 days on, 7 days off, repeated every 21 days.  Directions thoroughly reinforced with patient through interpreter. Labeled pill box for days 1-14 reviewed with patient as well as difference in AM and PM doses and timing. Patient and patient's friend expressed understanding. Patient's friend is going to help patient set an alarm on her phone to reinforce medication adherence.   Medication calendar for next cycle of Xeloda given to patient. Patient knows to call the office with questions or concerns. Oral Chemotherapy Clinic phone number provided to patient. Patient reminded to bring in pill box to all follow up appointments for adherence checks.   RebLeron CroakharmD, BCPS, BCOHarrison Community Hospitalmatology/Oncology Clinical Pharmacist WesElvina Sidled [phone]/18/2023 3:26 PM

## 2022-05-04 ENCOUNTER — Other Ambulatory Visit: Payer: Self-pay

## 2022-05-06 ENCOUNTER — Other Ambulatory Visit: Payer: Self-pay

## 2022-05-08 ENCOUNTER — Other Ambulatory Visit (HOSPITAL_COMMUNITY): Payer: Self-pay

## 2022-05-08 ENCOUNTER — Encounter: Payer: Self-pay | Admitting: Hematology

## 2022-05-08 NOTE — Congregational Nurse Program (Signed)
Home visit to deliver medications ( gabapentin, lisinopril/hctz, and amlodipine) which were picked up from [facility].  Explained to patient that all of her medications look different because they were made by a different manufacturer but they are the same as her previous medications.  She expressed understanding. She has been out of medicine for 3 days. Again asked that she let CN know when she is down to 3 days of medicine.  Also reiterated previous conversation with interpreter Paulina Fusi when we told her that [facility] appointment was changed from 05/10/2022 to 05/11/2022 so that CN could accompany her as she had previously requested.  She will take Xeloda the morning of 10/26 prior to starting her IV chemo.  Reminded her to take Xeloda pill box with her to appointment and changed start date to 10/26 on calendar which was prepared by pharmacist at [facility].  Stated she is feeling good.  Jake Michaelis RN, Congregational Nurse [phone]

## 2022-05-09 NOTE — Progress Notes (Unsigned)
[place] OFFICE PROGRESS NOTE  Gaylan Gerold, DO [address] [place] [address]  DIAGNOSIS: f/u of rectal cancer  Oncology History Overview Note  Cancer Staging Rectal adenocarcinoma [facility]) Staging form: Colon and Rectum, AJCC 8th Edition - Clinical stage from 12/21/2020: Stage IIIB (cT3, cN1, cM0) - Unsigned    Rectal adenocarcinoma ([place])  08/26/2020 Miscellaneous   Initial presentation to PCP, reporting intermittent BRBPR since 02/2020    12/01/2020 Procedure   Colonoscopy by Dr. Silverio Decamp findings - The perianal and digital rectal examinations were normal. - A 15 mm polyp was found in the ascending colon. The polyp was semi-pedunculated. Resection and retrieval were complete. - A 25 mm polyp was found in the sigmoid colon. The polyp was pedunculated. Resection and retrieval were complete. - An infiltrative partially obstructing large mass was found in the proximal rectum. The mass was partially circumferential (involving one-half of the lumen circumference). The mass measured eight cm in length extending from 10 to 18cm from anal verge. This was biopsied with a cold forceps for histology. Proximal and distal opposite fold area of the mass lesion was tattooed with an injection of total 3 mL of Spot (carbon black).   12/01/2020 Initial Biopsy   Diagnosis 1. Colon, polyp(s), ascending x 1 - ADENOCARCINOMA ARISING IN A TUBULAR ADENOMA WITH HIGH-GRADE DYSPLASIA. SEE NOTE 2. Colon, sigmoid polyp, x1 - TUBULOVILLOUS ADENOMA(S) - NEGATIVE FOR HIGH-GRADE DYSPLASIA OR MALIGNANCY 3. Rectum, biopsy - ADENOCARCINOMA. SEE NOTE   12/01/2020 Cancer Staging   Cancer Staging Rectal adenocarcinoma [facility]) Staging form: Colon and Rectum, AJCC 8th Edition - Clinical stage from 12/21/2020: Stage IIIB (cT3, cN1, cM0) - Unsigned    12/06/2020 Imaging   CT CAP with contrast IMPRESSION: 1. There is partially circumferential soft tissue thickening of the mid to superior rectum,  approximately 5 cm in length and the inferior extent approximately 6 cm above the anal verge, consistent with primary rectal malignancy identified by colonoscopy. 2. There appear to be abnormally enlarged perirectal lymph nodes posteriorly about the superior rectum measuring up to 1.0 x 0.8 cm. Findings are suspicious for perirectal nodal metastatic disease, however rectal MRI is the test of choice for initial local staging of rectal cancer. 3. There is a 4 mm nonspecific pulmonary nodule of the superior segment left lower lobe, statistically most likely incidental, infectious or inflammatory, although nonspecific and isolated metastatic disease is not strictly excluded. Attention on follow-up. 4. No other evidence of metastatic disease in the chest, abdomen, or pelvis. Aortic Atherosclerosis (ICD10-I70.0).   12/09/2020 Imaging   Local staging MRI pelvis without contrast IMPRESSION: 4.9 cm circumferential mid/lower rectal mass, corresponding to the patient's newly diagnosed rectal cancer. Rectal adenocarcinoma T stage: T3c Rectal adenocarcinoma N stage:  N1 Distance from tumor to the internal anal sphincter is 3.7 cm.   12/21/2020 Initial Diagnosis   Rectal adenocarcinoma ([place])   01/04/2021 -  Chemotherapy   Concurrent chemoradiation with Xeloda 1030m in the AM and 15088min the PM on days of Radiation starting 01/04/21.       01/04/2021 - 02/11/2021 Radiation Therapy   Concurrent chemoradiation with Dr MoLisbeth Renshawnd Xeloda starting 01/04/21.    01/31/2022 Procedure   Colonoscopy, Dr. NaSilverio DecampFindings: - An infiltrative partially obstructing large mass was found in the rectum. The mass was circumferential. The mass measured ten cm in length, extending from 5-15cm from anal verge. In addition, rectal diameter at narrowest approx twelve mm. Oozing was present. Biopsies were taken with  a cold forceps for histology.  Impression: - Hemorrhoids found on perianal exam. - Likely malignant  partially obstructing tumor in the rectum. Biopsied. - Non-bleeding external and internal hemorrhoids.   01/31/2022 Pathology Results   Diagnosis Rectum, biopsy INVASIVE MODERATELY DIFFERENTIATED ADENOCARCINOMA   05/10/2022 -  Chemotherapy   Patient is on Treatment Plan : COLORECTAL Xelox ([place]ox)(130/850) q21d       CURRENT THERAPY: Xeloda, q21d, starting 02/16/22             -dose: $Remov'1500mg'yTFVQR$  BID days 1-14   INTERVAL HISTORY: Jillian Lutz 71 y.o. female returns to the clinic today for a follow-up visit accompanied by her interpretor and CN.  The patient has been reluctant to start treatment for several months.  She was last seen by Dr. Burr Medico on 04/20/2022,  the patient had disease progression, however, she failed to start her Xeloda as recommended.  Therefore, the disease progression was likely secondary to the natural disease course as opposed to inefficacy of the Xeloda. Given the amount of disease, Dr. Burr Medico does not think Xeloda alone will adequately control her condition.  Therefore, Dr. Burr Medico recommended CAPOX. She is expected to start dose reduced oxaliplatin today IV every 3 weeks.   I saw the patient last week. There seemed to be some confusion how to take her medication; therefore, she had an appointment with the oral chemotherapy pharmacist who labeled a pillbox for her and gave her a calendar on when to start and stop Xeloda. She took her first xeloda pill this morning. She does not have any questions at this time. The patient is going to bring her pill box to every appointment so we can ensure this is being taken correctly.   She reports she is doing overall "about the same" since the last time she was seen.  She denies any fever, chills, night sweats, or weight loss. She denies nausea, vomiting, diarrhea, or constipation. She reports her bowel passage brings rectal pain, and her stool comes in small amounts. She reports her stool is yellow in color, she denies passing blood in her stool, she  denies black stool. She also endorses rectal pain sometimes at rest in addition to bowel movements. Her last bowel movement was this morning. She denies abdominal pain. She denies dyspnea, cough, chest pain. She sometimes has a sharp shooting pain down her left buttock and down her thigh which started a few days ago. This occurs with certain activities. Denies pain at rest. Denies history of sciatica. She has not taken anything for this.   The patient is here today for evaluation repeat blood work before undergoing the next cycle of treatment.       MEDICAL HISTORY: Past Medical History:  Diagnosis Date   Bilateral wrist pain 10/25/2017   Hypertension    Neck mass 1998   Unknown biopsy results. Excised in [place].   Pre-diabetes    Rectal cancer ([place])    Trigger finger, acquired 02/08/2017    ALLERGIES:  is allergic to no known allergies.  MEDICATIONS:  Current Outpatient Medications  Medication Sig Dispense Refill   amLODipine (NORVASC) 5 MG tablet Take 1 tablet (5 mg total) by mouth daily. 90 tablet 3   capecitabine (XELODA) 500 MG tablet Take 3 tablets (1,500 mg total) by mouth 2 (two) times daily after a meal. Take for 14 days on, 7 days off. Repeat every 21 days. 84 tablet 1   gabapentin (NEURONTIN) 100 MG capsule Take 1 capsule (100 mg total) by mouth  3 (three) times daily as needed. 90 capsule 2   lisinopril-hydrochlorothiazide (ZESTORETIC) 20-12.5 MG tablet Take 2 tablets by mouth daily. 60 tablet 3   Multiple Vitamins-Minerals (MULTIVITAMIN WITH MINERALS) tablet Take 1 tablet by mouth daily. Gummy 50 mg     prochlorperazine (COMPAZINE) 10 MG tablet Take 1 tablet (10 mg total) by mouth every 6 (six) hours as needed. 30 tablet 2   No current facility-administered medications for this visit.    SURGICAL HISTORY:  Past Surgical History:  Procedure Laterality Date   BRONCHIAL BIOPSY  12/13/2021   Procedure: BRONCHIAL BIOPSIES;  Surgeon: Josephine Igo, DO;  Location: [facility]  ENDOSCOPY;  Service: Pulmonary;;   BRONCHIAL NEEDLE ASPIRATION BIOPSY  12/13/2021   Procedure: BRONCHIAL NEEDLE ASPIRATION BIOPSIES;  Surgeon: Josephine Igo, DO;  Location: [facility] ENDOSCOPY;  Service: Pulmonary;;   NECK SURGERY Left 1998   Excision of mass in [place]   VIDEO BRONCHOSCOPY WITH RADIAL ENDOBRONCHIAL ULTRASOUND  12/13/2021   Procedure: RADIAL ENDOBRONCHIAL ULTRASOUND;  Surgeon: Josephine Igo, DO;  Location: [facility] ENDOSCOPY;  Service: Pulmonary;;    REVIEW OF SYSTEMS:   Review of Systems  Constitutional: Negative for appetite change, chills, fatigue, fever and unexpected weight change.  HENT:   Negative for mouth sores, nosebleeds, sore throat and trouble swallowing.   Eyes: Negative for eye problems and icterus.  Respiratory: Negative for cough, hemoptysis, shortness of breath and wheezing.   Cardiovascular: Negative for chest pain and leg swelling.  Gastrointestinal: Negative for abdominal pain, diarrhea, nausea and vomiting. Pos for constipation Genitourinary: Negative for bladder incontinence, difficulty urinating, dysuria, frequency and hematuria.   Musculoskeletal: Negative for back pain, gait problem, neck pain and neck stiffness.  Skin: Negative for itching and rash.  Neurological: Negative for dizziness, extremity weakness, gait problem, headaches, light-headedness and seizures.  Hematological: Negative for adenopathy. Does not bruise/bleed easily.  Psychiatric/Behavioral: Negative for confusion, depression. The patient is not nervous/anxious.  Pos for difficulty sleeping     PHYSICAL EXAMINATION:  Blood pressure (!) 143/77, pulse 74, temperature 98.4 F (36.9 C), temperature source Oral, resp. rate 15, weight 128 lb 14.4 oz (58.5 kg), SpO2 99 %.  ECOG PERFORMANCE STATUS: 1  Physical Exam  Constitutional: Oriented to person, place, and time and well-developed, well-nourished, and in no distress.  HENT:  Head: Normocephalic and atraumatic.  Mouth/Throat:  Oropharynx is clear and moist. No oropharyngeal exudate.  Eyes: Conjunctivae are normal. Right eye exhibits no discharge. Left eye exhibits no discharge. No scleral icterus.  Neck: Normal range of motion. Neck supple.  Cardiovascular: Normal rate, regular rhythm, normal heart sounds   Pulmonary/Chest: Effort normal and breath sounds normal. No respiratory distress. No wheezes. No rales.  Abdominal:  Exhibits no distension. No tenderness to palpation. No masses.  Musculoskeletal: Normal range of motion.  Lymphadenopathy:    No cervical adenopathy.  Neurological: Alert and oriented to person, place, and time. Exhibits normal muscle tone. Coordination normal.  Skin: Skin is warm and dry. No rash noted. Not diaphoretic. No erythema. No pallor.  Psychiatric: Mood, memory and judgment normal.  Vitals reviewed.  LABORATORY DATA: Lab Results  Component Value Date   WBC 6.1 05/11/2022   HGB 11.7 (L) 05/11/2022   HCT 34.9 (L) 05/11/2022   MCV 80.4 05/11/2022   PLT 209 05/11/2022      Chemistry      Component Value Date/Time   NA 139 05/11/2022 1009   NA 139 09/29/2019 1406   K 3.5 05/11/2022 1009  CL 104 05/11/2022 1009   CO2 29 05/11/2022 1009   BUN 18 05/11/2022 1009   BUN 12 09/29/2019 1406   CREATININE 0.77 05/11/2022 1009      Component Value Date/Time   CALCIUM 8.6 (L) 05/11/2022 1009   ALKPHOS 55 05/11/2022 1009   AST 15 05/11/2022 1009   ALT 7 05/11/2022 1009   BILITOT 0.3 05/11/2022 1009       RADIOGRAPHIC STUDIES:  CT CHEST ABDOMEN PELVIS W CONTRAST  Result Date: 04/18/2022 CLINICAL DATA:  71 year old female with history of rectal cancer status. Follow-up study to evaluate for treatment response. * Tracking Code: BO * EXAM: CT CHEST, ABDOMEN, AND PELVIS WITH CONTRAST TECHNIQUE: Multidetector CT imaging of the chest, abdomen and pelvis was performed following the standard protocol during bolus administration of intravenous contrast. RADIATION DOSE REDUCTION: This  exam was performed according to the departmental dose-optimization program which includes automated exposure control, adjustment of the mA and/or kV according to patient size and/or use of iterative reconstruction technique. CONTRAST:  168mL OMNIPAQUE IOHEXOL 300 MG/ML  SOLN COMPARISON:  PET-CT 11/24/2021. FINDINGS: CT CHEST FINDINGS Cardiovascular: Heart size is normal. There is no significant pericardial fluid, thickening or pericardial calcification. Atherosclerotic calcifications in the thoracic aorta. No definite coronary artery calcifications. Mediastinum/Nodes: No pathologically enlarged mediastinal or hilar lymph nodes. Esophagus is unremarkable in appearance. No axillary lymphadenopathy. Lungs/Pleura: Irregular-shaped macrolobulated nodule in the superior segment of the left lower lobe (axial image 55 of series 7) measuring 1.5 x 1.1 cm (previously 1.1 cm). No other new suspicious appearing pulmonary nodules or masses are noted. No acute consolidative airspace disease. No pleural effusions. Musculoskeletal: There are no aggressive appearing lytic or blastic lesions noted in the visualized portions of the skeleton. CT ABDOMEN PELVIS FINDINGS Hepatobiliary: No suspicious cystic or solid hepatic lesions. No intra or extrahepatic biliary ductal dilatation. Gallbladder is normal in appearance. Pancreas: No pancreatic mass. No pancreatic ductal dilatation. No pancreatic or peripancreatic fluid collections or inflammatory changes. Spleen: Unremarkable. Adrenals/Urinary Tract: Bilateral kidneys and bilateral adrenal glands are normal in appearance. No hydroureteronephrosis. Urinary bladder is unremarkable in appearance. Stomach/Bowel: The appearance of the stomach is normal. No pathologic dilatation of small bowel or colon. Normal appendix. Severe mass-like thickening of the rectum again noted, best demonstrated on axial image 100 of series 2, where the posterolateral wall of the rectum measures up to 1.8 cm on the  left. This mass-like mural thickening extends 9.7 cm proximal to the anorectal junction (sagittal image 102 of series 6). Haziness in the mesorectal fat. Vascular/Lymphatic: Atherosclerotic calcifications are noted throughout the abdominal aorta and pelvic vasculature. No aneurysm or dissection noted in the abdominal or pelvic vasculature. No definite lymphadenopathy noted in the abdomen or pelvis. Reproductive: Uterus and ovaries are atrophic. Other: No significant volume of ascites.  No pneumoperitoneum. Musculoskeletal: There are no aggressive appearing lytic or blastic lesions noted in the visualized portions of the skeleton. IMPRESSION: 1. Persistent mass-like thickening of the rectum which extends 9.7 cm proximal to the anorectal junction, likely reflective of residual neoplasm. There is haziness in the mesorectal fat, but no definite lymphadenopathy in the abdomen or pelvis. 2. Slight increased size of superior segment left lower lobe pulmonary nodule which has macrolobulated in appearance, currently measuring 1.5 x 1.1 cm. Whether this represents a solitary pulmonary metastasis or a primary bronchogenic neoplasm is uncertain. No new pulmonary nodules are otherwise noted. 3. Aortic atherosclerosis. Electronically Signed   By: Vinnie Langton M.D.   On: 04/18/2022 08:11  ASSESSMENT/PLAN:  Jillian Lutz is a 71 y.o. female with    1.  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 Colonoscopy 12/01/20 by Dr. Silverio Decamp, path from ascending colon polyp and rectal mass both showed adenocarcinoma.  -Staging CT CAP on 12/06/20 showed: perirectal lymph node involvement; single nonspecific left pulmonary nodule.  -Despite strong recommendation, patient firmly and repeatedly declined IV chemo and surgery. She  agreed to chemoRT with Xeloda, 01/04/21 - 02/14/21. She tolerated well. -she previously declined surveillance measures, however, she developed pencil-thin and decreased caliber of stool. Her CEA normalized after chemoRT but increased to 18.49 on 09/28/21. She agreed to work up, and CT AP on 10/07/21 showed enlargement of LLL nodule.  -Further work up with PET 11/24/21, showed: hypermetabolic LLL nodule; marked hypermetabolism corresponding to residual rectal wall thickening; no other evidence of hypermetabolic metastasis.  -bronchoscopy 12/13/21 by Dr. Valeta Harms, cytology negative for malignancy. Repeat biopsy was recommended, but she declined. -her most recent CEA on 12/16/21 was 31.56. -surveillance colonoscopy 01/31/22 with Dr. Silverio Decamp showed 10 cm partially obstructing rectal mass, 5-15 cm from anal verge. Biopsy of the mass confirmed invasive moderately differentiated adenocarcinoma.  -She agreed to restart Xeloda and started on 02/16/22. However, she did not start this as recommended. -restaging CT CAP on 04/17/22 showed: persistent rectal thickening extending 9.7 cm proximal to anorectal junction; no definite lymphadenopathy; slight increased size of superior LLL pulmonary nodule, currently 1.5 cm; no new pulmonary nodules or other metastatic disease.  Previous scan with IMA, and did not start Xeloda on 2 August, progression of her cancer is likely the natural course, less indicating failure of Xeloda.  -Dr. Burr Medico does not think Xeloda is adequate for her disease control.  She notes she is not currently working, but she still does not want surgery. Therefore, Dr. Burr Medico added oxaliplatin, which is IV infusion given every 3 weeks. She is expected to start this today. Last week, she met with the oral chemotherapy pharmacist for more teaching, calendar review, and who reviewed in depth how to take her Xeloda.  -Today, the patient has a good understanding. Labs were reviewed. Recommend starting with treatment today.    -She was advised to bring her pill box to every appointment so we may monitor that she is taking this correctly.  --F/U in 1 week for toxicity check -I gave the patient and her CN a copy of her schedule -Labs and follow up with Dr. Burr Medico in 3 weeks with cycle #2 of oxaliplatin    2. Constipation -secondary to #1 - reviewed management. She notes miralax was not helpful. Dr. Burr Medico encouraged her to try higher dose.  -Reports miralax is helping with constipation   3. Social support -She and her husband moved from [place] in 2014. Her children remain in [place], spanning ages 83-50. Our social worker [name] previously composed a letter for her to help her children get visas to visit her. -Ms. Lemus is independent with ADLs but does not have a driver's license. She told Dr. Burr Medico at her last appointment on 10/5 that she is not currently working. -She has orange card. She has been approved for free drug replacement through [facility], documents in [place]. -her husband has Alzheimer's, and she is his caregiver.  -She has Research officer, trade union and Optometrist. [name] is not available on Wednesdays. Therefore,  we have moved her infusion appointments to Thursday so [name] can accompany her.      PLAN  -Starting low-dose oxaliplatin 85 mg/m for first cycle today, and increase subsequently if she tolerates well. -One week toxicity check next week -Labs, f/u, and oxaliplatin in 3 and 6 weeks.  -She will use tylenol if needed for her pain if needed -Reviewed how to take her xeloda again. She will bring her pill box to her next appointment.         No orders of the defined types were placed in this encounter.    The total time spent in the appointment was 20-29 minutes.   Isiah Scheel L Eboni Coval, PA-C 05/11/22

## 2022-05-10 ENCOUNTER — Telehealth: Payer: Self-pay

## 2022-05-10 ENCOUNTER — Inpatient Hospital Stay: Payer: Medicaid Other | Admitting: Physician Assistant

## 2022-05-10 ENCOUNTER — Ambulatory Visit: Payer: Medicaid Other

## 2022-05-10 ENCOUNTER — Inpatient Hospital Stay: Payer: Medicaid Other

## 2022-05-10 MED FILL — Dexamethasone Sodium Phosphate Inj 100 MG/10ML: INTRAMUSCULAR | Qty: 1 | Status: AC

## 2022-05-10 NOTE — Telephone Encounter (Signed)
Phone call to patient with interpreter [name] assisting.  Reminded patient of 10:00 am appointment tomorrow at [facility] and that she should take all medications including Xeloda prior to appointment.  She will bring pill box with her.  Transportation will pick her up at 9:30.  States she received Medicaid card today and will bring it with her.Jake Michaelis RN, [place] Nurse [phone]

## 2022-05-11 ENCOUNTER — Encounter: Payer: Self-pay | Admitting: Hematology

## 2022-05-11 ENCOUNTER — Other Ambulatory Visit: Payer: Self-pay

## 2022-05-11 ENCOUNTER — Inpatient Hospital Stay: Payer: Medicaid Other

## 2022-05-11 ENCOUNTER — Inpatient Hospital Stay: Payer: Medicaid Other | Admitting: Hematology

## 2022-05-11 ENCOUNTER — Inpatient Hospital Stay (HOSPITAL_BASED_OUTPATIENT_CLINIC_OR_DEPARTMENT_OTHER): Payer: Medicaid Other | Admitting: Physician Assistant

## 2022-05-11 ENCOUNTER — Other Ambulatory Visit (HOSPITAL_COMMUNITY): Payer: Self-pay

## 2022-05-11 VITALS — BP 124/76 | HR 63 | Temp 98.1°F | Resp 16

## 2022-05-11 VITALS — BP 143/77 | HR 74 | Temp 98.4°F | Resp 15 | Wt 128.9 lb

## 2022-05-11 DIAGNOSIS — Z5111 Encounter for antineoplastic chemotherapy: Secondary | ICD-10-CM | POA: Diagnosis not present

## 2022-05-11 DIAGNOSIS — C20 Malignant neoplasm of rectum: Secondary | ICD-10-CM

## 2022-05-11 LAB — CMP (CANCER CENTER ONLY)
ALT: 7 U/L (ref 0–44)
AST: 15 U/L (ref 15–41)
Albumin: 3.5 g/dL (ref 3.5–5.0)
Alkaline Phosphatase: 55 U/L (ref 38–126)
Anion gap: 6 (ref 5–15)
BUN: 18 mg/dL (ref 8–23)
CO2: 29 mmol/L (ref 22–32)
Calcium: 8.6 mg/dL — ABNORMAL LOW (ref 8.9–10.3)
Chloride: 104 mmol/L (ref 98–111)
Creatinine: 0.77 mg/dL (ref 0.44–1.00)
GFR, Estimated: >60 mL/min (ref >60)
Glucose, Bld: 220 mg/dL — ABNORMAL HIGH (ref 70–99)
Potassium: 3.5 mmol/L (ref 3.5–5.1)
Sodium: 139 mmol/L (ref 135–145)
Total Bilirubin: 0.3 mg/dL (ref 0.3–1.2)
Total Protein: 6.4 g/dL — ABNORMAL LOW (ref 6.5–8.1)

## 2022-05-11 LAB — CBC WITH DIFFERENTIAL (CANCER CENTER ONLY)
Abs Immature Granulocytes: 0.03 10*3/uL (ref 0.00–0.07)
Basophils Absolute: 0.1 10*3/uL (ref 0.0–0.1)
Basophils Relative: 1 %
Eosinophils Absolute: 0.2 10*3/uL (ref 0.0–0.5)
Eosinophils Relative: 4 %
HCT: 34.9 % — ABNORMAL LOW (ref 36.0–46.0)
Hemoglobin: 11.7 g/dL — ABNORMAL LOW (ref 12.0–15.0)
Immature Granulocytes: 1 %
Lymphocytes Relative: 15 %
Lymphs Abs: 0.9 10*3/uL (ref 0.7–4.0)
MCH: 27 pg (ref 26.0–34.0)
MCHC: 33.5 g/dL (ref 30.0–36.0)
MCV: 80.4 fL (ref 80.0–100.0)
Monocytes Absolute: 0.6 10*3/uL (ref 0.1–1.0)
Monocytes Relative: 10 %
Neutro Abs: 4.3 10*3/uL (ref 1.7–7.7)
Neutrophils Relative %: 69 %
Platelet Count: 209 10*3/uL (ref 150–400)
RBC: 4.34 MIL/uL (ref 3.87–5.11)
RDW: 17.2 % — ABNORMAL HIGH (ref 11.5–15.5)
WBC Count: 6.1 10*3/uL (ref 4.0–10.5)
nRBC: 0 % (ref 0.0–0.2)

## 2022-05-11 LAB — CEA (ACCESS): CEA (CHCC): 22.05 ng/mL — ABNORMAL HIGH (ref 0.00–5.00)

## 2022-05-11 MED ORDER — OXALIPLATIN CHEMO INJECTION 100 MG/20ML
85.0000 mg/m2 | Freq: Once | INTRAVENOUS | Status: AC
  Administered 2022-05-11: 130 mg via INTRAVENOUS
  Filled 2022-05-11: qty 20

## 2022-05-11 MED ORDER — PALONOSETRON HCL INJECTION 0.25 MG/5ML
0.2500 mg | Freq: Once | INTRAVENOUS | Status: AC
  Administered 2022-05-11: 0.25 mg via INTRAVENOUS
  Filled 2022-05-11: qty 5

## 2022-05-11 MED ORDER — SODIUM CHLORIDE 0.9 % IV SOLN
10.0000 mg | Freq: Once | INTRAVENOUS | Status: AC
  Administered 2022-05-11: 10 mg via INTRAVENOUS
  Filled 2022-05-11: qty 1
  Filled 2022-05-11: qty 10

## 2022-05-11 MED ORDER — DEXTROSE 5 % IV SOLN: Freq: Once | INTRAVENOUS | Status: AC

## 2022-05-11 NOTE — Congregational Nurse Program (Signed)
Home visit to take Compazine (which was picked up from [place]) and thermometer.  Patient stated she feels warm. Temp 98.4.  Told her to call CN if she starts to sweat or has chills and went over other symptoms for which she should notify doctor.  Reviewed instructions for compazine and she voiced understanding.  Jake Michaelis RN, Congregational Nurse [phone]

## 2022-05-11 NOTE — Patient Instructions (Signed)
[facility]  Discharge Instructions: Thank you for choosing [facility] to provide your oncology and hematology care.   If you have a lab appointment with the Cancer Center, please go directly to the Cancer Center and check in at the registration area.   Wear comfortable clothing and clothing appropriate for easy access to any Portacath or PICC line.   We strive to give you quality time with your provider. You may need to reschedule your appointment if you arrive late (15 or more minutes).  Arriving late affects you and other patients whose appointments are after yours.  Also, if you miss three or more appointments without notifying the office, you may be dismissed from the clinic at the provider's discretion.      For prescription refill requests, have your pharmacy contact our office and allow 72 hours for refills to be completed.    Today you received the following chemotherapy and/or immunotherapy agents: Oxaliplatin      To help prevent nausea and vomiting after your treatment, we encourage you to take your nausea medication as directed.  BELOW ARE SYMPTOMS THAT SHOULD BE REPORTED IMMEDIATELY: *FEVER GREATER THAN 100.4 F (38 C) OR HIGHER *CHILLS OR SWEATING *NAUSEA AND VOMITING THAT IS NOT CONTROLLED WITH YOUR NAUSEA MEDICATION *UNUSUAL SHORTNESS OF BREATH *UNUSUAL BRUISING OR BLEEDING *URINARY PROBLEMS (pain or burning when urinating, or frequent urination) *BOWEL PROBLEMS (unusual diarrhea, constipation, pain near the anus) TENDERNESS IN MOUTH AND THROAT WITH OR WITHOUT PRESENCE OF ULCERS (sore throat, sores in mouth, or a toothache) UNUSUAL RASH, SWELLING OR PAIN  UNUSUAL VAGINAL DISCHARGE OR ITCHING   Items with * indicate a potential emergency and should be followed up as soon as possible or go to the Emergency Department if any problems should occur.  Please show the CHEMOTHERAPY ALERT CARD or IMMUNOTHERAPY ALERT CARD at check-in  to the Emergency Department and triage nurse.  Should you have questions after your visit or need to cancel or reschedule your appointment, please contact [facility]  Dept: [phone]  and follow the prompts.  Office hours are 8:00 a.m. to 4:30 p.m. Monday - Friday. Please note that voicemails left after 4:00 p.m. may not be returned until the following business day.  We are closed weekends and major holidays. You have access to a nurse at all times for urgent questions. Please call the main number to the clinic Dept: [phone] and follow the prompts.   For any non-urgent questions, you may also contact your provider using MyChart. We now offer e-Visits for anyone 18 and older to request care online for non-urgent symptoms. For details visit [email or web address].   Also download the MyChart app! Go to the app store, search "MyChart", open the app, select [facility], and log in with your MyChart username and password.  Masks are optional in the cancer centers. If you would like for your care team to wear a mask while they are taking care of you, please let them know. You may have one support person who is at least 71 years old accompany you for your appointments. Oxaliplatin Injection What is this medication? OXALIPLATIN (ox AL i PLA tin) treats some types of cancer. It works by slowing down the growth of cancer cells. This medicine may be used for other purposes; ask your health care provider or pharmacist if you have questions. COMMON BRAND NAME(S): Eloxatin What should I tell my care team before I take this medication?   They need to know if you have any of these conditions: Heart disease History of irregular heartbeat or rhythm Liver disease Low blood cell levels (white cells, red cells, and platelets) Lung or breathing disease, such as asthma Take medications that treat or prevent blood clots Tingling of the fingers, toes, or other nerve disorder An  unusual or allergic reaction to oxaliplatin, other medications, foods, dyes, or preservatives If you or your partner are pregnant or trying to get pregnant Breast-feeding How should I use this medication? This medication is injected into a vein. It is given by your care team in a hospital or clinic setting. Talk to your care team about the use of this medication in children. Special care may be needed. Overdosage: If you think you have taken too much of this medicine contact a poison control center or emergency room at once. NOTE: This medicine is only for you. Do not share this medicine with others. What if I miss a dose? Keep appointments for follow-up doses. It is important not to miss a dose. Call your care team if you are unable to keep an appointment. What may interact with this medication? Do not take this medication with any of the following: Cisapride Dronedarone Pimozide Thioridazine This medication may also interact with the following: Aspirin and aspirin-like medications Certain medications that treat or prevent blood clots, such as warfarin, apixaban, dabigatran, and rivaroxaban Cisplatin Cyclosporine Diuretics Medications for infection, such as acyclovir, adefovir, amphotericin B, bacitracin, cidofovir, foscarnet, ganciclovir, gentamicin, pentamidine, vancomycin NSAIDs, medications for pain and inflammation, such as ibuprofen or naproxen Other medications that cause heart rhythm changes Pamidronate Zoledronic acid This list may not describe all possible interactions. Give your health care provider a list of all the medicines, herbs, non-prescription drugs, or dietary supplements you use. Also tell them if you smoke, drink alcohol, or use illegal drugs. Some items may interact with your medicine. What should I watch for while using this medication? Your condition will be monitored carefully while you are receiving this medication. You may need blood work while taking this  medication. This medication may make you feel generally unwell. This is not uncommon as chemotherapy can affect healthy cells as well as cancer cells. Report any side effects. Continue your course of treatment even though you feel ill unless your care team tells you to stop. This medication may increase your risk of getting an infection. Call your care team for advice if you get a fever, chills, sore throat, or other symptoms of a cold or flu. Do not treat yourself. Try to avoid being around people who are sick. Avoid taking medications that contain aspirin, acetaminophen, ibuprofen, naproxen, or ketoprofen unless instructed by your care team. These medications may hide a fever. Be careful brushing or flossing your teeth or using a toothpick because you may get an infection or bleed more easily. If you have any dental work done, tell your dentist you are receiving this medication. This medication can make you more sensitive to cold. Do not drink cold drinks or use ice. Cover exposed skin before coming in contact with cold temperatures or cold objects. When out in cold weather wear warm clothing and cover your mouth and nose to warm the air that goes into your lungs. Tell your care team if you get sensitive to the cold. Talk to your care team if you or your partner are pregnant or think either of you might be pregnant. This medication can cause serious birth defects if taken during   pregnancy and for 9 months after the last dose. A negative pregnancy test is required before starting this medication. A reliable form of contraception is recommended while taking this medication and for 9 months after the last dose. Talk to your care team about effective forms of contraception. Do not father a child while taking this medication and for 6 months after the last dose. Use a condom while having sex during this time period. Do not breastfeed while taking this medication and for 3 months after the last dose. This  medication may cause infertility. Talk to your care team if you are concerned about your fertility. What side effects may I notice from receiving this medication? Side effects that you should report to your care team as soon as possible: Allergic reactions--skin rash, itching, hives, swelling of the face, lips, tongue, or throat Bleeding--bloody or black, tar-like stools, vomiting blood or brown material that looks like coffee grounds, red or dark brown urine, small red or purple spots on skin, unusual bruising or bleeding Dry cough, shortness of breath or trouble breathing Heart rhythm changes--fast or irregular heartbeat, dizziness, feeling faint or lightheaded, chest pain, trouble breathing Infection--fever, chills, cough, sore throat, wounds that don't heal, pain or trouble when passing urine, general feeling of discomfort or being unwell Liver injury--right upper belly pain, loss of appetite, nausea, light-colored stool, dark yellow or brown urine, yellowing skin or eyes, unusual weakness or fatigue Low red blood cell level--unusual weakness or fatigue, dizziness, headache, trouble breathing Muscle injury--unusual weakness or fatigue, muscle pain, dark yellow or brown urine, decrease in amount of urine Pain, tingling, or numbness in the hands or feet Sudden and severe headache, confusion, change in vision, seizures, which may be signs of posterior reversible encephalopathy syndrome (PRES) Unusual bruising or bleeding Side effects that usually do not require medical attention (report to your care team if they continue or are bothersome): Diarrhea Nausea Pain, redness, or swelling with sores inside the mouth or throat Unusual weakness or fatigue Vomiting This list may not describe all possible side effects. Call your doctor for medical advice about side effects. You may report side effects to FDA at 1-800-FDA-1088. Where should I keep my medication? This medication is given in a hospital or  clinic. It will not be stored at home. NOTE: This sheet is a summary. It may not cover all possible information. If you have questions about this medicine, talk to your doctor, pharmacist, or health care provider.  2023 Elsevier/Gold Standard (2007-08-24 00:00:00)  

## 2022-05-11 NOTE — Progress Notes (Signed)
Pt was approved for the $10 grant in the past by Claire Shown and still has funds available to use.

## 2022-05-12 ENCOUNTER — Encounter: Payer: Self-pay | Admitting: Hematology

## 2022-05-12 ENCOUNTER — Telehealth: Payer: Self-pay | Admitting: *Deleted

## 2022-05-12 NOTE — Progress Notes (Signed)
Correction** approved for the $1000 grant.

## 2022-05-12 NOTE — Telephone Encounter (Signed)
Called patient for chemo follow up using interpreter. Pt states she is eating and drinking fine, no nausea or vomiting, No concerns. Wanted to know if it is OK to get flu shot. Dr Burr Medico says OK for flu shot.

## 2022-05-12 NOTE — Telephone Encounter (Signed)
-----   Message from Alvera Singh, RN sent at 05/11/2022  5:07 PM EDT ----- Regarding: first timer First time oxaliplatin- tol. Well. 05/11/22 Dr. Burr Medico- speaks no English.

## 2022-05-13 ENCOUNTER — Ambulatory Visit: Payer: Medicaid Other

## 2022-05-15 NOTE — Congregational Nurse Program (Signed)
Home visit to deliver Jillian Lutz.  Patient called earlier to state that she had not had BM since 05/12/2022 and requested CN notify Dr.Feng.  Staff message sent and Dr.Feng recommended Milk of Magnesia 1/2 bottle and if after 3-4 hours no results she should drink the other half bottle.  Reminded patient of appointment on 05/17/2022 with Dr. Ernestina Penna NP. Transportation will be requested.  Jake Michaelis RN, Congregational Nurse [phone]

## 2022-05-16 NOTE — Progress Notes (Unsigned)
Jillian Lutz   [phone] [phone]   Clinic Follow up Note   Patient Care Team: Jillian Gerold, DO as PCP - General (Internal Medicine) Jillian Finner, RN (Inactive) as Oncology Nurse Navigator Jillian Feeling, NP as Nurse Practitioner (Nurse Practitioner) Jillian Merle, MD as Consulting Physician (Hematology and Oncology) Jillian Rudd, MD as Consulting Physician (Radiation Oncology) Jillian Lutz, Jillian Nab., MD as Consulting Physician (Gastroenterology) Jillian Nash, DO as Consulting Physician (Pulmonary Disease) Jillian Boston, MD as Consulting Physician (General Surgery) 05/17/2022  CHIEF COMPLAINT: Follow-up rectal cancer  SUMMARY OF ONCOLOGIC HISTORY: Oncology History Overview Note  Cancer Staging Rectal adenocarcinoma [facility]) Staging form: Colon and Rectum, AJCC 8th Edition - Clinical stage from 12/21/2020: Stage IIIB (cT3, cN1, cM0) - Unsigned    Rectal adenocarcinoma ([place])  08/26/2020 Miscellaneous   Initial presentation to PCP, reporting intermittent BRBPR since 02/2020    12/01/2020 Procedure   Colonoscopy by Jillian Lutz findings - The perianal and digital rectal examinations were normal. - A 15 mm polyp was found in the ascending colon. The polyp was semi-pedunculated. Resection and retrieval were complete. - A 25 mm polyp was found in the sigmoid colon. The polyp was pedunculated. Resection and retrieval were complete. - An infiltrative partially obstructing large mass was found in the proximal rectum. The mass was partially circumferential (involving one-half of the lumen circumference). The mass measured eight cm in length extending from 10 to 18cm from anal verge. This was biopsied with a cold forceps for histology. Proximal and distal opposite fold area of the mass lesion was tattooed with an injection of total 3 mL of Spot (carbon black).   12/01/2020 Initial Biopsy   Diagnosis 1. Colon, polyp(s), ascending x 1 - ADENOCARCINOMA  ARISING IN A TUBULAR ADENOMA WITH HIGH-GRADE DYSPLASIA. SEE NOTE 2. Colon, sigmoid polyp, x1 - TUBULOVILLOUS ADENOMA(S) - NEGATIVE FOR HIGH-GRADE DYSPLASIA OR MALIGNANCY 3. Rectum, biopsy - ADENOCARCINOMA. SEE NOTE   12/01/2020 Cancer Staging   Cancer Staging Rectal adenocarcinoma [facility]) Staging form: Colon and Rectum, AJCC 8th Edition - Clinical stage from 12/21/2020: Stage IIIB (cT3, cN1, cM0) - Unsigned    12/06/2020 Imaging   CT CAP with contrast IMPRESSION: 1. There is partially circumferential soft tissue thickening of the mid to superior rectum, approximately 5 cm in length and the inferior extent approximately 6 cm above the anal verge, consistent with primary rectal malignancy identified by colonoscopy. 2. There appear to be abnormally enlarged perirectal lymph nodes posteriorly about the superior rectum measuring up to 1.0 x 0.8 cm. Findings are suspicious for perirectal nodal metastatic disease, however rectal MRI is the test of choice for initial local staging of rectal cancer. 3. There is a 4 mm nonspecific pulmonary nodule of the superior segment left lower lobe, statistically most likely incidental, infectious or inflammatory, although nonspecific and isolated metastatic disease is not strictly excluded. Attention on follow-up. 4. No other evidence of metastatic disease in the chest, abdomen, or pelvis. Aortic Atherosclerosis (ICD10-I70.0).   12/09/2020 Imaging   Local staging MRI pelvis without contrast IMPRESSION: 4.9 cm circumferential mid/lower rectal mass, corresponding to the patient's newly diagnosed rectal cancer. Rectal adenocarcinoma T stage: T3c Rectal adenocarcinoma N stage:  N1 Distance from tumor to the internal anal sphincter is 3.7 cm.   12/21/2020 Initial Diagnosis   Rectal adenocarcinoma ([place])   01/04/2021 -  Chemotherapy   Concurrent chemoradiation with Xeloda '1000mg'$  in the AM and '1500mg'$  in the PM on days of Radiation starting 01/04/21.  01/04/2021 - 02/11/2021 Radiation Therapy   Concurrent chemoradiation with Dr Jillian Lutz and Xeloda starting 01/04/21.    01/31/2022 Procedure   Colonoscopy, Jillian Lutz  Findings: - An infiltrative partially obstructing large mass was found in the rectum. The mass was circumferential. The mass measured ten cm in length, extending from 5-15cm from anal verge. In addition, rectal diameter at narrowest approx twelve mm. Oozing was present. Biopsies were taken with a cold forceps for histology.  Impression: - Hemorrhoids found on perianal exam. - Likely malignant partially obstructing tumor in the rectum. Biopsied. - Non-bleeding external and internal hemorrhoids.   01/31/2022 Pathology Results   Diagnosis Rectum, biopsy INVASIVE MODERATELY DIFFERENTIATED ADENOCARCINOMA   05/11/2022 -  Chemotherapy   Patient is on Treatment Plan : COLORECTAL Xelox (Capeox)(130/850) q21d       CURRENT THERAPY:  1. Xeloda, q21d, starting 02/16/22; dose: '1500mg'$  BID days 1-14; now 1000 mg AM/1500 mg PM with C1 CapeOx. 2.  Oxaliplatin started 05/11/2022, q21 days -- dose reduced for cycle 1   INTERVAL HISTORY: Jillian Lutz presents with an interpreter for toxicity check. Currently C1D7 CapeOx starting 10/26, she brings her pill box and has been taking Xeloda appropriately. She was constipated for 5 days and did not eat much.  She took milk of magnesia as instructed and had a cramping/painful BM after that and is normal now.  Denies blood in her stool.  She has some burning rectal pain for which she takes gabapentin.  Had 1 episode of nausea, relieved with Compazine.  She was able to eat and drink well after that.  Energy level is stable.  She has dry hands that she attributes to washing with hard water.  She had no issues with cold sensitivity or neuropathy.  Denies fever, chills, cough, chest pain, dyspnea, or other new complaints.  All other systems were reviewed with the patient and are negative.  MEDICAL HISTORY:   Past Medical History:  Diagnosis Date   Bilateral wrist pain 10/25/2017   Hypertension    Neck mass 1998   Unknown biopsy results. Excised in [place].   Pre-diabetes    Rectal cancer ([place])    Trigger finger, acquired 02/08/2017    SURGICAL HISTORY: Past Surgical History:  Procedure Laterality Date   BRONCHIAL BIOPSY  12/13/2021   Procedure: BRONCHIAL BIOPSIES;  Surgeon: Jillian Nash, DO;  Location: [place] [facility];  Service: Pulmonary;;   BRONCHIAL NEEDLE ASPIRATION BIOPSY  12/13/2021   Procedure: BRONCHIAL NEEDLE ASPIRATION BIOPSIES;  Surgeon: Jillian Nash, DO;  Location: [facility];  Service: Pulmonary;;   NECK SURGERY Left 1998   Excision of mass in [place]   VIDEO BRONCHOSCOPY WITH RADIAL ENDOBRONCHIAL ULTRASOUND  12/13/2021   Procedure: RADIAL ENDOBRONCHIAL ULTRASOUND;  Surgeon: Jillian Nash, DO;  Location: [place] ENDOSCOPY;  Service: Pulmonary;;    I have reviewed the social history and family history with the patient and they are unchanged from previous note.  ALLERGIES:  is allergic to no known allergies.  MEDICATIONS:  Current Outpatient Medications  Medication Sig Dispense Refill   amLODipine (NORVASC) 5 MG tablet Take 1 tablet (5 mg total) by mouth daily. 90 tablet 3   capecitabine (XELODA) 500 MG tablet Take 3 tablets (1,500 mg total) by mouth 2 (two) times daily after a meal. Take for 14 days on, 7 days off. Repeat every 21 days. 84 tablet 1   gabapentin (NEURONTIN) 100 MG capsule Take 1 capsule (100 mg total) by mouth 3 (three) times daily as needed. [place]  capsule 2   lisinopril-hydrochlorothiazide (ZESTORETIC) 20-12.5 MG tablet Take 2 tablets by mouth daily. 60 tablet 3   Multiple Vitamins-Minerals (MULTIVITAMIN WITH MINERALS) tablet Take 1 tablet by mouth daily. Gummy 50 mg     prochlorperazine (COMPAZINE) 10 MG tablet Take 1 tablet (10 mg total) by mouth every 6 (six) hours as needed. 30 tablet 2   No current facility-administered medications for this visit.     PHYSICAL EXAMINATION: ECOG PERFORMANCE STATUS: 1 - Symptomatic but completely ambulatory  Vitals:   05/17/22 0925  BP: 127/82  Pulse: 78  Resp: 15  Temp: 97.8 F (36.6 C)  SpO2: 98%   Filed Weights   05/17/22 0925  Weight: 124 lb 11.2 oz (56.6 kg)    GENERAL:alert, no distress and comfortable SKIN: Palms are dry without erythema or cracks EYES: sclera clear LUNGS:  normal breathing effort NEURO: alert & oriented x 3 with fluent speech   LABORATORY DATA:  I have reviewed the data as listed    Latest Ref Rng & Units 05/17/2022    9:05 AM 05/11/2022   10:09 AM 05/03/2022    8:25 AM  CBC  WBC 4.0 - 10.5 K/uL 6.0  6.1  6.6   Hemoglobin 12.0 - 15.0 g/dL 12.2  11.7  11.2   Hematocrit 36.0 - 46.0 % 37.1  34.9  34.3   Platelets 150 - 400 K/uL 193  209  256         Latest Ref Rng & Units 05/17/2022    9:05 AM 05/11/2022   10:09 AM 05/03/2022    8:25 AM  CMP  Glucose 70 - 99 mg/dL 122  220  174   BUN 8 - 23 mg/dL '13  18  15   '$ Creatinine 0.44 - 1.00 mg/dL 0.82  0.77  0.77   Sodium 135 - 145 mmol/L 135  139  140   Potassium 3.5 - 5.1 mmol/L 3.8  3.5  3.7   Chloride 98 - 111 mmol/L 98  104  107   CO2 22 - 32 mmol/L 34  29  28   Calcium 8.9 - 10.3 mg/dL 8.9  8.6  8.6   Total Protein 6.5 - 8.1 g/dL 7.1  6.4  6.4   Total Bilirubin 0.3 - 1.2 mg/dL 0.5  0.3  0.3   Alkaline Phos 38 - 126 U/L 58  55  52   AST 15 - 41 U/L '17  15  13   '$ ALT 0 - 44 U/L '7  7  6       '$ RADIOGRAPHIC STUDIES: I have personally reviewed the radiological images as listed and agreed with the findings in the report. No results found.   ASSESSMENT & PLAN: Jillian Lutz is a 71 y.o. female with    1.  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  Colonoscopy 12/01/2020 by Jillian Lutz showed 2 polyps in the ascending and sigmoid colon, and a large  partially obstructing mass in the proximal rectum, 10-18 cm from anal verge. Ascending colon polyp and rectal biopsies both showed adenocarcinoma. Per the report, the ascending colon biopsy was completely retrieved and resected. -Baseline CEA elevated to 19.95 -Staging CT CAP 12/06/2020 showed perirectal lymph node involvement and a single nonspecific left pulmonary nodule.  -Despite strong recommendation she firmly and repeatedly declined IV chemo and surgery.  She agreed to chemo RT with  Xeloda which she completed from 01/04/2021 - 02/14/2021 and tolerated well -She declined surveillance recommendations but developed pencil thin and decreased stool caliber, CEA which had normalized after chemo RT increased back to 18.4 on 09/28/2021.  CT showed enlargement of LLL nodule which was hypermetabolic on PET scan 7/86/7672 as well as marked hypermetabolism corresponding to residual rectal wall thickening.  No other evidence of hypermetabolic metastasis -S/p bronchoscopy by Dr. I called 12/13/2021 cytology negative for malignancy, a repeat biopsy was recommended but she declined -CEA continues rising up to 31 on 12/16/2021 -Surveillance colonoscopy 01/31/2022 by Jillian Lutz showed 10 cm partially obstructing rectal mass at 5-15 cm from anal verge biopsy confirmed invasive moderately differentiated adenocarcinoma -She agreed to restart Xeloda 02/16/2022; restaging CT 04/17/2022 showed persistent rectal wall thickening and increased size of LLL pulmonary nodule without other new or metastatic disease -After discussion with Congregational nurse we found she had not started Xeloda in August and the progression was not related to treatment failure -She began CapeOx 05/11/2022 with oxaliplatin on day 1 (dose reduced with cycle 1) and Xeloda 1000 mg a.m./1500 mg p.m. on days 1-14 q. 21 days -Ms. Fischbach appears stable on C1 D7, she had dry hands, constipation and low p.o. intake for 5 days that eventually resolved with milk of  magnesia, and 1 episode of nausea. Side effects are well managed with supportive care at home -She is doing well.  Labs reviewed. -She will continue Xeloda to complete 14 days, then 1 week off. -Follow-up 11/16 to start cycle 2 as scheduled.  We will plan to build the next pillbox at that time.  2. Symptom Management: constipation, low po intake, nausea, dry hands -rectal bleeding and pain resolved after initial treatment with chemoRT, completed 02/07/21 -other SE's related to CapeOx -I encouraged her to start milk of mag on day 1 or 2 after oxaliplatin, not to wait so long, to keep BMs consistent and avoid painful bowel movement.  - Hands are dry from dishwashing and Xeloda, I strongly encouraged her to wear gloves and use heavy lotion.    3. Social support -She is here with her husband, moved from [place] in 2014.  Her children remain in [place], spanning ages 2-50.  Her son tried to come during her treatment but was denied by [place] -She does not have a driver's license, she was previously referred to social work for transportation and financial support -Previously reviewed her children's risk for CRC and the need to begin screening by at least age 61.  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  -She has Anice Paganini that help with legal matters. She has help from Congressional nurse, Jake Michaelis who is her first point of contact -Her interpretor Calea Hribar is her second point of contact.    4.  HTN, DM, pre-existing neuropathy -She believes her neuropathy to be related to her job, working long hours on her feet -She has some burning rectal pain from residual cancer, she can use gabapentin  PLAN: -Labs reviewed -Continue Xeloda 1000 mg AM/1500 mg PM for the next week (to complete 14 days), then off 7 days -Symptom management for constipation and dry  hands -f/up 11/16 to start cycle 2 as previously scheduled, will fill pill box then   All questions were answered. The patient knows to call the clinic with any problems, questions or concerns.  No barriers to learning was detected. I spent 20 minutes counseling the patient face to face. The total time spent in the appointment was 30 minutes and more than 50% was on counseling and review of test results and coordination of care.      Jillian Feeling, NP 05/17/22

## 2022-05-17 ENCOUNTER — Encounter: Payer: Self-pay | Admitting: Nurse Practitioner

## 2022-05-17 ENCOUNTER — Inpatient Hospital Stay: Payer: Medicaid Other | Attending: Physician Assistant | Admitting: Nurse Practitioner

## 2022-05-17 ENCOUNTER — Other Ambulatory Visit: Payer: Self-pay

## 2022-05-17 ENCOUNTER — Inpatient Hospital Stay: Payer: Medicaid Other

## 2022-05-17 VITALS — BP 127/82 | HR 78 | Temp 97.8°F | Resp 15 | Wt 124.7 lb

## 2022-05-17 DIAGNOSIS — C182 Malignant neoplasm of ascending colon: Secondary | ICD-10-CM | POA: Insufficient documentation

## 2022-05-17 DIAGNOSIS — K5900 Constipation, unspecified: Secondary | ICD-10-CM | POA: Diagnosis not present

## 2022-05-17 DIAGNOSIS — R911 Solitary pulmonary nodule: Secondary | ICD-10-CM | POA: Insufficient documentation

## 2022-05-17 DIAGNOSIS — R110 Nausea: Secondary | ICD-10-CM | POA: Diagnosis not present

## 2022-05-17 DIAGNOSIS — C19 Malignant neoplasm of rectosigmoid junction: Secondary | ICD-10-CM | POA: Diagnosis not present

## 2022-05-17 DIAGNOSIS — C20 Malignant neoplasm of rectum: Secondary | ICD-10-CM

## 2022-05-17 DIAGNOSIS — I10 Essential (primary) hypertension: Secondary | ICD-10-CM | POA: Insufficient documentation

## 2022-05-17 DIAGNOSIS — Z5111 Encounter for antineoplastic chemotherapy: Secondary | ICD-10-CM | POA: Insufficient documentation

## 2022-05-17 DIAGNOSIS — E1140 Type 2 diabetes mellitus with diabetic neuropathy, unspecified: Secondary | ICD-10-CM | POA: Insufficient documentation

## 2022-05-17 LAB — CBC WITH DIFFERENTIAL (CANCER CENTER ONLY)
Abs Immature Granulocytes: 0.03 10*3/uL (ref 0.00–0.07)
Basophils Absolute: 0 10*3/uL (ref 0.0–0.1)
Basophils Relative: 1 %
Eosinophils Absolute: 0.2 10*3/uL (ref 0.0–0.5)
Eosinophils Relative: 3 %
HCT: 37.1 % (ref 36.0–46.0)
Hemoglobin: 12.2 g/dL (ref 12.0–15.0)
Immature Granulocytes: 1 %
Lymphocytes Relative: 14 %
Lymphs Abs: 0.8 10*3/uL (ref 0.7–4.0)
MCH: 26.6 pg (ref 26.0–34.0)
MCHC: 32.9 g/dL (ref 30.0–36.0)
MCV: 81 fL (ref 80.0–100.0)
Monocytes Absolute: 0.6 10*3/uL (ref 0.1–1.0)
Monocytes Relative: 10 %
Neutro Abs: 4.4 10*3/uL (ref 1.7–7.7)
Neutrophils Relative %: 71 %
Platelet Count: 193 10*3/uL (ref 150–400)
RBC: 4.58 MIL/uL (ref 3.87–5.11)
RDW: 15.9 % — ABNORMAL HIGH (ref 11.5–15.5)
WBC Count: 6 10*3/uL (ref 4.0–10.5)
nRBC: 0 % (ref 0.0–0.2)

## 2022-05-17 LAB — CMP (CANCER CENTER ONLY)
ALT: 7 U/L (ref 0–44)
AST: 17 U/L (ref 15–41)
Albumin: 3.7 g/dL (ref 3.5–5.0)
Alkaline Phosphatase: 58 U/L (ref 38–126)
Anion gap: 3 — ABNORMAL LOW (ref 5–15)
BUN: 13 mg/dL (ref 8–23)
CO2: 34 mmol/L — ABNORMAL HIGH (ref 22–32)
Calcium: 8.9 mg/dL (ref 8.9–10.3)
Chloride: 98 mmol/L (ref 98–111)
Creatinine: 0.82 mg/dL (ref 0.44–1.00)
GFR, Estimated: >60 mL/min (ref >60)
Glucose, Bld: 122 mg/dL — ABNORMAL HIGH (ref 70–99)
Potassium: 3.8 mmol/L (ref 3.5–5.1)
Sodium: 135 mmol/L (ref 135–145)
Total Bilirubin: 0.5 mg/dL (ref 0.3–1.2)
Total Protein: 7.1 g/dL (ref 6.5–8.1)

## 2022-05-23 ENCOUNTER — Other Ambulatory Visit (HOSPITAL_COMMUNITY): Payer: Self-pay

## 2022-05-23 ENCOUNTER — Telehealth: Payer: Self-pay | Admitting: Pharmacist

## 2022-05-23 ENCOUNTER — Other Ambulatory Visit: Payer: Self-pay

## 2022-05-23 DIAGNOSIS — C20 Malignant neoplasm of rectum: Secondary | ICD-10-CM

## 2022-05-23 MED ORDER — CAPECITABINE 500 MG PO TABS
1000.0000 mg/m2 | ORAL_TABLET | Freq: Two times a day (BID) | ORAL | 0 refills | Status: DC
  Filled 2022-05-23: qty 84, 14d supply, fill #0

## 2022-05-23 MED ORDER — CAPECITABINE 500 MG PO TABS
ORAL_TABLET | ORAL | 0 refills | Status: DC
  Filled 2022-05-23: qty 70, 14d supply, fill #0
  Filled 2022-05-23: qty 70, fill #0

## 2022-05-23 NOTE — Telephone Encounter (Addendum)
Oral Chemotherapy Pharmacist Encounter     Received call from Jillian Michaelis, RN patient's congregational nurse, that Jillian Lutz now has Medicaid. Due to difficulties with scheduling Xeloda through [facility] each month, we will go ahead and transfer patient's remaining refill from previous script to [facility] for dispensing. Prescription directions and quantity updated to reflect most recent dosing of 2 tabs AM/3 tabs PM for 14 days on, 7 off, repeated every 21 days.   Leron Croak, PharmD, BCPS, [facility] Hematology/Oncology Clinical Pharmacist [facility] and [place] [phone] 05/23/2022 11:51 AM

## 2022-05-24 ENCOUNTER — Telehealth: Payer: Self-pay

## 2022-05-24 ENCOUNTER — Other Ambulatory Visit (HOSPITAL_COMMUNITY): Payer: Self-pay

## 2022-05-24 NOTE — Telephone Encounter (Signed)
Phone call to patient with interpreter [name] assisting.  Reminded patient of 11/16 appointment at [facility].  Told her that Medicaid transportation has been requested and she should be ready at 8:45 am.  Reminded her to take pill box and new bottle of Xeloda to appointment.  She will take the am dose of Xeloda before going to appointment.  (CN will pick it up tomorrow and take to patient).  She also requested another bottle of Milk of Magnesia.  Jake Michaelis RN, Congregational Nurse [phone]

## 2022-05-25 ENCOUNTER — Inpatient Hospital Stay: Payer: Medicaid Other

## 2022-05-25 ENCOUNTER — Inpatient Hospital Stay: Payer: Medicaid Other | Admitting: Hematology

## 2022-05-25 NOTE — Congregational Nurse Program (Signed)
Home visit to deliver capecitabine which was picked up from [facility].  Patient instructed not to start it until 06/01/2022.  She will take 2 tablets that morning prior to her appointment at the [facility]. She will take pill boxes and medicine bottle to appointment for meeting with pharmacist to go over instructions again. Patient had purchased milk of magnesia but not taken yet.  Jake Michaelis RN, Congregational Nurse [phone]

## 2022-05-31 ENCOUNTER — Telehealth: Payer: Self-pay

## 2022-05-31 MED FILL — Dexamethasone Sodium Phosphate Inj 100 MG/10ML: INTRAMUSCULAR | Qty: 1 | Status: AC

## 2022-05-31 NOTE — Telephone Encounter (Signed)
Phone call to patient with interpreter [name] assisting.  Reminded patient that Medicaid transportation will be picking her up for tomorrow's appointment at the [facility] and that she needs to be ready at 8:45 am.  Also reminded her to bring pill boxes and Xeloda to appointment and that she should take all medications before she comes.  Jake Michaelis RN, Congregational Nurse [phone]

## 2022-06-01 ENCOUNTER — Inpatient Hospital Stay (HOSPITAL_BASED_OUTPATIENT_CLINIC_OR_DEPARTMENT_OTHER): Payer: Medicaid Other | Admitting: Pharmacist

## 2022-06-01 ENCOUNTER — Inpatient Hospital Stay (HOSPITAL_BASED_OUTPATIENT_CLINIC_OR_DEPARTMENT_OTHER): Payer: Medicaid Other | Admitting: Hematology

## 2022-06-01 ENCOUNTER — Inpatient Hospital Stay: Payer: Medicaid Other

## 2022-06-01 ENCOUNTER — Other Ambulatory Visit: Payer: Self-pay

## 2022-06-01 ENCOUNTER — Encounter: Payer: Self-pay | Admitting: Hematology

## 2022-06-01 VITALS — BP 134/84 | HR 71 | Temp 98.2°F | Resp 18 | Ht 59.0 in | Wt 126.6 lb

## 2022-06-01 DIAGNOSIS — C20 Malignant neoplasm of rectum: Secondary | ICD-10-CM | POA: Diagnosis not present

## 2022-06-01 DIAGNOSIS — Z5111 Encounter for antineoplastic chemotherapy: Secondary | ICD-10-CM | POA: Diagnosis not present

## 2022-06-01 LAB — CBC WITH DIFFERENTIAL (CANCER CENTER ONLY)
Abs Immature Granulocytes: 0.02 10*3/uL (ref 0.00–0.07)
Basophils Absolute: 0.1 10*3/uL (ref 0.0–0.1)
Basophils Relative: 1 %
Eosinophils Absolute: 0 10*3/uL (ref 0.0–0.5)
Eosinophils Relative: 0 %
HCT: 35.2 % — ABNORMAL LOW (ref 36.0–46.0)
Hemoglobin: 11.8 g/dL — ABNORMAL LOW (ref 12.0–15.0)
Immature Granulocytes: 0 %
Lymphocytes Relative: 13 %
Lymphs Abs: 0.8 10*3/uL (ref 0.7–4.0)
MCH: 27.4 pg (ref 26.0–34.0)
MCHC: 33.5 g/dL (ref 30.0–36.0)
MCV: 81.9 fL (ref 80.0–100.0)
Monocytes Absolute: 0.8 10*3/uL (ref 0.1–1.0)
Monocytes Relative: 13 %
Neutro Abs: 4.2 10*3/uL (ref 1.7–7.7)
Neutrophils Relative %: 73 %
Platelet Count: 266 10*3/uL (ref 150–400)
RBC: 4.3 MIL/uL (ref 3.87–5.11)
RDW: 17 % — ABNORMAL HIGH (ref 11.5–15.5)
WBC Count: 5.8 10*3/uL (ref 4.0–10.5)
nRBC: 0 % (ref 0.0–0.2)

## 2022-06-01 LAB — CMP (CANCER CENTER ONLY)
ALT: 6 U/L (ref 0–44)
AST: 16 U/L (ref 15–41)
Albumin: 3.8 g/dL (ref 3.5–5.0)
Alkaline Phosphatase: 55 U/L (ref 38–126)
Anion gap: 6 (ref 5–15)
BUN: 15 mg/dL (ref 8–23)
CO2: 27 mmol/L (ref 22–32)
Calcium: 9.1 mg/dL (ref 8.9–10.3)
Chloride: 106 mmol/L (ref 98–111)
Creatinine: 0.67 mg/dL (ref 0.44–1.00)
GFR, Estimated: >60 mL/min (ref >60)
Glucose, Bld: 111 mg/dL — ABNORMAL HIGH (ref 70–99)
Potassium: 3.7 mmol/L (ref 3.5–5.1)
Sodium: 139 mmol/L (ref 135–145)
Total Bilirubin: 0.4 mg/dL (ref 0.3–1.2)
Total Protein: 7.2 g/dL (ref 6.5–8.1)

## 2022-06-01 MED ORDER — SODIUM CHLORIDE 0.9 % IV SOLN
10.0000 mg | Freq: Once | INTRAVENOUS | Status: AC
  Administered 2022-06-01: 10 mg via INTRAVENOUS
  Filled 2022-06-01: qty 10

## 2022-06-01 MED ORDER — OXALIPLATIN CHEMO INJECTION 100 MG/20ML
110.0000 mg/m2 | Freq: Once | INTRAVENOUS | Status: AC
  Administered 2022-06-01: 170 mg via INTRAVENOUS
  Filled 2022-06-01: qty 34

## 2022-06-01 MED ORDER — PALONOSETRON HCL INJECTION 0.25 MG/5ML
0.2500 mg | Freq: Once | INTRAVENOUS | Status: AC
  Administered 2022-06-01: 0.25 mg via INTRAVENOUS
  Filled 2022-06-01: qty 5

## 2022-06-01 MED ORDER — DEXTROSE 5 % IV SOLN: Freq: Once | INTRAVENOUS | Status: AC

## 2022-06-01 NOTE — Progress Notes (Signed)
Oral Chemotherapy Clinic [facility]  [phone] [phone]  Patient Care Team: Gaylan Gerold, DO as PCP - General (Internal Medicine) Jonnie Finner, RN (Inactive) as Oncology Nurse Navigator Alla Feeling, NP as Nurse Practitioner (Nurse Practitioner) [name], MD as Consulting Physician (Hematology and Oncology) Kyung Rudd, MD as Consulting Physician (Radiation Oncology) [name], Telford Nab., MD as Consulting Physician (Gastroenterology) Garner Nash, DO as Consulting Physician (Pulmonary Disease) Michael Boston, MD as Consulting Physician (General Surgery)   Name of the patient: Jillian Lutz  [id number]  10-Jun-1951   Date of visit: 06/01/22  HPI: Patient is a 71 y.o. female with rectal cancer presenting for 2nd cycle of CapeOx  Reason for Consult: Oral chemotherapy follow-up for Xeloda (capecitabine) therapy medication adherence/pill box fill.    PAST MEDICAL HISTORY: Past Medical History:  Diagnosis Date   Bilateral wrist pain 10/25/2017   Hypertension    Neck mass 1998   Unknown biopsy results. Excised in [place].   Pre-diabetes    Rectal cancer ([place])    Trigger finger, acquired 02/08/2017    HEMATOLOGY/ONCOLOGY HISTORY:  Oncology History Overview Note  Cancer Staging Rectal adenocarcinoma [facility]) Staging form: Colon and Rectum, AJCC 8th Edition - Clinical stage from 12/21/2020: Stage IIIB (cT3, cN1, cM0) - Unsigned    Rectal adenocarcinoma ([place])  08/26/2020 Miscellaneous   Initial presentation to PCP, reporting intermittent BRBPR since 02/2020    12/01/2020 Procedure   Colonoscopy by Dr. Silverio Decamp findings - The perianal and digital rectal examinations were normal. - A 15 mm polyp was found in the ascending colon. The polyp was semi-pedunculated. Resection and retrieval were complete. - A 25 mm polyp was found in the sigmoid colon. The polyp was pedunculated. Resection and retrieval were complete. - An infiltrative  partially obstructing large mass was found in the proximal rectum. The mass was partially circumferential (involving one-half of the lumen circumference). The mass measured eight cm in length extending from 10 to 18cm from anal verge. This was biopsied with a cold forceps for histology. Proximal and distal opposite fold area of the mass lesion was tattooed with an injection of total 3 mL of Spot (carbon black).   12/01/2020 Initial Biopsy   Diagnosis 1. Colon, polyp(s), ascending x 1 - ADENOCARCINOMA ARISING IN A TUBULAR ADENOMA WITH HIGH-GRADE DYSPLASIA. SEE NOTE 2. Colon, sigmoid polyp, x1 - TUBULOVILLOUS ADENOMA(S) - NEGATIVE FOR HIGH-GRADE DYSPLASIA OR MALIGNANCY 3. Rectum, biopsy - ADENOCARCINOMA. SEE NOTE   12/01/2020 Cancer Staging   Cancer Staging Rectal adenocarcinoma [facility]) Staging form: Colon and Rectum, AJCC 8th Edition - Clinical stage from 12/21/2020: Stage IIIB (cT3, cN1, cM0) - Unsigned    12/06/2020 Imaging   CT CAP with contrast IMPRESSION: 1. There is partially circumferential soft tissue thickening of the mid to superior rectum, approximately 5 cm in length and the inferior extent approximately 6 cm above the anal verge, consistent with primary rectal malignancy identified by colonoscopy. 2. There appear to be abnormally enlarged perirectal lymph nodes posteriorly about the superior rectum measuring up to 1.0 x 0.8 cm. Findings are suspicious for perirectal nodal metastatic disease, however rectal MRI is the test of choice for initial local staging of rectal cancer. 3. There is a 4 mm nonspecific pulmonary nodule of the superior segment left lower lobe, statistically most likely incidental, infectious or inflammatory, although nonspecific and isolated metastatic disease is not strictly excluded. Attention on follow-up. 4. No other evidence of metastatic disease in the chest, abdomen, or pelvis.  Aortic Atherosclerosis (ICD10-I70.0).   12/09/2020 Imaging   Local  staging MRI pelvis without contrast IMPRESSION: 4.9 cm circumferential mid/lower rectal mass, corresponding to the patient's newly diagnosed rectal cancer. Rectal adenocarcinoma T stage: T3c Rectal adenocarcinoma N stage:  N1 Distance from tumor to the internal anal sphincter is 3.7 cm.   12/21/2020 Initial Diagnosis   Rectal adenocarcinoma ([place])   01/04/2021 -  Chemotherapy   Concurrent chemoradiation with Xeloda '1000mg'$  in the AM and '1500mg'$  in the PM on days of Radiation starting 01/04/21.       01/04/2021 - 02/11/2021 Radiation Therapy   Concurrent chemoradiation with Dr Lisbeth Renshaw and Xeloda starting 01/04/21.    01/31/2022 Procedure   Colonoscopy, Dr. Silverio Decamp  Findings: - An infiltrative partially obstructing large mass was found in the rectum. The mass was circumferential. The mass measured ten cm in length, extending from 5-15cm from anal verge. In addition, rectal diameter at narrowest approx twelve mm. Oozing was present. Biopsies were taken with a cold forceps for histology.  Impression: - Hemorrhoids found on perianal exam. - Likely malignant partially obstructing tumor in the rectum. Biopsied. - Non-bleeding external and internal hemorrhoids.   01/31/2022 Pathology Results   Diagnosis Rectum, biopsy INVASIVE MODERATELY DIFFERENTIATED ADENOCARCINOMA   05/11/2022 -  Chemotherapy   Patient is on Treatment Plan : COLORECTAL Xelox (Capeox)(130/850) q21d       ALLERGIES:  is allergic to no known allergies.  MEDICATIONS:  Current Outpatient Medications  Medication Sig Dispense Refill   amLODipine (NORVASC) 5 MG tablet Take 1 tablet (5 mg total) by mouth daily. 90 tablet 3   capecitabine (XELODA) 500 MG tablet Take 2 tablets (1000 mg) by mouth in morning and 3 tablets (1500 mg) by mouth in evening. Take within 30 minutes after meals. Take for 14 days on, 7 days off. Repeat every 21 days. 70 tablet 0   gabapentin (NEURONTIN) 100 MG capsule Take 1 capsule (100 mg total) by mouth 3  (three) times daily as needed. 90 capsule 2   lisinopril-hydrochlorothiazide (ZESTORETIC) 20-12.5 MG tablet Take 2 tablets by mouth daily. 60 tablet 3   Multiple Vitamins-Minerals (MULTIVITAMIN WITH MINERALS) tablet Take 1 tablet by mouth daily. Gummy 50 mg     prochlorperazine (COMPAZINE) 10 MG tablet Take 1 tablet (10 mg total) by mouth every 6 (six) hours as needed. 30 tablet 2   No current facility-administered medications for this visit.    VITAL SIGNS: There were no vitals taken for this visit. There were no vitals filed for this visit.  Estimated body mass index is 25.57 kg/m as calculated from the following:   Height as of an earlier encounter on 06/01/22: '4\' 11"'$  (1.499 m).   Weight as of an earlier encounter on 06/01/22: 57.4 kg (126 lb 9.6 oz).  LABS: CBC:    Component Value Date/Time   WBC 5.8 06/01/2022 0956   WBC 6.2 12/13/2021 1115   HGB 11.8 (L) 06/01/2022 0956   HGB 11.4 08/26/2020 1108   HCT 35.2 (L) 06/01/2022 0956   HCT 36.4 08/26/2020 1108   PLT 266 06/01/2022 0956   PLT 247 08/26/2020 1108   MCV 81.9 06/01/2022 0956   MCV 81 08/26/2020 1108   NEUTROABS 4.2 06/01/2022 0956   NEUTROABS 4.1 08/26/2020 1108   LYMPHSABS 0.8 06/01/2022 0956   LYMPHSABS 1.4 08/26/2020 1108   MONOABS 0.8 06/01/2022 0956   EOSABS 0.0 06/01/2022 0956   EOSABS 0.2 08/26/2020 1108   BASOSABS 0.1 06/01/2022 0956   BASOSABS 0.1  08/26/2020 1108   Comprehensive Metabolic Panel:    Component Value Date/Time   NA 139 06/01/2022 0956   NA 139 09/29/2019 1406   K 3.7 06/01/2022 0956   CL 106 06/01/2022 0956   CO2 27 06/01/2022 0956   BUN 15 06/01/2022 0956   BUN 12 09/29/2019 1406   CREATININE 0.67 06/01/2022 0956   GLUCOSE 111 (H) 06/01/2022 0956   CALCIUM 9.1 06/01/2022 0956   AST 16 06/01/2022 0956   ALT 6 06/01/2022 0956   ALKPHOS 55 06/01/2022 0956   BILITOT 0.4 06/01/2022 0956   PROT 7.2 06/01/2022 0956   PROT 6.6 06/22/2016 1004   ALBUMIN 3.8 06/01/2022 0956    ALBUMIN 4.1 06/22/2016 1004    Present during today's visit: patient, Jake Michaelis, RN (Congregational Nurse) and interpreter present  Assessment and Plan: Patient brought in Xeloda pill box, which patient had successfully filled for her 2nd cycle of Xeloda prior to coming to clinic. I also double checked medication box to ensure correct number of pills were placed in the designated AM and PM sections of box.  Patient took AM dose of Xeloda on 06/01/22 prior to coming to clinic.  Per Dr. Burr Medico, patient will continue taking Xeloda (capecitabine) 500 mg tablets, 2 tablets (1000 mg) by mouth in AM and 3 tablets (1500 mg) by mouth in PM for 14 days on, 7 days off, repeated every 21 days for the upcoming cycle of Xeloda. Xeloda calendar for Cycle 2 of CapeOx provided to patient.    Oral Chemotherapy Adherence: Patient had 100% medication adherence with last cycle of Xeloda. No current patient barriers to medication adherence identified at this time. She is utilizing a labeled pill box for days 1-14 to help with maintaining her medication adherence.   New medications: None  Medication Access Issues: No access issues currently. Patient has recently obtained medicaid, and is now filling Xeloda through the [facility] for a copay of $4 for each fill. Patient's congregational nurse, Jake Michaelis, RN picks up the Xeloda for the patient from the [facility] during the patient's off week, and delivers it to patient's home.    Patient expressed understanding and was in agreement with this plan. She also understands that She can call clinic at any time with any questions, concerns, or complaints.   Follow-up plan: Oral chemotherapy clinic will be available as needed for additional assistance for medication adherence checks and pill box management.   Thank you for allowing me to participate in the care of this very pleasant patient.   Leron Croak, PharmD, BCPS,  [facility] Hematology/Oncology Clinical Pharmacist [facility] and [place] [phone] 06/01/2022 10:44 AM

## 2022-06-01 NOTE — Patient Instructions (Signed)
[facility] MEDICAL ONCOLOGY  Discharge Instructions: Thank you for choosing [facility] to provide your oncology and hematology care.   If you have a lab appointment with the Cancer Center, please go directly to the Cancer Center and check in at the registration area.   Wear comfortable clothing and clothing appropriate for easy access to any Portacath or PICC line.   We strive to give you quality time with your provider. You may need to reschedule your appointment if you arrive late (15 or more minutes).  Arriving late affects you and other patients whose appointments are after yours.  Also, if you miss three or more appointments without notifying the office, you may be dismissed from the clinic at the provider's discretion.      For prescription refill requests, have your pharmacy contact our office and allow 72 hours for refills to be completed.    Today you received the following chemotherapy and/or immunotherapy agents: Oxaliplatin      To help prevent nausea and vomiting after your treatment, we encourage you to take your nausea medication as directed.  BELOW ARE SYMPTOMS THAT SHOULD BE REPORTED IMMEDIATELY: *FEVER GREATER THAN 100.4 F (38 C) OR HIGHER *CHILLS OR SWEATING *NAUSEA AND VOMITING THAT IS NOT CONTROLLED WITH YOUR NAUSEA MEDICATION *UNUSUAL SHORTNESS OF BREATH *UNUSUAL BRUISING OR BLEEDING *URINARY PROBLEMS (pain or burning when urinating, or frequent urination) *BOWEL PROBLEMS (unusual diarrhea, constipation, pain near the anus) TENDERNESS IN MOUTH AND THROAT WITH OR WITHOUT PRESENCE OF ULCERS (sore throat, sores in mouth, or a toothache) UNUSUAL RASH, SWELLING OR PAIN  UNUSUAL VAGINAL DISCHARGE OR ITCHING   Items with * indicate a potential emergency and should be followed up as soon as possible or go to the Emergency Department if any problems should occur.  Please show the CHEMOTHERAPY ALERT CARD or IMMUNOTHERAPY ALERT CARD at check-in  to the Emergency Department and triage nurse.  Should you have questions after your visit or need to cancel or reschedule your appointment, please contact [facility]ICAL [facility]COLOGY  Dept: [phone]  and follow the prompts.  Office hours are 8:00 a.m. to 4:30 p.m. Monday - Friday. Please note that voicemails left after 4:00 p.m. may not be returned until the following business day.  We are closed weekends and major holidays. You have access to a nurse at all times for urgent questions. Please call the main number to the clinic Dept: [phone] and follow the prompts.   For any non-urgent questions, you may also contact your provider using MyChart. We now offer e-Visits for anyone 18 and older to request care online for non-urgent symptoms. For details visit [email or web address].   Also download the MyChart app! Go to the app store, search "MyChart", open the app, select [facility], and log in with your MyChart username and password.  Masks are optional in the cancer centers. If you would like for your care team to wear a mask while they are taking care of you, please let them know. You may have one support Vincenzo Stave who is at least 71 years old accompany you for your appointments. Oxaliplatin Injection What is this medication? OXALIPLATIN (ox AL i PLA tin) treats some types of cancer. It works by slowing down the growth of cancer cells. This medicine may be used for other purposes; ask your health care provider or pharmacist if you have questions. COMMON BRAND NAME(S): Eloxatin What should I tell my care team before I take this medication?   They need to know if you have any of these conditions: Heart disease History of irregular heartbeat or rhythm Liver disease Low blood cell levels (white cells, red cells, and platelets) Lung or breathing disease, such as asthma Take medications that treat or prevent blood clots Tingling of the fingers, toes, or other nerve disorder An  unusual or allergic reaction to oxaliplatin, other medications, foods, dyes, or preservatives If you or your partner are pregnant or trying to get pregnant Breast-feeding How should I use this medication? This medication is injected into a vein. It is given by your care team in a hospital or clinic setting. Talk to your care team about the use of this medication in children. Special care may be needed. Overdosage: If you think you have taken too much of this medicine contact a poison control center or emergency room at once. NOTE: This medicine is only for you. Do not share this medicine with others. What if I miss a dose? Keep appointments for follow-up doses. It is important not to miss a dose. Call your care team if you are unable to keep an appointment. What may interact with this medication? Do not take this medication with any of the following: Cisapride Dronedarone Pimozide Thioridazine This medication may also interact with the following: Aspirin and aspirin-like medications Certain medications that treat or prevent blood clots, such as warfarin, apixaban, dabigatran, and rivaroxaban Cisplatin Cyclosporine Diuretics Medications for infection, such as acyclovir, adefovir, amphotericin B, bacitracin, cidofovir, foscarnet, ganciclovir, gentamicin, pentamidine, vancomycin NSAIDs, medications for pain and inflammation, such as ibuprofen or naproxen Other medications that cause heart rhythm changes Pamidronate Zoledronic acid This list may not describe all possible interactions. Give your health care provider a list of all the medicines, herbs, non-prescription drugs, or dietary supplements you use. Also tell them if you smoke, drink alcohol, or use illegal drugs. Some items may interact with your medicine. What should I watch for while using this medication? Your condition will be monitored carefully while you are receiving this medication. You may need blood work while taking this  medication. This medication may make you feel generally unwell. This is not uncommon as chemotherapy can affect healthy cells as well as cancer cells. Report any side effects. Continue your course of treatment even though you feel ill unless your care team tells you to stop. This medication may increase your risk of getting an infection. Call your care team for advice if you get a fever, chills, sore throat, or other symptoms of a cold or flu. Do not treat yourself. Try to avoid being around people who are sick. Avoid taking medications that contain aspirin, acetaminophen, ibuprofen, naproxen, or ketoprofen unless instructed by your care team. These medications may hide a fever. Be careful brushing or flossing your teeth or using a toothpick because you may get an infection or bleed more easily. If you have any dental work done, tell your dentist you are receiving this medication. This medication can make you more sensitive to cold. Do not drink cold drinks or use ice. Cover exposed skin before coming in contact with cold temperatures or cold objects. When out in cold weather wear warm clothing and cover your mouth and nose to warm the air that goes into your lungs. Tell your care team if you get sensitive to the cold. Talk to your care team if you or your partner are pregnant or think either of you might be pregnant. This medication can cause serious birth defects if taken during   pregnancy and for 9 months after the last dose. A negative pregnancy test is required before starting this medication. A reliable form of contraception is recommended while taking this medication and for 9 months after the last dose. Talk to your care team about effective forms of contraception. Do not father a child while taking this medication and for 6 months after the last dose. Use a condom while having sex during this time period. Do not breastfeed while taking this medication and for 3 months after the last dose. This  medication may cause infertility. Talk to your care team if you are concerned about your fertility. What side effects may I notice from receiving this medication? Side effects that you should report to your care team as soon as possible: Allergic reactions--skin rash, itching, hives, swelling of the face, lips, tongue, or throat Bleeding--bloody or black, tar-like stools, vomiting blood or brown material that looks like coffee grounds, red or dark brown urine, small red or purple spots on skin, unusual bruising or bleeding Dry cough, shortness of breath or trouble breathing Heart rhythm changes--fast or irregular heartbeat, dizziness, feeling faint or lightheaded, chest pain, trouble breathing Infection--fever, chills, cough, sore throat, wounds that don't heal, pain or trouble when passing urine, general feeling of discomfort or being unwell Liver injury--right upper belly pain, loss of appetite, nausea, light-colored stool, dark yellow or brown urine, yellowing skin or eyes, unusual weakness or fatigue Low red blood cell level--unusual weakness or fatigue, dizziness, headache, trouble breathing Muscle injury--unusual weakness or fatigue, muscle pain, dark yellow or brown urine, decrease in amount of urine Pain, tingling, or numbness in the hands or feet Sudden and severe headache, confusion, change in vision, seizures, which may be signs of posterior reversible encephalopathy syndrome (PRES) Unusual bruising or bleeding Side effects that usually do not require medical attention (report to your care team if they continue or are bothersome): Diarrhea Nausea Pain, redness, or swelling with sores inside the mouth or throat Unusual weakness or fatigue Vomiting This list may not describe all possible side effects. Call your doctor for medical advice about side effects. You may report side effects to FDA at 1-800-FDA-1088. Where should I keep my medication? This medication is given in a hospital or  clinic. It will not be stored at home. NOTE: This sheet is a summary. It may not cover all possible information. If you have questions about this medicine, talk to your doctor, pharmacist, or health care provider.  2023 Elsevier/Gold Standard (2007-08-24 00:00:00)  

## 2022-06-01 NOTE — Congregational Nurse Program (Signed)
Accompanied patient to appointment with Dr. Burr Medico at [facility].  Reminded her of follow-up appointment with PCP Dr. Alfonse Spruce on 06/06/2022 @ 1:15 pm.  She is complaining of right sided buttock and leg pain when constipated.  Advised her to tell Dr. Alfonse Spruce.  Dr. Burr Medico told her to continue Miralax daily to soften stool and Milk of Magnesia 1-2 days after IV chemo for constipation.  Patient states she has both medicines.  CN called to arrange Medicaid transportation for 06/06/2022 appointment.  Jake Michaelis RN, Congregational Nurse [phone]

## 2022-06-01 NOTE — Progress Notes (Signed)
[place]   Telephone:([phone]) [phone] Fax:(336) [phone]   Clinic Follow up Note   Patient Care Team: Gaylan Gerold, DO as PCP - General (Internal Medicine) Jonnie Finner, RN (Inactive) as Oncology Nurse Navigator Alla Feeling, NP as Nurse Practitioner (Nurse Practitioner) [name], MD as Consulting Physician (Hematology and Oncology) Kyung Rudd, MD as Consulting Physician (Radiation Oncology) [name], Telford Nab., MD as Consulting Physician (Gastroenterology) Garner Nash, DO as Consulting Physician (Pulmonary Disease) Michael Boston, MD as Consulting Physician (General Surgery)  Date of Service:  06/01/2022  CHIEF COMPLAINT: f/u of rectal cancer  CURRENT THERAPY:  CAPEOX, q21d, starting 05/11/22  -Xeloda dose: 1093m AM, 15030mPM, days 1-14  ASSESSMENT & PLAN:  Jillian Lutz a [age].o. female with   1.  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 -Staging CT CAP on 12/06/20 showed: perirectal lymph node involvement; single nonspecific left pulmonary nodule.  -Despite strong recommendation, patient firmly and repeatedly declined IV chemo and surgery. She agreed to chemoRT with Xeloda, 01/04/21 - 02/14/21. -she previously declined surveillance measures, however, she developed pencil-thin and decreased caliber of stool. Her CEA normalized after chemoRT but increased to 18.49 on 09/28/21. She agreed to work up, and CT AP on 10/07/21 showed enlargement of LLL nodule.  -Further work up with PET 11/24/21, showed: hypermetabolic LLL nodule; marked hypermetabolism corresponding to residual rectal wall thickening; no other evidence of hypermetabolic metastasis.  -bronchoscopy 12/13/21 by Dr. IcValeta Harmscytology negative for malignancy. Repeat biopsy was recommended, but she  declined. -her most recent CEA on 12/16/21 was 31.56. -local recurrence confirmed by surveillance colonoscopy 01/2022. -she agreed to restart Xeloda and started on 02/16/22. Her third cycle was delayed due to Xeloda not being delivered. She reports improvement in her abdominal pain, indicating clinical improvement. -restaging CT CAP on 04/17/22 showed: persistent rectal thickening extending 9.7 cm proximal to anorectal junction; no definite lymphadenopathy; slight increased size of superior LLL pulmonary nodule, currently 1.5 cm; no new pulmonary nodules or other metastatic disease.   -low dose oxaliplatin was added on 05/11/22 (with cycle 4 of Xeloda). She is tolerating well overall. -lab reviewed, hgb overall stable at 11.8   2. Constipation -secondary to #1 -she is using milk of magnesia. I encouraged her to use miralax daily and milk of magnesia as needed.   3. Social support -She has orange card. She has been approved for free drug replacement through [facility], [place]-her husband has Alzheimer's, and she is his caregiver.  -She has LaResearch officer, trade unionnd CoOptometrist     PLAN -proceed with second oxaliplatin today, will increase dose to 11055m2  -continue Xeloda, at same dose, she started this morning, we did check her pill box  -lab, f/u, and oxaliplatin in 3 and 6 weeks  -they prefer Thursday appointments so nurse CarHoyle Sauern attend   No problem-specific Assessment & Plan notes found for this encounter.   SUMMARY OF ONCOLOGIC HISTORY: Oncology History Overview Note  Cancer Staging Rectal adenocarcinoma ([facility] form: Colon and Rectum, AJCC 8th Edition - Clinical stage from 12/21/2020: Stage IIIB (cT3, cN1, cM0) - Unsigned    Rectal adenocarcinoma ([place]/04/2021 Miscellaneous   Initial presentation to PCP, reporting intermittent BRBPR since 02/2020    12/01/2020 Procedure   Colonoscopy by Dr. NanSilverio Decampndings - The  perianal and digital rectal  examinations were normal. - A 15 mm polyp was found in the ascending colon. The polyp was semi-pedunculated. Resection and retrieval were complete. - A 25 mm polyp was found in the sigmoid colon. The polyp was pedunculated. Resection and retrieval were complete. - An infiltrative partially obstructing large mass was found in the proximal rectum. The mass was partially circumferential (involving one-half of the lumen circumference). The mass measured eight cm in length extending from 10 to 18cm from anal verge. This was biopsied with a cold forceps for histology. Proximal and distal opposite fold area of the mass lesion was tattooed with an injection of total 3 mL of Spot (carbon black).   12/01/2020 Initial Biopsy   Diagnosis 1. Colon, polyp(s), ascending x 1 - ADENOCARCINOMA ARISING IN A TUBULAR ADENOMA WITH HIGH-GRADE DYSPLASIA. SEE NOTE 2. Colon, sigmoid polyp, x1 - TUBULOVILLOUS ADENOMA(S) - NEGATIVE FOR HIGH-GRADE DYSPLASIA OR MALIGNANCY 3. Rectum, biopsy - ADENOCARCINOMA. SEE NOTE   12/01/2020 Cancer Staging   Cancer Staging Rectal adenocarcinoma [facility]) Staging form: Colon and Rectum, AJCC 8th Edition - Clinical stage from 12/21/2020: Stage IIIB (cT3, cN1, cM0) - Unsigned    12/06/2020 Imaging   CT CAP with contrast IMPRESSION: 1. There is partially circumferential soft tissue thickening of the mid to superior rectum, approximately 5 cm in length and the inferior extent approximately 6 cm above the anal verge, consistent with primary rectal malignancy identified by colonoscopy. 2. There appear to be abnormally enlarged perirectal lymph nodes posteriorly about the superior rectum measuring up to 1.0 x 0.8 cm. Findings are suspicious for perirectal nodal metastatic disease, however rectal MRI is the test of choice for initial local staging of rectal cancer. 3. There is a 4 mm nonspecific pulmonary nodule of the superior segment left lower lobe, statistically most likely  incidental, infectious or inflammatory, although nonspecific and isolated metastatic disease is not strictly excluded. Attention on follow-up. 4. No other evidence of metastatic disease in the chest, abdomen, or pelvis. Aortic Atherosclerosis (ICD10-I70.0).   12/09/2020 Imaging   Local staging MRI pelvis without contrast IMPRESSION: 4.9 cm circumferential mid/lower rectal mass, corresponding to the patient's newly diagnosed rectal cancer. Rectal adenocarcinoma T stage: T3c Rectal adenocarcinoma N stage:  N1 Distance from tumor to the internal anal sphincter is 3.7 cm.   12/21/2020 Initial Diagnosis   Rectal adenocarcinoma ([place])   01/04/2021 -  Chemotherapy   Concurrent chemoradiation with Xeloda 1052m in the AM and 15066min the PM on days of Radiation starting 01/04/21.       01/04/2021 - 02/11/2021 Radiation Therapy   Concurrent chemoradiation with Dr MoLisbeth Renshawnd Xeloda starting 01/04/21.    01/31/2022 Procedure   Colonoscopy, Dr. NaSilverio DecampFindings: - An infiltrative partially obstructing large mass was found in the rectum. The mass was circumferential. The mass measured ten cm in length, extending from 5-15cm from anal verge. In addition, rectal diameter at narrowest approx twelve mm. Oozing was present. Biopsies were taken with a cold forceps for histology.  Impression: - Hemorrhoids found on perianal exam. - Likely malignant partially obstructing tumor in the rectum. Biopsied. - Non-bleeding external and internal hemorrhoids.   01/31/2022 Pathology Results   Diagnosis Rectum, biopsy INVASIVE MODERATELY DIFFERENTIATED ADENOCARCINOMA   05/11/2022 -  Chemotherapy   Patient is on Treatment Plan : COLORECTAL Xelox (Capeox)(130/850) q21d        INTERVAL HISTORY:  Jillian Lutz here for a follow up of rectal cancer. She was last seen by NP  [name] on 05/17/22. She presents to the clinic accompanied by an interpreter and Optometrist. She reports she is doing well  overall. She endorses using only milk of magnesia for constipation, not taking miralax.   All other systems were reviewed with the patient and are negative.  MEDICAL HISTORY:  Past Medical History:  Diagnosis Date   Bilateral wrist pain 10/25/2017   Hypertension    Neck mass 1998   Unknown biopsy results. Excised in [place].   Pre-diabetes    Rectal cancer ([place])    Trigger finger, acquired 02/08/2017    SURGICAL HISTORY: Past Surgical History:  Procedure Laterality Date   BRONCHIAL BIOPSY  12/13/2021   Procedure: BRONCHIAL BIOPSIES;  Surgeon: Garner Nash, DO;  Location: [place] [facility];  Service: Pulmonary;;   BRONCHIAL NEEDLE ASPIRATION BIOPSY  12/13/2021   Procedure: BRONCHIAL NEEDLE ASPIRATION BIOPSIES;  Surgeon: Garner Nash, DO;  Location: [facility];  Service: Pulmonary;;   NECK SURGERY Left 1998   Excision of mass in [place]   VIDEO BRONCHOSCOPY WITH RADIAL ENDOBRONCHIAL ULTRASOUND  12/13/2021   Procedure: RADIAL ENDOBRONCHIAL ULTRASOUND;  Surgeon: Garner Nash, DO;  Location: [facility];  Service: Pulmonary;;    I have reviewed the social history and family history with the patient and they are unchanged from previous note.  ALLERGIES:  is allergic to no known allergies.  MEDICATIONS:  Current Outpatient Medications  Medication Sig Dispense Refill   amLODipine (NORVASC) 5 MG tablet Take 1 tablet (5 mg total) by mouth daily. 90 tablet 3   capecitabine (XELODA) 500 MG tablet Take 2 tablets (1000 mg) by mouth in morning and 3 tablets (1500 mg) by mouth in evening. Take within 30 minutes after meals. Take for 14 days on, 7 days off. Repeat every 21 days. 70 tablet 0   gabapentin (NEURONTIN) 100 MG capsule Take 1 capsule (100 mg total) by mouth 3 (three) times daily as needed. 90 capsule 2   lisinopril-hydrochlorothiazide (ZESTORETIC) 20-12.5 MG tablet Take 2 tablets by mouth daily. 60 tablet 3   Multiple Vitamins-Minerals (MULTIVITAMIN WITH MINERALS) tablet Take  1 tablet by mouth daily. Gummy 50 mg     prochlorperazine (COMPAZINE) 10 MG tablet Take 1 tablet (10 mg total) by mouth every 6 (six) hours as needed. 30 tablet 2   No current facility-administered medications for this visit.    PHYSICAL EXAMINATION: ECOG PERFORMANCE STATUS: 1 - Symptomatic but completely ambulatory  Vitals:   06/01/22 1016  BP: 134/84  Pulse: 71  Resp: 18  Temp: 98.2 F (36.8 C)  SpO2: 100%   Wt Readings from Last 3 Encounters:  06/01/22 126 lb 9.6 oz (57.4 kg)  05/17/22 124 lb 11.2 oz (56.6 kg)  05/11/22 128 lb 14.4 oz (58.5 kg)     GENERAL:alert, no distress and comfortable SKIN: skin color normal, no rashes or significant lesions EYES: normal, Conjunctiva are pink and non-injected, sclera clear  NEURO: alert & oriented x 3 with fluent speech  LABORATORY DATA:  I have reviewed the data as listed    Latest Ref Rng & Units 06/01/2022    9:56 AM 05/17/2022    9:05 AM 05/11/2022   10:09 AM  CBC  WBC 4.0 - 10.5 K/uL 5.8  6.0  6.1   Hemoglobin 12.0 - 15.0 g/dL 11.8  12.2  11.7   Hematocrit 36.0 - 46.0 % 35.2  37.1  34.9   Platelets 150 - 400 K/uL 266  193  209  Latest Ref Rng & Units 06/01/2022    9:56 AM 05/17/2022    9:05 AM 05/11/2022   10:09 AM  CMP  Glucose 70 - 99 mg/dL 111  122  220   BUN 8 - 23 mg/dL _0 Creatinine 0.44 - 1.00 mg/dL 0.67  0.82  0.77   Sodium 135 - 145 mmol/L 139  135  139   Potassium 3.5 - 5.1 mmol/L 3.7  3.8  3.5   Chloride 98 - 111 mmol/L 106  98  104   CO2 22 - 32 mmol/L 27  34  29   Calcium 8.9 - 10.3 mg/dL 9.1  8.9  8.6   Total Protein 6.5 - 8.1 g/dL 7.2  7.1  6.4   Total Bilirubin 0.3 - 1.2 mg/dL 0.4  0.5  0.3   Alkaline Phos 38 - 126 U/L 55  58  55   AST 15 - 41 U/L _1 ALT 0 - 44 U/L _2 RADIOGRAPHIC STUDIES: I have personally reviewed the radiological images as listed and agreed with the findings in the report. No results found.    Orders Placed This Encounter   Procedures   CBC with Differential ([place] Only)    Standing Status:   Future    Standing Expiration Date:   06/23/2023   CMP ([place] only)    Standing Status:   Future    Standing Expiration Date:   06/23/2023   CBC with Differential ([place] Only)    Standing Status:   Future    Standing Expiration Date:   07/14/2023   CMP ([place] only)    Standing Status:   Future    Standing Expiration Date:   07/14/2023   Ambulatory Referral to [facility]    Referral Priority:   Routine    Referral Type:   Consultation    Referral Reason:   Specialty Services Required    Requested Specialty:   Oncology    Number of Visits Requested:   1   All questions were answered. The patient knows to call the clinic with any problems, questions or concerns. No barriers to learning was detected. The total time spent in the appointment was 30 minutes.     [name], MD 06/01/2022   I, Wilburn Mylar, am acting as scribe for [name], MD.   I have reviewed the above documentation for accuracy and completeness, and I agree with the above.

## 2022-06-02 ENCOUNTER — Other Ambulatory Visit: Payer: Self-pay

## 2022-06-02 NOTE — Progress Notes (Signed)
FO ordered per Dr. Burr Medico request on rectal biopsy done on 01/31/2022.  [facility] Accession# [id number].  FO order# [id number].

## 2022-06-03 ENCOUNTER — Other Ambulatory Visit: Payer: Self-pay

## 2022-06-05 ENCOUNTER — Other Ambulatory Visit: Payer: Self-pay | Admitting: Student

## 2022-06-05 ENCOUNTER — Other Ambulatory Visit: Payer: Self-pay | Admitting: Hematology

## 2022-06-05 ENCOUNTER — Other Ambulatory Visit (HOSPITAL_COMMUNITY): Payer: Self-pay

## 2022-06-05 ENCOUNTER — Encounter: Payer: Self-pay | Admitting: Hematology

## 2022-06-05 DIAGNOSIS — R202 Paresthesia of skin: Secondary | ICD-10-CM

## 2022-06-05 DIAGNOSIS — C20 Malignant neoplasm of rectum: Secondary | ICD-10-CM

## 2022-06-05 MED ORDER — CAPECITABINE 500 MG PO TABS
ORAL_TABLET | ORAL | 0 refills | Status: DC
  Filled 2022-06-05: qty 70, 14d supply, fill #0

## 2022-06-05 NOTE — Telephone Encounter (Signed)
Next appt scheduled tomorrow with her PCP.

## 2022-06-06 ENCOUNTER — Other Ambulatory Visit: Payer: Self-pay

## 2022-06-06 ENCOUNTER — Other Ambulatory Visit (HOSPITAL_COMMUNITY): Payer: Self-pay

## 2022-06-06 ENCOUNTER — Ambulatory Visit: Payer: Medicaid Other | Admitting: Student

## 2022-06-06 ENCOUNTER — Encounter: Payer: Self-pay | Admitting: Student

## 2022-06-06 VITALS — BP 114/76 | HR 75 | Ht 59.0 in | Wt 125.0 lb

## 2022-06-06 DIAGNOSIS — K1379 Other lesions of oral mucosa: Secondary | ICD-10-CM | POA: Diagnosis not present

## 2022-06-06 DIAGNOSIS — K5900 Constipation, unspecified: Secondary | ICD-10-CM | POA: Insufficient documentation

## 2022-06-06 DIAGNOSIS — R202 Paresthesia of skin: Secondary | ICD-10-CM | POA: Diagnosis not present

## 2022-06-06 DIAGNOSIS — I10 Essential (primary) hypertension: Secondary | ICD-10-CM | POA: Diagnosis not present

## 2022-06-06 DIAGNOSIS — H269 Unspecified cataract: Secondary | ICD-10-CM | POA: Diagnosis not present

## 2022-06-06 DIAGNOSIS — K5903 Drug induced constipation: Secondary | ICD-10-CM | POA: Diagnosis present

## 2022-06-06 DIAGNOSIS — Z658 Other specified problems related to psychosocial circumstances: Secondary | ICD-10-CM

## 2022-06-06 DIAGNOSIS — Z0000 Encounter for general adult medical examination without abnormal findings: Secondary | ICD-10-CM

## 2022-06-06 DIAGNOSIS — C20 Malignant neoplasm of rectum: Secondary | ICD-10-CM

## 2022-06-06 MED ORDER — GABAPENTIN 100 MG PO CAPS
100.0000 mg | ORAL_CAPSULE | Freq: Three times a day (TID) | ORAL | 2 refills | Status: DC | PRN
  Filled 2022-06-06: qty 90, 30d supply, fill #0
  Filled 2022-07-03: qty 90, 30d supply, fill #1
  Filled 2022-08-01: qty 90, 30d supply, fill #2

## 2022-06-06 MED ORDER — GABAPENTIN 100 MG PO CAPS
100.0000 mg | ORAL_CAPSULE | Freq: Three times a day (TID) | ORAL | 2 refills | Status: DC | PRN
  Filled 2022-06-06: qty 90, 30d supply, fill #0

## 2022-06-06 NOTE — Assessment & Plan Note (Signed)
Recent CT abdomen/pelvis showed persistent mass-like thickening of the rectum which is likely residual neoplasm.  -Continue Xeloda and oxaliplatin -Follow-up with oncology on 06/22/2022

## 2022-06-06 NOTE — Assessment & Plan Note (Signed)
-  Referral to ophthalmology since she has Medicaid now

## 2022-06-06 NOTE — Patient Instructions (Signed)
Jillian Lutz,  It was a pleasure seeing you today in the clinic.  Here is a summary of what we talked about:  1.  Your blood pressure is well-controlled.  Please continue amlodipine and lisinopril-HCTZ  2.  Please continue taking milk of magnesium and MiraLAX every day to help with the constipation.  3.  I will reach out to Dr. Morey Hummingbird about your mouth pain after chemotherapy.  4.  I placed another referral to ophthalmology for your cataracts  Please return in 3 months, sooner if needed  Take care  Dr. Alfonse Spruce

## 2022-06-06 NOTE — Assessment & Plan Note (Signed)
Patient reports constipation after her chemotherapy.  She states that MiraLAX alone did not help move her bowels.  She is currently taking 1 dose of MiraLAX with 1/2 dose of milk of magnesia every day which provide good effect.  She endorses significant rectal pain if she does not have a bowel movement for 2-3 days.  I encouraged her to do this regimen daily while on chemotherapy to ensure regular bowel movements.

## 2022-06-06 NOTE — Congregational Nurse Program (Signed)
Accompanied patient to PCP appointment at [facility].  Patient stated she is feeling better today but was very tired for 3 days after Chemo and also C/O mandibular pain when eating that would eventually improve as she continued to eat.  This has now subsided.  She did not contact CN regarding these issues because she felt like she could deal with them.  Following appointment CN picked up the following prescriptions from [place] and delivered them to patient: Amlodipine 5 mg, Gabapentin 100 mg, and Lisinopril/hctz 20/12.5 mg.  Informed patient that Medicaid transportation has been requested for her next 3 appointments at [facility].  Jake Michaelis RN, Congregational Nurse [phone].

## 2022-06-06 NOTE — Assessment & Plan Note (Signed)
Blood pressure is well-controlled 114/76.  Patient report adherence to medications. Last CMP 11/16 unremarkable  -Continue amlodipine 5 mg daily -Continue lisinopril-HCTZ 20-12.5 mg daily

## 2022-06-06 NOTE — Assessment & Plan Note (Signed)
Patient declines mammogram and DEXA scan today.  She would like to take care of her rectal cancer before doing more tests.

## 2022-06-06 NOTE — Progress Notes (Signed)
CC: Poor appetite and constipation  HPI:  [name] is a 71 y.o. living with hypertension and rectal cancer who presented to the clinic for issues of poor appetite and constipation after chemotherapy.  Please see problem based charting for detail  Past Medical History:  Diagnosis Date   Bilateral wrist pain 10/25/2017   Hypertension    Neck mass 1998   Unknown biopsy results. Excised in [place].   Pre-diabetes    Rectal cancer ([place])    Trigger finger, acquired 02/08/2017   Review of Systems: Per HPI  Physical Exam:  Vitals:   06/06/22 1335  BP: 114/76  Pulse: 75  SpO2: 99%  Weight: 125 lb (56.7 kg)  Height: '4\' 11"'$  (1.499 m)   Physical Exam Constitutional:      General: She is not in acute distress.    Appearance: She is not ill-appearing.  HENT:     Head: Normocephalic.     Mouth/Throat:     Comments: No oral ulcers noted.  Missing half of her lower teeth but no obvious infection seen.  No discharge, or bleeding.  No mucosal erythema.  No cervical lymphadenopathy.  No thyromegaly.  No pain to palpation of her lower jaw. Eyes:     General:        Right eye: No discharge.        Left eye: No discharge.     Conjunctiva/sclera: Conjunctivae normal.  Cardiovascular:     Rate and Rhythm: Normal rate and regular rhythm.  Pulmonary:     Effort: Pulmonary effort is normal. No respiratory distress.     Breath sounds: Normal breath sounds. No wheezing.  Musculoskeletal:     Cervical back: Normal range of motion.  Skin:    General: Skin is warm.  Neurological:     Mental Status: She is alert.  Psychiatric:        Mood and Affect: Mood normal.        Behavior: Behavior normal.      Assessment & Plan:   See Encounters Tab for problem based charting.  Essential hypertension Blood pressure is well-controlled 114/76.  Patient report adherence to medications. Last CMP 11/16 unremarkable  -Continue amlodipine 5 mg daily -Continue lisinopril-HCTZ 20-12.5 mg  daily  Mouth pain Reports poor appetite and mouth pain after her chemotherapy.  Pain localized in the lower jaw when she initially put food in her mouth.  Pain improves after continue eating.  Pain usually started after her IV chemo and lasted for about 5 days.  Patient denies nausea, tooth pain or dysphagia.  Pain has completely resolved today and she is eating normally today.  Physical exam was unrevealing.  No oral ulcers noted.  Missing half of her lower teeth but no obvious infection seen.  No discharge, or bleeding.  No mucosal erythema.  No cervical lymphadenopathy.  No thyromegaly.  No pain to palpation of her lower jaw.  Unclear the etiology of her mouth pain.  Low suspicion for TMJ or GCA.  Could be dental pain but patient denies and it should not improves after eating.  This could be a side effect from her chemotherapy given the timing.  I could not find this side effect on up-to-date.  Will reach out to Dr. Burr Medico, her oncologist, about this symptom.  Regarding her poor appetite, patient is maintaining her weight for the last few months.  She is actually gaining weight.  Patient reports poor appetite but she is eating a small amount throughout  the day.  She reports eating starch with vegetable and lower amount of meat.  I think that after she no longer working and staying at home, she is able to take care of herself and eat more compared to before.  Constipation Patient reports constipation after her chemotherapy.  She states that MiraLAX alone did not help move her bowels.  She is currently taking 1 dose of MiraLAX with 1/2 dose of milk of magnesia every day which provide good effect.  She endorses significant rectal pain if she does not have a bowel movement for 2-3 days.  I encouraged her to do this regimen daily while on chemotherapy to ensure regular bowel movements.  Cataracts, bilateral -Referral to ophthalmology since she has Medicaid now  Rectal adenocarcinoma [facility]) Recent CT  abdomen/pelvis showed persistent mass-like thickening of the rectum which is likely residual neoplasm.  -Continue Xeloda and oxaliplatin -Follow-up with oncology on 06/22/2022  Healthcare maintenance Patient declines mammogram and DEXA scan today.  She would like to take care of her rectal cancer before doing more tests.   Patient discussed with Dr. Dareen Piano

## 2022-06-06 NOTE — Assessment & Plan Note (Addendum)
Reports poor appetite and mouth pain after her chemotherapy.  Pain localized in the lower jaw when she initially put food in her mouth.  Pain improves after continue eating.  Pain usually started after her IV chemo and lasted for about 5 days.  Patient denies nausea, tooth pain or dysphagia.  Pain has completely resolved today and she is eating normally today.  Physical exam was unrevealing.  No oral ulcers noted.  Missing half of her lower teeth but no obvious infection seen.  No discharge, or bleeding.  No mucosal erythema.  No cervical lymphadenopathy.  No thyromegaly.  No pain to palpation of her lower jaw.  Unclear the etiology of her mouth pain.  Low suspicion for TMJ or GCA.  Could be dental pain but patient denies and it should not improves after eating.  This could be a side effect from her chemotherapy given the timing.  I could not find this side effect on up-to-date.  Will reach out to Dr. Burr Medico, her oncologist, about this symptom.  Regarding her poor appetite, patient is maintaining her weight for the last few months.  She is actually gaining weight.  Patient reports poor appetite but she is eating a small amount throughout the day.  She reports eating starch with vegetable and lower amount of meat.  I think that after she no longer working and staying at home, she is able to take care of herself and eat more compared to before.

## 2022-06-07 ENCOUNTER — Other Ambulatory Visit: Payer: Self-pay

## 2022-06-07 NOTE — Progress Notes (Signed)
Internal Medicine Clinic Attending  I saw and evaluated the patient.  I personally confirmed the key portions of the history and exam documented by Dr. Nguyen and I reviewed pertinent patient test results.  The assessment, diagnosis, and plan were formulated together and I agree with the documentation in the resident's note.\  

## 2022-06-13 ENCOUNTER — Inpatient Hospital Stay: Payer: Medicaid Other | Admitting: Dietician

## 2022-06-13 NOTE — Progress Notes (Signed)
Nutrition Assessment   Reason for Assessment: Referral (poor appetite)   ASSESSMENT: 71 year old female with rectal cancer diagnosed 12/21/20. Patient completed concurrent chemoradiation with Dr. Lisbeth Renshaw (01/04/21-02/11/21). She is currently receiving Capeox q21d (first 10/26). Patient is under the care of Dr. Burr Medico  Past medical history includes HTN, history of TB, dysuria, carpal tunnel syndrome  Met with patient in clinic. She is accompanied by [name] Forensic scientist) and interpretor Emogene Morgan) who is providing service via telephone today. Patient reports her appetite is poor. Food does not taste good. She recalls 2 small meals (rice, vegetables, occasional fish). Patient eats little meat. She has been drinking 2 Ensure after meals. Patient unsure of which kind. She drinks 3-4 bottles of water daily and small amounts of no added sugar apple juice. Patient denies nausea, vomiting, diarrhea. She is taking miralax + MOM for constipation.    Nutrition Focused Physical Exam: deferred   Medications: amlodipine, gabapentin, zestoretic, MVI, compazine   Labs: 11/16 glucose 111   Anthropometrics:   Height: 4'11" Weight: 125 lb (11/21) UBW: 117-120 lb  BMI: 25.25   NUTRITION DIAGNOSIS: Food and nutrition related knowledge deficit related to rectal cancer as evidenced by dietary history    INTERVENTION:  Educated on importance of adequate calorie and protein energy intake to maintain weights/strength Discussed foods that provide protein, encouraged protein foods with all meals/snacks - handout with ideas provided Encouraged smaller more frequent meals and snacks vs 2 meals Patient agreeable to bedtime snack Continue drinking Ensure twice daily, recommend Ensure Plus/equivalent (350 kcal, 20 g) - coupons, samples given Unable to provide complimentary case today (currently on back order) Will contact [name] when this is available  Discussed strategies for altered taste, suggested trying  baking soda salt water rinses several times daily before meals - handout with tips + recipe provided [name] says she will make this for pt) Contact information given   MONITORING, EVALUATION, GOAL: Patient will tolerate adequate calories and protein to minimize weight loss   Next Visit: Thursday December 28 during infusion

## 2022-06-14 NOTE — Congregational Nurse Program (Signed)
Home visit with interpreter [name].  Reviewed nutrition information given to her yesterday by dietician.  CN mixed a bottle of mouth rinse per instructions and patient stated she understands how to use it. Finishes current round of Xeloda this evening.  Reminded her she starts next round on 06/22/2022 when she has next dose of IV chemo. Told her transportation has been requested for that appointment.  Jake Michaelis RN, Congregational Nurse [phone]

## 2022-06-16 DIAGNOSIS — I82409 Acute embolism and thrombosis of unspecified deep veins of unspecified lower extremity: Secondary | ICD-10-CM

## 2022-06-16 HISTORY — DX: Acute embolism and thrombosis of unspecified deep veins of unspecified lower extremity: I82.409

## 2022-06-19 ENCOUNTER — Other Ambulatory Visit (HOSPITAL_COMMUNITY): Payer: Self-pay

## 2022-06-21 ENCOUNTER — Inpatient Hospital Stay: Payer: Medicaid Other | Admitting: Hematology

## 2022-06-21 ENCOUNTER — Inpatient Hospital Stay: Payer: Medicaid Other

## 2022-06-21 MED FILL — Dexamethasone Sodium Phosphate Inj 100 MG/10ML: INTRAMUSCULAR | Qty: 1 | Status: AC

## 2022-06-21 NOTE — Congregational Nurse Program (Signed)
Home visit with interpreter Jillian Lutz.  Delivered Xeloda which CN picked up from [facility] yesterday.  Patient will take 2 tablets tomorrow morning after breakfast prior to her appointment for IV chemo. Reviewed instructions for remaining 2 weeks of medication.  States she is feeling good and having normal BM's.  Also reports she is starting to loose her hair but says she will wear a wig if needed.  Also reports 1 bruise on each arm and problems with dry, cracked hands despite using hand creams.  Advised her that her ride should arrive between 8:00 and 8:30 tomorrow.for 9:00 am appointment.  Jake Michaelis RN, Congregational Nurse [phone]

## 2022-06-22 ENCOUNTER — Other Ambulatory Visit (HOSPITAL_COMMUNITY): Payer: Self-pay

## 2022-06-22 ENCOUNTER — Encounter: Payer: Self-pay | Admitting: Dietician

## 2022-06-22 ENCOUNTER — Inpatient Hospital Stay: Payer: Medicaid Other | Attending: Nurse Practitioner

## 2022-06-22 ENCOUNTER — Encounter: Payer: Self-pay | Admitting: Nurse Practitioner

## 2022-06-22 ENCOUNTER — Inpatient Hospital Stay: Payer: Medicaid Other

## 2022-06-22 ENCOUNTER — Other Ambulatory Visit: Payer: Self-pay

## 2022-06-22 ENCOUNTER — Inpatient Hospital Stay (HOSPITAL_BASED_OUTPATIENT_CLINIC_OR_DEPARTMENT_OTHER): Payer: Medicaid Other | Admitting: Nurse Practitioner

## 2022-06-22 VITALS — BP 146/84 | HR 69 | Temp 97.9°F | Resp 20 | Wt 128.3 lb

## 2022-06-22 DIAGNOSIS — E1140 Type 2 diabetes mellitus with diabetic neuropathy, unspecified: Secondary | ICD-10-CM | POA: Insufficient documentation

## 2022-06-22 DIAGNOSIS — I10 Essential (primary) hypertension: Secondary | ICD-10-CM | POA: Diagnosis not present

## 2022-06-22 DIAGNOSIS — C19 Malignant neoplasm of rectosigmoid junction: Secondary | ICD-10-CM | POA: Insufficient documentation

## 2022-06-22 DIAGNOSIS — E119 Type 2 diabetes mellitus without complications: Secondary | ICD-10-CM | POA: Diagnosis not present

## 2022-06-22 DIAGNOSIS — C20 Malignant neoplasm of rectum: Secondary | ICD-10-CM

## 2022-06-22 DIAGNOSIS — Z5111 Encounter for antineoplastic chemotherapy: Secondary | ICD-10-CM | POA: Diagnosis present

## 2022-06-22 LAB — CMP (CANCER CENTER ONLY)
ALT: 7 U/L (ref 0–44)
AST: 21 U/L (ref 15–41)
Albumin: 3.6 g/dL (ref 3.5–5.0)
Alkaline Phosphatase: 53 U/L (ref 38–126)
Anion gap: 9 (ref 5–15)
BUN: 13 mg/dL (ref 8–23)
CO2: 26 mmol/L (ref 22–32)
Calcium: 9.1 mg/dL (ref 8.9–10.3)
Chloride: 105 mmol/L (ref 98–111)
Creatinine: 0.65 mg/dL (ref 0.44–1.00)
GFR, Estimated: >60 mL/min (ref >60)
Glucose, Bld: 175 mg/dL — ABNORMAL HIGH (ref 70–99)
Potassium: 3.3 mmol/L — ABNORMAL LOW (ref 3.5–5.1)
Sodium: 140 mmol/L (ref 135–145)
Total Bilirubin: 0.4 mg/dL (ref 0.3–1.2)
Total Protein: 6.6 g/dL (ref 6.5–8.1)

## 2022-06-22 LAB — CBC WITH DIFFERENTIAL (CANCER CENTER ONLY)
Abs Immature Granulocytes: 0.02 10*3/uL (ref 0.00–0.07)
Basophils Absolute: 0 10*3/uL (ref 0.0–0.1)
Basophils Relative: 1 %
Eosinophils Absolute: 0.2 10*3/uL (ref 0.0–0.5)
Eosinophils Relative: 4 %
HCT: 34.9 % — ABNORMAL LOW (ref 36.0–46.0)
Hemoglobin: 11.7 g/dL — ABNORMAL LOW (ref 12.0–15.0)
Immature Granulocytes: 1 %
Lymphocytes Relative: 24 %
Lymphs Abs: 1 10*3/uL (ref 0.7–4.0)
MCH: 27.9 pg (ref 26.0–34.0)
MCHC: 33.5 g/dL (ref 30.0–36.0)
MCV: 83.3 fL (ref 80.0–100.0)
Monocytes Absolute: 0.7 10*3/uL (ref 0.1–1.0)
Monocytes Relative: 16 %
Neutro Abs: 2.2 10*3/uL (ref 1.7–7.7)
Neutrophils Relative %: 54 %
Platelet Count: 131 10*3/uL — ABNORMAL LOW (ref 150–400)
RBC: 4.19 MIL/uL (ref 3.87–5.11)
RDW: 19.2 % — ABNORMAL HIGH (ref 11.5–15.5)
WBC Count: 4 10*3/uL (ref 4.0–10.5)
nRBC: 0 % (ref 0.0–0.2)

## 2022-06-22 LAB — CEA (ACCESS): CEA (CHCC): 7.19 ng/mL — ABNORMAL HIGH (ref 0.00–5.00)

## 2022-06-22 MED ORDER — OXALIPLATIN CHEMO INJECTION 100 MG/20ML
110.0000 mg/m2 | Freq: Once | INTRAVENOUS | Status: AC
  Administered 2022-06-22: 170 mg via INTRAVENOUS
  Filled 2022-06-22: qty 34

## 2022-06-22 MED ORDER — PALONOSETRON HCL INJECTION 0.25 MG/5ML
0.2500 mg | Freq: Once | INTRAVENOUS | Status: AC
  Administered 2022-06-22: 0.25 mg via INTRAVENOUS
  Filled 2022-06-22: qty 5

## 2022-06-22 MED ORDER — DEXTROSE 5 % IV SOLN: Freq: Once | INTRAVENOUS | Status: AC

## 2022-06-22 MED ORDER — SODIUM CHLORIDE 0.9 % IV SOLN
10.0000 mg | Freq: Once | INTRAVENOUS | Status: AC
  Administered 2022-06-22: 10 mg via INTRAVENOUS
  Filled 2022-06-22: qty 10

## 2022-06-22 MED ORDER — UREA 10 % EX CREA
TOPICAL_CREAM | Freq: Two times a day (BID) | CUTANEOUS | 3 refills | Status: AC | PRN
  Filled 2022-06-22: qty 85, 30d supply, fill #0

## 2022-06-22 NOTE — Progress Notes (Signed)
[place]   Telephone:([phone]) [phone] Fax:([phone]) [phone]   Clinic Follow up Note   Patient Care Team: Gaylan Gerold, DO as PCP - General (Internal Medicine) Jonnie Finner, RN (Inactive) as Oncology Nurse Navigator Alla Feeling, NP as Nurse Practitioner (Nurse Practitioner) [name], MD as Consulting Physician (Hematology and Oncology) Kyung Rudd, MD as Consulting Physician (Radiation Oncology) [name], Telford Nab., MD as Consulting Physician (Gastroenterology) Garner Nash, DO as Consulting Physician (Pulmonary Disease) Michael Boston, MD as Consulting Physician (General Surgery) 06/22/2022  CHIEF COMPLAINT: Follow-up colon and rectal cancer  SUMMARY OF ONCOLOGIC HISTORY: Oncology History Overview Note  Cancer Staging Rectal adenocarcinoma [facility]) Staging form: Colon and Rectum, AJCC 8th Edition - Clinical stage from 12/21/2020: Stage IIIB (cT3, cN1, cM0) - Unsigned    Rectal adenocarcinoma ([place])  08/26/2020 Miscellaneous   Initial presentation to PCP, reporting intermittent BRBPR since 02/2020    12/01/2020 Procedure   Colonoscopy by Dr. Silverio Decamp findings - The perianal and digital rectal examinations were normal. - A 15 mm polyp was found in the ascending colon. The polyp was semi-pedunculated. Resection and retrieval were complete. - A 25 mm polyp was found in the sigmoid colon. The polyp was pedunculated. Resection and retrieval were complete. - An infiltrative partially obstructing large mass was found in the proximal rectum. The mass was partially circumferential (involving one-half of the lumen circumference). The mass measured eight cm in length extending from 10 to 18cm from anal verge. This was biopsied with a cold forceps for histology. Proximal and distal opposite fold area of the mass lesion was tattooed with an injection of total 3 mL of Spot (carbon black).   12/01/2020 Initial Biopsy   Diagnosis 1. 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 SEE NOTE 2. Colon, sigmoid polyp, x1 - TUBULOVILLOUS ADENOMA(S) - NEGATIVE FOR HIGH-GRADE DYSPLASIA OR MALIGNANCY 3. Rectum, biopsy - ADENOCARCINOMA. SEE NOTE   12/01/2020 Cancer Staging   Cancer Staging Rectal adenocarcinoma [facility]) Staging form: Colon and Rectum, AJCC 8th Edition - Clinical stage from 12/21/2020: Stage IIIB (cT3, cN1, cM0) - Unsigned    12/06/2020 Imaging   CT CAP with contrast IMPRESSION: 1. There is partially circumferential soft tissue thickening of the mid to superior rectum, approximately 5 cm in length and the inferior extent approximately 6 cm above the anal verge, consistent with primary rectal malignancy identified by colonoscopy. 2. There appear to be abnormally enlarged perirectal lymph nodes posteriorly about the superior rectum measuring up to 1.0 x 0.8 cm. Findings are suspicious for perirectal nodal metastatic disease, however rectal MRI is the test of choice for initial local staging of rectal cancer. 3. There is a 4 mm nonspecific pulmonary nodule of the superior segment left lower lobe, statistically most likely incidental, infectious or inflammatory, although nonspecific and isolated metastatic disease is not strictly excluded. Attention on follow-up. 4. No other evidence of metastatic disease in the chest, abdomen, or pelvis. Aortic Atherosclerosis (ICD10-I70.0).   12/09/2020 Imaging   Local staging MRI pelvis without contrast IMPRESSION: 4.9 cm circumferential mid/lower rectal mass, corresponding to the patient's newly diagnosed rectal cancer. Rectal adenocarcinoma T stage: T3c Rectal adenocarcinoma N stage:  N1 Distance from tumor to the internal anal sphincter is 3.7 cm.   12/21/2020 Initial Diagnosis   Rectal adenocarcinoma ([place])   01/04/2021 -  Chemotherapy   Concurrent chemoradiation with Xeloda 1034m in the AM and 15027min the PM on days of Radiation starting  01/04/21.       01/04/2021 - 02/11/2021 Radiation Therapy   Concurrent chemoradiation with Dr Lisbeth Renshaw and Xeloda starting 01/04/21.    01/31/2022 Procedure   Colonoscopy, Dr. Silverio Decamp  Findings: - An infiltrative partially obstructing large mass was found in the rectum. The mass was circumferential. The mass measured ten cm in length, extending from 5-15cm from anal verge. In addition, rectal diameter at narrowest approx twelve mm. Oozing was present. Biopsies were taken with a cold forceps for histology.  Impression: - Hemorrhoids found on perianal exam. - Likely malignant partially obstructing tumor in the rectum. Biopsied. - Non-bleeding external and internal hemorrhoids.   01/31/2022 Pathology Results   Diagnosis Rectum, biopsy INVASIVE MODERATELY DIFFERENTIATED ADENOCARCINOMA   05/11/2022 -  Chemotherapy   Patient is on Treatment Plan : COLORECTAL Xelox (Capeox)(130/850) q21d       CURRENT THERAPY: CAPEOX, q21d, starting 05/11/22             -Xeloda dose: 1065m AM, 15071mPM, days 1-14  INTERVAL HISTORY: Ms. NiWisniewskieturns for follow-up and treatment as scheduled, last seen by Dr. FeBurr Medico1/16/2023 and completed cycle 2 CapeOx.  She feels "chilly" for a couple days after infusion and has jaw tightness for a week.  No significant oral or peripheral cold sensitivity/neuropathy.  She has low appetite while on Xeloda.  This improves on her week off.  He has worsening hand dryness and a painful crack on one of the fingertips.  This does not resolve much on her week off.  She uses thick lotion which also does not help.  She continues to do light housework but not able to work out of the home.  Bowels moving normally on MiraLAX.  Denies rectal bleeding, nausea/vomiting, pain, or any other new specific complaints.  She began the current cycle of Xeloda this morning  All other systems were reviewed with the patient and are negative.  MEDICAL HISTORY:  Past Medical History:  Diagnosis Date    Bilateral wrist pain 10/25/2017   Hypertension    Neck mass 1998   [place] biopsy [place]. [place]  Pre-diabetes    [place]   Trigger finger, acquired 02/08/2017    SURGICAL HISTORY: Past Surgical History:  Procedure Laterality Date   BRONCHIAL BIOPSY  12/13/2021   Procedure: BRONCHIAL BIOPSIES;  Surgeon: IcGarner NashDO;  [facility];  Service: Pulmonary;;   BRONCHIAL NEEDLE ASPIRATION BIOPSY  12/13/2021   Procedure: BRONCHIAL NEEDLE ASPIRATION BIOPSIES;  Surgeon: IcGarner NashDO;  [facility];  Service: Pulmonary;;   NECK SURGERY Left 1998   Excision of [place] in [place] BRONCHOSCOPY WITH RADIAL ENDOBRONCHIAL ULTRASOUND  12/13/2021   Procedure: RADIAL ENDOBRONCHIAL ULTRASOUND;  Surgeon: IcGarner NashDO;  [facility];  Service: Pulmonary;;    I have reviewed the social history and family history with the patient and they are unchanged from previous note.  ALLERGIES:  is allergic to no known allergies.  MEDICATIONS:  Current Outpatient Medications  Medication Sig Dispense Refill   amLODipine (NORVASC) 5 MG tablet Take 1 tablet (5 mg total) by mouth daily. 90 tablet 3   capecitabine (XELODA) 500 MG tablet Take 2 tablets (1000 mg) by mouth in morning and 3 tablets (1500 mg) by mouth in evening. Take within 30 minutes after meals. Take for 14 days on, 7 days off. Repeat every 21 days. 70 tablet 0   gabapentin (NEURONTIN) 100 MG capsule Take 1 capsule (  100 mg total) by mouth 3 (three) times daily as needed. 90 capsule 2   lisinopril-hydrochlorothiazide (ZESTORETIC) 20-12.5 MG tablet Take 2 tablets by mouth daily. 60 tablet 3   Multiple Vitamins-Minerals (MULTIVITAMIN WITH MINERALS) tablet Take 1 tablet by mouth daily. Gummy 50 mg     polyethylene glycol powder (GLYCOLAX/MIRALAX) 17 GM/SCOOP powder Take 1 Container by mouth once.     prochlorperazine (COMPAZINE) 10 MG tablet Take 1 tablet (10 mg total) by mouth  every 6 (six) hours as needed. 30 tablet 2   urea (CARMOL) 10 % cream Apply topically 2 (two) times daily as needed. 85 g 3   No current facility-administered medications for this visit.   Facility-Administered Medications Ordered in Other Visits  Medication Dose Route Frequency Provider Last Rate Last Admin   dexamethasone (DECADRON) 10 mg in sodium chloride 0.9 % 50 mL IVPB  10 mg Intravenous Once [name], MD 204 mL/hr at 06/22/22 1107 10 mg at 06/22/22 1107   oxaliplatin (ELOXATIN) 170 mg in dextrose 5 % 500 mL chemo infusion  110 mg/m2 (Treatment Plan Recorded) Intravenous Once [name], MD        PHYSICAL EXAMINATION: ECOG PERFORMANCE STATUS: 1 - Symptomatic but completely ambulatory  Vitals:   06/22/22 0919  BP: (!) 146/84  Pulse: 69  Resp: 20  Temp: 97.9 F (36.6 C)  SpO2: 100%   Filed Weights   06/22/22 0919  Weight: 128 lb 4.8 oz (58.2 kg)    GENERAL:alert, no distress and comfortable SKIN: Palms dry without erythema.  1 small crack to left hand fingertip EYES: sclera clear LUNGS: normal breathing effort NEURO: alert & oriented x 3 with fluent speech, no focal motor/sensory deficits  LABORATORY DATA:  I have reviewed the data as listed    Latest Ref Rng & Units 06/22/2022    9:06 AM 06/01/2022    9:56 AM 05/17/2022    9:05 AM  CBC  WBC 4.0 - 10.5 K/uL 4.0  5.8  6.0   Hemoglobin 12.0 - 15.0 g/dL 11.7  11.8  12.2   Hematocrit 36.0 - 46.0 % 34.9  35.2  37.1   Platelets 150 - 400 K/uL 131  266  193         Latest Ref Rng & Units 06/22/2022    9:06 AM 06/01/2022    9:56 AM 05/17/2022    9:05 AM  CMP  Glucose 70 - 99 mg/dL 175  111  122   BUN 8 - 23 mg/dL _0 Creatinine 0.44 - 1.00 mg/dL 0.65  0.67  0.82   Sodium 135 - 145 mmol/L 140  139  135   Potassium 3.5 - 5.1 mmol/L 3.3  3.7  3.8   Chloride 98 - 111 mmol/L 105  106  98   CO2 22 - 32 mmol/L 26  27  34   Calcium 8.9 - 10.3 mg/dL 9.1  9.1  8.9   Total Protein 6.5 - 8.1 g/dL 6.6  7.2  7.1    Total Bilirubin 0.3 - 1.2 mg/dL 0.4  0.4  0.5   Alkaline Phos 38 - 126 U/L 53  55  58   AST 15 - 41 U/L _1 ALT 0 - 44 U/L _2 RADIOGRAPHIC STUDIES: I have personally reviewed the radiological images as listed and agreed with the findings in the report. No results found.  ASSESSMENT & PLAN: Sundai Probert is a 71 y.o. female with    1.  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  Colonoscopy 12/01/2020 by Dr. Silverio Decamp showed 2 polyps in the ascending and sigmoid colon, and a large partially obstructing mass in the proximal rectum, 10-18 cm from anal verge. Ascending colon polyp and rectal biopsies both showed adenocarcinoma. Per the report, the ascending colon biopsy was completely retrieved and resected. -Baseline CEA elevated to 19.95 -Staging CT CAP 12/06/2020 showed perirectal lymph node involvement and a single nonspecific left pulmonary nodule.  -Despite strong recommendation she firmly and repeatedly declined IV chemo and surgery.  She agreed to chemo RT with Xeloda which she completed from 01/04/2021 - 02/14/2021 and tolerated well -She declined surveillance recommendations but developed pencil thin and decreased stool caliber, CEA which had normalized after chemo RT increased back to 18.4 on 09/28/2021.  CT showed enlargement of LLL nodule which was hypermetabolic on PET scan 1/61/0960 as well as marked hypermetabolism corresponding to residual rectal wall thickening.  No other evidence of hypermetabolic metastasis -S/p bronchoscopy by Dr. I called 12/13/2021 cytology negative for malignancy, a repeat biopsy was recommended but she declined -CEA continues rising up to 31 on 12/16/2021 -Surveillance colonoscopy 01/31/2022 by Dr. Silverio Decamp showed 10 cm partially obstructing rectal mass at 5-15 cm from  anal verge biopsy confirmed invasive moderately differentiated adenocarcinoma -She agreed to restart Xeloda 02/16/2022; restaging CT 04/17/2022 showed persistent rectal wall thickening and increased size of LLL pulmonary nodule without other new or metastatic disease -After discussion with Congregational nurse we found she had not started Xeloda in August and the progression was not related to treatment failure -She began CapeOx 05/11/2022 with oxaliplatin on day 1 (dose reduced with cycle 1) and Xeloda 1000 mg a.m./1500 mg p.m. on days 1-14 q. 21 days -Ms. Speckman appears stable. S/p 2 cycles CapeOx, she is tolerating moderately well, with jaw tightness, feeling cold, low appetite, and hand-foot syndrome. Side effects are partially managed with supportive care at home.  Able to recover and function most well at home.   -Skin toxicity in her hands is progressing.  We discussed continuing the same dose and increasing topical lotions, versus decreasing the dose and remaining on a 2-week on/1 week off schedule, versus changing to 1 week on/1 week off.  She prefers to switch to 1 week on/1 week off.  Given the current cycle today and will continue for the next 7 days.  Reviewed the schedule and dosing with the patient, her interpreter, and Congregational nurse Jake Michaelis who will help set up the pillbox -Labs reviewed, adequate to proceed with oxaliplatin today as scheduled -I reviewed her foundation one report and discussed with Dr. Burr Medico.  She is K-ras mutation G12A positive, NRAS wild-type; not a candidate for EGFR inhibitor. Dr. Burr Medico recommends adding bevacizumab.  --Chemotherapy consent: Side effects of bevacizumab including but not limited to fatigue, fluid retention, renal dysfunction, proteinuria, elevated blood pressure, bleeding, thrombosis were discussed with patient in great detail. She agrees to proceed. Plan to add next cycle -Proceed with Oxaliplatin today, and continue the current cycle of Xeloda  1000 mg AM/1500 mg PM but switch to 1 week on/ 1 week off. She will start next week of Xeloda 12/21, then should start her week off on 12/28 when she returns for Oxali/bev. -F/up in 3 weeks  2. Symptom Management: constipation, low po intake, nausea, dry hands -rectal bleeding and pain resolved after initial treatment with chemoRT, completed 02/07/21 -other SE's related to CapeOx -I previously encouraged her to start milk of mag on day 1 or 2 after oxaliplatin, not to wait so long, to keep BMs consistent and avoid painful bowel movement. BMs normal on miralax once daily -Hands are dry from dishwashing and Xeloda, I strongly encouraged her to wear gloves and use heavy lotion.  -Hand toxicity is worse, I prescribed urea lotion BID and encouraged her to use other thick lotions every 1-2 hours  -will reduce Xeloda to 1 week on/1 week off   3. Social support -She is here with her husband, moved from [place] in 2014.  Her children remain in [place], spanning ages 6-50.  Her son tried to come during her treatment but was denied by [place] -She does not have a driver's license, she was previously referred to social work for transportation and financial support -Previously reviewed her children's risk for CRC and the need to begin screening by at least age 7.  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  -She has Anice Paganini that help with legal matters. She has help from Congressional nurse, Jake Michaelis who is her first point of contact -Her interpretor Adysen Raphael is her second point of contact.  -I provided a letter of support to help get her son here from [place]   4.  HTN, DM, pre-existing neuropathy -She believes her neuropathy to be related to her job, working long hours on her feet -She has some burning  rectal pain from residual cancer, she can use gabapentin -Denies rectal pain or significant neuropathy today   PLAN: -Labs and Foundation One report reviewed -Increase potassium rich diet -Proceed with cycle 3 Oxaliplatin today, continue Xeloda 1000 mg AM/1500 mg PM but take for 1 week on/1 week off (she started today) Rx: Urea lotion BID, continue other lotions q1-2 hours; use dish washing gloves -F/up in 3 weeks with Oxaliplatin and first Beva -Letter of support provided to help her son come from [place] -Box of Ensure at front desk, per dietician; [name] will pick up while pt in treatment -Restage in January -Plan reviewed with Dr. Burr Medico     Orders Placed This Encounter  Procedures   Total Protein, Urine dipstick    Standing Status:   Standing    Number of Occurrences:   50    Standing Expiration Date:   06/23/2023   All questions were answered. The patient knows to call the clinic with any problems, questions or concerns. No barriers to learning were detected with interpreter present. I spent 25 minutes counseling the patient face to face. The total time spent in the appointment was 40 minutes and more than 50% was on counseling and review of test results, chart review, and coordination of care.      Alla Feeling, NP 06/22/22

## 2022-06-22 NOTE — Addendum Note (Signed)
Addended by: [name] on: 06/22/2022 04:01 PM   Modules accepted: Orders

## 2022-06-22 NOTE — Progress Notes (Signed)
Provided one case Ensure Plus HP

## 2022-06-22 NOTE — Patient Instructions (Signed)
[facility]COL[facility]  Discharge Instructions: Thank you for choosing [facility] to provide your oncology and hematology care.   If you have a lab appointment with the Cancer Center, please go directly to the Cancer Center and check in at the registration area.   Wear comfortable clothing and clothing appropriate for easy access to any Portacath or PICC line.   We strive to give you quality time with your provider. You may need to reschedule your appointment if you arrive late (15 or more minutes).  Arriving late affects you and other patients whose appointments are after yours.  Also, if you miss three or more appointments without notifying the office, you may be dismissed from the clinic at the provider's discretion.      For prescription refill requests, have your pharmacy contact our office and allow 72 hours for refills to be completed.    Today you received the following chemotherapy and/or immunotherapy agents: Oxaliplatin      To help prevent nausea and vomiting after your treatment, we encourage you to take your nausea medication as directed.  BELOW ARE SYMPTOMS THAT SHOULD BE REPORTED IMMEDIATELY: *FEVER GREATER THAN 100.4 F (38 C) OR HIGHER *CHILLS OR SWEATING *NAUSEA AND VOMITING THAT IS NOT CONTROLLED WITH YOUR NAUSEA MEDICATION *UNUSUAL SHORTNESS OF BREATH *UNUSUAL BRUISING OR BLEEDING *URINARY PROBLEMS (pain or burning when urinating, or frequent urination) *BOWEL PROBLEMS (unusual diarrhea, constipation, pain near the anus) TENDERNESS IN MOUTH AND THROAT WITH OR WITHOUT PRESENCE OF ULCERS (sore throat, sores in mouth, or a toothache) UNUSUAL RASH, SWELLING OR PAIN  UNUSUAL VAGINAL DISCHARGE OR ITCHING   Items with * indicate a potential emergency and should be followed up as soon as possible or go to the Emergency Department if any problems should occur.  Please show the CHEMOTHERAPY ALERT CARD or IMMUNOTHERAPY ALERT CARD at check-in  to the Emergency Department and triage nurse.  Should you have questions after your visit or need to cancel or reschedule your appointment, please contact [facility]  Dept: [phone]  and follow the prompts.  Office hours are 8:00 a.m. to 4:30 p.m. Monday - Friday. Please note that voicemails left after 4:00 p.m. may not be returned until the following business day.  We are closed weekends and major holidays. You have access to a nurse at all times for urgent questions. Please call the main number to the clinic Dept: [phone] and follow the prompts.   For any non-urgent questions, you may also contact your provider using MyChart. We now offer e-Visits for anyone 18 and older to request care online for non-urgent symptoms. For details visit [email or web address].   Also download the MyChart app! Go to the app store, search "MyChart", open the app, select [facility], and log in with your MyChart username and password.  Masks are optional in the cancer centers. If you would like for your care team to wear a mask while they are taking care of you, please let them know. You may have one support person who is at least 71 years old accompany you for your appointments. Oxaliplatin Injection What is this medication? OXALIPLATIN (ox AL i PLA tin) treats some types of cancer. It works by slowing down the growth of cancer cells. This medicine may be used for other purposes; ask your health care provider or pharmacist if you have questions. COMMON BRAND NAME(S): Eloxatin What should I tell my care team before I take this medication?   They need to know if you have any of these conditions: Heart disease History of irregular heartbeat or rhythm Liver disease Low blood cell levels (white cells, red cells, and platelets) Lung or breathing disease, such as asthma Take medications that treat or prevent blood clots Tingling of the fingers, toes, or other nerve disorder An  unusual or allergic reaction to oxaliplatin, other medications, foods, dyes, or preservatives If you or your partner are pregnant or trying to get pregnant Breast-feeding How should I use this medication? This medication is injected into a vein. It is given by your care team in a hospital or clinic setting. Talk to your care team about the use of this medication in children. Special care may be needed. Overdosage: If you think you have taken too much of this medicine contact a poison control center or emergency room at once. NOTE: This medicine is only for you. Do not share this medicine with others. What if I miss a dose? Keep appointments for follow-up doses. It is important not to miss a dose. Call your care team if you are unable to keep an appointment. What may interact with this medication? Do not take this medication with any of the following: Cisapride Dronedarone Pimozide Thioridazine This medication may also interact with the following: Aspirin and aspirin-like medications Certain medications that treat or prevent blood clots, such as warfarin, apixaban, dabigatran, and rivaroxaban Cisplatin Cyclosporine Diuretics Medications for infection, such as acyclovir, adefovir, amphotericin B, bacitracin, cidofovir, foscarnet, ganciclovir, gentamicin, pentamidine, vancomycin NSAIDs, medications for pain and inflammation, such as ibuprofen or naproxen Other medications that cause heart rhythm changes Pamidronate Zoledronic acid This list may not describe all possible interactions. Give your health care provider a list of all the medicines, herbs, non-prescription drugs, or dietary supplements you use. Also tell them if you smoke, drink alcohol, or use illegal drugs. Some items may interact with your medicine. What should I watch for while using this medication? Your condition will be monitored carefully while you are receiving this medication. You may need blood work while taking this  medication. This medication may make you feel generally unwell. This is not uncommon as chemotherapy can affect healthy cells as well as cancer cells. Report any side effects. Continue your course of treatment even though you feel ill unless your care team tells you to stop. This medication may increase your risk of getting an infection. Call your care team for advice if you get a fever, chills, sore throat, or other symptoms of a cold or flu. Do not treat yourself. Try to avoid being around people who are sick. Avoid taking medications that contain aspirin, acetaminophen, ibuprofen, naproxen, or ketoprofen unless instructed by your care team. These medications may hide a fever. Be careful brushing or flossing your teeth or using a toothpick because you may get an infection or bleed more easily. If you have any dental work done, tell your dentist you are receiving this medication. This medication can make you more sensitive to cold. Do not drink cold drinks or use ice. Cover exposed skin before coming in contact with cold temperatures or cold objects. When out in cold weather wear warm clothing and cover your mouth and nose to warm the air that goes into your lungs. Tell your care team if you get sensitive to the cold. Talk to your care team if you or your partner are pregnant or think either of you might be pregnant. This medication can cause serious birth defects if taken during   pregnancy and for 9 months after the last dose. A negative pregnancy test is required before starting this medication. A reliable form of contraception is recommended while taking this medication and for 9 months after the last dose. Talk to your care team about effective forms of contraception. Do not father a child while taking this medication and for 6 months after the last dose. Use a condom while having sex during this time period. Do not breastfeed while taking this medication and for 3 months after the last dose. This  medication may cause infertility. Talk to your care team if you are concerned about your fertility. What side effects may I notice from receiving this medication? Side effects that you should report to your care team as soon as possible: Allergic reactions--skin rash, itching, hives, swelling of the face, lips, tongue, or throat Bleeding--bloody or black, tar-like stools, vomiting blood or brown material that looks like coffee grounds, red or dark brown urine, small red or purple spots on skin, unusual bruising or bleeding Dry cough, shortness of breath or trouble breathing Heart rhythm changes--fast or irregular heartbeat, dizziness, feeling faint or lightheaded, chest pain, trouble breathing Infection--fever, chills, cough, sore throat, wounds that don't heal, pain or trouble when passing urine, general feeling of discomfort or being unwell Liver injury--right upper belly pain, loss of appetite, nausea, light-colored stool, dark yellow or brown urine, yellowing skin or eyes, unusual weakness or fatigue Low red blood cell level--unusual weakness or fatigue, dizziness, headache, trouble breathing Muscle injury--unusual weakness or fatigue, muscle pain, dark yellow or brown urine, decrease in amount of urine Pain, tingling, or numbness in the hands or feet Sudden and severe headache, confusion, change in vision, seizures, which may be signs of posterior reversible encephalopathy syndrome (PRES) Unusual bruising or bleeding Side effects that usually do not require medical attention (report to your care team if they continue or are bothersome): Diarrhea Nausea Pain, redness, or swelling with sores inside the mouth or throat Unusual weakness or fatigue Vomiting This list may not describe all possible side effects. Call your doctor for medical advice about side effects. You may report side effects to FDA at 1-800-FDA-1088. Where should I keep my medication? This medication is given in a hospital or  clinic. It will not be stored at home. NOTE: This sheet is a summary. It may not cover all possible information. If you have questions about this medicine, talk to your doctor, pharmacist, or health care provider.  2023 Elsevier/Gold Standard (2007-08-24 00:00:00)  

## 2022-06-26 NOTE — Congregational Nurse Program (Signed)
Home visit to deliver urea 10 lotion which was picked up from [facility].  Advised patient instructions say to use twice a day.  States she is doing well following IV chemo on 06/22/2022.   Jake Michaelis RN, Congregational Nurse [phone]

## 2022-07-04 ENCOUNTER — Other Ambulatory Visit (HOSPITAL_COMMUNITY): Payer: Self-pay

## 2022-07-04 NOTE — Progress Notes (Signed)
The following biosimilar Vegzelma (bevacizumab-adcd)  has been selected for use in this patient.  Kennith Center, Pharm.D., CPP 07/04/2022'@4'$ :19 PM

## 2022-07-11 ENCOUNTER — Other Ambulatory Visit (HOSPITAL_COMMUNITY): Payer: Self-pay

## 2022-07-12 ENCOUNTER — Other Ambulatory Visit: Payer: Medicaid Other

## 2022-07-12 ENCOUNTER — Ambulatory Visit: Payer: Medicaid Other | Admitting: Nurse Practitioner

## 2022-07-12 ENCOUNTER — Ambulatory Visit: Payer: Medicaid Other

## 2022-07-12 MED FILL — Dexamethasone Sodium Phosphate Inj 100 MG/10ML: INTRAMUSCULAR | Qty: 1 | Status: AC

## 2022-07-13 ENCOUNTER — Encounter: Payer: Self-pay | Admitting: Nurse Practitioner

## 2022-07-13 ENCOUNTER — Ambulatory Visit (HOSPITAL_COMMUNITY)
Admission: RE | Admit: 2022-07-13 | Discharge: 2022-07-13 | Disposition: A | Discharge status: Home / Self Care | Payer: Medicaid Other | Source: Ambulatory Visit | Attending: Nurse Practitioner | Admitting: Nurse Practitioner

## 2022-07-13 ENCOUNTER — Inpatient Hospital Stay: Payer: Medicaid Other | Admitting: Dietician

## 2022-07-13 ENCOUNTER — Inpatient Hospital Stay (HOSPITAL_BASED_OUTPATIENT_CLINIC_OR_DEPARTMENT_OTHER): Payer: Medicaid Other | Admitting: Nurse Practitioner

## 2022-07-13 ENCOUNTER — Other Ambulatory Visit: Payer: Self-pay

## 2022-07-13 ENCOUNTER — Inpatient Hospital Stay: Payer: Medicaid Other

## 2022-07-13 ENCOUNTER — Other Ambulatory Visit (HOSPITAL_COMMUNITY): Payer: Self-pay

## 2022-07-13 ENCOUNTER — Ambulatory Visit: Payer: Medicaid Other

## 2022-07-13 VITALS — BP 147/76 | HR 64 | Resp 18

## 2022-07-13 VITALS — BP 143/89 | HR 89 | Temp 98.5°F | Resp 18 | Wt 124.9 lb

## 2022-07-13 DIAGNOSIS — I82402 Acute embolism and thrombosis of unspecified deep veins of left lower extremity: Secondary | ICD-10-CM

## 2022-07-13 DIAGNOSIS — C20 Malignant neoplasm of rectum: Secondary | ICD-10-CM | POA: Diagnosis not present

## 2022-07-13 LAB — CMP (CANCER CENTER ONLY)
ALT: 5 U/L (ref 0–44)
AST: 20 U/L (ref 15–41)
Albumin: 3.2 g/dL — ABNORMAL LOW (ref 3.5–5.0)
Alkaline Phosphatase: 60 U/L (ref 38–126)
Anion gap: 4 — ABNORMAL LOW (ref 5–15)
BUN: 11 mg/dL (ref 8–23)
CO2: 29 mmol/L (ref 22–32)
Calcium: 8.8 mg/dL — ABNORMAL LOW (ref 8.9–10.3)
Chloride: 105 mmol/L (ref 98–111)
Creatinine: 0.61 mg/dL (ref 0.44–1.00)
GFR, Estimated: >60 mL/min (ref >60)
Glucose, Bld: 147 mg/dL — ABNORMAL HIGH (ref 70–99)
Potassium: 3.7 mmol/L (ref 3.5–5.1)
Sodium: 138 mmol/L (ref 135–145)
Total Bilirubin: 0.4 mg/dL (ref 0.3–1.2)
Total Protein: 6.6 g/dL (ref 6.5–8.1)

## 2022-07-13 LAB — CBC WITH DIFFERENTIAL (CANCER CENTER ONLY)
Abs Immature Granulocytes: 0.01 10*3/uL (ref 0.00–0.07)
Basophils Absolute: 0 10*3/uL (ref 0.0–0.1)
Basophils Relative: 1 %
Eosinophils Absolute: 0.1 10*3/uL (ref 0.0–0.5)
Eosinophils Relative: 3 %
HCT: 32.4 % — ABNORMAL LOW (ref 36.0–46.0)
Hemoglobin: 11.1 g/dL — ABNORMAL LOW (ref 12.0–15.0)
Immature Granulocytes: 0 %
Lymphocytes Relative: 25 %
Lymphs Abs: 0.7 10*3/uL (ref 0.7–4.0)
MCH: 28.2 pg (ref 26.0–34.0)
MCHC: 34.3 g/dL (ref 30.0–36.0)
MCV: 82.2 fL (ref 80.0–100.0)
Monocytes Absolute: 0.5 10*3/uL (ref 0.1–1.0)
Monocytes Relative: 18 %
Neutro Abs: 1.6 10*3/uL — ABNORMAL LOW (ref 1.7–7.7)
Neutrophils Relative %: 53 %
Platelet Count: 151 10*3/uL (ref 150–400)
RBC: 3.94 MIL/uL (ref 3.87–5.11)
RDW: 19.8 % — ABNORMAL HIGH (ref 11.5–15.5)
WBC Count: 3 10*3/uL — ABNORMAL LOW (ref 4.0–10.5)
nRBC: 0 % (ref 0.0–0.2)

## 2022-07-13 LAB — TOTAL PROTEIN, URINE DIPSTICK: Protein, ur: NEGATIVE mg/dL

## 2022-07-13 MED ORDER — OXALIPLATIN CHEMO INJECTION 100 MG/20ML
85.0000 mg/m2 | Freq: Once | INTRAVENOUS | Status: AC
  Administered 2022-07-13: 130 mg via INTRAVENOUS
  Filled 2022-07-13: qty 26

## 2022-07-13 MED ORDER — DEXTROSE 5 % IV SOLN: Freq: Once | INTRAVENOUS | Status: AC

## 2022-07-13 MED ORDER — SODIUM CHLORIDE 0.9 % IV SOLN
10.0000 mg | Freq: Once | INTRAVENOUS | Status: AC
  Administered 2022-07-13: 10 mg via INTRAVENOUS
  Filled 2022-07-13: qty 10

## 2022-07-13 MED ORDER — RIVAROXABAN (XARELTO) VTE STARTER PACK (15 & 20 MG)
ORAL_TABLET | ORAL | 0 refills | Status: DC
  Filled 2022-07-13: qty 51, 30d supply, fill #0

## 2022-07-13 MED ORDER — PALONOSETRON HCL INJECTION 0.25 MG/5ML
0.2500 mg | Freq: Once | INTRAVENOUS | Status: AC
  Administered 2022-07-13: 0.25 mg via INTRAVENOUS
  Filled 2022-07-13: qty 5

## 2022-07-13 NOTE — Congregational Nurse Program (Signed)
Accompanied patient to appointment with Cira Rue NP at [facility]. Patient stated she is out of Miralax and [place] since yesterday.  CN picked both up from [facility] and took them to patient during her chemo infusion.  CN will schedule transportation for next appointment on 08/03/2022 at 9:00 am and also for C-T scan which will be prior to that appointment.  Jake Michaelis RN, Congregational Nurse [phone]

## 2022-07-13 NOTE — Progress Notes (Signed)
Nutrition Follow-up:  Patient with rectal cancer diagnosed 12/21/20. Patient completed concurrent chemoradiation with Dr. Lisbeth Renshaw (01/04/21-02/11/21). She is currently receiving Capeox q21d (first 10/26).   Met with patient during infusion. Interpretor present at visit today. Patient reports decreased appetite. Nothing sounds good to her. Patient recalls eating mostly broth with rice. She is drinking 2 Ensure, one in the morning and one before bed. Patient reports she has stopped eating meats because this feeds her cancer. She saw this on YouTube. Patient denies nausea, vomiting, diarrhea, constipation.   Medications: reviewed   Labs: glucose 147, albumin 3.2, Hgb 11.1  Anthropometrics: Weight 124 lb 14.4 oz today decreased 3% in 3 weeks - concerning  12/7 - 128 lb 4.8 oz   NUTRITION DIAGNOSIS: Food and nutrition related knowledge deficit continues    INTERVENTION:  Reinforced education on adequate calories and protein to maintain strength/weights Encouraged pt to eat lean proteins including chicken, fish, [place] Continue drinking Ensure Plus/equivalent - recommend increasing to 3x/day One complimentary case of Ensure Plus HP provided     MONITORING, EVALUATION, GOAL: weight trends, intake   NEXT VISIT: Thursday January 18 during infusion

## 2022-07-13 NOTE — Progress Notes (Signed)
Reduce oxaliplatin dose to 85 mg/m2 per NP [name].  Larene Beach, PharmD

## 2022-07-13 NOTE — Progress Notes (Signed)
Bilateral lower extremity venous duplex has been completed. Preliminary results can be found in CV Proc through chart review.  Results were given to Cira Rue NP.  07/13/22 3:26 PM Carlos Levering RVT

## 2022-07-13 NOTE — Progress Notes (Signed)
Per Regan Rakers, NP, dose of oxaliplatin will be reduced to '85mg'$ /m2 today and bevacizumab will not be given.  Laray Anger, PharmD PGY-2 Pharmacy Resident Hematology/Oncology [phone]  07/13/2022 11:51 AM

## 2022-07-13 NOTE — Progress Notes (Addendum)
[place]   [phone] Fax:([phone]   Clinic Follow up Note   Patient Care Team: Gaylan Gerold, DO as PCP - General (Internal Medicine) Jonnie Finner, RN (Inactive) as Oncology Nurse Navigator Alla Feeling, NP as Nurse Practitioner (Nurse Practitioner) [name], MD as Consulting Physician (Hematology and Oncology) Kyung Rudd, MD as Consulting Physician (Radiation Oncology) [name], Telford Nab., MD as Consulting Physician (Gastroenterology) Garner Nash, DO as Consulting Physician (Pulmonary Disease) Michael Boston, MD as Consulting Physician (General Surgery) 07/13/2022  CHIEF COMPLAINT: Follow up colon and rectal cancer   SUMMARY OF ONCOLOGIC HISTORY: Oncology History Overview Note  Cancer Staging Rectal adenocarcinoma [facility]) Staging form: Colon and Rectum, AJCC 8th Edition - Clinical stage from 12/21/2020: Stage IIIB (cT3, cN1, cM0) - Unsigned    Rectal adenocarcinoma ([place])  08/26/2020 Miscellaneous   Initial presentation to PCP, reporting intermittent BRBPR since 02/2020    12/01/2020 Procedure   Colonoscopy by Dr. Silverio Decamp findings - The perianal and digital rectal examinations were normal. - A 15 mm polyp was found in the ascending colon. The polyp was semi-pedunculated. Resection and retrieval were complete. - A 25 mm polyp was found in the sigmoid colon. The polyp was pedunculated. Resection and retrieval were complete. - An infiltrative partially obstructing large mass was found in the proximal rectum. The mass was partially circumferential (involving one-half of the lumen circumference). The mass measured eight cm in length extending from 10 to 18cm from anal verge. This was biopsied with a cold forceps for histology. Proximal and distal opposite fold area of the mass lesion was tattooed with an injection of total 3 mL of Spot (carbon black).   12/01/2020 Initial Biopsy   Diagnosis 1. 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 SEE NOTE 2. Colon, sigmoid polyp, x1 - TUBULOVILLOUS ADENOMA(S) - NEGATIVE FOR HIGH-GRADE DYSPLASIA OR MALIGNANCY 3. Rectum, biopsy - ADENOCARCINOMA. SEE NOTE   12/01/2020 Cancer Staging   Cancer Staging Rectal adenocarcinoma [facility]) Staging form: Colon and Rectum, AJCC 8th Edition - Clinical stage from 12/21/2020: Stage IIIB (cT3, cN1, cM0) - Unsigned    12/06/2020 Imaging   CT CAP with contrast IMPRESSION: 1. There is partially circumferential soft tissue thickening of the mid to superior rectum, approximately 5 cm in length and the inferior extent approximately 6 cm above the anal verge, consistent with primary rectal malignancy identified by colonoscopy. 2. There appear to be abnormally enlarged perirectal lymph nodes posteriorly about the superior rectum measuring up to 1.0 x 0.8 cm. Findings are suspicious for perirectal nodal metastatic disease, however rectal MRI is the test of choice for initial local staging of rectal cancer. 3. There is a 4 mm nonspecific pulmonary nodule of the superior segment left lower lobe, statistically most likely incidental, infectious or inflammatory, although nonspecific and isolated metastatic disease is not strictly excluded. Attention on follow-up. 4. No other evidence of metastatic disease in the chest, abdomen, or pelvis. Aortic Atherosclerosis (ICD10-I70.0).   12/09/2020 Imaging   Local staging MRI pelvis without contrast IMPRESSION: 4.9 cm circumferential mid/lower rectal mass, corresponding to the patient's newly diagnosed rectal cancer. Rectal adenocarcinoma T stage: T3c Rectal adenocarcinoma N stage:  N1 Distance from tumor to the internal anal sphincter is 3.7 cm.   12/21/2020 Initial Diagnosis   Rectal adenocarcinoma ([place])   01/04/2021 -  Chemotherapy   Concurrent chemoradiation with Xeloda 1021m in the AM and 15011min the PM on days of Radiation  starting  01/04/21.       01/04/2021 - 02/11/2021 Radiation Therapy   Concurrent chemoradiation with Dr Lisbeth Renshaw and Xeloda starting 01/04/21.    01/31/2022 Procedure   Colonoscopy, Dr. Silverio Decamp  Findings: - An infiltrative partially obstructing large mass was found in the rectum. The mass was circumferential. The mass measured ten cm in length, extending from 5-15cm from anal verge. In addition, rectal diameter at narrowest approx twelve mm. Oozing was present. Biopsies were taken with a cold forceps for histology.  Impression: - Hemorrhoids found on perianal exam. - Likely malignant partially obstructing tumor in the rectum. Biopsied. - Non-bleeding external and internal hemorrhoids.   01/31/2022 Pathology Results   Diagnosis Rectum, biopsy INVASIVE MODERATELY DIFFERENTIATED ADENOCARCINOMA   05/11/2022 -  Chemotherapy   Patient is on Treatment Plan : COLORECTAL Xelox (Capeox)(130/850) q21d       CURRENT THERAPY:  CAPEOX, q21d, starting 05/11/22             -Xeloda dose: 1069m AM, 15057mPM, days 1-14  -Xeloda changed to one week on/one week off starting 06/22/22 due to hand foot syndrome   INTERVAL HISTORY: Ms. NiPenningseturns for follow up as scheduled. Last seen by me 06/22/22. She continues Xeloda 1 week on/1 week off, skin cracks about the same but manageable. She is having more difficulty with chemo, longer recovery. Week 2 after last oxali she developed fatigue, low appetite, and tightness in her lower legs making it difficult to walk. Denies injury, redness, warmth, swelling. She has pain in the right buttock and bilateral groin as well. Denies incontinence or saddle numbness. Mobility is decreased due to pain but wasn't before this started. Also since last treatment every day from 4 to 8 am she has chills, no fever. Denies cough, chest pain, dyspnea.    REVIEW OF SYSTEMS:   All other systems were reviewed with the patient and are negative.  MEDICAL HISTORY:  Past Medical History:   Diagnosis Date   Bilateral wrist pain 10/25/2017   Hypertension    Neck mass 1998   Unknown biopsy results. [place]ised [place]  Pre-diabetes    Rectal cancer ([facility]   Trigger finger, acquired 02/08/2017    SURGICAL HISTORY: Past Surgical History:  Procedure Laterality Date   BRONCHIAL BIOPSY  12/13/2021   Procedure: BRONCHIAL BIOPSIES;  Surgeon: IcGarner NashDO;  [facility];  Service: Pulmonary;;   BRONCHIAL NEEDLE ASPIRATION BIOPSY  12/13/2021   Procedure: BRONCHIAL NEEDLE ASPIRATION BIOPSIES;  Surgeon: IcGarner NashDO;  [facility];  Service: Pulmonary;;   NECK SURGERY Left 1998   Excision of mass in [place] VIDEO BRONCHOSCOPY WITH RADIAL ENDOBRONCHIAL ULTRASOUND  12/13/2021   Procedure: RADIAL ENDOBRONCHIAL ULTRASOUND;  Surgeon: IcGarner NashDO;  [facility];  Service: Pulmonary;;    I have reviewed the social history and family history with the patient and they are unchanged from previous note.  ALLERGIES:  is allergic to no known allergies.  MEDICATIONS:  Current Outpatient Medications  Medication Sig Dispense Refill   amLODipine (NORVASC) 5 MG tablet Take 1 tablet (5 mg total) by mouth daily. 90 tablet 3   capecitabine (XELODA) 500 MG tablet Take 2 tablets (1000 mg) by mouth in morning and 3 tablets (1500 mg) by mouth in evening. Take within 30 minutes after meals. Take for 14 days on, 7 days off. Repeat every 21 days. 70 tablet 0   gabapentin (NEURONTIN) 100 MG capsule Take 1 capsule (  100 mg total) by mouth 3 (three) times daily as needed. 90 capsule 2   lisinopril-hydrochlorothiazide (ZESTORETIC) 20-12.5 MG tablet Take 2 tablets by mouth daily. 60 tablet 3   Multiple Vitamins-Minerals (MULTIVITAMIN WITH MINERALS) tablet Take 1 tablet by mouth daily. Gummy 50 mg     polyethylene glycol powder (GLYCOLAX/MIRALAX) 17 GM/SCOOP powder Take 1 Container by mouth once.     prochlorperazine (COMPAZINE) 10 MG tablet Take 1 tablet  (10 mg total) by mouth every 6 (six) hours as needed. 30 tablet 2   urea (CARMOL) 10 % cream Apply topically 2 times daily as needed. 85 g 3   No current facility-administered medications for this visit.   Facility-Administered Medications Ordered in Other Visits  Medication Dose Route Frequency Provider Last Rate Last Admin   oxaliplatin (ELOXATIN) 130 mg in dextrose 5 % 500 mL chemo infusion  85 mg/m2 (Treatment Plan Recorded) Intravenous Once Alla Feeling, NP 263 mL/hr at 07/13/22 1249 130 mg at 07/13/22 1249    PHYSICAL EXAMINATION: ECOG PERFORMANCE STATUS: 1 - Symptomatic but completely ambulatory  Vitals:   07/13/22 1047  BP: (!) 143/89  Pulse: 72  Resp: 18  Temp: 98.5 F (36.9 C)  SpO2: 100%   Filed Weights   07/13/22 1047  Weight: 124 lb 14.4 oz (56.7 kg)    GENERAL:alert, no distress and comfortable SKIN: no rash. Palms dry with some cracks at the distal fingertips. No erythema  EYES: sclera clear LUNGS: clear with normal breathing effort HEART: regular rate & rhythm, tenderness in the lower legs without edema or erythema  ABDOMEN:abdomen soft, non-tender and normal bowel sounds NEURO: alert & oriented x 3 with fluent speech, generalized weakness   LABORATORY DATA:  I have reviewed the data as listed    Latest Ref Rng & Units 07/13/2022    9:34 AM 06/22/2022    9:06 AM 06/01/2022    9:56 AM  CBC  WBC 4.0 - 10.5 K/uL 3.0  4.0  5.8   Hemoglobin 12.0 - 15.0 g/dL 11.1  11.7  11.8   Hematocrit 36.0 - 46.0 % 32.4  34.9  35.2   Platelets 150 - 400 K/uL 151  131  266         Latest Ref Rng & Units 07/13/2022    9:34 AM 06/22/2022    9:06 AM 06/01/2022    9:56 AM  CMP  Glucose 70 - 99 mg/dL 147  175  111   BUN 8 - 23 mg/dL _0 Creatinine 0.44 - 1.00 mg/dL 0.61  0.65  0.67   Sodium 135 - 145 mmol/L 138  140  139   Potassium 3.5 - 5.1 mmol/L 3.7  3.3  3.7   Chloride 98 - 111 mmol/L 105  105  106   CO2 22 - 32 mmol/L _1 Calcium 8.9 -  10.3 mg/dL 8.8  9.1  9.1   Total Protein 6.5 - 8.1 g/dL 6.6  6.6  7.2   Total Bilirubin 0.3 - 1.2 mg/dL 0.4  0.4  0.4   Alkaline Phos 38 - 126 U/L 60  53  55   AST 15 - 41 U/L _2 ALT 0 - 44 U/L _3 RADIOGRAPHIC STUDIES: I have personally reviewed the radiological images as listed and agreed with the findings in the report. No results found.  ASSESSMENT & PLAN: Annica Marinello is a 71 y.o. female with    1.  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  Colonoscopy 12/01/2020 by Dr. Silverio Decamp showed 2 polyps in the ascending and sigmoid colon, and a large partially obstructing mass in the proximal rectum, 10-18 cm from anal verge. Ascending colon polyp and rectal biopsies both showed adenocarcinoma. Per the report, the ascending colon biopsy was completely retrieved and resected. -Baseline CEA elevated to 19.95 -Staging CT CAP 12/06/2020 showed perirectal lymph node involvement and a single nonspecific left pulmonary nodule.  -Despite strong recommendation she firmly and repeatedly declined IV chemo and surgery.  She agreed to chemo RT with Xeloda which she completed from 01/04/2021 - 02/14/2021 and tolerated well -She declined surveillance recommendations but developed pencil thin and decreased stool caliber, CEA which had normalized after chemo RT increased back to 18.4 on 09/28/2021.  CT showed enlargement of LLL nodule which was hypermetabolic on PET scan 8/82/8003 as well as marked hypermetabolism corresponding to residual rectal wall thickening.  No other evidence of hypermetabolic metastasis -S/p bronchoscopy by Dr. I called 12/13/2021 cytology negative for malignancy, a repeat biopsy was recommended but she declined -CEA continues rising up to 31 on 12/16/2021 -Surveillance colonoscopy 01/31/2022 by Dr.  Silverio Decamp showed 10 cm partially obstructing rectal mass at 5-15 cm from anal verge biopsy confirmed invasive moderately differentiated adenocarcinoma -She agreed to restart Xeloda 02/16/2022; restaging CT 04/17/2022 showed persistent rectal wall thickening and increased size of LLL pulmonary nodule without other new or metastatic disease -After discussion with [facility] nurse we found she had not started Xeloda in August and the progression was not related to treatment failure -She began CapeOx 05/11/2022 with oxaliplatin on day 1 (dose reduced with cycle 1) and Xeloda 1000 mg a.m./1500 mg p.m. on days 1-14 q. 21 days -with cycle 3 she tolerated moderately well, with jaw tightness, feeling cold, low appetite, and worsening hand-foot syndrome. We switched Xeloda to 1 week on/1 week off -foundation one report shows K-ras mutation G12A positive, NRAS wild-type; not a candidate for EGFR inhibitor.She consented to bevacizumab but has not started yet.  -Ms. Alvidrez appears weak but stable. S/p cycle 3 CapeOx she is not tolerating well, with leg tightness limiting mobility.  She still has chills and low appetite. Hand/foot syndrome appears stable on 1 week on/1 week off dosing. -Question if leg tightness is presentation of neuro toxicity. Will r/o DVT, although my suspicion is low. Will hold bevacizumab today until we rule out. -We discussed treatment, she is willing to continue but is requesting oxali dose reduction. She tolerated cycle 1 well, will reduce back to oxali 85 mg/m2 -She is due for restaging, will arrange before next cycle.  -Continue Xeloda, she began her week off today -F/up in 3 weeks with next cycle    2. Symptom Management: constipation, low po intake, nausea, dry hands -rectal bleeding and pain resolved after initial treatment with chemoRT, completed 02/07/21 -other SE's related to CapeOx -I previously encouraged her to start milk of mag on day 1 or 2 after oxaliplatin, not to wait so long,  to keep BMs consistent and avoid painful bowel movement. BMs normal on miralax once daily -Hands are dry from dishwashing and Xeloda, I strongly encouraged her to wear gloves and use heavy lotion. I prescribed urea lotion BID and encouraged her to use  other thick lotions every 1-2 hours  -will reduce Xeloda to 1 week on/1 week off, stable on this dose   3. Social support -She is here with her husband, moved from [place] in 2014.  Her children remain in [place], spanning ages 58-50.  Her son tried to come during her treatment but was denied by [place] -She does not have a driver's license, she was previously referred to social work for transportation and financial support -Previously reviewed her children's risk for CRC and the need to begin screening by at least age 43.  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  -She has Anice Paganini that help with legal matters. She has help from [facility] nurse, Jake Michaelis who is her first point of contact -Her interpretor Anishka Bushard is her second point of contact.  -I recently provided a letter of support to help get her son here from [place]   4.  HTN, DM, pre-existing neuropathy -She believes her neuropathy to be related to her job, working long hours on her feet -She has some burning rectal pain from residual cancer, she can use gabapentin -Denies rectal pain today   PLAN: -Labs reviewed -Proceed with Oxaliplatin today, dose reduced to 85 mg/m2 -Begin next Xeloda cycle 1/4, continue 1 week on/1 week off  -Hold beva, doppler today to r/o DVT -Restaging CT in 2-3 weeks, ordered -F/up and CT review, next cycle in 3 weeks   Orders Placed This Encounter  Procedures   CT CHEST ABDOMEN PELVIS W CONTRAST    Standing Status:   Future    Standing Expiration  Date:   07/14/2023    Order Specific Question:   If indicated for the ordered procedure, I authorize the administration of contrast media per Radiology protocol    Answer:   Yes    Order Specific Question:   Does the patient have a contrast media/X-ray dye allergy?    Answer:   No    Order Specific Question:   Preferred imaging location?    Answer:   [facility]    Order Specific Question:   Is Oral Contrast requested for this exam?    Answer:   Yes, Per Radiology protocol   All questions were answered. The patient knows to call the clinic with any problems, questions or concerns. No barriers to learning was detected. I spent 20 minutes counseling the patient face to face. The total time spent in the appointment was 30 minutes and more than 50% was on counseling and review of test results, and coordination of care.      Alla Feeling, NP 07/13/22     Addendum: Doppler found acute DVT involving the left popliteal and peroneal veins. R leg negative. No hypoxia or tachycardia on ambulation. We discussed management. Given multiple veins involved, I recommend ED evaluation for full work up and to r/o PE. After long discussion, and her fear of admission and not having anyone to take care of her husband at home who has alzheimer's, she declined to go to ED. She agrees to start Xarelto tonight and will come back for further work up tomorrow. If CTA can not be done outpatient first thing, she will come to ED 12/29. She understands the risks of not going to ED tonight.

## 2022-07-13 NOTE — Patient Instructions (Signed)
[facility]ICAL [facility]COL[facility]  Discharge Instructions: Thank you for choosing [facility] to provide your oncology and hematology care.   If you have a lab appointment with the Cancer Center, please go directly to the Cancer Center and check in at the registration area.   Wear comfortable clothing and clothing appropriate for easy access to any Portacath or PICC line.   We strive to give you quality time with your provider. You may need to reschedule your appointment if you arrive late (15 or more minutes).  Arriving late affects you and other patients whose appointments are after yours.  Also, if you miss three or more appointments without notifying the office, you may be dismissed from the clinic at the provider's discretion.      For prescription refill requests, have your pharmacy contact our office and allow 72 hours for refills to be completed.    Today you received the following chemotherapy and/or immunotherapy agents: Oxaliplatin      To help prevent nausea and vomiting after your treatment, we encourage you to take your nausea medication as directed.  BELOW ARE SYMPTOMS THAT SHOULD BE REPORTED IMMEDIATELY: *FEVER GREATER THAN 100.4 F (38 C) OR HIGHER *CHILLS OR SWEATING *NAUSEA AND VOMITING THAT IS NOT CONTROLLED WITH YOUR NAUSEA MEDICATION *UNUSUAL SHORTNESS OF BREATH *UNUSUAL BRUISING OR BLEEDING *URINARY PROBLEMS (pain or burning when urinating, or frequent urination) *BOWEL PROBLEMS (unusual diarrhea, constipation, pain near the anus) TENDERNESS IN MOUTH AND THROAT WITH OR WITHOUT PRESENCE OF ULCERS (sore throat, sores in mouth, or a toothache) UNUSUAL RASH, SWELLING OR PAIN  UNUSUAL VAGINAL DISCHARGE OR ITCHING   Items with * indicate a potential emergency and should be followed up as soon as possible or go to the Emergency Department if any problems should occur.  Please show the CHEMOTHERAPY ALERT CARD or IMMUNOTHERAPY ALERT CARD at check-in  to the Emergency Department and triage nurse.  Should you have questions after your visit or need to cancel or reschedule your appointment, please contact [facility]  Dept: [phone]  and follow the prompts.  Office hours are 8:00 a.m. to 4:30 p.m. Monday - Friday. Please note that voicemails left after 4:00 p.m. may not be returned until the following business day.  We are closed weekends and major holidays. You have access to a nurse at all times for urgent questions. Please call the main number to the clinic Dept: [phone] and follow the prompts.   For any non-urgent questions, you may also contact your provider using MyChart. We now offer e-Visits for anyone 18 and older to request care online for non-urgent symptoms. For details visit [email or web address].   Also download the MyChart app! Go to the app store, search "MyChart", open the app, select [facility], and log in with your MyChart username and password.  Masks are optional in the cancer centers. If you would like for your care team to wear a mask while they are taking care of you, please let them know. You may have one support person who is at least 71 years old accompany you for your appointments. Oxaliplatin Injection What is this medication? OXALIPLATIN (ox AL i PLA tin) treats some types of cancer. It works by slowing down the growth of cancer cells. This medicine may be used for other purposes; ask your health care provider or pharmacist if you have questions. COMMON BRAND NAME(S): Eloxatin What should I tell my care team before I take this medication?   They need to know if you have any of these conditions: Heart disease History of irregular heartbeat or rhythm Liver disease Low blood cell levels (white cells, red cells, and platelets) Lung or breathing disease, such as asthma Take medications that treat or prevent blood clots Tingling of the fingers, toes, or other nerve disorder An  unusual or allergic reaction to oxaliplatin, other medications, foods, dyes, or preservatives If you or your partner are pregnant or trying to get pregnant Breast-feeding How should I use this medication? This medication is injected into a vein. It is given by your care team in a hospital or clinic setting. Talk to your care team about the use of this medication in children. Special care may be needed. Overdosage: If you think you have taken too much of this medicine contact a poison control center or emergency room at once. NOTE: This medicine is only for you. Do not share this medicine with others. What if I miss a dose? Keep appointments for follow-up doses. It is important not to miss a dose. Call your care team if you are unable to keep an appointment. What may interact with this medication? Do not take this medication with any of the following: Cisapride Dronedarone Pimozide Thioridazine This medication may also interact with the following: Aspirin and aspirin-like medications Certain medications that treat or prevent blood clots, such as warfarin, apixaban, dabigatran, and rivaroxaban Cisplatin Cyclosporine Diuretics Medications for infection, such as acyclovir, adefovir, amphotericin B, bacitracin, cidofovir, foscarnet, ganciclovir, gentamicin, pentamidine, vancomycin NSAIDs, medications for pain and inflammation, such as ibuprofen or naproxen Other medications that cause heart rhythm changes Pamidronate Zoledronic acid This list may not describe all possible interactions. Give your health care provider a list of all the medicines, herbs, non-prescription drugs, or dietary supplements you use. Also tell them if you smoke, drink alcohol, or use illegal drugs. Some items may interact with your medicine. What should I watch for while using this medication? Your condition will be monitored carefully while you are receiving this medication. You may need blood work while taking this  medication. This medication may make you feel generally unwell. This is not uncommon as chemotherapy can affect healthy cells as well as cancer cells. Report any side effects. Continue your course of treatment even though you feel ill unless your care team tells you to stop. This medication may increase your risk of getting an infection. Call your care team for advice if you get a fever, chills, sore throat, or other symptoms of a cold or flu. Do not treat yourself. Try to avoid being around people who are sick. Avoid taking medications that contain aspirin, acetaminophen, ibuprofen, naproxen, or ketoprofen unless instructed by your care team. These medications may hide a fever. Be careful brushing or flossing your teeth or using a toothpick because you may get an infection or bleed more easily. If you have any dental work done, tell your dentist you are receiving this medication. This medication can make you more sensitive to cold. Do not drink cold drinks or use ice. Cover exposed skin before coming in contact with cold temperatures or cold objects. When out in cold weather wear warm clothing and cover your mouth and nose to warm the air that goes into your lungs. Tell your care team if you get sensitive to the cold. Talk to your care team if you or your partner are pregnant or think either of you might be pregnant. This medication can cause serious birth defects if taken during   pregnancy and for 9 months after the last dose. A negative pregnancy test is required before starting this medication. A reliable form of contraception is recommended while taking this medication and for 9 months after the last dose. Talk to your care team about effective forms of contraception. Do not father a child while taking this medication and for 6 months after the last dose. Use a condom while having sex during this time period. Do not breastfeed while taking this medication and for 3 months after the last dose. This  medication may cause infertility. Talk to your care team if you are concerned about your fertility. What side effects may I notice from receiving this medication? Side effects that you should report to your care team as soon as possible: Allergic reactions--skin rash, itching, hives, swelling of the face, lips, tongue, or throat Bleeding--bloody or black, tar-like stools, vomiting blood or brown material that looks like coffee grounds, red or dark brown urine, small red or purple spots on skin, unusual bruising or bleeding Dry cough, shortness of breath or trouble breathing Heart rhythm changes--fast or irregular heartbeat, dizziness, feeling faint or lightheaded, chest pain, trouble breathing Infection--fever, chills, cough, sore throat, wounds that don't heal, pain or trouble when passing urine, general feeling of discomfort or being unwell Liver injury--right upper belly pain, loss of appetite, nausea, light-colored stool, dark yellow or brown urine, yellowing skin or eyes, unusual weakness or fatigue Low red blood cell level--unusual weakness or fatigue, dizziness, headache, trouble breathing Muscle injury--unusual weakness or fatigue, muscle pain, dark yellow or brown urine, decrease in amount of urine Pain, tingling, or numbness in the hands or feet Sudden and severe headache, confusion, change in vision, seizures, which may be signs of posterior reversible encephalopathy syndrome (PRES) Unusual bruising or bleeding Side effects that usually do not require medical attention (report to your care team if they continue or are bothersome): Diarrhea Nausea Pain, redness, or swelling with sores inside the mouth or throat Unusual weakness or fatigue Vomiting This list may not describe all possible side effects. Call your doctor for medical advice about side effects. You may report side effects to FDA at 1-800-FDA-1088. Where should I keep my medication? This medication is given in a hospital or  clinic. It will not be stored at home. NOTE: This sheet is a summary. It may not cover all possible information. If you have questions about this medicine, talk to your doctor, pharmacist, or health care provider.  2023 Elsevier/Gold Standard (2007-08-24 00:00:00)  

## 2022-07-13 NOTE — Addendum Note (Signed)
Addended by: Alla Feeling on: 07/13/2022 04:27 PM   Modules accepted: Orders

## 2022-07-14 ENCOUNTER — Ambulatory Visit (HOSPITAL_COMMUNITY)
Admission: RE | Admit: 2022-07-14 | Discharge: 2022-07-14 | Disposition: A | Discharge status: Home / Self Care | Payer: Medicaid Other | Source: Ambulatory Visit | Attending: Nurse Practitioner | Admitting: Nurse Practitioner

## 2022-07-14 ENCOUNTER — Ambulatory Visit (HOSPITAL_COMMUNITY): Payer: Medicaid Other

## 2022-07-14 ENCOUNTER — Telehealth: Payer: Self-pay | Admitting: Hematology

## 2022-07-14 ENCOUNTER — Telehealth: Payer: Self-pay

## 2022-07-14 ENCOUNTER — Other Ambulatory Visit (HOSPITAL_COMMUNITY): Payer: Self-pay

## 2022-07-14 ENCOUNTER — Other Ambulatory Visit: Payer: Self-pay | Admitting: Hematology

## 2022-07-14 DIAGNOSIS — J8410 Pulmonary fibrosis, unspecified: Secondary | ICD-10-CM | POA: Diagnosis not present

## 2022-07-14 DIAGNOSIS — I82402 Acute embolism and thrombosis of unspecified deep veins of left lower extremity: Secondary | ICD-10-CM | POA: Diagnosis present

## 2022-07-14 DIAGNOSIS — C20 Malignant neoplasm of rectum: Secondary | ICD-10-CM | POA: Diagnosis not present

## 2022-07-14 DIAGNOSIS — R188 Other ascites: Secondary | ICD-10-CM | POA: Diagnosis not present

## 2022-07-14 DIAGNOSIS — R911 Solitary pulmonary nodule: Secondary | ICD-10-CM | POA: Diagnosis not present

## 2022-07-14 MED ORDER — IOHEXOL 9 MG/ML PO SOLN
ORAL | Status: AC
  Filled 2022-07-14: qty 1000

## 2022-07-14 MED ORDER — IOHEXOL 9 MG/ML PO SOLN: 1000.0000 mL | ORAL | Status: AC

## 2022-07-14 MED ORDER — SODIUM CHLORIDE (PF) 0.9 % IJ SOLN
INTRAMUSCULAR | Status: AC
  Filled 2022-07-14: qty 50

## 2022-07-14 MED ORDER — IOHEXOL 350 MG/ML SOLN
80.0000 mL | Freq: Once | INTRAVENOUS | Status: AC | PRN
  Administered 2022-07-14: 80 mL via INTRAVENOUS

## 2022-07-14 NOTE — Telephone Encounter (Signed)
Called Tawny Hopping to relay message about results of scan he understood and will relay message to patient.     Please let patient know she does not have a PE, CTA was negative. Make sure she has started xarelto. Thanks!

## 2022-07-14 NOTE — Telephone Encounter (Signed)
Called patient to inform about upcoming appointments.unable to leave a voicemail not set up.

## 2022-07-15 ENCOUNTER — Encounter: Payer: Self-pay | Admitting: Hematology

## 2022-07-15 MED ORDER — CAPECITABINE 500 MG PO TABS
ORAL_TABLET | ORAL | 0 refills | Status: DC
  Filled 2022-07-15: qty 70, 14d supply, fill #0

## 2022-07-17 ENCOUNTER — Other Ambulatory Visit: Payer: Self-pay

## 2022-07-18 ENCOUNTER — Other Ambulatory Visit (HOSPITAL_COMMUNITY): Payer: Self-pay

## 2022-07-19 ENCOUNTER — Other Ambulatory Visit (HOSPITAL_COMMUNITY): Payer: Self-pay

## 2022-07-19 NOTE — Congregational Nurse Program (Signed)
Home visit with interpreter [name].  Delivered Xeloda which was picked up from [facility].  She will restart tomorrow taking 2 in the morning and 3 tablets pm.  States she is still having trouble walking.  She is taking Xarelto as ordered but states no real improvement at this point. No redness or swelling in legs.  Patient requested cane but CN took her a walker for which she was very [employer] attorney.  Jake Michaelis RN, Congregational Nurse [phone]

## 2022-07-20 ENCOUNTER — Telehealth: Payer: Self-pay

## 2022-07-20 NOTE — Telephone Encounter (Signed)
Phone call to patient to relay information received through staff message from Cira Rue NP.  CN had contacted NP to clarify directions for taking Xeloda since new bottle stated she should take 14 days on and 7 days off.  During last round patient had been instructed to take  7 days on and 7 days off. NP advised to keep last regimen but that since she is still having difficulty ambulating that she should delay starting for 1 week.  Patient expressed understanding and agreement and plans to restart Xeloda in 1 week (07/27/2022).  Jake Michaelis RN, Congregational Nurse [phone]

## 2022-08-01 ENCOUNTER — Other Ambulatory Visit (HOSPITAL_COMMUNITY): Payer: Self-pay

## 2022-08-01 ENCOUNTER — Other Ambulatory Visit: Payer: Self-pay | Admitting: Internal Medicine

## 2022-08-01 DIAGNOSIS — I10 Essential (primary) hypertension: Secondary | ICD-10-CM

## 2022-08-02 MED FILL — Dexamethasone Sodium Phosphate Inj 100 MG/10ML: INTRAMUSCULAR | Qty: 1 | Status: AC

## 2022-08-03 ENCOUNTER — Encounter: Payer: Self-pay | Admitting: Hematology

## 2022-08-03 ENCOUNTER — Inpatient Hospital Stay: Payer: Medicaid Other

## 2022-08-03 ENCOUNTER — Other Ambulatory Visit (HOSPITAL_COMMUNITY): Payer: Self-pay

## 2022-08-03 ENCOUNTER — Inpatient Hospital Stay (HOSPITAL_BASED_OUTPATIENT_CLINIC_OR_DEPARTMENT_OTHER): Payer: Medicaid Other | Admitting: Hematology

## 2022-08-03 ENCOUNTER — Inpatient Hospital Stay: Payer: Medicaid Other | Attending: Physician Assistant

## 2022-08-03 ENCOUNTER — Other Ambulatory Visit: Payer: Self-pay

## 2022-08-03 VITALS — BP 154/90 | HR 81 | Temp 98.1°F | Resp 17 | Ht 59.0 in | Wt 124.9 lb

## 2022-08-03 DIAGNOSIS — M79605 Pain in left leg: Secondary | ICD-10-CM | POA: Diagnosis not present

## 2022-08-03 DIAGNOSIS — I10 Essential (primary) hypertension: Secondary | ICD-10-CM | POA: Diagnosis not present

## 2022-08-03 DIAGNOSIS — I82402 Acute embolism and thrombosis of unspecified deep veins of left lower extremity: Secondary | ICD-10-CM | POA: Insufficient documentation

## 2022-08-03 DIAGNOSIS — Z7901 Long term (current) use of anticoagulants: Secondary | ICD-10-CM | POA: Diagnosis not present

## 2022-08-03 DIAGNOSIS — Z86718 Personal history of other venous thrombosis and embolism: Secondary | ICD-10-CM | POA: Insufficient documentation

## 2022-08-03 DIAGNOSIS — C19 Malignant neoplasm of rectosigmoid junction: Secondary | ICD-10-CM | POA: Diagnosis not present

## 2022-08-03 DIAGNOSIS — C20 Malignant neoplasm of rectum: Secondary | ICD-10-CM | POA: Diagnosis not present

## 2022-08-03 DIAGNOSIS — I82432 Acute embolism and thrombosis of left popliteal vein: Secondary | ICD-10-CM

## 2022-08-03 DIAGNOSIS — M79604 Pain in right leg: Secondary | ICD-10-CM | POA: Diagnosis not present

## 2022-08-03 LAB — CEA (ACCESS): CEA (CHCC): 9.94 ng/mL — ABNORMAL HIGH (ref 0.00–5.00)

## 2022-08-03 LAB — CMP (CANCER CENTER ONLY)
ALT: 6 U/L (ref 0–44)
AST: 24 U/L (ref 15–41)
Albumin: 3.3 g/dL — ABNORMAL LOW (ref 3.5–5.0)
Alkaline Phosphatase: 83 U/L (ref 38–126)
Anion gap: 6 (ref 5–15)
BUN: 7 mg/dL — ABNORMAL LOW (ref 8–23)
CO2: 30 mmol/L (ref 22–32)
Calcium: 9.2 mg/dL (ref 8.9–10.3)
Chloride: 105 mmol/L (ref 98–111)
Creatinine: 0.62 mg/dL (ref 0.44–1.00)
GFR, Estimated: >60 mL/min (ref >60)
Glucose, Bld: 137 mg/dL — ABNORMAL HIGH (ref 70–99)
Potassium: 3.6 mmol/L (ref 3.5–5.1)
Sodium: 141 mmol/L (ref 135–145)
Total Bilirubin: 0.4 mg/dL (ref 0.3–1.2)
Total Protein: 6.9 g/dL (ref 6.5–8.1)

## 2022-08-03 LAB — CBC WITH DIFFERENTIAL (CANCER CENTER ONLY)
Abs Immature Granulocytes: 0.01 10*3/uL (ref 0.00–0.07)
Basophils Absolute: 0 10*3/uL (ref 0.0–0.1)
Basophils Relative: 1 %
Eosinophils Absolute: 0.1 10*3/uL (ref 0.0–0.5)
Eosinophils Relative: 2 %
HCT: 35.4 % — ABNORMAL LOW (ref 36.0–46.0)
Hemoglobin: 11.7 g/dL — ABNORMAL LOW (ref 12.0–15.0)
Immature Granulocytes: 0 %
Lymphocytes Relative: 21 %
Lymphs Abs: 0.6 10*3/uL — ABNORMAL LOW (ref 0.7–4.0)
MCH: 27.5 pg (ref 26.0–34.0)
MCHC: 33.1 g/dL (ref 30.0–36.0)
MCV: 83.3 fL (ref 80.0–100.0)
Monocytes Absolute: 0.4 10*3/uL (ref 0.1–1.0)
Monocytes Relative: 14 %
Neutro Abs: 1.9 10*3/uL (ref 1.7–7.7)
Neutrophils Relative %: 62 %
Platelet Count: 182 10*3/uL (ref 150–400)
RBC: 4.25 MIL/uL (ref 3.87–5.11)
RDW: 19.3 % — ABNORMAL HIGH (ref 11.5–15.5)
WBC Count: 3.1 10*3/uL — ABNORMAL LOW (ref 4.0–10.5)
nRBC: 0 % (ref 0.0–0.2)

## 2022-08-03 LAB — TOTAL PROTEIN, URINE DIPSTICK: Protein, ur: NEGATIVE mg/dL

## 2022-08-03 MED ORDER — TRAMADOL HCL 50 MG PO TABS
50.0000 mg | ORAL_TABLET | Freq: Three times a day (TID) | ORAL | 0 refills | Status: DC | PRN
  Filled 2022-08-03: qty 15, 5d supply, fill #0
  Filled 2022-08-14: qty 15, 5d supply, fill #1

## 2022-08-03 MED ORDER — RIVAROXABAN 20 MG PO TABS
20.0000 mg | ORAL_TABLET | Freq: Every day | ORAL | 2 refills | Status: DC
  Filled 2022-08-03: qty 30, 30d supply, fill #0
  Filled 2022-08-24 – 2022-08-26 (×2): qty 30, 30d supply, fill #1
  Filled 2022-09-28: qty 30, 30d supply, fill #0

## 2022-08-03 MED ORDER — LISINOPRIL-HYDROCHLOROTHIAZIDE 20-12.5 MG PO TABS
2.0000 | ORAL_TABLET | Freq: Every day | ORAL | 3 refills | Status: DC
  Filled 2022-08-03: qty 60, 30d supply, fill #0
  Filled 2022-08-31: qty 60, 30d supply, fill #1
  Filled 2022-09-28: qty 60, 30d supply, fill #2
  Filled 2022-11-02 – 2022-11-06 (×3): qty 60, 30d supply, fill #3

## 2022-08-03 NOTE — Progress Notes (Signed)
[place]   [phone] [phone]   Clinic Follow up Note   Patient Care Team: Gaylan Gerold, DO as PCP - General (Internal Medicine) Jonnie Finner, RN (Inactive) as Oncology Nurse Navigator Alla Feeling, NP as Nurse Practitioner (Nurse Practitioner) [name], MD as Consulting Physician (Hematology and Oncology) Kyung Rudd, MD as Consulting Physician (Radiation Oncology) [name], Telford Nab., MD as Consulting Physician (Gastroenterology) Garner Nash, DO as Consulting Physician (Pulmonary Disease) Michael Boston, MD as Consulting Physician (General Surgery)  Date of Service:  08/03/2022  CHIEF COMPLAINT: f/u of colon and rectal cancer    CURRENT THERAPY:   CAPEOX, q21d, starting 05/11/22             -Xeloda dose: '1000mg'$  AM, '1500mg'$  PM, days 1-14             -Xeloda changed to one week on/one week off starting 06/22/22 due to hand foot syndrome   ASSESSMENT:  Jillian Lutz is a 72 y.o. 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 -Despite strong recommendation, patient firmly and repeatedly declined IV chemo and surgery. She agreed to chemoRT with Xeloda, and received the treatment 01/04/21 - 02/14/21. -she previously declined surveillance measures, however, she developed pencil-thin and decreased caliber of stool. Her CEA normalized after chemoRT but increased to 18.49 on 09/28/21. She agreed to work up, and CT AP on 10/07/21 showed enlargement of LLL nodule.  -Further work up with PET 11/24/21, showed: hypermetabolic LLL nodule; marked hypermetabolism corresponding to residual rectal wall thickening; no other evidence of hypermetabolic metastasis.  -bronchoscopy 12/13/21 by Dr. Valeta Harms, cytology negative for malignancy. Repeat biopsy was  recommended, but she declined. -local recurrence confirmed by surveillance colonoscopy 01/2022. -she agreed to restart Xeloda and started on 02/16/22.  -restaging CT CAP on 04/17/22 showed: persistent rectal thickening extending 9.7 cm proximal to anorectal junction; no definite lymphadenopathy; slight increased size of superior LLL pulmonary nodule, currently 1.5 cm; no new pulmonary nodules or other metastatic disease.   -low dose oxaliplatin was added on 05/11/22 (with cycle 4 of Xeloda). She is tolerating well overall. -Restaging CT scan from July 14, 2022 showed improved lung metastasis, no other new lesions. -She has developed significant bilateral leg pain, on the right, to the point she had difficulty walking.  Due to the pain, she would like to hold oxaliplatin treatment today.  She is willing to restart Xeloda today again.  Left leg DVT ([facility]) -Doppler on July 13, 2022 showed left acute DVT involving popliteal and peroneal vein, CTA chest was negative for PE. -She started Xarelto, tolerating well.  Bilateral lower extremity pain, R>L -It started about 4 to 6 weeks ago, pain in bilateral buttocks and lower extremity, more on the right -Her pain cannot be explained completely by her left lower extremity DVT -Will obtain a lumbar MRI with and without contrast for further evaluation   PLAN: -Discussed Ct Scan, partial response  -hold Treatment due to pain in her legs -She will restart Xeloda and will continue for 2 weeks, then off for 1 week - Order lumbar MRI ,Pt consent to MRI -f/u in 2-3 wks  SUMMARY OF ONCOLOGIC HISTORY: Oncology History Overview Note  Cancer Staging Rectal adenocarcinoma [facility]) Staging form: Colon and Rectum, AJCC 8th Edition - Clinical stage from 12/21/2020: Stage IIIB (cT3, cN1,  cM0) - Unsigned    Rectal adenocarcinoma ([place])  08/26/2020 Miscellaneous   Initial presentation to PCP, reporting intermittent BRBPR since 02/2020    12/01/2020 Procedure    Colonoscopy by Dr. Silverio Decamp findings - The perianal and digital rectal examinations were normal. - A 15 mm polyp was found in the ascending colon. The polyp was semi-pedunculated. Resection and retrieval were complete. - A 25 mm polyp was found in the sigmoid colon. The polyp was pedunculated. Resection and retrieval were complete. - An infiltrative partially obstructing large mass was found in the proximal rectum. The mass was partially circumferential (involving one-half of the lumen circumference). The mass measured eight cm in length extending from 10 to 18cm from anal verge. This was biopsied with a cold forceps for histology. Proximal and distal opposite fold area of the mass lesion was tattooed with an injection of total 3 mL of Spot (carbon black).   12/01/2020 Initial Biopsy   Diagnosis 1. Colon, polyp(s), ascending x 1 - ADENOCARCINOMA ARISING IN A TUBULAR ADENOMA WITH HIGH-GRADE DYSPLASIA. SEE NOTE 2. Colon, sigmoid polyp, x1 - TUBULOVILLOUS ADENOMA(S) - NEGATIVE FOR HIGH-GRADE DYSPLASIA OR MALIGNANCY 3. Rectum, biopsy - ADENOCARCINOMA. SEE NOTE   12/01/2020 Cancer Staging   Cancer Staging Rectal adenocarcinoma [facility]) Staging form: Colon and Rectum, AJCC 8th Edition - Clinical stage from 12/21/2020: Stage IIIB (cT3, cN1, cM0) - Unsigned    12/06/2020 Imaging   CT CAP with contrast IMPRESSION: 1. There is partially circumferential soft tissue thickening of the mid to superior rectum, approximately 5 cm in length and the inferior extent approximately 6 cm above the anal verge, consistent with primary rectal malignancy identified by colonoscopy. 2. There appear to be abnormally enlarged perirectal lymph nodes posteriorly about the superior rectum measuring up to 1.0 x 0.8 cm. Findings are suspicious for perirectal nodal metastatic disease, however rectal MRI is the test of choice for initial local staging of rectal cancer. 3. There is a 4 mm nonspecific pulmonary nodule of the  superior segment left lower lobe, statistically most likely incidental, infectious or inflammatory, although nonspecific and isolated metastatic disease is not strictly excluded. Attention on follow-up. 4. No other evidence of metastatic disease in the chest, abdomen, or pelvis. Aortic Atherosclerosis (ICD10-I70.0).   12/09/2020 Imaging   Local staging MRI pelvis without contrast IMPRESSION: 4.9 cm circumferential mid/lower rectal mass, corresponding to the patient's newly diagnosed rectal cancer. Rectal adenocarcinoma T stage: T3c Rectal adenocarcinoma N stage:  N1 Distance from tumor to the internal anal sphincter is 3.7 cm.   12/21/2020 Initial Diagnosis   Rectal adenocarcinoma ([place])   01/04/2021 -  Chemotherapy   Concurrent chemoradiation with Xeloda '1000mg'$  in the AM and '1500mg'$  in the PM on days of Radiation starting 01/04/21.       01/04/2021 - 02/11/2021 Radiation Therapy   Concurrent chemoradiation with Dr Lisbeth Renshaw and Xeloda starting 01/04/21.    01/31/2022 Procedure   Colonoscopy, Dr. Silverio Decamp  Findings: - An infiltrative partially obstructing large mass was found in the rectum. The mass was circumferential. The mass measured ten cm in length, extending from 5-15cm from anal verge. In addition, rectal diameter at narrowest approx twelve mm. Oozing was present. Biopsies were taken with a cold forceps for histology.  Impression: - Hemorrhoids found on perianal exam. - Likely malignant partially obstructing tumor in the rectum. Biopsied. - Non-bleeding external and internal hemorrhoids.   01/31/2022 Pathology Results   Diagnosis Rectum, biopsy INVASIVE MODERATELY DIFFERENTIATED ADENOCARCINOMA   05/11/2022 -  Chemotherapy  Patient is on Treatment Plan : COLORECTAL Xelox (Capeox)(130/850) q21d        INTERVAL HISTORY:  Jillian Lutz is here for a follow up of colon and rectal cancer   She was last seen by NP [name] on 07/13/2022 She presents to the clinic accompanied by  Interpreter. Pt reports of having stabbing shooting pain on her right side of her leg. Pt  states her Bm is better cause she takes Miralax everyday and denies having blood in stool.Pt states she could not get out of the bed.The pain started about 4 wks ago.    All other systems were reviewed with the patient and are negative.  MEDICAL HISTORY:  Past Medical History:  Diagnosis Date   Bilateral wrist pain 10/25/2017   Hypertension    Neck mass 1998   Unknown biopsy results. Excised in [place].   Pre-diabetes    Rectal cancer ([place])    Trigger finger, acquired 02/08/2017    SURGICAL HISTORY: Past Surgical History:  Procedure Laterality Date   BRONCHIAL BIOPSY  12/13/2021   Procedure: BRONCHIAL BIOPSIES;  Surgeon: Garner Nash, DO;  Location: [place] [facility];  Service: Pulmonary;;   BRONCHIAL NEEDLE ASPIRATION BIOPSY  12/13/2021   Procedure: BRONCHIAL NEEDLE ASPIRATION BIOPSIES;  Surgeon: Garner Nash, DO;  Location: [place] [facility];  Service: Pulmonary;;   NECK SURGERY Left 1998   Excision of mass in [place]   VIDEO BRONCHOSCOPY WITH RADIAL ENDOBRONCHIAL ULTRASOUND  12/13/2021   Procedure: RADIAL ENDOBRONCHIAL ULTRASOUND;  Surgeon: Garner Nash, DO;  Location: [place] [facility];  Service: Pulmonary;;    I have reviewed the social history and family history with the patient and they are unchanged from previous note.  ALLERGIES:  is allergic to no known allergies.  MEDICATIONS:  Current Outpatient Medications  Medication Sig Dispense Refill   rivaroxaban (XARELTO) 20 MG TABS tablet Take 1 tablet (20 mg total) by mouth daily with supper. 30 tablet 2   traMADol (ULTRAM) 50 MG tablet Take 1 tablet (50 mg total) by mouth every 8 (eight) hours as needed. 30 tablet 0   amLODipine (NORVASC) 5 MG tablet Take 1 tablet (5 mg total) by mouth daily. 90 tablet 3   capecitabine (XELODA) 500 MG tablet Take 2 tablets (1000 mg) by mouth in morning and 3 tablets (1500 mg) by mouth in evening. Take  within 30 minutes after meals. Take for 14 days on, 7 days off. Repeat every 21 days. 70 tablet 0   gabapentin (NEURONTIN) 100 MG capsule Take 1 capsule (100 mg total) by mouth 3 (three) times daily as needed. 90 capsule 2   lisinopril-hydrochlorothiazide (ZESTORETIC) 20-12.5 MG tablet Take 2 tablets by mouth daily. 60 tablet 3   Multiple Vitamins-Minerals (MULTIVITAMIN WITH MINERALS) tablet Take 1 tablet by mouth daily. Gummy 50 mg     polyethylene glycol powder (GLYCOLAX/MIRALAX) 17 GM/SCOOP powder Take 1 Container by mouth once.     prochlorperazine (COMPAZINE) 10 MG tablet Take 1 tablet (10 mg total) by mouth every 6 (six) hours as needed. 30 tablet 2   urea (CARMOL) 10 % cream Apply topically 2 times daily as needed. 85 g 3   No current facility-administered medications for this visit.    PHYSICAL EXAMINATION: ECOG PERFORMANCE STATUS: 2 - Symptomatic, <50% confined to bed  Vitals:   08/03/22 0917  BP: (!) 154/90  Pulse: 81  Resp: 17  Temp: 98.1 F (36.7 C)  SpO2: 100%   Wt Readings from Last 3 Encounters:  08/03/22 124  lb 14.4 oz (56.7 kg)  07/13/22 124 lb 14.4 oz (56.7 kg)  06/22/22 128 lb 4.8 oz (58.2 kg)     GENERAL:alert, no distress and comfortable SKIN: skin color normal, no rashes or significant lesions EYES: normal, Conjunctiva are pink and non-injected, sclera clear  NEURO: alert & oriented x 3 with fluent speech  LABORATORY DATA:  I have reviewed the data as listed    Latest Ref Rng & Units 08/03/2022    8:54 AM 07/13/2022    9:34 AM 06/22/2022    9:06 AM  CBC  WBC 4.0 - 10.5 K/uL 3.1  3.0  4.0   Hemoglobin 12.0 - 15.0 g/dL 11.7  11.1  11.7   Hematocrit 36.0 - 46.0 % 35.4  32.4  34.9   Platelets 150 - 400 K/uL 182  151  131         Latest Ref Rng & Units 08/03/2022    8:54 AM 07/13/2022    9:34 AM 06/22/2022    9:06 AM  CMP  Glucose 70 - 99 mg/dL 137  147  175   BUN 8 - 23 mg/dL '7  11  13   '$ Creatinine 0.44 - 1.00 mg/dL 0.62  0.61  0.65   Sodium 135  - 145 mmol/L 141  138  140   Potassium 3.5 - 5.1 mmol/L 3.6  3.7  3.3   Chloride 98 - 111 mmol/L 105  105  105   CO2 22 - 32 mmol/L '30  29  26   '$ Calcium 8.9 - 10.3 mg/dL 9.2  8.8  9.1   Total Protein 6.5 - 8.1 g/dL 6.9  6.6  6.6   Total Bilirubin 0.3 - 1.2 mg/dL 0.4  0.4  0.4   Alkaline Phos 38 - 126 U/L 83  60  53   AST 15 - 41 U/L '24  20  21   '$ ALT 0 - 44 U/L '6  5  7       '$ RADIOGRAPHIC STUDIES: I have personally reviewed the radiological images as listed and agreed with the findings in the report. No results found.    Orders Placed This Encounter  Procedures   MR Lumbar Spine Wo Contrast    Standing Status:   Future    Standing Expiration Date:   08/03/2023    Order Specific Question:   What is the patient's sedation requirement?    Answer:   No Sedation    Order Specific Question:   Does the patient have a pacemaker or implanted devices?    Answer:   No    Order Specific Question:   Preferred imaging location?    Answer:   [facility] (table limit - 550 lbs)   All questions were answered. The patient knows to call the clinic with any problems, questions or concerns. No barriers to learning was detected. The total time spent in the appointment was 40 minutes.     [name], MD 08/04/2022   Felicity Coyer, CMA, am acting as scribe for [name], MD.   I have reviewed the above documentation for accuracy and completeness, and I agree with the above.

## 2022-08-03 NOTE — Assessment & Plan Note (Signed)
-  Doppler on July 13, 2022 showed left acute DVT involving popliteal and peroneal vein, CTA chest was negative for PE. -She started Xarelto, tolerating well.

## 2022-08-03 NOTE — Telephone Encounter (Signed)
Next appt scheduled 09/06/22 with PCP. 

## 2022-08-03 NOTE — Assessment & Plan Note (Addendum)
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 -Despite strong recommendation, patient firmly and repeatedly declined IV chemo and surgery. She agreed to chemoRT with Xeloda, and received the treatment 01/04/21 - 02/14/21. -she previously declined surveillance measures, however, she developed pencil-thin and decreased caliber of stool. Her CEA normalized after chemoRT but increased to 18.49 on 09/28/21. She agreed to work up, and CT AP on 10/07/21 showed enlargement of LLL nodule.  -Further work up with PET 11/24/21, showed: hypermetabolic LLL nodule; marked hypermetabolism corresponding to residual rectal wall thickening; no other evidence of hypermetabolic metastasis.  -bronchoscopy 12/13/21 by Dr. Valeta Harms, cytology negative for malignancy. Repeat biopsy was recommended, but she declined. -local recurrence confirmed by surveillance colonoscopy 01/2022. -she agreed to restart Xeloda and started on 02/16/22.  -restaging CT CAP on 04/17/22 showed: persistent rectal thickening extending 9.7 cm proximal to anorectal junction; no definite lymphadenopathy; slight increased size of superior LLL pulmonary nodule, currently 1.5 cm; no new pulmonary nodules or other metastatic disease.   -low dose oxaliplatin was added on 05/11/22 (with cycle 4 of Xeloda). She is tolerating well overall. -Restaging CT scan from July 14, 2022 showed improved lung metastasis, no other new lesions.

## 2022-08-03 NOTE — Congregational Nurse Program (Signed)
Accompanied patient to appointment with Dr. Burr Medico at [facility].  Picked up Tramadol and Xarelto from [employer], Lisinopril-hctz, Amlodipine, and Gabapentin from [facility] out-patient pharmacy and delivered them to patient at home.  Reviewed instructions for Tramadol which was also explained by interpreter at appointment with Dr. Burr Medico.  Advised CN will call her to inform when MRI is scheduled.  Jake Michaelis RN, Congregational Nurse [phone]

## 2022-08-03 NOTE — Telephone Encounter (Signed)
Refill request on lisinopril-hydrochlorothiazide (ZESTORETIC) 20-12.5 MG tablet.   Would like this medication to be filled by today.

## 2022-08-04 ENCOUNTER — Encounter: Payer: Self-pay | Admitting: Hematology

## 2022-08-09 NOTE — Congregational Nurse Program (Signed)
Home visit with interpreter Paulina Fusi and Resident Dr. Lyndle Herrlich.  Patient states that on Sunday evening 08/06/2022 she experienced vomited for about 1 hour around 7:00 pm.  Since then she has been nauseous and only able to drink small amounts of water, Ensure and occasional soup.  She stopped all medications at that point including Xeloda.  At approximately 2:00 pm she took Compazine and feels some improvement.  BP check revealed elevated pressure at 142/92 so she did take her BP meds around 3:15 pm.  CN sent message to Dr. Burr Medico who replied that it is ok to stop Xeloda until she feels better.  Patient almost out of Miralax and milk of Mag and requested CN pick this up for her along with Vitamin B 12.  Will follow-up with home visit tomorrow.  Jake Michaelis RN, Congregational Nurse [phone]

## 2022-08-10 NOTE — Congregational Nurse Program (Signed)
Home visit with interpreter [name] assisting by phone.  Informed patient that her MRI of lumbar spine is scheduled for Jan. 30. 2024 @ 3:00 pm [facility].  She needs to arrive at 2:30 and CN will schedule transportation thru Medicaid.  Picked up Miralax, Milk of Magnesia (recommended by Dr. Burr Medico) and vitamin B-12 per patient's request from pharmacy and took them to her.  States she is feeling a little better today and has been taking Compazine every 6 to 8 hours enabling her to eat small amounts.  Also taking Tramadol for leg pain every 8 to 12 hours. States it helps.  She also started back on BP meds due to elevated readings up to 484 systolic yesterday.  This morning states it was 122/? 70.  Does not feel ready to restart Xeloda yet but will let CN know when she restarts.  Jake Michaelis RN, Congregational Nurse [phone]

## 2022-08-11 ENCOUNTER — Other Ambulatory Visit (HOSPITAL_COMMUNITY): Payer: Self-pay

## 2022-08-14 ENCOUNTER — Telehealth: Payer: Self-pay

## 2022-08-14 ENCOUNTER — Other Ambulatory Visit: Payer: Self-pay

## 2022-08-14 ENCOUNTER — Other Ambulatory Visit (HOSPITAL_COMMUNITY): Payer: Self-pay

## 2022-08-14 NOTE — Telephone Encounter (Signed)
Phone call to patient to remind her of appointment tomorrow at 2:30 pm for MRI at [facility].  Also told her CN will meet her there and bring Compazine and tramadol refills.  States she is feeling better today and is able to eat.  Has not resumed Xeloda yet. Jake Michaelis RN, Congregational Nurse [phone]

## 2022-08-15 ENCOUNTER — Ambulatory Visit (HOSPITAL_COMMUNITY)
Admission: RE | Admit: 2022-08-15 | Discharge: 2022-08-15 | Disposition: A | Discharge status: Home / Self Care | Payer: Medicaid Other | Source: Ambulatory Visit | Attending: Hematology | Admitting: Hematology

## 2022-08-15 DIAGNOSIS — C20 Malignant neoplasm of rectum: Secondary | ICD-10-CM | POA: Diagnosis not present

## 2022-08-15 NOTE — Congregational Nurse Program (Signed)
CN picked up Compazine and Tramadol refills from [facility] and gave them to patient while she was awaiting MRI at [facility].  Interpreter present and helped with explaining medication purpose and dosage.  Patient stated she understood as she has previously been taking these medications.  Jake Michaelis RN, Congregational Nurse [phone]

## 2022-08-23 ENCOUNTER — Other Ambulatory Visit (HOSPITAL_COMMUNITY): Payer: Self-pay

## 2022-08-23 DIAGNOSIS — H25812 Combined forms of age-related cataract, left eye: Secondary | ICD-10-CM | POA: Diagnosis not present

## 2022-08-23 DIAGNOSIS — H2521 Age-related cataract, morgagnian type, right eye: Secondary | ICD-10-CM | POA: Diagnosis not present

## 2022-08-23 DIAGNOSIS — H40031 Anatomical narrow angle, right eye: Secondary | ICD-10-CM | POA: Diagnosis not present

## 2022-08-23 MED ORDER — DORZOLAMIDE HCL-TIMOLOL MAL 2-0.5 % OP SOLN
1.0000 [drp] | Freq: Two times a day (BID) | OPHTHALMIC | 3 refills | Status: DC
  Filled 2022-08-23: qty 30, 90d supply, fill #0
  Filled 2022-08-23: qty 30, 60d supply, fill #0
  Filled 2022-08-24: qty 10, 50d supply, fill #0

## 2022-08-23 MED ORDER — LATANOPROST 0.005 % OP SOLN
1.0000 [drp] | Freq: Every evening | OPHTHALMIC | 3 refills | Status: DC
  Filled 2022-08-23: qty 7.5, 30d supply, fill #0

## 2022-08-23 MED ORDER — BRIMONIDINE TARTRATE 0.2 % OP SOLN
1.0000 [drp] | Freq: Two times a day (BID) | OPHTHALMIC | 3 refills | Status: DC
  Filled 2022-08-23: qty 10, 90d supply, fill #0
  Filled 2022-08-23: qty 15, 90d supply, fill #0

## 2022-08-23 NOTE — Congregational Nurse Program (Signed)
CN and interpreter [name] accompanied patient to eye doctor appointment with Dr. Wyatt Portela at [facility].  Patient's intraocular pressures were extremely elevated.  The following medications were prescribed to lower pressure 1) latanoprost 1 gtt daily at bedtime in both eyes 2) brimonidine 1 gtt B.I.D both eyes and 3) dorzolamide-timolol 1 gtt B.I.d. both eyes.  Picked up latanoprost from [facility] [employer]. [place] other 2 were not available but they sent prescriptions to [facility] for pick up tomorrow. She has a follow-up appointment to recheck pressures on February 12 at 10:00 am.  Patient also has cataracts in both eyes, much worse on right.  Scheduled rt.  cataract surgery for February 16,2024 (8:15) at [facility] [place].Follow-up appointment same day at 1:00 pm with Dr. Katy Fitch and again on September 11, 2022 at 12:45 pm.  Jake Michaelis RN, Congregational Nurse [phone]

## 2022-08-23 NOTE — Progress Notes (Unsigned)
[place]   [phone] [phone]   Clinic Follow up Note   Patient Care Team: Gaylan Gerold, DO as PCP - General (Internal Medicine) Jonnie Finner, RN (Inactive) as Oncology Nurse Navigator Alla Feeling, NP as Nurse Practitioner (Nurse Practitioner) [name], MD as Consulting Physician (Hematology and Oncology) Kyung Rudd, MD as Consulting Physician (Radiation Oncology) [name], Telford Nab., MD as Consulting Physician (Gastroenterology) Garner Nash, DO as Consulting Physician (Pulmonary Disease) Michael Boston, MD as Consulting Physician (General Surgery)  Date of Service:  08/24/2022  CHIEF COMPLAINT: f/u of colon and rectal cancer    CURRENT THERAPY:   CAPEOX, q21d, starting 05/11/22             -Xeloda dose: '1000mg'$  AM, '1500mg'$  PM, days 1-14             -Xeloda changed to one week on/one week off starting 06/22/22 due to hand foot syndrome   ASSESSMENT:  Jillian Lutz is a 72 y.o. 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 -Despite strong recommendation, patient firmly and repeatedly declined IV chemo and surgery. She agreed to chemoRT with Xeloda, and received the treatment 01/04/21 - 02/14/21. -she previously declined surveillance measures, however, she developed pencil-thin and decreased caliber of stool. Her CEA normalized after chemoRT but increased to 18.49 on 09/28/21. She agreed to work up, and CT AP on 10/07/21 showed enlargement of LLL nodule.  -Further work up with PET 11/24/21, showed: hypermetabolic LLL nodule; marked hypermetabolism corresponding to residual rectal wall thickening; no other evidence of hypermetabolic metastasis.  -bronchoscopy 12/13/21 by Dr. Valeta Harms, cytology negative for malignancy. Repeat biopsy was  recommended, but she declined. -local recurrence confirmed by surveillance colonoscopy 01/2022. -she agreed to restart Xeloda and started on 02/16/22.  -restaging CT CAP on 04/17/22 showed: persistent rectal thickening extending 9.7 cm proximal to anorectal junction; no definite lymphadenopathy; slight increased size of superior LLL pulmonary nodule, currently 1.5 cm; no new pulmonary nodules or other metastatic disease.   -low dose oxaliplatin was added on 05/11/22 (with cycle 4 of Xeloda). She is tolerating well overall. -Restaging CT scan from July 14, 2022 showed improved lung metastasis, no other new lesions. -She has developed significant bilateral leg pain, more on the right, to the point she had difficulty walking, oxaliplatin was held last cycle.  She tried taking Xeloda for a few days, and stopped it due to recurrent nausea and vomiting. -She has persistent nausea, low appetite, insomnia, but her leg pain has improved.  Due to her present symptoms, will continue chemo break and repeat a scan in 2 to 3 weeks.  Left leg DVT (HCC) -Doppler on July 13, 2022 showed left acute DVT involving popliteal and peroneal vein, CTA chest was negative for PE. -She started Xarelto, tolerating well.  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  -pain management, I refilled tramadol.  Her pain is overall much  improved. -we discussed orth referral, will hold it for now.  Nausea and low appetite, weight loss  -This has persisted despite  holding chemotherapy -We again reviewed antiemetics, and I encouraged her to use -I also called in mirtazapine 7.5 mg hs for her to use, to improve her appetite and sleep.   PLAN: -Discuss MRI no bone metastasis, but has fracture. - I refill Tramadol -recommend increase Boost /Ensure intake -I prescribed Mirtazapine for appetite -order CT scan to be done in 2-3 weeks  -Hold treatment due to nausea and no appetite -f/u in 3 weeks   SUMMARY OF ONCOLOGIC HISTORY: Oncology History Overview Note  Cancer Staging Rectal adenocarcinoma [facility]) Staging form: Colon and Rectum, AJCC 8th Edition - Clinical stage from 12/21/2020: Stage IIIB (cT3, cN1, cM0) - Unsigned    Rectal adenocarcinoma ([place])  08/26/2020 Miscellaneous   Initial presentation to PCP, reporting intermittent BRBPR since 02/2020    12/01/2020 Procedure   Colonoscopy by Dr. Silverio Decamp findings - The perianal and digital rectal examinations were normal. - A 15 mm polyp was found in the ascending colon. The polyp was semi-pedunculated. Resection and retrieval were complete. - A 25 mm polyp was found in the sigmoid colon. The polyp was pedunculated. Resection and retrieval were complete. - An infiltrative partially obstructing large mass was found in the proximal rectum. The mass was partially circumferential (involving one-half of the lumen circumference). The mass measured eight cm in length extending from 10 to 18cm from anal verge. This was biopsied with a cold forceps for histology. Proximal and distal opposite fold area of the mass lesion was tattooed with an injection of total 3 mL of Spot (carbon black).   12/01/2020 Initial Biopsy   Diagnosis 1. Colon, polyp(s), ascending x 1 - ADENOCARCINOMA ARISING IN A TUBULAR ADENOMA WITH HIGH-GRADE DYSPLASIA. SEE NOTE 2. Colon, sigmoid polyp, x1 - TUBULOVILLOUS ADENOMA(S) - NEGATIVE FOR HIGH-GRADE DYSPLASIA OR MALIGNANCY 3. Rectum, biopsy - ADENOCARCINOMA. SEE NOTE   12/01/2020 Cancer  Staging   Cancer Staging Rectal adenocarcinoma [facility]) Staging form: Colon and Rectum, AJCC 8th Edition - Clinical stage from 12/21/2020: Stage IIIB (cT3, cN1, cM0) - Unsigned    12/06/2020 Imaging   CT CAP with contrast IMPRESSION: 1. There is partially circumferential soft tissue thickening of the mid to superior rectum, approximately 5 cm in length and the inferior extent approximately 6 cm above the anal verge, consistent with primary rectal malignancy identified by colonoscopy. 2. There appear to be abnormally enlarged perirectal lymph nodes posteriorly about the superior rectum measuring up to 1.0 x 0.8 cm. Findings are suspicious for perirectal nodal metastatic disease, however rectal MRI is the test of choice for initial local staging of rectal cancer. 3. There is a 4 mm nonspecific pulmonary nodule of the superior segment left lower lobe, statistically most likely incidental, infectious or inflammatory, although nonspecific and isolated metastatic disease is not strictly excluded. Attention on follow-up. 4. No other evidence of metastatic disease in the chest, abdomen, or pelvis. Aortic Atherosclerosis (ICD10-I70.0).   12/09/2020 Imaging   Local staging MRI pelvis without contrast IMPRESSION: 4.9 cm circumferential mid/lower rectal mass, corresponding to the patient's newly diagnosed rectal cancer. Rectal adenocarcinoma T stage: T3c Rectal adenocarcinoma N stage:  N1 Distance from tumor to the internal anal sphincter is 3.7 cm.   12/21/2020 Initial Diagnosis   Rectal adenocarcinoma ([place])   01/04/2021 -  Chemotherapy   Concurrent chemoradiation with Xeloda '1000mg'$  in the AM and '1500mg'$  in the PM on days of Radiation  starting 01/04/21.       01/04/2021 - 02/11/2021 Radiation Therapy   Concurrent chemoradiation with Dr Lisbeth Renshaw and Xeloda starting 01/04/21.    01/31/2022 Procedure   Colonoscopy, Dr. Silverio Decamp  Findings: - An infiltrative partially obstructing large mass was found  in the rectum. The mass was circumferential. The mass measured ten cm in length, extending from 5-15cm from anal verge. In addition, rectal diameter at narrowest approx twelve mm. Oozing was present. Biopsies were taken with a cold forceps for histology.  Impression: - Hemorrhoids found on perianal exam. - Likely malignant partially obstructing tumor in the rectum. Biopsied. - Non-bleeding external and internal hemorrhoids.   01/31/2022 Pathology Results   Diagnosis Rectum, biopsy INVASIVE MODERATELY DIFFERENTIATED ADENOCARCINOMA   05/11/2022 -  Chemotherapy   Patient is on Treatment Plan : COLORECTAL Xelox (Capeox)(130/850) q21d        INTERVAL HISTORY:  Jillian Lutz is here for a follow up of  colon and rectal cancer  She was last seen by me on 08/03/2022 She presents to the clinic accompanied by interpreter. Pt states the pain in her leg is a little better. I pain rate is 3 . The tramadol she was prescribe has helped. Pt states right side no pain a, but left side a little pain.Pt state she has not be able to eat due to no appetite, but she drinks  Boost at least two bottles a day. Pt denies having Bm issues.    All other systems were reviewed with the patient and are negative.  MEDICAL HISTORY:  Past Medical History:  Diagnosis Date   Bilateral wrist pain 10/25/2017   Hypertension    Neck mass 1998   Unknown biopsy results. Excised in [place].   Pre-diabetes    Rectal cancer ([place])    Trigger finger, acquired 02/08/2017    SURGICAL HISTORY: Past Surgical History:  Procedure Laterality Date   BRONCHIAL BIOPSY  12/13/2021   Procedure: BRONCHIAL BIOPSIES;  Surgeon: Garner Nash, DO;  Location: [place] [facility];  Service: Pulmonary;;   BRONCHIAL NEEDLE ASPIRATION BIOPSY  12/13/2021   Procedure: BRONCHIAL NEEDLE ASPIRATION BIOPSIES;  Surgeon: Garner Nash, DO;  Location: [place] [facility];  Service: Pulmonary;;   NECK SURGERY Left 1998   Excision of mass in [place]   VIDEO  BRONCHOSCOPY WITH RADIAL ENDOBRONCHIAL ULTRASOUND  12/13/2021   Procedure: RADIAL ENDOBRONCHIAL ULTRASOUND;  Surgeon: Garner Nash, DO;  Location: [place] [facility];  Service: Pulmonary;;    I have reviewed the social history and family history with the patient and they are unchanged from previous note.  ALLERGIES:  is allergic to no known allergies.  MEDICATIONS:  Current Outpatient Medications  Medication Sig Dispense Refill   mirtazapine (REMERON) 7.5 MG tablet Take 1 tablet (7.5 mg total) by mouth at bedtime. 30 tablet 0   amLODipine (NORVASC) 5 MG tablet Take 1 tablet (5 mg total) by mouth daily. 90 tablet 3   brimonidine (ALPHAGAN) 0.2 % ophthalmic solution Place 1 drop into the right eye 2 (two) times daily. 15 mL 3   brimonidine (ALPHAGAN) 0.2 % ophthalmic solution Place 1 drop into the right eye 2 (two) times daily. 10 mL 11   capecitabine (XELODA) 500 MG tablet Take 2 tablets (1000 mg) by mouth in morning and 3 tablets (1500 mg) by mouth in evening. Take within 30 minutes after meals. Take for 14 days on, 7 days off. Repeat every 21 days. 70 tablet 0   dorzolamide-timolol (COSOPT) 2-0.5 % ophthalmic solution Place 1  drop into both eyes 2 times daily. 30 mL 3   dorzolamide-timolol (COSOPT) 2-0.5 % ophthalmic solution Instill 1 drop into both eyes twice a day 10 mL 11   gabapentin (NEURONTIN) 100 MG capsule Take 1 capsule (100 mg total) by mouth 3 (three) times daily as needed. 90 capsule 2   latanoprost (XALATAN) 0.005 % ophthalmic solution Instill 1 drop into both eyes every night at bedtime 7.5 mL 3   latanoprost (XALATAN) 0.005 % ophthalmic solution Place 1 drop into both eyes at bedtime. 7.5 mL 6   lisinopril-hydrochlorothiazide (ZESTORETIC) 20-12.5 MG tablet Take 2 tablets by mouth daily. 60 tablet 3   Multiple Vitamins-Minerals (MULTIVITAMIN WITH MINERALS) tablet Take 1 tablet by mouth daily. Gummy 50 mg     polyethylene glycol powder (GLYCOLAX/MIRALAX) 17 GM/SCOOP powder Take 1  Container by mouth once.     prochlorperazine (COMPAZINE) 10 MG tablet Take 1 tablet (10 mg total) by mouth every 6 (six) hours as needed. 30 tablet 2   rivaroxaban (XARELTO) 20 MG TABS tablet Take 1 tablet (20 mg total) by mouth daily with supper. 30 tablet 2   traMADol (ULTRAM) 50 MG tablet Take 1 tablet (50 mg total) by mouth every 8 (eight) hours as needed. 60 tablet 0   urea (CARMOL) 10 % cream Apply topically 2 times daily as needed. 85 g 3   No current facility-administered medications for this visit.    PHYSICAL EXAMINATION: ECOG PERFORMANCE STATUS: 2 - Symptomatic, <50% confined to bed  Vitals:   08/24/22 0944  BP: (!) 156/88  Pulse: 77  Temp: 98.3 F (36.8 C)  SpO2: 100%   Wt Readings from Last 3 Encounters:  08/24/22 117 lb 14.4 oz (53.5 kg)  08/03/22 124 lb 14.4 oz (56.7 kg)  07/13/22 124 lb 14.4 oz (56.7 kg)     GENERAL:alert, no distress and comfortable SKIN: skin color normal, no rashes or significant lesions EYES: normal, Conjunctiva are pink and non-injected, sclera clear  NEURO: alert & oriented x 3 with fluent speech   LABORATORY DATA:  I have reviewed the data as listed    Latest Ref Rng & Units 08/24/2022    9:28 AM 08/03/2022    8:54 AM 07/13/2022    9:34 AM  CBC  WBC 4.0 - 10.5 K/uL 4.6  3.1  3.0   Hemoglobin 12.0 - 15.0 g/dL 12.4  11.7  11.1   Hematocrit 36.0 - 46.0 % 37.3  35.4  32.4   Platelets 150 - 400 K/uL 228  182  151         Latest Ref Rng & Units 08/24/2022    9:28 AM 08/03/2022    8:54 AM 07/13/2022    9:34 AM  CMP  Glucose 70 - 99 mg/dL 133  137  147   BUN 8 - 23 mg/dL '8  7  11   '$ Creatinine 0.44 - 1.00 mg/dL 0.66  0.62  0.61   Sodium 135 - 145 mmol/L 139  141  138   Potassium 3.5 - 5.1 mmol/L 3.4  3.6  3.7   Chloride 98 - 111 mmol/L 106  105  105   CO2 22 - 32 mmol/L '27  30  29   '$ Calcium 8.9 - 10.3 mg/dL 9.1  9.2  8.8   Total Protein 6.5 - 8.1 g/dL 6.9  6.9  6.6   Total Bilirubin 0.3 - 1.2 mg/dL 0.5  0.4  0.4   Alkaline Phos  38 - 126 U/L 86  83  60   AST 15 - 41 U/L '22  24  20   '$ ALT 0 - 44 U/L '5  6  5       '$ RADIOGRAPHIC STUDIES: I have personally reviewed the radiological images as listed and agreed with the findings in the report. No results found.    Orders Placed This Encounter  Procedures   CT CHEST ABDOMEN PELVIS W CONTRAST    Standing Status:   Future    Standing Expiration Date:   08/25/2023    Order Specific Question:   Preferred imaging location?    Answer:   [facility]    Order Specific Question:   Is Oral Contrast requested for this exam?    Answer:   Yes, Per Radiology protocol   All questions were answered. The patient knows to call the clinic with any problems, questions or concerns. No barriers to learning was detected. The total time spent in the appointment was 30 minutes.     [name], MD 08/24/2022   Felicity Coyer, CMA, am acting as scribe for [name], MD.   I have reviewed the above documentation for accuracy and completeness, and I agree with the above.

## 2022-08-24 ENCOUNTER — Other Ambulatory Visit (HOSPITAL_COMMUNITY): Payer: Self-pay

## 2022-08-24 ENCOUNTER — Inpatient Hospital Stay: Payer: Medicaid Other

## 2022-08-24 ENCOUNTER — Inpatient Hospital Stay: Payer: Medicaid Other | Attending: Physician Assistant

## 2022-08-24 ENCOUNTER — Encounter: Payer: Self-pay | Admitting: Hematology

## 2022-08-24 ENCOUNTER — Other Ambulatory Visit: Payer: Self-pay

## 2022-08-24 ENCOUNTER — Inpatient Hospital Stay: Payer: Medicaid Other | Admitting: Nutrition

## 2022-08-24 ENCOUNTER — Inpatient Hospital Stay (HOSPITAL_BASED_OUTPATIENT_CLINIC_OR_DEPARTMENT_OTHER): Payer: Medicaid Other | Admitting: Hematology

## 2022-08-24 VITALS — BP 156/88 | HR 77 | Temp 98.3°F | Ht 59.0 in | Wt 117.9 lb

## 2022-08-24 DIAGNOSIS — C20 Malignant neoplasm of rectum: Secondary | ICD-10-CM | POA: Diagnosis not present

## 2022-08-24 DIAGNOSIS — C7800 Secondary malignant neoplasm of unspecified lung: Secondary | ICD-10-CM | POA: Diagnosis not present

## 2022-08-24 DIAGNOSIS — M79604 Pain in right leg: Secondary | ICD-10-CM | POA: Insufficient documentation

## 2022-08-24 DIAGNOSIS — Z7901 Long term (current) use of anticoagulants: Secondary | ICD-10-CM | POA: Diagnosis not present

## 2022-08-24 DIAGNOSIS — M79605 Pain in left leg: Secondary | ICD-10-CM | POA: Diagnosis not present

## 2022-08-24 DIAGNOSIS — I10 Essential (primary) hypertension: Secondary | ICD-10-CM | POA: Diagnosis not present

## 2022-08-24 DIAGNOSIS — M79606 Pain in leg, unspecified: Secondary | ICD-10-CM | POA: Insufficient documentation

## 2022-08-24 DIAGNOSIS — Z86718 Personal history of other venous thrombosis and embolism: Secondary | ICD-10-CM | POA: Insufficient documentation

## 2022-08-24 DIAGNOSIS — I82432 Acute embolism and thrombosis of left popliteal vein: Secondary | ICD-10-CM | POA: Diagnosis not present

## 2022-08-24 LAB — CMP (CANCER CENTER ONLY)
ALT: <5 U/L (ref 0–44)
AST: 22 U/L (ref 15–41)
Albumin: 3.5 g/dL (ref 3.5–5.0)
Alkaline Phosphatase: 86 U/L (ref 38–126)
Anion gap: 6 (ref 5–15)
BUN: 8 mg/dL (ref 8–23)
CO2: 27 mmol/L (ref 22–32)
Calcium: 9.1 mg/dL (ref 8.9–10.3)
Chloride: 106 mmol/L (ref 98–111)
Creatinine: 0.66 mg/dL (ref 0.44–1.00)
GFR, Estimated: >60 mL/min (ref >60)
Glucose, Bld: 133 mg/dL — ABNORMAL HIGH (ref 70–99)
Potassium: 3.4 mmol/L — ABNORMAL LOW (ref 3.5–5.1)
Sodium: 139 mmol/L (ref 135–145)
Total Bilirubin: 0.5 mg/dL (ref 0.3–1.2)
Total Protein: 6.9 g/dL (ref 6.5–8.1)

## 2022-08-24 LAB — CBC WITH DIFFERENTIAL (CANCER CENTER ONLY)
Abs Immature Granulocytes: 0.03 10*3/uL (ref 0.00–0.07)
Basophils Absolute: 0 10*3/uL (ref 0.0–0.1)
Basophils Relative: 1 %
Eosinophils Absolute: 0.1 10*3/uL (ref 0.0–0.5)
Eosinophils Relative: 2 %
HCT: 37.3 % (ref 36.0–46.0)
Hemoglobin: 12.4 g/dL (ref 12.0–15.0)
Immature Granulocytes: 1 %
Lymphocytes Relative: 11 %
Lymphs Abs: 0.5 10*3/uL — ABNORMAL LOW (ref 0.7–4.0)
MCH: 26.8 pg (ref 26.0–34.0)
MCHC: 33.2 g/dL (ref 30.0–36.0)
MCV: 80.6 fL (ref 80.0–100.0)
Monocytes Absolute: 0.4 10*3/uL (ref 0.1–1.0)
Monocytes Relative: 9 %
Neutro Abs: 3.5 10*3/uL (ref 1.7–7.7)
Neutrophils Relative %: 76 %
Platelet Count: 228 10*3/uL (ref 150–400)
RBC: 4.63 MIL/uL (ref 3.87–5.11)
RDW: 17.9 % — ABNORMAL HIGH (ref 11.5–15.5)
WBC Count: 4.6 10*3/uL (ref 4.0–10.5)
nRBC: 0 % (ref 0.0–0.2)

## 2022-08-24 LAB — TOTAL PROTEIN, URINE DIPSTICK: Protein, ur: NEGATIVE mg/dL

## 2022-08-24 MED ORDER — LATANOPROST 0.005 % OP SOLN
1.0000 [drp] | Freq: Every day | OPHTHALMIC | 6 refills | Status: DC
  Filled 2022-09-28: qty 7.5, 75d supply, fill #0
  Filled 2023-02-27 (×2): qty 7.5, 75d supply, fill #1
  Filled 2023-06-05: qty 7.5, 75d supply, fill #2

## 2022-08-24 MED ORDER — MIRTAZAPINE 7.5 MG PO TABS
7.5000 mg | ORAL_TABLET | Freq: Every day | ORAL | 0 refills | Status: DC
  Filled 2022-08-24: qty 30, 30d supply, fill #0

## 2022-08-24 MED ORDER — TRAMADOL HCL 50 MG PO TABS
50.0000 mg | ORAL_TABLET | Freq: Three times a day (TID) | ORAL | 0 refills | Status: DC | PRN
  Filled 2022-08-24: qty 15, 5d supply, fill #0
  Filled 2022-08-28: qty 15, 5d supply, fill #1
  Filled 2022-09-27: qty 15, 5d supply, fill #2
  Filled 2022-10-26: qty 15, 5d supply, fill #3

## 2022-08-24 MED ORDER — DORZOLAMIDE HCL-TIMOLOL MAL 2-0.5 % OP SOLN
1.0000 [drp] | Freq: Two times a day (BID) | OPHTHALMIC | 11 refills | Status: AC
  Filled 2022-09-28: qty 10, 50d supply, fill #0
  Filled 2022-09-28: qty 10, 90d supply, fill #0
  Filled 2022-10-02: qty 10, 50d supply, fill #0

## 2022-08-24 MED ORDER — BRIMONIDINE TARTRATE 0.2 % OP SOLN: 1.0000 [drp] | Freq: Two times a day (BID) | OPHTHALMIC | 11 refills | Status: AC

## 2022-08-24 NOTE — Progress Notes (Signed)
  CHIEF COMPLAINT: f/u of colon and rectal cancer     CURRENT THERAPY:   CAPEOX, q21d, starting 05/11/22             -Xeloda dose: '1000mg'$  AM, '1500mg'$  PM, days 1-14             -Xeloda changed to one week on/one week off starting 06/22/22 due to hand foot syndrome    Nutrition follow up completed with patient, Automotive engineer, and CAP interpreter, Adah Perl.  Chemo cancelled.   Weight decreased to 117 pounds, 14.4 oz from 124# 14.4 oz on Jan 18. 9% wt loss in less than 3 months which is significant.  Labs include K 3.4, Glucose 133.  Estimated Nutrition Needs: 1500-1700 kcal, 70-80 gm protein, >1.5 L fluid daily  Patient reports poor appetite persists. She has only been taking Compazine once daily at 6 am. Noted MD added mirtazapine to improve appetite. Reports drinking 2 Ensure daily. She eats one serving of Rice soup with fish daily. She will eat a piece of fruit when she drinks an ensure. She takes Prune Juice for constipation.  Nutrition Diagnosis: Food and Nutrition related knowledge deficit continues. Inadequate oral intake related to poor appetite as evidenced by 9% wt loss in less than 3 months.  Intervention: Educated to increase Ensure Plus or equivalent to 4 times daily. (~1400 kcal) Provided complimentary case of both Ensure Complete and [employer] 1.4 (Ensure plus samples are unavailable at this time) Provided coupons. Encouraged increase of food at meals. Reviewed anti emetics again with patient. Explained the importance of preventing nausea. Questions answered. Contact information provided.  Monitoring, Evaluation, Goals: Patient will increase calories and protein to minimize weight loss.  Next Visit: To be scheduled as needed.

## 2022-08-24 NOTE — Assessment & Plan Note (Signed)
-  Doppler on July 13, 2022 showed left acute DVT involving popliteal and peroneal vein, CTA chest was negative for PE. -She started Xarelto, tolerating well.

## 2022-08-24 NOTE — Assessment & Plan Note (Signed)
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 -Despite strong recommendation, patient firmly and repeatedly declined IV chemo and surgery. She agreed to chemoRT with Xeloda, and received the treatment 01/04/21 - 02/14/21. -she previously declined surveillance measures, however, she developed pencil-thin and decreased caliber of stool. Her CEA normalized after chemoRT but increased to 18.49 on 09/28/21. She agreed to work up, and CT AP on 10/07/21 showed enlargement of LLL nodule.  -Further work up with PET 11/24/21, showed: hypermetabolic LLL nodule; marked hypermetabolism corresponding to residual rectal wall thickening; no other evidence of hypermetabolic metastasis.  -bronchoscopy 12/13/21 by Dr. Valeta Harms, cytology negative for malignancy. Repeat biopsy was recommended, but she declined. -local recurrence confirmed by surveillance colonoscopy 01/2022. -she agreed to restart Xeloda and started on 02/16/22.  -restaging CT CAP on 04/17/22 showed: persistent rectal thickening extending 9.7 cm proximal to anorectal junction; no definite lymphadenopathy; slight increased size of superior LLL pulmonary nodule, currently 1.5 cm; no new pulmonary nodules or other metastatic disease.   -low dose oxaliplatin was added on 05/11/22 (with cycle 4 of Xeloda). She is tolerating well overall. -Restaging CT scan from July 14, 2022 showed improved lung metastasis, no other new lesions. -She has developed significant bilateral leg pain, on the right, to the point she had difficulty walking, oxaliplatin was held last cycle and she proceed with xeloda only

## 2022-08-24 NOTE — Congregational Nurse Program (Signed)
Accompanied patient to appointment with Dr. Burr Medico at [facility].  Patient states she continues to have problems with nausea and has lost 7 pounds since last visit due to inability to eat.  Will start taking Compazine every 6 hours until appetite improves. Will also start drinking more Ensure (currently only drinking 2 per day. CN  will contact patient's case manager at [employer] to see if they can provide any financial assistance in purchasing Ensure.  States she is  able to walk better but still has pain in lower back/buttocks and left leg. States tramadol helps so she was given a new prescription and also started on mirtazapine to increase appetite and help with sleep.  C-T scan scheduled for 09/07/2022 at [facility]. Patient was also seen by nutritionist Ernestene Kiel and given a case of Ensure as well as another supplement.  Called Medicaid transportation to pick patient up at conclusion of appointment. Jake Michaelis RN, Congregational Nurse [phone]

## 2022-08-24 NOTE — Assessment & Plan Note (Signed)
-  It started in Dec 2023, pain in bilateral buttocks and lower extremity, more on the right -Her pain cannot be explained completely by her left lower extremity DVT -pelvic MRI on 08/15/2022 showed no evidence of pulm metastasis, but showed new extensive abnormal signal in the sacral ala bilaterally which is most likely secondary to bilateral insufficiency fractures, potentially secondary to prior pelvic radiation for rectal cancer, and moderate multifactorial spinal stenosis at L4-S1 with moderate.  -pain management, will refer her to palliative care  -we discussed orth referral

## 2022-08-25 ENCOUNTER — Other Ambulatory Visit: Payer: Self-pay

## 2022-08-28 ENCOUNTER — Other Ambulatory Visit (HOSPITAL_COMMUNITY): Payer: Self-pay

## 2022-08-28 DIAGNOSIS — H40031 Anatomical narrow angle, right eye: Secondary | ICD-10-CM | POA: Diagnosis not present

## 2022-08-28 DIAGNOSIS — H25812 Combined forms of age-related cataract, left eye: Secondary | ICD-10-CM | POA: Diagnosis not present

## 2022-08-28 NOTE — Congregational Nurse Program (Signed)
CN accompanied patient to follow-up appointment with Dr. Wyatt Portela to recheck IOP's.  Right eye pressure is down from 58 on 08/23/2022 to 29 today.  Left eye pressure is 15 today.  Patient is to continue all eye drops as prescribed (Latanoprost, both eyes 1 drop in the evening, Dorzolamide-timolol, both eyes 1 drop twice a day, and Brimonidine, right eye 1 drop twice a day).  Right eye cataract surgery scheduled for 09/01/2022.  CN and interpreter will home visit patient on 08/30/2022 to go over pre-op instructions.  Jake Michaelis RN, Congregational Nurse [phone]

## 2022-08-29 ENCOUNTER — Telehealth: Payer: Self-pay

## 2022-08-29 NOTE — Telephone Encounter (Signed)
Phone call to [facility] Medicaid to schedule transportation.  Booked rides for 09/07/2022 1:30 appointment for C-T scan at [facility] and 09/14/2022 12:20 appointment with Dr. Burr Medico at [facility].  Jake Michaelis RN, Congregational Nurse [phone]

## 2022-08-30 NOTE — Congregational Nurse Program (Signed)
Home visit with interpreter [name].  Reviewed pre-op instructions for September 01, 2022 cataract surgery (rt. Eye). We also went over directions for using current eye drops.  States she did not receive pre-op eye drops which were ordered and mailed from [facility].  Phone call to pharmacy and was told that they were mailed last week but current location unknown.  They will ship a second bottle overnight to be delivered by [employer] tomorrow.  Jake Michaelis RN, Congregational Nurse [phone]

## 2022-08-31 ENCOUNTER — Other Ambulatory Visit: Payer: Self-pay | Admitting: Student

## 2022-08-31 DIAGNOSIS — R202 Paresthesia of skin: Secondary | ICD-10-CM

## 2022-08-31 NOTE — Telephone Encounter (Signed)
Next appt scheduled 09/06/22 with PCP.

## 2022-09-01 ENCOUNTER — Other Ambulatory Visit (HOSPITAL_COMMUNITY): Payer: Self-pay

## 2022-09-01 ENCOUNTER — Other Ambulatory Visit: Payer: Self-pay

## 2022-09-01 DIAGNOSIS — H2589 Other age-related cataract: Secondary | ICD-10-CM | POA: Diagnosis not present

## 2022-09-01 MED ORDER — GABAPENTIN 100 MG PO CAPS
100.0000 mg | ORAL_CAPSULE | Freq: Three times a day (TID) | ORAL | 2 refills | Status: DC | PRN
  Filled 2022-09-01: qty 90, 30d supply, fill #0
  Filled 2022-09-28: qty 90, 30d supply, fill #1
  Filled 2022-11-02 – 2022-11-06 (×3): qty 90, 30d supply, fill #2

## 2022-09-01 NOTE — Congregational Nurse Program (Signed)
Accompanied patient to [facility] for cataract surgery in Rt. Eye performed by Dr. Doran Stabler.  He stated the surgery went well and she has a follow-up appointment at his office with P.A. at 1:00 pm today.  She was given discharge instructions with an interpreter present.  CN picked up patient's routine prescription medicines at [place]. [employer]. And took to patient's home.  These included gabapentin 100 mg, amlodipine 5 mg and lisinopril-hctz 20-12.5 mg.  Her next appointment with Dr. Katy Fitch is 09/11/2022 at 12:45 pm.  Jake Michaelis RN,  Congregational Nurse [phone]

## 2022-09-02 ENCOUNTER — Telehealth: Payer: Self-pay

## 2022-09-02 NOTE — Telephone Encounter (Signed)
Phone call to patient to follow-up on yesterday's cataract surgery.  States she I having no pain and no other problems.  She did have questions about  frequency of Omni combination drop.  She did use it until 9:00 am today so she will use at 1:00, 5:00 and 9:00 pm.  Tomorrow states she will aim for a 7:00 am, 1:00 pm, 7:00 pm and 1:00 am.  Supported this idea and told her not to worry if she did not use them exactly every 6 hours as long as she spaced them over the day and got 4 doses in. Advised patient to call CN if she has any concerns. Reminded her of PCP follow-up appointment on 09/06/2022 @ 1:45 pm with [facility] and C-T scan appointment at [facility] radiology on 09/07/22 @ 1:30 pm.  Medicaid transportation has been arranged but patient states she is staying with a friend at a different address for the next week following her surgery.  ([place] [phone]).  CN will contact transportation to change pick-up location.  Jake Michaelis RN, Congregational Nurse [phone]

## 2022-09-06 ENCOUNTER — Ambulatory Visit: Payer: Medicaid Other | Admitting: Student

## 2022-09-06 ENCOUNTER — Encounter: Payer: Self-pay | Admitting: Student

## 2022-09-06 VITALS — BP 129/71 | HR 74 | Temp 98.2°F | Ht 59.0 in | Wt 114.0 lb

## 2022-09-06 DIAGNOSIS — I10 Essential (primary) hypertension: Secondary | ICD-10-CM

## 2022-09-06 DIAGNOSIS — C20 Malignant neoplasm of rectum: Secondary | ICD-10-CM

## 2022-09-06 DIAGNOSIS — R634 Abnormal weight loss: Secondary | ICD-10-CM

## 2022-09-06 DIAGNOSIS — Z0000 Encounter for general adult medical examination without abnormal findings: Secondary | ICD-10-CM

## 2022-09-06 LAB — POC HEMOCCULT BLD/STL (OFFICE/1-CARD/DIAGNOSTIC): Fecal Occult Blood, POC: POSITIVE — AB

## 2022-09-06 NOTE — Progress Notes (Signed)
CC: Constipation and weight loss  HPI:  [name] is a 72 y.o. living with hypertension, rectal adenocarcinoma, who presents to the clinic for evaluation of constipation and weight loss.  Please see problem based charting for detail  Past Medical History:  Diagnosis Date   Bilateral wrist pain 10/25/2017   Hypertension    Neck mass 1998   Unknown biopsy results. Excised in [place].   Pre-diabetes    Rectal cancer ([place])    Trigger finger, acquired 02/08/2017   Review of Systems:  per HPI  Physical Exam:  Vitals:   09/06/22 1334  BP: 129/71  Pulse: 74  Temp: 98.2 F (36.8 C)  TempSrc: Oral  SpO2: 98%  Weight: 114 lb (51.7 kg)  Height: 4' 11"$  (1.499 m)   Physical Exam Constitutional:      General: She is not in acute distress.    Appearance: She is not ill-appearing.  Eyes:     General:        Right eye: No discharge.        Left eye: No discharge.     Conjunctiva/sclera: Conjunctivae normal.  Cardiovascular:     Rate and Rhythm: Normal rate and regular rhythm.  Pulmonary:     Effort: Pulmonary effort is normal. No respiratory distress.     Breath sounds: Normal breath sounds. No wheezing.  Abdominal:     General: Bowel sounds are normal. There is no distension.     Palpations: Abdomen is soft.     Tenderness: There is no abdominal tenderness.  Genitourinary:    Comments: Normal appearance of the anus.  No mass palpated on digital exam.  No pain with palpation.  No visible hematochezia.  Stool was brown. Skin:    General: Skin is warm.  Neurological:     Mental Status: She is alert. Mental status is at baseline.  Psychiatric:        Mood and Affect: Mood normal.      Assessment & Plan:   See Encounters Tab for problem based charting.  Essential hypertension Blood pressure well-controlled at 129/71.  -Continue lisinopril-HCTZ 20-12.5 mg daily and amlodipine 10 mg daily CMP checked 2 weeks ago at oncology office was unremarkable  Weight  loss Her weight is consistently trending down slowly.  Today she weighs 114 pounds with BMI of 23.  Patient reports poor appetite but denies any dysphagia.  She said that her constipation is affecting her appetite.  She was prescribed mirtazapine for appetite stimulation but has not been taking it.  She thought this medication was only for insomnia.  -Advised patient to resume mirtazapine consistently. -Advised patient to take additional Ensure for supplement -If her weight fails to improve, we may need to discuss artificial nutrition vs palliative  Rectal adenocarcinoma ([place]) History of rectal cancer diagnosed in 2022 status postradiation and chemotherapy.  Currently on Xeloda.  Last CT chest abdomen/pelvis in December 2023 showed grossly similar rectal mass with increased perirectal edema and vascularity.  Reports ongoing constipation.  She has many multiple BMs a day with small stool caliber.  Reported rectal pain with bowel movement since Monday.  Also reports tenesmus symptoms and hematochezia.  She is taking MiraLAX, milk of magnesia and prune juice daily.  Digital exam did not reveal any obstructing mass.  She has no pain with palpation of the rectal wall.  No visible hematochezia.  The stool was brown.  FOBT positive which is not uncommon in the setting of rectal cancer  I  think this is not really constipation if she has multiple stools a day.  Her poor p.o. intake can lead to smaller stool caliber.  We rule out obstructing mass or radiation proctitis.  Patient will obtain CT scan that was scheduled by oncology tomorrow.  Continue MiraLAX, milk of magnesium and prune juice for now.  Healthcare maintenance -Patient would like to hold off on mammogram and bone scan in setting of active treatment for rectal cancer   Patient discussed with Dr. [name]

## 2022-09-06 NOTE — Patient Instructions (Signed)
Jillian Lutz,  It was nice seeing you in the clinic today.  1.  Please continue MiraLAX, milk of magnesium and prune juice to help with your bowel movements.  2.  Please start taking mirtazapine every night.  This will help with your sleep and also your appetite.  You can also drink Ensure for supplement.  3.  Please follow-up with the CT scan tomorrow.  This will tell us how big is the cancer.  Please return in 3 months  Dr. Alfonse Spruce

## 2022-09-06 NOTE — Assessment & Plan Note (Addendum)
History of rectal cancer diagnosed in 2022 status postradiation and chemotherapy.  Currently on Xeloda.  Last CT chest abdomen/pelvis in December 2023 showed grossly similar rectal mass with increased perirectal edema and vascularity.  Reports ongoing constipation.  She has many multiple BMs a day with small stool caliber.  Reported rectal pain with bowel movement since Monday.  Also reports tenesmus symptoms and hematochezia.  She is taking MiraLAX, milk of magnesia and prune juice daily.  Digital exam did not reveal any obstructing mass.  She has no pain with palpation of the rectal wall.  No visible hematochezia.  The stool was brown.  FOBT positive which is not uncommon in the setting of rectal cancer  I think this is not really constipation if she has multiple stools a day.  Her poor p.o. intake can lead to smaller stool caliber.  We rule out obstructing mass or radiation proctitis.  Patient will obtain CT scan that was scheduled by oncology tomorrow.  Continue MiraLAX, milk of magnesium and prune juice for now.

## 2022-09-06 NOTE — Assessment & Plan Note (Addendum)
Blood pressure well-controlled at 129/71.  -Continue lisinopril-HCTZ 20-12.5 mg daily and amlodipine 10 mg daily CMP checked 2 weeks ago at oncology office was unremarkable

## 2022-09-06 NOTE — Assessment & Plan Note (Signed)
-  Patient would like to hold off on mammogram and bone scan in setting of active treatment for rectal cancer

## 2022-09-06 NOTE — Assessment & Plan Note (Addendum)
Her weight is consistently trending down slowly.  Today she weighs 114 pounds with BMI of 23.  Patient reports poor appetite but denies any dysphagia.  She said that her constipation is affecting her appetite.  She was prescribed mirtazapine for appetite stimulation but has not been taking it.  She thought this medication was only for insomnia.  -Advised patient to resume mirtazapine consistently. -Advised patient to take additional Ensure for supplement -If her weight fails to improve, we may need to discuss artificial nutrition vs palliative

## 2022-09-07 ENCOUNTER — Ambulatory Visit (HOSPITAL_COMMUNITY)
Admission: RE | Admit: 2022-09-07 | Discharge: 2022-09-07 | Disposition: A | Discharge status: Home / Self Care | Payer: Medicaid Other | Source: Ambulatory Visit | Attending: Hematology | Admitting: Hematology

## 2022-09-07 DIAGNOSIS — C20 Malignant neoplasm of rectum: Secondary | ICD-10-CM | POA: Diagnosis not present

## 2022-09-07 DIAGNOSIS — I700 Atherosclerosis of aorta: Secondary | ICD-10-CM | POA: Diagnosis not present

## 2022-09-07 DIAGNOSIS — K6289 Other specified diseases of anus and rectum: Secondary | ICD-10-CM | POA: Diagnosis not present

## 2022-09-07 DIAGNOSIS — R911 Solitary pulmonary nodule: Secondary | ICD-10-CM | POA: Diagnosis not present

## 2022-09-07 DIAGNOSIS — J984 Other disorders of lung: Secondary | ICD-10-CM | POA: Diagnosis not present

## 2022-09-07 MED ORDER — IOHEXOL 9 MG/ML PO SOLN
ORAL | Status: AC
  Filled 2022-09-07: qty 1000

## 2022-09-07 MED ORDER — SODIUM CHLORIDE (PF) 0.9 % IJ SOLN
INTRAMUSCULAR | Status: AC
  Filled 2022-09-07: qty 50

## 2022-09-07 MED ORDER — IOHEXOL 9 MG/ML PO SOLN: 500.0000 mL | ORAL | Status: AC

## 2022-09-07 MED ORDER — IOHEXOL 300 MG/ML  SOLN
100.0000 mL | Freq: Once | INTRAMUSCULAR | Status: AC | PRN
  Administered 2022-09-07: 100 mL via INTRAVENOUS

## 2022-09-11 ENCOUNTER — Other Ambulatory Visit (HOSPITAL_COMMUNITY): Payer: Self-pay

## 2022-09-11 NOTE — Congregational Nurse Program (Signed)
Accompanied patient to appointment with Dr. Wyatt Portela for right cataract surgery follow-up.  Interpreter [name] assisted by phone. 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  She did agree to proceed with cataract surgery on the left eye and it is scheduled for 11/10/2022 @ 8:30 am. She is to continue eye drops as ordered except discontinue the Brimonidine drops in right eye.  Reminded patient of 09/14/2022 appointment with Dr. Burr Medico and that transportation has been requested.  Jake Michaelis RN, [place] Nurse [phone]

## 2022-09-14 ENCOUNTER — Other Ambulatory Visit (HOSPITAL_COMMUNITY): Payer: Self-pay

## 2022-09-14 ENCOUNTER — Other Ambulatory Visit: Payer: Self-pay

## 2022-09-14 ENCOUNTER — Encounter: Payer: Self-pay | Admitting: Hematology

## 2022-09-14 ENCOUNTER — Inpatient Hospital Stay (HOSPITAL_BASED_OUTPATIENT_CLINIC_OR_DEPARTMENT_OTHER): Payer: Medicaid Other | Admitting: Hematology

## 2022-09-14 VITALS — BP 154/85 | HR 63 | Temp 98.3°F | Resp 16 | Ht 59.0 in | Wt 115.9 lb

## 2022-09-14 DIAGNOSIS — M79604 Pain in right leg: Secondary | ICD-10-CM | POA: Diagnosis not present

## 2022-09-14 DIAGNOSIS — I82432 Acute embolism and thrombosis of left popliteal vein: Secondary | ICD-10-CM | POA: Diagnosis not present

## 2022-09-14 DIAGNOSIS — M79605 Pain in left leg: Secondary | ICD-10-CM | POA: Diagnosis not present

## 2022-09-14 DIAGNOSIS — C20 Malignant neoplasm of rectum: Secondary | ICD-10-CM | POA: Diagnosis not present

## 2022-09-14 MED ORDER — MIRTAZAPINE 15 MG PO TABS
15.0000 mg | ORAL_TABLET | Freq: Every day | ORAL | 1 refills | Status: DC
  Filled 2022-09-14 – 2022-11-02 (×3): qty 30, 30d supply, fill #0
  Filled 2022-11-02: qty 30, 30d supply, fill #1
  Filled 2022-11-06: qty 30, 30d supply, fill #0

## 2022-09-14 NOTE — Congregational Nurse Program (Signed)
Accompanied patient to appointment with Dr. Burr Medico at [facility].  Patient chose to take a 2 month break from chemo due to fatigue and loss of appetite.  Stated that she had more energy this past week but still has no appetite.  Dr. Burr Medico increased her mirtazapine from 7.5 to 15 mg.  She will take 2 of the current medication until finished.  CN will pick up the new 15 mg prescription from [place] for patient. CN informed Dr. Burr Medico of cataract surgery on right eye (09/01/2022) and pending surgery on the left eye scheduled for 11/10/2022 with Dr. Wyatt Portela.  CN called Medicaid transportation to take patient home.  Jake Michaelis RN, Congregational Nurse [phone].

## 2022-09-14 NOTE — Assessment & Plan Note (Signed)
-  Doppler on July 13, 2022 showed left acute DVT involving popliteal and peroneal vein, CTA chest was negative for PE. -She started Xarelto, tolerating well

## 2022-09-14 NOTE — Assessment & Plan Note (Signed)
-  started in Dec 2023 -MRI 08/15/22 showed new extensive abnormal signal in the sacral ala bilaterally which is most likely secondary to bilateral insufficiency fractures -pain has overall improved

## 2022-09-14 NOTE — Assessment & Plan Note (Addendum)
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 -Despite strong recommendation, patient firmly and repeatedly declined IV chemo and surgery. She agreed to chemoRT with Xeloda, and received the treatment 01/04/21 - 02/14/21. -she developed local cancer progression and lung mets in 11/2021 -she started Xeloda on 02/16/2022, low dose oxaliplatin was added on 05/11/22 (with cycle 4 of Xeloda)  -chemo held since 07/2022 due to leg pain and recurrent nausea  -restaging CT 09/07/2022 showed stable disease in rectum and lung, no new lesions

## 2022-09-14 NOTE — Progress Notes (Signed)
[facility]   [phone] [phone]   Clinic Follow up Note   Patient Care Team: Gaylan Gerold, DO as PCP - General (Internal Medicine) Jonnie Finner, RN (Inactive) as Oncology Nurse Navigator Alla Feeling, NP as Nurse Practitioner (Nurse Practitioner) [name], MD as Consulting Physician (Hematology and Oncology) Kyung Rudd, MD as Consulting Physician (Radiation Oncology) [name], Telford Nab., MD as Consulting Physician (Gastroenterology) Garner Nash, DO as Consulting Physician (Pulmonary Disease) Michael Boston, MD as Consulting Physician (General Surgery)  Date of Service:  09/14/2022  CHIEF COMPLAINT: f/u of colon and rectal cancer      CURRENT THERAPY:  Chemo break     ASSESSMENT:  Jillian Lutz is a 72 y.o. female with   Rectal adenocarcinoma ([place]) cT3cN1M0 stage IIIb, biopsy confirmed residual primary tumor and oligo lung met in 09/2021 -Initially diagnosed 11/2020 by colonoscopy for progressive rectal pain and bleeding, weight loss, and constipation. -Despite strong recommendation, patient firmly and repeatedly declined IV chemo and surgery. She agreed to chemoRT with Xeloda, and received the treatment 01/04/21 - 02/14/21. -she developed local cancer progression and lung mets in 11/2021 -she started Xeloda on 02/16/2022, low dose oxaliplatin was added on 05/11/22 (with cycle 4 of Xeloda)  -chemo held since 07/2022 due to leg pain and recurrent nausea  -restaging CT 09/07/2022 showed stable disease in rectum and lung, no new lesions.  I personally reviewed her CT scan images and compared to the previous scans, her lung lesion is minimal now she has had excellent response so far. -Due to her significant fatigue, limited performance status, partially related to chemotherapy and recent sacrum fracture, will give her 2 months of chemo break.  Left leg DVT ([facility]) -Doppler on July 13, 2022 showed left acute DVT involving popliteal and  peroneal vein, CTA chest was negative for PE. -She started Xarelto, tolerating well  Leg pain -started in Dec 2023 -MRI 08/15/22 showed new extensive abnormal signal in the sacral ala bilaterally which is most likely secondary to bilateral insufficiency fractures -pain has overall improved      PLAN: -Discuss CT scan -stable disease since last scan, overall excellent partial response compared to last year -I discuss with the pt whether she wants to start back taking the Oral chemo pill or did the pt wanted a break to balance her quality of life. Pt wanted a Break. -I refill Mirtazapine for appetite, will increase dose to 50 mg at night -f/u in first week of May, plan to restart Xeloda after next visit.    SUMMARY OF ONCOLOGIC HISTORY: Oncology History Overview Note  Cancer Staging Rectal adenocarcinoma [facility]) Staging form: Colon and Rectum, AJCC 8th Edition - Clinical stage from 12/21/2020: Stage IIIB (cT3, cN1, cM0) - Unsigned    Rectal adenocarcinoma ([place])  08/26/2020 Miscellaneous   Initial presentation to PCP, reporting intermittent BRBPR since 02/2020    12/01/2020 Procedure   Colonoscopy by Dr. Silverio Decamp findings - The perianal and digital rectal examinations were normal. - A 15 mm polyp was found in the ascending colon. The polyp was semi-pedunculated. Resection and retrieval were complete. - A 25 mm polyp was found in the sigmoid colon. The polyp was pedunculated. Resection and retrieval were complete. - An infiltrative partially obstructing large mass was found in the proximal rectum. The mass was partially circumferential (involving one-half of the lumen circumference). The mass measured eight cm in length extending from 10 to 18cm from anal verge. This was biopsied with a cold forceps  for histology. Proximal and distal opposite fold area of the mass lesion was tattooed with an injection of total 3 mL of Spot (carbon black).   12/01/2020 Initial Biopsy   Diagnosis 1. Colon,  polyp(s), ascending x 1 - ADENOCARCINOMA ARISING IN A TUBULAR ADENOMA WITH HIGH-GRADE DYSPLASIA. SEE NOTE 2. Colon, sigmoid polyp, x1 - TUBULOVILLOUS ADENOMA(S) - NEGATIVE FOR HIGH-GRADE DYSPLASIA OR MALIGNANCY 3. Rectum, biopsy - ADENOCARCINOMA. SEE NOTE   12/01/2020 Cancer Staging   Cancer Staging Rectal adenocarcinoma [facility]) Staging form: Colon and Rectum, AJCC 8th Edition - Clinical stage from 12/21/2020: Stage IIIB (cT3, cN1, cM0) - Unsigned    12/06/2020 Imaging   CT CAP with contrast IMPRESSION: 1. There is partially circumferential soft tissue thickening of the mid to superior rectum, approximately 5 cm in length and the inferior extent approximately 6 cm above the anal verge, consistent with primary rectal malignancy identified by colonoscopy. 2. There appear to be abnormally enlarged perirectal lymph nodes posteriorly about the superior rectum measuring up to 1.0 x 0.8 cm. Findings are suspicious for perirectal nodal metastatic disease, however rectal MRI is the test of choice for initial local staging of rectal cancer. 3. There is a 4 mm nonspecific pulmonary nodule of the superior segment left lower lobe, statistically most likely incidental, infectious or inflammatory, although nonspecific and isolated metastatic disease is not strictly excluded. Attention on follow-up. 4. No other evidence of metastatic disease in the chest, abdomen, or pelvis. Aortic Atherosclerosis (ICD10-I70.0).   12/09/2020 Imaging   Local staging MRI pelvis without contrast IMPRESSION: 4.9 cm circumferential mid/lower rectal mass, corresponding to the patient's newly diagnosed rectal cancer. Rectal adenocarcinoma T stage: T3c Rectal adenocarcinoma N stage:  N1 Distance from tumor to the internal anal sphincter is 3.7 cm.   12/21/2020 Initial Diagnosis   Rectal adenocarcinoma ([place])   01/04/2021 -  Chemotherapy   Concurrent chemoradiation with Xeloda '1000mg'$  in the AM and '1500mg'$  in the PM on days  of Radiation starting 01/04/21.       01/04/2021 - 02/11/2021 Radiation Therapy   Concurrent chemoradiation with Dr Lisbeth Renshaw and Xeloda starting 01/04/21.    01/31/2022 Procedure   Colonoscopy, Dr. Silverio Decamp  Findings: - An infiltrative partially obstructing large mass was found in the rectum. The mass was circumferential. The mass measured ten cm in length, extending from 5-15cm from anal verge. In addition, rectal diameter at narrowest approx twelve mm. Oozing was present. Biopsies were taken with a cold forceps for histology.  Impression: - Hemorrhoids found on perianal exam. - Likely malignant partially obstructing tumor in the rectum. Biopsied. - Non-bleeding external and internal hemorrhoids.   01/31/2022 Pathology Results   Diagnosis Rectum, biopsy INVASIVE MODERATELY DIFFERENTIATED ADENOCARCINOMA   05/11/2022 -  Chemotherapy   Patient is on Treatment Plan : COLORECTAL Xelox (Capeox)(130/850) q21d     09/07/2022 Imaging    IMPRESSION: 1. No significant change in circumferential wall thickening of the rectum, in keeping with known primary rectal mass. Similar appearance of post treatment perirectal and presacral fat stranding and fascial thickening 2. Unchanged nodule of the superior segment left lower lobe with adjacent bandlike scarring. No new nodules. 3. No evidence of lymphadenopathy or other metastatic disease in the chest, abdomen, or pelvis. 4. Large burden of stool throughout the colon. 5. Cardiomegaly.      INTERVAL HISTORY:  Jillian Lutz is here for a follow up of  colon and rectal cancer    She was last seen by me on 08/24/2022 She presents to the  clinic accompanied by interpreter. Pt states she is walking a little better.Pain has improved. Pt also stated that she was tired all last week, but this week she has some energy and as eating a little more. Pt states she has no question. Pt states she wants a little longer break from chemo to recover. Pt having left eye  surgery on 11/10/2022    All other systems were reviewed with the patient and are negative.  MEDICAL HISTORY:  Past Medical History:  Diagnosis Date   Bilateral wrist pain 10/25/2017   Hypertension    Neck mass 1998   Unknown biopsy results. Excised in [place].   Pre-diabetes    Rectal cancer ([place])    Trigger finger, acquired 02/08/2017    SURGICAL HISTORY: Past Surgical History:  Procedure Laterality Date   BRONCHIAL BIOPSY  12/13/2021   Procedure: BRONCHIAL BIOPSIES;  Surgeon: Garner Nash, DO;  Location: [facility];  Service: Pulmonary;;   BRONCHIAL NEEDLE ASPIRATION BIOPSY  12/13/2021   Procedure: BRONCHIAL NEEDLE ASPIRATION BIOPSIES;  Surgeon: Garner Nash, DO;  Location: [facility];  Service: Pulmonary;;   NECK SURGERY Left 1998   Excision of mass in [place]   VIDEO BRONCHOSCOPY WITH RADIAL ENDOBRONCHIAL ULTRASOUND  12/13/2021   Procedure: RADIAL ENDOBRONCHIAL ULTRASOUND;  Surgeon: Garner Nash, DO;  Location: [facility];  Service: Pulmonary;;    I have reviewed the social history and family history with the patient and they are unchanged from previous note.  ALLERGIES:  is allergic to no known allergies.  MEDICATIONS:  Current Outpatient Medications  Medication Sig Dispense Refill   mirtazapine (REMERON) 15 MG tablet Take 1 tablet (15 mg total) by mouth at bedtime. 30 tablet 1   amLODipine (NORVASC) 5 MG tablet Take 1 tablet (5 mg total) by mouth daily. 90 tablet 3   brimonidine (ALPHAGAN) 0.2 % ophthalmic solution Place 1 drop into the right eye 2 (two) times daily. 15 mL 3   brimonidine (ALPHAGAN) 0.2 % ophthalmic solution Place 1 drop into the right eye 2 (two) times daily. 10 mL 11   capecitabine (XELODA) 500 MG tablet Take 2 tablets (1000 mg) by mouth in morning and 3 tablets (1500 mg) by mouth in evening. Take within 30 minutes after meals. Take for 14 days on, 7 days off. Repeat every 21 days. 70 tablet 0   dorzolamide-timolol (COSOPT) 2-0.5 %  ophthalmic solution Place 1 drop into both eyes 2 times daily. 30 mL 3   dorzolamide-timolol (COSOPT) 2-0.5 % ophthalmic solution Instill 1 drop into both eyes twice a Jillian 10 mL 11   gabapentin (NEURONTIN) 100 MG capsule Take 1 capsule (100 mg total) by mouth 3 (three) times daily as needed. 90 capsule 2   latanoprost (XALATAN) 0.005 % ophthalmic solution Instill 1 drop into both eyes every night at bedtime 7.5 mL 3   latanoprost (XALATAN) 0.005 % ophthalmic solution Place 1 drop into both eyes at bedtime. 7.5 mL 6   lisinopril-hydrochlorothiazide (ZESTORETIC) 20-12.5 MG tablet Take 2 tablets by mouth daily. 60 tablet 3   Multiple Vitamins-Minerals (MULTIVITAMIN WITH MINERALS) tablet Take 1 tablet by mouth daily. Gummy 50 mg     polyethylene glycol powder (GLYCOLAX/MIRALAX) 17 GM/SCOOP powder Take 1 Container by mouth once.     prochlorperazine (COMPAZINE) 10 MG tablet Take 1 tablet (10 mg total) by mouth every 6 (six) hours as needed. 30 tablet 2   rivaroxaban (XARELTO) 20 MG TABS tablet Take 1 tablet (20 mg total) by mouth  daily with supper. 30 tablet 2   traMADol (ULTRAM) 50 MG tablet Take 1 tablet (50 mg total) by mouth every 8 (eight) hours as needed. 60 tablet 0   urea (CARMOL) 10 % cream Apply topically 2 times daily as needed. 85 g 3   No current facility-administered medications for this visit.    PHYSICAL EXAMINATION: ECOG PERFORMANCE STATUS: 2 - Symptomatic, <50% confined to bed  Vitals:   09/14/22 1222  BP: (!) 154/85  Pulse: 63  Resp: 16  Temp: 98.3 F (36.8 C)  SpO2: 99%   Wt Readings from Last 3 Encounters:  09/14/22 115 lb 14.4 oz (52.6 kg)  09/06/22 114 lb (51.7 kg)  08/24/22 117 lb 14.4 oz (53.5 kg)     GENERAL:alert, no distress and comfortable SKIN: skin color normal, no rashes or significant lesions EYES: normal, Conjunctiva are pink and non-injected, sclera clear  NEURO: alert & oriented x 3 with fluent speech  LABORATORY DATA:  I have reviewed the data  as listed    Latest Ref Rng & Units 08/24/2022    9:28 AM 08/03/2022    8:54 AM 07/13/2022    9:34 AM  CBC  WBC 4.0 - 10.5 K/uL 4.6  3.1  3.0   Hemoglobin 12.0 - 15.0 g/dL 12.4  11.7  11.1   Hematocrit 36.0 - 46.0 % 37.3  35.4  32.4   Platelets 150 - 400 K/uL 228  182  151         Latest Ref Rng & Units 08/24/2022    9:28 AM 08/03/2022    8:54 AM 07/13/2022    9:34 AM  CMP  Glucose 70 - 99 mg/dL 133  137  147   BUN 8 - 23 mg/dL '8  7  11   '$ Creatinine 0.44 - 1.00 mg/dL 0.66  0.62  0.61   Sodium 135 - 145 mmol/L 139  141  138   Potassium 3.5 - 5.1 mmol/L 3.4  3.6  3.7   Chloride 98 - 111 mmol/L 106  105  105   CO2 22 - 32 mmol/L '27  30  29   '$ Calcium 8.9 - 10.3 mg/dL 9.1  9.2  8.8   Total Protein 6.5 - 8.1 g/dL 6.9  6.9  6.6   Total Bilirubin 0.3 - 1.2 mg/dL 0.5  0.4  0.4   Alkaline Phos 38 - 126 U/L 86  83  60   AST 15 - 41 U/L '22  24  20   '$ ALT 0 - 44 U/L '5  6  5       '$ RADIOGRAPHIC STUDIES: I have personally reviewed the radiological images as listed and agreed with the findings in the report. No results found.    No orders of the defined types were placed in this encounter.  All questions were answered. The patient knows to call the clinic with any problems, questions or concerns. No barriers to learning was detected. The total time spent in the appointment was 30 minutes.     [name], MD 09/14/2022   Felicity Coyer, CMA, am acting as scribe for [name], MD.   I have reviewed the above documentation for accuracy and completeness, and I agree with the above.

## 2022-09-15 ENCOUNTER — Other Ambulatory Visit (HOSPITAL_COMMUNITY): Payer: Self-pay

## 2022-09-15 ENCOUNTER — Other Ambulatory Visit: Payer: Self-pay

## 2022-09-15 NOTE — Addendum Note (Signed)
Addended by: Gilles Chiquito B on: 09/15/2022 10:10 AM   Modules accepted: Level of Service

## 2022-09-15 NOTE — Progress Notes (Signed)
Internal Medicine Clinic Attending  Case discussed with Dr. Alfonse Spruce  at the time of the visit.  We reviewed the resident's history and exam and pertinent patient test results.  I agree with the assessment, diagnosis, and plan of care documented in the resident's note.

## 2022-09-18 ENCOUNTER — Other Ambulatory Visit (HOSPITAL_COMMUNITY): Payer: Self-pay

## 2022-09-20 ENCOUNTER — Telehealth: Payer: Self-pay

## 2022-09-20 NOTE — Telephone Encounter (Signed)
Phone call to patient with interpreter [name] assisting.  Patient was very cheerful stating she is sleeping well and her appetite and energy level have improved.  She is taking Mirtazapine 7.5 mg 2 tablets at bedtime per Dr. Burr Medico and states she has 14 tables remaining.  CN will deliver new prescription for 15 mg tablets next week.  Jake Michaelis RN, Congregational Nurse [phone]

## 2022-09-21 ENCOUNTER — Other Ambulatory Visit (HOSPITAL_COMMUNITY): Payer: Self-pay

## 2022-09-28 ENCOUNTER — Other Ambulatory Visit: Payer: Self-pay

## 2022-09-28 ENCOUNTER — Other Ambulatory Visit (HOSPITAL_COMMUNITY): Payer: Self-pay

## 2022-10-02 ENCOUNTER — Other Ambulatory Visit (HOSPITAL_COMMUNITY): Payer: Self-pay

## 2022-10-02 ENCOUNTER — Telehealth: Payer: Self-pay

## 2022-10-02 NOTE — Telephone Encounter (Signed)
Phone call to patient regarding delivering medications which were picked up today from  [facility] on [address].  Patient states she is out of town for a funeral and will call CN when she returns. Jake Michaelis RN, Congregational Nurse [phone]

## 2022-10-04 IMAGING — PT NM PET TUM IMG INITIAL (PI) SKULL BASE T - THIGH
1 series · 3 of 3 positions shown · non-contrast
Comparison: Abdominopelvic CT 10/07/2021.  Chest CT 10/26/2021.

CLINICAL DATA: Subsequent treatment strategy for history of rectal
cancer, status post chemotherapy and radiation therapy. Rising CEA.

EXAM:
NUCLEAR MEDICINE PET SKULL BASE TO THIGH
TECHNIQUE: 5.8 mCi F-18 FDG was injected intravenously. Full-ring PET imaging
was performed from the skull base to thigh after the radiotracer. CT
data was obtained and used for attenuation correction and anatomic
localization.
Fasting blood glucose: 106 mg/dl

[Series 1094: results mm oncology reading · 3.0mm · 0.94mm/px · 3 of 3 slices shown]
[im 1/3]
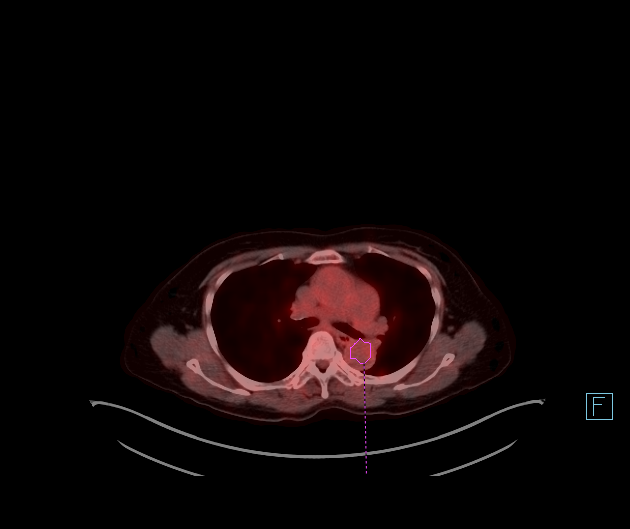
[im 2/3]
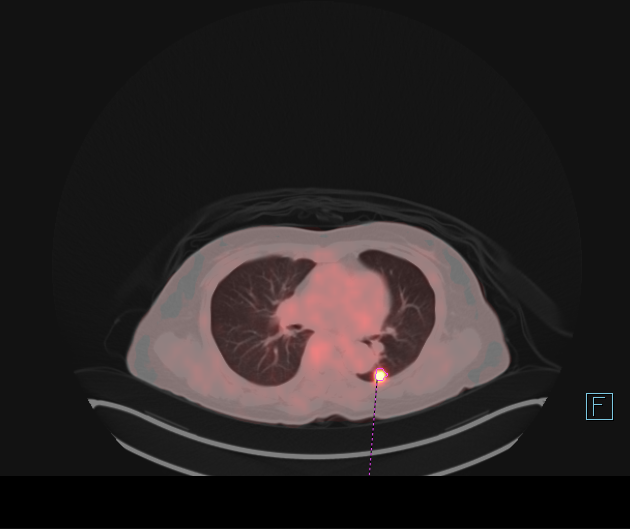
[im 3/3]
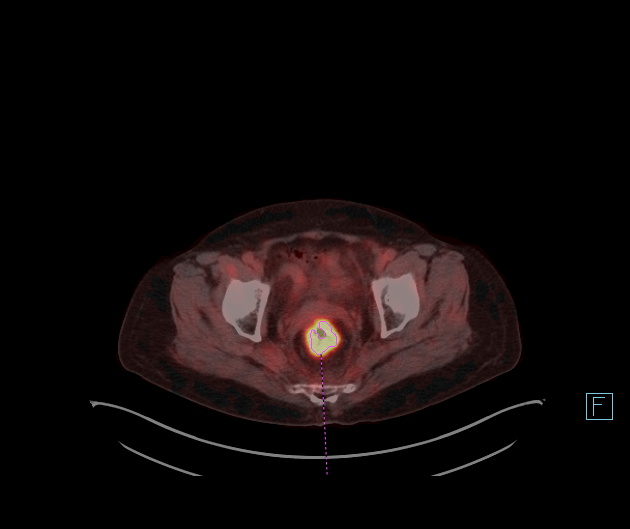

[3 of 3 positions shown; findings below may reference images not displayed]

FINDINGS: Mediastinal blood pool activity: SUV max

Liver activity: SUV max NA

NECK: No areas of abnormal hypermetabolism.

Incidental CT findings: No cervical adenopathy. Left carotid
atherosclerosis.

CHEST: No thoracic nodal hypermetabolism. The left lower lobe
pulmonary nodule of interest measures 11 mm and a S.U.V. max of
on [DATE].

Incidental CT findings: Deferred to recent diagnostic CT.
Cardiomegaly accentuated by a pectus excavatum deformity. Aortic
atherosclerosis.

ABDOMEN/PELVIS: No abdominopelvic nodal hypermetabolism.
Hypermetabolism corresponding to moderate relatively long segment
rectal wall thickening. Example at a S.U.V. max of 21.7 on 151/4.

Incidental CT findings: Normal adrenal glands. Normal noncontrast
appearance of the liver, gallbladder, pancreas, spleen, kidneys.
Abdominal aortic atherosclerosis. Apparent bladder wall thickening
in the setting of underdistention.

SKELETON: No abnormal marrow activity.

Incidental CT findings: none
IMPRESSION: 1. Hypermetabolic left lower lobe pulmonary nodule with differential
considerations of isolated pulmonary metastasis versus metachronous
primary bronchogenic carcinoma.
2. Marked hypermetabolism corresponding to residual rectal wall
thickening. The extent of rectal hypermetabolism favors residual
disease over radiation change.
3. No other evidence of hypermetabolic metastasis.
4. Apparent bladder wall thickening could be due to underdistention,
radiation induced cystitis, or infectious cystitis. Consider
correlation with urinalysis.

## 2022-10-09 NOTE — Congregational Nurse Program (Signed)
Home visit to deliver medications which were picked up from [facility].  Interpreter [name] assisted by phone.  The following medications were delivered: Xarelto 20 mg, lisinopril-hctz 20-12.5 mg, gabapentin 100 mg, amlodipine 5 mg, tramadol 50 mg, latanoprost 0.005% eye drops and dorzolamide-timolol 2-0.5% eye drops.  Patient stated she thought she no longer needed to use eye drops.   We explained that the current drops are for the elevated pressure (glaucoma) in both eyes and that she continues them until Dr. Katy Fitch says otherwise.  Explained that she will have additional drops to use next month when cataract surgery is scheduled in the other eye.  Expressed understanding.  States she has been constipated despite using miralax and and MOM daily.  Will try magnesium citrate tomorrow.  Denies blood in stool.  Will follow-up in 2 days regarding BM's and abdominal discomfort.   Jake Michaelis RN,  Congregational Nurse [phone]

## 2022-10-10 ENCOUNTER — Encounter: Payer: Self-pay | Admitting: *Deleted

## 2022-10-11 ENCOUNTER — Telehealth: Payer: Self-pay

## 2022-10-11 NOTE — Telephone Encounter (Signed)
Phone call to patient with interpreter [name] assisting.  Patient states she is feeling much better today.  She took 1/2 bottle of Magnesium citrate yesterday and was able to have BM.  Will continue Miralax and MOM. Also reiterated instructions for eye drops.  Jake Michaelis RN, Congregational Nurse [phone]

## 2022-10-17 ENCOUNTER — Other Ambulatory Visit: Payer: Self-pay

## 2022-10-21 ENCOUNTER — Other Ambulatory Visit: Payer: Self-pay

## 2022-10-25 ENCOUNTER — Telehealth: Payer: Self-pay | Admitting: Hematology

## 2022-10-25 ENCOUNTER — Telehealth: Payer: Self-pay

## 2022-10-25 NOTE — Telephone Encounter (Signed)
Phone call from patient with interpreter [name] assisting.  Patient states she is out of tramadol which she takes as needed for leg pain.  Advised patient she has 15 tablets left on original prescription and CN will pick it up for her ASAP.  She continues to take miralax and MOM daily.  Stated she had small amount bright blood on toilet tissue after hard BM 3 days ago.  Today experienced anal pain and black watery stool.  CN will notify Dr. Mosetta Putt.  [name] RN, Congregational Nurse [phone]

## 2022-10-25 NOTE — Telephone Encounter (Signed)
Contacted patient to scheduled appointments. Left message with appointment details and a call back number if patient had any questions or could not accommodate the time we provided.   

## 2022-10-26 ENCOUNTER — Other Ambulatory Visit (HOSPITAL_COMMUNITY): Payer: Self-pay

## 2022-10-26 IMAGING — DX DG ABDOMEN 2V
2 series · 2 of 2 positions shown · non-contrast
Comparison: None Available.

CLINICAL DATA: History of colorectal cancer, abdominal pain and
constipation

EXAM:
ABDOMEN - 2 VIEW

[t abdomen supine (1 of 2)]
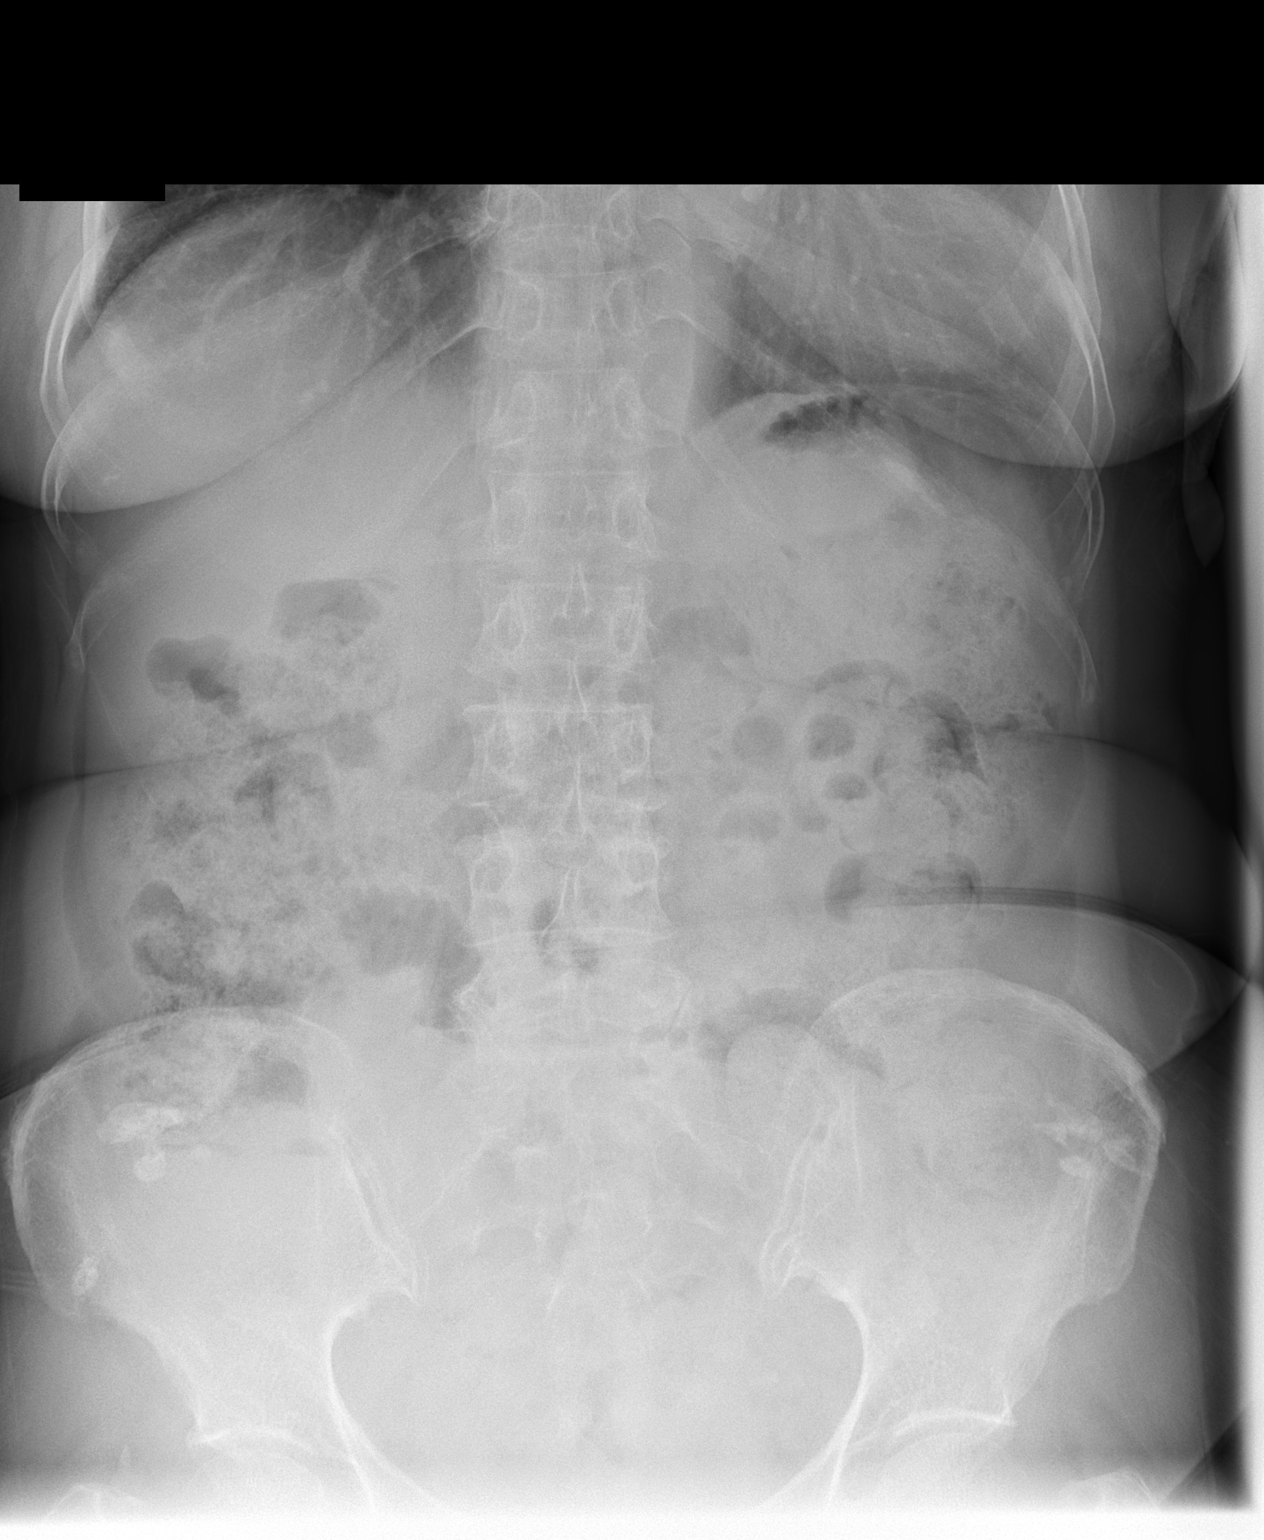

[t abdomen supine (2 of 2)]
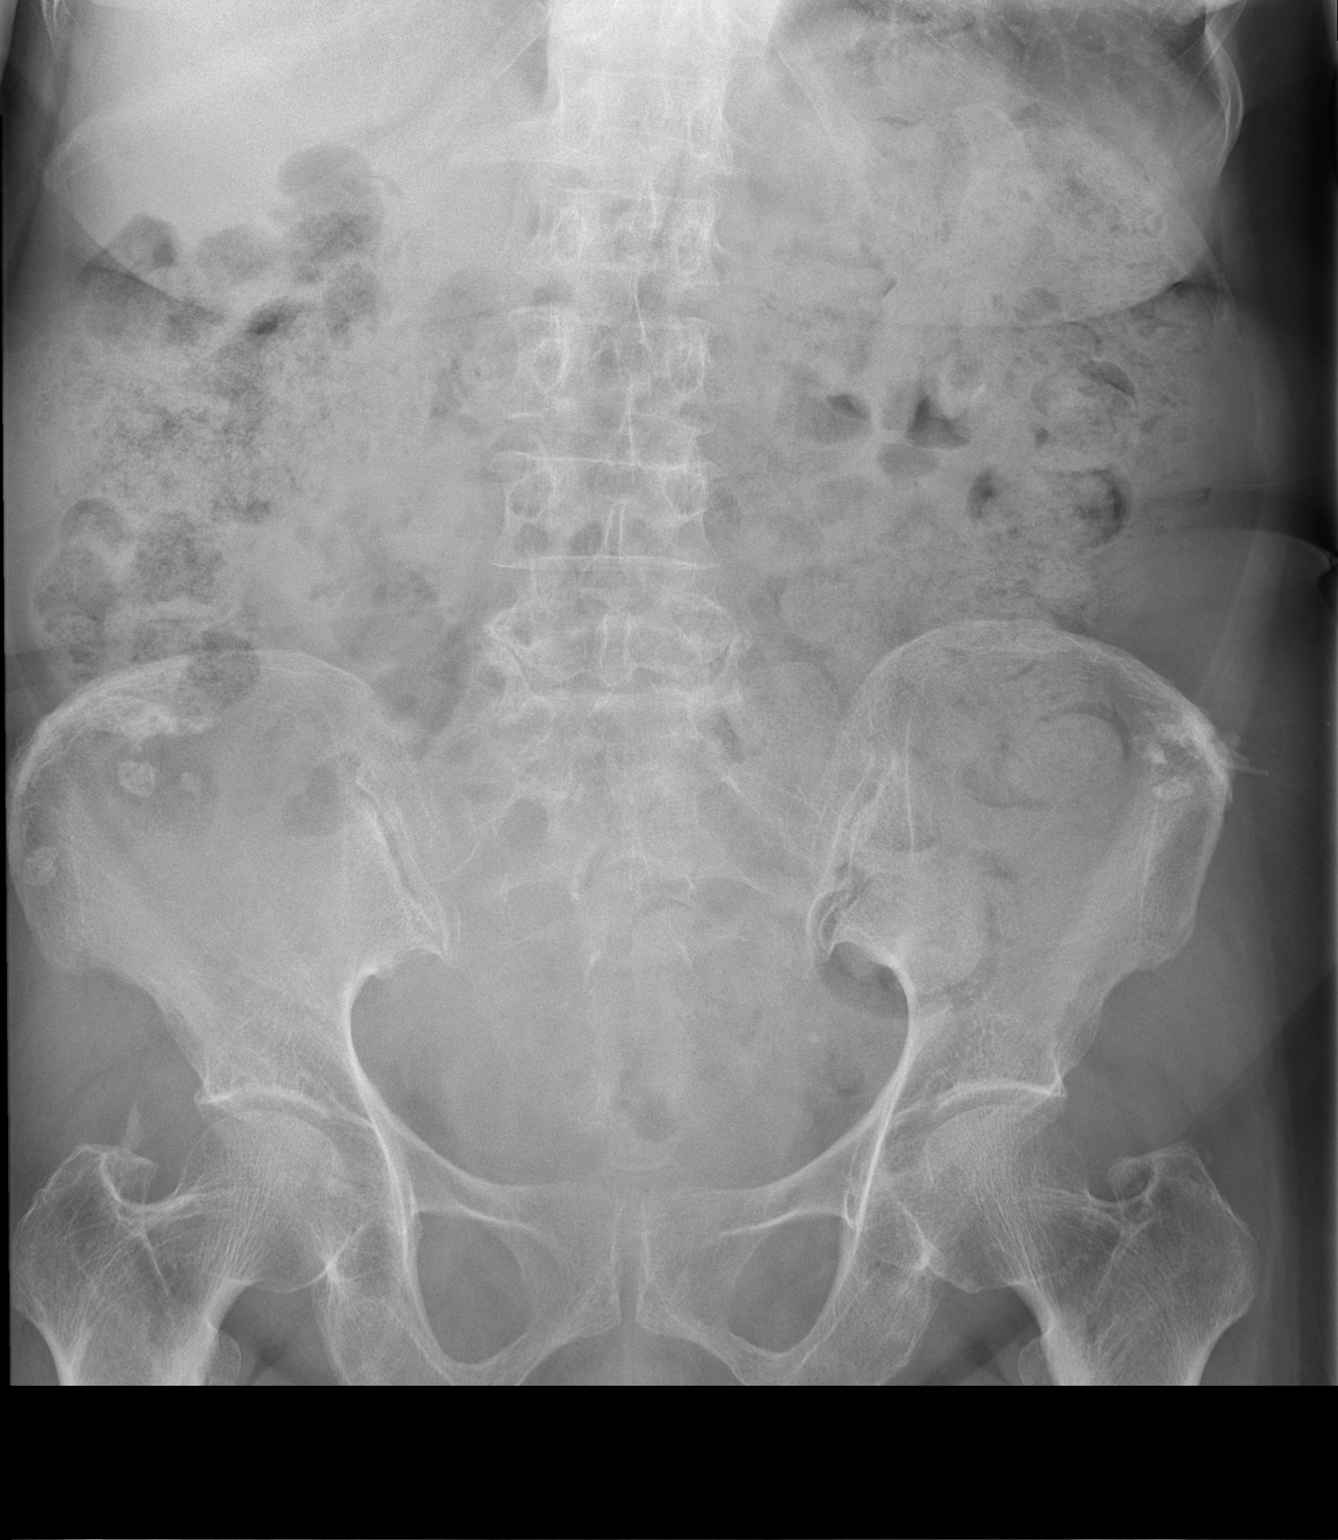

[2 of 2 positions shown; findings below may reference images not displayed]

FINDINGS: Bowel-gas pattern is within normal limits. No air-fluid levels. No
free air. Moderate stool burden. Acute osseous abnormality.
IMPRESSION: Nonobstructive bowel gas pattern.

## 2022-10-26 NOTE — Congregational Nurse Program (Signed)
Home visit to deliver tramadol which patient stated she needed for her leg pain.  She takes no more than 1 tablet per day when pain is severe. States she has not had any more black stools and does not want to schedule MD appointment at present.  She will call CN or interpreter [name] if she has further problems which she would like addressed bu Dr. Mosetta Putt.  [name] RN, Congregational Nurse [phone]

## 2022-10-30 ENCOUNTER — Inpatient Hospital Stay: Payer: Medicaid Other | Admitting: Hematology

## 2022-11-02 ENCOUNTER — Other Ambulatory Visit: Payer: Self-pay | Admitting: Hematology

## 2022-11-02 ENCOUNTER — Other Ambulatory Visit: Payer: Self-pay

## 2022-11-02 ENCOUNTER — Other Ambulatory Visit: Payer: Self-pay | Admitting: Student

## 2022-11-02 ENCOUNTER — Other Ambulatory Visit (HOSPITAL_COMMUNITY): Payer: Self-pay

## 2022-11-02 DIAGNOSIS — I10 Essential (primary) hypertension: Secondary | ICD-10-CM

## 2022-11-02 MED ORDER — AMLODIPINE BESYLATE 5 MG PO TABS
5.0000 mg | ORAL_TABLET | Freq: Every day | ORAL | 3 refills | Status: DC
  Filled 2022-11-02: qty 90, 90d supply, fill #0
  Filled 2023-02-27 (×2): qty 90, 90d supply, fill #1
  Filled 2023-04-24 – 2023-07-23 (×3): qty 90, 90d supply, fill #2
  Filled 2023-10-22: qty 90, 90d supply, fill #3

## 2022-11-02 MED ORDER — RIVAROXABAN 20 MG PO TABS
20.0000 mg | ORAL_TABLET | Freq: Every day | ORAL | 2 refills | Status: DC
  Filled 2022-11-02: qty 30, 30d supply, fill #0
  Filled 2022-12-07: qty 30, 30d supply, fill #1
  Filled 2023-01-15: qty 30, 30d supply, fill #2

## 2022-11-03 ENCOUNTER — Other Ambulatory Visit (HOSPITAL_COMMUNITY): Payer: Self-pay

## 2022-11-03 ENCOUNTER — Other Ambulatory Visit: Payer: Self-pay

## 2022-11-03 ENCOUNTER — Telehealth: Payer: Self-pay

## 2022-11-03 NOTE — Telephone Encounter (Signed)
Phone call to patient with interpreter [name] assisting.  Advised patient that medication refills will be ready on Monday and CN will pick them up and deliver to patient.  Reminded her of cataract surgery next Friday 11/10/2022. We will home visit on 11/08/2022 to review instructions.  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  [name] RN, Congregational Nurse [phone]

## 2022-11-06 ENCOUNTER — Other Ambulatory Visit (HOSPITAL_COMMUNITY): Payer: Self-pay

## 2022-11-07 NOTE — Congregational Nurse Program (Signed)
Home visit to deliver the following medications which were picked up from [facility]:  gabapentin, mirtazapine, lisinopril-hctz, amlodipine and xarelto.  Reviewed purpose and directions for each and patient wrote on labels in her language.  Patient had questions regarding upcoming cataract surgery.  CN will revisit tomorrow with interpreter to go over pre-op instructions.  [name] RN, Congregational Nurse [phone]

## 2022-11-08 DIAGNOSIS — H2512 Age-related nuclear cataract, left eye: Secondary | ICD-10-CM | POA: Diagnosis not present

## 2022-11-08 NOTE — Congregational Nurse Program (Signed)
Home visit with interpreter [name].  Reviewed pre-op instructions for left cataract surgery scheduled for 11/10/2022.  She is very anxious about procedure because she cannot see out of right eye and is afraid she might loose vision in left eye.  CN will accompany patient to procedure.  [name] RN, Congregational Nurse [phone]

## 2022-11-10 ENCOUNTER — Other Ambulatory Visit: Payer: Self-pay | Admitting: Hematology

## 2022-11-10 ENCOUNTER — Other Ambulatory Visit (HOSPITAL_COMMUNITY): Payer: Self-pay

## 2022-11-10 DIAGNOSIS — H25812 Combined forms of age-related cataract, left eye: Secondary | ICD-10-CM | POA: Diagnosis not present

## 2022-11-10 MED ORDER — TRAMADOL HCL 50 MG PO TABS
50.0000 mg | ORAL_TABLET | Freq: Three times a day (TID) | ORAL | 0 refills | Status: DC | PRN
  Filled 2022-11-10: qty 15, 5d supply, fill #0
  Filled 2022-11-23: qty 45, 15d supply, fill #0

## 2022-11-10 NOTE — Congregational Nurse Program (Signed)
Accompanied patient to the [facility] for left cataract surgery with Dr. Fabian Sharp and to follow-up appointment after surgery.  Procedure went well and patient able to see with clear shield in place over eye.  States she is having no pain.  Glaucoma pressures normal . She expresses understanding of instructions for post -op eye drops and that she is to continue glaucoma drops as usual.  CN picked up prescription for Tramadol prescribed by Dr. Mosetta Putt for severe leg pain and delivered it to patient.  Reminded her of Nov 15, 2022 appointment with Dr. Dione Booze.  [name] RN, Congregational Nurse [phone]

## 2022-11-19 NOTE — Assessment & Plan Note (Signed)
-  Doppler on July 13, 2022 showed left acute DVT involving popliteal and peroneal vein, CTA chest was negative for PE. -She started Xarelto, tolerating well. 

## 2022-11-19 NOTE — Assessment & Plan Note (Signed)
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 -Despite strong recommendation, patient firmly and repeatedly declined IV chemo and surgery. She agreed to chemoRT with Xeloda, and received the treatment 01/04/21 - 02/14/21. -she developed local cancer progression and lung mets in 11/2021 -she started Xeloda on 02/16/2022, low dose oxaliplatin was added on 05/11/22 (with cycle 4 of Xeloda)  -chemo held since 07/2022 due to leg pain and recurrent nausea  -restaging CT 09/07/2022 showed stable disease in rectum and lung, no new lesions.  I personally reviewed her CT scan images and compared to the previous scans, her lung lesion is minimal now she has had excellent response so far. -Due to her significant fatigue, limited performance status, partially related to chemotherapy and recent sacrum fracture, she started chemo break in late feb 2024

## 2022-11-19 NOTE — Assessment & Plan Note (Signed)
-  started in Dec 2023 -MRI 08/15/22 showed new extensive abnormal signal in the sacral ala bilaterally which is most likely secondary to bilateral insufficiency fractures -pain has overall improved  

## 2022-11-20 ENCOUNTER — Inpatient Hospital Stay: Payer: Medicaid Other | Attending: Physician Assistant

## 2022-11-20 ENCOUNTER — Inpatient Hospital Stay (HOSPITAL_BASED_OUTPATIENT_CLINIC_OR_DEPARTMENT_OTHER): Payer: Medicaid Other | Admitting: Hematology

## 2022-11-20 ENCOUNTER — Encounter: Payer: Self-pay | Admitting: Hematology

## 2022-11-20 ENCOUNTER — Telehealth: Payer: Self-pay

## 2022-11-20 VITALS — BP 119/82 | HR 75 | Temp 98.4°F | Resp 16 | Ht 59.0 in | Wt 112.1 lb

## 2022-11-20 DIAGNOSIS — C20 Malignant neoplasm of rectum: Secondary | ICD-10-CM

## 2022-11-20 DIAGNOSIS — Z86718 Personal history of other venous thrombosis and embolism: Secondary | ICD-10-CM | POA: Insufficient documentation

## 2022-11-20 DIAGNOSIS — K5900 Constipation, unspecified: Secondary | ICD-10-CM | POA: Insufficient documentation

## 2022-11-20 DIAGNOSIS — Z7901 Long term (current) use of anticoagulants: Secondary | ICD-10-CM | POA: Diagnosis not present

## 2022-11-20 DIAGNOSIS — C7800 Secondary malignant neoplasm of unspecified lung: Secondary | ICD-10-CM | POA: Diagnosis not present

## 2022-11-20 DIAGNOSIS — I82432 Acute embolism and thrombosis of left popliteal vein: Secondary | ICD-10-CM | POA: Diagnosis not present

## 2022-11-20 DIAGNOSIS — R5383 Other fatigue: Secondary | ICD-10-CM | POA: Insufficient documentation

## 2022-11-20 DIAGNOSIS — M79604 Pain in right leg: Secondary | ICD-10-CM | POA: Diagnosis not present

## 2022-11-20 DIAGNOSIS — M79605 Pain in left leg: Secondary | ICD-10-CM

## 2022-11-20 LAB — CBC WITH DIFFERENTIAL (CANCER CENTER ONLY)
Abs Immature Granulocytes: 0.02 10*3/uL (ref 0.00–0.07)
Basophils Absolute: 0 10*3/uL (ref 0.0–0.1)
Basophils Relative: 1 %
Eosinophils Absolute: 0.1 10*3/uL (ref 0.0–0.5)
Eosinophils Relative: 2 %
HCT: 33.9 % — ABNORMAL LOW (ref 36.0–46.0)
Hemoglobin: 11 g/dL — ABNORMAL LOW (ref 12.0–15.0)
Immature Granulocytes: 0 %
Lymphocytes Relative: 9 %
Lymphs Abs: 0.6 10*3/uL — ABNORMAL LOW (ref 0.7–4.0)
MCH: 24.7 pg — ABNORMAL LOW (ref 26.0–34.0)
MCHC: 32.4 g/dL (ref 30.0–36.0)
MCV: 76 fL — ABNORMAL LOW (ref 80.0–100.0)
Monocytes Absolute: 0.6 10*3/uL (ref 0.1–1.0)
Monocytes Relative: 9 %
Neutro Abs: 5.5 10*3/uL (ref 1.7–7.7)
Neutrophils Relative %: 79 %
Platelet Count: 309 10*3/uL (ref 150–400)
RBC: 4.46 MIL/uL (ref 3.87–5.11)
RDW: 15.8 % — ABNORMAL HIGH (ref 11.5–15.5)
WBC Count: 6.9 10*3/uL (ref 4.0–10.5)
nRBC: 0 % (ref 0.0–0.2)

## 2022-11-20 LAB — CMP (CANCER CENTER ONLY)
ALT: <5 U/L (ref 0–44)
AST: 14 U/L — ABNORMAL LOW (ref 15–41)
Albumin: 3.8 g/dL (ref 3.5–5.0)
Alkaline Phosphatase: 82 U/L (ref 38–126)
Anion gap: 6 (ref 5–15)
BUN: 14 mg/dL (ref 8–23)
CO2: 28 mmol/L (ref 22–32)
Calcium: 8.9 mg/dL (ref 8.9–10.3)
Chloride: 103 mmol/L (ref 98–111)
Creatinine: 0.79 mg/dL (ref 0.44–1.00)
GFR, Estimated: >60 mL/min (ref >60)
Glucose, Bld: 94 mg/dL (ref 70–99)
Potassium: 3.9 mmol/L (ref 3.5–5.1)
Sodium: 137 mmol/L (ref 135–145)
Total Bilirubin: 0.4 mg/dL (ref 0.3–1.2)
Total Protein: 7.4 g/dL (ref 6.5–8.1)

## 2022-11-20 LAB — TOTAL PROTEIN, URINE DIPSTICK: Protein, ur: NEGATIVE mg/dL

## 2022-11-20 LAB — CEA (ACCESS): CEA (CHCC): 63.05 ng/mL — ABNORMAL HIGH (ref 0.00–5.00)

## 2022-11-20 NOTE — Progress Notes (Signed)
[facility]   [phone] [phone]   Clinic Follow up Note   Patient Care Team: Doran Stabler, DO as PCP - General (Internal Medicine) [name], RN (Inactive) as Oncology Nurse Navigator [name], NP as Nurse Practitioner (Nurse Practitioner) Malachy Mood, MD as Consulting Physician (Hematology and Oncology) Dorothy Puffer, MD as Consulting Physician (Radiation Oncology) [name], Netty Starring., MD as Consulting Physician (Gastroenterology) Josephine Igo, DO as Consulting Physician (Pulmonary Disease) Karie Soda, MD as Consulting Physician (General Surgery)  Date of Service:  11/20/2022  CHIEF COMPLAINT: f/u of  colon and rectal cancer      CURRENT THERAPY:    ASSESSMENT:  Jillian Lutz is a 72 y.o. female with   Rectal adenocarcinoma (HCC) cT3cN1M0 stage IIIb, biopsy confirmed residual primary tumor and oligo lung met in 09/2021 -Initially diagnosed 11/2020 by colonoscopy for progressive rectal pain and bleeding, weight loss, and constipation. -Despite strong recommendation, patient firmly and repeatedly declined IV chemo and surgery. She agreed to chemoRT with Xeloda, and received the treatment 01/04/21 - 02/14/21. -she developed local cancer progression and lung mets in 11/2021 -she started Xeloda on 02/16/2022, low dose oxaliplatin was added on 05/11/22 (with cycle 4 of Xeloda)  -chemo held since 07/2022 due to leg pain and recurrent nausea  -restaging CT 09/07/2022 showed stable disease in rectum and lung, no new lesions.  I personally reviewed her CT scan images and compared to the previous scans, her lung lesion is minimal now she has had excellent response so far. -Due to her significant fatigue, limited performance status, partially related to chemotherapy and recent sacrum fracture, she started chemo break in late feb 2024 -She reports persistent fatigue, low appetite, constipation and intermittent abdominal cramps.  This is  concerning for cancer progression.  I will repeat a CT chest, abdomen pelvis in the next 2 weeks. -I discussed restarting chemotherapy, and the options.  Patient is reluctant to consider chemo, but agreed to have CT scan and discuss further after CT.  Left leg DVT (HCC) -Doppler on July 13, 2022 showed left acute DVT involving popliteal and peroneal vein, CTA chest was negative for PE. -She started Xarelto, tolerating well    Leg pain -started in Dec 2023 -MRI 08/15/22 showed new extensive abnormal signal in the sacral ala bilaterally which is most likely secondary to bilateral insufficiency fractures -pain has near resolved now.  She takes tramadol as needed.    PLAN: -lab reviewed -order CT CAP  scan in 2 weeks -Will check if she needs preauthorization for tramadol, and submit it, so she can have a month of supply instead of 5 days. -f/u in 2-3 weeks after restaging CT scan.   SUMMARY OF ONCOLOGIC HISTORY: Oncology History Overview Note  Cancer Staging Rectal adenocarcinoma [facility]) Staging form: Colon and Rectum, AJCC 8th Edition - Clinical stage from 12/21/2020: Stage IIIB (cT3, cN1, cM0) - Unsigned    Rectal adenocarcinoma (HCC)  08/26/2020 Miscellaneous   Initial presentation to PCP, reporting intermittent BRBPR since 02/2020    12/01/2020 Procedure   Colonoscopy by Dr. Lavon Paganini findings - The perianal and digital rectal examinations were normal. - A 15 mm polyp was found in the ascending colon. The polyp was semi-pedunculated. Resection and retrieval were complete. - A 25 mm polyp was found in the sigmoid colon. The polyp was pedunculated. Resection and retrieval were complete. - An infiltrative partially obstructing large mass was found in the proximal rectum. The mass was partially circumferential (involving one-half  of the lumen circumference). The mass measured eight cm in length extending from 10 to 18cm from anal verge. This was biopsied with a cold forceps for  histology. Proximal and distal opposite fold area of the mass lesion was tattooed with an injection of total 3 mL of Spot (carbon black).   12/01/2020 Initial Biopsy   Diagnosis 1. Colon, polyp(s), ascending x 1 - ADENOCARCINOMA ARISING IN A TUBULAR ADENOMA WITH HIGH-GRADE DYSPLASIA. SEE NOTE 2. Colon, sigmoid polyp, x1 - TUBULOVILLOUS ADENOMA(S) - NEGATIVE FOR HIGH-GRADE DYSPLASIA OR MALIGNANCY 3. Rectum, biopsy - ADENOCARCINOMA. SEE NOTE   12/01/2020 Cancer Staging   Cancer Staging Rectal adenocarcinoma [facility]) Staging form: Colon and Rectum, AJCC 8th Edition - Clinical stage from 12/21/2020: Stage IIIB (cT3, cN1, cM0) - Unsigned    12/06/2020 Imaging   CT CAP with contrast IMPRESSION: 1. There is partially circumferential soft tissue thickening of the mid to superior rectum, approximately 5 cm in length and the inferior extent approximately 6 cm above the anal verge, consistent with primary rectal malignancy identified by colonoscopy. 2. There appear to be abnormally enlarged perirectal lymph nodes posteriorly about the superior rectum measuring up to 1.0 x 0.8 cm. Findings are suspicious for perirectal nodal metastatic disease, however rectal MRI is the test of choice for initial local staging of rectal cancer. 3. There is a 4 mm nonspecific pulmonary nodule of the superior segment left lower lobe, statistically most likely incidental, infectious or inflammatory, although nonspecific and isolated metastatic disease is not strictly excluded. Attention on follow-up. 4. No other evidence of metastatic disease in the chest, abdomen, or pelvis. Aortic Atherosclerosis (ICD10-I70.0).   12/09/2020 Imaging   Local staging MRI pelvis without contrast IMPRESSION: 4.9 cm circumferential mid/lower rectal mass, corresponding to the patient's newly diagnosed rectal cancer. Rectal adenocarcinoma T stage: T3c Rectal adenocarcinoma N stage:  N1 Distance from tumor to the internal anal sphincter  is 3.7 cm.   12/21/2020 Initial Diagnosis   Rectal adenocarcinoma (HCC)   01/04/2021 -  Chemotherapy   Concurrent chemoradiation with Xeloda 1000mg  in the AM and 1500mg  in the PM on days of Radiation starting 01/04/21.       01/04/2021 - 02/11/2021 Radiation Therapy   Concurrent chemoradiation with Dr Mitzi Hansen and Xeloda starting 01/04/21.    01/31/2022 Procedure   Colonoscopy, Dr. Lavon Paganini  Findings: - An infiltrative partially obstructing large mass was found in the rectum. The mass was circumferential. The mass measured ten cm in length, extending from 5-15cm from anal verge. In addition, rectal diameter at narrowest approx twelve mm. Oozing was present. Biopsies were taken with a cold forceps for histology.  Impression: - Hemorrhoids found on perianal exam. - Likely malignant partially obstructing tumor in the rectum. Biopsied. - Non-bleeding external and internal hemorrhoids.   01/31/2022 Pathology Results   Diagnosis Rectum, biopsy INVASIVE MODERATELY DIFFERENTIATED ADENOCARCINOMA   05/11/2022 -  Chemotherapy   Patient is on Treatment Plan : COLORECTAL Xelox (Capeox)(130/850) q21d     09/07/2022 Imaging    IMPRESSION: 1. No significant change in circumferential wall thickening of the rectum, in keeping with known primary rectal mass. Similar appearance of post treatment perirectal and presacral fat stranding and fascial thickening 2. Unchanged nodule of the superior segment left lower lobe with adjacent bandlike scarring. No new nodules. 3. No evidence of lymphadenopathy or other metastatic disease in the chest, abdomen, or pelvis. 4. Large burden of stool throughout the colon. 5. Cardiomegaly.      INTERVAL HISTORY:  Jillian Lutz is  here for a follow up of  colon and rectal cancer . She was last seen by me on 09/14/2022. She presents to the clinic accompanied by interpreter and sponsor.Pt report that she had left eye surgery and it well on 4/26.Pt state that chemo makes her  not want to eat or taste better. Pt state that she is having abdominal  hungry pain cause she is not eating well.Pt state that she has a burning sensation in her.Pt state that she drinks 2 bottles a day of Ensure.Pt state that her BM is ok when she takes MiraLax, but the taste isn't good.       All other systems were reviewed with the patient and are negative.  MEDICAL HISTORY:  Past Medical History:  Diagnosis Date   Bilateral wrist pain 10/25/2017   Hypertension    Neck mass 1998   Unknown biopsy results. Excised in [place].   Pre-diabetes    Rectal cancer (HCC)    Trigger finger, acquired 02/08/2017    SURGICAL HISTORY: Past Surgical History:  Procedure Laterality Date   BRONCHIAL BIOPSY  12/13/2021   Procedure: BRONCHIAL BIOPSIES;  Surgeon: Josephine Igo, DO;  Location: [facility];  Service: Pulmonary;;   BRONCHIAL NEEDLE ASPIRATION BIOPSY  12/13/2021   Procedure: BRONCHIAL NEEDLE ASPIRATION BIOPSIES;  Surgeon: Josephine Igo, DO;  Location: [facility]OS[facility];  Service: Pulmonary;;   NECK SURGERY Left 1998   Excision of mass in [place]   VIDEO BRONCHOSCOPY WITH RADIAL ENDOBRONCHIAL ULTRASOUND  12/13/2021   Procedure: RADIAL ENDOBRONCHIAL ULTRASOUND;  Surgeon: Josephine Igo, DO;  Location: [facility] ENDOS[facility];  Service: Pulmonary;;    I have reviewed the social history and family history with the patient and they are unchanged from previous note.  ALLERGIES:  is allergic to no known allergies.  MEDICATIONS:  Current Outpatient Medications  Medication Sig Dispense Refill   amLODipine (NORVASC) 5 MG tablet Take 1 tablet (5 mg total) by mouth daily. 90 tablet 3   brimonidine (ALPHAGAN) 0.2 % ophthalmic solution Place 1 drop into the right eye 2 (two) times daily. 15 mL 3   brimonidine (ALPHAGAN) 0.2 % ophthalmic solution Place 1 drop into the right eye 2 (two) times daily. 10 mL 11   capecitabine (XELODA) 500 MG tablet Take 2 tablets (1000 mg) by mouth in morning and 3 tablets  (1500 mg) by mouth in evening. Take within 30 minutes after meals. Take for 14 days on, 7 days off. Repeat every 21 days. 70 tablet 0   dorzolamide-timolol (COSOPT) 2-0.5 % ophthalmic solution Place 1 drop into both eyes 2 times daily. 30 mL 3   dorzolamide-timolol (COSOPT) 2-0.5 % ophthalmic solution Instill 1 drop into both eyes twice a day 10 mL 11   gabapentin (NEURONTIN) 100 MG capsule Take 1 capsule (100 mg total) by mouth 3 (three) times daily as needed. 90 capsule 2   latanoprost (XALATAN) 0.005 % ophthalmic solution Instill 1 drop into both eyes every night at bedtime 7.5 mL 3   latanoprost (XALATAN) 0.005 % ophthalmic solution Place 1 drop into both eyes at bedtime. 7.5 mL 6   lisinopril-hydrochlorothiazide (ZESTORETIC) 20-12.5 MG tablet Take 2 tablets by mouth daily. 60 tablet 3   mirtazapine (REMERON) 15 MG tablet Take 1 tablet (15 mg total) by mouth at bedtime. 30 tablet 1   Multiple Vitamins-Minerals (MULTIVITAMIN WITH MINERALS) tablet Take 1 tablet by mouth daily. Gummy 50 mg     polyethylene glycol powder (GLYCOLAX/MIRALAX) 17 GM/SCOOP powder Take 1 Container  by mouth once.     prochlorperazine (COMPAZINE) 10 MG tablet Take 1 tablet (10 mg total) by mouth every 6 (six) hours as needed. 30 tablet 2   rivaroxaban (XARELTO) 20 MG TABS tablet Take 1 tablet (20 mg total) by mouth daily with supper. 30 tablet 2   traMADol (ULTRAM) 50 MG tablet Take 1 tablet (50 mg total) by mouth every 8 (eight) hours as needed. 60 tablet 0   urea (CARMOL) 10 % cream Apply topically 2 times daily as needed. 85 g 3   No current facility-administered medications for this visit.    PHYSICAL EXAMINATION: ECOG PERFORMANCE STATUS: 2 - Symptomatic, <50% confined to bed  Vitals:   11/20/22 1124  BP: 119/82  Pulse: 75  Resp: 16  Temp: 98.4 F (36.9 C)  SpO2: 99%   Wt Readings from Last 3 Encounters:  11/20/22 112 lb 1.6 oz (50.8 kg)  09/14/22 115 lb 14.4 oz (52.6 kg)  09/06/22 114 lb (51.7 kg)      GENERAL:alert, no distress and comfortable SKIN: skin color normal, no rashes or significant lesions EYES: normal, Conjunctiva are pink and non-injected, sclera clear  NEURO: alert & oriented x 3 with fluent speech LABORATORY DATA:  I have reviewed the data as listed    Latest Ref Rng & Units 11/20/2022   11:11 AM 08/24/2022    9:28 AM 08/03/2022    8:54 AM  CBC  WBC 4.0 - 10.5 K/uL 6.9  4.6  3.1   Hemoglobin 12.0 - 15.0 g/dL 40.9  81.1  91.4   Hematocrit 36.0 - 46.0 % 33.9  37.3  35.4   Platelets 150 - 400 K/uL 309  228  182         Latest Ref Rng & Units 11/20/2022   11:11 AM 08/24/2022    9:28 AM 08/03/2022    8:54 AM  CMP  Glucose 70 - 99 mg/dL 94  782  956   BUN 8 - 23 mg/dL 14  8  7    Creatinine 0.44 - 1.00 mg/dL 2.13  0.86  5.78   Sodium 135 - 145 mmol/L 137  139  141   Potassium 3.5 - 5.1 mmol/L 3.9  3.4  3.6   Chloride 98 - 111 mmol/L 103  106  105   CO2 22 - 32 mmol/L 28  27  30    Calcium 8.9 - 10.3 mg/dL 8.9  9.1  9.2   Total Protein 6.5 - 8.1 g/dL 7.4  6.9  6.9   Total Bilirubin 0.3 - 1.2 mg/dL 0.4  0.5  0.4   Alkaline Phos 38 - 126 U/L 82  86  83   AST 15 - 41 U/L 14  22  24    ALT 0 - 44 U/L <5  <5  6       RADIOGRAPHIC STUDIES: I have personally reviewed the radiological images as listed and agreed with the findings in the report. No results found.    Orders Placed This Encounter  Procedures   CT CHEST ABDOMEN PELVIS W CONTRAST    Standing Status:   Future    Standing Expiration Date:   11/20/2023    Order Specific Question:   If indicated for the ordered procedure, I authorize the administration of contrast media per Radiology protocol    Answer:   Yes    Order Specific Question:   Does the patient have a contrast media/X-ray dye allergy?    Answer:   No  Order Specific Question:   Preferred imaging location?    Answer:   [facility]    Order Specific Question:   Release to patient    Answer:   Immediate    Order Specific Question:   If  indicated for the ordered procedure, I authorize the administration of oral contrast media per Radiology protocol    Answer:   Yes   All questions were answered. The patient knows to call the clinic with any problems, questions or concerns. No barriers to learning was detected. The total time spent in the appointment was 30 minutes.     Malachy Mood, MD 11/20/2022   Carolin Coy, CMA, am acting as scribe for Malachy Mood, MD.   I have reviewed the above documentation for accuracy and completeness, and I agree with the above.

## 2022-11-20 NOTE — Telephone Encounter (Signed)
Notified [name] (Nurse) of prior authorization approval for Tramadol 50 mg tablets. Medication is approved through 05/23/2023. No other needs or concerns noted at this time.

## 2022-11-21 ENCOUNTER — Other Ambulatory Visit: Payer: Self-pay

## 2022-11-21 ENCOUNTER — Other Ambulatory Visit (HOSPITAL_COMMUNITY): Payer: Self-pay

## 2022-11-21 NOTE — Congregational Nurse Program (Signed)
Accompanied patient to visit with Dr. Mosetta Putt.  Patient c/o loss of appetite, frequent mild abdominal pain and leg pain.  States Tramadol helps but she is only taking 1 tablet Q.O.D because she doesn't want to become addicted or run out.  Medicaid will only allow a 5 day supply.  Dr. Mosetta Putt will send an authorization to allow her to get the full prescription.  Patient states she doesn't like taking the Milk of magnesia and miralax because it nauseates her.  Dr. Mosetta Putt recommended dulcolax or senokot tablets.  C-T scan ordered and scheduled for 12/08/22 (arrive @ 12:15) and follow-up with Dr. Mosetta Putt on 12/12/2022 @ 11:00 am.  CN picked up dulcolax from [facility] and took to patient and will schedule transportation for appointments. [name] RN, Congregational Nurse [phone]

## 2022-11-22 DIAGNOSIS — Z111 Encounter for screening for respiratory tuberculosis: Secondary | ICD-10-CM | POA: Diagnosis not present

## 2022-11-22 NOTE — Congregational Nurse Program (Signed)
CN and interpreter [name] accompanied patient to appointment at [facility] for TB skin test reading which was negative.  Phone call to inform Mr. Alleen Borne at [facility] agency.  He stated he will stop by her house on Friday to pick up copy of results.  [name] RN, Congregational Nurse [phone]

## 2022-11-23 ENCOUNTER — Other Ambulatory Visit (HOSPITAL_COMMUNITY): Payer: Self-pay

## 2022-11-24 NOTE — Congregational Nurse Program (Signed)
Home visit to deliver tramadol prescription which was picked up from [facility].  [name] RN, Congregational Nurse [phone]

## 2022-12-04 ENCOUNTER — Telehealth: Payer: Self-pay

## 2022-12-04 ENCOUNTER — Encounter: Payer: Medicaid Other | Admitting: Student

## 2022-12-04 NOTE — Telephone Encounter (Signed)
Patient called to state that her ride has not come for 10:15 appointment at [facility].  CN called [employer] transportation [facility]) who stated driver would arrive in 2 minutes.  Patient advised of this but she called back 15 minutes later to state no one came or called.  CN  called [facility] and rescheduled appointment for 12/07/22 @ 10;15 am.  Transportation also requested for new appointment.  [name] RN, Congregational Nurse [phone]

## 2022-12-07 ENCOUNTER — Other Ambulatory Visit: Payer: Self-pay

## 2022-12-07 ENCOUNTER — Encounter: Payer: Self-pay | Admitting: Student

## 2022-12-07 ENCOUNTER — Other Ambulatory Visit: Payer: Self-pay | Admitting: Student

## 2022-12-07 ENCOUNTER — Encounter: Payer: Self-pay | Admitting: Hematology

## 2022-12-07 ENCOUNTER — Ambulatory Visit: Payer: Medicaid Other | Admitting: Student

## 2022-12-07 ENCOUNTER — Telehealth: Payer: Self-pay

## 2022-12-07 ENCOUNTER — Other Ambulatory Visit (HOSPITAL_COMMUNITY): Payer: Self-pay

## 2022-12-07 VITALS — BP 109/72 | HR 66 | Temp 97.8°F

## 2022-12-07 DIAGNOSIS — R202 Paresthesia of skin: Secondary | ICD-10-CM

## 2022-12-07 DIAGNOSIS — C20 Malignant neoplasm of rectum: Secondary | ICD-10-CM

## 2022-12-07 DIAGNOSIS — K6289 Other specified diseases of anus and rectum: Secondary | ICD-10-CM | POA: Diagnosis not present

## 2022-12-07 DIAGNOSIS — R634 Abnormal weight loss: Secondary | ICD-10-CM | POA: Diagnosis not present

## 2022-12-07 DIAGNOSIS — R630 Anorexia: Secondary | ICD-10-CM | POA: Diagnosis not present

## 2022-12-07 DIAGNOSIS — I10 Essential (primary) hypertension: Secondary | ICD-10-CM

## 2022-12-07 MED ORDER — MIRTAZAPINE 30 MG PO TABS
30.0000 mg | ORAL_TABLET | Freq: Every day | ORAL | 1 refills | Status: DC
Start: 2022-12-07 — End: 2023-01-08
  Filled 2022-12-07: qty 30, 30d supply, fill #0

## 2022-12-07 MED ORDER — GABAPENTIN 100 MG PO CAPS
100.0000 mg | ORAL_CAPSULE | Freq: Three times a day (TID) | ORAL | 2 refills | Status: DC | PRN
Start: 2022-12-07 — End: 2023-04-24
  Filled 2022-12-07: qty 90, 30d supply, fill #0
  Filled 2023-01-15: qty 90, 30d supply, fill #1
  Filled 2023-02-27 (×2): qty 90, 30d supply, fill #2

## 2022-12-07 MED ORDER — LIDOCAINE 5 % EX OINT
TOPICAL_OINTMENT | CUTANEOUS | 0 refills | Status: DC
Start: 2022-12-07 — End: 2023-01-09
  Filled 2022-12-07: qty 35.44, 10d supply, fill #0

## 2022-12-07 MED ORDER — LISINOPRIL-HYDROCHLOROTHIAZIDE 20-12.5 MG PO TABS
2.0000 | ORAL_TABLET | Freq: Every day | ORAL | 3 refills | Status: DC
Start: 2022-12-07 — End: 2023-07-23
  Filled 2022-12-07: qty 60, 30d supply, fill #0
  Filled 2023-01-15: qty 60, 30d supply, fill #1
  Filled 2023-02-27 (×2): qty 60, 30d supply, fill #2
  Filled 2023-04-24 (×2): qty 60, 30d supply, fill #3

## 2022-12-07 NOTE — Patient Instructions (Addendum)
We will increase your mirtazapine to 30mg  daily, you will take two tablets of this daily.   For your anal pain, in addition to your tramadol you can use a medication like lidocaine and place this cream in the area that burns.   Because you are on tramadol and mirtazpine, please let our clinic know if you have confusion or problems with your muscles.   We will also refer you to palliative care, as they have other options to help with your appetite and constipation.

## 2022-12-07 NOTE — Telephone Encounter (Signed)
(  4:00 pm) PC SW left a message for patient's interpreter to return call regarding palliative care referral.

## 2022-12-08 ENCOUNTER — Ambulatory Visit (HOSPITAL_COMMUNITY)
Admission: RE | Admit: 2022-12-08 | Discharge: 2022-12-08 | Disposition: A | Discharge status: Home / Self Care | Payer: Medicaid Other | Source: Ambulatory Visit | Attending: Hematology | Admitting: Hematology

## 2022-12-08 ENCOUNTER — Other Ambulatory Visit: Payer: Self-pay

## 2022-12-08 ENCOUNTER — Other Ambulatory Visit (HOSPITAL_COMMUNITY): Payer: Self-pay

## 2022-12-08 DIAGNOSIS — C20 Malignant neoplasm of rectum: Secondary | ICD-10-CM | POA: Diagnosis present

## 2022-12-08 MED ORDER — IOHEXOL 9 MG/ML PO SOLN: 500.0000 mL | ORAL | Status: AC

## 2022-12-08 MED ORDER — IOHEXOL 9 MG/ML PO SOLN
ORAL | Status: AC
  Filled 2022-12-08: qty 1000

## 2022-12-08 MED ORDER — SODIUM CHLORIDE (PF) 0.9 % IJ SOLN
INTRAMUSCULAR | Status: AC
  Filled 2022-12-08: qty 50

## 2022-12-08 MED ORDER — IOHEXOL 300 MG/ML  SOLN
100.0000 mL | Freq: Once | INTRAMUSCULAR | Status: AC | PRN
  Administered 2022-12-08: 100 mL via INTRAVENOUS

## 2022-12-10 ENCOUNTER — Encounter: Payer: Self-pay | Admitting: Student

## 2022-12-10 DIAGNOSIS — K6289 Other specified diseases of anus and rectum: Secondary | ICD-10-CM | POA: Insufficient documentation

## 2022-12-10 NOTE — Assessment & Plan Note (Signed)
Patient states that she has an anal pain that is relieved with her tramadol. Patient would like to avoid using this medication. Discussed with her that she could trial topical lidocaine to see if this helps her symptoms.

## 2022-12-10 NOTE — Progress Notes (Signed)
   CC: f/u of appetite and constipation  HPI:  [name] is a 72 y.o. F with PMH per below. She presents for follow up from her clinic visit 08/2022. Patient states that she continues to have a poor appetite despite resuming her mirtazapine. She states that she does not have any nausea or vomiting. She also has no dysphagia or odynophagia. When she sees the food she simply has no desire to eat. She does note that she continues to drink water and her ensure shakes. She has lost about 12 lbs since 07/2022.  Regarding her constipation she states that this has improved. She does note a burning sensation in her anal region which she has been prescribed as needed tramadol for. She states she usually takes about one tablet a day.   Of note, patient last saw oncology 5/6. At that clinic visit it was noted that she had persistent fatigue, low appetite, constipation, and intermittent abodminal cramps concerning for progression of her rectal adenocarcinoma. A CT C/A/P was ordered and patient has an appointment coming to discuss whether to resume chemotherapy. This is being done after today's visit.     Past Medical History:  Diagnosis Date   Bilateral wrist pain 10/25/2017   Hypertension    Left leg DVT (deep venous thrombosis) (HCC) 06/2022   Neck mass 1998   Unknown biopsy results. Excised in [place].   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    Physical Exam:  Vitals:   12/07/22 1016  BP: 109/72  Pulse: 66  Temp: 97.8 F (36.6 C)  TempSrc: Oral  SpO2: 99%   Constitutional: Well-developed, well-nourished, and in no distress.  Cardiovascular: Normal rate, regular rhythm, intact distal pulses. No gallop and no friction rub.  No murmur heard. No lower extremity edema  Pulmonary: Non labored  breathing on room air, no wheezing or rales  Abdominal: Soft. Normal bowel sounds. Non distended and non tender Musculoskeletal: Normal range of motion.        General: No tenderness or edema.  Neurological: Alert and oriented to person, place, and time. Non focal  Skin: Skin is warm and dry.    Assessment & Plan:   See Encounters Tab for problem based charting.  Patient discussed with Dr.  Sol Blazing

## 2022-12-10 NOTE — Assessment & Plan Note (Signed)
Patient continues to have weight loss and poor appetite likely related to her malignancy.   Will increase her mirtazapine to 30mg  Will refer patient to palliative care for further assistance with appetite and anal pain

## 2022-12-11 ENCOUNTER — Encounter: Payer: Self-pay | Admitting: *Deleted

## 2022-12-12 ENCOUNTER — Encounter: Payer: Self-pay | Admitting: Hematology

## 2022-12-12 ENCOUNTER — Other Ambulatory Visit: Payer: Self-pay

## 2022-12-12 ENCOUNTER — Inpatient Hospital Stay (HOSPITAL_BASED_OUTPATIENT_CLINIC_OR_DEPARTMENT_OTHER): Payer: Medicaid Other | Admitting: Hematology

## 2022-12-12 ENCOUNTER — Other Ambulatory Visit (HOSPITAL_COMMUNITY): Payer: Self-pay

## 2022-12-12 VITALS — BP 126/77 | HR 73 | Temp 98.1°F | Resp 16 | Wt 109.1 lb

## 2022-12-12 DIAGNOSIS — M79604 Pain in right leg: Secondary | ICD-10-CM | POA: Diagnosis not present

## 2022-12-12 DIAGNOSIS — M79605 Pain in left leg: Secondary | ICD-10-CM

## 2022-12-12 DIAGNOSIS — C20 Malignant neoplasm of rectum: Secondary | ICD-10-CM

## 2022-12-12 DIAGNOSIS — I82432 Acute embolism and thrombosis of left popliteal vein: Secondary | ICD-10-CM

## 2022-12-12 MED ORDER — CAPECITABINE 500 MG PO TABS
ORAL_TABLET | ORAL | 2 refills | Status: DC
  Filled 2022-12-12: qty 56, fill #0
  Filled 2022-12-25: qty 56, 14d supply, fill #0
  Filled 2023-01-17: qty 56, 14d supply, fill #1
  Filled 2023-02-07: qty 56, 14d supply, fill #2

## 2022-12-12 NOTE — Congregational Nurse Program (Addendum)
Accompanied patient to [facility] appointment with Dr. Mosetta Putt. C-T scan results were explained and patient agreed to re-start chemo (both oral and IV) starting 12/18/2022.  She may also be interested in re-evaluation by surgeon to see if surgery is still an option.  After the visit CN picked up the following prescriptions from [facility] Pharmacy:  lidocaine ointment, Xarelto, gabapentin, mirtazapine and lisinopril-hctz and took it to patient's home. Also took 3 bottles of magnesium citrate which was recommended by Dr. Mosetta Putt for constipation. Patient instructed to drink 1/2 bottle followed by 8 oz. Water. If no results she can repeat but not to drink more than 1 bottle in 24 hours. During home visit Holly called from [facility] pharmacy to see if patient has any Xeloda left from previous prescription since patient stopped the chemo due to side effects. Patient located bottle which contained 57 tablets.  She will start on 12/18/2022 taking 2 tablets twice a day. Reminded her of tomorrow's eye doctor appointment with Dr. Dione Booze and that her ride should arrive around 1:20 pm.  Medicaid transportation will be requested for 06/03 appointment.  [name] RN, Congregational Nurse [phone]

## 2022-12-12 NOTE — Assessment & Plan Note (Signed)
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 -Despite strong recommendation, patient firmly and repeatedly declined IV chemo and surgery. She agreed to chemoRT with Xeloda, and received the treatment 01/04/21 - 02/14/21. -she developed local cancer progression and lung mets in 11/2021 -she started Xeloda on 02/16/2022, low dose oxaliplatin was added on 05/11/22 (with cycle 4 of Xeloda)  -chemo held since 07/2022 due to leg pain and recurrent nausea  -restaging CT 09/07/2022 showed stable disease in rectum and lung, no new lesions.  I personally reviewed her CT scan images and compared to the previous scans, her lung lesion is minimal now she has had excellent response so far. -Due to her significant fatigue, limited performance status, partially related to chemotherapy and recent sacrum fracture, she started chemo break in late feb 2024 -restaging CT 12/08/2022 showed

## 2022-12-12 NOTE — Progress Notes (Signed)
[facility]   Telephone:([phone] [phone]   Clinic Follow up Note   Patient Care Team: Doran Stabler, DO as PCP - General (Internal Medicine) [name], RN (Inactive) as Oncology Nurse Navigator [name], NP as Nurse Practitioner (Nurse Practitioner) Malachy Mood, MD as Consulting Physician (Hematology and Oncology) Dorothy Puffer, MD as Consulting Physician (Radiation Oncology) [name], Netty Starring., MD as Consulting Physician (Gastroenterology) Josephine Igo, DO as Consulting Physician (Pulmonary Disease) Karie Soda, MD as Consulting Physician (General Surgery)  Date of Service:  12/12/2022  CHIEF COMPLAINT: f/u of colon and rectal cancer       CURRENT THERAPY:  Plan to restart oxaliplatin and Xeloda (Capeox)  ASSESSMENT:  Jillian Lutz is a 72 y.o. female with   Rectal adenocarcinoma (HCC) cT3cN1M0 stage IIIb, biopsy confirmed residual primary tumor and oligo lung met in 09/2021 -Initially diagnosed 11/2020 by colonoscopy for progressive rectal pain and bleeding, weight loss, and constipation. -Despite strong recommendation, patient firmly and repeatedly declined IV chemo and surgery. She agreed to chemoRT with Xeloda, and received the treatment 01/04/21 - 02/14/21. -she developed local cancer progression and lung mets in 11/2021 -she started Xeloda on 02/16/2022, low dose oxaliplatin was added on 05/11/22 (with cycle 4 of Xeloda)  -chemo held since 07/2022 due to leg pain and recurrent nausea  -restaging CT 09/07/2022 showed stable disease in rectum and lung, no new lesions.  I personally reviewed her CT scan images and compared to the previous scans, her lung lesion is minimal now she has had excellent response so far. -Due to her significant fatigue, limited performance status, partially related to chemotherapy and recent sacrum fracture, she started chemo break in late feb 2024 -restaging CT 12/08/2022 showed persistent wall thickening along  the rectal was at adjacent severe inflammation stranding and nodularity.  This is worse than last scan in February 2024.  No other new metastatic lesion or progression in her lung. -She has persistent cancer related symptoms including pain, constipation, and fatigue.  I recommend her to restart CapeOx which she responded well previously.  After lengthy discussion, she finally agreed with dose reduction. -She also asked the option of surgery, she would likely need a permanent ostomy, after the discussion, she is not sure if she wants to proceed. -Plan to start CapeOx next week.  Left leg DVT (HCC) -Doppler on July 13, 2022 showed left acute DVT involving popliteal and peroneal vein, CTA chest was negative for PE. -She started Xarelto, tolerating well    Leg pain -started in Dec 2023 -MRI 08/15/22 showed new extensive abnormal signal in the sacral ala bilaterally which is most likely secondary to bilateral insufficiency fractures -pain has near resolved now.  She takes tramadol as needed.      PLAN: --I recommends Magnesium citrate for constipation -Reviewed CT scan w/ patient -mild progression in rectum  -Recommend restarting CapeOx -Pt agrees to start next week at reduce dose, will reduce oxaliplatin to 85 mg/m2 every 3 weeks, and Xeloda 2 1000 mg twice daily for 14 days on, and 7 days off, I refilled today    SUMMARY OF ONCOLOGIC HISTORY: Oncology History Overview Note  Cancer Staging Rectal adenocarcinoma [facility]) Staging form: Colon and Rectum, AJCC 8th Edition - Clinical stage from 12/21/2020: Stage IIIB (cT3, cN1, cM0) - Unsigned    Rectal adenocarcinoma (HCC)  08/26/2020 Miscellaneous   Initial presentation to PCP, reporting intermittent BRBPR since 02/2020    12/01/2020 Procedure   Colonoscopy by Dr. Lavon Paganini  findings - The perianal and digital rectal examinations were normal. - A 15 mm polyp was found in the ascending colon. The polyp was semi-pedunculated. Resection and  retrieval were complete. - A 25 mm polyp was found in the sigmoid colon. The polyp was pedunculated. Resection and retrieval were complete. - An infiltrative partially obstructing large mass was found in the proximal rectum. The mass was partially circumferential (involving one-half of the lumen circumference). The mass measured eight cm in length extending from 10 to 18cm from anal verge. This was biopsied with a cold forceps for histology. Proximal and distal opposite fold area of the mass lesion was tattooed with an injection of total 3 mL of Spot (carbon black).   12/01/2020 Initial Biopsy   Diagnosis 1. Colon, polyp(s), ascending x 1 - ADENOCARCINOMA ARISING IN A TUBULAR ADENOMA WITH HIGH-GRADE DYSPLASIA. SEE NOTE 2. Colon, sigmoid polyp, x1 - TUBULOVILLOUS ADENOMA(S) - NEGATIVE FOR HIGH-GRADE DYSPLASIA OR MALIGNANCY 3. Rectum, biopsy - ADENOCARCINOMA. SEE NOTE   12/01/2020 Cancer Staging   Cancer Staging Rectal adenocarcinoma [facility]) Staging form: Colon and Rectum, AJCC 8th Edition - Clinical stage from 12/21/2020: Stage IIIB (cT3, cN1, cM0) - Unsigned    12/06/2020 Imaging   CT CAP with contrast IMPRESSION: 1. There is partially circumferential soft tissue thickening of the mid to superior rectum, approximately 5 cm in length and the inferior extent approximately 6 cm above the anal verge, consistent with primary rectal malignancy identified by colonoscopy. 2. There appear to be abnormally enlarged perirectal lymph nodes posteriorly about the superior rectum measuring up to 1.0 x 0.8 cm. Findings are suspicious for perirectal nodal metastatic disease, however rectal MRI is the test of choice for initial local staging of rectal cancer. 3. There is a 4 mm nonspecific pulmonary nodule of the superior segment left lower lobe, statistically most likely incidental, infectious or inflammatory, although nonspecific and isolated metastatic disease is not strictly excluded. Attention on  follow-up. 4. No other evidence of metastatic disease in the chest, abdomen, or pelvis. Aortic Atherosclerosis (ICD10-I70.0).   12/09/2020 Imaging   Local staging MRI pelvis without contrast IMPRESSION: 4.9 cm circumferential mid/lower rectal mass, corresponding to the patient's newly diagnosed rectal cancer. Rectal adenocarcinoma T stage: T3c Rectal adenocarcinoma N stage:  N1 Distance from tumor to the internal anal sphincter is 3.7 cm.   12/21/2020 Initial Diagnosis   Rectal adenocarcinoma (HCC)   01/04/2021 -  Chemotherapy   Concurrent chemoradiation with Xeloda 1000mg  in the AM and 1500mg  in the PM on days of Radiation starting 01/04/21.       01/04/2021 - 02/11/2021 Radiation Therapy   Concurrent chemoradiation with Dr Mitzi Hansen and Xeloda starting 01/04/21.    01/31/2022 Procedure   Colonoscopy, Dr. Lavon Paganini  Findings: - An infiltrative partially obstructing large mass was found in the rectum. The mass was circumferential. The mass measured ten cm in length, extending from 5-15cm from anal verge. In addition, rectal diameter at narrowest approx twelve mm. Oozing was present. Biopsies were taken with a cold forceps for histology.  Impression: - Hemorrhoids found on perianal exam. - Likely malignant partially obstructing tumor in the rectum. Biopsied. - Non-bleeding external and internal hemorrhoids.   01/31/2022 Pathology Results   Diagnosis Rectum, biopsy INVASIVE MODERATELY DIFFERENTIATED ADENOCARCINOMA   05/11/2022 -  Chemotherapy   Patient is on Treatment Plan : COLORECTAL Xelox (Capeox)(130/850) q21d     09/07/2022 Imaging    IMPRESSION: 1. No significant change in circumferential wall thickening of the rectum, in keeping with  known primary rectal mass. Similar appearance of post treatment perirectal and presacral fat stranding and fascial thickening 2. Unchanged nodule of the superior segment left lower lobe with adjacent bandlike scarring. No new nodules. 3. No  evidence of lymphadenopathy or other metastatic disease in the chest, abdomen, or pelvis. 4. Large burden of stool throughout the colon. 5. Cardiomegaly.   12/08/2022 Imaging    IMPRESSION: Persistent wall thickening along the rectum with adjacent severe inflammatory stranding and nodularity. Please correlate with exact location of the distribution of the patient's neoplasm.   No discrete new mass lesion, fluid collection or lymph node enlargement.   Improved visualization of the fractures involving the left side of the pubic symphysis as well as probable insufficiency fractures along the sacrum. Associated areas of increasing heterogeneous bony sclerosis along the pubic bones and sacrum. In principle sclerotic bone metastases would be in the differential but are felt to be less likely based on the overall appearance. If needed confirmatory MRI can be considered as clinically appropriate to further delineate.   Stable scarring and fibrotic changes along the lungs with a tiny nodular area along the superior segment of the left lower lobe. Simple continued follow up.   Enlarged heart.      INTERVAL HISTORY:  Jillian Lutz is here for a follow up of colon and rectal cancer . She was last seen by me on 11/20/2022. She presents to the clinic and accompanied by interpreter and sponsor. Pt state that she has constant abdomen pain and she makes a BM and takes Tramadol it has helped, but this morning she got no relief and she has  not had a BM in two days. Pt state that she takes Miralax . Pt denies having numbness and Tingling.   All other systems were reviewed with the patient and are negative.  MEDICAL HISTORY:  Past Medical History:  Diagnosis Date   Bilateral wrist pain 10/25/2017   Hypertension    Left leg DVT (deep venous thrombosis) (HCC) 06/2022   Neck mass 1998   Unknown biopsy results. Excised in [place].   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 (HCC)  11/2020   Rectal cancer (HCC)    Trigger finger, acquired 02/08/2017    SURGICAL HISTORY: Past Surgical History:  Procedure Laterality Date   BRONCHIAL BIOPSY  12/13/2021   Procedure: BRONCHIAL BIOPSIES;  Surgeon: Josephine Igo, DO;  Location: [facility];  Service: Pulmonary;;   BRONCHIAL NEEDLE ASPIRATION BIOPSY  12/13/2021   Procedure: BRONCHIAL NEEDLE ASPIRATION BIOPSIES;  Surgeon: Josephine Igo, DO;  Location: [facility];  Service: Pulmonary;;   NECK SURGERY Left 1998   Excision of mass in [place]   VIDEO BRONCHOSCOPY WITH RADIAL ENDOBRONCHIAL ULTRASOUND  12/13/2021   Procedure: RADIAL ENDOBRONCHIAL ULTRASOUND;  Surgeon: Josephine Igo, DO;  Location: [facility];  Service: Pulmonary;;    I have reviewed the social history and family history with the patient and they are unchanged from previous note.  ALLERGIES:  is allergic to no known allergies.  MEDICATIONS:  Current Outpatient Medications  Medication Sig Dispense Refill   capecitabine (XELODA) 500 MG tablet Take 2 tabs every 12 hours, for 14 days then off for 7 days. Take after meal 56 tablet 2   amLODipine (NORVASC) 5 MG tablet Take 1 tablet (5 mg total) by mouth daily. 90 tablet 3   brimonidine (ALPHAGAN) 0.2 % ophthalmic solution Place 1 drop into the right eye 2 (two) times daily. 15 mL 3  brimonidine (ALPHAGAN) 0.2 % ophthalmic solution Place 1 drop into the right eye 2 (two) times daily. 10 mL 11   dorzolamide-timolol (COSOPT) 2-0.5 % ophthalmic solution Place 1 drop into both eyes 2 times daily. 30 mL 3   dorzolamide-timolol (COSOPT) 2-0.5 % ophthalmic solution Instill 1 drop into both eyes twice a day 10 mL 11   gabapentin (NEURONTIN) 100 MG capsule Take 1 capsule (100 mg total) by mouth 3 (three) times daily as needed. 90 capsule 2   latanoprost (XALATAN) 0.005 % ophthalmic solution Instill 1 drop into both eyes every night at bedtime 7.5 mL 3   latanoprost (XALATAN) 0.005 % ophthalmic solution  Place 1 drop into both eyes at bedtime. 7.5 mL 6   lidocaine (XYLOCAINE) 5 % ointment Apply to anal area daily as needed. 35.44 g 0   lisinopril-hydrochlorothiazide (ZESTORETIC) 20-12.5 MG tablet Take 2 tablets by mouth daily. 60 tablet 3   mirtazapine (REMERON) 30 MG tablet Take 1 tablet (30 mg total) by mouth at bedtime. 30 tablet 1   Multiple Vitamins-Minerals (MULTIVITAMIN WITH MINERALS) tablet Take 1 tablet by mouth daily. Gummy 50 mg     polyethylene glycol powder (GLYCOLAX/MIRALAX) 17 GM/SCOOP powder Take 1 Container by mouth once.     prochlorperazine (COMPAZINE) 10 MG tablet Take 1 tablet (10 mg total) by mouth every 6 (six) hours as needed. 30 tablet 2   rivaroxaban (XARELTO) 20 MG TABS tablet Take 1 tablet (20 mg total) by mouth daily with supper. 30 tablet 2   traMADol (ULTRAM) 50 MG tablet Take 1 tablet (50 mg total) by mouth every 8 (eight) hours as needed. 60 tablet 0   urea (CARMOL) 10 % cream Apply topically 2 times daily as needed. 85 g 3   No current facility-administered medications for this visit.    PHYSICAL EXAMINATION: ECOG PERFORMANCE STATUS: 1 - Symptomatic but completely ambulatory  Vitals:   12/12/22 1117  BP: 126/77  Pulse: 73  Resp: 16  Temp: 98.1 F (36.7 C)  SpO2: 98%   Wt Readings from Last 3 Encounters:  12/12/22 109 lb 1.6 oz (49.5 kg)  11/20/22 112 lb 1.6 oz (50.8 kg)  09/14/22 115 lb 14.4 oz (52.6 kg)     GENERAL:alert, no distress and comfortable SKIN: skin color, texture, turgor are normal, no rashes or significant lesions EYES: normal, Conjunctiva are pink and non-injected, sclera clear NECK: supple, thyroid normal size, non-tender, without nodularity LYMPH:  no palpable lymphadenopathy in the cervical, axillary  LUNGS: clear to auscultation and percussion with normal breathing effort HEART: regular rate & rhythm and no murmurs and no lower extremity edema ABDOMEN:abdomen soft, non-tender and normal bowel sounds Musculoskeletal:no  cyanosis of digits and no clubbing  NEURO: alert & oriented x 3 with fluent speech, no focal motor/sensory deficits  LABORATORY DATA:  I have reviewed the data as listed    Latest Ref Rng & Units 11/20/2022   11:11 AM 08/24/2022    9:28 AM 08/03/2022    8:54 AM  CBC  WBC 4.0 - 10.5 K/uL 6.9  4.6  3.1   Hemoglobin 12.0 - 15.0 g/dL 16.1  09.6  04.5   Hematocrit 36.0 - 46.0 % 33.9  37.3  35.4   Platelets 150 - 400 K/uL 309  228  182         Latest Ref Rng & Units 11/20/2022   11:11 AM 08/24/2022    9:28 AM 08/03/2022    8:54 AM  CMP  Glucose 70 -  99 mg/dL 94  782  956   BUN 8 - 23 mg/dL 14  8  7    Creatinine 0.44 - 1.00 mg/dL 2.13  0.86  5.78   Sodium 135 - 145 mmol/L 137  139  141   Potassium 3.5 - 5.1 mmol/L 3.9  3.4  3.6   Chloride 98 - 111 mmol/L 103  106  105   CO2 22 - 32 mmol/L 28  27  30    Calcium 8.9 - 10.3 mg/dL 8.9  9.1  9.2   Total Protein 6.5 - 8.1 g/dL 7.4  6.9  6.9   Total Bilirubin 0.3 - 1.2 mg/dL 0.4  0.5  0.4   Alkaline Phos 38 - 126 U/L 82  86  83   AST 15 - 41 U/L 14  22  24    ALT 0 - 44 U/L <5  <5  6       RADIOGRAPHIC STUDIES: I have personally reviewed the radiological images as listed and agreed with the findings in the report. No results found.    Orders Placed This Encounter  Procedures   CBC with Differential (Cancer Center Only)    Standing Status:   Future    Standing Expiration Date:   12/18/2023   CMP (Cancer Center only)    Standing Status:   Future    Standing Expiration Date:   12/18/2023   CBC with Differential (Cancer Center Only)    Standing Status:   Future    Standing Expiration Date:   01/08/2024   CMP (Cancer Center only)    Standing Status:   Future    Standing Expiration Date:   01/08/2024   CBC with Differential (Cancer Center Only)    Standing Status:   Future    Standing Expiration Date:   01/29/2024   CMP (Cancer Center only)    Standing Status:   Future    Standing Expiration Date:   01/29/2024   All questions were  answered. The patient knows to call the clinic with any problems, questions or concerns. No barriers to learning was detected. The total time spent in the appointment was 40 minutes.     Malachy Mood, MD 12/12/2022   Carolin Coy, CMA, am acting as scribe for Malachy Mood, MD.   I have reviewed the above documentation for accuracy and completeness, and I agree with the above.   \

## 2022-12-12 NOTE — Assessment & Plan Note (Signed)
-  started in Dec 2023 -MRI 08/15/22 showed new extensive abnormal signal in the sacral ala bilaterally which is most likely secondary to bilateral insufficiency fractures -pain has near resolved now.  She takes tramadol as needed.

## 2022-12-12 NOTE — Assessment & Plan Note (Signed)
-  Doppler on July 13, 2022 showed left acute DVT involving popliteal and peroneal vein, CTA chest was negative for PE. -She started Xarelto, tolerating well. 

## 2022-12-13 NOTE — Congregational Nurse Program (Signed)
Accompanied patient to eye doctor appointment with Dr. Fabian Sharp.  [name] served as Equities trader.  Patient's post cataract exam was normal and patient instructed to D/C al eyes drops except for Latanoprost (1 gtt HS both eyes) and liquid tears as needed.  She ordered glasses today and has follow-up appointment on 01/10/2023 at 2:00 pm.  [name] RN, Congregational Nurse [phone]

## 2022-12-13 NOTE — Progress Notes (Signed)
Internal Medicine Clinic Attending  Case discussed with Dr. Carter  At the time of the visit.  We reviewed the resident's history and exam and pertinent patient test results.  I agree with the assessment, diagnosis, and plan of care documented in the resident's note.  

## 2022-12-14 ENCOUNTER — Other Ambulatory Visit: Payer: Self-pay

## 2022-12-14 DIAGNOSIS — H5213 Myopia, bilateral: Secondary | ICD-10-CM | POA: Diagnosis not present

## 2022-12-15 MED FILL — Dexamethasone Sodium Phosphate Inj 100 MG/10ML: INTRAMUSCULAR | Qty: 1 | Status: AC

## 2022-12-17 NOTE — Assessment & Plan Note (Signed)
-  Doppler on July 13, 2022 showed left acute DVT involving popliteal and peroneal vein, CTA chest was negative for PE. -She started Xarelto, tolerating well. 

## 2022-12-17 NOTE — Assessment & Plan Note (Signed)
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 -Despite strong recommendation, patient firmly and repeatedly declined IV chemo and surgery. She agreed to chemoRT with Xeloda, and received the treatment 01/04/21 - 02/14/21. -she developed local cancer progression and lung mets in 11/2021 -she started Xeloda on 02/16/2022, low dose oxaliplatin was added on 05/11/22 (with cycle 4 of Xeloda)  -chemo held since 07/2022 due to leg pain and recurrent nausea  -restaging CT 09/07/2022 showed stable disease in rectum and lung, no new lesions.  I personally reviewed her CT scan images and compared to the previous scans, her lung lesion is minimal now she has had excellent response so far. -Due to her significant fatigue, limited performance status, partially related to chemotherapy and recent sacrum fracture, she started chemo break in late feb 2024 -restaging CT 12/08/2022 showed persistent wall thickening along the rectal was at adjacent severe inflammation stranding and nodularity.  This is worse than last scan in February 2024.  No other new metastatic lesion or progression in her lung. -She has persistent cancer related symptoms including pain, constipation, and fatigue.  I recommend her to restart CapeOx which she responded well previously.  After lengthy discussion, she finally agreed with restarting chemo with dose reduction. -she is scheduled to start CapeOx today

## 2022-12-18 ENCOUNTER — Encounter: Payer: Self-pay | Admitting: Hematology

## 2022-12-18 ENCOUNTER — Other Ambulatory Visit: Payer: Self-pay

## 2022-12-18 ENCOUNTER — Other Ambulatory Visit (HOSPITAL_COMMUNITY): Payer: Self-pay

## 2022-12-18 ENCOUNTER — Inpatient Hospital Stay: Payer: Medicaid Other

## 2022-12-18 ENCOUNTER — Inpatient Hospital Stay: Payer: Medicaid Other | Attending: Physician Assistant

## 2022-12-18 ENCOUNTER — Inpatient Hospital Stay (HOSPITAL_BASED_OUTPATIENT_CLINIC_OR_DEPARTMENT_OTHER): Payer: Medicaid Other | Admitting: Hematology

## 2022-12-18 VITALS — BP 151/78 | HR 88 | Temp 98.1°F | Resp 18 | Ht 59.0 in | Wt 109.8 lb

## 2022-12-18 VITALS — BP 108/67 | HR 62 | Resp 17

## 2022-12-18 DIAGNOSIS — D509 Iron deficiency anemia, unspecified: Secondary | ICD-10-CM

## 2022-12-18 DIAGNOSIS — C20 Malignant neoplasm of rectum: Secondary | ICD-10-CM

## 2022-12-18 DIAGNOSIS — C7802 Secondary malignant neoplasm of left lung: Secondary | ICD-10-CM | POA: Diagnosis not present

## 2022-12-18 DIAGNOSIS — R112 Nausea with vomiting, unspecified: Secondary | ICD-10-CM

## 2022-12-18 DIAGNOSIS — Z5111 Encounter for antineoplastic chemotherapy: Secondary | ICD-10-CM | POA: Insufficient documentation

## 2022-12-18 DIAGNOSIS — I82432 Acute embolism and thrombosis of left popliteal vein: Secondary | ICD-10-CM

## 2022-12-18 LAB — CBC WITH DIFFERENTIAL (CANCER CENTER ONLY)
Abs Immature Granulocytes: 0.03 10*3/uL (ref 0.00–0.07)
Basophils Absolute: 0 10*3/uL (ref 0.0–0.1)
Basophils Relative: 1 %
Eosinophils Absolute: 0.2 10*3/uL (ref 0.0–0.5)
Eosinophils Relative: 3 %
HCT: 32.1 % — ABNORMAL LOW (ref 36.0–46.0)
Hemoglobin: 10.6 g/dL — ABNORMAL LOW (ref 12.0–15.0)
Immature Granulocytes: 0 %
Lymphocytes Relative: 11 %
Lymphs Abs: 0.8 10*3/uL (ref 0.7–4.0)
MCH: 24 pg — ABNORMAL LOW (ref 26.0–34.0)
MCHC: 33 g/dL (ref 30.0–36.0)
MCV: 72.8 fL — ABNORMAL LOW (ref 80.0–100.0)
Monocytes Absolute: 0.7 10*3/uL (ref 0.1–1.0)
Monocytes Relative: 9 %
Neutro Abs: 5.5 10*3/uL (ref 1.7–7.7)
Neutrophils Relative %: 76 %
Platelet Count: 294 10*3/uL (ref 150–400)
RBC: 4.41 MIL/uL (ref 3.87–5.11)
RDW: 15.8 % — ABNORMAL HIGH (ref 11.5–15.5)
WBC Count: 7.3 10*3/uL (ref 4.0–10.5)
nRBC: 0 % (ref 0.0–0.2)

## 2022-12-18 LAB — CMP (CANCER CENTER ONLY)
ALT: <5 U/L (ref 0–44)
AST: 15 U/L (ref 15–41)
Albumin: 3.6 g/dL (ref 3.5–5.0)
Alkaline Phosphatase: 74 U/L (ref 38–126)
Anion gap: 6 (ref 5–15)
BUN: 12 mg/dL (ref 8–23)
CO2: 29 mmol/L (ref 22–32)
Calcium: 9.1 mg/dL (ref 8.9–10.3)
Chloride: 103 mmol/L (ref 98–111)
Creatinine: 0.72 mg/dL (ref 0.44–1.00)
GFR, Estimated: >60 mL/min (ref >60)
Glucose, Bld: 89 mg/dL (ref 70–99)
Potassium: 3.5 mmol/L (ref 3.5–5.1)
Sodium: 138 mmol/L (ref 135–145)
Total Bilirubin: 0.4 mg/dL (ref 0.3–1.2)
Total Protein: 7.2 g/dL (ref 6.5–8.1)

## 2022-12-18 LAB — TOTAL PROTEIN, URINE DIPSTICK: Protein, ur: NEGATIVE mg/dL

## 2022-12-18 LAB — FERRITIN: Ferritin: 44 ng/mL (ref 11–307)

## 2022-12-18 MED ORDER — SODIUM CHLORIDE 0.9 % IV SOLN: INTRAVENOUS | Status: DC

## 2022-12-18 MED ORDER — SODIUM CHLORIDE 0.9 % IV SOLN
10.0000 mg | Freq: Once | INTRAVENOUS | Status: AC
  Administered 2022-12-18: 10 mg via INTRAVENOUS
  Filled 2022-12-18: qty 10

## 2022-12-18 MED ORDER — DIPHENHYDRAMINE HCL 50 MG/ML IJ SOLN
25.0000 mg | Freq: Once | INTRAMUSCULAR | Status: AC
  Administered 2022-12-18: 25 mg via INTRAVENOUS
  Filled 2022-12-18: qty 1

## 2022-12-18 MED ORDER — OXALIPLATIN CHEMO INJECTION 100 MG/20ML
110.0000 mg/m2 | Freq: Once | INTRAVENOUS | Status: AC
  Administered 2022-12-18: 170 mg via INTRAVENOUS
  Filled 2022-12-18: qty 34

## 2022-12-18 MED ORDER — FAMOTIDINE IN NACL 20-0.9 MG/50ML-% IV SOLN
20.0000 mg | Freq: Once | INTRAVENOUS | Status: AC
  Administered 2022-12-18: 20 mg via INTRAVENOUS
  Filled 2022-12-18: qty 50

## 2022-12-18 MED ORDER — DEXTROSE 5 % IV SOLN: Freq: Once | INTRAVENOUS | Status: AC

## 2022-12-18 MED ORDER — PALONOSETRON HCL INJECTION 0.25 MG/5ML
0.2500 mg | Freq: Once | INTRAVENOUS | Status: AC
  Administered 2022-12-18: 0.25 mg via INTRAVENOUS
  Filled 2022-12-18: qty 5

## 2022-12-18 MED ORDER — ONDANSETRON HCL 8 MG PO TABS
8.0000 mg | ORAL_TABLET | Freq: Three times a day (TID) | ORAL | 2 refills | Status: DC | PRN
  Filled 2022-12-18: qty 30, 10d supply, fill #0

## 2022-12-18 MED ORDER — SODIUM CHLORIDE 0.9 % IV SOLN
7.5000 mg/kg | Freq: Once | INTRAVENOUS | Status: AC
  Administered 2022-12-18: 400 mg via INTRAVENOUS
  Filled 2022-12-18: qty 16

## 2022-12-18 MED ORDER — PROCHLORPERAZINE MALEATE 10 MG PO TABS
10.0000 mg | ORAL_TABLET | Freq: Four times a day (QID) | ORAL | 2 refills | Status: DC | PRN
Start: 2022-12-18 — End: 2024-02-18
  Filled 2022-12-18: qty 30, 8d supply, fill #0

## 2022-12-18 NOTE — Patient Instructions (Signed)
[facility]  Discharge Instructions: Thank you for choosing [facility] to provide your oncology and hematology care.   If you have a lab appointment with the Cancer Center, please go directly to the Cancer Center and check in at the registration area.   Wear comfortable clothing and clothing appropriate for easy access to any Portacath or PICC line.   We strive to give you quality time with your provider. You may need to reschedule your appointment if you arrive late (15 or more minutes).  Arriving late affects you and other patients whose appointments are after yours.  Also, if you miss three or more appointments without notifying the office, you may be dismissed from the clinic at the provider's discretion.      For prescription refill requests, have your pharmacy contact our office and allow 72 hours for refills to be completed.    Today you received the following chemotherapy and/or immunotherapy agents : Bevacizumab, Oxaliplatin      To help prevent nausea and vomiting after your treatment, we encourage you to take your nausea medication as directed.  BELOW ARE SYMPTOMS THAT SHOULD BE REPORTED IMMEDIATELY: *FEVER GREATER THAN 100.4 F (38 C) OR HIGHER *CHILLS OR SWEATING *NAUSEA AND VOMITING THAT IS NOT CONTROLLED WITH YOUR NAUSEA MEDICATION *UNUSUAL SHORTNESS OF BREATH *UNUSUAL BRUISING OR BLEEDING *URINARY PROBLEMS (pain or burning when urinating, or frequent urination) *BOWEL PROBLEMS (unusual diarrhea, constipation, pain near the anus) TENDERNESS IN MOUTH AND THROAT WITH OR WITHOUT PRESENCE OF ULCERS (sore throat, sores in mouth, or a toothache) UNUSUAL RASH, SWELLING OR PAIN  UNUSUAL VAGINAL DISCHARGE OR ITCHING   Items with * indicate a potential emergency and should be followed up as soon as possible or go to the Emergency Department if any problems should occur.  Please show the CHEMOTHERAPY ALERT CARD or IMMUNOTHERAPY  ALERT CARD at check-in to the Emergency Department and triage nurse.  Should you have questions after your visit or need to cancel or reschedule your appointment, please contact [facility]  Dept: [phone]  and follow the prompts.  Office hours are 8:00 a.m. to 4:30 p.m. Monday - Friday. Please note that voicemails left after 4:00 p.m. may not be returned until the following business day.  We are closed weekends and major holidays. You have access to a nurse at all times for urgent questions. Please call the main number to the clinic Dept: [phone] and follow the prompts.   For any non-urgent questions, you may also contact your provider using MyChart. We now offer e-Visits for anyone 70 and older to request care online for non-urgent symptoms. For details visit [email or web address].   Also download the MyChart app! Go to the app store, search "MyChart", open the app, select [place], and log in with your MyChart username and password.  Bevacizumab Injection What is this medication? BEVACIZUMAB (be va SIZ yoo mab) treats some types of cancer. It works by blocking a protein that causes cancer cells to grow and multiply. This helps to slow or stop the spread of cancer cells. It is a monoclonal antibody. This medicine may be used for other purposes; ask your health care provider or pharmacist if you have questions. COMMON BRAND NAME(S): Alymsys, Avastin, MVASI, Omer Jack What should I tell my care team before I take this medication? They need to know if you have any of these conditions: Blood clots Coughing up blood Having or recent  surgery Heart failure High blood pressure History of a connection between 2 or more body parts that do not usually connect (fistula) History of a tear in your stomach or intestines Protein in your urine An unusual or allergic reaction to bevacizumab, other medications, foods, dyes, or preservatives Pregnant or trying  to get pregnant Breast-feeding How should I use this medication? This medication is injected into a vein. It is given by your care team in a hospital or clinic setting. Talk to your care team the use of this medication in children. Special care may be needed. Overdosage: If you think you have taken too much of this medicine contact a poison control center or emergency room at once. NOTE: This medicine is only for you. Do not share this medicine with others. What if I miss a dose? Keep appointments for follow-up doses. It is important not to miss your dose. Call your care team if you are unable to keep an appointment. What may interact with this medication? Interactions are not expected. This list may not describe all possible interactions. Give your health care provider a list of all the medicines, herbs, non-prescription drugs, or dietary supplements you use. Also tell them if you smoke, drink alcohol, or use illegal drugs. Some items may interact with your medicine. What should I watch for while using this medication? Your condition will be monitored carefully while you are receiving this medication. You may need blood work while taking this medication. This medication may make you feel generally unwell. This is not uncommon as chemotherapy can affect healthy cells as well as cancer cells. Report any side effects. Continue your course of treatment even though you feel ill unless your care team tells you to stop. This medication may increase your risk to bruise or bleed. Call your care team if you notice any unusual bleeding. Before having surgery, talk to your care team to make sure it is ok. This medication can increase the risk of poor healing of your surgical site or wound. You will need to stop this medication for 28 days before surgery. After surgery, wait at least 28 days before restarting this medication. Make sure the surgical site or wound is healed enough before restarting this  medication. Talk to your care team if questions. Talk to your care team if you may be pregnant. Serious birth defects can occur if you take this medication during pregnancy and for 6 months after the last dose. Contraception is recommended while taking this medication and for 6 months after the last dose. Your care team can help you find the option that works for you. Do not breastfeed while taking this medication and for 6 months after the last dose. This medication can cause infertility. Talk to your care team if you are concerned about your fertility. What side effects may I notice from receiving this medication? Side effects that you should report to your care team as soon as possible: Allergic reactions--skin rash, itching, hives, swelling of the face, lips, tongue, or throat Bleeding--bloody or black, tar-like stools, vomiting blood or brown material that looks like coffee grounds, red or dark brown urine, small red or purple spots on skin, unusual bruising or bleeding Blood clot--pain, swelling, or warmth in the leg, shortness of breath, chest pain Heart attack--pain or tightness in the chest, shoulders, arms, or jaw, nausea, shortness of breath, cold or clammy skin, feeling faint or lightheaded Heart failure--shortness of breath, swelling of the ankles, feet, or hands, sudden weight gain, unusual  weakness or fatigue Increase in blood pressure Infection--fever, chills, cough, sore throat, wounds that don't heal, pain or trouble when passing urine, general feeling of discomfort or being unwell Infusion reactions--chest pain, shortness of breath or trouble breathing, feeling faint or lightheaded Kidney injury--decrease in the amount of urine, swelling of the ankles, hands, or feet Stomach pain that is severe, does not go away, or gets worse Stroke--sudden numbness or weakness of the face, arm, or leg, trouble speaking, confusion, trouble walking, loss of balance or coordination, dizziness,  severe headache, change in vision Sudden and severe headache, confusion, change in vision, seizures, which may be signs of posterior reversible encephalopathy syndrome (PRES) Side effects that usually do not require medical attention (report to your care team if they continue or are bothersome): Back pain Change in taste Diarrhea Dry skin Increased tears Nosebleed This list may not describe all possible side effects. Call your doctor for medical advice about side effects. You may report side effects to FDA at 1-800-FDA-1088. Where should I keep my medication? This medication is given in a hospital or clinic. It will not be stored at home. NOTE: This sheet is a summary. It may not cover all possible information. If you have questions about this medicine, talk to your doctor, pharmacist, or health care provider.  2024 [employer]vier/Gold Standard (2021-11-18 00:00:00)

## 2022-12-18 NOTE — Congregational Nurse Program (Signed)
Accompanied patient to appointment at [facility] with Dr. Mosetta Putt and chemotherapy. Picked up prescriptions for generic Zofran and Compazine from [facility] as well as OTC multivitamins with high doses of iron (100%) and Vitamin B12 (250%) and gave them to patient while still at [facility].  Reviewed medication instructions with assistance from [facility], interpreter.  Patient instructed to call CN with concerns if she does not feel well following chemo.  [name] RN, Congregational Nurse [phone].  [name] RN, Congregational Nurse [phone] .

## 2022-12-18 NOTE — Progress Notes (Signed)
[facility]   [phone] [phone]   Clinic Follow up Note   Patient Care Team: Doran Stabler, DO as PCP - General (Internal Medicine) [name], RN (Inactive) as Oncology Nurse Navigator [name], NP as Nurse Practitioner (Nurse Practitioner) Malachy Mood, MD as Consulting Physician (Hematology and Oncology) Dorothy Puffer, MD as Consulting Physician (Radiation Oncology) [name], Netty Starring., MD as Consulting Physician (Gastroenterology) Josephine Igo, DO as Consulting Physician (Pulmonary Disease) Karie Soda, MD as Consulting Physician (General Surgery)  Date of Service:  12/18/2022  CHIEF COMPLAINT: f/u of colon and rectal cancer        CURRENT THERAPY:  Plan to restart oxaliplatin and Xeloda (Capeox)   ASSESSMENT:  Jillian Lutz is a 72 y.o. female with    Rectal adenocarcinoma (HCC) cT3cN1M0 stage IIIb, biopsy confirmed residual primary tumor and oligo lung met in 09/2021 -Initially diagnosed 11/2020 by colonoscopy for progressive rectal pain and bleeding, weight loss, and constipation. -Despite strong recommendation, patient firmly and repeatedly declined IV chemo and surgery. She agreed to chemoRT with Xeloda, and received the treatment 01/04/21 - 02/14/21. -she developed local cancer progression and lung mets in 11/2021 -she started Xeloda on 02/16/2022, low dose oxaliplatin was added on 05/11/22 (with cycle 4 of Xeloda)  -chemo held since 07/2022 due to leg pain and recurrent nausea  -restaging CT 09/07/2022 showed stable disease in rectum and lung, no new lesions.  I personally reviewed her CT scan images and compared to the previous scans, her lung lesion is minimal now she has had excellent response so far. -Due to her significant fatigue, limited performance status, partially related to chemotherapy and recent sacrum fracture, she started chemo break in late feb 2024 -restaging CT 12/08/2022 showed persistent wall thickening  along the rectal was at adjacent severe inflammation stranding and nodularity.  This is worse than last scan in February 2024.  No other new metastatic lesion or progression in her lung. -She has persistent cancer related symptoms including pain, constipation, and fatigue.  I recommend her to restart CapeOx which she responded well previously.  After lengthy discussion, she finally agreed with restarting chemo with dose reduction. -she is scheduled to start CapeOx today   Left leg DVT (HCC) -Doppler on July 13, 2022 showed left acute DVT involving popliteal and peroneal vein, CTA chest was negative for PE. -She started Xarelto, tolerating well     Leg pain -started in Dec 2023 -MRI 08/15/22 showed new extensive abnormal signal in the sacral ala bilaterally which is most likely secondary to bilateral insufficiency fractures -pain has near resolved now.  She takes tramadol as needed.       PLAN: - start taking a prenatal vitamin - reviewed lab work - give premed today before treatment including pepcid and benadryl  - reduce chemo dose to help her tolerate better -She will continue Xeloda 1000 mg twice daily for 14 days, then off for 7 days - f/u in 3 weeks with labs   SUMMARY OF ONCOLOGIC HISTORY: Oncology History Overview Note  Cancer Staging Rectal adenocarcinoma [facility]) Staging form: Colon and Rectum, AJCC 8th Edition - Clinical stage from 12/21/2020: Stage IIIB (cT3, cN1, cM0) - Unsigned    Rectal adenocarcinoma (HCC)  08/26/2020 Miscellaneous   Initial presentation to PCP, reporting intermittent BRBPR since 02/2020    12/01/2020 Procedure   Colonoscopy by Dr. Lavon Paganini findings - The perianal and digital rectal examinations were normal. - A 15 mm polyp was found in the  ascending colon. The polyp was semi-pedunculated. Resection and retrieval were complete. - A 25 mm polyp was found in the sigmoid colon. The polyp was pedunculated. Resection and retrieval were complete. - An  infiltrative partially obstructing large mass was found in the proximal rectum. The mass was partially circumferential (involving one-half of the lumen circumference). The mass measured eight cm in length extending from 10 to 18cm from anal verge. This was biopsied with a cold forceps for histology. Proximal and distal opposite fold area of the mass lesion was tattooed with an injection of total 3 mL of Spot (carbon black).   12/01/2020 Initial Biopsy   Diagnosis 1. Colon, polyp(s), ascending x 1 - ADENOCARCINOMA ARISING IN A TUBULAR ADENOMA WITH HIGH-GRADE DYSPLASIA. SEE NOTE 2. Colon, sigmoid polyp, x1 - TUBULOVILLOUS ADENOMA(S) - NEGATIVE FOR HIGH-GRADE DYSPLASIA OR MALIGNANCY 3. Rectum, biopsy - ADENOCARCINOMA. SEE NOTE   12/01/2020 Cancer Staging   Cancer Staging Rectal adenocarcinoma [facility]) Staging form: Colon and Rectum, AJCC 8th Edition - Clinical stage from 12/21/2020: Stage IIIB (cT3, cN1, cM0) - Unsigned    12/06/2020 Imaging   CT CAP with contrast IMPRESSION: 1. There is partially circumferential soft tissue thickening of the mid to superior rectum, approximately 5 cm in length and the inferior extent approximately 6 cm above the anal verge, consistent with primary rectal malignancy identified by colonoscopy. 2. There appear to be abnormally enlarged perirectal lymph nodes posteriorly about the superior rectum measuring up to 1.0 x 0.8 cm. Findings are suspicious for perirectal nodal metastatic disease, however rectal MRI is the test of choice for initial local staging of rectal cancer. 3. There is a 4 mm nonspecific pulmonary nodule of the superior segment left lower lobe, statistically most likely incidental, infectious or inflammatory, although nonspecific and isolated metastatic disease is not strictly excluded. Attention on follow-up. 4. No other evidence of metastatic disease in the chest, abdomen, or pelvis. Aortic Atherosclerosis (ICD10-I70.0).   12/09/2020 Imaging    Local staging MRI pelvis without contrast IMPRESSION: 4.9 cm circumferential mid/lower rectal mass, corresponding to the patient's newly diagnosed rectal cancer. Rectal adenocarcinoma T stage: T3c Rectal adenocarcinoma N stage:  N1 Distance from tumor to the internal anal sphincter is 3.7 cm.   12/21/2020 Initial Diagnosis   Rectal adenocarcinoma (HCC)   01/04/2021 -  Chemotherapy   Concurrent chemoradiation with Xeloda 1000mg  in the AM and 1500mg  in the PM on days of Radiation starting 01/04/21.       01/04/2021 - 02/11/2021 Radiation Therapy   Concurrent chemoradiation with Dr Mitzi Hansen and Xeloda starting 01/04/21.    01/31/2022 Procedure   Colonoscopy, Dr. Lavon Paganini  Findings: - An infiltrative partially obstructing large mass was found in the rectum. The mass was circumferential. The mass measured ten cm in length, extending from 5-15cm from anal verge. In addition, rectal diameter at narrowest approx twelve mm. Oozing was present. Biopsies were taken with a cold forceps for histology.  Impression: - Hemorrhoids found on perianal exam. - Likely malignant partially obstructing tumor in the rectum. Biopsied. - Non-bleeding external and internal hemorrhoids.   01/31/2022 Pathology Results   Diagnosis Rectum, biopsy INVASIVE MODERATELY DIFFERENTIATED ADENOCARCINOMA   05/11/2022 -  Chemotherapy   Patient is on Treatment Plan : COLORECTAL Xelox (Capeox)(130/850) q21d     09/07/2022 Imaging    IMPRESSION: 1. No significant change in circumferential wall thickening of the rectum, in keeping with known primary rectal mass. Similar appearance of post treatment perirectal and presacral fat stranding and fascial thickening 2. Unchanged  nodule of the superior segment left lower lobe with adjacent bandlike scarring. No new nodules. 3. No evidence of lymphadenopathy or other metastatic disease in the chest, abdomen, or pelvis. 4. Large burden of stool throughout the colon. 5.  Cardiomegaly.   12/08/2022 Imaging    IMPRESSION: Persistent wall thickening along the rectum with adjacent severe inflammatory stranding and nodularity. Please correlate with exact location of the distribution of the patient's neoplasm.   No discrete new mass lesion, fluid collection or lymph node enlargement.   Improved visualization of the fractures involving the left side of the pubic symphysis as well as probable insufficiency fractures along the sacrum. Associated areas of increasing heterogeneous bony sclerosis along the pubic bones and sacrum. In principle sclerotic bone metastases would be in the differential but are felt to be less likely based on the overall appearance. If needed confirmatory MRI can be considered as clinically appropriate to further delineate.   Stable scarring and fibrotic changes along the lungs with a tiny nodular area along the superior segment of the left lower lobe. Simple continued follow up.   Enlarged heart.      INTERVAL HISTORY:  Jillian Lutz is here for a follow up of colon and rectal cancer.  She was last seen by me on 12/12/2022. Patient presents to clinic with Pam and her interpreter. She states she is very nauseas. Patient hgb is low today, she is not taking any iron supplement.    All other systems were reviewed with the patient and are negative.  MEDICAL HISTORY:  Past Medical History:  Diagnosis Date   Bilateral wrist pain 10/25/2017   Hypertension    Left leg DVT (deep venous thrombosis) (HCC) 06/2022   Neck mass 1998   Unknown biopsy results. Excised in [place].   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   Rectal cancer (HCC)    Trigger finger, acquired 02/08/2017    SURGICAL HISTORY: Past Surgical History:  Procedure Laterality Date   BRONCHIAL BIOPSY  12/13/2021   Procedure: BRONCHIAL BIOPSIES;  Surgeon: Josephine Igo, DO;  Location: [facility];  Service: Pulmonary;;   BRONCHIAL  NEEDLE ASPIRATION BIOPSY  12/13/2021   Procedure: BRONCHIAL NEEDLE ASPIRATION BIOPSIES;  Surgeon: Josephine Igo, DO;  Location: [facility]OSCOPY;  Service: Pulmonary;;   NECK SURGERY Left 1998   Excision of mass in [place]   VIDEO BRONCHOSCOPY WITH RADIAL ENDOBRONCHIAL ULTRASOUND  12/13/2021   Procedure: RADIAL ENDOBRONCHIAL ULTRASOUND;  Surgeon: Josephine Igo, DO;  Location: [facility]OS[facility];  Service: Pulmonary;;    I have reviewed the social history and family history with the patient and they are unchanged from previous note.  ALLERGIES:  is allergic to no known allergies.  MEDICATIONS:  Current Outpatient Medications  Medication Sig Dispense Refill   ondansetron (ZOFRAN) 8 MG tablet Take 1 tablet (8 mg total) by mouth every 8 (eight) hours as needed for nausea or vomiting. Take after 3 days of chemo 30 tablet 2   amLODipine (NORVASC) 5 MG tablet Take 1 tablet (5 mg total) by mouth daily. 90 tablet 3   brimonidine (ALPHAGAN) 0.2 % ophthalmic solution Place 1 drop into the right eye 2 (two) times daily. 15 mL 3   brimonidine (ALPHAGAN) 0.2 % ophthalmic solution Place 1 drop into the right eye 2 (two) times daily. 10 mL 11   capecitabine (XELODA) 500 MG tablet Take 2 tabs every 12 hours, for 14 days then off for 7 days. Take after  meal 56 tablet 2   dorzolamide-timolol (COSOPT) 2-0.5 % ophthalmic solution Place 1 drop into both eyes 2 times daily. 30 mL 3   dorzolamide-timolol (COSOPT) 2-0.5 % ophthalmic solution Instill 1 drop into both eyes twice a day 10 mL 11   gabapentin (NEURONTIN) 100 MG capsule Take 1 capsule (100 mg total) by mouth 3 (three) times daily as needed. 90 capsule 2   latanoprost (XALATAN) 0.005 % ophthalmic solution Instill 1 drop into both eyes every night at bedtime 7.5 mL 3   latanoprost (XALATAN) 0.005 % ophthalmic solution Place 1 drop into both eyes at bedtime. 7.5 mL 6   lidocaine (XYLOCAINE) 5 % ointment Apply to anal area daily as needed. 35.44 g 0    lisinopril-hydrochlorothiazide (ZESTORETIC) 20-12.5 MG tablet Take 2 tablets by mouth daily. 60 tablet 3   mirtazapine (REMERON) 30 MG tablet Take 1 tablet (30 mg total) by mouth at bedtime. 30 tablet 1   Multiple Vitamins-Minerals (MULTIVITAMIN WITH MINERALS) tablet Take 1 tablet by mouth daily. Gummy 50 mg     polyethylene glycol powder (GLYCOLAX/MIRALAX) 17 GM/SCOOP powder Take 1 Container by mouth once.     prochlorperazine (COMPAZINE) 10 MG tablet Take 1 tablet (10 mg total) by mouth every 6 (six) hours as needed. 30 tablet 2   rivaroxaban (XARELTO) 20 MG TABS tablet Take 1 tablet (20 mg total) by mouth daily with supper. 30 tablet 2   traMADol (ULTRAM) 50 MG tablet Take 1 tablet (50 mg total) by mouth every 8 (eight) hours as needed. 60 tablet 0   urea (CARMOL) 10 % cream Apply topically 2 times daily as needed. 85 g 3   No current facility-administered medications for this visit.   Facility-Administered Medications Ordered in Other Visits  Medication Dose Route Frequency Provider Last Rate Last Admin   0.9 %  sodium chloride infusion   Intravenous Continuous Malachy Mood, MD   Stopped at 12/18/22 1233    PHYSICAL EXAMINATION: ECOG PERFORMANCE STATUS: 2 - Symptomatic, <50% confined to bed  Vitals:   12/18/22 1038  BP: (!) 151/78  Pulse: 88  Resp: 18  Temp: 98.1 F (36.7 C)  SpO2: 98%   Wt Readings from Last 3 Encounters:  12/18/22 109 lb 12.8 oz (49.8 kg)  12/12/22 109 lb 1.6 oz (49.5 kg)  11/20/22 112 lb 1.6 oz (50.8 kg)     GENERAL:alert, no distress and comfortable SKIN: skin color, texture, turgor are normal, no rashes or significant lesions EYES: normal, Conjunctiva are pink and non-injected, sclera clear NECK: supple, thyroid normal size, non-tender, without nodularity LYMPH:  no palpable lymphadenopathy in the cervical, axillary  LUNGS: clear to auscultation and percussion with normal breathing effort HEART: regular rate & rhythm and no murmurs and no lower extremity  edema ABDOMEN:abdomen soft, non-tender and normal bowel sounds Musculoskeletal:no cyanosis of digits and no clubbing  NEURO: alert & oriented x 3 with fluent speech, no focal motor/sensory deficits  LABORATORY DATA:  I have reviewed the data as listed    Latest Ref Rng & Units 12/18/2022    9:55 AM 11/20/2022   11:11 AM 08/24/2022    9:28 AM  CBC  WBC 4.0 - 10.5 K/uL 7.3  6.9  4.6   Hemoglobin 12.0 - 15.0 g/dL 16.1  09.6  04.5   Hematocrit 36.0 - 46.0 % 32.1  33.9  37.3   Platelets 150 - 400 K/uL 294  309  228         Latest Ref  Rng & Units 12/18/2022    9:55 AM 11/20/2022   11:11 AM 08/24/2022    9:28 AM  CMP  Glucose 70 - 99 mg/dL 89  94  409   BUN 8 - 23 mg/dL 12  14  8    Creatinine 0.44 - 1.00 mg/dL 8.11  9.14  7.82   Sodium 135 - 145 mmol/L 138  137  139   Potassium 3.5 - 5.1 mmol/L 3.5  3.9  3.4   Chloride 98 - 111 mmol/L 103  103  106   CO2 22 - 32 mmol/L 29  28  27    Calcium 8.9 - 10.3 mg/dL 9.1  8.9  9.1   Total Protein 6.5 - 8.1 g/dL 7.2  7.4  6.9   Total Bilirubin 0.3 - 1.2 mg/dL 0.4  0.4  0.5   Alkaline Phos 38 - 126 U/L 74  82  86   AST 15 - 41 U/L 15  14  22    ALT 0 - 44 U/L <5  <5  <5       RADIOGRAPHIC STUDIES: I have personally reviewed the radiological images as listed and agreed with the findings in the report. No results found.    Orders Placed This Encounter  Procedures   Ferritin    Standing Status:   Standing    Number of Occurrences:   20    Standing Expiration Date:   12/18/2023   All questions were answered. The patient knows to call the clinic with any problems, questions or concerns. No barriers to learning was detected. The total time spent in the appointment was 25 minutes.     Malachy Mood, MD 12/18/2022   I, Sharlette Dense, CMA, am acting as scribe for Malachy Mood, MD.   I have reviewed the above documentation for accuracy and completeness, and I agree with the above.

## 2022-12-20 ENCOUNTER — Telehealth: Payer: Self-pay

## 2022-12-20 NOTE — Telephone Encounter (Signed)
Phone call to patient with assistance from interpreter Y'Siu Hlong.  Patient stated she continues to have trouble sleeping and eating. C/O epigastric burning and pain in lower abdomen.  Not having regular BM's and experiencing constipation.  Recommended she take Magnesium citrate.  Also c/o chills which we told patient is a side effect of chemo.  [name] RN, Congregational Nurse [phone]

## 2022-12-20 NOTE — Telephone Encounter (Signed)
Spoke with [name] (Congress/Nurse) and pt via telephone regarding pt's c/o of abdominal pain, n/v, burning in upper chest, insomnia, and constipation.  Pt took 1/2 bottle of Mag Citrate this morning but has not had a BM.  Instructed pt to drink the other half of the bottle and to continue drinking the Miralax as directed by Dr. Mosetta Putt.  Also, instructed pt to start taking Tums and Pepcid OTC for the heartburn she's experiencing.  Pt rated her abdominal pain at 7/10 but it resolved with taking Tramadol 50mg .  Pt is taking Zofran and Compazine for the nausea & vomiting with is also contributing to the pt's constipation.  Pt is currently taking Capecitabine and had Oxaliplatin on 12/18/2022.  Pt stated she's not eating or drinking much due to n/v and abdominal pain.  Instructed pt to try to drink plenty of fluids and to move around as much as possible to help get the bowel moving.  Pt verbalized understanding but will need reinforcement teaching.  Instructed pt to drink the entire bottle of Mag Citrate on 12/21/2022 if the pt has not had a bowel movement.  Pt stated she had a very small hard mucus like BM on 12/20/2022 before she took the 1/2 bottle of Mag Citrate.  Stated that pt will need to come in on Friday, 12/22/2022 for IVFs and to reinforcement constipation management.  Pt stated she's not been able to sleep.  Pt confirmed she's taking the Remeron but it's not helping with sleep.  Instructed pt to take the Remeron and melatonin to see if it helps with sleep.  Stated if it does not make you sleep, Dr. Mosetta Putt wants you to take your Remeron and 1 Gabapentin at night/bedtime to see if it will help with sleep.  Pt and [name] both verbalized understanding.  [name] stated she will contact Medicaid to make arrangement for the pt's appt for Friday 12/22/2022 and will pickup the Tums & Pepcid for the pt to take for heartburn.  Pt and [name] had no further questions or concerns at this time.

## 2022-12-21 ENCOUNTER — Other Ambulatory Visit: Payer: Self-pay

## 2022-12-21 ENCOUNTER — Other Ambulatory Visit (HOSPITAL_COMMUNITY): Payer: Self-pay

## 2022-12-21 DIAGNOSIS — Z515 Encounter for palliative care: Secondary | ICD-10-CM

## 2022-12-21 NOTE — Congregational Nurse Program (Signed)
Home visit to deliver the following OTC medications recommended by Dr Mosetta Putt: Melatonin, generic TUMS and pepcid ac.  Patient states she is still nauseous and has epigastric burning.  She took compazine yesterday without relief.  Will try zofran today along with pepcid.  Will also take magnesium citrate again today.  She will take 2 melatonin gummies at bedtime. Informed patient of 11:00 am appointment tomorrow at the [facility] where she will be administered IV fluids to prevent/treat dehydration.  Medicaid transportation to appointment requested by CN.  [name] RN, Congregational Nurse [phone]

## 2022-12-22 ENCOUNTER — Inpatient Hospital Stay: Payer: Medicaid Other

## 2022-12-22 ENCOUNTER — Other Ambulatory Visit: Payer: Self-pay

## 2022-12-22 VITALS — BP 132/90 | HR 76 | Temp 98.6°F | Resp 16 | Ht 59.0 in | Wt 105.5 lb

## 2022-12-22 DIAGNOSIS — E860 Dehydration: Secondary | ICD-10-CM

## 2022-12-22 DIAGNOSIS — Z5111 Encounter for antineoplastic chemotherapy: Secondary | ICD-10-CM | POA: Diagnosis not present

## 2022-12-22 MED ORDER — SODIUM CHLORIDE 0.9 % IV SOLN: INTRAVENOUS | Status: AC

## 2022-12-22 MED ORDER — ONDANSETRON HCL 4 MG/2ML IJ SOLN
8.0000 mg | Freq: Once | INTRAMUSCULAR | Status: AC
  Administered 2022-12-22: 8 mg via INTRAVENOUS
  Filled 2022-12-22: qty 4

## 2022-12-22 NOTE — Progress Notes (Signed)
COMMUNITY PALLIATIVE CARE SW NOTE  PATIENT NAME: Jillian Lutz DOB: 1950-10-17 MRN: [id number]  PRIMARY CARE PROVIDER: Doran Stabler, DO  RESPONSIBLE PARTY:  Acct ID - Guarantor Home Phone Work Phone Relationship Acct Type  [id number] [name], [name] [phone]  Self P/F     [address] [name], [place] [address]   Initial Palliative Care Encounter/Clinical Social Work  [employer] SW completed an initial palliative care encounter with patient's congregational nurse-[name]. SW discussed the palliative care program and role in patient's care. [name] advised that patient has restarted chemotherapy after 3 months on Monday. She has not felt well since that time where she is having increased fatigue and weakness.   SW scheduled a joint follow-up visit with congregational nurse for 12/27/22 @ 2:30 pm.     Social History   Tobacco Use   Smoking status: Never   Smokeless tobacco: Never  Substance Use Topics   Alcohol use: No    CODE STATUS: Full Code ADVANCED DIRECTIVES: No MOST FORM COMPLETE: No HOSPICE EDUCATION PROVIDED: No  Duration of encounter and education: 30 minutes  [employer], LCSW

## 2022-12-25 ENCOUNTER — Other Ambulatory Visit (HOSPITAL_COMMUNITY): Payer: Self-pay

## 2022-12-25 ENCOUNTER — Other Ambulatory Visit: Payer: Self-pay

## 2022-12-27 ENCOUNTER — Other Ambulatory Visit: Payer: Self-pay

## 2022-12-29 ENCOUNTER — Other Ambulatory Visit: Payer: Self-pay

## 2022-12-29 ENCOUNTER — Other Ambulatory Visit (HOSPITAL_COMMUNITY): Payer: Self-pay

## 2023-01-03 NOTE — Congregational Nurse Program (Signed)
Home visit with interpreter [name].  Delivered Xeloda (which was picked up from [facility]).  Stated she completed last course on 12/30/2022.  She will start next round on 01/08/2023 prior to appointment for chemo infusion.  Continues to complain of loss of appetite and low energy.  Reminded patient of the following appointments next week: 1) 06/24 [facility] 2) 06/25 [facility] 3) 06/26 1:00 pm home visit from [facility] social worker and nurse 4) 06/26 2:00 pm [facility].  [name] RN, Congregational Nurse [phone]

## 2023-01-08 ENCOUNTER — Other Ambulatory Visit (HOSPITAL_COMMUNITY): Payer: Self-pay

## 2023-01-08 ENCOUNTER — Ambulatory Visit: Payer: Medicaid Other | Admitting: Student

## 2023-01-08 ENCOUNTER — Other Ambulatory Visit: Payer: Self-pay

## 2023-01-08 ENCOUNTER — Encounter: Payer: Self-pay | Admitting: Student

## 2023-01-08 VITALS — BP 123/69 | HR 57 | Temp 97.8°F | Ht <= 58 in | Wt 107.6 lb

## 2023-01-08 DIAGNOSIS — R634 Abnormal weight loss: Secondary | ICD-10-CM

## 2023-01-08 DIAGNOSIS — C20 Malignant neoplasm of rectum: Secondary | ICD-10-CM

## 2023-01-08 DIAGNOSIS — K6289 Other specified diseases of anus and rectum: Secondary | ICD-10-CM

## 2023-01-08 DIAGNOSIS — R630 Anorexia: Secondary | ICD-10-CM | POA: Diagnosis not present

## 2023-01-08 MED ORDER — MIRTAZAPINE 30 MG PO TABS
30.0000 mg | ORAL_TABLET | Freq: Every day | ORAL | 1 refills | Status: DC
Start: 2023-01-08 — End: 2023-04-24
  Filled 2023-01-08: qty 30, 30d supply, fill #0
  Filled 2023-02-27 (×2): qty 30, 30d supply, fill #1

## 2023-01-08 MED ORDER — TRAMADOL HCL 50 MG PO TABS
50.0000 mg | ORAL_TABLET | Freq: Three times a day (TID) | ORAL | 0 refills | Status: DC | PRN
  Filled 2023-01-08: qty 60, 20d supply, fill #0

## 2023-01-08 MED FILL — Dexamethasone Sodium Phosphate Inj 100 MG/10ML: INTRAMUSCULAR | Qty: 1 | Status: AC

## 2023-01-08 NOTE — Patient Instructions (Signed)
Thank you, [name] for allowing [place] to provide your care today. Today we discussed your colon cancer, loss of appetite and rectal pain. I have sent refills of your pain medicine as well as you mirtazapine to your pharmacy.  Please follow-up with oncology for your infusions as scheduled.  Patient to discuss your symptoms with palliative care on Wednesday so they can help manage them appropriately.  My Chart Access: [email or web address]  Please follow-up in 3 months or as needed  Please make sure to arrive 15 minutes prior to your next appointment. If you arrive late, you may be asked to reschedule.    We look forward to seeing you next time. Please call our clinic at [phone] if you have any questions or concerns. The best time to call is Monday-Friday from 9am-4pm, but there is someone available 24/7. If after hours or the weekend, call the main hospital number and ask for the Internal Medicine Resident On-Call. If you need medication refills, please notify your pharmacy one week in advance and they will send [place] a request.   Thank you for letting us take part in your care. Wishing you the best!  Steffanie Rainwater, MD 01/08/2023, 11:43 AM IM Resident, PGY-3 Duwayne Heck 41:10

## 2023-01-08 NOTE — Progress Notes (Unsigned)
[facility] OFFICE PROGRESS NOTE  Doran Stabler, DO [address] [place] [address]  DIAGNOSIS: f/u of colon and rectal cancer    Oncology History Overview Note  Cancer Staging Rectal adenocarcinoma [facility]) Staging form: Colon and Rectum, AJCC 8th Edition - Clinical stage from 12/21/2020: Stage IIIB (cT3, cN1, cM0) - Unsigned    Rectal adenocarcinoma (HCC)  08/26/2020 Miscellaneous   Initial presentation to PCP, reporting intermittent BRBPR since 02/2020    12/01/2020 Procedure   Colonoscopy by Dr. Lavon Paganini findings - The perianal and digital rectal examinations were normal. - A 15 mm polyp was found in the ascending colon. The polyp was semi-pedunculated. Resection and retrieval were complete. - A 25 mm polyp was found in the sigmoid colon. The polyp was pedunculated. Resection and retrieval were complete. - An infiltrative partially obstructing large mass was found in the proximal rectum. The mass was partially circumferential (involving one-half of the lumen circumference). The mass measured eight cm in length extending from 10 to 18cm from anal verge. This was biopsied with a cold forceps for histology. Proximal and distal opposite fold area of the mass lesion was tattooed with an injection of total 3 mL of Spot (carbon black).   12/01/2020 Initial Biopsy   Diagnosis 1. Colon, polyp(s), ascending x 1 - ADENOCARCINOMA ARISING IN A TUBULAR ADENOMA WITH HIGH-GRADE DYSPLASIA. SEE NOTE 2. Colon, sigmoid polyp, x1 - TUBULOVILLOUS ADENOMA(S) - NEGATIVE FOR HIGH-GRADE DYSPLASIA OR MALIGNANCY 3. Rectum, biopsy - ADENOCARCINOMA. SEE NOTE   12/01/2020 Cancer Staging   Cancer Staging Rectal adenocarcinoma [facility]) Staging form: Colon and Rectum, AJCC 8th Edition - Clinical stage from 12/21/2020: Stage IIIB (cT3, cN1, cM0) - Unsigned    12/06/2020 Imaging   CT CAP with contrast IMPRESSION: 1. There is partially circumferential soft tissue thickening of the mid to superior  rectum, approximately 5 cm in length and the inferior extent approximately 6 cm above the anal verge, consistent with primary rectal malignancy identified by colonoscopy. 2. There appear to be abnormally enlarged perirectal lymph nodes posteriorly about the superior rectum measuring up to 1.0 x 0.8 cm. Findings are suspicious for perirectal nodal metastatic disease, however rectal MRI is the test of choice for initial local staging of rectal cancer. 3. There is a 4 mm nonspecific pulmonary nodule of the superior segment left lower lobe, statistically most likely incidental, infectious or inflammatory, although nonspecific and isolated metastatic disease is not strictly excluded. Attention on follow-up. 4. No other evidence of metastatic disease in the chest, abdomen, or pelvis. Aortic Atherosclerosis (ICD10-I70.0).   12/09/2020 Imaging   Local staging MRI pelvis without contrast IMPRESSION: 4.9 cm circumferential mid/lower rectal mass, corresponding to the patient's newly diagnosed rectal cancer. Rectal adenocarcinoma T stage: T3c Rectal adenocarcinoma N stage:  N1 Distance from tumor to the internal anal sphincter is 3.7 cm.   12/21/2020 Initial Diagnosis   Rectal adenocarcinoma (HCC)   01/04/2021 -  Chemotherapy   Concurrent chemoradiation with Xeloda 1000mg  in the AM and 1500mg  in the PM on days of Radiation starting 01/04/21.       01/04/2021 - 02/11/2021 Radiation Therapy   Concurrent chemoradiation with Dr Mitzi Hansen and Xeloda starting 01/04/21.    01/31/2022 Procedure   Colonoscopy, Dr. Lavon Paganini  Findings: - An infiltrative partially obstructing large mass was found in the rectum. The mass was circumferential. The mass measured ten cm in length, extending from 5-15cm from anal verge. In addition, rectal diameter at narrowest approx twelve mm. Oozing was present.  Biopsies were taken with a cold forceps for histology.  Impression: - Hemorrhoids found on perianal exam. - Likely  malignant partially obstructing tumor in the rectum. Biopsied. - Non-bleeding external and internal hemorrhoids.   01/31/2022 Pathology Results   Diagnosis Rectum, biopsy INVASIVE MODERATELY DIFFERENTIATED ADENOCARCINOMA   05/11/2022 -  Chemotherapy   Patient is on Treatment Plan : COLORECTAL Xelox (Capeox)(130/850) q21d     09/07/2022 Imaging    IMPRESSION: 1. No significant change in circumferential wall thickening of the rectum, in keeping with known primary rectal mass. Similar appearance of post treatment perirectal and presacral fat stranding and fascial thickening 2. Unchanged nodule of the superior segment left lower lobe with adjacent bandlike scarring. No new nodules. 3. No evidence of lymphadenopathy or other metastatic disease in the chest, abdomen, or pelvis. 4. Large burden of stool throughout the colon. 5. Cardiomegaly.   12/08/2022 Imaging    IMPRESSION: Persistent wall thickening along the rectum with adjacent severe inflammatory stranding and nodularity. Please correlate with exact location of the distribution of the patient's neoplasm.   No discrete new mass lesion, fluid collection or lymph node enlargement.   Improved visualization of the fractures involving the left side of the pubic symphysis as well as probable insufficiency fractures along the sacrum. Associated areas of increasing heterogeneous bony sclerosis along the pubic bones and sacrum. In principle sclerotic bone metastases would be in the differential but are felt to be less likely based on the overall appearance. If needed confirmatory MRI can be considered as clinically appropriate to further delineate.   Stable scarring and fibrotic changes along the lungs with a tiny nodular area along the superior segment of the left lower lobe. Simple continued follow up.   Enlarged heart.     CURRENT THERAPY: Restarted oxaliplatin and Xeloda (Capeox) on 12/18/22  INTERVAL HISTORY: Teyana Glanzer  72 y.o. female returns to the clinic today for a follow-up visit accompanied by her interpretor and nurse.  The patient has rectal cancer.  The patient was hesitant to start systemic chemotherapy initially, but she eventually started treatment in 2023.  She was previously undergoing treatment with Xeloda and low-dose oxaliplatin.  However, the patient has significant intolerance to chemotherapy and she had a chemo break for several months.   Unfortunately, she had a restaging CT scan on 12/08/2022 which showed severe inflammation and stranding and nodularity in the rectal area which is worse compared to February 2023 without any other signs of metastatic disease.  She is also having persistent cancer related symptoms including pain, constipation, fatigue, Dr. Mosetta Putt recommended that she restarted CAPOX since she responded well previously.  She restarted this on 12/18/2022. She called in the interval on 12/20/22 with the concerns of fatigue, poor appetite, epigastric discomfort/burning, lower abdominal discomfort, and constipation.   Today, she feels fair. She feels well enough for treatment when asked. She attributes her fatigue to lack of appetite. She reports having to force herself to eat. She is scheduled to see a member of the nutritionist team while in the infusion room today. She would be interested in receiving IVF later this week to stay ahead of her symptoms. She tries to drink 2 protein supplement drinks per day. She takes remeron for her appetite and insomnia. Of note, it looks like her dose of remeron was increased last month. She lost about 2 lbs since last being seen.  Regarding appetite stimulants, Marinol is currently on backorder for several months.  She is not a good candidate for  Megace due to her history of DVT in December 2023  In addition to remeron for insomnia, she also takes melatonin.   Of note, she saw her PCP yesterday.  He refilled her tramadol for her cancer related pain.  He also  referred her to palliative care who she is scheduled to see tomorrow.  The patient denies any abdominal pain at this time but continues to report rectal pain which is a 7 out of 10.  She reportedly only takes tramadol once a day.  While her tramadol was written to be taken every 8 hours, she only has been taking it once a day because she is worried she will "run out".  She does not take any Tylenol or other medications in between her tramadol doses.  She took tramadol this morning as prescribed.  It is unclear based on a long discussion at the patient continues to have constipation or not.  At 1 point during the encounter she states that she is having soft bowel movements and later stated she had not had a bowel movement in 3 days.  She was previously taking MiraLAX and Dulcolax.  Magnesium citrate causes nausea and she therefore tries to avoid magnesium citrate.   She denies any fever or night sweats.  She denies any breathing changes.  She denies any rashes or skin changes she is here today for evaluation and repeat blood work before considering starting cycle #2.   MEDICAL HISTORY: Past Medical History:  Diagnosis Date   Bilateral wrist pain 10/25/2017   Hypertension    Left leg DVT (deep venous thrombosis) (HCC) 06/2022   Neck mass 1998   Unknown biopsy results. Excised in [place].   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  MEDICATIONS:  Current Outpatient Medications  Medication Sig Dispense Refill   amLODipine (NORVASC) 5 MG tablet Take 1 tablet (5 mg total) by mouth daily. 90 tablet 3   brimonidine (ALPHAGAN) 0.2 % ophthalmic solution Place 1 drop into the right eye 2 (two) times daily. 15 mL 3   brimonidine (ALPHAGAN) 0.2 % ophthalmic solution Place 1 drop into the right eye 2 (two) times daily. 10 mL 11   capecitabine (XELODA) 500 MG  tablet Take 2 tabs every 12 hours, for 14 days then off for 7 days. Take after meal 56 tablet 2   dorzolamide-timolol (COSOPT) 2-0.5 % ophthalmic solution Place 1 drop into both eyes 2 times daily. 30 mL 3   dorzolamide-timolol (COSOPT) 2-0.5 % ophthalmic solution Instill 1 drop into both eyes twice a day 10 mL 11   gabapentin (NEURONTIN) 100 MG capsule Take 1 capsule (100 mg total) by mouth 3 (three) times daily as needed. 90 capsule 2   latanoprost (XALATAN) 0.005 % ophthalmic solution Instill 1 drop into both eyes every night at bedtime 7.5 mL 3   latanoprost (XALATAN) 0.005 % ophthalmic solution Place 1 drop into both eyes at bedtime. 7.5 mL 6   lidocaine (XYLOCAINE) 5 % ointment Apply to anal area daily as needed. 50 g 1   lisinopril-hydrochlorothiazide (ZESTORETIC) 20-12.5 MG tablet Take 2 tablets by mouth daily. 60 tablet 3   mirtazapine (REMERON) 30 MG tablet Take 1 tablet (30 mg total) by mouth at bedtime. 30 tablet 1   Multiple Vitamins-Minerals (MULTIVITAMIN WITH MINERALS) tablet Take 1 tablet by mouth daily. Gummy  50 mg     ondansetron (ZOFRAN) 8 MG tablet Take 1 tablet (8 mg total) by mouth every 8 (eight) hours as needed for nausea or vomiting. Take after 3 days of chemo 30 tablet 2   polyethylene glycol powder (GLYCOLAX/MIRALAX) 17 GM/SCOOP powder Take 1 Container by mouth once.     prochlorperazine (COMPAZINE) 10 MG tablet Take 1 tablet (10 mg total) by mouth every 6 (six) hours as needed. 30 tablet 2   rivaroxaban (XARELTO) 20 MG TABS tablet Take 1 tablet (20 mg total) by mouth daily with supper. 30 tablet 2   traMADol (ULTRAM) 50 MG tablet Take 1 tablet (50 mg total) by mouth every 8 (eight) hours as needed. 60 tablet 0   urea (CARMOL) 10 % cream Apply topically 2 times daily as needed. 85 g 3   No current facility-administered medications for this visit.   Facility-Administered Medications Ordered in Other Visits  Medication Dose Route Frequency Provider Last Rate Last  Admin   oxaliplatin (ELOXATIN) 130 mg in dextrose 5 % 500 mL chemo infusion  85 mg/m2 (Treatment Plan Recorded) Intravenous Once Malachy Mood, MD 263 mL/hr at 01/09/23 1145 130 mg at 01/09/23 1145    SURGICAL HISTORY:  Past Surgical History:  Procedure Laterality Date   BRONCHIAL BIOPSY  12/13/2021   Procedure: BRONCHIAL BIOPSIES;  Surgeon: Josephine Igo, DO;  Location: [facility];  Service: Pulmonary;;   BRONCHIAL NEEDLE ASPIRATION BIOPSY  12/13/2021   Procedure: BRONCHIAL NEEDLE ASPIRATION BIOPSIES;  Surgeon: Josephine Igo, DO;  Location: [facility];  Service: Pulmonary;;   NECK SURGERY Left 1998   Excision of mass in [place]   VIDEO BRONCHOSCOPY WITH RADIAL ENDOBRONCHIAL ULTRASOUND  12/13/2021   Procedure: RADIAL ENDOBRONCHIAL ULTRASOUND;  Surgeon: Josephine Igo, DO;  Location: [facility];  Service: Pulmonary;;    REVIEW OF SYSTEMS:   Review of Systems  Constitutional: Positive for persistent fatigue and diminished appetite.  Negative for chills and fever.  HENT:   Negative for mouth sores, nosebleeds, sore throat and trouble swallowing.   Eyes: Negative for eye problems and icterus.  Respiratory: Negative for cough, hemoptysis, shortness of breath and wheezing.   Cardiovascular: Negative for chest pain and leg swelling.  Gastrointestinal: Positive for rectal pain, intermittent constipation, and intermittent nausea.  Negative for abdominal pain, diarrhea, and vomiting.  Genitourinary: Negative for bladder incontinence, difficulty urinating, dysuria, frequency and hematuria.   Musculoskeletal: Negative for back pain, gait problem, neck pain and neck stiffness.  Skin: Negative for itching and rash.  Neurological: Negative for dizziness, extremity weakness, gait problem, headaches, light-headedness and seizures.  Hematological: Negative for adenopathy. Does not bruise/bleed easily.  Psychiatric/Behavioral: Negative for confusion, depression and sleep disturbance. The patient is  not nervous/anxious.     PHYSICAL EXAMINATION:  Blood pressure 122/82, pulse (!) 56, temperature 98.1 F (36.7 C), temperature source Temporal, resp. rate 17, weight 107 lb 1.6 oz (48.6 kg), SpO2 100 %.  ECOG PERFORMANCE STATUS: 1  Physical Exam  Constitutional: Oriented to person, place, and time and thin appearing female, and in no distress.  HENT:  Head: Normocephalic and atraumatic.  Mouth/Throat: Oropharynx is clear and moist. No oropharyngeal exudate.  Eyes: Conjunctivae are normal. Right eye exhibits no discharge. Left eye exhibits no discharge. No scleral icterus.  Neck: Normal range of motion. Neck supple.  Cardiovascular: Normal rate, regular rhythm, normal heart sounds and intact distal pulses.   Pulmonary/Chest: Effort normal and breath sounds normal. No respiratory distress. No wheezes. No  rales.  Abdominal: Soft. Bowel sounds are normal. Exhibits no distension and no mass. There is no tenderness.  Musculoskeletal: Normal range of motion. Exhibits no edema.  Lymphadenopathy:    No cervical adenopathy.  Neurological: Alert and oriented to person, place, and time. Exhibits normal muscle tone. Gait normal. Coordination normal.  Skin: Skin is warm and dry. No rash noted. Not diaphoretic. No erythema. No pallor.  Psychiatric: Mood, memory and judgment normal.  Vitals reviewed.  LABORATORY DATA: Lab Results  Component Value Date   WBC 3.6 (L) 01/09/2023   HGB 10.5 (L) 01/09/2023   HCT 32.5 (L) 01/09/2023   MCV 73.2 (L) 01/09/2023   PLT 252 01/09/2023      Chemistry      Component Value Date/Time   NA 141 01/09/2023 0713   NA 139 09/29/2019 1406   K 3.6 01/09/2023 0713   CL 106 01/09/2023 0713   CO2 28 01/09/2023 0713   BUN 11 01/09/2023 0713   BUN 12 09/29/2019 1406   CREATININE 0.78 01/09/2023 0713      Component Value Date/Time   CALCIUM 8.5 (L) 01/09/2023 0713   ALKPHOS 64 01/09/2023 0713   AST 14 (L) 01/09/2023 0713   ALT <5 01/09/2023 0713   BILITOT  0.3 01/09/2023 0713       RADIOGRAPHIC STUDIES:  No results found.   ASSESSMENT/PLAN:  Rectal adenocarcinoma (HCC) cT3cN1M0 stage IIIb, biopsy confirmed residual primary tumor and oligo lung met in 09/2021 -Initially diagnosed 11/2020 by colonoscopy for progressive rectal pain and bleeding, weight loss, and constipation. -Despite strong recommendation, patient firmly and repeatedly declined IV chemo and surgery. She agreed to chemoRT with Xeloda, and received the treatment 01/04/21 - 02/14/21. -she developed local cancer progression and lung mets in 11/2021 -she started Xeloda on 02/16/2022, low dose oxaliplatin was added on 05/11/22 (with cycle 4 of Xeloda)  -Chemo held since 07/2022 due to leg pain and recurrent nausea  -Restaging CT 09/07/2022 showed stable disease in rectum and lung, no new lesions. Her lung lesion was minimal she has had excellent response so far. -Due to her significant fatigue, limited performance status, partially related to chemotherapy and recent sacrum fracture, she started chemo break in late Feb 2024 -restaging CT 12/08/2022 showed persistent wall thickening along the rectal was at adjacent severe inflammation stranding and nodularity.  This is worse than last scan in February 2024.  No other new metastatic lesion or progression in her lung. -She has persistent cancer related symptoms including pain, constipation, and fatigue. Dr. Mosetta Putt recommend her to restart CapeOx which she responded well previously.  After lengthy discussion, she finally agreed with restarting chemo with dose reduction. -she started CapeOx on 12/18/22 -She struggle with poor appetite and fatigue. -Discussed the option of delaying treatment by 1 week to allow more time to recover versus proceeding with treatment today.  From reviewing her doses, she received 110 mg mg/m2 at her last visit. We will change the dose to 85 mg/m2 moving forward as it looks like she may have tolerated that in the past.  -She  feels well enough to proceed with the lower dose of oxaliplatin and we reviewed symptom management -Will arrange extra IVF today and Thursday this week to stay ahead of her poor p.o. intake. Of course, if she feels like she does not need IVF on Thursday, advised to call us and we can cancel it.  -Labs were reviewed, she will proceed with cycle #2.  -We will see her in 3  weeks with cycle #3.   Chemo related side effects (insomnia, poor appetite, fatigue) -On Remeron for insomnia and decreased appetite, dose of remeron increased last month.  -She is not a good candidate for megace due to her DVT in December 2023. Marinol on back order.  -Taking melatonin as well for insomnia -Nutritionist met with patient today and encouraged small frequent snacks, oral care, mouth rinses, high protein foods, and ensure plus -We also discussed taking anti-emetic prior to eating to see if it helps with the smell aversion to food.  -IVF today and Thursday, she will call to cancel if she does not feel this is needed.   Rectal Pain  -PCP prescribes tramadol TID PRN for pain -patient only taking once per day because she is afraid she will run out -60 tablets rxed by PCP yesterday. Reviewed with the patient this is TID PRN for pain. She takes this in the AM but then has pain at night. Reviewed she can take this 2-3x per day -Also may take tylenol in between tramadol -Will administer additional 650 mg of tylenol while in infusion room today for rectal pain -She is scheduled to see palliative care nurse tomorrow, which I think would be helpful for her for close monitoring and assistance managing her symptoms.  Constipation  -Unclear what she is taking for constipation -Magnesium citrate causes nausea -Her RN will help her with her bowel regimen with doculax and miralax if needed.    Left leg DVT (HCC) -Doppler on July 13, 2022 showed left acute DVT involving popliteal and peroneal vein, CTA chest was negative  for PE. -She started Xarelto, tolerating well   Leg pain -started in Dec 2023 -MRI 08/15/22 showed new extensive abnormal signal in the sacral ala bilaterally which is most likely secondary to bilateral insufficiency fractures -pain has near resolved now.  She takes tramadol as needed.  PLAN: - reviewed lab work - reduce chemo dose of oxaliplatin to 85 mg/m2 to help her tolerate better -She will continue Xeloda 1000 mg twice daily for 14 days, then off for 7 days - f/u in 3 weeks with labs -Arrange IVF today and later this week on 01/11/23 if needed. She will call if she feels like she does not need this -Tylenol 650 mg given in infusion room for rectal pain. Told she can take this in between her tramadol doses -Reviewed her tramadol is written for up to TID PRN for pain, at a minimum, I believe she will find it helpful to take this BID. She was not aware she could take this more than once per day -Continue remeron 30 mg. She is not a good candidate for megace due to hx DVT. Marinol on back order.  -Received nutrition education by dietician today  -Constipation education given -Agree to establish with palliative care tomorrow which I believe will be beneficial for close monitoring and maximize symptom management -1 L of fluid over 2 hours on 01/11/23. Orders are under sign and hold.    No orders of the defined types were placed in this encounter.    The total time spent in the appointment was 40-49 minutes  Tiare Rohlman L Michai Dieppa, PA-C 01/09/23

## 2023-01-08 NOTE — Assessment & Plan Note (Addendum)
Patient presented today for refills on her tramadol for her rectal pain. She has been taking tramadol prescribed by her oncologist for rectal pain since her cancer diagnosis 2 years ago. She denies any abdominal pain or bloody stools. Spoke with patient's [employer] nurse, [name], who informed me that patient only takes her tramadol only 1-2 times a day.  Patient is at increased risk of serotonin syndrome with the combination of her tramadol and mirtazapine. We will have to monitor her closely on her current regimen and consider switching to a different pain medication if she starts having signs of serotonin syndrome  Plans:  -Refill tramadol 50 mg every 8 hours as needed for pain -Refill lidocaine 5% ointment, apply to anal area as needed for pain -Follow-up with palliative care for further pain management

## 2023-01-08 NOTE — Progress Notes (Unsigned)
CC: Follow up  HPI:  [name] is a 72 y.o. female with PMH as below who presents to clinic accompanied by [place] interpreter for follow-up on her colon cancer, loss of appetite and rectal pain. Please see problem based charting for evaluation, assessment and plan.  Past Medical History:  Diagnosis Date   Bilateral wrist pain 10/25/2017   Hypertension    Left leg DVT (deep venous thrombosis) (HCC) 06/2022   Neck mass 1998   Unknown biopsy results. Excised in [place].   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 No acute distress. Cardiac: Mild bradycardia. Normal rhythm. No murmurs, rubs or gallops. No LE edema Respiratory: Lungs CTAB. No wheezing or crackles. Abdominal: Soft, symmetric and non tender. Normal BS. Skin: Warm, dry and intact without rashes or lesions Extremities: Atraumatic. Full ROM. Radial and DP pulses 2+ and symmetric. Neuro: A&O x 3. Moves all extremities. Normal sensation to gross touch.  Psych: Appropriate mood and affect.  Vitals:   01/08/23 1059 01/08/23 1109  BP: 135/68 123/69  Pulse: (!) 56 (!) 57  Temp: 97.8 F (36.6 C)   TempSrc: Oral   SpO2: 100%   Weight: 107 lb 9.6 oz (48.8 kg)   Height: 4\' 10"  (1.473 m)     Assessment & Plan:   Rectal or anal pain Patient presented today for refills on her tramadol for her rectal pain. She has been taking tramadol prescribed by her oncologist for rectal pain since her cancer diagnosis 2 years ago. She denies any abdominal pain or bloody stools.  Spoke with patient's Congregational nurse, [name], who informed me that patient only takes her tramadol only 1-2 times a day.  Patient is at increased risk of serotonin syndrome with the combination of her tramadol and mirtazapine. We will have to monitor her closely on her current regimen and consider switching to a different pain medication if she starts having signs of serotonin syndrome  Plans:  -Refill tramadol 50 mg every 8 hours as needed for pain -Refill lidocaine 5% ointment, apply to anal area as needed for pain -Follow-up with palliative care for further pain management   Rectal adenocarcinoma [facility]) Patient was re-started on chemotherapy last month after repeat CT scan showed worsening inflammation in her rectum without metastatic lesions.  She continues to have cancer-related pain, fatigue and loss of appetite.  She has been referred to palliative care with plan for home visit on Wednesday.   Plan: -Continue chemotherapy -Follow-up visit by palliative care scheduled for 6/26 -Continue mirtazapine 30 mg at bedtime -Continue tramadol 50 mg every 8 hours as needed for pain -Continue MiraLAX as needed for constipation -Follow-up with oncology as scheduled  Weight loss Her weight is down 2 pounds in the last 4 weeks. She continues to have poor appetite. Her appetite makes it difficult for her to drink protein shakes consistently. Her mirtazapine was increased during her last office visit. She has follow up with palliative care to assist with her cancer-related symptoms this week.  -Refill mirtazapine 30 mg at bedtime -Follow up with palliative care   See Encounters Tab  for problem based charting.  Patient discussed with Dr.  Curt Bears, MD, MPH

## 2023-01-09 ENCOUNTER — Inpatient Hospital Stay: Payer: Medicaid Other | Admitting: Physician Assistant

## 2023-01-09 ENCOUNTER — Inpatient Hospital Stay: Payer: Medicaid Other | Admitting: Dietician

## 2023-01-09 ENCOUNTER — Other Ambulatory Visit: Payer: Self-pay

## 2023-01-09 ENCOUNTER — Other Ambulatory Visit (HOSPITAL_COMMUNITY): Payer: Self-pay

## 2023-01-09 ENCOUNTER — Inpatient Hospital Stay: Payer: Medicaid Other

## 2023-01-09 ENCOUNTER — Encounter: Payer: Self-pay | Admitting: Student

## 2023-01-09 VITALS — BP 154/77 | HR 58 | Temp 98.0°F | Resp 18

## 2023-01-09 VITALS — BP 122/82 | HR 56 | Temp 98.1°F | Resp 17 | Wt 107.1 lb

## 2023-01-09 DIAGNOSIS — C20 Malignant neoplasm of rectum: Secondary | ICD-10-CM | POA: Diagnosis not present

## 2023-01-09 DIAGNOSIS — K6289 Other specified diseases of anus and rectum: Secondary | ICD-10-CM

## 2023-01-09 DIAGNOSIS — Z5111 Encounter for antineoplastic chemotherapy: Secondary | ICD-10-CM

## 2023-01-09 DIAGNOSIS — R630 Anorexia: Secondary | ICD-10-CM | POA: Diagnosis not present

## 2023-01-09 DIAGNOSIS — G893 Neoplasm related pain (acute) (chronic): Secondary | ICD-10-CM

## 2023-01-09 DIAGNOSIS — Z23 Encounter for immunization: Secondary | ICD-10-CM | POA: Insufficient documentation

## 2023-01-09 LAB — CBC WITH DIFFERENTIAL (CANCER CENTER ONLY)
Abs Immature Granulocytes: 0.01 10*3/uL (ref 0.00–0.07)
Basophils Absolute: 0.1 10*3/uL (ref 0.0–0.1)
Basophils Relative: 2 %
Eosinophils Absolute: 0.1 10*3/uL (ref 0.0–0.5)
Eosinophils Relative: 2 %
HCT: 32.5 % — ABNORMAL LOW (ref 36.0–46.0)
Hemoglobin: 10.5 g/dL — ABNORMAL LOW (ref 12.0–15.0)
Immature Granulocytes: 0 %
Lymphocytes Relative: 26 %
Lymphs Abs: 0.9 10*3/uL (ref 0.7–4.0)
MCH: 23.6 pg — ABNORMAL LOW (ref 26.0–34.0)
MCHC: 32.3 g/dL (ref 30.0–36.0)
MCV: 73.2 fL — ABNORMAL LOW (ref 80.0–100.0)
Monocytes Absolute: 0.6 10*3/uL (ref 0.1–1.0)
Monocytes Relative: 16 %
Neutro Abs: 2 10*3/uL (ref 1.7–7.7)
Neutrophils Relative %: 54 %
Platelet Count: 252 10*3/uL (ref 150–400)
RBC: 4.44 MIL/uL (ref 3.87–5.11)
RDW: 17 % — ABNORMAL HIGH (ref 11.5–15.5)
WBC Count: 3.6 10*3/uL — ABNORMAL LOW (ref 4.0–10.5)
nRBC: 0 % (ref 0.0–0.2)

## 2023-01-09 LAB — CMP (CANCER CENTER ONLY)
ALT: <5 U/L (ref 0–44)
AST: 14 U/L — ABNORMAL LOW (ref 15–41)
Albumin: 3.2 g/dL — ABNORMAL LOW (ref 3.5–5.0)
Alkaline Phosphatase: 64 U/L (ref 38–126)
Anion gap: 7 (ref 5–15)
BUN: 11 mg/dL (ref 8–23)
CO2: 28 mmol/L (ref 22–32)
Calcium: 8.5 mg/dL — ABNORMAL LOW (ref 8.9–10.3)
Chloride: 106 mmol/L (ref 98–111)
Creatinine: 0.78 mg/dL (ref 0.44–1.00)
GFR, Estimated: >60 mL/min (ref >60)
Glucose, Bld: 134 mg/dL — ABNORMAL HIGH (ref 70–99)
Potassium: 3.6 mmol/L (ref 3.5–5.1)
Sodium: 141 mmol/L (ref 135–145)
Total Bilirubin: 0.3 mg/dL (ref 0.3–1.2)
Total Protein: 6.8 g/dL (ref 6.5–8.1)

## 2023-01-09 LAB — TOTAL PROTEIN, URINE DIPSTICK: Protein, ur: NEGATIVE mg/dL

## 2023-01-09 MED ORDER — DEXTROSE 5 % IV SOLN: Freq: Once | INTRAVENOUS | Status: AC

## 2023-01-09 MED ORDER — SODIUM CHLORIDE 0.9 % IV SOLN: Freq: Once | INTRAVENOUS | Status: AC

## 2023-01-09 MED ORDER — LIDOCAINE 5 % EX OINT
1.0000 | TOPICAL_OINTMENT | Freq: Every day | CUTANEOUS | 1 refills | Status: DC
Start: 2023-01-09 — End: 2023-06-05
  Filled 2023-01-09: qty 50, 19d supply, fill #0

## 2023-01-09 MED ORDER — ACETAMINOPHEN 325 MG PO TABS
650.0000 mg | ORAL_TABLET | Freq: Once | ORAL | Status: AC
  Administered 2023-01-09: 650 mg via ORAL
  Filled 2023-01-09: qty 2

## 2023-01-09 MED ORDER — SODIUM CHLORIDE 0.9 % IV SOLN
7.5000 mg/kg | Freq: Once | INTRAVENOUS | Status: AC
  Administered 2023-01-09: 400 mg via INTRAVENOUS
  Filled 2023-01-09: qty 16

## 2023-01-09 MED ORDER — FAMOTIDINE IN NACL 20-0.9 MG/50ML-% IV SOLN
20.0000 mg | Freq: Once | INTRAVENOUS | Status: AC
  Administered 2023-01-09: 20 mg via INTRAVENOUS
  Filled 2023-01-09: qty 50

## 2023-01-09 MED ORDER — SODIUM CHLORIDE 0.9 % IV SOLN
10.0000 mg | Freq: Once | INTRAVENOUS | Status: AC
  Administered 2023-01-09: 10 mg via INTRAVENOUS
  Filled 2023-01-09: qty 10

## 2023-01-09 MED ORDER — OXALIPLATIN CHEMO INJECTION 100 MG/20ML
85.0000 mg/m2 | Freq: Once | INTRAVENOUS | Status: AC
  Administered 2023-01-09: 130 mg via INTRAVENOUS
  Filled 2023-01-09: qty 7.85

## 2023-01-09 MED ORDER — DIPHENHYDRAMINE HCL 50 MG/ML IJ SOLN
25.0000 mg | Freq: Once | INTRAMUSCULAR | Status: AC
  Administered 2023-01-09: 25 mg via INTRAVENOUS
  Filled 2023-01-09: qty 1

## 2023-01-09 MED ORDER — PALONOSETRON HCL INJECTION 0.25 MG/5ML
0.2500 mg | Freq: Once | INTRAVENOUS | Status: AC
  Administered 2023-01-09: 0.25 mg via INTRAVENOUS
  Filled 2023-01-09: qty 5

## 2023-01-09 NOTE — Congregational Nurse Program (Signed)
Accompanied patient to Cancer Center appointment with NP Cassie. Patient continues to complain of nausea and lethargy however she is sleeping better with Melatonin.  States no BM for 2 days but they have been soft recently.  She is not taking miralax and dulcolax on same day but will start today.  She started her Xeloda today and agreed to proceed with IV chemo today at lower dose.  C/O rectal pain with some relief from tramadol which she only takes once per day because she is afraid she will run out.  Recommended she take 1 in am and 1 in pm since pain is worse at night.  She will be seen by nutritionist during today's visit.  Return appointment scheduled for IV fluids in 2 days (01/11/2023).  CN will arrange transportation.  [name] RN, Congregational Nurse [phone]

## 2023-01-09 NOTE — Patient Instructions (Signed)
[place] [facility]  Discharge Instructions: Thank you for choosing [facility] to provide your oncology and hematology care.   If you have a lab appointment with the Cancer Center, please go directly to the Cancer Center and check in at the registration area.   Wear comfortable clothing and clothing appropriate for easy access to any Portacath or PICC line.   We strive to give you quality time with your provider. You may need to reschedule your appointment if you arrive late (15 or more minutes).  Arriving late affects you and other patients whose appointments are after yours.  Also, if you miss three or more appointments without notifying the office, you may be dismissed from the clinic at the provider's discretion.      For prescription refill requests, have your pharmacy contact our office and allow 72 hours for refills to be completed.    Today you received the following chemotherapy and/or immunotherapy agents Vegzelma, Oxaliplatin.      To help prevent nausea and vomiting after your treatment, we encourage you to take your nausea medication as directed.  BELOW ARE SYMPTOMS THAT SHOULD BE REPORTED IMMEDIATELY: *FEVER GREATER THAN 100.4 F (38 C) OR HIGHER *CHILLS OR SWEATING *NAUSEA AND VOMITING THAT IS NOT CONTROLLED WITH YOUR NAUSEA MEDICATION *UNUSUAL SHORTNESS OF BREATH *UNUSUAL BRUISING OR BLEEDING *URINARY PROBLEMS (pain or burning when urinating, or frequent urination) *BOWEL PROBLEMS (unusual diarrhea, constipation, pain near the anus) TENDERNESS IN MOUTH AND THROAT WITH OR WITHOUT PRESENCE OF ULCERS (sore throat, sores in mouth, or a toothache) UNUSUAL RASH, SWELLING OR PAIN  UNUSUAL VAGINAL DISCHARGE OR ITCHING   Items with * indicate a potential emergency and should be followed up as soon as possible or go to the Emergency Department if any problems should occur.  Please show the CHEMOTHERAPY ALERT CARD or IMMUNOTHERAPY ALERT  CARD at check-in to the Emergency Department and triage nurse.  Should you have questions after your visit or need to cancel or reschedule your appointment, please contact [place] [facility]  Dept: [phone]  and follow the prompts.  Office hours are 8:00 a.m. to 4:30 p.m. Monday - Friday. Please note that voicemails left after 4:00 p.m. may not be returned until the following business day.  We are closed weekends and major holidays. You have access to a nurse at all times for urgent questions. Please call the main number to the clinic Dept: [phone] and follow the prompts.   For any non-urgent questions, you may also contact your provider using MyChart. We now offer e-Visits for anyone 29 and older to request care online for non-urgent symptoms. For details visit [email or web address].   Also download the MyChart app! Go to the app store, search "MyChart", open the app, select [place], and log in with your MyChart username and password.

## 2023-01-09 NOTE — Progress Notes (Signed)
Nutrition Follow-up:  Patient on with colorectal cancer. She restarted Capeox 6/3 s/p 2 month chemo break.    Met with patient in infusion. Interpretor present at visit today. Patient reports persistent fatigue. She has poor appetite and taking appetite stimulant (mirtazapine). Patient complains foods have no taste. Patient asking if shrimp, fish, and chicken are okay foods to eat. Patient drinking 2 Ensure Original daily (220 kcal, 9 g).Patient is taking miralax for constipation.   Medications: remeron, compazine, tramadol  Labs: albumin 3.2  Anthropometrics: Wt 107 lb 1.6 oz today increased from 105 lb 8 oz on 6/7  5/6 - 112 lb 1.6 oz    NUTRITION DIAGNOSIS: Food and nutrition related knowledge deficit continues     INTERVENTION:  Discussed strategies for poor appetite, encouraged small frequent meals/snacks - eating by the clock Discussed strategies for altered taste and importance of oral care. Suggested trying baking soda salt water rinses several times daily before meals - handout with tips/recipe given Encouraged high protein foods including fish, shrimp, chicken - educated cooking until well and avoiding fried  Continue drinking 2 Ensure/day. Educated on varying nutrition profiles, recommend switching to Ensure Plus HP for added calories and protein (350 kcal, 20 g) Continue appetite stimulant  Continue bowel regimen per MD  Patient provided with one complimentary case of ensure due to one or more of the following reasons:  malnutrition, weight loss/maintenance, poor appetite AND food insecurity or financial hardship    MONITORING, EVALUATION, GOAL: weight trends, intake   NEXT VISIT: Monday July 15 during infusion with Drema Halon

## 2023-01-09 NOTE — Assessment & Plan Note (Addendum)
Patient was re-started on chemotherapy last month after repeat CT scan showed worsening inflammation in her rectum without metastatic lesions.  She continues to have cancer-related pain, fatigue and loss of appetite.  She has been referred to palliative care with plan for home visit on Wednesday.   Plan: -Continue chemotherapy -Follow-up visit by palliative care scheduled for 6/26 -Continue mirtazapine 30 mg at bedtime -Continue tramadol 50 mg every 8 hours as needed for pain -Continue MiraLAX as needed for constipation -Follow-up with oncology as scheduled

## 2023-01-09 NOTE — Assessment & Plan Note (Addendum)
Her weight is down 2 pounds in the last 4 weeks. She continues to have poor appetite. Her appetite makes it difficult for her to drink protein shakes consistently. Her mirtazapine was increased during her last office visit. She has follow up with palliative care to assist with her cancer-related symptoms this week.  -Refill mirtazapine 30 mg at bedtime -Follow up with palliative care

## 2023-01-09 NOTE — Progress Notes (Signed)
Internal Medicine Clinic Attending  Case discussed with Dr. [place]  At the time of the visit.  We reviewed the resident's history and exam and pertinent patient test results.  I agree with the assessment, diagnosis, and plan of care documented in the resident's note.  

## 2023-01-10 ENCOUNTER — Other Ambulatory Visit: Payer: Self-pay

## 2023-01-10 ENCOUNTER — Telehealth: Payer: Self-pay | Admitting: Hematology

## 2023-01-10 LAB — CEA (ACCESS): CEA (CHCC): 24.68 ng/mL — ABNORMAL HIGH (ref 0.00–5.00)

## 2023-01-10 NOTE — Telephone Encounter (Signed)
Called patient regarding upcoming July/August appointments, patient is notified. 

## 2023-01-10 NOTE — Congregational Nurse Program (Signed)
Home visit with interpreter [name] and [facility] Palliative Care Social Worker [employer] [place] Lonon who did a patient assessment and explained available services thru her program.  She will bring patient some Depends tomorrow and Ensure as soon as it becomes available.  She will also check on low income housing in th immediate neighbor.  Following this meeting we ([name] and CN) accompanied patient to eye doctor appointment with Dr. Fabian Sharp.  He stated her glaucoma  pressures are good and she needs to continue Latanoprost drops at bedtime in both eyes and artificial tears as needed.  Return in 6 months.  They will contact patient when glasses are ready for pick-up.  [name] RN, Congregational nurse [phone]

## 2023-01-11 ENCOUNTER — Inpatient Hospital Stay: Payer: Medicaid Other

## 2023-01-11 ENCOUNTER — Telehealth: Payer: Self-pay

## 2023-01-11 ENCOUNTER — Ambulatory Visit: Payer: Medicaid Other

## 2023-01-11 ENCOUNTER — Other Ambulatory Visit: Payer: Self-pay

## 2023-01-11 DIAGNOSIS — R630 Anorexia: Secondary | ICD-10-CM

## 2023-01-11 DIAGNOSIS — Z5111 Encounter for antineoplastic chemotherapy: Secondary | ICD-10-CM | POA: Diagnosis not present

## 2023-01-11 MED ORDER — SODIUM CHLORIDE 0.9 % IV SOLN: Freq: Once | INTRAVENOUS | Status: AC

## 2023-01-11 NOTE — Patient Instructions (Signed)
B n??c, Ng??i cao tu?i Rehydration, Older Adult  B n??c l vi?c thay th? d?ch, mu?i v khong ch?t trong c? th? (cc ch?t ?i?n gi?i) b? m?t trong khi m?t n??c. M?t n??c l khi khng c ?? n??c ho?c d?ch khc trong c? th?. Tnh tr?ng ny xa?y ra khi quy? vi? m?t nhi?u n??c h?n l??ng n??c qu v? u?ng vo. Nh?ng ng??i t? 65 tu?i tr? ln c nguy c? b? m?t n??c cao h?n so v?i nh?ng ng??i tr??ng thnh tr? tu?i. Nguyn nhn l ? ng??i cao tu?i, c? th?: t c kh? n?ng duy tr l??ng n??c ph h?p. C?ng khng c ?p ?ng v?i nh?ng thay ??i nhi?t ??Imagene Sheller c c?m gic kht d? dng ho?c nhanh chng. Nh?ng nguyn nhn khc bao g?m: Khng u?ng ?? n??c. Tnh tr?ng ny c th? x?y ra khi qu v? b? ?m, khi qu v? qun u?ng n??c ho?c khi qu v? th?c hi?n cc ho?t ??ng ?i h?i nhi?u n?ng l??ng, ??c bi?t l d??i th?i ti?t nng b?c. Cc tnh tr?ng gy m?t n??c ho?c cc lo?i d?ch khc. Cc tnh tr?ng ny bao g?m tiu ch?y, nn, ?? m? hi ho?c ?i ti?u nhi?u. Cc b?nh khc, ch?ng h?n nh? s?t ho?c nhi?m trng. M?t s? lo?i thu?c nh?t ??nh, ch?ng h?n nh? cc lo?i thu?c khi?n c? th? m?t n??c qu m?c (thu?c l?i ti?u). Cc tri?u ch?ng m?t n??c nh? ho?c v?a ph?i c th? bao g?m kht n??c, mi v mi?ng kh, v chng m?t. Cc tri?u ch?ng m?t n??c n?ng c th? bao g?m nh?p tim t?ng, l l?n, ng?t x?u v khng ?i ti?u. Trong cc tr??ng h?p n?ng, qu v? c th? c?n ???c truy?n d?ch qua ???ng truy?n t?nh m?ch t?i b?nh vi?n. ??i v?i cc tr??ng h?p m?t n??c nh? ho?c v?a ph?i, qu v? th??ng c th? b n??c t?i nh b?ng cch u?ng m?t s? lo?i n??c nh?t ??nh theo ch? d?n c?a chuyn gia ch?m [place] s?c kh?e. C nh?ng nguy c? no? B n??c th??ng an ton. Dng qu nhi?u n??c (b n??c qu m?c) c th? l m?t v?n ?? nh?ng hi?m g?p. B n??c qu m?c c th? gy m?t cn b?ng ?i?n gi?i trong c? th?, suy th?n, c d?ch trong ph?i, ho?c gi?m n?ng ?? mu?i (natri) trong c? th?. Cc v?t d?ng c?n thi?t: Qu v? s? c?n m?t dung d?ch b n??c d?ng u?ng (ORS) n?u chuyn  gia ch?m [place] s?c kh?e yu c?u qu v? s? d?ng. ?y l m?t lo?i ?? u?ng ?? ?i?u tr? m?t n??c. Qu v? c th? mua ? cc nh thu?c ho?c c?a hng bn l?. Cch b n??c Cc lo?i ?? l?ng Tun th? theo ch? ??n c?a chuyn gia ch?m [place] s?c kh?e v? vi?c c th? u?ng nh?ng g. Lo?i v l??ng ?? l?ng qu v? nn u?ng ph? thu?c vo tnh tr?ng c?a qu v?. Nhn chung, qu v? nn ch?n ?? u?ng m qu v? thch. U?ng ORS n?u ???c chuyn gia ch?m [place] s?c kh?e c?a qu v? ch? d?n. Pha ORS theo ch? d?n trn bao b. B?t ??u u?ng v?i l??ng nh?, kho?ng  c?c (120 mL), 5-10 pht m?t l?n. T?ng t? t? l??ng n??c u?ng cho ??n khi qu v? dng ?? s? l??ng theo khuy?n ngh? c?a chuyn gia ch?m [place] s?c kh?e. U?ng ?? ?? l?ng trong ?? gi? cho n??c ti?u c mu vng nh?t. N?u qu v? ???c ch? d?n u?ng ORS, hy u?ng xong ORS tr??c, sau ?  b?t ??u u?ng th?t t? t? cc ?? l?ng trong khc. U?ng cc ?? l?ng nh?: N??c. Lo?i n??c ny bao g?m n??c c ga v n??c c h??ng v?. Ch? u?ng n??c c th? d?n ??n qu t natri trong c? th? (h? natri huy?t). Lm theo l?i khuyn c?a chuyn gia ch?m [place] s?c kh?e. Qu v? c th? ng?m n??c ? bo. N??c p tri cy c thm n??c(pha long). ?? u?ng th? thao. Tr th?o d??c nng ho?c l?nh. Sp lm t? n??c canh. C ph. S?a ho?c cc s?n ph?m t? s?a. Th?c ph?m Tun th? ch? ??n c?a chuyn gia ch?m [place] s?c kh?e v? ?n nh?ng g trong khi qu v? b n??c. Chuyn gia ch?m [place] s?c kh?e c th? [place] qu v? b?t ??u ?n t? t? nh?ng th?c ?n thng th??ng v?i l??ng nh?. ?n cc lo?i th?c ph?m c cn b?ng cc ch?t ?i?n gi?i c l?i cho s?c kh?e, ch?ng h?n nh? chu?i, cam, khoai ty, c chua v rau bina. Th?c ph?m nhi?u d?u m? ho?c c nhi?u ???ng. Suzzette Righter m?t s? tr??ng h?p, qu v? c th? ???c cung c?p dinh d??ng thng qua m?t ?ng cho ?n ???c lu?n qua m?i vo d? dy c?a qu v? (?ng thng m?i-d? dy, hay ?ng NG). Vi?c ny c th? ???c th?c hi?n n?u qu v? b? nn m?a ho?c tiu ch?y khng ki?m sot ???c. ?? u?ng c?n trnh  M?t s? lo?i ?? u?ng c  th? lm cho tnh tr?ng m?t n??c tr?m tr?ng h?n. Trong khi qu v? b n??c, trnh u?ng r??u. Lm th? no ?? bi?t qu v? ?ang h?i ph?c sau khi m?t n??c Qu v? c th? ?? h?n n?u: Qu v? ?i ti?u th??ng xuyn h?n tr??c khi qu v? b?t ??u b n??c. N??c ti?u c?a qu v? c mu vng nh?t. M?c n?ng l??ng c?a qu v? c?i thi?n. Qu v? t b? nn h?n. Qu v? t b? tiu ch?y h?n. C?m gic thm ?n c?a qu v? c?i thi?n ho?c tr? l?i bnh th??ng. Qu v? c?m th?y t chong vng ho?c t cho?ng m??t h?n. Mu da c?a qu v? b?t ??u trng bnh th??ng h?n. Tun th? nh?ng h??ng d?n ny ? nh: Ch? s? d?ng thu?c khng k ??n v thu?c k ??n theo ch? d?n c?a chuyn gia ch?m [place] s?c kh?e. Khng dng natri d?ng vin nn. Dng nh? v?y c th? d?n ??n qu nhi?u natri trong c? th? (t?ng natri huy?t). Hy lin l?c v?i chuyn gia ch?m [place] s?c kh?e n?u: Qu v? ti?p t?c c cc tri?u ch?ng m?t n??c nh? ho?c v?a ph?i, ch?ng h?n nh?: Kha?t. Kh mi. H?i kh mi?ng. Chng m?t. N??c ti?u s?m mu ho?c t n??c ti?u h?n bnh th??ng. Co th?t c?. Qu v? ti?p t?c nn ho?c b? tiu ch?y. Yu c?u tr? gip ngay l?p t?c n?u: Qu v? c cc tri?u ch?ng m?t n??c tr?m tr?ng h?n. Qu v? b? s?t. Qu v? b? ?au ??u d? d?i. Qu v? b? nn v c cc v?n ??, ch?ng h?n nh?: Nn tr? nn tr?m tr?ng h?n. Trong ch?t nn c mu ho?c ch?t mu xanh l (m?t). Qu v? khng th? ?n ho?c u?ng m khng b? nn. Qu v? c cc v?n ?? v? ti?u ti?n ho?c ??i ti?n, ch?ng h?n nh?: Tiu ch?y tr?m tr?ng h?n. Mu trong phn (phn). Tnh tr?ng ny c th? khi?n cho phn c mu ?en v gi?ng h?c n. Khng ?i ti?u ho?c ch? ?i m?t l??ng  nh? n??c ti?u r?t ??m mu, trong 6-8 gi?Ladell Heads v? b? kh th?. Qu v? c cc tri?u ch?ng tr?m tr?ng h?n khi ???c ?i?u tr?Marland Kitchen Nh?ng tri?u ch?ng ny c th? l tr??ng h?p c?p c?u. Yu c?u tr? gip ngay l?p t?c. Hy g?i 911. Khng ch? xem tri?u ch?ng c h?t khng. Khng t? li xe ??n b?nh vi?n. Thng tin ny khng nh?m m?c ?ch thay th? cho l?i khuyn  m chuyn gia ch?m [place] s?c kh?e ni v?i qu v?. Hy b?o ??m qu v? ph?i th?o lu?n b?t k? v?n ?? g m qu v? c v?i chuyn gia ch?m [place] s?c kh?e c?a qu v?. Document Revised: 12/14/2021 Document Reviewed: 12/14/2021 Elsevier Patient Education  2024 [employer].

## 2023-01-11 NOTE — Telephone Encounter (Signed)
Phone call to patient.  States she received IV fluids today at [facility] and she is feeling ok.  She slept well last night after taking Tramadol and Mirtazapine before bedtime. She has not able to eat today due to nausea even after taking compazine.  She did drink Ensure twice so far today and took Miralax and dulcolax.[name] RN, Congregational Nurse [phone]

## 2023-01-15 ENCOUNTER — Other Ambulatory Visit (HOSPITAL_COMMUNITY): Payer: Self-pay

## 2023-01-16 ENCOUNTER — Other Ambulatory Visit (HOSPITAL_COMMUNITY): Payer: Self-pay

## 2023-01-16 DIAGNOSIS — H524 Presbyopia: Secondary | ICD-10-CM | POA: Diagnosis not present

## 2023-01-16 NOTE — Congregational Nurse Program (Signed)
Accompanied patient to [facility] to pick up glasses.  Home visit to deliver prescriptions which were picked up from [facility] out-patient pharmacy (gabapentin, lisinopril-hydrochlorothiazide, and xarelto). States that her appetite remains poor but she drinks Ensure and tries to stay hydrated.  [name] RN, Congregational Nurse [phone]

## 2023-01-17 ENCOUNTER — Other Ambulatory Visit (HOSPITAL_COMMUNITY): Payer: Self-pay

## 2023-01-22 ENCOUNTER — Other Ambulatory Visit (HOSPITAL_COMMUNITY): Payer: Self-pay

## 2023-01-24 NOTE — Congregational Nurse Program (Signed)
Home visit with interpreter [name].  Delivered Patient's Xeloda which was picked up yesterday from [facility] pharmacy.  Instructed her to begin taking on 01/29/2023 prior to appointment for IV chemo.  She expressed understanding.  She requested CN accompany her to appointment on 01/29/2023.  [name] RN, Congregational Nurse [phone]

## 2023-01-28 NOTE — Assessment & Plan Note (Signed)
-  Doppler on July 13, 2022 showed left acute DVT involving popliteal and peroneal vein, CTA chest was negative for PE. -continue Xarelto, tolerating well

## 2023-01-28 NOTE — Assessment & Plan Note (Signed)
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 -Despite strong recommendation, patient firmly and repeatedly declined IV chemo and surgery. She agreed to chemoRT with Xeloda, and received the treatment 01/04/21 - 02/14/21. -she developed local cancer progression and lung mets in 11/2021 -she started Xeloda on 02/16/2022, low dose oxaliplatin was added on 05/11/22 (with cycle 4 of Xeloda)  -chemo held since 07/2022 due to leg pain and recurrent nausea  -restaging CT 09/07/2022 showed stable disease in rectum and lung, no new lesions.   -she had chemo break from Feb-May 2024 -restart CAPOX with dose reduction 12/18/2022

## 2023-01-29 ENCOUNTER — Other Ambulatory Visit: Payer: Self-pay

## 2023-01-29 ENCOUNTER — Inpatient Hospital Stay: Payer: Medicaid Other

## 2023-01-29 ENCOUNTER — Inpatient Hospital Stay: Payer: Medicaid Other | Admitting: Hematology

## 2023-01-29 ENCOUNTER — Encounter: Payer: Self-pay | Admitting: Hematology

## 2023-01-29 ENCOUNTER — Inpatient Hospital Stay: Payer: Medicaid Other | Attending: Physician Assistant

## 2023-01-29 VITALS — BP 142/93 | HR 72 | Temp 98.3°F | Resp 16 | Ht <= 58 in | Wt 104.5 lb

## 2023-01-29 DIAGNOSIS — Z7901 Long term (current) use of anticoagulants: Secondary | ICD-10-CM | POA: Diagnosis not present

## 2023-01-29 DIAGNOSIS — Z5111 Encounter for antineoplastic chemotherapy: Secondary | ICD-10-CM | POA: Diagnosis present

## 2023-01-29 DIAGNOSIS — C7800 Secondary malignant neoplasm of unspecified lung: Secondary | ICD-10-CM | POA: Insufficient documentation

## 2023-01-29 DIAGNOSIS — C20 Malignant neoplasm of rectum: Secondary | ICD-10-CM | POA: Diagnosis not present

## 2023-01-29 DIAGNOSIS — D509 Iron deficiency anemia, unspecified: Secondary | ICD-10-CM

## 2023-01-29 DIAGNOSIS — Z86718 Personal history of other venous thrombosis and embolism: Secondary | ICD-10-CM | POA: Diagnosis not present

## 2023-01-29 DIAGNOSIS — I82432 Acute embolism and thrombosis of left popliteal vein: Secondary | ICD-10-CM

## 2023-01-29 LAB — CBC WITH DIFFERENTIAL (CANCER CENTER ONLY)
Abs Immature Granulocytes: 0.01 10*3/uL (ref 0.00–0.07)
Basophils Absolute: 0 10*3/uL (ref 0.0–0.1)
Basophils Relative: 1 %
Eosinophils Absolute: 0 10*3/uL (ref 0.0–0.5)
Eosinophils Relative: 0 %
HCT: 32.9 % — ABNORMAL LOW (ref 36.0–46.0)
Hemoglobin: 10.8 g/dL — ABNORMAL LOW (ref 12.0–15.0)
Immature Granulocytes: 0 %
Lymphocytes Relative: 24 %
Lymphs Abs: 0.9 10*3/uL (ref 0.7–4.0)
MCH: 23.9 pg — ABNORMAL LOW (ref 26.0–34.0)
MCHC: 32.8 g/dL (ref 30.0–36.0)
MCV: 72.9 fL — ABNORMAL LOW (ref 80.0–100.0)
Monocytes Absolute: 0.5 10*3/uL (ref 0.1–1.0)
Monocytes Relative: 14 %
Neutro Abs: 2.3 10*3/uL (ref 1.7–7.7)
Neutrophils Relative %: 61 %
Platelet Count: 272 10*3/uL (ref 150–400)
RBC: 4.51 MIL/uL (ref 3.87–5.11)
RDW: 18.8 % — ABNORMAL HIGH (ref 11.5–15.5)
WBC Count: 3.8 10*3/uL — ABNORMAL LOW (ref 4.0–10.5)
nRBC: 0 % (ref 0.0–0.2)

## 2023-01-29 LAB — CMP (CANCER CENTER ONLY)
ALT: <5 U/L (ref 0–44)
AST: 15 U/L (ref 15–41)
Albumin: 3.6 g/dL (ref 3.5–5.0)
Alkaline Phosphatase: 61 U/L (ref 38–126)
Anion gap: 7 (ref 5–15)
BUN: 13 mg/dL (ref 8–23)
CO2: 27 mmol/L (ref 22–32)
Calcium: 8.9 mg/dL (ref 8.9–10.3)
Chloride: 106 mmol/L (ref 98–111)
Creatinine: 0.7 mg/dL (ref 0.44–1.00)
GFR, Estimated: >60 mL/min (ref >60)
Glucose, Bld: 128 mg/dL — ABNORMAL HIGH (ref 70–99)
Potassium: 3.5 mmol/L (ref 3.5–5.1)
Sodium: 140 mmol/L (ref 135–145)
Total Bilirubin: 0.3 mg/dL (ref 0.3–1.2)
Total Protein: 7 g/dL (ref 6.5–8.1)

## 2023-01-29 LAB — TOTAL PROTEIN, URINE DIPSTICK: Protein, ur: NEGATIVE mg/dL

## 2023-01-29 LAB — FERRITIN: Ferritin: 26 ng/mL (ref 11–307)

## 2023-01-29 MED ORDER — SODIUM CHLORIDE 0.9 % IV SOLN
10.0000 mg | Freq: Once | INTRAVENOUS | Status: AC
  Administered 2023-01-29: 10 mg via INTRAVENOUS
  Filled 2023-01-29: qty 10

## 2023-01-29 MED ORDER — SODIUM CHLORIDE 0.9 % IV SOLN
10.0000 mg | Freq: Once | INTRAVENOUS | Status: DC
  Filled 2023-01-29: qty 1

## 2023-01-29 MED ORDER — FAMOTIDINE IN NACL 20-0.9 MG/50ML-% IV SOLN
20.0000 mg | Freq: Once | INTRAVENOUS | Status: AC
  Administered 2023-01-29: 20 mg via INTRAVENOUS
  Filled 2023-01-29: qty 50

## 2023-01-29 MED ORDER — OXALIPLATIN CHEMO INJECTION 100 MG/20ML
85.0000 mg/m2 | Freq: Once | INTRAVENOUS | Status: AC
  Administered 2023-01-29: 120 mg via INTRAVENOUS
  Filled 2023-01-29: qty 4

## 2023-01-29 MED ORDER — DEXTROSE 5 % IV SOLN: Freq: Once | INTRAVENOUS | Status: AC

## 2023-01-29 MED ORDER — SODIUM CHLORIDE 0.9 % IV SOLN
7.5000 mg/kg | Freq: Once | INTRAVENOUS | Status: AC
  Administered 2023-01-29: 350 mg via INTRAVENOUS
  Filled 2023-01-29: qty 14

## 2023-01-29 MED ORDER — PALONOSETRON HCL INJECTION 0.25 MG/5ML
0.2500 mg | Freq: Once | INTRAVENOUS | Status: AC
  Administered 2023-01-29: 0.25 mg via INTRAVENOUS
  Filled 2023-01-29: qty 5

## 2023-01-29 MED ORDER — DIPHENHYDRAMINE HCL 50 MG/ML IJ SOLN
25.0000 mg | Freq: Once | INTRAMUSCULAR | Status: AC
  Administered 2023-01-29: 25 mg via INTRAVENOUS
  Filled 2023-01-29: qty 1

## 2023-01-29 MED ORDER — SODIUM CHLORIDE 0.9 % IV SOLN: Freq: Once | INTRAVENOUS | Status: AC

## 2023-01-29 NOTE — Patient Instructions (Signed)
[place] [facility]  Discharge Instructions: Thank you for choosing [facility] to provide your oncology and hematology care.   If you have a lab appointment with the Cancer Center, please go directly to the Cancer Center and check in at the registration area.   Wear comfortable clothing and clothing appropriate for easy access to any Portacath or PICC line.   We strive to give you quality time with your provider. You may need to reschedule your appointment if you arrive late (15 or more minutes).  Arriving late affects you and other patients whose appointments are after yours.  Also, if you miss three or more appointments without notifying the office, you may be dismissed from the clinic at the provider's discretion.      For prescription refill requests, have your pharmacy contact our office and allow 72 hours for refills to be completed.    Today you received the following chemotherapy and/or immunotherapy agents: bevacizumab and oxaliplatin      To help prevent nausea and vomiting after your treatment, we encourage you to take your nausea medication as directed.  BELOW ARE SYMPTOMS THAT SHOULD BE REPORTED IMMEDIATELY: *FEVER GREATER THAN 100.4 F (38 C) OR HIGHER *CHILLS OR SWEATING *NAUSEA AND VOMITING THAT IS NOT CONTROLLED WITH YOUR NAUSEA MEDICATION *UNUSUAL SHORTNESS OF BREATH *UNUSUAL BRUISING OR BLEEDING *URINARY PROBLEMS (pain or burning when urinating, or frequent urination) *BOWEL PROBLEMS (unusual diarrhea, constipation, pain near the anus) TENDERNESS IN MOUTH AND THROAT WITH OR WITHOUT PRESENCE OF ULCERS (sore throat, sores in mouth, or a toothache) UNUSUAL RASH, SWELLING OR PAIN  UNUSUAL VAGINAL DISCHARGE OR ITCHING   Items with * indicate a potential emergency and should be followed up as soon as possible or go to the Emergency Department if any problems should occur.  Please show the CHEMOTHERAPY ALERT CARD or IMMUNOTHERAPY  ALERT CARD at check-in to the Emergency Department and triage nurse.  Should you have questions after your visit or need to cancel or reschedule your appointment, please contact [place] [facility]  Dept: [phone]  and follow the prompts.  Office hours are 8:00 a.m. to 4:30 p.m. Monday - Friday. Please note that voicemails left after 4:00 p.m. may not be returned until the following business day.  We are closed weekends and major holidays. You have access to a nurse at all times for urgent questions. Please call the main number to the clinic Dept: [phone] and follow the prompts.   For any non-urgent questions, you may also contact your provider using MyChart. We now offer e-Visits for anyone 65 and older to request care online for non-urgent symptoms. For details visit [email or web address].   Also download the MyChart app! Go to the app store, search "MyChart", open the app, select [place], and log in with your MyChart username and password.

## 2023-01-29 NOTE — Progress Notes (Signed)
Messaged Dr. Mosetta Putt and she would like for [place] to dose chemotherapy based on the patient's new weight   Demetrius Charity, PharmD

## 2023-01-29 NOTE — Progress Notes (Signed)
Nutrition Follow-up:  Patient with colorectal cancer.  Patient on capeox.  Met with patient during infusion.  Interpreter Luberta Robertson present during interview.  Reports that her appetite is poor, feels like foods are going to come back up in her throat at times.  Does not take nausea medications when this happens.  Has been some ensure.  Has not been eating meat except for fish because community members told her that other meats will cause cancer to spread.  Eats tick beans, vegetables, fruits, rice but small amounts    Medications: remeron, zofran, compazine  Labs: reviewed  Anthropometrics:   Weight 104 lb 8 oz today  107 lb 1.6 oz on 6/25 105 lb 8 oz on 6/7 112 lb on 5/6  NUTRITION DIAGNOSIS: Fpod and nutrition related knowledge deficit improved   INTERVENTION:  Encouraged patient to take nausea medication to help relieve symptom Continue ensure shakes for added nutrition Discussed evidence based recommendations regarding meat and cancer.       MONITORING, EVALUATION, GOAL: weight trends, intake   NEXT VISIT: Monday, August 5 during infusion  Jonathon Castelo B. Freida Busman, RD, LDN Registered Dietitian [phone]

## 2023-01-29 NOTE — Progress Notes (Signed)
[facility]   Telephone:(336) [phone] Fax:(336) [phone]   Clinic Follow up Note   Patient Care Team: [name], MD as PCP - General [name], RN (Inactive) as Oncology Nurse Navigator [name], NP as Nurse Practitioner (Nurse Practitioner) Malachy Mood, MD as Consulting Physician (Hematology and Oncology) Dorothy Puffer, MD as Consulting Physician (Radiation Oncology) [name], Netty Starring., MD as Consulting Physician (Gastroenterology) Josephine Igo, DO as Consulting Physician (Pulmonary Disease) Karie Soda, MD as Consulting Physician (General Surgery)  Date of Service:  01/29/2023  CHIEF COMPLAINT: f/u of colon and rectal cancer    CURRENT THERAPY: Restarted oxaliplatin and Xeloda (Capeox) on 12/18/22    ASSESSMENT:  Jillian Lutz is a 72 y.o. female with   Rectal adenocarcinoma (HCC) cT3cN1M0 stage IIIb, biopsy confirmed residual primary tumor and oligo lung met in 09/2021 -Initially diagnosed 11/2020 by colonoscopy for progressive rectal pain and bleeding, weight loss, and constipation. -Despite strong recommendation, patient firmly and repeatedly declined IV chemo and surgery. She agreed to chemoRT with Xeloda, and received the treatment 01/04/21 - 02/14/21. -she developed local cancer progression and lung mets in 11/2021 -she started Xeloda on 02/16/2022, low dose oxaliplatin was added on 05/11/22 (with cycle 4 of Xeloda)  -chemo held since 07/2022 due to leg pain and recurrent nausea  -restaging CT 09/07/2022 showed stable disease in rectum and lung, no new lesions.   -she had chemo break from Feb-May 2024 -restart CAPOX with dose reduction 12/18/2022, she tolerated first cycle overall well, her anorexia has a slightly improved last week. -She again asked about chemo break, I encouraged her to continue chemo for at least 3 months, we again discussed cancer related symptoms and chemo side effect, and the goal of chemotherapy which is palliative to  prolong her life by controlling her cancer.  Left leg DVT (HCC) -Doppler on July 13, 2022 showed left acute DVT involving popliteal and peroneal vein, CTA chest was negative for PE. -continue Xarelto, tolerating well       PLAN: -lab reviewed, will proceed with cycle 2 CapeOx today at the same dose -recommend getting a few more cycles before taking a chemo break. -repeat scan after 4 cycle. -order CT scan next visit. -lab/flush and treatment 8/5   SUMMARY OF ONCOLOGIC HISTORY: Oncology History Overview Note  Cancer Staging Rectal adenocarcinoma [facility]) Staging form: Colon and Rectum, AJCC 8th Edition - Clinical stage from 12/21/2020: Stage IIIB (cT3, cN1, cM0) - Unsigned    Rectal adenocarcinoma (HCC)  08/26/2020 Miscellaneous   Initial presentation to PCP, reporting intermittent BRBPR since 02/2020    12/01/2020 Procedure   Colonoscopy by Dr. Lavon Paganini findings - The perianal and digital rectal examinations were normal. - A 15 mm polyp was found in the ascending colon. The polyp was semi-pedunculated. Resection and retrieval were complete. - A 25 mm polyp was found in the sigmoid colon. The polyp was pedunculated. Resection and retrieval were complete. - An infiltrative partially obstructing large mass was found in the proximal rectum. The mass was partially circumferential (involving one-half of the lumen circumference). The mass measured eight cm in length extending from 10 to 18cm from anal verge. This was biopsied with a cold forceps for histology. Proximal and distal opposite fold area of the mass lesion was tattooed with an injection of total 3 mL of Spot (carbon black).   12/01/2020 Initial Biopsy   Diagnosis 1. Colon, polyp(s), ascending x 1 - ADENOCARCINOMA ARISING IN A TUBULAR ADENOMA WITH HIGH-GRADE DYSPLASIA. SEE  NOTE 2. Colon, sigmoid polyp, x1 - TUBULOVILLOUS ADENOMA(S) - NEGATIVE FOR HIGH-GRADE DYSPLASIA OR MALIGNANCY 3. Rectum, biopsy - ADENOCARCINOMA. SEE  NOTE   12/01/2020 Cancer Staging   Cancer Staging Rectal adenocarcinoma [facility]) Staging form: Colon and Rectum, AJCC 8th Edition - Clinical stage from 12/21/2020: Stage IIIB (cT3, cN1, cM0) - Unsigned    12/06/2020 Imaging   CT CAP with contrast IMPRESSION: 1. There is partially circumferential soft tissue thickening of the mid to superior rectum, approximately 5 cm in length and the inferior extent approximately 6 cm above the anal verge, consistent with primary rectal malignancy identified by colonoscopy. 2. There appear to be abnormally enlarged perirectal lymph nodes posteriorly about the superior rectum measuring up to 1.0 x 0.8 cm. Findings are suspicious for perirectal nodal metastatic disease, however rectal MRI is the test of choice for initial local staging of rectal cancer. 3. There is a 4 mm nonspecific pulmonary nodule of the superior segment left lower lobe, statistically most likely incidental, infectious or inflammatory, although nonspecific and isolated metastatic disease is not strictly excluded. Attention on follow-up. 4. No other evidence of metastatic disease in the chest, abdomen, or pelvis. Aortic Atherosclerosis (ICD10-I70.0).   12/09/2020 Imaging   Local staging MRI pelvis without contrast IMPRESSION: 4.9 cm circumferential mid/lower rectal mass, corresponding to the patient's newly diagnosed rectal cancer. Rectal adenocarcinoma T stage: T3c Rectal adenocarcinoma N stage:  N1 Distance from tumor to the internal anal sphincter is 3.7 cm.   12/21/2020 Initial Diagnosis   Rectal adenocarcinoma (HCC)   01/04/2021 -  Chemotherapy   Concurrent chemoradiation with Xeloda 1000mg  in the AM and 1500mg  in the PM on days of Radiation starting 01/04/21.       01/04/2021 - 02/11/2021 Radiation Therapy   Concurrent chemoradiation with Dr Mitzi Hansen and Xeloda starting 01/04/21.    01/31/2022 Procedure   Colonoscopy, Dr. Lavon Paganini  Findings: - An infiltrative partially  obstructing large mass was found in the rectum. The mass was circumferential. The mass measured ten cm in length, extending from 5-15cm from anal verge. In addition, rectal diameter at narrowest approx twelve mm. Oozing was present. Biopsies were taken with a cold forceps for histology.  Impression: - Hemorrhoids found on perianal exam. - Likely malignant partially obstructing tumor in the rectum. Biopsied. - Non-bleeding external and internal hemorrhoids.   01/31/2022 Pathology Results   Diagnosis Rectum, biopsy INVASIVE MODERATELY DIFFERENTIATED ADENOCARCINOMA   05/11/2022 -  Chemotherapy   Patient is on Treatment Plan : COLORECTAL Xelox (Capeox)(130/850) q21d     09/07/2022 Imaging    IMPRESSION: 1. No significant change in circumferential wall thickening of the rectum, in keeping with known primary rectal mass. Similar appearance of post treatment perirectal and presacral fat stranding and fascial thickening 2. Unchanged nodule of the superior segment left lower lobe with adjacent bandlike scarring. No new nodules. 3. No evidence of lymphadenopathy or other metastatic disease in the chest, abdomen, or pelvis. 4. Large burden of stool throughout the colon. 5. Cardiomegaly.   12/08/2022 Imaging    IMPRESSION: Persistent wall thickening along the rectum with adjacent severe inflammatory stranding and nodularity. Please correlate with exact location of the distribution of the patient's neoplasm.   No discrete new mass lesion, fluid collection or lymph node enlargement.   Improved visualization of the fractures involving the left side of the pubic symphysis as well as probable insufficiency fractures along the sacrum. Associated areas of increasing heterogeneous bony sclerosis along the pubic bones and sacrum. In principle sclerotic bone  metastases would be in the differential but are felt to be less likely based on the overall appearance. If needed confirmatory MRI can be  considered as clinically appropriate to further delineate.   Stable scarring and fibrotic changes along the lungs with a tiny nodular area along the superior segment of the left lower lobe. Simple continued follow up.   Enlarged heart.      INTERVAL HISTORY:  Jillian Lutz is here for a follow up of colon and rectal cancer . She was last seen by me on 01/09/2023. She presents to the clinic with interpreter. Pt state that she feels a little better.Pt state that after last chemo, she lost her appetite and feels fatigue. Pt state that she feels the same before starting chemo. Her symptom far as appetite it las about two weeks. She reports that it is slowly coming back.    All other systems were reviewed with the patient and are negative.  MEDICAL HISTORY:  Past Medical History:  Diagnosis Date   Bilateral wrist pain 10/25/2017   Hypertension    Left leg DVT (deep venous thrombosis) (HCC) 06/2022   Neck mass 1998   Unknown biopsy results. Excised in [place].   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   Rectal cancer (HCC)    Trigger finger, acquired 02/08/2017    SURGICAL HISTORY: Past Surgical History:  Procedure Laterality Date   BRONCHIAL BIOPSY  12/13/2021   Procedure: BRONCHIAL BIOPSIES;  Surgeon: Josephine Igo, DO;  Location: [facility];  Service: Pulmonary;;   BRONCHIAL NEEDLE ASPIRATION BIOPSY  12/13/2021   Procedure: BRONCHIAL NEEDLE ASPIRATION BIOPSIES;  Surgeon: Josephine Igo, DO;  Location: [facility]OS[facility];  Service: Pulmonary;;   NECK SURGERY Left 1998   Excision of mass in [place]   VIDEO BRONCHOSCOPY WITH RADIAL ENDOBRONCHIAL ULTRASOUND  12/13/2021   Procedure: RADIAL ENDOBRONCHIAL ULTRASOUND;  Surgeon: Josephine Igo, DO;  Location: [facility]OS[facility];  Service: Pulmonary;;    I have reviewed the social history and family history with the patient and they are unchanged from previous note.  ALLERGIES:  is allergic to no known  allergies.  MEDICATIONS:  Current Outpatient Medications  Medication Sig Dispense Refill   amLODipine (NORVASC) 5 MG tablet Take 1 tablet (5 mg total) by mouth daily. 90 tablet 3   brimonidine (ALPHAGAN) 0.2 % ophthalmic solution Place 1 drop into the right eye 2 (two) times daily. 15 mL 3   brimonidine (ALPHAGAN) 0.2 % ophthalmic solution Place 1 drop into the right eye 2 (two) times daily. 10 mL 11   capecitabine (XELODA) 500 MG tablet Take 2 tabs every 12 hours, for 14 days then off for 7 days. Take after meal 56 tablet 2   dorzolamide-timolol (COSOPT) 2-0.5 % ophthalmic solution Place 1 drop into both eyes 2 times daily. 30 mL 3   dorzolamide-timolol (COSOPT) 2-0.5 % ophthalmic solution Instill 1 drop into both eyes twice a day 10 mL 11   gabapentin (NEURONTIN) 100 MG capsule Take 1 capsule (100 mg total) by mouth 3 (three) times daily as needed. 90 capsule 2   latanoprost (XALATAN) 0.005 % ophthalmic solution Instill 1 drop into both eyes every night at bedtime 7.5 mL 3   latanoprost (XALATAN) 0.005 % ophthalmic solution Place 1 drop into both eyes at bedtime. 7.5 mL 6   lidocaine (XYLOCAINE) 5 % ointment Apply to anal area daily as needed. 50 g 1   lisinopril-hydrochlorothiazide (ZESTORETIC) 20-12.5 MG tablet Take  2 tablets by mouth daily. 60 tablet 3   mirtazapine (REMERON) 30 MG tablet Take 1 tablet (30 mg total) by mouth at bedtime. 30 tablet 1   Multiple Vitamins-Minerals (MULTIVITAMIN WITH MINERALS) tablet Take 1 tablet by mouth daily. Gummy 50 mg     ondansetron (ZOFRAN) 8 MG tablet Take 1 tablet (8 mg total) by mouth every 8 (eight) hours as needed for nausea or vomiting. Take after 3 days of chemo 30 tablet 2   polyethylene glycol powder (GLYCOLAX/MIRALAX) 17 GM/SCOOP powder Take 1 Container by mouth once.     prochlorperazine (COMPAZINE) 10 MG tablet Take 1 tablet (10 mg total) by mouth every 6 (six) hours as needed. 30 tablet 2   rivaroxaban (XARELTO) 20 MG TABS tablet  Take 1 tablet (20 mg total) by mouth daily with supper. 30 tablet 2   traMADol (ULTRAM) 50 MG tablet Take 1 tablet (50 mg total) by mouth every 8 (eight) hours as needed. 60 tablet 0   urea (CARMOL) 10 % cream Apply topically 2 times daily as needed. 85 g 3   No current facility-administered medications for this visit.    PHYSICAL EXAMINATION: ECOG PERFORMANCE STATUS: 1 - Symptomatic but completely ambulatory  Vitals:   01/29/23 0938  BP: (!) 142/93  Pulse: 72  Resp: 16  Temp: 98.3 F (36.8 C)  SpO2: 98%   Wt Readings from Last 3 Encounters:  01/29/23 104 lb 8 oz (47.4 kg)  01/09/23 107 lb 1.6 oz (48.6 kg)  01/08/23 107 lb 9.6 oz (48.8 kg)     GENERAL:alert, no distress and comfortable SKIN: skin color normal, no rashes or significant lesions EYES: normal, Conjunctiva are pink and non-injected, sclera clear  NEURO: alert & oriented x 3 with fluent speech   LABORATORY DATA:  I have reviewed the data as listed    Latest Ref Rng & Units 01/29/2023    9:15 AM 01/09/2023    7:13 AM 12/18/2022    9:55 AM  CBC  WBC 4.0 - 10.5 K/uL 3.8  3.6  7.3   Hemoglobin 12.0 - 15.0 g/dL 16.1  09.6  04.5   Hematocrit 36.0 - 46.0 % 32.9  32.5  32.1   Platelets 150 - 400 K/uL 272  252  294         Latest Ref Rng & Units 01/09/2023    7:13 AM 12/18/2022    9:55 AM 11/20/2022   11:11 AM  CMP  Glucose 70 - 99 mg/dL 409  89  94   BUN 8 - 23 mg/dL 11  12  14    Creatinine 0.44 - 1.00 mg/dL 8.11  9.14  7.82   Sodium 135 - 145 mmol/L 141  138  137   Potassium 3.5 - 5.1 mmol/L 3.6  3.5  3.9   Chloride 98 - 111 mmol/L 106  103  103   CO2 22 - 32 mmol/L 28  29  28    Calcium 8.9 - 10.3 mg/dL 8.5  9.1  8.9   Total Protein 6.5 - 8.1 g/dL 6.8  7.2  7.4   Total Bilirubin 0.3 - 1.2 mg/dL 0.3  0.4  0.4   Alkaline Phos 38 - 126 U/L 64  74  82   AST 15 - 41 U/L 14  15  14    ALT 0 - 44 U/L <5  <5  <5       RADIOGRAPHIC STUDIES: I have personally reviewed the radiological images as listed and agreed  with the findings in the report. No results found.    Orders Placed This Encounter  Procedures   CBC with Differential (Cancer Center Only)    Standing Status:   Future    Standing Expiration Date:   03/11/2024   CMP (Cancer Center only)    Standing Status:   Future    Standing Expiration Date:   03/11/2024   CBC with Differential (Cancer Center Only)    Standing Status:   Future    Standing Expiration Date:   04/01/2024   CMP (Cancer Center only)    Standing Status:   Future    Standing Expiration Date:   04/01/2024   All questions were answered. The patient knows to call the clinic with any problems, questions or concerns. No barriers to learning was detected. The total time spent in the appointment was 30 minutes.     Malachy Mood, MD 01/29/2023   Carolin Coy, CMA, am acting as scribe for Malachy Mood, MD.   I have reviewed the above documentation for accuracy and completeness, and I agree with the above.

## 2023-01-29 NOTE — Congregational Nurse Program (Signed)
Accompanied patient to appointment with [facility]. She saw Dr. Mosetta Putt who reported that today's lab work looks good.  Patient's primary complaint is loss of appetite following chemo.  She started Xeloda 2 tablets twice a day this morning and agreed to proceed with today's scheduled IV chemo.  She will meet with nutritionist at 1:45 pm today.  [name] RN, Congregational Nurse [phone].

## 2023-01-30 ENCOUNTER — Other Ambulatory Visit: Payer: Self-pay

## 2023-02-07 ENCOUNTER — Other Ambulatory Visit (HOSPITAL_COMMUNITY): Payer: Self-pay

## 2023-02-07 NOTE — Congregational Nurse Program (Signed)
Home visit with interpreter [name].  Patient was pleasant in no acute distress. Stated she still has a poor appetite but has been drinking tea which seems to stimulate appetite.  She received letter from [facility] regarding PCP.  Phone call determined they have correct information and no other action needed.  At present her Medicaid does not have an expiration date. She is taking Xeloda as prescribed  and should finish in 4 days (02/10/2023).  Next appointment for chemo is 02/19/2023 and she will resume Xeloda then.  [name] RN, Congregational Nurse [phone]

## 2023-02-14 NOTE — Congregational Nurse Program (Signed)
Home visit with interpreter [name].  Delivered Xeloda which CN picked up yesterday at [facility].  She will start taking it 2 tablets BID on Monday 02/19/2023.  Reminded her of appointment on [facility] with the [facility].  [name] RN, Congregational Nurse [phone]

## 2023-02-16 MED FILL — Dexamethasone Sodium Phosphate Inj 100 MG/10ML: INTRAMUSCULAR | Qty: 1 | Status: AC

## 2023-02-16 NOTE — Assessment & Plan Note (Addendum)
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 -Despite strong recommendation, patient firmly and repeatedly declined IV chemo and surgery. She agreed to chemoRT with Xeloda, and received the treatment 01/04/21 - 02/14/21. -she developed local cancer progression and lung mets in 11/2021 -she started Xeloda on 02/16/2022, low dose oxaliplatin was added on 05/11/22 (with cycle 4 of Xeloda)  -chemo held since 07/2022 due to leg pain and recurrent nausea  -restaging CT 09/07/2022 showed stable disease in rectum and lung, no new lesions.   -she had chemo break from Feb-May 2024 -restart CAPOX with dose reduction 12/18/2022 -01/27/2023 - cycle #3 with dose reduced Capeox -02/19/2023 - Cycle #4

## 2023-02-16 NOTE — Progress Notes (Unsigned)
Patient Care Team: [name], MD as PCP - General [name], RN (Inactive) as Oncology Nurse Navigator [name], NP as Nurse Practitioner (Nurse Practitioner) Malachy Mood, MD as Consulting Physician (Hematology and Oncology) Dorothy Puffer, MD as Consulting Physician (Radiation Oncology) [name], Netty Starring., MD as Consulting Physician (Gastroenterology) Josephine Igo, DO as Consulting Physician (Pulmonary Disease) Karie Soda, MD as Consulting Physician (General Surgery)  Clinic Day:  02/19/2023  Referring physician: Malachy Mood, MD  ASSESSMENT & PLAN:   Assessment & Plan: 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 -Despite strong recommendation, patient firmly and repeatedly declined IV chemo and surgery. She agreed to chemoRT with Xeloda, and received the treatment 01/04/21 - 02/14/21. -she developed local cancer progression and lung mets in 11/2021 -she started Xeloda on 02/16/2022, low dose oxaliplatin was added on 05/11/22 (with cycle 4 of Xeloda)  -chemo held since 07/2022 due to leg pain and recurrent nausea  -restaging CT 09/07/2022 showed stable disease in rectum and lung, no new lesions.   -she had chemo break from Feb-May 2024 -restart CAPOX with dose reduction 12/18/2022 -01/27/2023 - cycle #3 with dose reduced Capeox -02/19/2023 - Cycle #4   Weight loss Her weight is down 3 pounds in the last 4 weeks. She continues to have poor appetite. Her appetite makes it difficult for her to drink protein shakes consistently. Her mirtazapine was increased during her last office visit.  -Follow up with palliative care  Rectal or anal pain -Refill tramadol 50 mg every 8 hours as needed for pain -recommend she add back daily dosing miralax to reduce constipation, thus improving  rectal pain.  -continue stool softener/gentle laxative as prescribed.    Poor appetite Poor appetite since starting Capeox -will hold today's chemotherapy due to severity of appetite and weight loss. -reassess at next visit. to be held today. Reassess ability to receive treatment again on 03/13/2023.  -repeat CBC/diff and CMP 03/13/2023 with follow up -repeat CT chest, abdomen, and pelvis prior to appointment 04/02/2023.     The patient understands the plans discussed today and is in agreement with them.  She knows to contact our office if she develops concerns prior to her next appointment.  I provided 40 minutes of face-to-face time during this encounter and > 50% was spent counseling as documented under my assessment and plan.    Carlean Jews, NP  [facility] [address] [place] [address] Dept: [phone] Dept Fax: [phone]   Orders Placed This Encounter  Procedures   CT CHEST W CONTRAST    Standing Status:   Future    Standing Expiration Date:   02/19/2024    Order Specific Question:   If indicated for the ordered procedure, I authorize the administration of contrast media per Radiology protocol    Answer:   Yes    Order Specific Question:   Does the patient have a contrast media/X-ray dye allergy?    Answer:   No    Order Specific Question:   Preferred imaging location?    Answer:   [facility]   CT ABDOMEN PELVIS W CONTRAST    Standing Status:   Future    Standing Expiration Date:   02/19/2024    Order Specific Question:   If indicated for  the ordered procedure, I authorize the administration of contrast media per Radiology protocol    Answer:   Yes    Order Specific Question:   Does the patient have a contrast media/X-ray dye allergy?    Answer:   No    Order Specific Question:   Preferred imaging location?    Answer:   [facility]    Order Specific Question:   If  indicated for the ordered procedure, I authorize the administration of oral contrast media per Radiology protocol    Answer:   Yes      CHIEF COMPLAINT:  CC: rectal adenocarcinoma   Current Treatment:  Xeloda 1000 mg twice daily for 14 days then 7 days off. CapeOx at reduced dose every 3 weeks   INTERVAL HISTORY:  Jillian Lutz is here today for repeat clinical assessment. She denies fevers or chills. She denies pain. Her appetite is good. Her weight has decreased 3 pounds over last 30 days   Rectal Adenocarcinoma Jillian Lutz 72 y.o. female returns to the clinic today for a follow-up visit accompanied by her interpretor and nurse.  The patient has rectal cancer.  The patient was hesitant to start systemic chemotherapy initially, but she eventually started treatment in 2023.  She was previously undergoing treatment with Xeloda and low-dose oxaliplatin.  However, the patient has significant intolerance to chemotherapy and she had a chemo break for several months.    Unfortunately, she had a restaging CT scan on 12/08/2022 which showed severe inflammation and stranding and nodularity in the rectal area which is worse compared to February 2023 without any other signs of metastatic disease.  She is also having persistent cancer related symptoms including pain, constipation, fatigue, Dr. Mosetta Putt recommended that she restarted CAPOX since she responded well previously.   She restarted this on 12/18/2022. She called in the interval on 12/20/22 with the concerns of fatigue, poor appetite, epigastric discomfort/burning, lower abdominal discomfort, and constipation.   Today, 02/19/2023, patient is asking to hold treatment. She feels fatigued. She had decreased appetite with nausea and vomiting. She states that she has been able to eat, only the last few days. She has had a 4 pound weight loss since her last visit. She denies diarrhea, but is having constipation. Reports bowel movements every 2 to 3 days. She reports rectal  burning with bowel movements. She is using Biscodyl but not using miralax. She denies abdominal pain or discomfort. She states that she can take tramadol as needed for rectal pain which does help. She is asking for refill for this today.    In addition to remeron for insomnia, she also takes melatonin.   Plan: Treatment with Oxalyplatin to be held today. Fluids given today Will reassess ability to receive treatment on 03/13/2023.  Labs/flush, follow up, and possible treatment on 03/13/2023 Repeat CT Chest, Abdomen, and Pelvis in 5-6 weeks.  Labs/flush, follow up on 04/02/2023    I have reviewed the past medical history, past surgical history, social history and family history with the patient and they are unchanged from previous note.  ALLERGIES:  is allergic to no known allergies.  MEDICATIONS:  Current Outpatient Medications  Medication Sig Dispense Refill   amLODipine (NORVASC) 5 MG tablet Take 1 tablet (5 mg total) by mouth daily. 90 tablet 3   brimonidine (ALPHAGAN) 0.2 % ophthalmic solution Place 1 drop into the right eye 2 (two) times daily. 15 mL 3   brimonidine (ALPHAGAN) 0.2 % ophthalmic solution Place 1 drop into  the right eye 2 (two) times daily. 10 mL 11   capecitabine (XELODA) 500 MG tablet Take 2 tabs every 12 hours, for 14 days then off for 7 days. Take after meal 56 tablet 2   dorzolamide-timolol (COSOPT) 2-0.5 % ophthalmic solution Place 1 drop into both eyes 2 times daily. 30 mL 3   dorzolamide-timolol (COSOPT) 2-0.5 % ophthalmic solution Instill 1 drop into both eyes twice a day 10 mL 11   gabapentin (NEURONTIN) 100 MG capsule Take 1 capsule (100 mg total) by mouth 3 (three) times daily as needed. 90 capsule 2   latanoprost (XALATAN) 0.005 % ophthalmic solution Instill 1 drop into both eyes every night at bedtime 7.5 mL 3   latanoprost (XALATAN) 0.005 % ophthalmic solution Place 1 drop into both eyes at bedtime. 7.5 mL 6   lidocaine (XYLOCAINE) 5 % ointment Apply  to anal area daily as needed. 50 g 1   lisinopril-hydrochlorothiazide (ZESTORETIC) 20-12.5 MG tablet Take 2 tablets by mouth daily. 60 tablet 3   mirtazapine (REMERON) 30 MG tablet Take 1 tablet (30 mg total) by mouth at bedtime. 30 tablet 1   Multiple Vitamins-Minerals (MULTIVITAMIN WITH MINERALS) tablet Take 1 tablet by mouth daily. Gummy 50 mg     ondansetron (ZOFRAN) 8 MG tablet Take 1 tablet (8 mg total) by mouth every 8 (eight) hours as needed for nausea or vomiting. Take after 3 days of chemo 30 tablet 2   polyethylene glycol powder (GLYCOLAX/MIRALAX) 17 GM/SCOOP powder Take 1 Container by mouth once.     prochlorperazine (COMPAZINE) 10 MG tablet Take 1 tablet (10 mg total) by mouth every 6 (six) hours as needed. 30 tablet 2   rivaroxaban (XARELTO) 20 MG TABS tablet Take 1 tablet (20 mg total) by mouth daily with supper. 30 tablet 2   urea (CARMOL) 10 % cream Apply topically 2 times daily as needed. 85 g 3   traMADol (ULTRAM) 50 MG tablet Take 1 tablet (50 mg total) by mouth every 8 (eight) hours as needed. 60 tablet 0   No current facility-administered medications for this visit.   Facility-Administered Medications Ordered in Other Visits  Medication Dose Route Frequency Provider Last Rate Last Admin   heparin lock flush 100 unit/mL  500 Units Intracatheter Once PRN Malachy Mood, MD       sodium chloride flush (NS) 0.9 % injection 10 mL  10 mL Intracatheter PRN Malachy Mood, MD        HISTORY OF PRESENT ILLNESS:   Oncology History Overview Note  Cancer Staging Rectal adenocarcinoma [facility]) Staging form: Colon and Rectum, AJCC 8th Edition - Clinical stage from 12/21/2020: Stage IIIB (cT3, cN1, cM0) - Unsigned    Rectal adenocarcinoma (HCC)  08/26/2020 Miscellaneous   Initial presentation to PCP, reporting intermittent BRBPR since 02/2020    12/01/2020 Procedure   Colonoscopy by Dr. Lavon Paganini findings - The perianal and digital rectal examinations were normal. - A 15 mm polyp was found in  the ascending colon. The polyp was semi-pedunculated. Resection and retrieval were complete. - A 25 mm polyp was found in the sigmoid colon. The polyp was pedunculated. Resection and retrieval were complete. - An infiltrative partially obstructing large mass was found in the proximal rectum. The mass was partially circumferential (involving one-half of the lumen circumference). The mass measured eight cm in length extending from 10 to 18cm from anal verge. This was biopsied with a cold forceps for histology. Proximal and distal opposite fold area of the  mass lesion was tattooed with an injection of total 3 mL of Spot (carbon black).   12/01/2020 Initial Biopsy   Diagnosis 1. Colon, polyp(s), ascending x 1 - ADENOCARCINOMA ARISING IN A TUBULAR ADENOMA WITH HIGH-GRADE DYSPLASIA. SEE NOTE 2. Colon, sigmoid polyp, x1 - TUBULOVILLOUS ADENOMA(S) - NEGATIVE FOR HIGH-GRADE DYSPLASIA OR MALIGNANCY 3. Rectum, biopsy - ADENOCARCINOMA. SEE NOTE   12/01/2020 Cancer Staging   Cancer Staging Rectal adenocarcinoma [facility]) Staging form: Colon and Rectum, AJCC 8th Edition - Clinical stage from 12/21/2020: Stage IIIB (cT3, cN1, cM0) - Unsigned    12/06/2020 Imaging   CT CAP with contrast IMPRESSION: 1. There is partially circumferential soft tissue thickening of the mid to superior rectum, approximately 5 cm in length and the inferior extent approximately 6 cm above the anal verge, consistent with primary rectal malignancy identified by colonoscopy. 2. There appear to be abnormally enlarged perirectal lymph nodes posteriorly about the superior rectum measuring up to 1.0 x 0.8 cm. Findings are suspicious for perirectal nodal metastatic disease, however rectal MRI is the test of choice for initial local staging of rectal cancer. 3. There is a 4 mm nonspecific pulmonary nodule of the superior segment left lower lobe, statistically most likely incidental, infectious or inflammatory, although nonspecific and  isolated metastatic disease is not strictly excluded. Attention on follow-up. 4. No other evidence of metastatic disease in the chest, abdomen, or pelvis. Aortic Atherosclerosis (ICD10-I70.0).   12/09/2020 Imaging   Local staging MRI pelvis without contrast IMPRESSION: 4.9 cm circumferential mid/lower rectal mass, corresponding to the patient's newly diagnosed rectal cancer. Rectal adenocarcinoma T stage: T3c Rectal adenocarcinoma N stage:  N1 Distance from tumor to the internal anal sphincter is 3.7 cm.   12/21/2020 Initial Diagnosis   Rectal adenocarcinoma (HCC)   01/04/2021 -  Chemotherapy   Concurrent chemoradiation with Xeloda 1000mg  in the AM and 1500mg  in the PM on days of Radiation starting 01/04/21.       01/04/2021 - 02/11/2021 Radiation Therapy   Concurrent chemoradiation with Dr Mitzi Hansen and Xeloda starting 01/04/21.    01/31/2022 Procedure   Colonoscopy, Dr. Lavon Paganini  Findings: - An infiltrative partially obstructing large mass was found in the rectum. The mass was circumferential. The mass measured ten cm in length, extending from 5-15cm from anal verge. In addition, rectal diameter at narrowest approx twelve mm. Oozing was present. Biopsies were taken with a cold forceps for histology.  Impression: - Hemorrhoids found on perianal exam. - Likely malignant partially obstructing tumor in the rectum. Biopsied. - Non-bleeding external and internal hemorrhoids.   01/31/2022 Pathology Results   Diagnosis Rectum, biopsy INVASIVE MODERATELY DIFFERENTIATED ADENOCARCINOMA   05/11/2022 -  Chemotherapy   Patient is on Treatment Plan : COLORECTAL Xelox (Capeox)(130/850) q21d     09/07/2022 Imaging    IMPRESSION: 1. No significant change in circumferential wall thickening of the rectum, in keeping with known primary rectal mass. Similar appearance of post treatment perirectal and presacral fat stranding and fascial thickening 2. Unchanged nodule of the superior segment left lower  lobe with adjacent bandlike scarring. No new nodules. 3. No evidence of lymphadenopathy or other metastatic disease in the chest, abdomen, or pelvis. 4. Large burden of stool throughout the colon. 5. Cardiomegaly.   12/08/2022 Imaging    IMPRESSION: Persistent wall thickening along the rectum with adjacent severe inflammatory stranding and nodularity. Please correlate with exact location of the distribution of the patient's neoplasm.   No discrete new mass lesion, fluid collection or lymph node enlargement.  Improved visualization of the fractures involving the left side of the pubic symphysis as well as probable insufficiency fractures along the sacrum. Associated areas of increasing heterogeneous bony sclerosis along the pubic bones and sacrum. In principle sclerotic bone metastases would be in the differential but are felt to be less likely based on the overall appearance. If needed confirmatory MRI can be considered as clinically appropriate to further delineate.   Stable scarring and fibrotic changes along the lungs with a tiny nodular area along the superior segment of the left lower lobe. Simple continued follow up.   Enlarged heart.       REVIEW OF SYSTEMS:   Constitutional: Denies fevers or chills. Has had decreased appetite with 3 pound weight loss since most recent visit . Eyes: Denies blurriness of vision Ears, nose, mouth, throat, and face: Denies mucositis or sore throat Respiratory: Denies cough, dyspnea or wheezes Cardiovascular: Denies palpitation, chest discomfort or lower extremity swelling Gastrointestinal:  Denies nausea, heartburn or change in bowel habits. She is having bowel movements every 2 to 3 days. Has rectal burning when she has a bowel movement.  Skin: Denies abnormal skin rashes Lymphatics: Denies new lymphadenopathy or easy bruising Neurological:Denies numbness, tingling or new weaknesses Behavioral/Psych: Mood is stable, no new changes   All other systems were reviewed with the patient and are negative.   VITALS:   Today's Vitals   02/19/23 1047  BP: (!) 142/88  Pulse: 70  Resp: 16  Temp: 98.6 F (37 C)  TempSrc: Oral  SpO2: 99%  Weight: 101 lb 6.4 oz (46 kg)  Height: 4\' 10"  (1.473 m)   Body mass index is 21.19 kg/m.   Wt Readings from Last 3 Encounters:  02/19/23 101 lb 6.4 oz (46 kg)  01/29/23 104 lb 8 oz (47.4 kg)  01/09/23 107 lb 1.6 oz (48.6 kg)    Body mass index is 21.19 kg/m.  Performance status (ECOG): 1 - Symptomatic but completely ambulatory  PHYSICAL EXAM:   GENERAL:alert, no distress and comfortable SKIN: skin color, texture, turgor are normal, no rashes or significant lesions EYES: normal, Conjunctiva are pink and non-injected, sclera clear OROPHARYNX:no exudate, no erythema and lips, buccal mucosa, and tongue normal  NECK: supple, thyroid normal size, non-tender, without nodularity LYMPH:  no palpable lymphadenopathy in the cervical, axillary or inguinal LUNGS: clear to auscultation and percussion with normal breathing effort HEART: regular rate & rhythm and no murmurs and no lower extremity edema ABDOMEN:abdomen soft, non-tender and normal bowel sounds Musculoskeletal:no cyanosis of digits and no clubbing  NEURO: alert & oriented x 3 with fluent speech, no focal motor/sensory deficits  LABORATORY DATA:  I have reviewed the data as listed    Component Value Date/Time   NA 139 02/19/2023 1030   NA 139 09/29/2019 1406   K 4.0 02/19/2023 1030   CL 105 02/19/2023 1030   CO2 28 02/19/2023 1030   GLUCOSE 134 (H) 02/19/2023 1030   BUN 11 02/19/2023 1030   BUN 12 09/29/2019 1406   CREATININE 0.72 02/19/2023 1030   CALCIUM 8.9 02/19/2023 1030   PROT 6.8 02/19/2023 1030   PROT 6.6 06/22/2016 1004   ALBUMIN 3.5 02/19/2023 1030   ALBUMIN 4.1 06/22/2016 1004   AST 16 02/19/2023 1030   ALT <5 02/19/2023 1030   ALKPHOS 64 02/19/2023 1030   BILITOT 0.4 02/19/2023 1030   GFRNONAA >60  02/19/2023 1030   GFRAA 88 09/29/2019 1406     Lab Results  Component Value Date  WBC 3.8 (L) 02/19/2023   NEUTROABS 2.4 02/19/2023   HGB 10.6 (L) 02/19/2023   HCT 32.7 (L) 02/19/2023   MCV 73.8 (L) 02/19/2023   PLT 291 02/19/2023      Chemistry      Component Value Date/Time   NA 139 02/19/2023 1030   NA 139 09/29/2019 1406   K 4.0 02/19/2023 1030   CL 105 02/19/2023 1030   CO2 28 02/19/2023 1030   BUN 11 02/19/2023 1030   BUN 12 09/29/2019 1406   CREATININE 0.72 02/19/2023 1030      Component Value Date/Time   CALCIUM 8.9 02/19/2023 1030   ALKPHOS 64 02/19/2023 1030   AST 16 02/19/2023 1030   ALT <5 02/19/2023 1030   BILITOT 0.4 02/19/2023 1030     Plan: Treatment with Oxalyplatin to be held today. Fluids given today Will reassess ability to receive treatment on 03/13/2023.  Labs/flush, follow up, and possible treatment on 03/13/2023 Repeat CT Chest, Abdomen, and Pelvis in 5-6 weeks.  Labs/flush, follow up on 04/02/2023  Addendum   I have seen the patient, examined her. I agree with the assessment and and plan and have edited the notes.   Patient complains of worsening fatigue, lack of chemo, and requests to hold oxaliplatin today.  She is agreeable with bevacizumab and capecitabine.  Labs reviewed, adequate for treatment, will proceed.  If she does well with this cycle, she is agreeable to resume CapeOx on next visit in 3 weeks, plan to repeat a restaging CT scan in 5 to 6 weeks, all questions were answered.   Malachy Mood MD 02/19/2023

## 2023-02-19 ENCOUNTER — Inpatient Hospital Stay: Payer: Medicaid Other | Attending: Physician Assistant

## 2023-02-19 ENCOUNTER — Inpatient Hospital Stay (HOSPITAL_BASED_OUTPATIENT_CLINIC_OR_DEPARTMENT_OTHER): Payer: Medicaid Other | Admitting: Nurse Practitioner

## 2023-02-19 ENCOUNTER — Inpatient Hospital Stay: Payer: Medicaid Other

## 2023-02-19 ENCOUNTER — Other Ambulatory Visit: Payer: Self-pay

## 2023-02-19 ENCOUNTER — Other Ambulatory Visit (HOSPITAL_COMMUNITY): Payer: Self-pay

## 2023-02-19 VITALS — BP 138/68 | HR 63 | Resp 18

## 2023-02-19 VITALS — BP 142/88 | HR 70 | Temp 98.6°F | Resp 16 | Ht <= 58 in | Wt 101.4 lb

## 2023-02-19 DIAGNOSIS — R630 Anorexia: Secondary | ICD-10-CM

## 2023-02-19 DIAGNOSIS — K6289 Other specified diseases of anus and rectum: Secondary | ICD-10-CM

## 2023-02-19 DIAGNOSIS — R634 Abnormal weight loss: Secondary | ICD-10-CM

## 2023-02-19 DIAGNOSIS — C20 Malignant neoplasm of rectum: Secondary | ICD-10-CM

## 2023-02-19 DIAGNOSIS — Z7901 Long term (current) use of anticoagulants: Secondary | ICD-10-CM | POA: Insufficient documentation

## 2023-02-19 DIAGNOSIS — Z86718 Personal history of other venous thrombosis and embolism: Secondary | ICD-10-CM | POA: Insufficient documentation

## 2023-02-19 DIAGNOSIS — C7800 Secondary malignant neoplasm of unspecified lung: Secondary | ICD-10-CM | POA: Insufficient documentation

## 2023-02-19 DIAGNOSIS — Z5111 Encounter for antineoplastic chemotherapy: Secondary | ICD-10-CM | POA: Insufficient documentation

## 2023-02-19 LAB — CBC WITH DIFFERENTIAL (CANCER CENTER ONLY)
Abs Immature Granulocytes: 0 10*3/uL (ref 0.00–0.07)
Basophils Absolute: 0 10*3/uL (ref 0.0–0.1)
Basophils Relative: 1 %
Eosinophils Absolute: 0.1 10*3/uL (ref 0.0–0.5)
Eosinophils Relative: 2 %
HCT: 32.7 % — ABNORMAL LOW (ref 36.0–46.0)
Hemoglobin: 10.6 g/dL — ABNORMAL LOW (ref 12.0–15.0)
Immature Granulocytes: 0 %
Lymphocytes Relative: 21 %
Lymphs Abs: 0.8 10*3/uL (ref 0.7–4.0)
MCH: 23.9 pg — ABNORMAL LOW (ref 26.0–34.0)
MCHC: 32.4 g/dL (ref 30.0–36.0)
MCV: 73.8 fL — ABNORMAL LOW (ref 80.0–100.0)
Monocytes Absolute: 0.5 10*3/uL (ref 0.1–1.0)
Monocytes Relative: 13 %
Neutro Abs: 2.4 10*3/uL (ref 1.7–7.7)
Neutrophils Relative %: 63 %
Platelet Count: 291 10*3/uL (ref 150–400)
RBC: 4.43 MIL/uL (ref 3.87–5.11)
RDW: 19.6 % — ABNORMAL HIGH (ref 11.5–15.5)
WBC Count: 3.8 10*3/uL — ABNORMAL LOW (ref 4.0–10.5)
nRBC: 0 % (ref 0.0–0.2)

## 2023-02-19 LAB — CMP (CANCER CENTER ONLY)
ALT: <5 U/L (ref 0–44)
AST: 16 U/L (ref 15–41)
Albumin: 3.5 g/dL (ref 3.5–5.0)
Alkaline Phosphatase: 64 U/L (ref 38–126)
Anion gap: 6 (ref 5–15)
BUN: 11 mg/dL (ref 8–23)
CO2: 28 mmol/L (ref 22–32)
Calcium: 8.9 mg/dL (ref 8.9–10.3)
Chloride: 105 mmol/L (ref 98–111)
Creatinine: 0.72 mg/dL (ref 0.44–1.00)
GFR, Estimated: >60 mL/min (ref >60)
Glucose, Bld: 134 mg/dL — ABNORMAL HIGH (ref 70–99)
Potassium: 4 mmol/L (ref 3.5–5.1)
Sodium: 139 mmol/L (ref 135–145)
Total Bilirubin: 0.4 mg/dL (ref 0.3–1.2)
Total Protein: 6.8 g/dL (ref 6.5–8.1)

## 2023-02-19 LAB — TOTAL PROTEIN, URINE DIPSTICK: Protein, ur: NEGATIVE mg/dL

## 2023-02-19 MED ORDER — HEPARIN SOD (PORK) LOCK FLUSH 100 UNIT/ML IV SOLN: 500.0000 [IU] | Freq: Once | INTRAVENOUS | Status: DC | PRN

## 2023-02-19 MED ORDER — TRAMADOL HCL 50 MG PO TABS
50.0000 mg | ORAL_TABLET | Freq: Three times a day (TID) | ORAL | 0 refills | Status: DC | PRN
Start: 2023-02-19 — End: 2023-03-13
  Filled 2023-02-19: qty 60, 20d supply, fill #0

## 2023-02-19 MED ORDER — SODIUM CHLORIDE 0.9 % IV SOLN: Freq: Once | INTRAVENOUS | Status: AC

## 2023-02-19 MED ORDER — SODIUM CHLORIDE 0.9 % IV SOLN
7.5000 mg/kg | Freq: Once | INTRAVENOUS | Status: AC
  Administered 2023-02-19: 350 mg via INTRAVENOUS
  Filled 2023-02-19: qty 14

## 2023-02-19 MED ORDER — SODIUM CHLORIDE 0.9% FLUSH: 10.0000 mL | INTRAVENOUS | Status: DC | PRN

## 2023-02-19 NOTE — Progress Notes (Signed)
Patient seen by Dr. America Brown are within treatment parameters.  Labs reviewed: and are within treatment parameters.  Per physician team,  Hold oxaliplatin ok to treat per Dr. Mosetta Putt. Treatment plan updated. Also giving IV fluids today.

## 2023-02-19 NOTE — Assessment & Plan Note (Signed)
Her weight is down 3 pounds in the last 4 weeks. She continues to have poor appetite. Her appetite makes it difficult for her to drink protein shakes consistently. Her mirtazapine was increased during her last office visit.  -Follow up with palliative care

## 2023-02-19 NOTE — Congregational Nurse Program (Signed)
Accompanied patient to appointment at [facility].  Patient resumed oral chemo this am taking Xeloda 2 tablets BID.  Continues to complain of lethargy and loss of appetite with nausea and vomiting. She had 3 pound weight loss since last treatment 3 weeks ago and requested that today's IV chemo be held.  She did receive IV fluid and vegzelma today.  She agreed to have C-T scan done this month and at present plans to receive IV chemo on next scheduled visit in 3 weeks.  [name] RN, Congregational Nurse [phone]

## 2023-02-19 NOTE — Patient Instructions (Signed)
[facility]  Discharge Instructions: Thank you for choosing [facility] to provide your oncology and hematology care.   If you have a lab appointment with the [facility], please go directly to the [facility] and check in at the registration area.   Wear comfortable clothing and clothing appropriate for easy access to any Portacath or PICC line.   We strive to give you quality time with your provider. You may need to reschedule your appointment if you arrive late (15 or more minutes).  Arriving late affects you and other patients whose appointments are after yours.  Also, if you miss three or more appointments without notifying the office, you may be dismissed from the clinic at the provider's discretion.      For prescription refill requests, have your pharmacy contact our office and allow 72 hours for refills to be completed.    Today you received the following chemotherapy and/or immunotherapy agents: Bevacizumab      To help prevent nausea and vomiting after your treatment, we encourage you to take your nausea medication as directed.  BELOW ARE SYMPTOMS THAT SHOULD BE REPORTED IMMEDIATELY: *FEVER GREATER THAN 100.4 F (38 C) OR HIGHER *CHILLS OR SWEATING *NAUSEA AND VOMITING THAT IS NOT CONTROLLED WITH YOUR NAUSEA MEDICATION *UNUSUAL SHORTNESS OF BREATH *UNUSUAL BRUISING OR BLEEDING *URINARY PROBLEMS (pain or burning when urinating, or frequent urination) *BOWEL PROBLEMS (unusual diarrhea, constipation, pain near the anus) TENDERNESS IN MOUTH AND THROAT WITH OR WITHOUT PRESENCE OF ULCERS (sore throat, sores in mouth, or a toothache) UNUSUAL RASH, SWELLING OR PAIN  UNUSUAL VAGINAL DISCHARGE OR ITCHING   Items with * indicate a potential emergency and should be followed up as soon as possible or go to the Emergency Department if any problems should occur.  Please show the CHEMOTHERAPY ALERT CARD or IMMUNOTHERAPY ALERT CARD at  check-in to the Emergency Department and triage nurse.  Should you have questions after your visit or need to cancel or reschedule your appointment, please contact [facility]  Dept: [phone]  and follow the prompts.  Office hours are 8:00 a.m. to 4:30 p.m. Monday - Friday. Please note that voicemails left after 4:00 p.m. may not be returned until the following business day.  We are closed weekends and major holidays. You have access to a nurse at all times for urgent questions. Please call the main number to the clinic Dept: [phone] and follow the prompts.   For any non-urgent questions, you may also contact your provider using MyChart. We now offer e-Visits for anyone 82 and older to request care online for non-urgent symptoms. For details visit [email or web address].   Also download the MyChart app! Go to the app store, search "MyChart", open the app, select [facility], and log in with your MyChart username and password.

## 2023-02-19 NOTE — Assessment & Plan Note (Signed)
-  Refill tramadol 50 mg every 8 hours as needed for pain -recommend she add back daily dosing miralax to reduce constipation, thus improving rectal pain.  -continue stool softener/gentle laxative as prescribed.

## 2023-02-19 NOTE — Assessment & Plan Note (Signed)
Poor appetite since starting Capeox -will hold today's chemotherapy due to severity of appetite and weight loss. -reassess at next visit.

## 2023-02-19 NOTE — Progress Notes (Signed)
Nutrition  Infusion shortened today and patient had already been released from infusion room prior to RD seeing her.  Will follow-up at next visit.    B. Freida Busman, RD, LDN Registered Dietitian [phone]

## 2023-02-21 NOTE — Congregational Nurse Program (Signed)
Home visit with interpreter [name].  Patient states she is feeling good and able to eat better since treatment on 02/19/2023. She is taking Xeloda as directed.  Scheduled C-T scan appointment for 03/24/2023 with a 10:15 arrival time.  CN will request transportation for appointment.  [name] RN, Congregational Nurse [phone]

## 2023-02-24 ENCOUNTER — Other Ambulatory Visit: Payer: Self-pay

## 2023-02-25 ENCOUNTER — Other Ambulatory Visit: Payer: Self-pay

## 2023-02-27 ENCOUNTER — Other Ambulatory Visit (HOSPITAL_COMMUNITY): Payer: Self-pay

## 2023-02-27 ENCOUNTER — Other Ambulatory Visit: Payer: Self-pay | Admitting: Hematology

## 2023-02-27 MED ORDER — RIVAROXABAN 20 MG PO TABS
20.0000 mg | ORAL_TABLET | Freq: Every day | ORAL | 2 refills | Status: DC
  Filled 2023-02-27: qty 30, 30d supply, fill #0
  Filled 2023-04-24 (×2): qty 30, 30d supply, fill #1

## 2023-02-27 NOTE — Congregational Nurse Program (Addendum)
Home visit to deliver the following medications which were picked up from [facility]:  Lantanoprost Ophthalmic drops, xarelto 20 mg, mirtazapine 30 mg, gabapentin 100 mg, amlodipine 5 mg, and lisinopril-hydrochlorothiazide 20/12.5 mg.  Reviewed instructions and patient wrote them in her language on bottles.  Patient states she is taking miralax daily and dulcolax 3 tablets as needed. States her appetite continues to be poor.   [name] RN, Congregational Nurse [phone]

## 2023-03-01 ENCOUNTER — Other Ambulatory Visit (HOSPITAL_COMMUNITY): Payer: Self-pay

## 2023-03-02 ENCOUNTER — Other Ambulatory Visit: Payer: Self-pay

## 2023-03-05 ENCOUNTER — Telehealth: Payer: Self-pay

## 2023-03-05 NOTE — Telephone Encounter (Signed)
Called patient (with assistance from interpreter [name]) to inform hr that  Dr. Mosetta Putt wants her to stop oral chemo (Xeloda) after today  She missed several doses but does not need to finish remaining pills.  Reminded her of home visit appointment on 03/07/2023@ 2:00 pm with SW [place] from [facility].  [name] RN, Congregational Nurse [phone]

## 2023-03-06 ENCOUNTER — Other Ambulatory Visit: Payer: Self-pay

## 2023-03-07 ENCOUNTER — Other Ambulatory Visit: Payer: Self-pay

## 2023-03-07 ENCOUNTER — Other Ambulatory Visit: Payer: Self-pay | Admitting: Hematology

## 2023-03-07 NOTE — Congregational Nurse Program (Signed)
Home visit with interpreter Truddie Crumble and Med student Lucious Groves. We met [facility] palliative care nurse PJ who did patient assessment with assistance from interpreter Diu.  Patient stated her main concern is loss of appetite although it has improved in the last week.  PJ recommended elderberries (fruit or gummies) and [employer] fruit drinks and immunity juice. Patient stated tramadol relieves pain and she normally takes it 1 or 2 times daily.  Patient stated she likes vanilla Ensure but not chocolate; PJ will check to see if any is available at [facility].  Patient states she continues to use miralax and dulcolax and has had normal BM's for past week.  She finished Xeloda on 03/05/2023 and will resume on 03/13/2023 when she has IV chemo. CN requested transportation for 08/27 appointment.  [name] RN, Congregational Nurse [phone]

## 2023-03-08 ENCOUNTER — Other Ambulatory Visit: Payer: Self-pay

## 2023-03-09 ENCOUNTER — Other Ambulatory Visit (HOSPITAL_COMMUNITY): Payer: Self-pay

## 2023-03-09 ENCOUNTER — Other Ambulatory Visit: Payer: Self-pay | Admitting: Hematology

## 2023-03-09 NOTE — Telephone Encounter (Signed)
I would hold off on refill for now. She has follow up scheduled for 03/13/2023. Will reassess readiness to resume chemo first. Thanks.

## 2023-03-09 NOTE — Telephone Encounter (Signed)
Jillian Jews, NP  [name], RN Caller: Unspecified (2 days ago, 12:31 PM) I would hold off on refill for now. She has follow up scheduled for 03/13/2023. Will reassess readiness to resume chemo first. Thanks.

## 2023-03-12 ENCOUNTER — Telehealth: Payer: Self-pay

## 2023-03-12 ENCOUNTER — Other Ambulatory Visit: Payer: Self-pay | Admitting: Hematology

## 2023-03-12 DIAGNOSIS — C20 Malignant neoplasm of rectum: Secondary | ICD-10-CM

## 2023-03-12 NOTE — Progress Notes (Unsigned)
Patient Care Team: [name], MD as PCP - General [name], RN (Inactive) as Oncology Nurse Navigator [name], NP as Nurse Practitioner (Nurse Practitioner) Malachy Mood, MD as Consulting Physician (Hematology and Oncology) Dorothy Puffer, MD as Consulting Physician (Radiation Oncology) [name], Netty Starring., MD as Consulting Physician (Gastroenterology) Josephine Igo, DO as Consulting Physician (Pulmonary Disease) Karie Soda, MD as Consulting Physician (General Surgery)  Clinic Day:  03/12/2023  Referring physician: [name], MD  ASSESSMENT & PLAN:   Assessment & Plan: 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 -Despite strong recommendation, patient firmly and repeatedly declined IV chemo and surgery. She agreed to chemoRT with Xeloda, and received the treatment 01/04/21 - 02/14/21. -she developed local cancer progression and lung mets in 11/2021 -she started Xeloda on 02/16/2022, low dose oxaliplatin was added on 05/11/22 (with cycle 4 of Xeloda)  -chemo held since 07/2022 due to leg pain and recurrent nausea  -restaging CT 09/07/2022 showed stable disease in rectum and lung, no new lesions.   -she had chemo break from Feb-May 2024 -restart CAPOX with dose reduction 12/18/2022 03/13/2023 - Cycle 9 day 1 with dose reduced COLORECTAL CAPEOX. Oxaliplatin was held from last treatment on 02/20/2023 -negative side effects -labs -proceed with treatment   Plan Tolerance of treatment Labs Proceed Labs/flush, follow up, and infusion in 3 weeks    The patient understands the plans discussed today and is in agreement with them.  She knows to contact our office if she develops concerns prior to her next appointment.  I provided *** minutes of face-to-face time during this  encounter and > 50% was spent counseling as documented under my assessment and plan.    Carlean Jews, NP  [place] [facility] AT [facility] [address] [place] [address] Dept: [phone] Dept Fax: [phone]   No orders of the defined types were placed in this encounter.     CHIEF COMPLAINT:  CC: rectal adenocarcinoma   Current Treatment:  COLORECTAL CAPEOX  INTERVAL HISTORY:  Jonathan is here today for repeat clinical assessment. She denies fevers or chills. She denies pain. Her appetite is good. Her weight {Weight change:10426}.  -restaging CT 09/07/2022 showed stable disease in rectum and lung, no new lesions.   -she had chemo break from Feb-May 2024 -restart CAPOX with dose reduction 12/18/2022 03/13/2023 - Cycle 9 day 1 with dose reduced COLORECTAL CAPEOX. Oxaliplatin was held from last treatment on 02/20/2023 -negative side effects -labs -proceed with treatment  I have reviewed the past medical history, past surgical history, social history and family history with the patient and they are unchanged from previous note.  ALLERGIES:  is allergic to no known allergies.  MEDICATIONS:  Current Outpatient Medications  Medication Sig Dispense Refill   amLODipine (NORVASC) 5 MG tablet Take 1 tablet (5 mg total) by mouth daily. 90 tablet 3   brimonidine (ALPHAGAN) 0.2 % ophthalmic solution Place 1 drop into the right eye 2 (two) times daily. 15 mL 3   brimonidine (ALPHAGAN) 0.2 % ophthalmic solution Place 1 drop into the right eye 2 (two) times daily. 10 mL 11   capecitabine (XELODA) 500 MG tablet Take 2 tabs every 12 hours, for 14 days then off for 7 days. Take after meal 56 tablet 2   dorzolamide-timolol (COSOPT)  2-0.5 % ophthalmic solution Place 1 drop into both eyes 2 times daily. 30 mL 3   dorzolamide-timolol (COSOPT) 2-0.5 % ophthalmic solution Instill 1 drop into both eyes twice a day 10 mL 11   gabapentin  (NEURONTIN) 100 MG capsule Take 1 capsule (100 mg total) by mouth 3 (three) times daily as needed. 90 capsule 2   latanoprost (XALATAN) 0.005 % ophthalmic solution Instill 1 drop into both eyes every night at bedtime 7.5 mL 3   latanoprost (XALATAN) 0.005 % ophthalmic solution Place 1 drop into both eyes at bedtime. 7.5 mL 6   lidocaine (XYLOCAINE) 5 % ointment Apply to anal area daily as needed. 50 g 1   lisinopril-hydrochlorothiazide (ZESTORETIC) 20-12.5 MG tablet Take 2 tablets by mouth daily. 60 tablet 3   mirtazapine (REMERON) 30 MG tablet Take 1 tablet (30 mg total) by mouth at bedtime. 30 tablet 1   Multiple Vitamins-Minerals (MULTIVITAMIN WITH MINERALS) tablet Take 1 tablet by mouth daily. Gummy 50 mg     ondansetron (ZOFRAN) 8 MG tablet Take 1 tablet (8 mg total) by mouth every 8 (eight) hours as needed for nausea or vomiting. Take after 3 days of chemo 30 tablet 2   polyethylene glycol powder (GLYCOLAX/MIRALAX) 17 GM/SCOOP powder Take 1 Container by mouth once.     prochlorperazine (COMPAZINE) 10 MG tablet Take 1 tablet (10 mg total) by mouth every 6 (six) hours as needed. 30 tablet 2   rivaroxaban (XARELTO) 20 MG TABS tablet Take 1 tablet (20 mg total) by mouth daily with supper. 30 tablet 2   traMADol (ULTRAM) 50 MG tablet Take 1 tablet (50 mg total) by mouth every 8 (eight) hours as needed. 60 tablet 0   urea (CARMOL) 10 % cream Apply topically 2 times daily as needed. 85 g 3   No current facility-administered medications for this visit.    HISTORY OF PRESENT ILLNESS:   Oncology History Overview Note  Cancer Staging Rectal adenocarcinoma [facility]) Staging form: Colon and Rectum, AJCC 8th Edition - Clinical stage from 12/21/2020: Stage IIIB (cT3, cN1, cM0) - Unsigned    Rectal adenocarcinoma (HCC)  08/26/2020 Miscellaneous   Initial presentation to PCP, reporting intermittent BRBPR since 02/2020    12/01/2020 Procedure   Colonoscopy by Dr. Lavon Paganini findings - The perianal and  digital rectal examinations were normal. - A 15 mm polyp was found in the ascending colon. The polyp was semi-pedunculated. Resection and retrieval were complete. - A 25 mm polyp was found in the sigmoid colon. The polyp was pedunculated. Resection and retrieval were complete. - An infiltrative partially obstructing large mass was found in the proximal rectum. The mass was partially circumferential (involving one-half of the lumen circumference). The mass measured eight cm in length extending from 10 to 18cm from anal verge. This was biopsied with a cold forceps for histology. Proximal and distal opposite fold area of the mass lesion was tattooed with an injection of total 3 mL of Spot (carbon black).   12/01/2020 Initial Biopsy   Diagnosis 1. Colon, polyp(s), ascending x 1 - ADENOCARCINOMA ARISING IN A TUBULAR ADENOMA WITH HIGH-GRADE DYSPLASIA. SEE NOTE 2. Colon, sigmoid polyp, x1 - TUBULOVILLOUS ADENOMA(S) - NEGATIVE FOR HIGH-GRADE DYSPLASIA OR MALIGNANCY 3. Rectum, biopsy - ADENOCARCINOMA. SEE NOTE   12/01/2020 Cancer Staging   Cancer Staging Rectal adenocarcinoma [facility]) Staging form: Colon and Rectum, AJCC 8th Edition - Clinical stage from 12/21/2020: Stage IIIB (cT3, cN1, cM0) - Unsigned    12/06/2020 Imaging   CT  CAP with contrast IMPRESSION: 1. There is partially circumferential soft tissue thickening of the mid to superior rectum, approximately 5 cm in length and the inferior extent approximately 6 cm above the anal verge, consistent with primary rectal malignancy identified by colonoscopy. 2. There appear to be abnormally enlarged perirectal lymph nodes posteriorly about the superior rectum measuring up to 1.0 x 0.8 cm. Findings are suspicious for perirectal nodal metastatic disease, however rectal MRI is the test of choice for initial local staging of rectal cancer. 3. There is a 4 mm nonspecific pulmonary nodule of the superior segment left lower lobe, statistically most likely  incidental, infectious or inflammatory, although nonspecific and isolated metastatic disease is not strictly excluded. Attention on follow-up. 4. No other evidence of metastatic disease in the chest, abdomen, or pelvis. Aortic Atherosclerosis (ICD10-I70.0).   12/09/2020 Imaging   Local staging MRI pelvis without contrast IMPRESSION: 4.9 cm circumferential mid/lower rectal mass, corresponding to the patient's newly diagnosed rectal cancer. Rectal adenocarcinoma T stage: T3c Rectal adenocarcinoma N stage:  N1 Distance from tumor to the internal anal sphincter is 3.7 cm.   12/21/2020 Initial Diagnosis   Rectal adenocarcinoma (HCC)   01/04/2021 -  Chemotherapy   Concurrent chemoradiation with Xeloda 1000mg  in the AM and 1500mg  in the PM on days of Radiation starting 01/04/21.       01/04/2021 - 02/11/2021 Radiation Therapy   Concurrent chemoradiation with Dr Mitzi Hansen and Xeloda starting 01/04/21.    01/31/2022 Procedure   Colonoscopy, Dr. Lavon Paganini  Findings: - An infiltrative partially obstructing large mass was found in the rectum. The mass was circumferential. The mass measured ten cm in length, extending from 5-15cm from anal verge. In addition, rectal diameter at narrowest approx twelve mm. Oozing was present. Biopsies were taken with a cold forceps for histology.  Impression: - Hemorrhoids found on perianal exam. - Likely malignant partially obstructing tumor in the rectum. Biopsied. - Non-bleeding external and internal hemorrhoids.   01/31/2022 Pathology Results   Diagnosis Rectum, biopsy INVASIVE MODERATELY DIFFERENTIATED ADENOCARCINOMA   05/11/2022 -  Chemotherapy   Patient is on Treatment Plan : COLORECTAL Xelox (Capeox)(130/850) q21d     09/07/2022 Imaging    IMPRESSION: 1. No significant change in circumferential wall thickening of the rectum, in keeping with known primary rectal mass. Similar appearance of post treatment perirectal and presacral fat stranding and fascial  thickening 2. Unchanged nodule of the superior segment left lower lobe with adjacent bandlike scarring. No new nodules. 3. No evidence of lymphadenopathy or other metastatic disease in the chest, abdomen, or pelvis. 4. Large burden of stool throughout the colon. 5. Cardiomegaly.   12/08/2022 Imaging    IMPRESSION: Persistent wall thickening along the rectum with adjacent severe inflammatory stranding and nodularity. Please correlate with exact location of the distribution of the patient's neoplasm.   No discrete new mass lesion, fluid collection or lymph node enlargement.   Improved visualization of the fractures involving the left side of the pubic symphysis as well as probable insufficiency fractures along the sacrum. Associated areas of increasing heterogeneous bony sclerosis along the pubic bones and sacrum. In principle sclerotic bone metastases would be in the differential but are felt to be less likely based on the overall appearance. If needed confirmatory MRI can be considered as clinically appropriate to further delineate.   Stable scarring and fibrotic changes along the lungs with a tiny nodular area along the superior segment of the left lower lobe. Simple continued follow up.   Enlarged heart.  REVIEW OF SYSTEMS:   Constitutional: Denies fevers, chills or abnormal weight loss Eyes: Denies blurriness of vision Ears, nose, mouth, throat, and face: Denies mucositis or sore throat Respiratory: Denies cough, dyspnea or wheezes Cardiovascular: Denies palpitation, chest discomfort or lower extremity swelling Gastrointestinal:  Denies nausea, heartburn or change in bowel habits Skin: Denies abnormal skin rashes Lymphatics: Denies new lymphadenopathy or easy bruising Neurological:Denies numbness, tingling or new weaknesses Behavioral/Psych: Mood is stable, no new changes  All other systems were reviewed with the patient and are negative.   VITALS:  There  were no vitals taken for this visit.  Wt Readings from Last 3 Encounters:  02/19/23 101 lb 6.4 oz (46 kg)  01/29/23 104 lb 8 oz (47.4 kg)  01/09/23 107 lb 1.6 oz (48.6 kg)    There is no height or weight on file to calculate BMI.  Performance status (ECOG): {CHL ONC [id number]  PHYSICAL EXAM:   GENERAL:alert, no distress and comfortable SKIN: skin color, texture, turgor are normal, no rashes or significant lesions EYES: normal, Conjunctiva are pink and non-injected, sclera clear OROPHARYNX:no exudate, no erythema and lips, buccal mucosa, and tongue normal  NECK: supple, thyroid normal size, non-tender, without nodularity LYMPH:  no palpable lymphadenopathy in the cervical, axillary or inguinal LUNGS: clear to auscultation and percussion with normal breathing effort HEART: regular rate & rhythm and no murmurs and no lower extremity edema ABDOMEN:abdomen soft, non-tender and normal bowel sounds Musculoskeletal:no cyanosis of digits and no clubbing  NEURO: alert & oriented x 3 with fluent speech, no focal motor/sensory deficits  LABORATORY DATA:  I have reviewed the data as listed    Component Value Date/Time   NA 139 02/19/2023 1030   NA 139 09/29/2019 1406   K 4.0 02/19/2023 1030   CL 105 02/19/2023 1030   CO2 28 02/19/2023 1030   GLUCOSE 134 (H) 02/19/2023 1030   BUN 11 02/19/2023 1030   BUN 12 09/29/2019 1406   CREATININE 0.72 02/19/2023 1030   CALCIUM 8.9 02/19/2023 1030   PROT 6.8 02/19/2023 1030   PROT 6.6 06/22/2016 1004   ALBUMIN 3.5 02/19/2023 1030   ALBUMIN 4.1 06/22/2016 1004   AST 16 02/19/2023 1030   ALT <5 02/19/2023 1030   ALKPHOS 64 02/19/2023 1030   BILITOT 0.4 02/19/2023 1030   GFRNONAA >60 02/19/2023 1030   GFRAA 88 09/29/2019 1406    Lab Results  Component Value Date   WBC 3.8 (L) 02/19/2023   NEUTROABS 2.4 02/19/2023   HGB 10.6 (L) 02/19/2023   HCT 32.7 (L) 02/19/2023   MCV 73.8 (L) 02/19/2023   PLT 291 02/19/2023

## 2023-03-12 NOTE — Telephone Encounter (Signed)
Phone call to patient to inform her that [facility] called to state Dr. Mosetta Putt wants to wait until she sees her on 03/13/2023 to determine if she is going to reorder Xeloda due to loss of appetite.  [name] RN, Congregational Nurse [phone]

## 2023-03-12 NOTE — Assessment & Plan Note (Signed)
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 -Despite strong recommendation, patient firmly and repeatedly declined IV chemo and surgery. She agreed to chemoRT with Xeloda, and received the treatment 01/04/21 - 02/14/21. -she developed local cancer progression and lung mets in 11/2021 -she started Xeloda on 02/16/2022, low dose oxaliplatin was added on 05/11/22 (with cycle 4 of Xeloda)  -chemo held since 07/2022 due to leg pain and recurrent nausea  -restaging CT 09/07/2022 showed stable disease in rectum and lung, no new lesions.   -she had chemo break from Feb-May 2024 -restart CAPOX with dose reduction 12/18/2022. Oxaliplatin was held from last treatment on 02/20/2023 per her request  -she does feel better with more energy and better appetite with hold oxaliplatin last time, she would like to restart today

## 2023-03-13 ENCOUNTER — Other Ambulatory Visit: Payer: Medicaid Other

## 2023-03-13 ENCOUNTER — Inpatient Hospital Stay: Payer: Medicaid Other

## 2023-03-13 ENCOUNTER — Encounter: Payer: Self-pay | Admitting: Hematology

## 2023-03-13 ENCOUNTER — Other Ambulatory Visit (HOSPITAL_COMMUNITY): Payer: Self-pay

## 2023-03-13 ENCOUNTER — Other Ambulatory Visit: Payer: Self-pay

## 2023-03-13 ENCOUNTER — Inpatient Hospital Stay (HOSPITAL_BASED_OUTPATIENT_CLINIC_OR_DEPARTMENT_OTHER): Payer: Medicaid Other | Admitting: Hematology

## 2023-03-13 ENCOUNTER — Inpatient Hospital Stay: Payer: Medicaid Other | Admitting: Dietician

## 2023-03-13 VITALS — BP 126/75 | HR 70 | Temp 97.8°F | Resp 17 | Wt 103.9 lb

## 2023-03-13 DIAGNOSIS — K6289 Other specified diseases of anus and rectum: Secondary | ICD-10-CM | POA: Diagnosis not present

## 2023-03-13 DIAGNOSIS — Z5111 Encounter for antineoplastic chemotherapy: Secondary | ICD-10-CM | POA: Diagnosis not present

## 2023-03-13 DIAGNOSIS — C20 Malignant neoplasm of rectum: Secondary | ICD-10-CM

## 2023-03-13 DIAGNOSIS — I82432 Acute embolism and thrombosis of left popliteal vein: Secondary | ICD-10-CM

## 2023-03-13 DIAGNOSIS — D509 Iron deficiency anemia, unspecified: Secondary | ICD-10-CM

## 2023-03-13 LAB — TOTAL PROTEIN, URINE DIPSTICK: Protein, ur: NEGATIVE mg/dL

## 2023-03-13 LAB — CMP (CANCER CENTER ONLY)
ALT: <5 U/L (ref 0–44)
AST: 16 U/L (ref 15–41)
Albumin: 3.7 g/dL (ref 3.5–5.0)
Alkaline Phosphatase: 68 U/L (ref 38–126)
Anion gap: 7 (ref 5–15)
BUN: 11 mg/dL (ref 8–23)
CO2: 27 mmol/L (ref 22–32)
Calcium: 8.9 mg/dL (ref 8.9–10.3)
Chloride: 103 mmol/L (ref 98–111)
Creatinine: 0.77 mg/dL (ref 0.44–1.00)
GFR, Estimated: >60 mL/min (ref >60)
Glucose, Bld: 94 mg/dL (ref 70–99)
Potassium: 3.9 mmol/L (ref 3.5–5.1)
Sodium: 137 mmol/L (ref 135–145)
Total Bilirubin: 0.4 mg/dL (ref 0.3–1.2)
Total Protein: 7.3 g/dL (ref 6.5–8.1)

## 2023-03-13 LAB — CBC WITH DIFFERENTIAL (CANCER CENTER ONLY)
Abs Immature Granulocytes: 0.01 10*3/uL (ref 0.00–0.07)
Basophils Absolute: 0.1 10*3/uL (ref 0.0–0.1)
Basophils Relative: 1 %
Eosinophils Absolute: 0.2 10*3/uL (ref 0.0–0.5)
Eosinophils Relative: 4 %
HCT: 34.2 % — ABNORMAL LOW (ref 36.0–46.0)
Hemoglobin: 11.1 g/dL — ABNORMAL LOW (ref 12.0–15.0)
Immature Granulocytes: 0 %
Lymphocytes Relative: 22 %
Lymphs Abs: 1.1 10*3/uL (ref 0.7–4.0)
MCH: 24.8 pg — ABNORMAL LOW (ref 26.0–34.0)
MCHC: 32.5 g/dL (ref 30.0–36.0)
MCV: 76.3 fL — ABNORMAL LOW (ref 80.0–100.0)
Monocytes Absolute: 0.7 10*3/uL (ref 0.1–1.0)
Monocytes Relative: 15 %
Neutro Abs: 2.8 10*3/uL (ref 1.7–7.7)
Neutrophils Relative %: 58 %
Platelet Count: 243 10*3/uL (ref 150–400)
RBC: 4.48 MIL/uL (ref 3.87–5.11)
RDW: 18.7 % — ABNORMAL HIGH (ref 11.5–15.5)
WBC Count: 4.8 10*3/uL (ref 4.0–10.5)
nRBC: 0 % (ref 0.0–0.2)

## 2023-03-13 LAB — CEA (ACCESS): CEA (CHCC): 11.36 ng/mL — ABNORMAL HIGH (ref 0.00–5.00)

## 2023-03-13 LAB — FERRITIN: Ferritin: 70 ng/mL (ref 11–307)

## 2023-03-13 MED ORDER — PALONOSETRON HCL INJECTION 0.25 MG/5ML
0.2500 mg | Freq: Once | INTRAVENOUS | Status: AC
  Administered 2023-03-13: 0.25 mg via INTRAVENOUS
  Filled 2023-03-13: qty 5

## 2023-03-13 MED ORDER — SODIUM CHLORIDE 0.9 % IV SOLN: Freq: Once | INTRAVENOUS | Status: AC

## 2023-03-13 MED ORDER — DEXTROSE 5 % IV SOLN: Freq: Once | INTRAVENOUS | Status: AC

## 2023-03-13 MED ORDER — DIPHENHYDRAMINE HCL 50 MG/ML IJ SOLN
25.0000 mg | Freq: Once | INTRAMUSCULAR | Status: AC
  Administered 2023-03-13: 25 mg via INTRAVENOUS
  Filled 2023-03-13: qty 1

## 2023-03-13 MED ORDER — FAMOTIDINE IN NACL 20-0.9 MG/50ML-% IV SOLN
20.0000 mg | Freq: Once | INTRAVENOUS | Status: AC
  Administered 2023-03-13: 20 mg via INTRAVENOUS
  Filled 2023-03-13: qty 50

## 2023-03-13 MED ORDER — OXALIPLATIN CHEMO INJECTION 100 MG/20ML
70.0000 mg/m2 | Freq: Once | INTRAVENOUS | Status: AC
  Administered 2023-03-13: 100 mg via INTRAVENOUS
  Filled 2023-03-13: qty 20

## 2023-03-13 MED ORDER — TRAMADOL HCL 50 MG PO TABS
50.0000 mg | ORAL_TABLET | Freq: Three times a day (TID) | ORAL | 0 refills | Status: DC | PRN
  Filled 2023-03-13 (×2): qty 60, 20d supply, fill #0

## 2023-03-13 MED ORDER — CAPECITABINE 500 MG PO TABS
ORAL_TABLET | ORAL | 2 refills | Status: DC
Start: 2023-03-13 — End: 2023-04-30
  Filled 2023-03-13 (×2): qty 56, 14d supply, fill #0
  Filled 2023-03-22: qty 56, 14d supply, fill #1
  Filled 2023-04-13: qty 56, 14d supply, fill #2

## 2023-03-13 MED ORDER — SODIUM CHLORIDE 0.9 % IV SOLN
10.0000 mg | Freq: Once | INTRAVENOUS | Status: AC
  Administered 2023-03-13: 10 mg via INTRAVENOUS
  Filled 2023-03-13: qty 10

## 2023-03-13 MED ORDER — SODIUM CHLORIDE 0.9 % IV SOLN
7.5000 mg/kg | Freq: Once | INTRAVENOUS | Status: AC
  Administered 2023-03-13: 350 mg via INTRAVENOUS
  Filled 2023-03-13: qty 14

## 2023-03-13 NOTE — Assessment & Plan Note (Signed)
-  Doppler on July 13, 2022 showed left acute DVT involving popliteal and peroneal vein, CTA chest was negative for PE. -continue Xarelto, tolerating well

## 2023-03-13 NOTE — Progress Notes (Signed)
Nutrition Follow-up:  Pt with colorectal cancer. She is receiving dose reduced CapeOx.   Met with pt in infusion. Interpretor is not present for visit today. Pt declined use of video interpretor. She reports appetite has been better and eating more. Unable to obtain recall at this time. Pt is not currently drinking Ensure due to only having chocolate flavor which she does not like. Pt denies nausea, vomiting, diarrhea. She reports constipation. Having a bowel movement every 3 days. Had a bowel movement yesterday.    Medications: reviewed   Labs: reviewed   Anthropometrics: Wt 103 lb 14.4 oz today increased   8/5 - 101 lb 6.4 oz  7/15 - 104 lb 8 oz    INTERVENTION:  Encouraged daily Ensure Plus/equivalent for added calories and protein - vanilla samples + coupons provided Bowel regimen per MD    MONITORING, EVALUATION, GOAL: wt trends, intake   NEXT VISIT: Monday September 16 during infusion with Britta Mccreedy

## 2023-03-13 NOTE — Congregational Nurse Program (Signed)
Accompanied patient to [facility] appointment.  Assisted with transportation issues getting patient to appointment.  Picked up tramadol and capecitabine form [facility] pharmacy and took it to patient while receiving IV chemo.  She will start capecitabine this evening.  Also picked up [employer] juice and elderberry gummies for patient to try as recommended by palliative care nurse.  [name] RN, Congregational Nurse [phone]

## 2023-03-13 NOTE — Progress Notes (Deleted)
Nutrition Follow-up:  Pt with colorectal cancer. She is currently receiving dose reduced Capeox secondary to side effects  8/6 - oxaliplatin held    Medications: ***  Labs: ***  Anthropometrics: Wt 103 lb 14.4 oz today - increased  8/6 - 101 lb 6.4 oz 7/15 - 104 lb 8 oz     NUTRITION DIAGNOSIS: Food and nutrition related knowledge deficit ***      INTERVENTION: ***    MONITORING, EVALUATION, GOAL: wt trends, intake   NEXT VISIT: ***  ***

## 2023-03-13 NOTE — Progress Notes (Signed)
[facility]   [phone] [phone]   Clinic Follow up Note   Patient Care Team: [name], MD as PCP - General [name], RN (Inactive) as Oncology Nurse Navigator [name], NP as Nurse Practitioner (Nurse Practitioner) Malachy Mood, MD as Consulting Physician (Hematology and Oncology) Dorothy Puffer, MD as Consulting Physician (Radiation Oncology) [name], Netty Starring., MD as Consulting Physician (Gastroenterology) Josephine Igo, DO as Consulting Physician (Pulmonary Disease) Karie Soda, MD as Consulting Physician (General Surgery)  Date of Service:  03/13/2023  CHIEF COMPLAINT: f/u of rectal cancer  CURRENT THERAPY:    Xeloda 1000 mg twice daily for 14 days then 7 days off. CapeOx at reduced dose every 3 weeks   ASSESSMENT:  Jillian Lutz is a 72 y.o. female with   Rectal adenocarcinoma (HCC) cT3cN1M0 stage IIIb, biopsy confirmed residual primary tumor and oligo lung met in 09/2021 -Initially diagnosed 11/2020 by colonoscopy for progressive rectal pain and bleeding, weight loss, and constipation. -Despite strong recommendation, patient firmly and repeatedly declined IV chemo and surgery. She agreed to chemoRT with Xeloda, and received the treatment 01/04/21 - 02/14/21. -she developed local cancer progression and lung mets in 11/2021 -she started Xeloda on 02/16/2022, low dose oxaliplatin was added on 05/11/22 (with cycle 4 of Xeloda)  -chemo held since 07/2022 due to leg pain and recurrent nausea  -restaging CT 09/07/2022 showed stable disease in rectum and lung, no new lesions.   -she had chemo break from Feb-May 2024 -restart CAPOX with dose reduction 12/18/2022. Oxaliplatin was held from last treatment on 02/20/2023 per her request  -she does feel better with more energy and better appetite with hold oxaliplatin last time, she would like to restart today    Left leg DVT (HCC) -Doppler on July 13, 2022 showed left acute  DVT involving popliteal and peroneal vein, CTA chest was negative for PE. -continue Xarelto, tolerating well      PLAN: -lab reviewed -CMP-pending -Tumor Marker-pending -pt agree to start back Oxaliplatin treatment -I refilled Xeloda  -proceed with Vegzelma + Eloxatin today at reduce dose due to her tolerance issue  -I refill Tramadol for pain  -CT scan schedule for 9/9 -f/u on 9/16  SUMMARY OF ONCOLOGIC HISTORY: Oncology History Overview Note  Cancer Staging Rectal adenocarcinoma [facility]) Staging form: Colon and Rectum, AJCC 8th Edition - Clinical stage from 12/21/2020: Stage IIIB (cT3, cN1, cM0) - Unsigned    Rectal adenocarcinoma (HCC)  08/26/2020 Miscellaneous   Initial presentation to PCP, reporting intermittent BRBPR since 02/2020    12/01/2020 Procedure   Colonoscopy by Dr. Lavon Paganini findings - The perianal and digital rectal examinations were normal. - A 15 mm polyp was found in the ascending colon. The polyp was semi-pedunculated. Resection and retrieval were complete. - A 25 mm polyp was found in the sigmoid colon. The polyp was pedunculated. Resection and retrieval were complete. - An infiltrative partially obstructing large mass was found in the proximal rectum. The mass was partially circumferential (involving one-half of the lumen circumference). The mass measured eight cm in length extending from 10 to 18cm from anal verge. This was biopsied with a cold forceps for histology. Proximal and distal opposite fold area of the mass lesion was tattooed with an injection of total 3 mL of Spot (carbon black).   12/01/2020 Initial Biopsy   Diagnosis 1. Colon, polyp(s), ascending x 1 - ADENOCARCINOMA ARISING IN A TUBULAR ADENOMA WITH HIGH-GRADE DYSPLASIA. SEE NOTE 2. Colon, sigmoid polyp, x1 -  TUBULOVILLOUS ADENOMA(S) - NEGATIVE FOR HIGH-GRADE DYSPLASIA OR MALIGNANCY 3. Rectum, biopsy - ADENOCARCINOMA. SEE NOTE   12/01/2020 Cancer Staging   Cancer Staging Rectal  adenocarcinoma [facility]) Staging form: Colon and Rectum, AJCC 8th Edition - Clinical stage from 12/21/2020: Stage IIIB (cT3, cN1, cM0) - Unsigned    12/06/2020 Imaging   CT CAP with contrast IMPRESSION: 1. There is partially circumferential soft tissue thickening of the mid to superior rectum, approximately 5 cm in length and the inferior extent approximately 6 cm above the anal verge, consistent with primary rectal malignancy identified by colonoscopy. 2. There appear to be abnormally enlarged perirectal lymph nodes posteriorly about the superior rectum measuring up to 1.0 x 0.8 cm. Findings are suspicious for perirectal nodal metastatic disease, however rectal MRI is the test of choice for initial local staging of rectal cancer. 3. There is a 4 mm nonspecific pulmonary nodule of the superior segment left lower lobe, statistically most likely incidental, infectious or inflammatory, although nonspecific and isolated metastatic disease is not strictly excluded. Attention on follow-up. 4. No other evidence of metastatic disease in the chest, abdomen, or pelvis. Aortic Atherosclerosis (ICD10-I70.0).   12/09/2020 Imaging   Local staging MRI pelvis without contrast IMPRESSION: 4.9 cm circumferential mid/lower rectal mass, corresponding to the patient's newly diagnosed rectal cancer. Rectal adenocarcinoma T stage: T3c Rectal adenocarcinoma N stage:  N1 Distance from tumor to the internal anal sphincter is 3.7 cm.   12/21/2020 Initial Diagnosis   Rectal adenocarcinoma (HCC)   01/04/2021 -  Chemotherapy   Concurrent chemoradiation with Xeloda 1000mg  in the AM and 1500mg  in the PM on days of Radiation starting 01/04/21.       01/04/2021 - 02/11/2021 Radiation Therapy   Concurrent chemoradiation with Dr Mitzi Hansen and Xeloda starting 01/04/21.    01/31/2022 Procedure   Colonoscopy, Dr. Lavon Paganini  Findings: - An infiltrative partially obstructing large mass was found in the rectum. The mass was  circumferential. The mass measured ten cm in length, extending from 5-15cm from anal verge. In addition, rectal diameter at narrowest approx twelve mm. Oozing was present. Biopsies were taken with a cold forceps for histology.  Impression: - Hemorrhoids found on perianal exam. - Likely malignant partially obstructing tumor in the rectum. Biopsied. - Non-bleeding external and internal hemorrhoids.   01/31/2022 Pathology Results   Diagnosis Rectum, biopsy INVASIVE MODERATELY DIFFERENTIATED ADENOCARCINOMA   05/11/2022 -  Chemotherapy   Patient is on Treatment Plan : COLORECTAL Xelox (Capeox)(130/850) q21d     09/07/2022 Imaging    IMPRESSION: 1. No significant change in circumferential wall thickening of the rectum, in keeping with known primary rectal mass. Similar appearance of post treatment perirectal and presacral fat stranding and fascial thickening 2. Unchanged nodule of the superior segment left lower lobe with adjacent bandlike scarring. No new nodules. 3. No evidence of lymphadenopathy or other metastatic disease in the chest, abdomen, or pelvis. 4. Large burden of stool throughout the colon. 5. Cardiomegaly.   12/08/2022 Imaging    IMPRESSION: Persistent wall thickening along the rectum with adjacent severe inflammatory stranding and nodularity. Please correlate with exact location of the distribution of the patient's neoplasm.   No discrete new mass lesion, fluid collection or lymph node enlargement.   Improved visualization of the fractures involving the left side of the pubic symphysis as well as probable insufficiency fractures along the sacrum. Associated areas of increasing heterogeneous bony sclerosis along the pubic bones and sacrum. In principle sclerotic bone metastases would be in the differential but  are felt to be less likely based on the overall appearance. If needed confirmatory MRI can be considered as clinically appropriate to further delineate.    Stable scarring and fibrotic changes along the lungs with a tiny nodular area along the superior segment of the left lower lobe. Simple continued follow up.   Enlarged heart.      INTERVAL HISTORY:  Jillian Lutz is here for a follow up of  colon and rectal cancer. She was last seen by NP  Heather on 02/19/2023. She presents to the clinic accompanied by an interpreter. Pt state that she is doing well. She has good appetite. Pt state that she wants to continue Chemotherapy. Pt state that she has a lost of taste from the oral chemo. Pt state when she has pain it in her rectum.     All other systems were reviewed with the patient and are negative.  MEDICAL HISTORY:  Past Medical History:  Diagnosis Date   Bilateral wrist pain 10/25/2017   Hypertension    Left leg DVT (deep venous thrombosis) (HCC) 06/2022   Neck mass 1998   Unknown biopsy results. Excised in [place].   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   Rectal cancer (HCC)    Trigger finger, acquired 02/08/2017    SURGICAL HISTORY: Past Surgical History:  Procedure Laterality Date   BRONCHIAL BIOPSY  12/13/2021   Procedure: BRONCHIAL BIOPSIES;  Surgeon: Josephine Igo, DO;  Location: [facility];  Service: Pulmonary;;   BRONCHIAL NEEDLE ASPIRATION BIOPSY  12/13/2021   Procedure: BRONCHIAL NEEDLE ASPIRATION BIOPSIES;  Surgeon: Josephine Igo, DO;  Location: [facility];  Service: Pulmonary;;   NECK SURGERY Left 1998   Excision of mass in [place]   VIDEO BRONCHOSCOPY WITH RADIAL ENDOBRONCHIAL ULTRASOUND  12/13/2021   Procedure: RADIAL ENDOBRONCHIAL ULTRASOUND;  Surgeon: Josephine Igo, DO;  Location: [facility];  Service: Pulmonary;;    I have reviewed the social history and family history with the patient and they are unchanged from previous note.  ALLERGIES:  is allergic to no known allergies.  MEDICATIONS:  Current Outpatient Medications  Medication Sig Dispense Refill    amLODipine (NORVASC) 5 MG tablet Take 1 tablet (5 mg total) by mouth daily. 90 tablet 3   brimonidine (ALPHAGAN) 0.2 % ophthalmic solution Place 1 drop into the right eye 2 (two) times daily. 15 mL 3   brimonidine (ALPHAGAN) 0.2 % ophthalmic solution Place 1 drop into the right eye 2 (two) times daily. 10 mL 11   capecitabine (XELODA) 500 MG tablet Take 2 tabs every 12 hours, for 14 days then off for 7 days. Take after meal 56 tablet 2   dorzolamide-timolol (COSOPT) 2-0.5 % ophthalmic solution Place 1 drop into both eyes 2 times daily. 30 mL 3   dorzolamide-timolol (COSOPT) 2-0.5 % ophthalmic solution Instill 1 drop into both eyes twice a day 10 mL 11   gabapentin (NEURONTIN) 100 MG capsule Take 1 capsule (100 mg total) by mouth 3 (three) times daily as needed. 90 capsule 2   latanoprost (XALATAN) 0.005 % ophthalmic solution Instill 1 drop into both eyes every night at bedtime 7.5 mL 3   latanoprost (XALATAN) 0.005 % ophthalmic solution Place 1 drop into both eyes at bedtime. 7.5 mL 6   lidocaine (XYLOCAINE) 5 % ointment Apply to anal area daily as needed. 50 g 1   lisinopril-hydrochlorothiazide (ZESTORETIC) 20-12.5 MG tablet Take 2 tablets by mouth daily. 60 tablet 3  mirtazapine (REMERON) 30 MG tablet Take 1 tablet (30 mg total) by mouth at bedtime. 30 tablet 1   Multiple Vitamins-Minerals (MULTIVITAMIN WITH MINERALS) tablet Take 1 tablet by mouth daily. Gummy 50 mg     ondansetron (ZOFRAN) 8 MG tablet Take 1 tablet (8 mg total) by mouth every 8 (eight) hours as needed for nausea or vomiting. Take after 3 days of chemo 30 tablet 2   polyethylene glycol powder (GLYCOLAX/MIRALAX) 17 GM/SCOOP powder Take 1 Container by mouth once.     prochlorperazine (COMPAZINE) 10 MG tablet Take 1 tablet (10 mg total) by mouth every 6 (six) hours as needed. 30 tablet 2   rivaroxaban (XARELTO) 20 MG TABS tablet Take 1 tablet (20 mg total) by mouth daily with supper. 30 tablet 2   traMADol (ULTRAM) 50 MG  tablet Take 1 tablet (50 mg total) by mouth every 8 (eight) hours as needed. 60 tablet 0   urea (CARMOL) 10 % cream Apply topically 2 times daily as needed. 85 g 3   No current facility-administered medications for this visit.    PHYSICAL EXAMINATION: ECOG PERFORMANCE STATUS: 1 - Symptomatic but completely ambulatory  Vitals:   03/13/23 1109  BP: 126/75  Pulse: 70  Resp: 17  Temp: 97.8 F (36.6 C)  SpO2: 97%   Wt Readings from Last 3 Encounters:  03/13/23 103 lb 14.4 oz (47.1 kg)  02/19/23 101 lb 6.4 oz (46 kg)  01/29/23 104 lb 8 oz (47.4 kg)     GENERAL:alert, no distress and comfortable SKIN: skin color normal, no rashes or significant lesions EYES: normal, Conjunctiva are pink and non-injected, sclera clear  NEURO: alert & oriented x 3 with fluent speech LABORATORY DATA:  I have reviewed the data as listed    Latest Ref Rng & Units 03/13/2023   10:11 AM 02/19/2023   10:30 AM 01/29/2023    9:15 AM  CBC  WBC 4.0 - 10.5 K/uL 4.8  3.8  3.8   Hemoglobin 12.0 - 15.0 g/dL 13.0  86.5  78.4   Hematocrit 36.0 - 46.0 % 34.2  32.7  32.9   Platelets 150 - 400 K/uL 243  291  272         Latest Ref Rng & Units 03/13/2023   10:11 AM 02/19/2023   10:30 AM 01/29/2023    9:15 AM  CMP  Glucose 70 - 99 mg/dL 94  696  295   BUN 8 - 23 mg/dL 11  11  13    Creatinine 0.44 - 1.00 mg/dL 2.84  1.32  4.40   Sodium 135 - 145 mmol/L 137  139  140   Potassium 3.5 - 5.1 mmol/L 3.9  4.0  3.5   Chloride 98 - 111 mmol/L 103  105  106   CO2 22 - 32 mmol/L 27  28  27    Calcium 8.9 - 10.3 mg/dL 8.9  8.9  8.9   Total Protein 6.5 - 8.1 g/dL 7.3  6.8  7.0   Total Bilirubin 0.3 - 1.2 mg/dL 0.4  0.4  0.3   Alkaline Phos 38 - 126 U/L 68  64  61   AST 15 - 41 U/L 16  16  15    ALT 0 - 44 U/L <5  <5  <5       RADIOGRAPHIC STUDIES: I have personally reviewed the radiological images as listed and agreed with the findings in the report. No results found.    Orders Placed This Encounter  Procedures    CBC with Differential (Cancer Center Only)    Standing Status:   Future    Standing Expiration Date:   04/03/2024   CMP (Cancer Center only)    Standing Status:   Future    Standing Expiration Date:   04/03/2024   CBC with Differential (Cancer Center Only)    Standing Status:   Future    Standing Expiration Date:   04/24/2024   CMP (Cancer Center only)    Standing Status:   Future    Standing Expiration Date:   04/24/2024   CBC with Differential (Cancer Center Only)    Standing Status:   Future    Standing Expiration Date:   05/15/2024   CMP (Cancer Center only)    Standing Status:   Future    Standing Expiration Date:   05/15/2024   All questions were answered. The patient knows to call the clinic with any problems, questions or concerns. No barriers to learning was detected. The total time spent in the appointment was 25 minutes.     Malachy Mood, MD 03/13/2023   Carolin Coy, CMA, am acting as scribe for Malachy Mood, MD.   I have reviewed the above documentation for accuracy and completeness, and I agree with the above.

## 2023-03-15 ENCOUNTER — Other Ambulatory Visit (HOSPITAL_BASED_OUTPATIENT_CLINIC_OR_DEPARTMENT_OTHER): Payer: Self-pay

## 2023-03-16 ENCOUNTER — Other Ambulatory Visit: Payer: Self-pay

## 2023-03-21 NOTE — Congregational Nurse Program (Signed)
CN office visit with interpreter [name] assisting.  Assisted patient and her husband with FNS application renewal.  Reminded her of upcoming appointments:1) 03/26/2023 @ 10:15 am for C[facility]T scan at [facility] 2) 04/02/2023 @ 9:30 [facility] 3) 04/10/2023 @ 9:45 with [facility].  CN has requested Medicaid transportation for all appointments.  [name] RN, Congregational Nurse [phone]

## 2023-03-22 ENCOUNTER — Other Ambulatory Visit (HOSPITAL_COMMUNITY): Payer: Self-pay

## 2023-03-25 ENCOUNTER — Other Ambulatory Visit: Payer: Self-pay

## 2023-03-26 ENCOUNTER — Ambulatory Visit (HOSPITAL_COMMUNITY)
Admission: RE | Admit: 2023-03-26 | Discharge: 2023-03-26 | Disposition: A | Discharge status: Home / Self Care | Payer: Medicaid Other | Source: Ambulatory Visit | Attending: Nurse Practitioner | Admitting: Nurse Practitioner

## 2023-03-26 ENCOUNTER — Encounter (HOSPITAL_COMMUNITY): Payer: Self-pay

## 2023-03-26 DIAGNOSIS — C20 Malignant neoplasm of rectum: Secondary | ICD-10-CM | POA: Insufficient documentation

## 2023-03-26 DIAGNOSIS — C7800 Secondary malignant neoplasm of unspecified lung: Secondary | ICD-10-CM | POA: Diagnosis not present

## 2023-03-26 DIAGNOSIS — I771 Stricture of artery: Secondary | ICD-10-CM | POA: Diagnosis not present

## 2023-03-26 DIAGNOSIS — I700 Atherosclerosis of aorta: Secondary | ICD-10-CM | POA: Diagnosis not present

## 2023-03-26 MED ORDER — IOHEXOL 9 MG/ML PO SOLN
500.0000 mL | ORAL | Status: AC
  Administered 2023-03-26: 1000 mL via ORAL

## 2023-03-26 MED ORDER — SODIUM CHLORIDE (PF) 0.9 % IJ SOLN
INTRAMUSCULAR | Status: AC
  Filled 2023-03-26: qty 50

## 2023-03-26 MED ORDER — IOHEXOL 9 MG/ML PO SOLN
ORAL | Status: AC
  Filled 2023-03-26: qty 1000

## 2023-03-26 MED ORDER — IOHEXOL 300 MG/ML  SOLN
75.0000 mL | Freq: Once | INTRAMUSCULAR | Status: AC | PRN
  Administered 2023-03-26: 75 mL via INTRAVENOUS

## 2023-03-27 ENCOUNTER — Telehealth: Payer: Self-pay

## 2023-03-27 NOTE — Telephone Encounter (Signed)
Phone call to Jillian Lutz to help patient obtain immunization record and determine what vaccines are needed to complete I-693 for Permanent Residence application.  She stated the only thing they have on file are 2 Covid -19 vaccines from 2022.  CN will call PCP at [facility] to request a copy of immunizations shown in EPIC.  [name] RN, Congregational nurse [phone]

## 2023-03-28 ENCOUNTER — Encounter: Payer: Medicaid Other | Admitting: Internal Medicine

## 2023-03-28 NOTE — Congregational Nurse Program (Signed)
Home visit with interpreter Truddie Crumble and Med student Lucious Groves.  Delivered patient's capecitabine which was picked up yesterday from [facility] community pharmacy.  She will resume taking it on Monday 04/02/2023.  Told patient transportation has been requested for this appointment and she needs to be ready for pick-up 8:45 am.  Also gave her a copy of immunization record obtained from [facility] and took it to [facility].who will determine what vaccines she needs to comply with I-693 form for [employer]. (Patient signed a [place] ROI allowing and requesting CN to provide this information). [name] RN, Congregational Nurse [phone]

## 2023-03-30 MED FILL — Dexamethasone Sodium Phosphate Inj 100 MG/10ML: INTRAMUSCULAR | Qty: 1 | Status: AC

## 2023-04-01 NOTE — Assessment & Plan Note (Signed)
-  Doppler on July 13, 2022 showed left acute DVT involving popliteal and peroneal vein, CTA chest was negative for PE. -continue Xarelto, tolerating well

## 2023-04-01 NOTE — Assessment & Plan Note (Signed)
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 -Despite strong recommendation, patient firmly and repeatedly declined IV chemo and surgery. She agreed to chemoRT with Xeloda, and received the treatment 01/04/21 - 02/14/21. -she developed local cancer progression and lung mets in 11/2021 -she started Xeloda on 02/16/2022, low dose oxaliplatin was added on 05/11/22 (with cycle 4 of Xeloda)  -chemo held since 07/2022 due to leg pain and recurrent nausea  -restaging CT 09/07/2022 showed stable disease in rectum and lung, no new lesions.   -she had chemo break from Feb-May 2024 -restart CAPOX with dose reduction 12/18/2022, not tolerating very well overall.  Oxaliplatin was held on August 5.  -Restaging CT scan from March 26, 2023 showed stable rectal wall thickening, no new metastasis  -due to her significant fatigue and low appetite from oxaliplatin, I will change oxaliplatin to every 6 weeks and continue Xeloda 2 weeks on and 1 week off, she agrees

## 2023-04-02 ENCOUNTER — Inpatient Hospital Stay: Payer: Medicaid Other

## 2023-04-02 ENCOUNTER — Inpatient Hospital Stay: Payer: Medicaid Other | Admitting: Nutrition

## 2023-04-02 ENCOUNTER — Encounter: Payer: Self-pay | Admitting: Hematology

## 2023-04-02 ENCOUNTER — Inpatient Hospital Stay: Payer: Medicaid Other | Attending: Physician Assistant | Admitting: Hematology

## 2023-04-02 ENCOUNTER — Other Ambulatory Visit (HOSPITAL_COMMUNITY): Payer: Self-pay

## 2023-04-02 ENCOUNTER — Inpatient Hospital Stay (HOSPITAL_BASED_OUTPATIENT_CLINIC_OR_DEPARTMENT_OTHER): Payer: Medicaid Other

## 2023-04-02 ENCOUNTER — Other Ambulatory Visit: Payer: Medicaid Other

## 2023-04-02 VITALS — BP 134/85 | HR 73 | Temp 98.1°F | Resp 16 | Ht <= 58 in | Wt 102.2 lb

## 2023-04-02 VITALS — BP 113/63 | HR 61 | Resp 18

## 2023-04-02 DIAGNOSIS — D509 Iron deficiency anemia, unspecified: Secondary | ICD-10-CM

## 2023-04-02 DIAGNOSIS — I82432 Acute embolism and thrombosis of left popliteal vein: Secondary | ICD-10-CM

## 2023-04-02 DIAGNOSIS — C7800 Secondary malignant neoplasm of unspecified lung: Secondary | ICD-10-CM | POA: Diagnosis not present

## 2023-04-02 DIAGNOSIS — Z7901 Long term (current) use of anticoagulants: Secondary | ICD-10-CM | POA: Diagnosis not present

## 2023-04-02 DIAGNOSIS — Z86718 Personal history of other venous thrombosis and embolism: Secondary | ICD-10-CM | POA: Insufficient documentation

## 2023-04-02 DIAGNOSIS — Z5111 Encounter for antineoplastic chemotherapy: Secondary | ICD-10-CM | POA: Insufficient documentation

## 2023-04-02 DIAGNOSIS — C20 Malignant neoplasm of rectum: Secondary | ICD-10-CM

## 2023-04-02 DIAGNOSIS — K6289 Other specified diseases of anus and rectum: Secondary | ICD-10-CM

## 2023-04-02 LAB — CMP (CANCER CENTER ONLY)
ALT: <5 U/L (ref 0–44)
AST: 15 U/L (ref 15–41)
Albumin: 3.5 g/dL (ref 3.5–5.0)
Alkaline Phosphatase: 68 U/L (ref 38–126)
Anion gap: 6 (ref 5–15)
BUN: 11 mg/dL (ref 8–23)
CO2: 28 mmol/L (ref 22–32)
Calcium: 8.7 mg/dL — ABNORMAL LOW (ref 8.9–10.3)
Chloride: 103 mmol/L (ref 98–111)
Creatinine: 0.8 mg/dL (ref 0.44–1.00)
GFR, Estimated: >60 mL/min (ref >60)
Glucose, Bld: 161 mg/dL — ABNORMAL HIGH (ref 70–99)
Potassium: 3.9 mmol/L (ref 3.5–5.1)
Sodium: 137 mmol/L (ref 135–145)
Total Bilirubin: 0.4 mg/dL (ref 0.3–1.2)
Total Protein: 7.1 g/dL (ref 6.5–8.1)

## 2023-04-02 LAB — CBC WITH DIFFERENTIAL (CANCER CENTER ONLY)
Abs Immature Granulocytes: 0.01 10*3/uL (ref 0.00–0.07)
Basophils Absolute: 0 10*3/uL (ref 0.0–0.1)
Basophils Relative: 1 %
Eosinophils Absolute: 0.1 10*3/uL (ref 0.0–0.5)
Eosinophils Relative: 3 %
HCT: 32.2 % — ABNORMAL LOW (ref 36.0–46.0)
Hemoglobin: 10.6 g/dL — ABNORMAL LOW (ref 12.0–15.0)
Immature Granulocytes: 0 %
Lymphocytes Relative: 24 %
Lymphs Abs: 1 10*3/uL (ref 0.7–4.0)
MCH: 25.4 pg — ABNORMAL LOW (ref 26.0–34.0)
MCHC: 32.9 g/dL (ref 30.0–36.0)
MCV: 77 fL — ABNORMAL LOW (ref 80.0–100.0)
Monocytes Absolute: 0.6 10*3/uL (ref 0.1–1.0)
Monocytes Relative: 14 %
Neutro Abs: 2.4 10*3/uL (ref 1.7–7.7)
Neutrophils Relative %: 58 %
Platelet Count: 261 10*3/uL (ref 150–400)
RBC: 4.18 MIL/uL (ref 3.87–5.11)
RDW: 18.1 % — ABNORMAL HIGH (ref 11.5–15.5)
WBC Count: 4.1 10*3/uL (ref 4.0–10.5)
nRBC: 0 % (ref 0.0–0.2)

## 2023-04-02 LAB — FERRITIN: Ferritin: 50 ng/mL (ref 11–307)

## 2023-04-02 LAB — TOTAL PROTEIN, URINE DIPSTICK: Protein, ur: NEGATIVE mg/dL

## 2023-04-02 MED ORDER — SODIUM CHLORIDE 0.9 % IV SOLN: Freq: Once | INTRAVENOUS | Status: AC

## 2023-04-02 MED ORDER — DEXTROSE 5 % IV SOLN: Freq: Once | INTRAVENOUS | Status: AC

## 2023-04-02 MED ORDER — TRAMADOL HCL 50 MG PO TABS
50.0000 mg | ORAL_TABLET | Freq: Three times a day (TID) | ORAL | 0 refills | Status: DC | PRN
Start: 2023-04-02 — End: 2023-06-05
  Filled 2023-04-02: qty 60, 20d supply, fill #0

## 2023-04-02 MED ORDER — SODIUM CHLORIDE 0.9 % IV SOLN
10.0000 mg | Freq: Once | INTRAVENOUS | Status: AC
  Administered 2023-04-02: 10 mg via INTRAVENOUS
  Filled 2023-04-02: qty 10

## 2023-04-02 MED ORDER — SODIUM CHLORIDE 0.9% FLUSH: 10.0000 mL | INTRAVENOUS | Status: DC | PRN

## 2023-04-02 MED ORDER — HEPARIN SOD (PORK) LOCK FLUSH 100 UNIT/ML IV SOLN: 500.0000 [IU] | Freq: Once | INTRAVENOUS | Status: DC | PRN

## 2023-04-02 MED ORDER — OXALIPLATIN CHEMO INJECTION 100 MG/20ML
70.0000 mg/m2 | Freq: Once | INTRAVENOUS | Status: AC
  Administered 2023-04-02: 100 mg via INTRAVENOUS
  Filled 2023-04-02: qty 20

## 2023-04-02 MED ORDER — FAMOTIDINE IN NACL 20-0.9 MG/50ML-% IV SOLN
20.0000 mg | Freq: Once | INTRAVENOUS | Status: AC
  Administered 2023-04-02: 20 mg via INTRAVENOUS
  Filled 2023-04-02: qty 50

## 2023-04-02 MED ORDER — SODIUM CHLORIDE 0.9 % IV SOLN
7.5000 mg/kg | Freq: Once | INTRAVENOUS | Status: AC
  Administered 2023-04-02: 350 mg via INTRAVENOUS
  Filled 2023-04-02: qty 14

## 2023-04-02 MED ORDER — DIPHENHYDRAMINE HCL 50 MG/ML IJ SOLN
25.0000 mg | Freq: Once | INTRAMUSCULAR | Status: AC
  Administered 2023-04-02: 25 mg via INTRAVENOUS
  Filled 2023-04-02: qty 1

## 2023-04-02 MED ORDER — PALONOSETRON HCL INJECTION 0.25 MG/5ML
0.2500 mg | Freq: Once | INTRAVENOUS | Status: AC
  Administered 2023-04-02: 0.25 mg via INTRAVENOUS
  Filled 2023-04-02: qty 5

## 2023-04-02 NOTE — Patient Instructions (Signed)
[place] [facility]  Discharge Instructions: Thank you for choosing [facility] to provide your oncology and hematology care.   If you have a lab appointment with the Cancer Center, please go directly to the Cancer Center and check in at the registration area.   Wear comfortable clothing and clothing appropriate for easy access to any Portacath or PICC line.   We strive to give you quality time with your provider. You may need to reschedule your appointment if you arrive late (15 or more minutes).  Arriving late affects you and other patients whose appointments are after yours.  Also, if you miss three or more appointments without notifying the office, you may be dismissed from the clinic at the provider's discretion.      For prescription refill requests, have your pharmacy contact our office and allow 72 hours for refills to be completed.    Today you received the following chemotherapy and/or immunotherapy agents Bevacizumab, Oxaliplatin      To help prevent nausea and vomiting after your treatment, we encourage you to take your nausea medication as directed.  BELOW ARE SYMPTOMS THAT SHOULD BE REPORTED IMMEDIATELY: *FEVER GREATER THAN 100.4 F (38 C) OR HIGHER *CHILLS OR SWEATING *NAUSEA AND VOMITING THAT IS NOT CONTROLLED WITH YOUR NAUSEA MEDICATION *UNUSUAL SHORTNESS OF BREATH *UNUSUAL BRUISING OR BLEEDING *URINARY PROBLEMS (pain or burning when urinating, or frequent urination) *BOWEL PROBLEMS (unusual diarrhea, constipation, pain near the anus) TENDERNESS IN MOUTH AND THROAT WITH OR WITHOUT PRESENCE OF ULCERS (sore throat, sores in mouth, or a toothache) UNUSUAL RASH, SWELLING OR PAIN  UNUSUAL VAGINAL DISCHARGE OR ITCHING   Items with * indicate a potential emergency and should be followed up as soon as possible or go to the Emergency Department if any problems should occur.  Please show the CHEMOTHERAPY ALERT CARD or IMMUNOTHERAPY ALERT  CARD at check-in to the Emergency Department and triage nurse.  Should you have questions after your visit or need to cancel or reschedule your appointment, please contact [place] [facility]  Dept: [phone]  and follow the prompts.  Office hours are 8:00 a.m. to 4:30 p.m. Monday - Friday. Please note that voicemails left after 4:00 p.m. may not be returned until the following business day.  We are closed weekends and major holidays. You have access to a nurse at all times for urgent questions. Please call the main number to the clinic Dept: [phone] and follow the prompts.   For any non-urgent questions, you may also contact your provider using MyChart. We now offer e-Visits for anyone 9 and older to request care online for non-urgent symptoms. For details visit [email or web address].   Also download the MyChart app! Go to the app store, search "MyChart", open the app, select [place], and log in with your MyChart username and password.

## 2023-04-02 NOTE — Progress Notes (Signed)
Brief nutrition follow up completed with patient using interpreter.   Weight stable at 102 pounds 3.2 oz from 103 pounds 14.4 oz on August 27.  Labs noted: Glucose 161.  Patient with flat affect. She reports poor appetite continues. She has nausea and vomiting. She denies constipation and reports a BM every 2-3 days. She is drinking 3 cartons of Ensure Plus or equivalent daily. She likes to drink it with a "cookie" but nothing sweet.  MD changes her chemo plan from q 3 weeks to q 6 weeks for IV chemo.  Nutrition Diagnosis: Food and Nutrition Related Knowledge Deficit, improved.  Intervention: Continue ONS 3-4 cartons daily. Try to eat small amounts more often without unnecessary restrictions. Provided coupons and samples. Continue anti-emetics as prescribed. Continue bowel regimen.  Monitoring, Evaluation, Goals: Patient will increase calories and protein to minimize weight loss.  No follow up scheduled. Patient has good understanding but cannot optimize nutrition secondary to side effects. Will monitor for continued weight loss.

## 2023-04-02 NOTE — Assessment & Plan Note (Signed)
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 -Despite strong recommendation, patient firmly and repeatedly declined IV chemo and surgery. She agreed to chemoRT with Xeloda, and received the treatment 01/04/21 - 02/14/21. -she developed local cancer progression and lung mets in 11/2021 -she started Xeloda on 02/16/2022, low dose oxaliplatin was added on 05/11/22 (with cycle 4 of Xeloda)  -chemo held since 07/2022 due to leg pain and recurrent nausea  -restaging CT 09/07/2022 showed stable disease in rectum and lung, no new lesions.   -she had chemo break from Feb-May 2024 -restart CAPOX with dose reduction 12/18/2022, not tolerating very well overall.  Oxaliplatin was held on August 5.  -Restaging CT scan from March 26, 2023 showed stable rectal wall thickening, no other  evidence of metastasis

## 2023-04-02 NOTE — Assessment & Plan Note (Signed)
-  Doppler on July 13, 2022 showed left acute DVT involving popliteal and peroneal vein, CTA chest was negative for PE. -continue Xarelto, tolerating well

## 2023-04-02 NOTE — Progress Notes (Signed)
[facility]   Telephone:([phone] [phone]   Clinic Follow up Note   Patient Care Team: [name], MD as PCP - General [name], RN (Inactive) as Oncology Nurse Navigator [name], NP as Nurse Practitioner (Nurse Practitioner) Malachy Mood, MD as Consulting Physician (Hematology and Oncology) Dorothy Puffer, MD as Consulting Physician (Radiation Oncology) [name], Netty Starring., MD as Consulting Physician (Gastroenterology) Josephine Igo, DO as Consulting Physician (Pulmonary Disease) Karie Soda, MD as Consulting Physician (General Surgery)  Date of Service:  04/02/2023  CHIEF COMPLAINT: f/u of rectal cancer   CURRENT THERAPY:    Xeloda 1000 mg twice daily for 14 days then 7 days off. CapeOx at reduced dose every 3 weeks  ASSESSMENT:  Jillian Lutz is a 72 y.o. female with   Left leg DVT (HCC) -Doppler on July 13, 2022 showed left acute DVT involving popliteal and peroneal vein, CTA chest was negative for PE. -continue Xarelto, tolerating well    Rectal adenocarcinoma (HCC) cT3cN1M0 stage IIIb, biopsy confirmed residual primary tumor and oligo lung met in 09/2021 -Initially diagnosed 11/2020 by colonoscopy for progressive rectal pain and bleeding, weight loss, and constipation. -Despite strong recommendation, patient firmly and repeatedly declined IV chemo and surgery. She agreed to chemoRT with Xeloda, and received the treatment 01/04/21 - 02/14/21. -she developed local cancer progression and lung mets in 11/2021 -she started Xeloda on 02/16/2022, low dose oxaliplatin was added on 05/11/22 (with cycle 4 of Xeloda)  -chemo held since 07/2022 due to leg pain and recurrent nausea  -restaging CT 09/07/2022 showed stable disease in rectum and lung, no new lesions.   -she had chemo break from Feb-May 2024 -restart CAPOX with dose reduction 12/18/2022, not tolerating very well overall.  Oxaliplatin was held on August 5.  -Restaging CT  scan from March 26, 2023 showed stable rectal wall thickening, no new metastasis  -due to her significant fatigue and low appetite from oxaliplatin, I will change oxaliplatin to every 6 weeks and continue Xeloda 2 weeks on and 1 week off, she agrees    PLAN: -lab reviewed -CMP pending -I after today treatment I will change plan from q3 weeks to q6 weeks for the Intravenous chemo. - I refill Tamadol for pain. - I reviewed the CT scan from 03/26/23 -Cancel treatment  in 3 weeks but keep the lab and f/u appointment 10/8   SUMMARY OF ONCOLOGIC HISTORY: Oncology History Overview Note  Cancer Staging Rectal adenocarcinoma [facility]) Staging form: Colon and Rectum, AJCC 8th Edition - Clinical stage from 12/21/2020: Stage IIIB (cT3, cN1, cM0) - Unsigned    Rectal adenocarcinoma (HCC)  08/26/2020 Miscellaneous   Initial presentation to PCP, reporting intermittent BRBPR since 02/2020    12/01/2020 Procedure   Colonoscopy by Dr. Lavon Paganini findings - The perianal and digital rectal examinations were normal. - A 15 mm polyp was found in the ascending colon. The polyp was semi-pedunculated. Resection and retrieval were complete. - A 25 mm polyp was found in the sigmoid colon. The polyp was pedunculated. Resection and retrieval were complete. - An infiltrative partially obstructing large mass was found in the proximal rectum. The mass was partially circumferential (involving one-half of the lumen circumference). The mass measured eight cm in length extending from 10 to 18cm from anal verge. This was biopsied with a cold forceps for histology. Proximal and distal opposite fold area of the mass lesion was tattooed with an injection of total 3 mL of Spot (carbon black).  12/01/2020 Initial Biopsy   Diagnosis 1. Colon, polyp(s), ascending x 1 - ADENOCARCINOMA ARISING IN A TUBULAR ADENOMA WITH HIGH-GRADE DYSPLASIA. SEE NOTE 2. Colon, sigmoid polyp, x1 - TUBULOVILLOUS ADENOMA(S) - NEGATIVE FOR HIGH-GRADE  DYSPLASIA OR MALIGNANCY 3. Rectum, biopsy - ADENOCARCINOMA. SEE NOTE   12/01/2020 Cancer Staging   Cancer Staging Rectal adenocarcinoma [facility]) Staging form: Colon and Rectum, AJCC 8th Edition - Clinical stage from 12/21/2020: Stage IIIB (cT3, cN1, cM0) - Unsigned    12/06/2020 Imaging   CT CAP with contrast IMPRESSION: 1. There is partially circumferential soft tissue thickening of the mid to superior rectum, approximately 5 cm in length and the inferior extent approximately 6 cm above the anal verge, consistent with primary rectal malignancy identified by colonoscopy. 2. There appear to be abnormally enlarged perirectal lymph nodes posteriorly about the superior rectum measuring up to 1.0 x 0.8 cm. Findings are suspicious for perirectal nodal metastatic disease, however rectal MRI is the test of choice for initial local staging of rectal cancer. 3. There is a 4 mm nonspecific pulmonary nodule of the superior segment left lower lobe, statistically most likely incidental, infectious or inflammatory, although nonspecific and isolated metastatic disease is not strictly excluded. Attention on follow-up. 4. No other evidence of metastatic disease in the chest, abdomen, or pelvis. Aortic Atherosclerosis (ICD10-I70.0).   12/09/2020 Imaging   Local staging MRI pelvis without contrast IMPRESSION: 4.9 cm circumferential mid/lower rectal mass, corresponding to the patient's newly diagnosed rectal cancer. Rectal adenocarcinoma T stage: T3c Rectal adenocarcinoma N stage:  N1 Distance from tumor to the internal anal sphincter is 3.7 cm.   12/21/2020 Initial Diagnosis   Rectal adenocarcinoma (HCC)   01/04/2021 -  Chemotherapy   Concurrent chemoradiation with Xeloda 1000mg  in the AM and 1500mg  in the PM on days of Radiation starting 01/04/21.       01/04/2021 - 02/11/2021 Radiation Therapy   Concurrent chemoradiation with Dr Mitzi Hansen and Xeloda starting 01/04/21.    01/31/2022 Procedure    Colonoscopy, Dr. Lavon Paganini  Findings: - An infiltrative partially obstructing large mass was found in the rectum. The mass was circumferential. The mass measured ten cm in length, extending from 5-15cm from anal verge. In addition, rectal diameter at narrowest approx twelve mm. Oozing was present. Biopsies were taken with a cold forceps for histology.  Impression: - Hemorrhoids found on perianal exam. - Likely malignant partially obstructing tumor in the rectum. Biopsied. - Non-bleeding external and internal hemorrhoids.   01/31/2022 Pathology Results   Diagnosis Rectum, biopsy INVASIVE MODERATELY DIFFERENTIATED ADENOCARCINOMA   05/11/2022 -  Chemotherapy   Patient is on Treatment Plan : COLORECTAL Xelox (Capeox)(130/850) q21d     09/07/2022 Imaging    IMPRESSION: 1. No significant change in circumferential wall thickening of the rectum, in keeping with known primary rectal mass. Similar appearance of post treatment perirectal and presacral fat stranding and fascial thickening 2. Unchanged nodule of the superior segment left lower lobe with adjacent bandlike scarring. No new nodules. 3. No evidence of lymphadenopathy or other metastatic disease in the chest, abdomen, or pelvis. 4. Large burden of stool throughout the colon. 5. Cardiomegaly.   12/08/2022 Imaging    IMPRESSION: Persistent wall thickening along the rectum with adjacent severe inflammatory stranding and nodularity. Please correlate with exact location of the distribution of the patient's neoplasm.   No discrete new mass lesion, fluid collection or lymph node enlargement.   Improved visualization of the fractures involving the left side of the pubic symphysis as well as  probable insufficiency fractures along the sacrum. Associated areas of increasing heterogeneous bony sclerosis along the pubic bones and sacrum. In principle sclerotic bone metastases would be in the differential but are felt to be less likely  based on the overall appearance. If needed confirmatory MRI can be considered as clinically appropriate to further delineate.   Stable scarring and fibrotic changes along the lungs with a tiny nodular area along the superior segment of the left lower lobe. Simple continued follow up.   Enlarged heart.      INTERVAL HISTORY:  Jillian Lutz is here for a follow up of rectal cancer. She was last seen by me on 03/13/2023. She presents to the clinic accompanied by interpreter. Pt state that last chemo cycle, she lost her appetite.She reports that it her appetite is coming back. She is taking the oral chemo Xeloda. Pt take Tramadol  2x daily for pain.     All other systems were reviewed with the patient and are negative.  MEDICAL HISTORY:  Past Medical History:  Diagnosis Date   Bilateral wrist pain 10/25/2017   Hypertension    Left leg DVT (deep venous thrombosis) (HCC) 06/2022   Neck mass 1998   Unknown biopsy results. Excised in [place].   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   Rectal cancer (HCC)    Trigger finger, acquired 02/08/2017    SURGICAL HISTORY: Past Surgical History:  Procedure Laterality Date   BRONCHIAL BIOPSY  12/13/2021   Procedure: BRONCHIAL BIOPSIES;  Surgeon: Josephine Igo, DO;  Location: [facility];  Service: Pulmonary;;   BRONCHIAL NEEDLE ASPIRATION BIOPSY  12/13/2021   Procedure: BRONCHIAL NEEDLE ASPIRATION BIOPSIES;  Surgeon: Josephine Igo, DO;  Location: [facility] ENDOSCOPY;  Service: Pulmonary;;   NECK SURGERY Left 1998   Excision of mass in [place]   VIDEO BRONCHOSCOPY WITH RADIAL ENDOBRONCHIAL ULTRASOUND  12/13/2021   Procedure: RADIAL ENDOBRONCHIAL ULTRASOUND;  Surgeon: Josephine Igo, DO;  Location: [facility]OS[facility];  Service: Pulmonary;;    I have reviewed the social history and family history with the patient and they are unchanged from previous note.  ALLERGIES:  is allergic to no known allergies.  MEDICATIONS:   Current Outpatient Medications  Medication Sig Dispense Refill   amLODipine (NORVASC) 5 MG tablet Take 1 tablet (5 mg total) by mouth daily. 90 tablet 3   brimonidine (ALPHAGAN) 0.2 % ophthalmic solution Place 1 drop into the right eye 2 (two) times daily. 15 mL 3   brimonidine (ALPHAGAN) 0.2 % ophthalmic solution Place 1 drop into the right eye 2 (two) times daily. 10 mL 11   capecitabine (XELODA) 500 MG tablet Take 2 tabs every 12 hours, for 14 days then off for 7 days. Take after meal 56 tablet 2   dorzolamide-timolol (COSOPT) 2-0.5 % ophthalmic solution Place 1 drop into both eyes 2 times daily. 30 mL 3   dorzolamide-timolol (COSOPT) 2-0.5 % ophthalmic solution Instill 1 drop into both eyes twice a day 10 mL 11   gabapentin (NEURONTIN) 100 MG capsule Take 1 capsule (100 mg total) by mouth 3 (three) times daily as needed. 90 capsule 2   latanoprost (XALATAN) 0.005 % ophthalmic solution Instill 1 drop into both eyes every night at bedtime 7.5 mL 3   latanoprost (XALATAN) 0.005 % ophthalmic solution Place 1 drop into both eyes at bedtime. 7.5 mL 6   lidocaine (XYLOCAINE) 5 % ointment Apply to anal area daily as needed. 50 g 1  lisinopril-hydrochlorothiazide (ZESTORETIC) 20-12.5 MG tablet Take 2 tablets by mouth daily. 60 tablet 3   mirtazapine (REMERON) 30 MG tablet Take 1 tablet (30 mg total) by mouth at bedtime. 30 tablet 1   Multiple Vitamins-Minerals (MULTIVITAMIN WITH MINERALS) tablet Take 1 tablet by mouth daily. Gummy 50 mg     ondansetron (ZOFRAN) 8 MG tablet Take 1 tablet (8 mg total) by mouth every 8 (eight) hours as needed for nausea or vomiting. Take after 3 days of chemo 30 tablet 2   polyethylene glycol powder (GLYCOLAX/MIRALAX) 17 GM/SCOOP powder Take 1 Container by mouth once.     prochlorperazine (COMPAZINE) 10 MG tablet Take 1 tablet (10 mg total) by mouth every 6 (six) hours as needed. 30 tablet 2   rivaroxaban (XARELTO) 20 MG TABS tablet Take 1 tablet (20 mg total) by  mouth daily with supper. 30 tablet 2   traMADol (ULTRAM) 50 MG tablet Take 1 tablet (50 mg total) by mouth every 8 (eight) hours as needed. 60 tablet 0   urea (CARMOL) 10 % cream Apply topically 2 times daily as needed. 85 g 3   No current facility-administered medications for this visit.   Facility-Administered Medications Ordered in Other Visits  Medication Dose Route Frequency Provider Last Rate Last Admin   heparin lock flush 100 unit/mL  500 Units Intracatheter Once PRN Malachy Mood, MD       oxaliplatin (ELOXATIN) 100 mg in dextrose 5 % 500 mL chemo infusion  70 mg/m2 (Treatment Plan Recorded) Intravenous Once Malachy Mood, MD 260 mL/hr at 04/02/23 1233 100 mg at 04/02/23 1233   sodium chloride flush (NS) 0.9 % injection 10 mL  10 mL Intracatheter PRN Malachy Mood, MD        PHYSICAL EXAMINATION: ECOG PERFORMANCE STATUS: 2 - Symptomatic, <50% confined to bed   Vitals:   04/02/23 0935  BP: 134/85  Pulse: 73  Resp: 16  Temp: 98.1 F (36.7 C)  SpO2: 100%   Wt Readings from Last 3 Encounters:  04/02/23 102 lb 3.2 oz (46.4 kg)  03/13/23 103 lb 14.4 oz (47.1 kg)  02/19/23 101 lb 6.4 oz (46 kg)     GENERAL:alert, no distress and comfortable SKIN: skin color normal, no rashes or significant lesions EYES: normal, Conjunctiva are pink and non-injected, sclera clear  NEURO: alert & oriented x 3 with fluent speech  LABORATORY DATA:  I have reviewed the data as listed    Latest Ref Rng & Units 04/02/2023    9:13 AM 03/13/2023   10:11 AM 02/19/2023   10:30 AM  CBC  WBC 4.0 - 10.5 K/uL 4.1  4.8  3.8   Hemoglobin 12.0 - 15.0 g/dL 81.1  91.4  78.2   Hematocrit 36.0 - 46.0 % 32.2  34.2  32.7   Platelets 150 - 400 K/uL 261  243  291         Latest Ref Rng & Units 04/02/2023    9:13 AM 03/13/2023   10:11 AM 02/19/2023   10:30 AM  CMP  Glucose 70 - 99 mg/dL 956  94  213   BUN 8 - 23 mg/dL 11  11  11    Creatinine 0.44 - 1.00 mg/dL 0.86  5.78  4.69   Sodium 135 - 145 mmol/L 137  137  139    Potassium 3.5 - 5.1 mmol/L 3.9  3.9  4.0   Chloride 98 - 111 mmol/L 103  103  105   CO2 22 - 32 mmol/L  28  27  28    Calcium 8.9 - 10.3 mg/dL 8.7  8.9  8.9   Total Protein 6.5 - 8.1 g/dL 7.1  7.3  6.8   Total Bilirubin 0.3 - 1.2 mg/dL 0.4  0.4  0.4   Alkaline Phos 38 - 126 U/L 68  68  64   AST 15 - 41 U/L 15  16  16    ALT 0 - 44 U/L <5  <5  <5       RADIOGRAPHIC STUDIES: I have personally reviewed the radiological images as listed and agreed with the findings in the report. No results found.    No orders of the defined types were placed in this encounter.  All questions were answered. The patient knows to call the clinic with any problems, questions or concerns. No barriers to learning was detected. The total time spent in the appointment was 30 minutes.     Malachy Mood, MD 04/02/2023   Carolin Coy, CMA, am acting as scribe for Malachy Mood, MD.   I have reviewed the above documentation for accuracy and completeness, and I agree with the above.

## 2023-04-09 ENCOUNTER — Other Ambulatory Visit (HOSPITAL_COMMUNITY): Payer: Self-pay

## 2023-04-10 ENCOUNTER — Ambulatory Visit: Payer: Medicaid Other | Admitting: Student

## 2023-04-10 ENCOUNTER — Other Ambulatory Visit (HOSPITAL_COMMUNITY): Payer: Self-pay

## 2023-04-10 ENCOUNTER — Encounter: Payer: Medicaid Other | Admitting: Student

## 2023-04-10 VITALS — BP 138/72 | HR 66 | Temp 98.0°F | Ht <= 58 in | Wt 99.2 lb

## 2023-04-10 DIAGNOSIS — I10 Essential (primary) hypertension: Secondary | ICD-10-CM

## 2023-04-10 DIAGNOSIS — Z23 Encounter for immunization: Secondary | ICD-10-CM | POA: Diagnosis present

## 2023-04-10 DIAGNOSIS — K5903 Drug induced constipation: Secondary | ICD-10-CM | POA: Diagnosis not present

## 2023-04-10 DIAGNOSIS — C20 Malignant neoplasm of rectum: Secondary | ICD-10-CM | POA: Diagnosis not present

## 2023-04-10 MED ORDER — COVID-19 MRNA VAC-TRIS(PFIZER) 30 MCG/0.3ML IM SUSY
0.3000 mL | PREFILLED_SYRINGE | Freq: Once | INTRAMUSCULAR | 0 refills | Status: AC
  Filled 2023-04-10: qty 0.3, 1d supply, fill #0

## 2023-04-10 NOTE — Assessment & Plan Note (Addendum)
Patient requesting immunization for Polio, Varicella, Flu and Covid. Unfortunately we only offer the flu vaccine in clinic. She should be able to attain Covid and Varicella at most pharmacies, but Polio may be a bit more difficult. She has an appointment with the health department set up for this, but it is relatively far out. She is followed by the [facility] who take care of many of her healthcare needs.   We were hoping she would be able to pick up her Tramadol prescription from Dr. Mosetta Putt at the community pharmacy today and get some of her additional vaccines today, but she was unable to make it there d/t transportation issues.  Plan:  - Flu vaccine today - I have reached out to the Congregational Nurse following this patient to discuss our visit today

## 2023-04-10 NOTE — Assessment & Plan Note (Signed)
Follows closely with Dr. Mosetta Putt in Oncology who manages many of her medications. Metastatic disease with met to the lung, stable. Currently undergoing chemotherapy.

## 2023-04-10 NOTE — Assessment & Plan Note (Signed)
On Senna and Miralax currently. Endorses normal consistency stools every three days. Will continue current regiment.

## 2023-04-10 NOTE — Patient Instructions (Addendum)
Thank you, [name] for allowing [place] to provide your care today. Today we discussed Vaccines.    Will be able to get the flu vaccine today Please got to the [facility] pharmacy to pick up your Tramadol. This is across the street.  - They may also be able to give you the other vaccines you need - Polio, varicella, Covid     Follow up: 3 months   We look forward to seeing you next time. Please call our clinic at [phone] if you have any questions or concerns. The best time to call is Monday-Friday from 9am-4pm, but there is someone available 24/7. If after hours or the weekend, call the main hospital number and ask for the Internal Medicine Resident On-Call. If you need medication refills, please notify your pharmacy one week in advance and they will send [place] a request.   Thank you for trusting me with your care. Wishing you the best!  [name] MD [facility]

## 2023-04-10 NOTE — Assessment & Plan Note (Signed)
Well controlled on current regiment on Amlodipine 5mg .   Plan:  - Continue current regiment

## 2023-04-10 NOTE — Progress Notes (Signed)
Subjective:  CC: Immunization, Routine Follow up  HPI:  [name] is a 72 y.o. female with a past medical history stated below and presents today for immunization and routine follow up. Please see problem based assessment and plan for additional details.  Past Medical History:  Diagnosis Date   Bilateral wrist pain 10/25/2017   Hypertension    Left leg DVT (deep venous thrombosis) (HCC) 06/2022   Neck mass 1998   Unknown biopsy results. Excised in [place].   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 90 tablet 3   brimonidine (ALPHAGAN) 0.2 % ophthalmic solution Place 1 drop into the right eye 2 (two) times daily. 15 mL 3   brimonidine (ALPHAGAN) 0.2 % ophthalmic solution Place 1 drop into the right eye 2 (two) times daily. 10 mL 11   capecitabine (XELODA) 500 MG tablet Take 2 tabs every 12 hours, for 14 days then off for 7 days. Take after meal 56 tablet 2   dorzolamide-timolol (COSOPT) 2-0.5 % ophthalmic solution Place 1 drop into both eyes 2 times daily. 30 mL 3   dorzolamide-timolol (COSOPT) 2-0.5 % ophthalmic solution Instill 1 drop into both eyes twice a day 10 mL 11   gabapentin (NEURONTIN) 100 MG capsule Take 1 capsule (100 mg total) by mouth 3 (three) times daily as needed. 90 capsule 2   latanoprost (XALATAN) 0.005 % ophthalmic solution Instill 1 drop into both eyes every night at bedtime 7.5 mL 3   latanoprost (XALATAN) 0.005 % ophthalmic solution Place 1 drop into both eyes at bedtime. 7.5 mL 6   lidocaine (XYLOCAINE) 5 % ointment Apply to anal area daily as needed. 50 g 1   lisinopril-hydrochlorothiazide (ZESTORETIC) 20-12.5 MG tablet Take 2 tablets by mouth daily. 60 tablet 3   mirtazapine (REMERON)  30 MG tablet Take 1 tablet (30 mg total) by mouth at bedtime. 30 tablet 1   Multiple Vitamins-Minerals (MULTIVITAMIN WITH MINERALS) tablet Take 1 tablet by mouth daily. Gummy 50 mg     ondansetron (ZOFRAN) 8 MG tablet Take 1 tablet (8 mg total) by mouth every 8 (eight) hours as needed for nausea or vomiting. Take after 3 days of chemo 30 tablet 2   polyethylene glycol powder (GLYCOLAX/MIRALAX) 17 GM/SCOOP powder Take 1 Container by mouth once.     prochlorperazine (COMPAZINE) 10 MG tablet Take 1 tablet (10 mg total) by mouth every 6 (six) hours as needed. 30 tablet 2   rivaroxaban (XARELTO) 20 MG TABS tablet Take 1 tablet (20 mg total) by mouth daily with supper. 30 tablet 2   traMADol (ULTRAM) 50 MG tablet Take 1 tablet (50 mg total) by mouth every 8 (eight) hours as needed. 60 tablet 0   urea (CARMOL) 10 % cream Apply topically 2 times daily as needed. 85 g 3   No current facility-administered medications on file prior to visit.    Review of Systems: ROS negative except for what is noted on the assessment and plan.  Objective:   Vitals:   04/10/23 0959  BP: 138/72  Pulse: 66  Temp: 98 F (36.7 C)  TempSrc: Oral  SpO2: 99%  Weight: 99 lb 3.2 oz (45 kg)  Height: 4\' 10"  (  1.473 m)    Physical Exam: Constitutional: Frail appearing, in no acute distress HENT: normocephalic atraumatic, mucous membranes moist Cardiovascular: regular rate and rhythm, no m/r/g Pulmonary/Chest: normal work of breathing on room air, lungs clear to auscultation bilaterally Abdominal: soft, non-tender, non-distended Psych: Pleasant mood and affect     Assessment & Plan:  Encounter for immunization Patient requesting immunization for Polio, Varicella, Flu and Covid. Unfortunately we only offer the flu vaccine in clinic. She should be able to attain Covid and Varicella at most pharmacies, but Polio may be a bit more difficult. She has an appointment with the health department set up for this, but it is  relatively far out. She is followed by the [facility] who take care of many of her healthcare needs.   We were hoping she would be able to pick up her Tramadol prescription from Dr. Mosetta Putt at the community pharmacy today and get some of her additional vaccines today, but she was unable to make it there d/t transportation issues.  Plan:  - Flu vaccine today - I have reached out to the [facility]reg[facility] Nurse following this patient to discuss our visit today  Rectal adenocarcinoma (HCC) Follows closely with Dr. Mosetta Putt in Oncology who manages many of her medications. Metastatic disease with met to the lung, stable. Currently undergoing chemotherapy.   Essential hypertension Well controlled on current regiment on Amlodipine 5mg .   Plan:  - Continue current regiment  Constipation On Senna and Miralax currently. Endorses normal consistency stools every three days. Will continue current regiment.     Patient seen with Dr. Elliot Cousin MD [facility]  PGY-1 Pager: [phone]  Phone: [phone] Date 04/10/2023  Time 2:54 PM

## 2023-04-11 ENCOUNTER — Other Ambulatory Visit (HOSPITAL_COMMUNITY): Payer: Self-pay

## 2023-04-11 ENCOUNTER — Encounter: Payer: Self-pay | Admitting: Hematology

## 2023-04-13 ENCOUNTER — Telehealth: Payer: Self-pay

## 2023-04-13 ENCOUNTER — Other Ambulatory Visit: Payer: Self-pay

## 2023-04-13 NOTE — Progress Notes (Signed)
Specialty Pharmacy Refill Coordination Note  Libbie Witman is a 72 y.o. female contacted today regarding refills of specialty medication(s) Capecitabine .  Patient requested [name] at [facility] at [place]  on 04/17/23   Medication will be filled on 04/16/23.

## 2023-04-13 NOTE — Telephone Encounter (Signed)
Informed patient that CN scheduled appointment at the [facility] [address] [place] for immunizations needed for I-[address]. It is on Wednesday 04/18/2023 at 9:30 am.  Medicaid transportation  has been requested and she should be ready at 8:45 am that morning.  Told her to take all previous immunization records, ID, insurance card and letter from Dr. Mosetta Putt exempting her from live vaccines such as Varicella.  (This letter was mailed to patient today).  Told her that interpreter Diu Marcelline Mates will call her later to go over this information and answer any questions.  [name] RN, Congregational Nurse [phone]

## 2023-04-16 ENCOUNTER — Other Ambulatory Visit (HOSPITAL_COMMUNITY): Payer: Self-pay

## 2023-04-18 DIAGNOSIS — Z23 Encounter for immunization: Secondary | ICD-10-CM | POA: Diagnosis not present

## 2023-04-20 ENCOUNTER — Other Ambulatory Visit: Payer: Self-pay

## 2023-04-23 NOTE — Assessment & Plan Note (Deleted)
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 -Despite strong recommendation, patient firmly and repeatedly declined IV chemo and surgery. She agreed to chemoRT with Xeloda, and received the treatment 01/04/21 - 02/14/21. -she developed local cancer progression and lung mets in 11/2021 -she started Xeloda on 02/16/2022, low dose oxaliplatin was added on 05/11/22 (with cycle 4 of Xeloda)  -chemo held since 07/2022 due to leg pain and recurrent nausea  -restaging CT 09/07/2022 showed stable disease in rectum and lung, no new lesions.   -she had chemo break from Feb-May 2024 -restart CAPOX with dose reduction 12/18/2022, not tolerating very well overall.  Oxaliplatin was held on August 5.  -Restaging CT scan from March 26, 2023 showed stable rectal wall thickening, no new metastasis  -due to her significant fatigue and low appetite from oxaliplatin, Oxaliplatin changed to every 6 weeks and continue Xeloda 2 weeks on and 1 week off, she agrees.

## 2023-04-23 NOTE — Progress Notes (Deleted)
Patient Care Team: Jillian Macadamia, MD as PCP - General Jillian Ricker, RN (Inactive) as Oncology Nurse Navigator Jillian Samples, NP as Nurse Practitioner (Nurse Practitioner) Jillian Mood, MD as Consulting Physician (Hematology and Oncology) Jillian Puffer, MD as Consulting Physician (Radiation Oncology) Jillian Lutz, Jillian Starring., MD as Consulting Physician (Gastroenterology) Jillian Igo, DO as Consulting Physician (Pulmonary Disease) Jillian Soda, MD as Consulting Physician (General Surgery)  Clinic Day:  04/23/2023  Referring physician: Malachy Mood, MD  ASSESSMENT & PLAN:   Assessment & Plan: 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 -Despite strong recommendation, patient firmly and repeatedly declined IV chemo and surgery. She agreed to chemoRT with Xeloda, and received the treatment 01/04/21 - 02/14/21. -she developed local cancer progression and lung mets in 11/2021 -she started Xeloda on 02/16/2022, low dose oxaliplatin was added on 05/11/22 (with cycle 4 of Xeloda)  -chemo held since 07/2022 due to leg pain and recurrent nausea  -restaging CT 09/07/2022 showed stable disease in rectum and lung, no new lesions.   -she had chemo break from Feb-May 2024 -restart CAPOX with dose reduction 12/18/2022, not tolerating very well overall.  Oxaliplatin was held on August 5.  -Restaging CT scan from March 26, 2023 showed stable rectal wall thickening, no new metastasis  -due to her significant fatigue and low appetite from oxaliplatin, Oxaliplatin changed to every 6 weeks and continue Xeloda 2 weeks on and 1 week off, she agrees.    The patient understands the plans discussed today and is in agreement with them.  She knows to contact our office if she develops concerns prior to her next  appointment.  I provided *** minutes of face-to-face time during this encounter and > 50% was spent counseling as documented under my assessment and plan.    Jillian Jews, NP  [place] [facility] AT [facility] [address] [place] [address] Dept: [phone] Dept Fax: [phone]   No orders of the defined types were placed in this encounter.     CHIEF COMPLAINT:  CC: rectal adenocarcinoma   Current Treatment:  xeloda 1000 mg taken BID for 14 days on and 7 days off. Reduced dose CapeOx every 6 weeks.   INTERVAL HISTORY:  Jillian Lutz is here today for repeat clinical assessment. She denies fevers or chills. She denies pain. Her appetite is good. Her weight {Weight change:10426}.  I have reviewed the past medical history, past surgical history, social history and family history with the patient and they are unchanged from previous note.  ALLERGIES:  is allergic to no known allergies.  MEDICATIONS:  Current Outpatient Medications  Medication Sig Dispense Refill   amLODipine (NORVASC) 5 MG tablet Take 1 tablet (5 mg total) by mouth daily. 90 tablet 3   brimonidine (ALPHAGAN) 0.2 % ophthalmic solution Place 1 drop into the right eye 2 (two) times daily. 15 mL 3   brimonidine (ALPHAGAN) 0.2 % ophthalmic solution Place 1 drop into the right eye 2 (two) times daily. 10 mL 11   capecitabine (XELODA) 500 MG tablet Take 2 tabs every 12 hours, for 14 days then off for 7 days. Take after meal 56 tablet 2   dorzolamide-timolol (COSOPT) 2-0.5 % ophthalmic solution Place 1 drop into both eyes 2 times daily. 30 mL 3   dorzolamide-timolol (COSOPT) 2-0.5 %  ophthalmic solution Instill 1 drop into both eyes twice a day 10 mL 11   gabapentin (NEURONTIN) 100 MG capsule Take 1 capsule (100 mg total) by mouth 3 (three) times daily as needed. 90 capsule 2   latanoprost (XALATAN) 0.005 % ophthalmic solution Instill 1 drop into both eyes every  night at bedtime 7.5 mL 3   latanoprost (XALATAN) 0.005 % ophthalmic solution Place 1 drop into both eyes at bedtime. 7.5 mL 6   lidocaine (XYLOCAINE) 5 % ointment Apply to anal area daily as needed. 50 g 1   lisinopril-hydrochlorothiazide (ZESTORETIC) 20-12.5 MG tablet Take 2 tablets by mouth daily. 60 tablet 3   mirtazapine (REMERON) 30 MG tablet Take 1 tablet (30 mg total) by mouth at bedtime. 30 tablet 1   Multiple Vitamins-Minerals (MULTIVITAMIN WITH MINERALS) tablet Take 1 tablet by mouth daily. Gummy 50 mg     ondansetron (ZOFRAN) 8 MG tablet Take 1 tablet (8 mg total) by mouth every 8 (eight) hours as needed for nausea or vomiting. Take after 3 days of chemo 30 tablet 2   polyethylene glycol powder (GLYCOLAX/MIRALAX) 17 GM/SCOOP powder Take 1 Container by mouth once.     prochlorperazine (COMPAZINE) 10 MG tablet Take 1 tablet (10 mg total) by mouth every 6 (six) hours as needed. 30 tablet 2   rivaroxaban (XARELTO) 20 MG TABS tablet Take 1 tablet (20 mg total) by mouth daily with supper. 30 tablet 2   traMADol (ULTRAM) 50 MG tablet Take 1 tablet (50 mg total) by mouth every 8 (eight) hours as needed. 60 tablet 0   urea (CARMOL) 10 % cream Apply topically 2 times daily as needed. 85 g 3   No current facility-administered medications for this visit.    HISTORY OF PRESENT ILLNESS:   Oncology History Overview Note  Cancer Staging Rectal adenocarcinoma [facility]) Staging form: Colon and Rectum, AJCC 8th Edition - Clinical stage from 12/21/2020: Stage IIIB (cT3, cN1, cM0) - Unsigned    Rectal adenocarcinoma (HCC)  08/26/2020 Miscellaneous   Initial presentation to PCP, reporting intermittent BRBPR since 02/2020    12/01/2020 Procedure   Colonoscopy by Dr. Lavon Lutz findings - The perianal and digital rectal examinations were normal. - A 15 mm polyp was found in the ascending colon. The polyp was semi-pedunculated. Resection and retrieval were complete. - A 25 mm polyp was found in the sigmoid  colon. The polyp was pedunculated. Resection and retrieval were complete. - An infiltrative partially obstructing large mass was found in the proximal rectum. The mass was partially circumferential (involving one-half of the lumen circumference). The mass measured eight cm in length extending from 10 to 18cm from anal verge. This was biopsied with a cold forceps for histology. Proximal and distal opposite fold area of the mass lesion was tattooed with an injection of total 3 mL of Spot (carbon black).   12/01/2020 Initial Biopsy   Diagnosis 1. Colon, polyp(s), ascending x 1 - ADENOCARCINOMA ARISING IN A TUBULAR ADENOMA WITH HIGH-GRADE DYSPLASIA. SEE NOTE 2. Colon, sigmoid polyp, x1 - TUBULOVILLOUS ADENOMA(S) - NEGATIVE FOR HIGH-GRADE DYSPLASIA OR MALIGNANCY 3. Rectum, biopsy - ADENOCARCINOMA. SEE NOTE   12/01/2020 Cancer Staging   Cancer Staging Rectal adenocarcinoma [facility]) Staging form: Colon and Rectum, AJCC 8th Edition - Clinical stage from 12/21/2020: Stage IIIB (cT3, cN1, cM0) - Unsigned    12/06/2020 Imaging   CT CAP with contrast IMPRESSION: 1. There is partially circumferential soft tissue thickening of the mid to superior rectum, approximately 5 cm in  length and the inferior extent approximately 6 cm above the anal verge, consistent with primary rectal malignancy identified by colonoscopy. 2. There appear to be abnormally enlarged perirectal lymph nodes posteriorly about the superior rectum measuring up to 1.0 x 0.8 cm. Findings are suspicious for perirectal nodal metastatic disease, however rectal MRI is the test of choice for initial local staging of rectal cancer. 3. There is a 4 mm nonspecific pulmonary nodule of the superior segment left lower lobe, statistically most likely incidental, infectious or inflammatory, although nonspecific and isolated metastatic disease is not strictly excluded. Attention on follow-up. 4. No other evidence of metastatic disease in the chest,  abdomen, or pelvis. Aortic Atherosclerosis (ICD10-I70.0).   12/09/2020 Imaging   Local staging MRI pelvis without contrast IMPRESSION: 4.9 cm circumferential mid/lower rectal mass, corresponding to the patient's newly diagnosed rectal cancer. Rectal adenocarcinoma T stage: T3c Rectal adenocarcinoma N stage:  N1 Distance from tumor to the internal anal sphincter is 3.7 cm.   12/21/2020 Initial Diagnosis   Rectal adenocarcinoma (HCC)   01/04/2021 -  Chemotherapy   Concurrent chemoradiation with Xeloda 1000mg  in the AM and 1500mg  in the PM on days of Radiation starting 01/04/21.       01/04/2021 - 02/11/2021 Radiation Therapy   Concurrent chemoradiation with Dr Mitzi Hansen and Xeloda starting 01/04/21.    01/31/2022 Procedure   Colonoscopy, Dr. Lavon Lutz  Findings: - An infiltrative partially obstructing large mass was found in the rectum. The mass was circumferential. The mass measured ten cm in length, extending from 5-15cm from anal verge. In addition, rectal diameter at narrowest approx twelve mm. Oozing was present. Biopsies were taken with a cold forceps for histology.  Impression: - Hemorrhoids found on perianal exam. - Likely malignant partially obstructing tumor in the rectum. Biopsied. - Non-bleeding external and internal hemorrhoids.   01/31/2022 Pathology Results   Diagnosis Rectum, biopsy INVASIVE MODERATELY DIFFERENTIATED ADENOCARCINOMA   05/11/2022 -  Chemotherapy   Patient is on Treatment Plan : COLORECTAL Xelox (Capeox)(130/850) q21d     09/07/2022 Imaging    IMPRESSION: 1. No significant change in circumferential wall thickening of the rectum, in keeping with known primary rectal mass. Similar appearance of post treatment perirectal and presacral fat stranding and fascial thickening 2. Unchanged nodule of the superior segment left lower lobe with adjacent bandlike scarring. No new nodules. 3. No evidence of lymphadenopathy or other metastatic disease in the chest,  abdomen, or pelvis. 4. Large burden of stool throughout the colon. 5. Cardiomegaly.   12/08/2022 Imaging    IMPRESSION: Persistent wall thickening along the rectum with adjacent severe inflammatory stranding and nodularity. Please correlate with exact location of the distribution of the patient's neoplasm.   No discrete new mass lesion, fluid collection or lymph node enlargement.   Improved visualization of the fractures involving the left side of the pubic symphysis as well as probable insufficiency fractures along the sacrum. Associated areas of increasing heterogeneous bony sclerosis along the pubic bones and sacrum. In principle sclerotic bone metastases would be in the differential but are felt to be less likely based on the overall appearance. If needed confirmatory MRI can be considered as clinically appropriate to further delineate.   Stable scarring and fibrotic changes along the lungs with a tiny nodular area along the superior segment of the left lower lobe. Simple continued follow up.   Enlarged heart.       REVIEW OF SYSTEMS:   Constitutional: Denies fevers, chills or abnormal weight loss Eyes: Denies blurriness  of vision Ears, nose, mouth, throat, and face: Denies mucositis or sore throat Respiratory: Denies cough, dyspnea or wheezes Cardiovascular: Denies palpitation, chest discomfort or lower extremity swelling Gastrointestinal:  Denies nausea, heartburn or change in bowel habits Skin: Denies abnormal skin rashes Lymphatics: Denies new lymphadenopathy or easy bruising Neurological:Denies numbness, tingling or new weaknesses Behavioral/Psych: Lutz is stable, no new changes  All other systems were reviewed with the patient and are negative.   VITALS:  There were no vitals taken for this visit.  Wt Readings from Last 3 Encounters:  04/10/23 99 lb 3.2 oz (45 kg)  04/02/23 102 lb 3.2 oz (46.4 kg)  03/13/23 103 lb 14.4 oz (47.1 kg)    There is no height  or weight on file to calculate BMI.  Performance status (ECOG): {CHL ONC [id number]  PHYSICAL EXAM:   GENERAL:alert, no distress and comfortable SKIN: skin color, texture, turgor are normal, no rashes or significant lesions EYES: normal, Conjunctiva are pink and non-injected, sclera clear OROPHARYNX:no exudate, no erythema and lips, buccal mucosa, and tongue normal  NECK: supple, thyroid normal size, non-tender, without nodularity LYMPH:  no palpable lymphadenopathy in the cervical, axillary or inguinal LUNGS: clear to auscultation and percussion with normal breathing effort HEART: regular rate & rhythm and no murmurs and no lower extremity edema ABDOMEN:abdomen soft, non-tender and normal bowel sounds Musculoskeletal:no cyanosis of digits and no clubbing  NEURO: alert & oriented x 3 with fluent speech, no focal motor/sensory deficits  LABORATORY DATA:  I have reviewed the data as listed    Component Value Date/Time   NA 137 04/02/2023 0913   NA 139 09/29/2019 1406   K 3.9 04/02/2023 0913   CL 103 04/02/2023 0913   CO2 28 04/02/2023 0913   GLUCOSE 161 (H) 04/02/2023 0913   BUN 11 04/02/2023 0913   BUN 12 09/29/2019 1406   CREATININE 0.80 04/02/2023 0913   CALCIUM 8.7 (L) 04/02/2023 0913   PROT 7.1 04/02/2023 0913   PROT 6.6 06/22/2016 1004   ALBUMIN 3.5 04/02/2023 0913   ALBUMIN 4.1 06/22/2016 1004   AST 15 04/02/2023 0913   ALT <5 04/02/2023 0913   ALKPHOS 68 04/02/2023 0913   BILITOT 0.4 04/02/2023 0913   GFRNONAA >60 04/02/2023 0913   GFRAA 88 09/29/2019 1406    No results found for: "SPEP", "UPEP"  Lab Results  Component Value Date   WBC 4.1 04/02/2023   NEUTROABS 2.4 04/02/2023   HGB 10.6 (L) 04/02/2023   HCT 32.2 (L) 04/02/2023   MCV 77.0 (L) 04/02/2023   PLT 261 04/02/2023      Chemistry      Component Value Date/Time   NA 137 04/02/2023 0913   NA 139 09/29/2019 1406   K 3.9 04/02/2023 0913   CL 103 04/02/2023 0913   CO2 28 04/02/2023 0913    BUN 11 04/02/2023 0913   BUN 12 09/29/2019 1406   CREATININE 0.80 04/02/2023 0913      Component Value Date/Time   CALCIUM 8.7 (L) 04/02/2023 0913   ALKPHOS 68 04/02/2023 0913   AST 15 04/02/2023 0913   ALT <5 04/02/2023 0913   BILITOT 0.4 04/02/2023 0913       RADIOGRAPHIC STUDIES: I have personally reviewed the radiological images as listed and agreed with the findings in the report. CT CHEST W CONTRAST  Result Date: 04/02/2023 CLINICAL DATA:  Rectal cancer with lung metastasis. Ongoing immunotherapy. Radiation therapy complete in 2022. Weight loss. Nausea and vomiting for 2 days. *  Tracking Code: BO * EXAM: CT CHEST, ABDOMEN, AND PELVIS WITH CONTRAST TECHNIQUE: Multidetector CT imaging of the chest, abdomen and pelvis was performed following the standard protocol during bolus administration of intravenous contrast. RADIATION DOSE REDUCTION: This exam was performed according to the departmental dose-optimization program which includes automated exposure control, adjustment of the mA and/or kV according to patient size and/or use of iterative reconstruction technique. CONTRAST:  75mL OMNIPAQUE IOHEXOL 300 MG/ML  SOLN COMPARISON:  12/08/2022 FINDINGS: CT CHEST FINDINGS Cardiovascular: Aortic atherosclerosis. Tortuous thoracic aorta. Mild cardiomegaly, accentuated by a pectus excavatum deformity. No pericardial effusion. No central pulmonary embolism, on this non-dedicated study. Pulmonary artery enlargement, outflow tract 3.1 cm. Mediastinum/Nodes: No supraclavicular adenopathy. No mediastinal or hilar adenopathy. Lungs/Pleura: No pleural fluid. Similar left base volume loss and atelectasis. The area of probable nodular scarring in the left lower lobe is unchanged on 61/6. Left apical calcified granuloma. Musculoskeletal: No acute osseous abnormality. CT ABDOMEN PELVIS FINDINGS Hepatobiliary: Normal liver. Normal gallbladder, without biliary ductal dilatation. Pancreas: Normal, without mass or  ductal dilatation. Spleen: Normal in size, without focal abnormality. Adrenals/Urinary Tract: Normal adrenal glands. Normal kidneys, without hydronephrosis. Normal urinary bladder. Stomach/Bowel: Normal stomach, without wall thickening. Colonic stool burden suggests constipation. Moderate, relatively long segment rectal wall thickening is felt to be similar. Heterogeneous mild hyperenhancement including on 98/2. Perirectal interstitial thickening again identified. No upstream obstruction. Normal terminal ileum and appendix. Normal small bowel. Vascular/Lymphatic: Aortic atherosclerosis. No abdominopelvic adenopathy. Reproductive: Normal uterus and adnexa. Other: No significant free fluid. No evidence of omental or peritoneal disease. Musculoskeletal: Presumed insufficiency fractures involving the sacrum and bilateral pubic bones again identified. Heterotopic ossification superficial the bilateral gluteus. Grade 1 L5-S1 anterolisthesis. IMPRESSION: 1. Relatively similar appearance of rectal wall thickening and perirectal edema. Although the rectal wall thickening could be radiation induced, suspect residual neoplasm, given heterogeneous enhancement. 2. No findings of metastatic disease in the chest, abdomen, or pelvis. 3.  Possible constipation. 4. Incidental findings, including: Aortic Atherosclerosis (ICD10-I70.0). Pulmonary artery enlargement suggests pulmonary arterial hypertension. Electronically Signed   By: Jeronimo Greaves M.D.   On: 04/02/2023 08:39   CT ABDOMEN PELVIS W CONTRAST  Result Date: 04/02/2023 CLINICAL DATA:  Rectal cancer with lung metastasis. Ongoing immunotherapy. Radiation therapy complete in 2022. Weight loss. Nausea and vomiting for 2 days. * Tracking Code: BO * EXAM: CT CHEST, ABDOMEN, AND PELVIS WITH CONTRAST TECHNIQUE: Multidetector CT imaging of the chest, abdomen and pelvis was performed following the standard protocol during bolus administration of intravenous contrast. RADIATION DOSE  REDUCTION: This exam was performed according to the departmental dose-optimization program which includes automated exposure control, adjustment of the mA and/or kV according to patient size and/or use of iterative reconstruction technique. CONTRAST:  75mL OMNIPAQUE IOHEXOL 300 MG/ML  SOLN COMPARISON:  12/08/2022 FINDINGS: CT CHEST FINDINGS Cardiovascular: Aortic atherosclerosis. Tortuous thoracic aorta. Mild cardiomegaly, accentuated by a pectus excavatum deformity. No pericardial effusion. No central pulmonary embolism, on this non-dedicated study. Pulmonary artery enlargement, outflow tract 3.1 cm. Mediastinum/Nodes: No supraclavicular adenopathy. No mediastinal or hilar adenopathy. Lungs/Pleura: No pleural fluid. Similar left base volume loss and atelectasis. The area of probable nodular scarring in the left lower lobe is unchanged on 61/6. Left apical calcified granuloma. Musculoskeletal: No acute osseous abnormality. CT ABDOMEN PELVIS FINDINGS Hepatobiliary: Normal liver. Normal gallbladder, without biliary ductal dilatation. Pancreas: Normal, without mass or ductal dilatation. Spleen: Normal in size, without focal abnormality. Adrenals/Urinary Tract: Normal adrenal glands. Normal kidneys, without hydronephrosis. Normal urinary bladder. Stomach/Bowel: Normal stomach,  without wall thickening. Colonic stool burden suggests constipation. Moderate, relatively long segment rectal wall thickening is felt to be similar. Heterogeneous mild hyperenhancement including on 98/2. Perirectal interstitial thickening again identified. No upstream obstruction. Normal terminal ileum and appendix. Normal small bowel. Vascular/Lymphatic: Aortic atherosclerosis. No abdominopelvic adenopathy. Reproductive: Normal uterus and adnexa. Other: No significant free fluid. No evidence of omental or peritoneal disease. Musculoskeletal: Presumed insufficiency fractures involving the sacrum and bilateral pubic bones again identified.  Heterotopic ossification superficial the bilateral gluteus. Grade 1 L5-S1 anterolisthesis. IMPRESSION: 1. Relatively similar appearance of rectal wall thickening and perirectal edema. Although the rectal wall thickening could be radiation induced, suspect residual neoplasm, given heterogeneous enhancement. 2. No findings of metastatic disease in the chest, abdomen, or pelvis. 3.  Possible constipation. 4. Incidental findings, including: Aortic Atherosclerosis (ICD10-I70.0). Pulmonary artery enlargement suggests pulmonary arterial hypertension. Electronically Signed   By: Jeronimo Greaves M.D.   On: 04/02/2023 08:39

## 2023-04-24 ENCOUNTER — Other Ambulatory Visit (HOSPITAL_COMMUNITY): Payer: Self-pay

## 2023-04-24 ENCOUNTER — Telehealth: Payer: Self-pay | Admitting: Hematology

## 2023-04-24 ENCOUNTER — Other Ambulatory Visit: Payer: Self-pay

## 2023-04-24 ENCOUNTER — Inpatient Hospital Stay: Payer: Medicaid Other

## 2023-04-24 ENCOUNTER — Other Ambulatory Visit: Payer: Self-pay | Admitting: Student

## 2023-04-24 ENCOUNTER — Telehealth: Payer: Self-pay

## 2023-04-24 ENCOUNTER — Ambulatory Visit: Payer: Medicaid Other

## 2023-04-24 ENCOUNTER — Inpatient Hospital Stay: Payer: Medicaid Other | Attending: Physician Assistant | Admitting: Nurse Practitioner

## 2023-04-24 DIAGNOSIS — Z923 Personal history of irradiation: Secondary | ICD-10-CM | POA: Insufficient documentation

## 2023-04-24 DIAGNOSIS — Z636 Dependent relative needing care at home: Secondary | ICD-10-CM | POA: Insufficient documentation

## 2023-04-24 DIAGNOSIS — C20 Malignant neoplasm of rectum: Secondary | ICD-10-CM

## 2023-04-24 DIAGNOSIS — Z5111 Encounter for antineoplastic chemotherapy: Secondary | ICD-10-CM | POA: Insufficient documentation

## 2023-04-24 DIAGNOSIS — Z7901 Long term (current) use of anticoagulants: Secondary | ICD-10-CM | POA: Insufficient documentation

## 2023-04-24 DIAGNOSIS — R630 Anorexia: Secondary | ICD-10-CM

## 2023-04-24 DIAGNOSIS — C7800 Secondary malignant neoplasm of unspecified lung: Secondary | ICD-10-CM | POA: Insufficient documentation

## 2023-04-24 DIAGNOSIS — R202 Paresthesia of skin: Secondary | ICD-10-CM

## 2023-04-24 DIAGNOSIS — Z86718 Personal history of other venous thrombosis and embolism: Secondary | ICD-10-CM | POA: Insufficient documentation

## 2023-04-24 MED ORDER — GABAPENTIN 100 MG PO CAPS
100.0000 mg | ORAL_CAPSULE | Freq: Three times a day (TID) | ORAL | 2 refills | Status: DC | PRN
  Filled 2023-04-24: qty 90, 30d supply, fill #0
  Filled 2023-07-23 (×2): qty 90, 30d supply, fill #1
  Filled 2023-09-24: qty 90, 30d supply, fill #2

## 2023-04-24 MED ORDER — MIRTAZAPINE 30 MG PO TABS
30.0000 mg | ORAL_TABLET | Freq: Every day | ORAL | 1 refills | Status: DC
  Filled 2023-04-24: qty 30, 30d supply, fill #0
  Filled 2023-05-30: qty 30, 30d supply, fill #1

## 2023-04-24 NOTE — Telephone Encounter (Signed)
Called patient in response to phone call from [facility] regarding missed appointment today.  Stated she did not know she had appointment.  CN rescheduled for 04/30/2023 @ 10:00 am and requested medicaid transportation. Will call patient again tomorrow with interpreter [name] to verify patient understanding.  [name] RN, Congregational Nurse [phone]

## 2023-04-24 NOTE — Telephone Encounter (Signed)
I call the pt  emergency contact to make the pt aware of her appt on this morning 10/8.  Pt forgot that she had a n appointment. I let her know that I will be sending a message to scheduling to get her another appointment. Pt verbalized understanding.   Kojo Liby M,CMA

## 2023-04-25 ENCOUNTER — Telehealth: Payer: Self-pay

## 2023-04-25 NOTE — Telephone Encounter (Signed)
Called patient (with interpreter [name] assisting) to remind of appointments at [facility] on 04/30/2023 @ 10:00 am and 05/15/2023 @ 9:30 am.  Advised that Medicaid transportation has been requested for both and that she needs to be ready 1 hour prior for pickup.  Also reminded her of 2:00 home visit appointment today with palliative care social worker [place].  [name] will be present to interpret.  Patient states she will have friend pick up prescriptions for her and her husband from [facility].  [name] RN, Congregational Nurse [phone]

## 2023-04-26 ENCOUNTER — Other Ambulatory Visit: Payer: Medicaid Other

## 2023-04-26 ENCOUNTER — Ambulatory Visit: Payer: Medicaid Other | Admitting: Nurse Practitioner

## 2023-04-26 NOTE — Progress Notes (Signed)
Internal Medicine Clinic Attending  I was physically present during the key portions of the resident provided service and participated in the medical decision making of patient's management care. I reviewed pertinent patient test results.  The assessment, diagnosis, and plan were formulated together and I agree with the documentation in the resident's note.  Mercie Eon, MD

## 2023-04-27 ENCOUNTER — Other Ambulatory Visit: Payer: Self-pay

## 2023-04-27 ENCOUNTER — Other Ambulatory Visit (HOSPITAL_COMMUNITY): Payer: Self-pay

## 2023-04-30 ENCOUNTER — Encounter: Payer: Self-pay | Admitting: Hematology

## 2023-04-30 ENCOUNTER — Inpatient Hospital Stay: Payer: Medicaid Other

## 2023-04-30 ENCOUNTER — Other Ambulatory Visit: Payer: Self-pay

## 2023-04-30 ENCOUNTER — Inpatient Hospital Stay (HOSPITAL_BASED_OUTPATIENT_CLINIC_OR_DEPARTMENT_OTHER): Payer: Medicaid Other | Admitting: Hematology

## 2023-04-30 VITALS — BP 134/82 | HR 64 | Temp 98.2°F | Resp 15 | Ht <= 58 in | Wt 98.7 lb

## 2023-04-30 DIAGNOSIS — Z86718 Personal history of other venous thrombosis and embolism: Secondary | ICD-10-CM | POA: Diagnosis not present

## 2023-04-30 DIAGNOSIS — C20 Malignant neoplasm of rectum: Secondary | ICD-10-CM

## 2023-04-30 DIAGNOSIS — Z636 Dependent relative needing care at home: Secondary | ICD-10-CM | POA: Diagnosis not present

## 2023-04-30 DIAGNOSIS — Z5111 Encounter for antineoplastic chemotherapy: Secondary | ICD-10-CM | POA: Diagnosis present

## 2023-04-30 DIAGNOSIS — Z923 Personal history of irradiation: Secondary | ICD-10-CM | POA: Diagnosis not present

## 2023-04-30 DIAGNOSIS — D509 Iron deficiency anemia, unspecified: Secondary | ICD-10-CM

## 2023-04-30 DIAGNOSIS — Z7901 Long term (current) use of anticoagulants: Secondary | ICD-10-CM | POA: Diagnosis not present

## 2023-04-30 DIAGNOSIS — C7800 Secondary malignant neoplasm of unspecified lung: Secondary | ICD-10-CM | POA: Diagnosis not present

## 2023-04-30 LAB — FERRITIN: Ferritin: 58 ng/mL (ref 11–307)

## 2023-04-30 LAB — CMP (CANCER CENTER ONLY)
ALT: <5 U/L (ref 0–44)
AST: 16 U/L (ref 15–41)
Albumin: 3.6 g/dL (ref 3.5–5.0)
Alkaline Phosphatase: 66 U/L (ref 38–126)
Anion gap: 6 (ref 5–15)
BUN: 15 mg/dL (ref 8–23)
CO2: 31 mmol/L (ref 22–32)
Calcium: 9.2 mg/dL (ref 8.9–10.3)
Chloride: 102 mmol/L (ref 98–111)
Creatinine: 0.84 mg/dL (ref 0.44–1.00)
GFR, Estimated: >60 mL/min (ref >60)
Glucose, Bld: 121 mg/dL — ABNORMAL HIGH (ref 70–99)
Potassium: 3.7 mmol/L (ref 3.5–5.1)
Sodium: 139 mmol/L (ref 135–145)
Total Bilirubin: 0.3 mg/dL (ref 0.3–1.2)
Total Protein: 7.3 g/dL (ref 6.5–8.1)

## 2023-04-30 LAB — CBC WITH DIFFERENTIAL (CANCER CENTER ONLY)
Abs Immature Granulocytes: 0.01 10*3/uL (ref 0.00–0.07)
Basophils Absolute: 0 10*3/uL (ref 0.0–0.1)
Basophils Relative: 1 %
Eosinophils Absolute: 0.2 10*3/uL (ref 0.0–0.5)
Eosinophils Relative: 5 %
HCT: 32.1 % — ABNORMAL LOW (ref 36.0–46.0)
Hemoglobin: 10.3 g/dL — ABNORMAL LOW (ref 12.0–15.0)
Immature Granulocytes: 0 %
Lymphocytes Relative: 21 %
Lymphs Abs: 0.8 10*3/uL (ref 0.7–4.0)
MCH: 25.1 pg — ABNORMAL LOW (ref 26.0–34.0)
MCHC: 32.1 g/dL (ref 30.0–36.0)
MCV: 78.1 fL — ABNORMAL LOW (ref 80.0–100.0)
Monocytes Absolute: 0.5 10*3/uL (ref 0.1–1.0)
Monocytes Relative: 12 %
Neutro Abs: 2.4 10*3/uL (ref 1.7–7.7)
Neutrophils Relative %: 61 %
Platelet Count: 266 10*3/uL (ref 150–400)
RBC: 4.11 MIL/uL (ref 3.87–5.11)
RDW: 17 % — ABNORMAL HIGH (ref 11.5–15.5)
WBC Count: 3.9 10*3/uL — ABNORMAL LOW (ref 4.0–10.5)
nRBC: 0 % (ref 0.0–0.2)

## 2023-04-30 LAB — CEA (ACCESS): CEA (CHCC): 11.29 ng/mL — ABNORMAL HIGH (ref 0.00–5.00)

## 2023-04-30 LAB — TOTAL PROTEIN, URINE DIPSTICK: Protein, ur: NEGATIVE mg/dL

## 2023-04-30 MED ORDER — CAPECITABINE 500 MG PO TABS
ORAL_TABLET | ORAL | 2 refills | Status: DC
Start: 2023-04-30 — End: 2023-07-23
  Filled 2023-04-30: qty 56, fill #0
  Filled 2023-05-04: qty 56, 14d supply, fill #0
  Filled 2023-05-15 (×2): qty 56, 14d supply, fill #1
  Filled 2023-06-06: qty 56, 14d supply, fill #2

## 2023-04-30 NOTE — Progress Notes (Signed)
[facility]   [phone]) [phone] [phone]   Clinic Follow up Note   Patient Care Team: [name], MD as PCP - General [name], RN (Inactive) as Oncology Nurse Navigator [name], NP as Nurse Practitioner (Nurse Practitioner) Malachy Mood, MD as Consulting Physician (Hematology and Oncology) Dorothy Puffer, MD as Consulting Physician (Radiation Oncology) [name], Netty Starring., MD as Consulting Physician (Gastroenterology) Josephine Igo, DO as Consulting Physician (Pulmonary Disease) Karie Soda, MD as Consulting Physician (General Surgery)  Date of Service:  04/30/2023  CHIEF COMPLAINT: f/u of metastatic rectal cancer  CURRENT THERAPY:  CapeOx  Oncology History   Rectal adenocarcinoma (HCC) cT3cN1M0 stage IIIb, biopsy confirmed residual primary tumor and oligo lung met in 09/2021 -Initially diagnosed 11/2020 by colonoscopy for progressive rectal pain and bleeding, weight loss, and constipation. -Despite strong recommendation, patient firmly and repeatedly declined IV chemo and surgery. She agreed to chemoRT with Xeloda, and received the treatment 01/04/21 - 02/14/21. -she developed local cancer progression and lung mets in 11/2021 -she started Xeloda on 02/16/2022, low dose oxaliplatin was added on 05/11/22 (with cycle 4 of Xeloda)  -chemo held since 07/2022 due to leg pain and recurrent nausea  -restaging CT 09/07/2022 showed stable disease in rectum and lung, no new lesions.   -she had chemo break from Feb-May 2024 -restart CAPOX with dose reduction 12/18/2022, not tolerating very well overall.  Oxaliplatin was held on August 5.  -Restaging CT scan from March 26, 2023 showed stable rectal wall thickening, no other  evidence of metastasis    Assessment and Plan    Metastatic rectal Cancer Patient reports fatigue and loss of appetite for two weeks following intravenous chemotherapy. She is currently on her second week of oral  chemotherapy (Xeloda). No new symptoms or side effects reported. -Skip intravenous chemotherapy for this cycle due to missed appointment. -Continue Xeloda as prescribed. -Check tumor markers today. -Plan for repeat scan in December.  Venous Thromboembolism Patient had a blood clot in December 2023 and is currently on Xarelto. -Continue Xarelto as prescribed.  Plan -Refill Xeloda prescription at [facility] pharmacy  -Next appointment scheduled for May 15, 2023, with planned intravenous chemotherapy oxaliplatin -f/u on 10/29, Order scan on next appointment.         SUMMARY OF ONCOLOGIC HISTORY: Oncology History Overview Note  Cancer Staging Rectal adenocarcinoma [facility]) Staging form: Colon and Rectum, AJCC 8th Edition - Clinical stage from 12/21/2020: Stage IIIB (cT3, cN1, cM0) - Unsigned    Rectal adenocarcinoma (HCC)  08/26/2020 Miscellaneous   Initial presentation to PCP, reporting intermittent BRBPR since 02/2020    12/01/2020 Procedure   Colonoscopy by Dr. Lavon Paganini findings - The perianal and digital rectal examinations were normal. - A 15 mm polyp was found in the ascending colon. The polyp was semi-pedunculated. Resection and retrieval were complete. - A 25 mm polyp was found in the sigmoid colon. The polyp was pedunculated. Resection and retrieval were complete. - An infiltrative partially obstructing large mass was found in the proximal rectum. The mass was partially circumferential (involving one-half of the lumen circumference). The mass measured eight cm in length extending from 10 to 18cm from anal verge. This was biopsied with a cold forceps for histology. Proximal and distal opposite fold area of the mass lesion was tattooed with an injection of total 3 mL of Spot (carbon black).   12/01/2020 Initial Biopsy   Diagnosis 1. Colon, polyp(s), ascending x 1 - ADENOCARCINOMA ARISING IN A TUBULAR ADENOMA  WITH HIGH-GRADE DYSPLASIA. SEE NOTE 2. Colon, sigmoid polyp, x1 -  TUBULOVILLOUS ADENOMA(S) - NEGATIVE FOR HIGH-GRADE DYSPLASIA OR MALIGNANCY 3. Rectum, biopsy - ADENOCARCINOMA. SEE NOTE   12/01/2020 Cancer Staging   Cancer Staging Rectal adenocarcinoma [facility]) Staging form: Colon and Rectum, AJCC 8th Edition - Clinical stage from 12/21/2020: Stage IIIB (cT3, cN1, cM0) - Unsigned    12/06/2020 Imaging   CT CAP with contrast IMPRESSION: 1. There is partially circumferential soft tissue thickening of the mid to superior rectum, approximately 5 cm in length and the inferior extent approximately 6 cm above the anal verge, consistent with primary rectal malignancy identified by colonoscopy. 2. There appear to be abnormally enlarged perirectal lymph nodes posteriorly about the superior rectum measuring up to 1.0 x 0.8 cm. Findings are suspicious for perirectal nodal metastatic disease, however rectal MRI is the test of choice for initial local staging of rectal cancer. 3. There is a 4 mm nonspecific pulmonary nodule of the superior segment left lower lobe, statistically most likely incidental, infectious or inflammatory, although nonspecific and isolated metastatic disease is not strictly excluded. Attention on follow-up. 4. No other evidence of metastatic disease in the chest, abdomen, or pelvis. Aortic Atherosclerosis (ICD10-I70.0).   12/09/2020 Imaging   Local staging MRI pelvis without contrast IMPRESSION: 4.9 cm circumferential mid/lower rectal mass, corresponding to the patient's newly diagnosed rectal cancer. Rectal adenocarcinoma T stage: T3c Rectal adenocarcinoma N stage:  N1 Distance from tumor to the internal anal sphincter is 3.7 cm.   12/21/2020 Initial Diagnosis   Rectal adenocarcinoma (HCC)   01/04/2021 -  Chemotherapy   Concurrent chemoradiation with Xeloda 1000mg  in the AM and 1500mg  in the PM on days of Radiation starting 01/04/21.       01/04/2021 - 02/11/2021 Radiation Therapy   Concurrent chemoradiation with Dr Mitzi Hansen and Xeloda  starting 01/04/21.    01/31/2022 Procedure   Colonoscopy, Dr. Lavon Paganini  Findings: - An infiltrative partially obstructing large mass was found in the rectum. The mass was circumferential. The mass measured ten cm in length, extending from 5-15cm from anal verge. In addition, rectal diameter at narrowest approx twelve mm. Oozing was present. Biopsies were taken with a cold forceps for histology.  Impression: - Hemorrhoids found on perianal exam. - Likely malignant partially obstructing tumor in the rectum. Biopsied. - Non-bleeding external and internal hemorrhoids.   01/31/2022 Pathology Results   Diagnosis Rectum, biopsy INVASIVE MODERATELY DIFFERENTIATED ADENOCARCINOMA   05/11/2022 -  Chemotherapy   Patient is on Treatment Plan : COLORECTAL Xelox (Capeox)(130/850) q21d     09/07/2022 Imaging    IMPRESSION: 1. No significant change in circumferential wall thickening of the rectum, in keeping with known primary rectal mass. Similar appearance of post treatment perirectal and presacral fat stranding and fascial thickening 2. Unchanged nodule of the superior segment left lower lobe with adjacent bandlike scarring. No new nodules. 3. No evidence of lymphadenopathy or other metastatic disease in the chest, abdomen, or pelvis. 4. Large burden of stool throughout the colon. 5. Cardiomegaly.   12/08/2022 Imaging    IMPRESSION: Persistent wall thickening along the rectum with adjacent severe inflammatory stranding and nodularity. Please correlate with exact location of the distribution of the patient's neoplasm.   No discrete new mass lesion, fluid collection or lymph node enlargement.   Improved visualization of the fractures involving the left side of the pubic symphysis as well as probable insufficiency fractures along the sacrum. Associated areas of increasing heterogeneous bony sclerosis along the pubic bones and sacrum.  In principle sclerotic bone metastases would be in the  differential but are felt to be less likely based on the overall appearance. If needed confirmatory MRI can be considered as clinically appropriate to further delineate.   Stable scarring and fibrotic changes along the lungs with a tiny nodular area along the superior segment of the left lower lobe. Simple continued follow up.   Enlarged heart.      Discussed the use of AI scribe software for clinical note transcription with the patient, who gave verbal consent to proceed.  History of Present Illness   A 72 year old female with a history of metastatic colon cancer presents for follow-up. She reports feeling fatigued and experiencing a loss of appetite for about two weeks following her last intravenous chemotherapy. Despite these symptoms, she has been able to continue her daily activities, including caring for her husband who has memory issues. She has been taking Xeloda, a chemotherapy pill, for the past week. She also reports being on Xarelto, a blood thinner, due to a blood clot seen last year. She missed her last appointment due to forgetfulness. She has concerns about her diet and is unsure of what she should be eating.         All other systems were reviewed with the patient and are negative.  MEDICAL HISTORY:  Past Medical History:  Diagnosis Date   Bilateral wrist pain 10/25/2017   Hypertension    Left leg DVT (deep venous thrombosis) (HCC) 06/2022   Neck mass 1998   Unknown biopsy results. Excised in [place].   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   Rectal cancer (HCC)    Trigger finger, acquired 02/08/2017    SURGICAL HISTORY: Past Surgical History:  Procedure Laterality Date   BRONCHIAL BIOPSY  12/13/2021   Procedure: BRONCHIAL BIOPSIES;  Surgeon: Josephine Igo, DO;  Location: [facility] END[facility];  Service: Pulmonary;;   BRONCHIAL NEEDLE ASPIRATION BIOPSY  12/13/2021   Procedure: BRONCHIAL NEEDLE ASPIRATION BIOPSIES;  Surgeon: Josephine Igo, DO;  Location: [facility]OS[facility];  Service: Pulmonary;;   NECK SURGERY Left 1998   Excision of mass in [place]   VIDEO BRONCHOSCOPY WITH RADIAL ENDOBRONCHIAL ULTRASOUND  12/13/2021   Procedure: RADIAL ENDOBRONCHIAL ULTRASOUND;  Surgeon: Josephine Igo, DO;  Location: [facility]OS[facility];  Service: Pulmonary;;    I have reviewed the social history and family history with the patient and they are unchanged from previous note.  ALLERGIES:  is allergic to no known allergies.  MEDICATIONS:  Current Outpatient Medications  Medication Sig Dispense Refill   amLODipine (NORVASC) 5 MG tablet Take 1 tablet (5 mg total) by mouth daily. 90 tablet 3   brimonidine (ALPHAGAN) 0.2 % ophthalmic solution Place 1 drop into the right eye 2 (two) times daily. 15 mL 3   brimonidine (ALPHAGAN) 0.2 % ophthalmic solution Place 1 drop into the right eye 2 (two) times daily. 10 mL 11   capecitabine (XELODA) 500 MG tablet Take 2 tabs every 12 hours, for 14 days then off for 7 days. Take after meal 56 tablet 2   dorzolamide-timolol (COSOPT) 2-0.5 % ophthalmic solution Place 1 drop into both eyes 2 times daily. 30 mL 3   dorzolamide-timolol (COSOPT) 2-0.5 % ophthalmic solution Instill 1 drop into both eyes twice a day 10 mL 11   gabapentin (NEURONTIN) 100 MG capsule Take 1 capsule (100 mg total) by mouth 3 (three) times daily as needed. 90 capsule 2   latanoprost (XALATAN)  0.005 % ophthalmic solution Instill 1 drop into both eyes every night at bedtime 7.5 mL 3   latanoprost (XALATAN) 0.005 % ophthalmic solution Place 1 drop into both eyes at bedtime. 7.5 mL 6   lidocaine (XYLOCAINE) 5 % ointment Apply to anal area daily as needed. 50 g 1   lisinopril-hydrochlorothiazide (ZESTORETIC) 20-12.5 MG tablet Take 2 tablets by mouth daily. 60 tablet 3   mirtazapine (REMERON) 30 MG tablet Take 1 tablet (30 mg total) by mouth at bedtime. 30 tablet 1   Multiple Vitamins-Minerals (MULTIVITAMIN WITH MINERALS) tablet Take 1  tablet by mouth daily. Gummy 50 mg     ondansetron (ZOFRAN) 8 MG tablet Take 1 tablet (8 mg total) by mouth every 8 (eight) hours as needed for nausea or vomiting. Take after 3 days of chemo 30 tablet 2   polyethylene glycol powder (GLYCOLAX/MIRALAX) 17 GM/SCOOP powder Take 1 Container by mouth once.     prochlorperazine (COMPAZINE) 10 MG tablet Take 1 tablet (10 mg total) by mouth every 6 (six) hours as needed. 30 tablet 2   rivaroxaban (XARELTO) 20 MG TABS tablet Take 1 tablet (20 mg total) by mouth daily with supper. 30 tablet 2   traMADol (ULTRAM) 50 MG tablet Take 1 tablet (50 mg total) by mouth every 8 (eight) hours as needed. 60 tablet 0   urea (CARMOL) 10 % cream Apply topically 2 times daily as needed. 85 g 3   No current facility-administered medications for this visit.    PHYSICAL EXAMINATION: ECOG PERFORMANCE STATUS: 1 - Symptomatic but completely ambulatory  Vitals:   04/30/23 1018  BP: 134/82  Pulse: 64  Resp: 15  Temp: 98.2 F (36.8 C)  SpO2: 96%   Wt Readings from Last 3 Encounters:  04/30/23 98 lb 11.2 oz (44.8 kg)  04/10/23 99 lb 3.2 oz (45 kg)  04/02/23 102 lb 3.2 oz (46.4 kg)     GENERAL:alert, no distress and comfortable SKIN: skin color, texture, turgor are normal, no rashes or significant lesions EYES: normal, Conjunctiva are pink and non-injected, sclera clear NECK: supple, thyroid normal size, non-tender, without nodularity LYMPH:  no palpable lymphadenopathy in the cervical, axillary  LUNGS: clear to auscultation and percussion with normal breathing effort HEART: regular rate & rhythm and no murmurs and no lower extremity edema ABDOMEN:abdomen soft, non-tender and normal bowel sounds Musculoskeletal:no cyanosis of digits and no clubbing  NEURO: alert & oriented x 3 with fluent speech, no focal motor/sensory deficits      LABORATORY DATA:  I have reviewed the data as listed    Latest Ref Rng & Units 04/30/2023    9:55 AM 04/02/2023    9:13 AM  03/13/2023   10:11 AM  CBC  WBC 4.0 - 10.5 K/uL 3.9  4.1  4.8   Hemoglobin 12.0 - 15.0 g/dL 72.5  36.6  44.0   Hematocrit 36.0 - 46.0 % 32.1  32.2  34.2   Platelets 150 - 400 K/uL 266  261  243         Latest Ref Rng & Units 04/30/2023    9:55 AM 04/02/2023    9:13 AM 03/13/2023   10:11 AM  CMP  Glucose 70 - 99 mg/dL 347  425  94   BUN 8 - 23 mg/dL 15  11  11    Creatinine 0.44 - 1.00 mg/dL 9.56  3.87  5.64   Sodium 135 - 145 mmol/L 139  137  137   Potassium 3.5 - 5.1  mmol/L 3.7  3.9  3.9   Chloride 98 - 111 mmol/L 102  103  103   CO2 22 - 32 mmol/L 31  28  27    Calcium 8.9 - 10.3 mg/dL 9.2  8.7  8.9   Total Protein 6.5 - 8.1 g/dL 7.3  7.1  7.3   Total Bilirubin 0.3 - 1.2 mg/dL 0.3  0.4  0.4   Alkaline Phos 38 - 126 U/L 66  68  68   AST 15 - 41 U/L 16  15  16    ALT 0 - 44 U/L <5  <5  <5       RADIOGRAPHIC STUDIES: I have personally reviewed the radiological images as listed and agreed with the findings in the report. No results found.    Orders Placed This Encounter  Procedures   [facility] (Access)-[facility] ONLY    Standing Status:   Standing    Number of Occurrences:   30    Standing Expiration Date:   04/29/2024   All questions were answered. The patient knows to call the clinic with any problems, questions or concerns. No barriers to learning was detected. The total time spent in the appointment was 25 minutes.     Malachy Mood, MD 04/30/2023

## 2023-04-30 NOTE — Assessment & Plan Note (Signed)
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 -Despite strong recommendation, patient firmly and repeatedly declined IV chemo and surgery. She agreed to chemoRT with Xeloda, and received the treatment 01/04/21 - 02/14/21. -she developed local cancer progression and lung mets in 11/2021 -she started Xeloda on 02/16/2022, low dose oxaliplatin was added on 05/11/22 (with cycle 4 of Xeloda)  -chemo held since 07/2022 due to leg pain and recurrent nausea  -restaging CT 09/07/2022 showed stable disease in rectum and lung, no new lesions.   -she had chemo break from Feb-May 2024 -restart CAPOX with dose reduction 12/18/2022, not tolerating very well overall.  Oxaliplatin was held on August 5.  -Restaging CT scan from March 26, 2023 showed stable rectal wall thickening, no other  evidence of metastasis

## 2023-05-02 ENCOUNTER — Other Ambulatory Visit: Payer: Self-pay

## 2023-05-04 ENCOUNTER — Other Ambulatory Visit: Payer: Self-pay

## 2023-05-04 NOTE — Progress Notes (Signed)
Specialty Pharmacy Refill Coordination Note  Jillian Lutz is a 72 y.o. female,  [name], RN patient's congregational nurse  was contacted today regarding refills of specialty medication(s) Capecitabine   Patient requested [name] at [facility] at [place] date: 05/08/23   Medication will be filled on 05/07/23.

## 2023-05-07 ENCOUNTER — Other Ambulatory Visit: Payer: Self-pay

## 2023-05-07 ENCOUNTER — Other Ambulatory Visit (HOSPITAL_COMMUNITY): Payer: Self-pay

## 2023-05-09 ENCOUNTER — Telehealth: Payer: Self-pay

## 2023-05-09 NOTE — Telephone Encounter (Signed)
Phone call to patient with interpreter [name] assisting.  Patient had told interpreter that she did not receive food stamp benefits for October.  Informed patient that CN called DSS but they could not release information and will have someone call her back with a [place] interpreter.  We also reminded her of Cancer Center appointment on 05/15/2023 and to be ready for pickup at 8:30 am.  [name] RN, Congregational Nurse [phone]

## 2023-05-10 ENCOUNTER — Other Ambulatory Visit (HOSPITAL_COMMUNITY): Payer: Self-pay

## 2023-05-14 NOTE — Assessment & Plan Note (Signed)
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 -Despite strong recommendation, patient firmly and repeatedly declined IV chemo and surgery. She agreed to chemoRT with Xeloda, and received the treatment 01/04/21 - 02/14/21. -she developed local cancer progression and lung mets in 11/2021 -she started Xeloda on 02/16/2022, low dose oxaliplatin was added on 05/11/22 (with cycle 4 of Xeloda)  -chemo held since 07/2022 due to leg pain and recurrent nausea  -restaging CT 09/07/2022 showed stable disease in rectum and lung, no new lesions.   -she had chemo break from Feb-May 2024 -restart CAPOX with dose reduction 12/18/2022, not tolerating very well overall.  Oxaliplatin was held on August 5.  -Restaging CT scan from March 26, 2023 showed stable rectal wall thickening, no other  evidence of metastasis

## 2023-05-15 ENCOUNTER — Inpatient Hospital Stay: Payer: Medicaid Other

## 2023-05-15 ENCOUNTER — Other Ambulatory Visit (HOSPITAL_COMMUNITY): Payer: Self-pay

## 2023-05-15 ENCOUNTER — Inpatient Hospital Stay (HOSPITAL_BASED_OUTPATIENT_CLINIC_OR_DEPARTMENT_OTHER): Payer: Medicaid Other | Admitting: Hematology

## 2023-05-15 ENCOUNTER — Inpatient Hospital Stay: Payer: Medicaid Other | Admitting: Dietician

## 2023-05-15 ENCOUNTER — Other Ambulatory Visit: Payer: Self-pay

## 2023-05-15 VITALS — BP 133/78 | HR 58 | Temp 97.8°F | Resp 16 | Wt 95.8 lb

## 2023-05-15 DIAGNOSIS — C20 Malignant neoplasm of rectum: Secondary | ICD-10-CM

## 2023-05-15 DIAGNOSIS — Z5111 Encounter for antineoplastic chemotherapy: Secondary | ICD-10-CM | POA: Diagnosis not present

## 2023-05-15 LAB — CMP (CANCER CENTER ONLY)
ALT: <5 U/L (ref 0–44)
AST: 16 U/L (ref 15–41)
Albumin: 3.6 g/dL (ref 3.5–5.0)
Alkaline Phosphatase: 65 U/L (ref 38–126)
Anion gap: 6 (ref 5–15)
BUN: 17 mg/dL (ref 8–23)
CO2: 28 mmol/L (ref 22–32)
Calcium: 9 mg/dL (ref 8.9–10.3)
Chloride: 104 mmol/L (ref 98–111)
Creatinine: 0.94 mg/dL (ref 0.44–1.00)
GFR, Estimated: >60 mL/min (ref >60)
Glucose, Bld: 142 mg/dL — ABNORMAL HIGH (ref 70–99)
Potassium: 3.8 mmol/L (ref 3.5–5.1)
Sodium: 138 mmol/L (ref 135–145)
Total Bilirubin: 0.4 mg/dL (ref 0.3–1.2)
Total Protein: 7.1 g/dL (ref 6.5–8.1)

## 2023-05-15 LAB — CBC WITH DIFFERENTIAL (CANCER CENTER ONLY)
Abs Immature Granulocytes: 0.01 10*3/uL (ref 0.00–0.07)
Basophils Absolute: 0.1 10*3/uL (ref 0.0–0.1)
Basophils Relative: 1 %
Eosinophils Absolute: 0.2 10*3/uL (ref 0.0–0.5)
Eosinophils Relative: 4 %
HCT: 29.3 % — ABNORMAL LOW (ref 36.0–46.0)
Hemoglobin: 9.7 g/dL — ABNORMAL LOW (ref 12.0–15.0)
Immature Granulocytes: 0 %
Lymphocytes Relative: 19 %
Lymphs Abs: 0.8 10*3/uL (ref 0.7–4.0)
MCH: 25.7 pg — ABNORMAL LOW (ref 26.0–34.0)
MCHC: 33.1 g/dL (ref 30.0–36.0)
MCV: 77.5 fL — ABNORMAL LOW (ref 80.0–100.0)
Monocytes Absolute: 0.5 10*3/uL (ref 0.1–1.0)
Monocytes Relative: 12 %
Neutro Abs: 2.6 10*3/uL (ref 1.7–7.7)
Neutrophils Relative %: 64 %
Platelet Count: 220 10*3/uL (ref 150–400)
RBC: 3.78 MIL/uL — ABNORMAL LOW (ref 3.87–5.11)
RDW: 16.9 % — ABNORMAL HIGH (ref 11.5–15.5)
WBC Count: 4.1 10*3/uL (ref 4.0–10.5)
nRBC: 0 % (ref 0.0–0.2)

## 2023-05-15 MED ORDER — DEXTROSE 5 % IV SOLN: Freq: Once | INTRAVENOUS | Status: AC

## 2023-05-15 MED ORDER — FAMOTIDINE IN NACL 20-0.9 MG/50ML-% IV SOLN
20.0000 mg | Freq: Once | INTRAVENOUS | Status: AC
Start: 2023-05-15 — End: 2023-05-15
  Administered 2023-05-15: 20 mg via INTRAVENOUS
  Filled 2023-05-15: qty 50

## 2023-05-15 MED ORDER — SODIUM CHLORIDE 0.9 % IV SOLN
Freq: Once | INTRAVENOUS | Status: AC
Start: 2023-05-15 — End: 2023-05-15

## 2023-05-15 MED ORDER — BEVACIZUMAB-ADCD CHEMO INJECTION 400 MG/16ML
7.5000 mg/kg | Freq: Once | INTRAVENOUS | Status: AC
  Administered 2023-05-15: 350 mg via INTRAVENOUS
  Filled 2023-05-15: qty 14

## 2023-05-15 MED ORDER — DIPHENHYDRAMINE HCL 50 MG/ML IJ SOLN
25.0000 mg | Freq: Once | INTRAMUSCULAR | Status: AC
Start: 2023-05-15 — End: 2023-05-15
  Administered 2023-05-15: 25 mg via INTRAVENOUS
  Filled 2023-05-15: qty 1

## 2023-05-15 MED ORDER — OXALIPLATIN CHEMO INJECTION 100 MG/20ML
70.0000 mg/m2 | Freq: Once | INTRAVENOUS | Status: AC
Start: 2023-05-15 — End: 2023-05-15
  Administered 2023-05-15: 100 mg via INTRAVENOUS
  Filled 2023-05-15: qty 20

## 2023-05-15 MED ORDER — DEXAMETHASONE SODIUM PHOSPHATE 10 MG/ML IJ SOLN
10.0000 mg | Freq: Once | INTRAMUSCULAR | Status: AC
  Administered 2023-05-15: 10 mg via INTRAVENOUS
  Filled 2023-05-15: qty 1

## 2023-05-15 MED ORDER — PALONOSETRON HCL INJECTION 0.25 MG/5ML
0.2500 mg | Freq: Once | INTRAVENOUS | Status: AC
  Administered 2023-05-15: 0.25 mg via INTRAVENOUS
  Filled 2023-05-15: qty 5

## 2023-05-15 MED ORDER — DEXAMETHASONE 4 MG PO TABS
4.0000 mg | ORAL_TABLET | Freq: Every day | ORAL | 1 refills | Status: DC
  Filled 2023-05-15: qty 20, 20d supply, fill #0
  Filled 2023-07-23: qty 20, 20d supply, fill #1

## 2023-05-15 NOTE — Progress Notes (Signed)
Specialty Pharmacy Refill Coordination Note  Jillian Lutz is a 72 y.o. female, [name], RN patient's congregational nurse  contacted today regarding refills of specialty medication(s) Capecitabine   Patient requested [name] at [facility] at [place] date: 05/24/23   Medication will be filled on 05/24/23.

## 2023-05-15 NOTE — Progress Notes (Signed)
Nutrition Follow-up:  Patient with colorectal cancer. She is receiving oral xeloda + Capeox q6w.   Met with patient in infusion. Interpretor present for visit. Patient reports appetite has been pretty good in the last week. She is eating 2 meals and a snack (bone broth soups, rice, chicken, greens). Patient endorses 2 weeks of poor appetite and fatigue following infusions. Patient has not been drinking Ensure recently as she does not like chocolate flavor. Patient is adding this to coffee as a creamer. She tried [employer] during infusion. Patient consumed ~half of this. Says it was okay. She prefers vanilla flavor.    Medications: decadron (10/29)  Labs: Hgb 9.7, glucose 142  Anthropometrics: Wt 95 lb 12 oz today - trending down  10/14 - 98 lb 11.2 oz  9/16 - 102 lb 3.2 oz   3% wt loss in 2 weeks, 7% in the last 6 weeks - this is severe for time frame  NUTRITION DIAGNOSIS: Food and nutrition related knowledge deficit - continues    INTERVENTION:  Encouraged 3-4 Ensure Plus/equivalent daily Encouraged small frequent meals/snacks Contacted Congregational Nurse [name]) to inquire about ONS at home   MONITORING, EVALUATION, GOAL: wt trends, intake  NEXT VISIT: Tuesday December 10 during infusion

## 2023-05-15 NOTE — Congregational Nurse Program (Signed)
Accompanied patient to appointment at [facility]. Assisted patient with transportation problem via phone with [facility] Medicaid.  Patient arrived late for appointment but was seen and reeived IV chemo treatment.  CN picked up dexamethazone from [facility] pharmacy and will take it to patient tomorrow.  Patient started Xeloda today.  CN will scheduled transportation for next appointments. [name] RN, Congregational Nurse [phone]

## 2023-05-15 NOTE — Progress Notes (Signed)
[facility]   Telephone:(336) [phone] Fax:(336) [phone]   Clinic Follow up Note   Patient Care Team: [name], MD as PCP - General [name], RN (Inactive) as Oncology Nurse Navigator [name], NP as Nurse Practitioner (Nurse Practitioner) Malachy Mood, MD as Consulting Physician (Hematology and Oncology) Dorothy Puffer, MD as Consulting Physician (Radiation Oncology) [name], Netty Starring., MD as Consulting Physician (Gastroenterology) Josephine Igo, DO as Consulting Physician (Pulmonary Disease) Karie Soda, MD as Consulting Physician (General Surgery)  Date of Service:  05/15/2023  CHIEF COMPLAINT: f/u of metastatic rectal cancer  CURRENT THERAPY:  CapeOx  Oncology History   Rectal adenocarcinoma (HCC) cT3cN1M0 stage IIIb, biopsy confirmed residual primary tumor and oligo lung met in 09/2021 -Initially diagnosed 11/2020 by colonoscopy for progressive rectal pain and bleeding, weight loss, and constipation. -Despite strong recommendation, patient firmly and repeatedly declined IV chemo and surgery. She agreed to chemoRT with Xeloda, and received the treatment 01/04/21 - 02/14/21. -she developed local cancer progression and lung mets in 11/2021 -she started Xeloda on 02/16/2022, low dose oxaliplatin was added on 05/11/22 (with cycle 4 of Xeloda)  -chemo held since 07/2022 due to leg pain and recurrent nausea  -restaging CT 09/07/2022 showed stable disease in rectum and lung, no new lesions.   -she had chemo break from Feb-May 2024 -restart CAPOX with dose reduction 12/18/2022, not tolerating very well overall.  Oxaliplatin was held on August 5.  -Restaging CT scan from March 26, 2023 showed stable rectal wall thickening, no other  evidence of metastasis    Assessment and Plan    Metastatic Rectal Cancer Good response to treatment with oral chemotherapy. Tumor marker decreased from 63 to 11 over the past 5 months. No new symptoms reported.  Fatigue noted, possibly related to chemotherapy. -Continue oral chemotherapy regimen. -Administer intravenous chemotherapy today. -Plan for next intravenous chemotherapy in 6 weeks. -Repeat tumor marker and imaging in December or January. -Prescribe short course of dexamethasone for fatigue, to be taken for 3-5 days starting tomorrow.  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 No children in the area to assist. -Encourage patient to seek additional support as needed.  Follow-up Plans -Return in 3 weeks for labs and to start new cycle of oral chemotherapy. -Return in 6 weeks for next intravenous chemotherapy. -Print schedule of appointments for patient and caregiver.         SUMMARY OF ONCOLOGIC HISTORY: Oncology History Overview Note  Cancer Staging Rectal adenocarcinoma [facility]) Staging form: Colon and Rectum, AJCC 8th Edition - Clinical stage from 12/21/2020: Stage IIIB (cT3, cN1, cM0) - Unsigned    Rectal adenocarcinoma (HCC)  08/26/2020 Miscellaneous   Initial presentation to PCP, reporting intermittent BRBPR since 02/2020    12/01/2020 Procedure   Colonoscopy by Dr. Lavon Paganini findings - The perianal and digital rectal examinations were normal. - A 15 mm polyp was found in the ascending colon. The polyp was semi-pedunculated. Resection and retrieval were complete. - A 25 mm polyp was found in the sigmoid colon. The polyp was pedunculated. Resection and retrieval were complete. - An infiltrative partially obstructing large mass was found in the proximal rectum. The mass was partially circumferential (involving one-half of the lumen circumference). The mass measured eight cm in length extending from 10 to 18cm from anal verge. This was biopsied with a cold forceps for histology. Proximal and distal opposite fold area of the mass lesion was tattooed with an injection of total 3 mL of  Spot (carbon black).   12/01/2020 Initial Biopsy   Diagnosis 1. Colon,  polyp(s), ascending x 1 - ADENOCARCINOMA ARISING IN A TUBULAR ADENOMA WITH HIGH-GRADE DYSPLASIA. SEE NOTE 2. Colon, sigmoid polyp, x1 - TUBULOVILLOUS ADENOMA(S) - NEGATIVE FOR HIGH-GRADE DYSPLASIA OR MALIGNANCY 3. Rectum, biopsy - ADENOCARCINOMA. SEE NOTE   12/01/2020 Cancer Staging   Cancer Staging Rectal adenocarcinoma [facility]) Staging form: Colon and Rectum, AJCC 8th Edition - Clinical stage from 12/21/2020: Stage IIIB (cT3, cN1, cM0) - Unsigned    12/06/2020 Imaging   CT CAP with contrast IMPRESSION: 1. There is partially circumferential soft tissue thickening of the mid to superior rectum, approximately 5 cm in length and the inferior extent approximately 6 cm above the anal verge, consistent with primary rectal malignancy identified by colonoscopy. 2. There appear to be abnormally enlarged perirectal lymph nodes posteriorly about the superior rectum measuring up to 1.0 x 0.8 cm. Findings are suspicious for perirectal nodal metastatic disease, however rectal MRI is the test of choice for initial local staging of rectal cancer. 3. There is a 4 mm nonspecific pulmonary nodule of the superior segment left lower lobe, statistically most likely incidental, infectious or inflammatory, although nonspecific and isolated metastatic disease is not strictly excluded. Attention on follow-up. 4. No other evidence of metastatic disease in the chest, abdomen, or pelvis. Aortic Atherosclerosis (ICD10-I70.0).   12/09/2020 Imaging   Local staging MRI pelvis without contrast IMPRESSION: 4.9 cm circumferential mid/lower rectal mass, corresponding to the patient's newly diagnosed rectal cancer. Rectal adenocarcinoma T stage: T3c Rectal adenocarcinoma N stage:  N1 Distance from tumor to the internal anal sphincter is 3.7 cm.   12/21/2020 Initial Diagnosis   Rectal adenocarcinoma (HCC)   01/04/2021 -  Chemotherapy   Concurrent chemoradiation with Xeloda 1000mg  in the AM and 1500mg  in the PM on days  of Radiation starting 01/04/21.       01/04/2021 - 02/11/2021 Radiation Therapy   Concurrent chemoradiation with Dr Mitzi Hansen and Xeloda starting 01/04/21.    01/31/2022 Procedure   Colonoscopy, Dr. Lavon Paganini  Findings: - An infiltrative partially obstructing large mass was found in the rectum. The mass was circumferential. The mass measured ten cm in length, extending from 5-15cm from anal verge. In addition, rectal diameter at narrowest approx twelve mm. Oozing was present. Biopsies were taken with a cold forceps for histology.  Impression: - Hemorrhoids found on perianal exam. - Likely malignant partially obstructing tumor in the rectum. Biopsied. - Non-bleeding external and internal hemorrhoids.   01/31/2022 Pathology Results   Diagnosis Rectum, biopsy INVASIVE MODERATELY DIFFERENTIATED ADENOCARCINOMA   05/11/2022 -  Chemotherapy   Patient is on Treatment Plan : COLORECTAL Xelox (Capeox)(130/850) q21d     09/07/2022 Imaging    IMPRESSION: 1. No significant change in circumferential wall thickening of the rectum, in keeping with known primary rectal mass. Similar appearance of post treatment perirectal and presacral fat stranding and fascial thickening 2. Unchanged nodule of the superior segment left lower lobe with adjacent bandlike scarring. No new nodules. 3. No evidence of lymphadenopathy or other metastatic disease in the chest, abdomen, or pelvis. 4. Large burden of stool throughout the colon. 5. Cardiomegaly.   12/08/2022 Imaging    IMPRESSION: Persistent wall thickening along the rectum with adjacent severe inflammatory stranding and nodularity. Please correlate with exact location of the distribution of the patient's neoplasm.   No discrete new mass lesion, fluid collection or lymph node enlargement.   Improved visualization of the fractures involving the left side of the  pubic symphysis as well as probable insufficiency fractures along the sacrum. Associated areas  of increasing heterogeneous bony sclerosis along the pubic bones and sacrum. In principle sclerotic bone metastases would be in the differential but are felt to be less likely based on the overall appearance. If needed confirmatory MRI can be considered as clinically appropriate to further delineate.   Stable scarring and fibrotic changes along the lungs with a tiny nodular area along the superior segment of the left lower lobe. Simple continued follow up.   Enlarged heart.      Discussed the use of AI scribe software for clinical note transcription with the patient, who gave verbal consent to proceed.  History of Present Illness   The patient, with a history of metastatic rectal cancer, presents for a follow-up visit. She has been on oral chemotherapy, which she has tolerated well. The last intravenous chemotherapy was administered six weeks ago. The patient reports significant fatigue, which has been affecting her quality of life.         All other systems were reviewed with the patient and are negative.  MEDICAL HISTORY:  Past Medical History:  Diagnosis Date   Bilateral wrist pain 10/25/2017   Hypertension    Left leg DVT (deep venous thrombosis) (HCC) 06/2022   Neck mass 1998   Unknown biopsy results. Excised in [place].   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   Rectal cancer (HCC)    Trigger finger, acquired 02/08/2017    SURGICAL HISTORY: Past Surgical History:  Procedure Laterality Date   BRONCHIAL BIOPSY  12/13/2021   Procedure: BRONCHIAL BIOPSIES;  Surgeon: Josephine Igo, DO;  Location: [facility];  Service: Pulmonary;;   BRONCHIAL NEEDLE ASPIRATION BIOPSY  12/13/2021   Procedure: BRONCHIAL NEEDLE ASPIRATION BIOPSIES;  Surgeon: Josephine Igo, DO;  Location: [facility];  Service: Pulmonary;;   NECK SURGERY Left 1998   Excision of mass in [place]   VIDEO BRONCHOSCOPY WITH RADIAL ENDOBRONCHIAL ULTRASOUND  12/13/2021    Procedure: RADIAL ENDOBRONCHIAL ULTRASOUND;  Surgeon: Josephine Igo, DO;  Location: [facility];  Service: Pulmonary;;    I have reviewed the social history and family history with the patient and they are unchanged from previous note.  ALLERGIES:  is allergic to no known allergies.  MEDICATIONS:  Current Outpatient Medications  Medication Sig Dispense Refill   dexamethasone (DECADRON) 4 MG tablet Take 1 tablet (4 mg total) by mouth daily. TAKE DAILY FOR 3-5 DAYS AFTER IV CHEMO 20 tablet 1   amLODipine (NORVASC) 5 MG tablet Take 1 tablet (5 mg total) by mouth daily. 90 tablet 3   brimonidine (ALPHAGAN) 0.2 % ophthalmic solution Place 1 drop into the right eye 2 (two) times daily. 15 mL 3   brimonidine (ALPHAGAN) 0.2 % ophthalmic solution Place 1 drop into the right eye 2 (two) times daily. 10 mL 11   capecitabine (XELODA) 500 MG tablet Take 2 tabs every 12 hours, for 14 days then off for 7 days. Take after meal 56 tablet 2   dorzolamide-timolol (COSOPT) 2-0.5 % ophthalmic solution Place 1 drop into both eyes 2 times daily. 30 mL 3   dorzolamide-timolol (COSOPT) 2-0.5 % ophthalmic solution Instill 1 drop into both eyes twice a day 10 mL 11   gabapentin (NEURONTIN) 100 MG capsule Take 1 capsule (100 mg total) by mouth 3 (three) times daily as needed. 90 capsule 2   latanoprost (XALATAN) 0.005 % ophthalmic solution Instill 1 drop  into both eyes every night at bedtime 7.5 mL 3   latanoprost (XALATAN) 0.005 % ophthalmic solution Place 1 drop into both eyes at bedtime. 7.5 mL 6   lidocaine (XYLOCAINE) 5 % ointment Apply to anal area daily as needed. 50 g 1   lisinopril-hydrochlorothiazide (ZESTORETIC) 20-12.5 MG tablet Take 2 tablets by mouth daily. 60 tablet 3   mirtazapine (REMERON) 30 MG tablet Take 1 tablet (30 mg total) by mouth at bedtime. 30 tablet 1   Multiple Vitamins-Minerals (MULTIVITAMIN WITH MINERALS) tablet Take 1 tablet by mouth daily. Gummy 50 mg     ondansetron (ZOFRAN)  8 MG tablet Take 1 tablet (8 mg total) by mouth every 8 (eight) hours as needed for nausea or vomiting. Take after 3 days of chemo 30 tablet 2   polyethylene glycol powder (GLYCOLAX/MIRALAX) 17 GM/SCOOP powder Take 1 Container by mouth once.     prochlorperazine (COMPAZINE) 10 MG tablet Take 1 tablet (10 mg total) by mouth every 6 (six) hours as needed. 30 tablet 2   rivaroxaban (XARELTO) 20 MG TABS tablet Take 1 tablet (20 mg total) by mouth daily with supper. 30 tablet 2   traMADol (ULTRAM) 50 MG tablet Take 1 tablet (50 mg total) by mouth every 8 (eight) hours as needed. 60 tablet 0   urea (CARMOL) 10 % cream Apply topically 2 times daily as needed. 85 g 3   No current facility-administered medications for this visit.    PHYSICAL EXAMINATION: ECOG PERFORMANCE STATUS: 1 - Symptomatic but completely ambulatory  There were no vitals filed for this visit. Wt Readings from Last 3 Encounters:  05/15/23 95 lb 12 oz (43.4 kg)  04/30/23 98 lb 11.2 oz (44.8 kg)  04/10/23 99 lb 3.2 oz (45 kg)     GENERAL:alert, no distress and comfortable SKIN: skin color, texture, turgor are normal, no rashes or significant lesions EYES: normal, Conjunctiva are pink and non-injected, sclera clear NECK: supple, thyroid normal size, non-tender, without nodularity LYMPH:  no palpable lymphadenopathy in the cervical, axillary  LUNGS: clear to auscultation and percussion with normal breathing effort HEART: regular rate & rhythm and no murmurs and no lower extremity edema ABDOMEN:abdomen soft, non-tender and normal bowel sounds Musculoskeletal:no cyanosis of digits and no clubbing  NEURO: alert & oriented x 3 with fluent speech, no focal motor/sensory deficits   LABORATORY DATA:  I have reviewed the data as listed    Latest Ref Rng & Units 05/15/2023   10:49 AM 04/30/2023    9:55 AM 04/02/2023    9:13 AM  CBC  WBC 4.0 - 10.5 K/uL 4.1  3.9  4.1   Hemoglobin 12.0 - 15.0 g/dL 9.7  01.0  27.2   Hematocrit  36.0 - 46.0 % 29.3  32.1  32.2   Platelets 150 - 400 K/uL 220  266  261         Latest Ref Rng & Units 05/15/2023   10:49 AM 04/30/2023    9:55 AM 04/02/2023    9:13 AM  CMP  Glucose 70 - 99 mg/dL 536  644  034   BUN 8 - 23 mg/dL 17  15  11    Creatinine 0.44 - 1.00 mg/dL 7.42  5.95  6.38   Sodium 135 - 145 mmol/L 138  139  137   Potassium 3.5 - 5.1 mmol/L 3.8  3.7  3.9   Chloride 98 - 111 mmol/L 104  102  103   CO2 22 - 32 mmol/L 28  31  28   Calcium 8.9 - 10.3 mg/dL 9.0  9.2  8.7   Total Protein 6.5 - 8.1 g/dL 7.1  7.3  7.1   Total Bilirubin 0.3 - 1.2 mg/dL 0.4  0.3  0.4   Alkaline Phos 38 - 126 U/L 65  66  68   AST 15 - 41 U/L 16  16  15    ALT 0 - 44 U/L <5  <5  <5       RADIOGRAPHIC STUDIES: I have personally reviewed the radiological images as listed and agreed with the findings in the report. No results found.    Orders Placed This Encounter  Procedures   CBC with Differential (Cancer Center Only)    Standing Status:   Future    Standing Expiration Date:   08/07/2024   CMP (Cancer Center only)    Standing Status:   Future    Standing Expiration Date:   08/07/2024   All questions were answered. The patient knows to call the clinic with any problems, questions or concerns. No barriers to learning was detected. The total time spent in the appointment was 30 minutes.     Malachy Mood, MD 05/15/2023

## 2023-05-15 NOTE — Patient Instructions (Signed)
[facility]  Discharge Instructions: Thank you for choosing [facility] to provide your oncology and hematology care.   If you have a lab appointment with the Cancer Center, please go directly to the Cancer Center and check in at the registration area.   Wear comfortable clothing and clothing appropriate for easy access to any Portacath or PICC line.   We strive to give you quality time with your provider. You may need to reschedule your appointment if you arrive late (15 or more minutes).  Arriving late affects you and other patients whose appointments are after yours.  Also, if you miss three or more appointments without notifying the office, you may be dismissed from the clinic at the provider's discretion.      For prescription refill requests, have your pharmacy contact our office and allow 72 hours for refills to be completed.    Today you received the following chemotherapy and/or immunotherapy agents: oxaliplatin       To help prevent nausea and vomiting after your treatment, we encourage you to take your nausea medication as directed.  BELOW ARE SYMPTOMS THAT SHOULD BE REPORTED IMMEDIATELY: *FEVER GREATER THAN 100.4 F (38 C) OR HIGHER *CHILLS OR SWEATING *NAUSEA AND VOMITING THAT IS NOT CONTROLLED WITH YOUR NAUSEA MEDICATION *UNUSUAL SHORTNESS OF BREATH *UNUSUAL BRUISING OR BLEEDING *URINARY PROBLEMS (pain or burning when urinating, or frequent urination) *BOWEL PROBLEMS (unusual diarrhea, constipation, pain near the anus) TENDERNESS IN MOUTH AND THROAT WITH OR WITHOUT PRESENCE OF ULCERS (sore throat, sores in mouth, or a toothache) UNUSUAL RASH, SWELLING OR PAIN  UNUSUAL VAGINAL DISCHARGE OR ITCHING   Items with * indicate a potential emergency and should be followed up as soon as possible or go to the Emergency Department if any problems should occur.  Please show the CHEMOTHERAPY ALERT CARD or IMMUNOTHERAPY ALERT CARD at  check-in to the Emergency Department and triage nurse.  Should you have questions after your visit or need to cancel or reschedule your appointment, please contact [facility]  Dept: [phone]  and follow the prompts.  Office hours are 8:00 a.m. to 4:30 p.m. Monday - Friday. Please note that voicemails left after 4:00 p.m. may not be returned until the following business day.  We are closed weekends and major holidays. You have access to a nurse at all times for urgent questions. Please call the main number to the clinic Dept: [phone] and follow the prompts.   For any non-urgent questions, you may also contact your provider using MyChart. We now offer e-Visits for anyone 18 and older to request care online for non-urgent symptoms. For details visit [email or web address].   Also download the MyChart app! Go to the app store, search "MyChart", open the app, select [facility], and log in with your MyChart username and password.   

## 2023-05-16 ENCOUNTER — Other Ambulatory Visit: Payer: Self-pay

## 2023-05-16 NOTE — Progress Notes (Signed)
Prescription for Ensure/Boost sent to [facility] [phone]) along with MD Note, pt demographics, and Nutritionist Note.  Fax confirmation received.

## 2023-05-16 NOTE — Congregational Nurse Program (Signed)
Home visit to deliver dexamethazone which was picked up yesterday from [facility] pharmacy.  Reviewed instructions with patient and she expressed understanding.  [name] RN, Congregational Nurse [phone]

## 2023-05-24 ENCOUNTER — Other Ambulatory Visit (HOSPITAL_COMMUNITY): Payer: Self-pay

## 2023-05-27 ENCOUNTER — Other Ambulatory Visit: Payer: Self-pay

## 2023-05-29 DIAGNOSIS — C20 Malignant neoplasm of rectum: Secondary | ICD-10-CM | POA: Diagnosis not present

## 2023-05-30 NOTE — Congregational Nurse Program (Signed)
Home visit with interpreter [name].  We informed patient that today's visit with palliative care nurse Raynelle Fanning has to be rescheduled.  Patient states she needs medication refills for sleep and pain medications.  She has several tramadol remaining and will request refill from Dr. Mosetta Putt next week.  She has 1 refill remaining on mirtazapine.  CN called in request and will pick up tomorrow and take to patient.  She has not received Boost supply so CN contacted [facility] who stated it was mailed yesterday and she should  receive in next 1-2 days.  She requested her husband be able to accompany her to [facility] appointment on 06/05/2023.  CN will notify transportation and cancer center.  [name] RN, Congregational Nurse [phone]

## 2023-05-31 ENCOUNTER — Other Ambulatory Visit (HOSPITAL_COMMUNITY): Payer: Self-pay

## 2023-05-31 NOTE — Congregational Nurse Program (Signed)
Home visit to deliver mirtazapine which was picked up from [facility] pharmacy.  Also called medicaid transportation for 06/05/23 and 06/26/23 at [facility].  [name] RN, Congregational Nurse [phone]

## 2023-06-05 ENCOUNTER — Encounter: Payer: Self-pay | Admitting: Hematology

## 2023-06-05 ENCOUNTER — Inpatient Hospital Stay: Payer: Medicaid Other

## 2023-06-05 ENCOUNTER — Inpatient Hospital Stay (HOSPITAL_BASED_OUTPATIENT_CLINIC_OR_DEPARTMENT_OTHER): Payer: Medicaid Other | Admitting: Hematology

## 2023-06-05 ENCOUNTER — Other Ambulatory Visit (HOSPITAL_COMMUNITY): Payer: Self-pay

## 2023-06-05 ENCOUNTER — Inpatient Hospital Stay: Payer: Medicaid Other | Attending: Physician Assistant

## 2023-06-05 VITALS — BP 129/89 | HR 69 | Temp 98.7°F | Resp 17 | Wt 96.4 lb

## 2023-06-05 DIAGNOSIS — D649 Anemia, unspecified: Secondary | ICD-10-CM | POA: Diagnosis not present

## 2023-06-05 DIAGNOSIS — K6289 Other specified diseases of anus and rectum: Secondary | ICD-10-CM

## 2023-06-05 DIAGNOSIS — Z86718 Personal history of other venous thrombosis and embolism: Secondary | ICD-10-CM

## 2023-06-05 DIAGNOSIS — C20 Malignant neoplasm of rectum: Secondary | ICD-10-CM | POA: Diagnosis not present

## 2023-06-05 DIAGNOSIS — T451X5A Adverse effect of antineoplastic and immunosuppressive drugs, initial encounter: Secondary | ICD-10-CM | POA: Diagnosis not present

## 2023-06-05 DIAGNOSIS — R112 Nausea with vomiting, unspecified: Secondary | ICD-10-CM | POA: Insufficient documentation

## 2023-06-05 DIAGNOSIS — Z7901 Long term (current) use of anticoagulants: Secondary | ICD-10-CM | POA: Insufficient documentation

## 2023-06-05 DIAGNOSIS — C7800 Secondary malignant neoplasm of unspecified lung: Secondary | ICD-10-CM | POA: Insufficient documentation

## 2023-06-05 DIAGNOSIS — I82432 Acute embolism and thrombosis of left popliteal vein: Secondary | ICD-10-CM

## 2023-06-05 DIAGNOSIS — Z923 Personal history of irradiation: Secondary | ICD-10-CM | POA: Insufficient documentation

## 2023-06-05 DIAGNOSIS — Z9221 Personal history of antineoplastic chemotherapy: Secondary | ICD-10-CM | POA: Insufficient documentation

## 2023-06-05 DIAGNOSIS — D509 Iron deficiency anemia, unspecified: Secondary | ICD-10-CM

## 2023-06-05 DIAGNOSIS — Z95828 Presence of other vascular implants and grafts: Secondary | ICD-10-CM | POA: Insufficient documentation

## 2023-06-05 LAB — CMP (CANCER CENTER ONLY)
ALT: <5 U/L (ref 0–44)
AST: 17 U/L (ref 15–41)
Albumin: 3.7 g/dL (ref 3.5–5.0)
Alkaline Phosphatase: 67 U/L (ref 38–126)
Anion gap: 5 (ref 5–15)
BUN: 14 mg/dL (ref 8–23)
CO2: 27 mmol/L (ref 22–32)
Calcium: 8.9 mg/dL (ref 8.9–10.3)
Chloride: 107 mmol/L (ref 98–111)
Creatinine: 0.71 mg/dL (ref 0.44–1.00)
GFR, Estimated: >60 mL/min (ref >60)
Glucose, Bld: 78 mg/dL (ref 70–99)
Potassium: 3.7 mmol/L (ref 3.5–5.1)
Sodium: 139 mmol/L (ref 135–145)
Total Bilirubin: 0.3 mg/dL (ref <1.2)
Total Protein: 7.3 g/dL (ref 6.5–8.1)

## 2023-06-05 LAB — CBC WITH DIFFERENTIAL (CANCER CENTER ONLY)
Abs Immature Granulocytes: 0.02 10*3/uL (ref 0.00–0.07)
Basophils Absolute: 0 10*3/uL (ref 0.0–0.1)
Basophils Relative: 1 %
Eosinophils Absolute: 0.1 10*3/uL (ref 0.0–0.5)
Eosinophils Relative: 2 %
HCT: 30.7 % — ABNORMAL LOW (ref 36.0–46.0)
Hemoglobin: 10.4 g/dL — ABNORMAL LOW (ref 12.0–15.0)
Immature Granulocytes: 1 %
Lymphocytes Relative: 26 %
Lymphs Abs: 1.1 10*3/uL (ref 0.7–4.0)
MCH: 26.3 pg (ref 26.0–34.0)
MCHC: 33.9 g/dL (ref 30.0–36.0)
MCV: 77.7 fL — ABNORMAL LOW (ref 80.0–100.0)
Monocytes Absolute: 0.4 10*3/uL (ref 0.1–1.0)
Monocytes Relative: 8 %
Neutro Abs: 2.7 10*3/uL (ref 1.7–7.7)
Neutrophils Relative %: 62 %
Platelet Count: 250 10*3/uL (ref 150–400)
RBC: 3.95 MIL/uL (ref 3.87–5.11)
RDW: 16.7 % — ABNORMAL HIGH (ref 11.5–15.5)
WBC Count: 4.3 10*3/uL (ref 4.0–10.5)
nRBC: 0 % (ref 0.0–0.2)

## 2023-06-05 LAB — TOTAL PROTEIN, URINE DIPSTICK: Protein, ur: NEGATIVE mg/dL

## 2023-06-05 LAB — CEA (ACCESS): CEA (CHCC): 12.89 ng/mL — ABNORMAL HIGH (ref 0.00–5.00)

## 2023-06-05 LAB — FERRITIN: Ferritin: 22 ng/mL (ref 11–307)

## 2023-06-05 MED ORDER — LIDOCAINE 5 % EX OINT
1.0000 | TOPICAL_OINTMENT | Freq: Every day | CUTANEOUS | 1 refills | Status: AC
  Filled 2023-06-05: qty 50, 34d supply, fill #0
  Filled 2023-06-05: qty 50, 23d supply, fill #0

## 2023-06-05 MED ORDER — HEPARIN SOD (PORK) LOCK FLUSH 100 UNIT/ML IV SOLN: 500.0000 [IU] | Freq: Once | INTRAVENOUS | Status: DC

## 2023-06-05 MED ORDER — TRAMADOL HCL 50 MG PO TABS
50.0000 mg | ORAL_TABLET | Freq: Four times a day (QID) | ORAL | 0 refills | Status: DC | PRN
  Filled 2023-06-05: qty 90, 23d supply, fill #0

## 2023-06-05 MED ORDER — SODIUM CHLORIDE 0.9% FLUSH: 10.0000 mL | Freq: Once | INTRAVENOUS | Status: DC

## 2023-06-05 NOTE — Congregational Nurse Program (Signed)
Accompanied patient to Cancer Center appointment with Dr. Mosetta Putt.  Stated she had no appetite for 2 weeks following last chemo treatment.  She did receive Boost shipment which she has been drinking.  Stated tramadol which she takes am and pm helps pain and she requested refill. ( CN picked it up at [facility] pharmacy and gave it to patient. ) She also needed refill for lidocaine ointment which they did not have but will send over to [facility] pharmacy.  CN will pick it up tomorrow as well as the Latanoprost eye drops.  Scheduled appointment for C-T scan on 07/16/2023 at 3:00 pm with arrival time at 12:30.  She will drink contrast @ 1:00 and 2:00 pm.  CN will request Medicaid transportation.  [name] RN, Congregational Nurse [phone]

## 2023-06-05 NOTE — Assessment & Plan Note (Signed)
-  Doppler on July 13, 2022 showed left acute DVT involving popliteal and peroneal vein, CTA chest was negative for PE. -continue Xarelto, tolerating well

## 2023-06-05 NOTE — Progress Notes (Signed)
[facility]   Telephone:([phone] Fax:([phone]) [phone]   Clinic Follow up Note   Patient Care Team: [name], MD as PCP - General [name], RN (Inactive) as Oncology Nurse Navigator [name], NP as Nurse Practitioner (Nurse Practitioner) Malachy Mood, MD as Consulting Physician (Hematology and Oncology) Dorothy Puffer, MD as Consulting Physician (Radiation Oncology) [name], Netty Starring., MD as Consulting Physician (Gastroenterology) Josephine Igo, DO as Consulting Physician (Pulmonary Disease) Karie Soda, MD as Consulting Physician (General Surgery)  Date of Service:  06/05/2023  CHIEF COMPLAINT: f/u of rectal cancer   CURRENT THERAPY:  CAPOX   Oncology History   Rectal adenocarcinoma (HCC) cT3cN1M0 stage IIIb, biopsy confirmed residual primary tumor and oligo lung met in 09/2021 -Initially diagnosed 11/2020 by colonoscopy for progressive rectal pain and bleeding, weight loss, and constipation. -Despite strong recommendation, patient firmly and repeatedly declined IV chemo and surgery. She agreed to chemoRT with Xeloda, and received the treatment 01/04/21 - 02/14/21. -she developed local cancer progression and lung mets in 11/2021 -she started Xeloda on 02/16/2022, low dose oxaliplatin was added on 05/11/22 (with cycle 4 of Xeloda)  -chemo held since 07/2022 due to leg pain and recurrent nausea  -restaging CT 09/07/2022 showed stable disease in rectum and lung, no new lesions.   -she had chemo break from Feb-May 2024 -restart CAPOX with dose reduction 12/18/2022, not tolerating very well overall.  Oxaliplatin was held on August 5.  -Restaging CT scan from March 26, 2023 showed stable rectal wall thickening, no other  evidence of metastasis   Left leg DVT (HCC) -Doppler on July 13, 2022 showed left acute DVT involving popliteal and peroneal vein, CTA chest was negative for PE. -continue Xarelto, tolerating well    Assessment and  Plan    Rectal Cancer 72 year old with rectal cancer undergoing IV chemotherapy every 6 weeks and oral capecitabine (Xeloda) for 21 days. Reports significant side effects from oxaliplatin including prolonged anorexia and fatigue lasting almost two weeks post-chemotherapy. Mild anemia noted but improved from last visit. Next CT scan planned to assess disease status in Jan. Discussed the importance of continuing IV chemotherapy for disease control if side effects are manageable, as oral chemotherapy alone may not be sufficient. Considered dose reduction of IV chemotherapy if side effects persist. - Refill tramadol for pain management (90 tablets) - Continue oral capecitabine (Xeloda) 2 tablets in the morning and 2 tablets in the evening for 14 days then off for 7 days - Schedule CT scan for end of Dec  - Review CT scan results in early January - Monitor tumor markers - Consider dose reduction of IV chemotherapy if side effects persist - Refill lidocaine ointment for rectal pain  Chemotherapy-Induced Nausea and Vomiting Uses antiemetic medication as needed for chemotherapy-induced nausea and vomiting. No current need for refill. - Continue antiemetic medication as needed  General Health Maintenance Reports good bowel movements with the use of Miralax and a high-fiber diet. No current constipation issues. - Continue high-fiber diet and Miralax as needed  Follow-up - Follow-up appointment on December 10 for infusion - Call to schedule CT scan for the last week of December - Review CT scan results in early January.  F/u in 3 weeks with next Oxaliplatin/beva infusion         SUMMARY OF ONCOLOGIC HISTORY: Oncology History Overview Note  Cancer Staging Rectal adenocarcinoma [facility]) Staging form: Colon and Rectum, AJCC 8th Edition - Clinical stage from 12/21/2020: Stage IIIB (cT3, cN1, cM0) -  Unsigned    Rectal adenocarcinoma (HCC)  08/26/2020 Miscellaneous   Initial presentation to PCP,  reporting intermittent BRBPR since 02/2020    12/01/2020 Procedure   Colonoscopy by Dr. Lavon Paganini findings - The perianal and digital rectal examinations were normal. - A 15 mm polyp was found in the ascending colon. The polyp was semi-pedunculated. Resection and retrieval were complete. - A 25 mm polyp was found in the sigmoid colon. The polyp was pedunculated. Resection and retrieval were complete. - An infiltrative partially obstructing large mass was found in the proximal rectum. The mass was partially circumferential (involving one-half of the lumen circumference). The mass measured eight cm in length extending from 10 to 18cm from anal verge. This was biopsied with a cold forceps for histology. Proximal and distal opposite fold area of the mass lesion was tattooed with an injection of total 3 mL of Spot (carbon black).   12/01/2020 Initial Biopsy   Diagnosis 1. Colon, polyp(s), ascending x 1 - ADENOCARCINOMA ARISING IN A TUBULAR ADENOMA WITH HIGH-GRADE DYSPLASIA. SEE NOTE 2. Colon, sigmoid polyp, x1 - TUBULOVILLOUS ADENOMA(S) - NEGATIVE FOR HIGH-GRADE DYSPLASIA OR MALIGNANCY 3. Rectum, biopsy - ADENOCARCINOMA. SEE NOTE   12/01/2020 Cancer Staging   Cancer Staging Rectal adenocarcinoma [facility]) Staging form: Colon and Rectum, AJCC 8th Edition - Clinical stage from 12/21/2020: Stage IIIB (cT3, cN1, cM0) - Unsigned    12/06/2020 Imaging   CT CAP with contrast IMPRESSION: 1. There is partially circumferential soft tissue thickening of the mid to superior rectum, approximately 5 cm in length and the inferior extent approximately 6 cm above the anal verge, consistent with primary rectal malignancy identified by colonoscopy. 2. There appear to be abnormally enlarged perirectal lymph nodes posteriorly about the superior rectum measuring up to 1.0 x 0.8 cm. Findings are suspicious for perirectal nodal metastatic disease, however rectal MRI is the test of choice for initial local staging of  rectal cancer. 3. There is a 4 mm nonspecific pulmonary nodule of the superior segment left lower lobe, statistically most likely incidental, infectious or inflammatory, although nonspecific and isolated metastatic disease is not strictly excluded. Attention on follow-up. 4. No other evidence of metastatic disease in the chest, abdomen, or pelvis. Aortic Atherosclerosis (ICD10-I70.0).   12/09/2020 Imaging   Local staging MRI pelvis without contrast IMPRESSION: 4.9 cm circumferential mid/lower rectal mass, corresponding to the patient's newly diagnosed rectal cancer. Rectal adenocarcinoma T stage: T3c Rectal adenocarcinoma N stage:  N1 Distance from tumor to the internal anal sphincter is 3.7 cm.   12/21/2020 Initial Diagnosis   Rectal adenocarcinoma (HCC)   01/04/2021 -  Chemotherapy   Concurrent chemoradiation with Xeloda 1000mg  in the AM and 1500mg  in the PM on days of Radiation starting 01/04/21.       01/04/2021 - 02/11/2021 Radiation Therapy   Concurrent chemoradiation with Dr Mitzi Hansen and Xeloda starting 01/04/21.    01/31/2022 Procedure   Colonoscopy, Dr. Lavon Paganini  Findings: - An infiltrative partially obstructing large mass was found in the rectum. The mass was circumferential. The mass measured ten cm in length, extending from 5-15cm from anal verge. In addition, rectal diameter at narrowest approx twelve mm. Oozing was present. Biopsies were taken with a cold forceps for histology.  Impression: - Hemorrhoids found on perianal exam. - Likely malignant partially obstructing tumor in the rectum. Biopsied. - Non-bleeding external and internal hemorrhoids.   01/31/2022 Pathology Results   Diagnosis Rectum, biopsy INVASIVE MODERATELY DIFFERENTIATED ADENOCARCINOMA   05/11/2022 -  Chemotherapy   Patient is  on Treatment Plan : COLORECTAL Xelox (Capeox)(130/850) q21d     09/07/2022 Imaging    IMPRESSION: 1. No significant change in circumferential wall thickening of the rectum,  in keeping with known primary rectal mass. Similar appearance of post treatment perirectal and presacral fat stranding and fascial thickening 2. Unchanged nodule of the superior segment left lower lobe with adjacent bandlike scarring. No new nodules. 3. No evidence of lymphadenopathy or other metastatic disease in the chest, abdomen, or pelvis. 4. Large burden of stool throughout the colon. 5. Cardiomegaly.   12/08/2022 Imaging    IMPRESSION: Persistent wall thickening along the rectum with adjacent severe inflammatory stranding and nodularity. Please correlate with exact location of the distribution of the patient's neoplasm.   No discrete new mass lesion, fluid collection or lymph node enlargement.   Improved visualization of the fractures involving the left side of the pubic symphysis as well as probable insufficiency fractures along the sacrum. Associated areas of increasing heterogeneous bony sclerosis along the pubic bones and sacrum. In principle sclerotic bone metastases would be in the differential but are felt to be less likely based on the overall appearance. If needed confirmatory MRI can be considered as clinically appropriate to further delineate.   Stable scarring and fibrotic changes along the lungs with a tiny nodular area along the superior segment of the left lower lobe. Simple continued follow up.   Enlarged heart.      Discussed the use of AI scribe software for clinical note transcription with the patient, who gave verbal consent to proceed.  History of Present Illness   A 72 year old female with a history of rectal cancer presents for a follow-up visit. She reports that her pain is well-managed with tramadol, which she takes twice daily. However, she experiences a burning sensation in her stomach and a significant decrease in appetite following her chemotherapy treatments. These side effects last for almost two weeks, leaving her feeling very tired.  Despite these challenges, she reports that her bowel movements are regular, aided by a diet rich in fruits and the use of Miralax as needed. She also uses a lidocaine ointment for pain relief, which she reports as beneficial and requests a refill.         All other systems were reviewed with the patient and are negative.  MEDICAL HISTORY:  Past Medical History:  Diagnosis Date   Bilateral wrist pain 10/25/2017   Hypertension    Left leg DVT (deep venous thrombosis) (HCC) 06/2022   Neck mass 1998   Unknown biopsy results. Excised in [place].   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   Rectal cancer (HCC)    Trigger finger, acquired 02/08/2017    SURGICAL HISTORY: Past Surgical History:  Procedure Laterality Date   BRONCHIAL BIOPSY  12/13/2021   Procedure: BRONCHIAL BIOPSIES;  Surgeon: Josephine Igo, DO;  Location: [facility];  Service: Pulmonary;;   BRONCHIAL NEEDLE ASPIRATION BIOPSY  12/13/2021   Procedure: BRONCHIAL NEEDLE ASPIRATION BIOPSIES;  Surgeon: Josephine Igo, DO;  Location: [facility];  Service: Pulmonary;;   NECK SURGERY Left 1998   Excision of mass in [place]   VIDEO BRONCHOSCOPY WITH RADIAL ENDOBRONCHIAL ULTRASOUND  12/13/2021   Procedure: RADIAL ENDOBRONCHIAL ULTRASOUND;  Surgeon: Josephine Igo, DO;  Location: [facility];  Service: Pulmonary;;    I have reviewed the social history and family history with the patient and they are unchanged from previous note.  ALLERGIES:  is allergic to no  known allergies.  MEDICATIONS:  Current Outpatient Medications  Medication Sig Dispense Refill   amLODipine (NORVASC) 5 MG tablet Take 1 tablet (5 mg total) by mouth daily. 90 tablet 3   brimonidine (ALPHAGAN) 0.2 % ophthalmic solution Place 1 drop into the right eye 2 (two) times daily. 15 mL 3   brimonidine (ALPHAGAN) 0.2 % ophthalmic solution Place 1 drop into the right eye 2 (two) times daily. 10 mL 11   capecitabine (XELODA)  500 MG tablet Take 2 tabs every 12 hours, for 14 days then off for 7 days. Take after meal 56 tablet 2   dexamethasone (DECADRON) 4 MG tablet Take 1 tablet (4 mg total) by mouth daily. TAKE DAILY FOR 3-5 DAYS AFTER IV CHEMO 20 tablet 1   dorzolamide-timolol (COSOPT) 2-0.5 % ophthalmic solution Place 1 drop into both eyes 2 times daily. 30 mL 3   dorzolamide-timolol (COSOPT) 2-0.5 % ophthalmic solution Instill 1 drop into both eyes twice a day 10 mL 11   gabapentin (NEURONTIN) 100 MG capsule Take 1 capsule (100 mg total) by mouth 3 (three) times daily as needed. 90 capsule 2   latanoprost (XALATAN) 0.005 % ophthalmic solution Instill 1 drop into both eyes every night at bedtime 7.5 mL 3   latanoprost (XALATAN) 0.005 % ophthalmic solution Place 1 drop into both eyes at bedtime. 7.5 mL 6   lidocaine (XYLOCAINE) 5 % ointment Apply to anal area daily as needed. 50 g 1   lisinopril-hydrochlorothiazide (ZESTORETIC) 20-12.5 MG tablet Take 2 tablets by mouth daily. 60 tablet 3   mirtazapine (REMERON) 30 MG tablet Take 1 tablet (30 mg total) by mouth at bedtime. 30 tablet 1   Multiple Vitamins-Minerals (MULTIVITAMIN WITH MINERALS) tablet Take 1 tablet by mouth daily. Gummy 50 mg     ondansetron (ZOFRAN) 8 MG tablet Take 1 tablet (8 mg total) by mouth every 8 (eight) hours as needed for nausea or vomiting. Take after 3 days of chemo 30 tablet 2   polyethylene glycol powder (GLYCOLAX/MIRALAX) 17 GM/SCOOP powder Take 1 Container by mouth once.     prochlorperazine (COMPAZINE) 10 MG tablet Take 1 tablet (10 mg total) by mouth every 6 (six) hours as needed. 30 tablet 2   rivaroxaban (XARELTO) 20 MG TABS tablet Take 1 tablet (20 mg total) by mouth daily with supper. 30 tablet 2   traMADol (ULTRAM) 50 MG tablet Take 1 tablet (50 mg total) by mouth every 6 (six) hours as needed. 90 tablet 0   urea (CARMOL) 10 % cream Apply topically 2 times daily as needed. 85 g 3   No current facility-administered  medications for this visit.    PHYSICAL EXAMINATION: ECOG PERFORMANCE STATUS: 2 - Symptomatic, <50% confined to bed  Vitals:   06/05/23 1044  BP: 129/89  Pulse: 69  Resp: 17  Temp: 98.7 F (37.1 C)  SpO2: 99%   Wt Readings from Last 3 Encounters:  06/05/23 96 lb 6.4 oz (43.7 kg)  05/15/23 95 lb 12 oz (43.4 kg)  04/30/23 98 lb 11.2 oz (44.8 kg)     GENERAL:alert, no distress and comfortable SKIN: skin color, texture, turgor are normal, no rashes or significant lesions EYES: normal, Conjunctiva are pink and non-injected, sclera clear NECK: supple, thyroid normal size, non-tender, without nodularity LYMPH:  no palpable lymphadenopathy in the cervical, axillary  LUNGS: clear to auscultation and percussion with normal breathing effort HEART: regular rate & rhythm and no murmurs and no lower extremity edema ABDOMEN:abdomen soft,  non-tender and normal bowel sounds Musculoskeletal:no cyanosis of digits and no clubbing  NEURO: alert & oriented x 3 with fluent speech, no focal motor/sensory deficits    LABORATORY DATA:  I have reviewed the data as listed    Latest Ref Rng & Units 06/05/2023   10:33 AM 05/15/2023   10:49 AM 04/30/2023    9:55 AM  CBC  WBC 4.0 - 10.5 K/uL 4.3  4.1  3.9   Hemoglobin 12.0 - 15.0 g/dL 64.3  9.7  32.9   Hematocrit 36.0 - 46.0 % 30.7  29.3  32.1   Platelets 150 - 400 K/uL 250  220  266         Latest Ref Rng & Units 06/05/2023   10:33 AM 05/15/2023   10:49 AM 04/30/2023    9:55 AM  CMP  Glucose 70 - 99 mg/dL 78  518  841   BUN 8 - 23 mg/dL 14  17  15    Creatinine 0.44 - 1.00 mg/dL 6.60  6.30  1.60   Sodium 135 - 145 mmol/L 139  138  139   Potassium 3.5 - 5.1 mmol/L 3.7  3.8  3.7   Chloride 98 - 111 mmol/L 107  104  102   CO2 22 - 32 mmol/L 27  28  31    Calcium 8.9 - 10.3 mg/dL 8.9  9.0  9.2   Total Protein 6.5 - 8.1 g/dL 7.3  7.1  7.3   Total Bilirubin <1.2 mg/dL 0.3  0.4  0.3   Alkaline Phos 38 - 126 U/L 67  65  66   AST 15 - 41 U/L  17  16  16    ALT 0 - 44 U/L <5  <5  <5       RADIOGRAPHIC STUDIES: I have personally reviewed the radiological images as listed and agreed with the findings in the report. No results found.    Orders Placed This Encounter  Procedures   CT CHEST ABDOMEN PELVIS W CONTRAST    Standing Status:   Future    Standing Expiration Date:   06/04/2024    Order Specific Question:   If indicated for the ordered procedure, I authorize the administration of contrast media per Radiology protocol    Answer:   Yes    Order Specific Question:   Does the patient have a contrast media/X-ray dye allergy?    Answer:   No    Order Specific Question:   Preferred imaging location?    Answer:   [facility]    Order Specific Question:   If indicated for the ordered procedure, I authorize the administration of oral contrast media per Radiology protocol    Answer:   Yes   All questions were answered. The patient knows to call the clinic with any problems, questions or concerns. No barriers to learning was detected. The total time spent in the appointment was 30 minutes.     Malachy Mood, MD 06/05/2023

## 2023-06-05 NOTE — Assessment & Plan Note (Signed)
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 -Despite strong recommendation, patient firmly and repeatedly declined IV chemo and surgery. She agreed to chemoRT with Xeloda, and received the treatment 01/04/21 - 02/14/21. -she developed local cancer progression and lung mets in 11/2021 -she started Xeloda on 02/16/2022, low dose oxaliplatin was added on 05/11/22 (with cycle 4 of Xeloda)  -chemo held since 07/2022 due to leg pain and recurrent nausea  -restaging CT 09/07/2022 showed stable disease in rectum and lung, no new lesions.   -she had chemo break from Feb-May 2024 -restart CAPOX with dose reduction 12/18/2022, not tolerating very well overall.  Oxaliplatin was held on August 5.  -Restaging CT scan from March 26, 2023 showed stable rectal wall thickening, no other  evidence of metastasis

## 2023-06-06 ENCOUNTER — Other Ambulatory Visit: Payer: Self-pay

## 2023-06-06 NOTE — Congregational Nurse Program (Signed)
Home visit with interpreter [name].  Delivered latanoprost eye drops and lidocaine ointment which were picked up from [facility] out-patient pharmacy.  Reviewed instructions for both.  She will keep eye drops refrigerated.   Informed patient that C-T scan is scheduled for 07/16/2023 at 3:00 pm.  She will need to arrive at 12:30 pm and will drink contrast @ 1:00 and 2:00 pm.  CN will arrange transportation (Medicaid).  We asked patient if she is interested in having a port for her IV chemo but she declined at present.  ( This was in response to message from Dr. Latanya Maudlin nurse).  Helped patient pay [facility] Bill ($12) by phone.  [name] RN, Congregational Nurse [phone]

## 2023-06-06 NOTE — Progress Notes (Signed)
Specialty Pharmacy Refill Coordination Note  Jillian Lutz is a 72 y.o. female contacted today regarding refills of specialty medication(s) Capecitabine Spoke with [name], Engineer, water.  Patient requested Pickup at [facility] at [place] date: 06/20/23   Medication will be filled on 06/19/23.

## 2023-06-19 ENCOUNTER — Other Ambulatory Visit: Payer: Self-pay

## 2023-06-21 NOTE — Congregational Nurse Program (Signed)
Home visit to drop off Xeloda which was picked up from [facility].  Reviewed instructions.  Patient stated she knows how to take it.  Reminded her of [facility]23 [facility] appointment and that transportation should arrive at 8:45 am.  [name] RN, Congregational Nurse [phone]

## 2023-06-25 ENCOUNTER — Telehealth: Payer: Self-pay

## 2023-06-25 NOTE — Telephone Encounter (Signed)
Phone call to patient to remind her of 9:30 appointment tomorrow with [facility].  She stated she has been sick today with diarrhea, vomiting, headache and fever.  Will call patient back at 8:00 am tomorrow and notify provider of symptoms. Advised patient she will most likely need to reschedule appointment.  [name] RN, Congregational nurse [phone]

## 2023-06-25 NOTE — Assessment & Plan Note (Signed)
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 -Despite strong recommendation, patient firmly and repeatedly declined IV chemo and surgery. She agreed to chemoRT with Xeloda, and received the treatment 01/04/21 - 02/14/21. -she developed local cancer progression and lung mets in 11/2021 -she started Xeloda on 02/16/2022, low dose oxaliplatin was added on 05/11/22 (with cycle 4 of Xeloda)  -chemo held since 07/2022 due to leg pain and recurrent nausea  -restaging CT 09/07/2022 showed stable disease in rectum and lung, no new lesions.   -she had chemo break from Feb-May 2024 -restart CAPOX with dose reduction 12/18/2022, not tolerating very well overall.  Oxaliplatin was held on August 5.  -Restaging CT scan from March 26, 2023 showed stable rectal wall thickening, no other  evidence of metastasis

## 2023-06-26 ENCOUNTER — Inpatient Hospital Stay: Payer: Medicaid Other | Admitting: Dietician

## 2023-06-26 ENCOUNTER — Inpatient Hospital Stay: Payer: Medicaid Other | Admitting: Hematology

## 2023-06-26 ENCOUNTER — Inpatient Hospital Stay: Payer: Medicaid Other

## 2023-06-26 DIAGNOSIS — C20 Malignant neoplasm of rectum: Secondary | ICD-10-CM

## 2023-06-26 NOTE — Congregational Nurse Program (Signed)
Home visit (outside) to take Covid test kit to patient.  No interpreter available today to explain instructions.  Will call patient back tomorrow on Facetime with interpreter [name] to assist patient in performing test.  Advised her that appointment with Cancer Center was cancelled but we need to wait on results of Covid test before rescheduling per Dr. Latanya Maudlin assistant.  [name] RN,  Congregational Nurse [phone]

## 2023-06-27 ENCOUNTER — Telehealth: Payer: Self-pay

## 2023-06-27 NOTE — Telephone Encounter (Signed)
Facetime call to patient with interpreter [name].  Instructed patient how to do Covid test.  Result was negative.  Will call Cancer Center regarding rescheduling appointment and when she should start Xeloda.  [name] RN, Congregational Nurse [phone]

## 2023-06-28 ENCOUNTER — Telehealth: Payer: Self-pay

## 2023-06-28 NOTE — Telephone Encounter (Signed)
Phone call to patient to let her know that Dr. Mosetta Putt recommended she go to urgent care or PCP since she is still experiencing headache, sore throat and possible fever.  She will ask her friend [employer] to take her.  [name] RN, Congregational Nurse [phone]

## 2023-06-30 ENCOUNTER — Other Ambulatory Visit: Payer: Self-pay

## 2023-07-02 ENCOUNTER — Telehealth: Payer: Self-pay

## 2023-07-02 NOTE — Telephone Encounter (Signed)
Phone cal to patient's case manager at [facility] [employer]) stating she had spoken with patient regarding assisting with transportation to Urgent Care.  No one was available to take patient and by yesterday patient stated she felt much better and was leaving to go to [place] and will return to [place] on 07/11/2023.  [name] RN, Congregational Nurse [phone]

## 2023-07-04 ENCOUNTER — Telehealth: Payer: Self-pay | Admitting: Hematology

## 2023-07-06 ENCOUNTER — Encounter: Payer: Medicaid Other | Admitting: Student

## 2023-07-09 ENCOUNTER — Other Ambulatory Visit: Payer: Self-pay

## 2023-07-12 ENCOUNTER — Telehealth: Payer: Self-pay

## 2023-07-12 NOTE — Telephone Encounter (Signed)
Requested transportation for the following appointments: 1) 07/16/2023 12:30 at [facility] for C-T scan.  Pick-up time 11:45 am and return home pick-up is 4:15 pm.  2) 07/23/2023 11:15 at [facility].  Pick-up time 10:30 am and will call for return home.  CN will inform patient.  [name] RN, Congregational Nurse [phone]

## 2023-07-16 ENCOUNTER — Ambulatory Visit (HOSPITAL_COMMUNITY)
Admission: RE | Admit: 2023-07-16 | Discharge: 2023-07-16 | Disposition: A | Discharge status: Home / Self Care | Payer: Medicaid Other | Source: Ambulatory Visit | Attending: Hematology | Admitting: Hematology

## 2023-07-16 DIAGNOSIS — C20 Malignant neoplasm of rectum: Secondary | ICD-10-CM | POA: Insufficient documentation

## 2023-07-16 MED ORDER — IOHEXOL 300 MG/ML  SOLN
30.0000 mL | Freq: Once | INTRAMUSCULAR | Status: AC | PRN
  Administered 2023-07-16: 30 mL via ORAL

## 2023-07-16 MED ORDER — IOHEXOL 300 MG/ML  SOLN
100.0000 mL | Freq: Once | INTRAMUSCULAR | Status: AC | PRN
  Administered 2023-07-16: 80 mL via INTRAVENOUS

## 2023-07-23 ENCOUNTER — Inpatient Hospital Stay: Payer: Medicaid Other

## 2023-07-23 ENCOUNTER — Other Ambulatory Visit (HOSPITAL_COMMUNITY): Payer: Self-pay

## 2023-07-23 ENCOUNTER — Other Ambulatory Visit: Payer: Self-pay

## 2023-07-23 ENCOUNTER — Encounter: Payer: Self-pay | Admitting: Hematology

## 2023-07-23 ENCOUNTER — Inpatient Hospital Stay: Payer: Medicaid Other | Attending: Physician Assistant | Admitting: Hematology

## 2023-07-23 ENCOUNTER — Other Ambulatory Visit: Payer: Self-pay | Admitting: Student

## 2023-07-23 VITALS — BP 113/70 | HR 61

## 2023-07-23 VITALS — BP 104/72 | HR 63 | Temp 98.6°F | Resp 20 | Ht <= 58 in | Wt 96.9 lb

## 2023-07-23 DIAGNOSIS — C20 Malignant neoplasm of rectum: Secondary | ICD-10-CM

## 2023-07-23 DIAGNOSIS — Z5111 Encounter for antineoplastic chemotherapy: Secondary | ICD-10-CM | POA: Diagnosis present

## 2023-07-23 DIAGNOSIS — I82432 Acute embolism and thrombosis of left popliteal vein: Secondary | ICD-10-CM | POA: Diagnosis not present

## 2023-07-23 DIAGNOSIS — Z7901 Long term (current) use of anticoagulants: Secondary | ICD-10-CM | POA: Diagnosis not present

## 2023-07-23 DIAGNOSIS — R630 Anorexia: Secondary | ICD-10-CM | POA: Diagnosis not present

## 2023-07-23 DIAGNOSIS — Z86718 Personal history of other venous thrombosis and embolism: Secondary | ICD-10-CM | POA: Diagnosis not present

## 2023-07-23 DIAGNOSIS — C7800 Secondary malignant neoplasm of unspecified lung: Secondary | ICD-10-CM | POA: Insufficient documentation

## 2023-07-23 DIAGNOSIS — K6289 Other specified diseases of anus and rectum: Secondary | ICD-10-CM | POA: Diagnosis not present

## 2023-07-23 DIAGNOSIS — I10 Essential (primary) hypertension: Secondary | ICD-10-CM

## 2023-07-23 DIAGNOSIS — D649 Anemia, unspecified: Secondary | ICD-10-CM | POA: Insufficient documentation

## 2023-07-23 DIAGNOSIS — D509 Iron deficiency anemia, unspecified: Secondary | ICD-10-CM

## 2023-07-23 LAB — CBC WITH DIFFERENTIAL (CANCER CENTER ONLY)
Abs Immature Granulocytes: 0.02 10*3/uL (ref 0.00–0.07)
Basophils Absolute: 0 10*3/uL (ref 0.0–0.1)
Basophils Relative: 1 %
Eosinophils Absolute: 0.1 10*3/uL (ref 0.0–0.5)
Eosinophils Relative: 2 %
HCT: 31 % — ABNORMAL LOW (ref 36.0–46.0)
Hemoglobin: 10.1 g/dL — ABNORMAL LOW (ref 12.0–15.0)
Immature Granulocytes: 0 %
Lymphocytes Relative: 14 %
Lymphs Abs: 0.8 10*3/uL (ref 0.7–4.0)
MCH: 25.2 pg — ABNORMAL LOW (ref 26.0–34.0)
MCHC: 32.6 g/dL (ref 30.0–36.0)
MCV: 77.3 fL — ABNORMAL LOW (ref 80.0–100.0)
Monocytes Absolute: 0.4 10*3/uL (ref 0.1–1.0)
Monocytes Relative: 8 %
Neutro Abs: 4.1 10*3/uL (ref 1.7–7.7)
Neutrophils Relative %: 75 %
Platelet Count: 305 10*3/uL (ref 150–400)
RBC: 4.01 MIL/uL (ref 3.87–5.11)
RDW: 17.2 % — ABNORMAL HIGH (ref 11.5–15.5)
WBC Count: 5.5 10*3/uL (ref 4.0–10.5)
nRBC: 0 % (ref 0.0–0.2)

## 2023-07-23 LAB — CMP (CANCER CENTER ONLY)
ALT: <5 U/L (ref 0–44)
AST: 14 U/L — ABNORMAL LOW (ref 15–41)
Albumin: 3.7 g/dL (ref 3.5–5.0)
Alkaline Phosphatase: 62 U/L (ref 38–126)
Anion gap: 6 (ref 5–15)
BUN: 16 mg/dL (ref 8–23)
CO2: 28 mmol/L (ref 22–32)
Calcium: 8.6 mg/dL — ABNORMAL LOW (ref 8.9–10.3)
Chloride: 102 mmol/L (ref 98–111)
Creatinine: 0.83 mg/dL (ref 0.44–1.00)
GFR, Estimated: >60 mL/min (ref >60)
Glucose, Bld: 122 mg/dL — ABNORMAL HIGH (ref 70–99)
Potassium: 3.5 mmol/L (ref 3.5–5.1)
Sodium: 136 mmol/L (ref 135–145)
Total Bilirubin: 0.3 mg/dL (ref 0.0–1.2)
Total Protein: 7.1 g/dL (ref 6.5–8.1)

## 2023-07-23 LAB — FERRITIN: Ferritin: 11 ng/mL (ref 11–307)

## 2023-07-23 LAB — CEA (ACCESS): CEA (CHCC): 18.73 ng/mL — ABNORMAL HIGH (ref 0.00–5.00)

## 2023-07-23 LAB — TOTAL PROTEIN, URINE DIPSTICK: Protein, ur: NEGATIVE mg/dL

## 2023-07-23 MED ORDER — DEXTROSE 5 % IV SOLN
Freq: Once | INTRAVENOUS | Status: AC
Start: 2023-07-23 — End: 2023-07-23

## 2023-07-23 MED ORDER — MIRTAZAPINE 30 MG PO TABS
30.0000 mg | ORAL_TABLET | Freq: Every day | ORAL | 1 refills | Status: DC
  Filled 2023-07-23: qty 30, 30d supply, fill #0
  Filled 2023-08-27 – 2023-08-28 (×2): qty 30, 30d supply, fill #1

## 2023-07-23 MED ORDER — DIPHENHYDRAMINE HCL 50 MG/ML IJ SOLN
25.0000 mg | Freq: Once | INTRAMUSCULAR | Status: AC
Start: 2023-07-23 — End: 2023-07-23
  Administered 2023-07-23: 25 mg via INTRAVENOUS
  Filled 2023-07-23: qty 1

## 2023-07-23 MED ORDER — RIVAROXABAN 20 MG PO TABS
20.0000 mg | ORAL_TABLET | Freq: Every day | ORAL | 2 refills | Status: DC
  Filled 2023-07-23: qty 30, 30d supply, fill #0
  Filled 2023-09-24: qty 30, 30d supply, fill #1
  Filled 2023-10-22: qty 30, 30d supply, fill #2

## 2023-07-23 MED ORDER — SODIUM CHLORIDE 0.9 % IV SOLN: Freq: Once | INTRAVENOUS | Status: AC

## 2023-07-23 MED ORDER — FAMOTIDINE IN NACL 20-0.9 MG/50ML-% IV SOLN
20.0000 mg | Freq: Once | INTRAVENOUS | Status: AC
  Administered 2023-07-23: 20 mg via INTRAVENOUS
  Filled 2023-07-23: qty 50

## 2023-07-23 MED ORDER — BEVACIZUMAB-ADCD CHEMO INJECTION 400 MG/16ML
7.5000 mg/kg | Freq: Once | INTRAVENOUS | Status: AC
  Administered 2023-07-23: 350 mg via INTRAVENOUS
  Filled 2023-07-23: qty 14

## 2023-07-23 MED ORDER — LISINOPRIL-HYDROCHLOROTHIAZIDE 20-12.5 MG PO TABS
2.0000 | ORAL_TABLET | Freq: Every day | ORAL | 3 refills | Status: DC
  Filled 2023-07-23: qty 60, 30d supply, fill #0
  Filled 2023-09-24: qty 60, 30d supply, fill #1
  Filled 2023-10-22: qty 60, 30d supply, fill #2
  Filled 2023-11-29: qty 60, 30d supply, fill #3

## 2023-07-23 MED ORDER — PALONOSETRON HCL INJECTION 0.25 MG/5ML
0.2500 mg | Freq: Once | INTRAVENOUS | Status: AC
  Administered 2023-07-23: 0.25 mg via INTRAVENOUS
  Filled 2023-07-23: qty 5

## 2023-07-23 MED ORDER — DEXAMETHASONE SODIUM PHOSPHATE 10 MG/ML IJ SOLN
10.0000 mg | Freq: Once | INTRAMUSCULAR | Status: AC
  Administered 2023-07-23: 10 mg via INTRAVENOUS
  Filled 2023-07-23: qty 1

## 2023-07-23 MED ORDER — CAPECITABINE 500 MG PO TABS
ORAL_TABLET | ORAL | 2 refills | Status: DC
  Filled 2023-07-23: qty 56, fill #0
  Filled 2023-08-01: qty 56, 14d supply, fill #0
  Filled 2023-08-23: qty 56, 14d supply, fill #1
  Filled 2023-09-14: qty 56, 14d supply, fill #2

## 2023-07-23 MED ORDER — TRAMADOL HCL 50 MG PO TABS
50.0000 mg | ORAL_TABLET | Freq: Four times a day (QID) | ORAL | 0 refills | Status: DC | PRN
  Filled 2023-07-23: qty 90, 23d supply, fill #0

## 2023-07-23 MED ORDER — DEXTROSE 5 % IV SOLN
70.0000 mg/m2 | Freq: Once | INTRAVENOUS | Status: AC
  Administered 2023-07-23: 100 mg via INTRAVENOUS
  Filled 2023-07-23: qty 20

## 2023-07-23 NOTE — Progress Notes (Signed)
 [facility]   [phone] Fax:([phone]) [phone]   Clinic Follow up Note   Patient Care Team: Gabino Boga, MD as PCP - General Lenon Channing CROME, RN (Inactive) as Oncology Nurse Navigator Burton, [name] K, NP as Nurse Practitioner (Nurse Practitioner) [name], MD as Consulting Physician (Hematology and Oncology) [name], MD as Consulting Physician (Radiation Oncology) [name], [name], MD as Consulting Physician (Gastroenterology) [name], DO as Consulting Physician (Pulmonary Disease) [name], MD as Consulting Physician (General Surgery) 07/23/2023  I connected with Jillian Lutz on 07/23/23 at 11:40 AM EST by telephone and verified that I am speaking with the correct person using two identifiers.   I discussed the limitations, risks, security and privacy concerns of performing an evaluation and management service by telephone and the availability of in person appointments. I also discussed with the patient that there may be a patient responsible charge related to this service. The patient expressed understanding and agreed to proceed.   Patient's location:   Office  Provider's location:  Home    CHIEF COMPLAINT: Follow-up of metastatic rectal cancer   CURRENT THERAPY: Xeloda  and bevacizumab  every 3 weeks, oxaliplatin  every 6 weeks  Oncology history Left leg DVT (HCC) -Doppler on July 13, 2022 showed left acute DVT involving popliteal and peroneal vein, CTA chest was negative for PE. -continue Xarelto , tolerating well    Rectal adenocarcinoma (HCC) cT3cN1M0 stage IIIb, biopsy confirmed residual primary tumor and oligo lung met in 09/2021 -Initially diagnosed 11/2020 by colonoscopy for progressive rectal pain and bleeding, weight loss, and constipation. -Despite strong recommendation, patient firmly and repeatedly declined IV chemo and surgery. She agreed to chemoRT with Xeloda , and received the treatment 01/04/21 -  02/14/21. -she developed local cancer progression and lung mets in 11/2021 -she started Xeloda  on 02/16/2022, low dose oxaliplatin  was added on 05/11/22 (with cycle 4 of Xeloda )  -chemo held since 07/2022 due to leg pain and recurrent nausea  -restaging CT 09/07/2022 showed stable disease in rectum and lung, no new lesions.   -she had chemo break from Feb-May 2024 -restart CAPOX with dose reduction 12/18/2022, not tolerating very well overall.  Oxaliplatin  was held on August 5.  -Restaging CT scan from March 26, 2023 showed stable rectal wall thickening, no other  evidence of metastasis    Assessment and Plan    Metastatic Rectal Cancer Follow-up for metastatic rectal cancer. Missed December appointment due to illness, recovered with acetaminophen . Last dose of Xeloda  taken this morning after a break since the last chemotherapy round. Reports improved appetite and occasional rectal bleeding. Blood counts show mild anemia. CT scan appears stable to my eyes, awaiting official radiology report. Discussed continuing Xeloda  and proceeding with intravenous chemotherapy today. Explained that bevacizumab  infusion every three weeks is recommended due to its minimal side effects, though it can increase hypertension and risk of bleeding and thromboembolism. Patient agreed to proceed with the treatment plan. - Continue Xeloda , 2 tablets twice a day after meals for 2 weeks - Proceed with intravenous chemotherapy today - Schedule next chemotherapy infusion in 6 weeks - Schedule bevacizumab  infusion every 3 weeks - Call radiology to expedite CT scan reading - Monitor blood counts and anemia  Pain Management Taking tramadol  twice daily for pain management. Reports needing a refill. - Refill tramadol  prescription at [facility] pharmacy  Post-Chemotherapy Management Takes dexamethasone  post-chemotherapy for energy and appetite. Prescription is empty. Limited to 3-5 days post-intravenous chemotherapy. - Refill  dexamethasone  prescription for use post-chemotherapy,  limited to 3-5 days  Low appetite  On mirtazapine  to aid with appetite. Prescription has run out. - Refill mirtazapine  prescription  Anticoagulation Therapy On Xarelto  for anticoagulation. Last prescription filled in October with one refill remaining. Will call in additional refill. - Refill Xarelto  prescription  Plan -lab reviewed, adequate for treatment, will proceed oxaliplatin  and beva today -will change beva from every 6 weeks to every 3 weeks  -continue xeloda  at home -I refill her meds  -f/u in 3 weeks  - Call patient with CT scan results once available.         SUMMARY OF ONCOLOGIC HISTORY: Oncology History Overview Note  Cancer Staging Rectal adenocarcinoma [facility]) Staging form: Colon and Rectum, AJCC 8th Edition - Clinical stage from 12/21/2020: Stage IIIB (cT3, cN1, cM0) - Unsigned    Rectal adenocarcinoma (HCC)  08/26/2020 Miscellaneous   Initial presentation to PCP, reporting intermittent BRBPR since 02/2020    12/01/2020 Procedure   Colonoscopy by Dr. Shila findings - The perianal and digital rectal examinations were normal. - A 15 mm polyp was found in the ascending colon. The polyp was semi-pedunculated. Resection and retrieval were complete. - A 25 mm polyp was found in the sigmoid colon. The polyp was pedunculated. Resection and retrieval were complete. - An infiltrative partially obstructing large mass was found in the proximal rectum. The mass was partially circumferential (involving one-half of the lumen circumference). The mass measured eight cm in length extending from 10 to 18cm from anal verge. This was biopsied with a cold forceps for histology. Proximal and distal opposite fold area of the mass lesion was tattooed with an injection of total 3 mL of Spot (carbon black).   12/01/2020 Initial Biopsy   Diagnosis 1. Colon, polyp(s), ascending x 1 - ADENOCARCINOMA ARISING IN A TUBULAR ADENOMA WITH  HIGH-GRADE DYSPLASIA. SEE NOTE 2. Colon, sigmoid polyp, x1 - TUBULOVILLOUS ADENOMA(S) - NEGATIVE FOR HIGH-GRADE DYSPLASIA OR MALIGNANCY 3. Rectum, biopsy - ADENOCARCINOMA. SEE NOTE   12/01/2020 Cancer Staging   Cancer Staging Rectal adenocarcinoma [facility]) Staging form: Colon and Rectum, AJCC 8th Edition - Clinical stage from 12/21/2020: Stage IIIB (cT3, cN1, cM0) - Unsigned    12/06/2020 Imaging   CT CAP with contrast IMPRESSION: 1. There is partially circumferential soft tissue thickening of the mid to superior rectum, approximately 5 cm in length and the inferior extent approximately 6 cm above the anal verge, consistent with primary rectal malignancy identified by colonoscopy. 2. There appear to be abnormally enlarged perirectal lymph nodes posteriorly about the superior rectum measuring up to 1.0 x 0.8 cm. Findings are suspicious for perirectal nodal metastatic disease, however rectal MRI is the test of choice for initial local staging of rectal cancer. 3. There is a 4 mm nonspecific pulmonary nodule of the superior segment left lower lobe, statistically most likely incidental, infectious or inflammatory, although nonspecific and isolated metastatic disease is not strictly excluded. Attention on follow-up. 4. No other evidence of metastatic disease in the chest, abdomen, or pelvis. Aortic Atherosclerosis (ICD10-I70.0).   12/09/2020 Imaging   Local staging MRI pelvis without contrast IMPRESSION: 4.9 cm circumferential mid/lower rectal mass, corresponding to the patient's newly diagnosed rectal cancer. Rectal adenocarcinoma T stage: T3c Rectal adenocarcinoma N stage:  N1 Distance from tumor to the internal anal sphincter is 3.7 cm.   12/21/2020 Initial Diagnosis   Rectal adenocarcinoma (HCC)   01/04/2021 -  Chemotherapy   Concurrent chemoradiation with Xeloda  1000mg  in the AM and 1500mg  in the PM on days of  Radiation starting 01/04/21.       01/04/2021 - 02/11/2021 Radiation  Therapy   Concurrent chemoradiation with Dr Dewey and Xeloda  starting 01/04/21.    01/31/2022 Procedure   Colonoscopy, Dr. Shila  Findings: - An infiltrative partially obstructing large mass was found in the rectum. The mass was circumferential. The mass measured ten cm in length, extending from 5-15cm from anal verge. In addition, rectal diameter at narrowest approx twelve mm. Oozing was present. Biopsies were taken with a cold forceps for histology.  Impression: - Hemorrhoids found on perianal exam. - Likely malignant partially obstructing tumor in the rectum. Biopsied. - Non-bleeding external and internal hemorrhoids.   01/31/2022 Pathology Results   Diagnosis Rectum, biopsy INVASIVE MODERATELY DIFFERENTIATED ADENOCARCINOMA   05/11/2022 -  Chemotherapy   Patient is on Treatment Plan : COLORECTAL Xelox (Capeox)(130/850) q21d     09/07/2022 Imaging    IMPRESSION: 1. No significant change in circumferential wall thickening of the rectum, in keeping with known primary rectal mass. Similar appearance of post treatment perirectal and presacral fat stranding and fascial thickening 2. Unchanged nodule of the superior segment left lower lobe with adjacent bandlike scarring. No new nodules. 3. No evidence of lymphadenopathy or other metastatic disease in the chest, abdomen, or pelvis. 4. Large burden of stool throughout the colon. 5. Cardiomegaly.   12/08/2022 Imaging    IMPRESSION: Persistent wall thickening along the rectum with adjacent severe inflammatory stranding and nodularity. Please correlate with exact location of the distribution of the patient's neoplasm.   No discrete new mass lesion, fluid collection or lymph node enlargement.   Improved visualization of the fractures involving the left side of the pubic symphysis as well as probable insufficiency fractures along the sacrum. Associated areas of increasing heterogeneous bony sclerosis along the pubic bones and  sacrum. In principle sclerotic bone metastases would be in the differential but are felt to be less likely based on the overall appearance. If needed confirmatory MRI can be considered as clinically appropriate to further delineate.   Stable scarring and fibrotic changes along the lungs with a tiny nodular area along the superior segment of the left lower lobe. Simple continued follow up.   Enlarged heart.     Discussed the use of AI scribe software for clinical note transcription with the patient, who gave verbal consent to proceed.  History of Present Illness   A 73 year old patient with a history of metastatic rectal cancer presents for a follow-up appointment. The patient missed her previous appointment in December due to illness and did not take her prescribed Xeloda  during that time. She resumed taking Xeloda  on the day of the current appointment. Since stopping Xeloda , the patient reports feeling better and has been eating more. However, she has experienced intermittent rectal bleeding. The patient also reports heart palpitations when taking Tramadol . She is also on other medications including a blood thinner (Xarelto ), a steroid (Dexamethasone ), and Mirtazapine .         REVIEW OF SYSTEMS:   Constitutional: Denies fevers, chills or abnormal weight loss Eyes: Denies blurriness of vision Ears, nose, mouth, throat, and face: Denies mucositis or sore throat Respiratory: Denies cough, dyspnea or wheezes Cardiovascular: Denies palpitation, chest discomfort or lower extremity swelling Gastrointestinal:  Denies nausea, heartburn or change in bowel habits Skin: Denies abnormal skin rashes Lymphatics: Denies new lymphadenopathy or easy bruising Neurological:Denies numbness, tingling or new weaknesses Behavioral/Psych: Mood is stable, no new changes  All other systems were reviewed with the patient and are negative.  MEDICAL HISTORY:  Past Medical History:  Diagnosis Date    Bilateral wrist pain 10/25/2017   Hypertension    Left leg DVT (deep venous thrombosis) (HCC) 06/2022   Neck mass 1998   Unknown biopsy results. Excised in [place].   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   Rectal cancer (HCC)    Trigger finger, acquired 02/08/2017    SURGICAL HISTORY: Past Surgical History:  Procedure Laterality Date   BRONCHIAL BIOPSY  12/13/2021   Procedure: BRONCHIAL BIOPSIES;  Surgeon: [name], DO;  Location: [facility];  Service: Pulmonary;;   BRONCHIAL NEEDLE ASPIRATION BIOPSY  12/13/2021   Procedure: BRONCHIAL NEEDLE ASPIRATION BIOPSIES;  Surgeon: [name], DO;  Location: [facility];  Service: Pulmonary;;   NECK SURGERY Left 1998   Excision of mass in [place]   VIDEO BRONCHOSCOPY WITH RADIAL ENDOBRONCHIAL ULTRASOUND  12/13/2021   Procedure: RADIAL ENDOBRONCHIAL ULTRASOUND;  Surgeon: [name], DO;  Location: [facility];  Service: Pulmonary;;    I have reviewed the social history and family history with the patient and they are unchanged from previous note.  ALLERGIES:  is allergic to no known allergies.  MEDICATIONS:  Current Outpatient Medications  Medication Sig Dispense Refill   amLODipine  (NORVASC ) 5 MG tablet Take 1 tablet (5 mg total) by mouth daily. 90 tablet 3   brimonidine  (ALPHAGAN ) 0.2 % ophthalmic solution Place 1 drop into the right eye 2 (two) times daily. 15 mL 3   brimonidine  (ALPHAGAN ) 0.2 % ophthalmic solution Place 1 drop into the right eye 2 (two) times daily. 10 mL 11   capecitabine  (XELODA ) 500 MG tablet Take 2 tabs every 12 hours, for 14 days then off for 7 days. Take 30mins after meal 56 tablet 2   dexamethasone  (DECADRON ) 4 MG tablet Take 1 tablet (4 mg total) by mouth daily. TAKE DAILY FOR 3-5 DAYS AFTER IV CHEMO 20 tablet 1   dorzolamide -timolol  (COSOPT ) 2-0.5 % ophthalmic solution Place 1 drop into both eyes 2 times daily. 30 mL 3   dorzolamide -timolol  (COSOPT ) 2-0.5 %  ophthalmic solution Instill 1 drop into both eyes twice a day 10 mL 11   gabapentin  (NEURONTIN ) 100 MG capsule Take 1 capsule (100 mg total) by mouth 3 (three) times daily as needed. 90 capsule 2   latanoprost  (XALATAN ) 0.005 % ophthalmic solution Instill 1 drop into both eyes every night at bedtime 7.5 mL 3   latanoprost  (XALATAN ) 0.005 % ophthalmic solution Place 1 drop into both eyes at bedtime. 7.5 mL 6   lidocaine  (XYLOCAINE ) 5 % ointment Apply to anal area daily as needed. 50 g 1   lisinopril -hydrochlorothiazide  (ZESTORETIC ) 20-12.5 MG tablet Take 2 tablets by mouth daily. 60 tablet 3   mirtazapine  (REMERON ) 30 MG tablet Take 1 tablet (30 mg total) by mouth at bedtime. 30 tablet 1   Multiple Vitamins-Minerals (MULTIVITAMIN WITH MINERALS) tablet Take 1 tablet by mouth daily. Gummy 50 mg     ondansetron  (ZOFRAN ) 8 MG tablet Take 1 tablet (8 mg total) by mouth every 8 (eight) hours as needed for nausea or vomiting. Take after 3 days of chemo 30 tablet 2   polyethylene glycol powder (GLYCOLAX/MIRALAX) 17 GM/SCOOP powder Take 1 Container by mouth once.     prochlorperazine  (COMPAZINE ) 10 MG tablet Take 1 tablet (10 mg total) by mouth every 6 (six) hours as needed. 30 tablet 2   rivaroxaban  (XARELTO ) 20 MG TABS tablet Take 1 tablet (20 mg total)  by mouth daily with supper. 30 tablet 2   traMADol  (ULTRAM ) 50 MG tablet Take 1 tablet (50 mg total) by mouth every 6 (six) hours as needed. 90 tablet 0   urea  (CARMOL) 10 % cream Apply topically 2 times daily as needed. 85 g 3   No current facility-administered medications for this visit.    PHYSICAL EXAMINATION: Not performed   LABORATORY DATA:  I have reviewed the data as listed    Latest Ref Rng & Units 07/23/2023   10:52 AM 06/05/2023   10:33 AM 05/15/2023   10:49 AM  CBC  WBC 4.0 - 10.5 K/uL 5.5  4.3  4.1   Hemoglobin 12.0 - 15.0 g/dL 89.8  89.5  9.7   Hematocrit 36.0 - 46.0 % 31.0  30.7  29.3   Platelets 150 - 400 K/uL 305  250  220          Latest Ref Rng & Units 07/23/2023   10:52 AM 06/05/2023   10:33 AM 05/15/2023   10:49 AM  CMP  Glucose 70 - 99 mg/dL 877  78  857   BUN 8 - 23 mg/dL 16  14  17    Creatinine 0.44 - 1.00 mg/dL 9.16  9.28  9.05   Sodium 135 - 145 mmol/L 136  139  138   Potassium 3.5 - 5.1 mmol/L 3.5  3.7  3.8   Chloride 98 - 111 mmol/L 102  107  104   CO2 22 - 32 mmol/L 28  27  28    Calcium 8.9 - 10.3 mg/dL 8.6  8.9  9.0   Total Protein 6.5 - 8.1 g/dL 7.1  7.3  7.1   Total Bilirubin 0.0 - 1.2 mg/dL 0.3  0.3  0.4   Alkaline Phos 38 - 126 U/L 62  67  65   AST 15 - 41 U/L 14  17  16    ALT 0 - 44 U/L <5  <5  <5       RADIOGRAPHIC STUDIES: I have personally reviewed the radiological images as listed and agreed with the findings in the report. No results found.     I discussed the assessment and treatment plan with the patient. The patient was provided an opportunity to ask questions and all were answered. The patient agreed with the plan and demonstrated an understanding of the instructions.   The patient was advised to call back or seek an in-person evaluation if the symptoms worsen or if the condition fails to improve as anticipated.  I provided 25 minutes of face-to-face video visit time during this encounter, and > 50% was spent counseling as documented under my assessment & plan.     Onita Mattock, MD 07/23/23

## 2023-07-23 NOTE — Congregational Nurse Program (Signed)
 Accompanied patient to appointment with Dr. Lanny at [facility].  States she has been eating better and having normal BM's. She reports having a small amount of rectal bleeding the third week of December.  The tramadol  which she takes twice a day helps the rectal pain .  She agreed to the treatment plan recommended by Dr. Lanny.  C-T scan has not been read by radiologist but Dr. Lanny reviewed it and said it looks stable.  They will call patient with results. Patient stated she is out of most of her medications.  CN called [facility] and picked up the following prescriptions from [facility]:  1)Gabapentin  2)Xarelto  3)Mirtazapine  4)Tramadol  5)Dexamethone and 6)Amlodipine .  Needs new prescription for Lisinopril .  CN will pick up from [facility] pharmacy when ready.  Reminded patient of follow-up appointment with eye doctor on 07/25/2023 @ 1:45 pm.  [name] RN, Congregational Nurse [phone]

## 2023-07-23 NOTE — Assessment & Plan Note (Signed)
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 -Despite strong recommendation, patient firmly and repeatedly declined IV chemo and surgery. She agreed to chemoRT with Xeloda, and received the treatment 01/04/21 - 02/14/21. -she developed local cancer progression and lung mets in 11/2021 -she started Xeloda on 02/16/2022, low dose oxaliplatin was added on 05/11/22 (with cycle 4 of Xeloda)  -chemo held since 07/2022 due to leg pain and recurrent nausea  -restaging CT 09/07/2022 showed stable disease in rectum and lung, no new lesions.   -she had chemo break from Feb-May 2024 -restart CAPOX with dose reduction 12/18/2022, not tolerating very well overall.  Oxaliplatin was held on August 5.  -Restaging CT scan from March 26, 2023 showed stable rectal wall thickening, no other  evidence of metastasis

## 2023-07-23 NOTE — Telephone Encounter (Signed)
 Medication sent to pharmacy

## 2023-07-23 NOTE — Assessment & Plan Note (Signed)
-  Doppler on July 13, 2022 showed left acute DVT involving popliteal and peroneal vein, CTA chest was negative for PE. -continue Xarelto, tolerating well

## 2023-07-23 NOTE — Patient Instructions (Signed)
 [facility] - A DEPT OF [facility]  Discharge Instructions: Thank you for choosing [facility] to provide your oncology and hematology care.   If you have a lab appointment with the Cancer Center, please go directly to the Cancer Center and check in at the registration area.   Wear comfortable clothing and clothing appropriate for easy access to any Portacath or PICC line.   We strive to give you quality time with your provider. You may need to reschedule your appointment if you arrive late (15 or more minutes).  Arriving late affects you and other patients whose appointments are after yours.  Also, if you miss three or more appointments without notifying the office, you may be dismissed from the clinic at the provider's discretion.      For prescription refill requests, have your pharmacy contact our office and allow 72 hours for refills to be completed.    Today you received the following chemotherapy and/or immunotherapy agents: Bevacizumab, Oxaliplatin.       To help prevent nausea and vomiting after your treatment, we encourage you to take your nausea medication as directed.  BELOW ARE SYMPTOMS THAT SHOULD BE REPORTED IMMEDIATELY: *FEVER GREATER THAN 100.4 F (38 C) OR HIGHER *CHILLS OR SWEATING *NAUSEA AND VOMITING THAT IS NOT CONTROLLED WITH YOUR NAUSEA MEDICATION *UNUSUAL SHORTNESS OF BREATH *UNUSUAL BRUISING OR BLEEDING *URINARY PROBLEMS (pain or burning when urinating, or frequent urination) *BOWEL PROBLEMS (unusual diarrhea, constipation, pain near the anus) TENDERNESS IN MOUTH AND THROAT WITH OR WITHOUT PRESENCE OF ULCERS (sore throat, sores in mouth, or a toothache) UNUSUAL RASH, SWELLING OR PAIN  UNUSUAL VAGINAL DISCHARGE OR ITCHING   Items with * indicate a potential emergency and should be followed up as soon as possible or go to the Emergency Department if any problems should occur.  Please show the CHEMOTHERAPY ALERT CARD  or IMMUNOTHERAPY ALERT CARD at check-in to the Emergency Department and triage nurse.  Should you have questions after your visit or need to cancel or reschedule your appointment, please contact [facility] - A DEPT OF [facility]  Dept: [phone]  and follow the prompts.  Office hours are 8:00 a.m. to 4:30 p.m. Monday - Friday. Please note that voicemails left after 4:00 p.m. may not be returned until the following business day.  We are closed weekends and major holidays. You have access to a nurse at all times for urgent questions. Please call the main number to the clinic Dept: [phone] and follow the prompts.   For any non-urgent questions, you may also contact your provider using MyChart. We now offer e-Visits for anyone 34 and older to request care online for non-urgent symptoms. For details visit [email or web address].   Also download the MyChart app! Go to the app store, search "MyChart", open the app, select [place], and log in with your MyChart username and password.

## 2023-07-23 NOTE — Congregational Nurse Program (Signed)
  Dept: [phone]   Congregational Nurse Program Note  Date of Encounter: 07/23/2023  Past Medical History: Past Medical History:  Diagnosis Date   Bilateral wrist pain 10/25/2017   Hypertension    Left leg DVT (deep venous thrombosis) (HCC) 06/2022   Neck mass 1998   Unknown biopsy results. Excised in [place].   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   Rectal cancer (HCC)    Trigger finger, acquired 02/08/2017    Encounter Details:  Community Questionnaire - 07/23/23 1610       Questionnaire   Ask client: Do you give verbal consent for me to treat you today? Yes    Student Assistance N/A    Location Patient Served  [place]    Encounter Setting Phone/Text/Email;MD Office/UC/ED    Population Status Migrant/Refugee    Insurance Medicaid    Insurance/Financial Assistance Referral N/A    Medication N/A    Medical Provider Yes    Screening Referrals Made N/A    Medical Referrals Made N/A    Medical Appointment Completed N/A    CNP Interventions Advocate/Support;Case Management;Navigate Healthcare System;Educate    Screenings CN Performed N/A    ED Visit Averted N/A    Life-Saving Intervention Made N/A      Questionnaire   Do you give verbal consent to treat you today? Yes    Location Patient Served  Not Applicable    Visit Setting Phone/Text/Email;Home    Patient Status Refugee    Insurance [facility]    Insurance Referral N/A    Medication N/A    Screening Referrals N/A    Housing/Utilities N/A    Intervention Support;Case Management

## 2023-07-23 NOTE — Addendum Note (Signed)
 Addended by: Malachy Mood on: 07/23/2023 01:59 PM   Modules accepted: Orders

## 2023-07-23 NOTE — Progress Notes (Signed)
 Day 22: bevacizumab only has been added to pt's tx plan per Dr. Mosetta Putt. For subsequent cycles, pt will receive Day 1: oxali + beva and Day 22: beva only. Urine protein required q 6 weeks/day 1 of each cycle.

## 2023-07-24 ENCOUNTER — Other Ambulatory Visit (HOSPITAL_COMMUNITY): Payer: Self-pay

## 2023-07-24 ENCOUNTER — Other Ambulatory Visit: Payer: Medicaid Other

## 2023-07-24 ENCOUNTER — Other Ambulatory Visit: Payer: Self-pay

## 2023-07-24 ENCOUNTER — Ambulatory Visit: Payer: Medicaid Other

## 2023-07-24 ENCOUNTER — Inpatient Hospital Stay: Payer: Medicaid Other | Admitting: Dietician

## 2023-07-24 ENCOUNTER — Ambulatory Visit: Payer: Medicaid Other | Admitting: Hematology

## 2023-07-25 DIAGNOSIS — Z961 Presence of intraocular lens: Secondary | ICD-10-CM | POA: Diagnosis not present

## 2023-07-25 DIAGNOSIS — H40031 Anatomical narrow angle, right eye: Secondary | ICD-10-CM | POA: Diagnosis not present

## 2023-07-25 DIAGNOSIS — H401113 Primary open-angle glaucoma, right eye, severe stage: Secondary | ICD-10-CM | POA: Diagnosis not present

## 2023-07-25 NOTE — Congregational Nurse Program (Signed)
 CN and interpreter [name] accompanied patient to eye doctor appointment with Dr. Glendia Gaudy.  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  Left eye remains healthy with little vision change.  Patient continues to use Lantanoprost  drops in both eyes.  Pressures good at 11 and 12.  Complaining of right eye watering.  She has not been using lubricating drops but will resume using 3-4 times a day.  She was given a sample bottle until she can purchase more. She was given a new prescription because she does not like bifocals.  Explained that she may have to pay out of pocket for new glasses and she was agreeable to this.  Scheduled for 6 month follow-up on 01/23/2024 @ 1:45 pm.  [name] RN, Congregational Nurse [phone]

## 2023-08-01 ENCOUNTER — Other Ambulatory Visit: Payer: Self-pay

## 2023-08-01 NOTE — Progress Notes (Signed)
 Specialty Pharmacy Ongoing Clinical Assessment Note  Jillian Lutz is a 73 y.o. female who is being followed by the specialty pharmacy service for RxSp Oncology   Patient's specialty medication(s) reviewed today: Capecitabine  (XELODA )   Missed doses in the last 4 weeks: 0   Patient/Caregiver did not have any additional questions or concerns.   Therapeutic benefit summary: Patient is achieving benefit   Adverse events/side effects summary: No adverse events/side effects   Patient's therapy is appropriate to: Continue    Goals Addressed             This Visit's Progress    Slow Disease Progression       Patient is on track. Patient will maintain adherence. Per 1/6 provider note scans do not appear to show progression.          Follow up:  6 months  Exavior Kimmons M Keandre Linden Specialty Pharmacist

## 2023-08-01 NOTE — Progress Notes (Signed)
 Specialty Pharmacy Refill Coordination Note  Jillian Lutz is a 73 y.o. female contacted today regarding refills of specialty medication(s) Capecitabine  (XELODA )   Patient requested [place] [facility] at [facility] at [place] date: 08/07/23   Medication will be filled on 08/06/23.

## 2023-08-06 ENCOUNTER — Other Ambulatory Visit: Payer: Self-pay

## 2023-08-07 ENCOUNTER — Other Ambulatory Visit (HOSPITAL_COMMUNITY): Payer: Self-pay

## 2023-08-08 ENCOUNTER — Telehealth: Payer: Self-pay

## 2023-08-08 NOTE — Telephone Encounter (Signed)
[employer] intern Jake Seats called [facility] Medicaid transportation to schedule ride for 08/13/2023 11:30 appointment at [facility].  She will picked up at 10:45 am.  [name] RN, Congregational Nurse [phone]

## 2023-08-10 NOTE — Congregational Nurse Program (Signed)
Home visit to deliver Xeloda which was picked up from [employer] on 08/07/2023.  Reviewed instructions with patient and she voiced understanding.  Reminded her of appointment on Monday 08/13/2023 @ 11:30 am and that she should be ready for pickup at 10:30 am.  Also discussed upcoming appointment with PCP on 08/27/2023 @ 3:15 pm.  [name] RN, Congregational Nurse [phone]

## 2023-08-12 NOTE — Assessment & Plan Note (Signed)
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 -Despite strong recommendation, patient firmly and repeatedly declined IV chemo and surgery. She agreed to chemoRT with Xeloda, and received the treatment 01/04/21 - 02/14/21. -she developed local cancer progression and lung mets in 11/2021 -she started Xeloda on 02/16/2022, low dose oxaliplatin was added on 05/11/22 (with cycle 4 of Xeloda)  -chemo held since 07/2022 due to leg pain and recurrent nausea  -restaging CT 09/07/2022 showed stable disease in rectum and lung, no new lesions.   -she had chemo break from Feb-May 2024 -restart CAPOX with dose reduction 12/18/2022, not tolerating very well overall.  Oxaliplatin was held on August 5.  -Restaging CT scan from 07/16/2023 showed stable rectal wall thickening, no other  evidence of metastasis

## 2023-08-13 ENCOUNTER — Inpatient Hospital Stay: Payer: Medicaid Other | Admitting: Nutrition

## 2023-08-13 ENCOUNTER — Inpatient Hospital Stay: Payer: Medicaid Other

## 2023-08-13 ENCOUNTER — Encounter: Payer: Self-pay | Admitting: Hematology

## 2023-08-13 ENCOUNTER — Inpatient Hospital Stay (HOSPITAL_BASED_OUTPATIENT_CLINIC_OR_DEPARTMENT_OTHER): Payer: Medicaid Other | Admitting: Hematology

## 2023-08-13 ENCOUNTER — Ambulatory Visit: Payer: Medicaid Other

## 2023-08-13 VITALS — BP 108/76 | HR 77 | Temp 98.2°F | Resp 18 | Wt 100.3 lb

## 2023-08-13 DIAGNOSIS — C20 Malignant neoplasm of rectum: Secondary | ICD-10-CM

## 2023-08-13 DIAGNOSIS — Z5111 Encounter for antineoplastic chemotherapy: Secondary | ICD-10-CM | POA: Diagnosis not present

## 2023-08-13 LAB — CEA (ACCESS): CEA (CHCC): 16.5 ng/mL — ABNORMAL HIGH (ref 0.00–5.00)

## 2023-08-13 LAB — CMP (CANCER CENTER ONLY)
ALT: 6 U/L (ref 0–44)
AST: 13 U/L — ABNORMAL LOW (ref 15–41)
Albumin: 3.4 g/dL — ABNORMAL LOW (ref 3.5–5.0)
Alkaline Phosphatase: 56 U/L (ref 38–126)
Anion gap: 5 (ref 5–15)
BUN: 17 mg/dL (ref 8–23)
CO2: 28 mmol/L (ref 22–32)
Calcium: 8.3 mg/dL — ABNORMAL LOW (ref 8.9–10.3)
Chloride: 103 mmol/L (ref 98–111)
Creatinine: 0.82 mg/dL (ref 0.44–1.00)
GFR, Estimated: >60 mL/min (ref >60)
Glucose, Bld: 131 mg/dL — ABNORMAL HIGH (ref 70–99)
Potassium: 3.7 mmol/L (ref 3.5–5.1)
Sodium: 136 mmol/L (ref 135–145)
Total Bilirubin: 0.3 mg/dL (ref 0.0–1.2)
Total Protein: 6.6 g/dL (ref 6.5–8.1)

## 2023-08-13 LAB — CBC WITH DIFFERENTIAL (CANCER CENTER ONLY)
Abs Immature Granulocytes: 0.05 10*3/uL (ref 0.00–0.07)
Basophils Absolute: 0 10*3/uL (ref 0.0–0.1)
Basophils Relative: 1 %
Eosinophils Absolute: 0.2 10*3/uL (ref 0.0–0.5)
Eosinophils Relative: 2 %
HCT: 31.2 % — ABNORMAL LOW (ref 36.0–46.0)
Hemoglobin: 10 g/dL — ABNORMAL LOW (ref 12.0–15.0)
Immature Granulocytes: 1 %
Lymphocytes Relative: 13 %
Lymphs Abs: 1.1 10*3/uL (ref 0.7–4.0)
MCH: 24.8 pg — ABNORMAL LOW (ref 26.0–34.0)
MCHC: 32.1 g/dL (ref 30.0–36.0)
MCV: 77.2 fL — ABNORMAL LOW (ref 80.0–100.0)
Monocytes Absolute: 1 10*3/uL (ref 0.1–1.0)
Monocytes Relative: 13 %
Neutro Abs: 5.7 10*3/uL (ref 1.7–7.7)
Neutrophils Relative %: 70 %
Platelet Count: 294 10*3/uL (ref 150–400)
RBC: 4.04 MIL/uL (ref 3.87–5.11)
RDW: 17.8 % — ABNORMAL HIGH (ref 11.5–15.5)
WBC Count: 8.1 10*3/uL (ref 4.0–10.5)
nRBC: 0 % (ref 0.0–0.2)

## 2023-08-13 MED ORDER — SODIUM CHLORIDE 0.9 % IV SOLN: Freq: Once | INTRAVENOUS | Status: AC

## 2023-08-13 MED ORDER — SODIUM CHLORIDE 0.9 % IV SOLN
7.5000 mg/kg | Freq: Once | INTRAVENOUS | Status: AC
  Administered 2023-08-13: 350 mg via INTRAVENOUS
  Filled 2023-08-13: qty 14

## 2023-08-13 NOTE — Patient Instructions (Signed)
[facility] - A DEPT OF [facility]  Discharge Instructions: Thank you for choosing [facility] to provide your oncology and hematology care.   If you have a lab appointment with the Cancer Center, please go directly to the Cancer Center and check in at the registration area.   Wear comfortable clothing and clothing appropriate for easy access to any Portacath or PICC line.   We strive to give you quality time with your provider. You may need to reschedule your appointment if you arrive late (15 or more minutes).  Arriving late affects you and other patients whose appointments are after yours.  Also, if you miss three or more appointments without notifying the office, you may be dismissed from the clinic at the provider's discretion.      For prescription refill requests, have your pharmacy contact our office and allow 72 hours for refills to be completed.    Today you received the following chemotherapy and/or immunotherapy agents: Vegzelma      To help prevent nausea and vomiting after your treatment, we encourage you to take your nausea medication as directed.  BELOW ARE SYMPTOMS THAT SHOULD BE REPORTED IMMEDIATELY: *FEVER GREATER THAN 100.4 F (38 C) OR HIGHER *CHILLS OR SWEATING *NAUSEA AND VOMITING THAT IS NOT CONTROLLED WITH YOUR NAUSEA MEDICATION *UNUSUAL SHORTNESS OF BREATH *UNUSUAL BRUISING OR BLEEDING *URINARY PROBLEMS (pain or burning when urinating, or frequent urination) *BOWEL PROBLEMS (unusual diarrhea, constipation, pain near the anus) TENDERNESS IN MOUTH AND THROAT WITH OR WITHOUT PRESENCE OF ULCERS (sore throat, sores in mouth, or a toothache) UNUSUAL RASH, SWELLING OR PAIN  UNUSUAL VAGINAL DISCHARGE OR ITCHING   Items with * indicate a potential emergency and should be followed up as soon as possible or go to the Emergency Department if any problems should occur.  Please show the CHEMOTHERAPY ALERT CARD or IMMUNOTHERAPY  ALERT CARD at check-in to the Emergency Department and triage nurse.  Should you have questions after your visit or need to cancel or reschedule your appointment, please contact [facility] - A DEPT OF [facility]  Dept: [phone]  and follow the prompts.  Office hours are 8:00 a.m. to 4:30 p.m. Monday - Friday. Please note that voicemails left after 4:00 p.m. may not be returned until the following business day.  We are closed weekends and major holidays. You have access to a nurse at all times for urgent questions. Please call the main number to the clinic Dept: [phone] and follow the prompts.   For any non-urgent questions, you may also contact your provider using MyChart. We now offer e-Visits for anyone 30 and older to request care online for non-urgent symptoms. For details visit [email or web address].   Also download the MyChart app! Go to the app store, search "MyChart", open the app, select [place], and log in with your MyChart username and password.

## 2023-08-13 NOTE — Progress Notes (Signed)
[facility]   [phone] [phone]   Clinic Follow up Note   Patient Care Team: [name], MD as PCP - General [name], RN (Inactive) as Oncology Nurse Navigator [name], NP as Nurse Practitioner (Nurse Practitioner) Malachy Mood, MD as Consulting Physician (Hematology and Oncology) Dorothy Puffer, MD as Consulting Physician (Radiation Oncology) [name], Netty Starring., MD as Consulting Physician (Gastroenterology) Josephine Igo, DO as Consulting Physician (Pulmonary Disease) Karie Soda, MD as Consulting Physician (General Surgery)  Date of Service:  08/13/2023  CHIEF COMPLAINT: f/u of rectal cancer  CURRENT THERAPY:  CapeOx and bevacizumab  Oncology History   Rectal adenocarcinoma (HCC) cT3cN1M0 stage IIIb, biopsy confirmed residual primary tumor and oligo lung met in 09/2021 -Initially diagnosed 11/2020 by colonoscopy for progressive rectal pain and bleeding, weight loss, and constipation. -Despite strong recommendation, patient firmly and repeatedly declined IV chemo and surgery. She agreed to chemoRT with Xeloda, and received the treatment 01/04/21 - 02/14/21. -she developed local cancer progression and lung mets in 11/2021 -she started Xeloda on 02/16/2022, low dose oxaliplatin was added on 05/11/22 (with cycle 4 of Xeloda)  -chemo held since 07/2022 due to leg pain and recurrent nausea  -restaging CT 09/07/2022 showed stable disease in rectum and lung, no new lesions.   -she had chemo break from Feb-May 2024 -restart CAPOX with dose reduction 12/18/2022, not tolerating very well overall.  Oxaliplatin was held on August 5.  -Restaging CT scan from 07/16/2023 showed stable rectal wall thickening, no other  evidence of metastasis      Assessment and Plan    Metastatic Rectal Cancer Follow-up for metastatic rectal cancer. Reports overall improvement with no significant pain, managed with tramadol PRN. No significant  nausea, managed with Zofran PRN. Not taking Compazine. Scheduled for bevacizumab infusion today, no chemotherapy. Decreased appetite for 2-3 days post-infusion, managed with dexamethasone. Last scan in December showed stable disease. Currently on oral chemotherapy (two tablets BID for two weeks, then off for a week). Discussed non-curability but option to pause treatment. Next infusion on February 18 at 9:15 AM and following infusion on March 10 at 9:45 AM. Scan scheduled in three weeks. - Administer bevacizumab infusion today - Continue tramadol PRN for pain - Continue Zofran PRN for nausea - Administer dexamethasone post-chemotherapy for appetite management - Continue oral chemotherapy Xeloda(two tablets BID for two weeks, then off for a week) - Next infusion on February 18 at 9:15 AM   Plan -Lab reviewed, adequate for treatment, will proceed bevacizumab today, she started Xeloda this morning -Lab, follow-up and oxaliplatin/bevacizumab infusion in 3 weeks    SUMMARY OF ONCOLOGIC HISTORY: Oncology History Overview Note  Cancer Staging Rectal adenocarcinoma (HCC) Staging form: Colon and Rectum, AJCC 8th Edition - Clinical stage from 12/21/2020: Stage IIIB (cT3, cN1, cM0) - Unsigned    Rectal adenocarcinoma (HCC)  08/26/2020 Miscellaneous   Initial presentation to PCP, reporting intermittent BRBPR since 02/2020    12/01/2020 Procedure   Colonoscopy by Dr. Lavon Paganini findings - The perianal and digital rectal examinations were normal. - A 15 mm polyp was found in the ascending colon. The polyp was semi-pedunculated. Resection and retrieval were complete. - A 25 mm polyp was found in the sigmoid colon. The polyp was pedunculated. Resection and retrieval were complete. - An infiltrative partially obstructing large mass was found in the proximal rectum. The mass was partially circumferential (involving one-half of the lumen circumference). The mass measured eight cm in length extending from 10  to  18cm from anal verge. This was biopsied with a cold forceps for histology. Proximal and distal opposite fold area of the mass lesion was tattooed with an injection of total 3 mL of Spot (carbon black).   12/01/2020 Initial Biopsy   Diagnosis 1. Colon, polyp(s), ascending x 1 - ADENOCARCINOMA ARISING IN A TUBULAR ADENOMA WITH HIGH-GRADE DYSPLASIA. SEE NOTE 2. Colon, sigmoid polyp, x1 - TUBULOVILLOUS ADENOMA(S) - NEGATIVE FOR HIGH-GRADE DYSPLASIA OR MALIGNANCY 3. Rectum, biopsy - ADENOCARCINOMA. SEE NOTE   12/01/2020 Cancer Staging   Cancer Staging Rectal adenocarcinoma [facility]) Staging form: Colon and Rectum, AJCC 8th Edition - Clinical stage from 12/21/2020: Stage IIIB (cT3, cN1, cM0) - Unsigned    12/06/2020 Imaging   CT CAP with contrast IMPRESSION: 1. There is partially circumferential soft tissue thickening of the mid to superior rectum, approximately 5 cm in length and the inferior extent approximately 6 cm above the anal verge, consistent with primary rectal malignancy identified by colonoscopy. 2. There appear to be abnormally enlarged perirectal lymph nodes posteriorly about the superior rectum measuring up to 1.0 x 0.8 cm. Findings are suspicious for perirectal nodal metastatic disease, however rectal MRI is the test of choice for initial local staging of rectal cancer. 3. There is a 4 mm nonspecific pulmonary nodule of the superior segment left lower lobe, statistically most likely incidental, infectious or inflammatory, although nonspecific and isolated metastatic disease is not strictly excluded. Attention on follow-up. 4. No other evidence of metastatic disease in the chest, abdomen, or pelvis. Aortic Atherosclerosis (ICD10-I70.0).   12/09/2020 Imaging   Local staging MRI pelvis without contrast IMPRESSION: 4.9 cm circumferential mid/lower rectal mass, corresponding to the patient's newly diagnosed rectal cancer. Rectal adenocarcinoma T stage: T3c Rectal adenocarcinoma  N stage:  N1 Distance from tumor to the internal anal sphincter is 3.7 cm.   12/21/2020 Initial Diagnosis   Rectal adenocarcinoma (HCC)   01/04/2021 -  Chemotherapy   Concurrent chemoradiation with Xeloda 1000mg  in the AM and 1500mg  in the PM on days of Radiation starting 01/04/21.       01/04/2021 - 02/11/2021 Radiation Therapy   Concurrent chemoradiation with Dr Mitzi Hansen and Xeloda starting 01/04/21.    01/31/2022 Procedure   Colonoscopy, Dr. Lavon Paganini  Findings: - An infiltrative partially obstructing large mass was found in the rectum. The mass was circumferential. The mass measured ten cm in length, extending from 5-15cm from anal verge. In addition, rectal diameter at narrowest approx twelve mm. Oozing was present. Biopsies were taken with a cold forceps for histology.  Impression: - Hemorrhoids found on perianal exam. - Likely malignant partially obstructing tumor in the rectum. Biopsied. - Non-bleeding external and internal hemorrhoids.   01/31/2022 Pathology Results   Diagnosis Rectum, biopsy INVASIVE MODERATELY DIFFERENTIATED ADENOCARCINOMA   05/11/2022 -  Chemotherapy   Patient is on Treatment Plan : COLORECTAL Xelox (Capeox)(130/850) q21d     09/07/2022 Imaging    IMPRESSION: 1. No significant change in circumferential wall thickening of the rectum, in keeping with known primary rectal mass. Similar appearance of post treatment perirectal and presacral fat stranding and fascial thickening 2. Unchanged nodule of the superior segment left lower lobe with adjacent bandlike scarring. No new nodules. 3. No evidence of lymphadenopathy or other metastatic disease in the chest, abdomen, or pelvis. 4. Large burden of stool throughout the colon. 5. Cardiomegaly.   12/08/2022 Imaging    IMPRESSION: Persistent wall thickening along the rectum with adjacent severe inflammatory stranding and nodularity. Please correlate with exact  location of the distribution of the patient's  neoplasm.   No discrete new mass lesion, fluid collection or lymph node enlargement.   Improved visualization of the fractures involving the left side of the pubic symphysis as well as probable insufficiency fractures along the sacrum. Associated areas of increasing heterogeneous bony sclerosis along the pubic bones and sacrum. In principle sclerotic bone metastases would be in the differential but are felt to be less likely based on the overall appearance. If needed confirmatory MRI can be considered as clinically appropriate to further delineate.   Stable scarring and fibrotic changes along the lungs with a tiny nodular area along the superior segment of the left lower lobe. Simple continued follow up.   Enlarged heart.      Discussed the use of AI scribe software for clinical note transcription with the patient, who gave verbal consent to proceed.  History of Present Illness   A 73 year old patient with a known history of metastatic rectal cancer presents for a follow-up visit. She reports an overall improvement in her condition. However, she experiences intermittent burning pain in the large intestine, which she describes as a burning sensation that sometimes occurs during bowel movements. The pain is managed with tramadol, which she takes as needed. She also experiences occasional nausea, which is managed with Zofran and Compazine, taken as needed. The patient is currently on a chemotherapy regimen, which includes oral chemotherapy pills taken for two weeks, followed by a week off, and infusions of bevacizumab and oxaliplatin. The patient's last scan, done at the end of December, showed stable disease.         All other systems were reviewed with the patient and are negative.  MEDICAL HISTORY:  Past Medical History:  Diagnosis Date   Bilateral wrist pain 10/25/2017   Hypertension    Left leg DVT (deep venous thrombosis) (HCC) 06/2022   Neck mass 1998   Unknown biopsy  results. Excised in [place].   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   Rectal cancer (HCC)    Trigger finger, acquired 02/08/2017    SURGICAL HISTORY: Past Surgical History:  Procedure Laterality Date   BRONCHIAL BIOPSY  12/13/2021   Procedure: BRONCHIAL BIOPSIES;  Surgeon: Josephine Igo, DO;  Location: [facility];  Service: Pulmonary;;   BRONCHIAL NEEDLE ASPIRATION BIOPSY  12/13/2021   Procedure: BRONCHIAL NEEDLE ASPIRATION BIOPSIES;  Surgeon: Josephine Igo, DO;  Location: [facility];  Service: Pulmonary;;   NECK SURGERY Left 1998   Excision of mass in [place]   VIDEO BRONCHOSCOPY WITH RADIAL ENDOBRONCHIAL ULTRASOUND  12/13/2021   Procedure: RADIAL ENDOBRONCHIAL ULTRASOUND;  Surgeon: Josephine Igo, DO;  Location: [facility];  Service: Pulmonary;;    I have reviewed the social history and family history with the patient and they are unchanged from previous note.  ALLERGIES:  is allergic to no known allergies.  MEDICATIONS:  Current Outpatient Medications  Medication Sig Dispense Refill   amLODipine (NORVASC) 5 MG tablet Take 1 tablet (5 mg total) by mouth daily. 90 tablet 3   brimonidine (ALPHAGAN) 0.2 % ophthalmic solution Place 1 drop into the right eye 2 (two) times daily. 15 mL 3   brimonidine (ALPHAGAN) 0.2 % ophthalmic solution Place 1 drop into the right eye 2 (two) times daily. 10 mL 11   capecitabine (XELODA) 500 MG tablet Take 2 tabs every 12 hours, for 14 days then off for 7 days. Take after meal 56 tablet 2  dexamethasone (DECADRON) 4 MG tablet Take 1 tablet (4 mg total) by mouth daily. TAKE DAILY FOR 3-5 DAYS AFTER IV CHEMO 20 tablet 1   dorzolamide-timolol (COSOPT) 2-0.5 % ophthalmic solution Place 1 drop into both eyes 2 times daily. 30 mL 3   dorzolamide-timolol (COSOPT) 2-0.5 % ophthalmic solution Instill 1 drop into both eyes twice a day 10 mL 11   gabapentin (NEURONTIN) 100 MG capsule Take 1 capsule (100 mg  total) by mouth 3 (three) times daily as needed. 90 capsule 2   latanoprost (XALATAN) 0.005 % ophthalmic solution Instill 1 drop into both eyes every night at bedtime 7.5 mL 3   latanoprost (XALATAN) 0.005 % ophthalmic solution Place 1 drop into both eyes at bedtime. 7.5 mL 6   lidocaine (XYLOCAINE) 5 % ointment Apply to anal area daily as needed. 50 g 1   lisinopril-hydrochlorothiazide (ZESTORETIC) 20-12.5 MG tablet Take 2 tablets by mouth daily. 60 tablet 3   mirtazapine (REMERON) 30 MG tablet Take 1 tablet (30 mg total) by mouth at bedtime. 30 tablet 1   Multiple Vitamins-Minerals (MULTIVITAMIN WITH MINERALS) tablet Take 1 tablet by mouth daily. Gummy 50 mg     ondansetron (ZOFRAN) 8 MG tablet Take 1 tablet (8 mg total) by mouth every 8 (eight) hours as needed for nausea or vomiting. Take after 3 days of chemo 30 tablet 2   polyethylene glycol powder (GLYCOLAX/MIRALAX) 17 GM/SCOOP powder Take 1 Container by mouth once.     prochlorperazine (COMPAZINE) 10 MG tablet Take 1 tablet (10 mg total) by mouth every 6 (six) hours as needed. 30 tablet 2   rivaroxaban (XARELTO) 20 MG TABS tablet Take 1 tablet (20 mg total) by mouth daily with supper. 30 tablet 2   traMADol (ULTRAM) 50 MG tablet Take 1 tablet (50 mg total) by mouth every 6 (six) hours as needed. 90 tablet 0   urea (CARMOL) 10 % cream Apply topically 2 times daily as needed. 85 g 3   No current facility-administered medications for this visit.    PHYSICAL EXAMINATION: ECOG PERFORMANCE STATUS: 1 - Symptomatic but completely ambulatory  Vitals:   08/13/23 1144  BP: 108/76  Pulse: 77  Resp: 18  Temp: 98.2 F (36.8 C)  SpO2: 100%   Wt Readings from Last 3 Encounters:  08/13/23 100 lb 4.8 oz (45.5 kg)  07/23/23 96 lb 14.4 oz (44 kg)  06/05/23 96 lb 6.4 oz (43.7 kg)     GENERAL:alert, no distress and comfortable SKIN: skin color, texture, turgor are normal, no rashes or significant lesions EYES: normal, Conjunctiva are pink and  non-injected, sclera clear NECK: supple, thyroid normal size, non-tender, without nodularity LYMPH:  no palpable lymphadenopathy in the cervical, axillary  LUNGS: clear to auscultation and percussion with normal breathing effort HEART: regular rate & rhythm and no murmurs and no lower extremity edema ABDOMEN:abdomen soft, non-tender and normal bowel sounds Musculoskeletal:no cyanosis of digits and no clubbing  NEURO: alert & oriented x 3 with fluent speech, no focal motor/sensory deficits    LABORATORY DATA:  I have reviewed the data as listed    Latest Ref Rng & Units 08/13/2023   11:19 AM 07/23/2023   10:52 AM 06/05/2023   10:33 AM  CBC  WBC 4.0 - 10.5 K/uL 8.1  5.5  4.3   Hemoglobin 12.0 - 15.0 g/dL 16.1  09.6  04.5   Hematocrit 36.0 - 46.0 % 31.2  31.0  30.7   Platelets 150 - 400 K/uL 294  305  250         Latest Ref Rng & Units 08/13/2023   11:19 AM 07/23/2023   10:52 AM 06/05/2023   10:33 AM  CMP  Glucose 70 - 99 mg/dL 161  096  78   BUN 8 - 23 mg/dL 17  16  14    Creatinine 0.44 - 1.00 mg/dL 0.45  4.09  8.11   Sodium 135 - 145 mmol/L 136  136  139   Potassium 3.5 - 5.1 mmol/L 3.7  3.5  3.7   Chloride 98 - 111 mmol/L 103  102  107   CO2 22 - 32 mmol/L 28  28  27    Calcium 8.9 - 10.3 mg/dL 8.3  8.6  8.9   Total Protein 6.5 - 8.1 g/dL 6.6  7.1  7.3   Total Bilirubin 0.0 - 1.2 mg/dL 0.3  0.3  0.3   Alkaline Phos 38 - 126 U/L 56  62  67   AST 15 - 41 U/L 13  14  17    ALT 0 - 44 U/L 6  <5  <5       RADIOGRAPHIC STUDIES: I have personally reviewed the radiological images as listed and agreed with the findings in the report. No results found.    Orders Placed This Encounter  Procedures   CBC with Differential (Cancer Center Only)    Standing Status:   Future    Expected Date:   10/16/2023    Expiration Date:   10/15/2024   CMP (Cancer Center only)    Standing Status:   Future    Expected Date:   10/16/2023    Expiration Date:   10/15/2024   CBC with Differential  (Cancer Center Only)    Standing Status:   Future    Expected Date:   11/06/2023    Expiration Date:   11/05/2024   CBC with Differential (Cancer Center Only)    Standing Status:   Future    Expected Date:   11/27/2023    Expiration Date:   11/26/2024   CMP (Cancer Center only)    Standing Status:   Future    Expected Date:   11/27/2023    Expiration Date:   11/26/2024   CBC with Differential (Cancer Center Only)    Standing Status:   Future    Expected Date:   12/18/2023    Expiration Date:   12/17/2024   All questions were answered. The patient knows to call the clinic with any problems, questions or concerns. No barriers to learning was detected. The total time spent in the appointment was 30 minutes.     Malachy Mood, MD 08/13/2023

## 2023-08-13 NOTE — Congregational Nurse Program (Signed)
Accompanied patient to appointment at [facility].  Patient stated she is doing well and only lost appetite for 2 to 3 days following last chemo. She started Xeloda this morning and will continues for 2 weeks then 1 week off. She received Vegzelma infusion today. She has gained weight and was seen by nutritionist who gave her some Ensure samples. Her next appointment is 09/04/2023 @ 9:15 am.  CN requested transportation for that appointment and for today's ride home.  [name] RN, Congregational Nurse [phone]

## 2023-08-13 NOTE — Progress Notes (Signed)
Nutrition follow-up completed with patient using interpreter.  Patient is receiving bevacizumab, Xeloda, and oxaliplatin for metastatic rectal cancer.  Her disease is stable.  She is a patient of Dr. Mosetta Putt.  Weight improved and documented as 100 pounds 4.8 ounces on January 27.  This is increased from 95 pounds 12 ounces October 29.  Noted glucose 131.  Nutrition diagnosis: Food and nutrition related knowledge deficit, resolved.  Encouraged patient to continue consuming small frequent meals and snacks and Ensure Plus or equivalent 3-4 cartons as tolerated.  I provided additional samples and coupons.  Patient has no questions or concerns.  I encouraged her to reach out as needed.

## 2023-08-16 ENCOUNTER — Telehealth: Payer: Self-pay

## 2023-08-16 NOTE — Telephone Encounter (Signed)
Scheduled transportation for [phone] appointment at [facility].  [name] RN, Congregational Nurse [phone]

## 2023-08-23 ENCOUNTER — Other Ambulatory Visit (HOSPITAL_COMMUNITY): Payer: Self-pay

## 2023-08-23 ENCOUNTER — Other Ambulatory Visit (HOSPITAL_COMMUNITY): Payer: Self-pay | Admitting: Pharmacy Technician

## 2023-08-23 NOTE — Progress Notes (Signed)
 Specialty Pharmacy Refill Coordination Note  Tarae Tool is a 73 y.o. female contacted today regarding refills of specialty medication(s) Capecitabine  (XELODA )  Spoke with  [name], RN (patient's congregational nurse)  Patient requested [name] at [facility] at [place] date: 08/31/23   Medication will be filled on 08/30/23.

## 2023-08-24 ENCOUNTER — Telehealth: Payer: Self-pay | Admitting: Hematology

## 2023-08-25 ENCOUNTER — Other Ambulatory Visit: Payer: Self-pay

## 2023-08-27 ENCOUNTER — Ambulatory Visit (INDEPENDENT_AMBULATORY_CARE_PROVIDER_SITE_OTHER): Payer: Medicaid Other | Admitting: Student

## 2023-08-27 VITALS — BP 113/74 | HR 64 | Temp 98.1°F | Wt 98.0 lb

## 2023-08-27 DIAGNOSIS — C20 Malignant neoplasm of rectum: Secondary | ICD-10-CM

## 2023-08-27 DIAGNOSIS — R7303 Prediabetes: Secondary | ICD-10-CM

## 2023-08-27 DIAGNOSIS — I10 Essential (primary) hypertension: Secondary | ICD-10-CM | POA: Diagnosis not present

## 2023-08-27 LAB — POCT GLYCOSYLATED HEMOGLOBIN (HGB A1C): Hemoglobin A1C: 6 % — AB (ref 4.0–5.6)

## 2023-08-27 LAB — GLUCOSE, CAPILLARY: Glucose-Capillary: 85 mg/dL (ref 70–99)

## 2023-08-27 NOTE — Progress Notes (Signed)
CC: Follow-up  HPI:  [name] is a 73 y.o. female living with a history stated below and presents today for follow-up. Please see problem based assessment and plan for additional details.  Past Medical History:  Diagnosis Date   Bilateral wrist pain 10/25/2017   Hypertension    Left leg DVT (deep venous thrombosis) (HCC) 06/2022   Neck mass 1998   Unknown biopsy results. Excised in [place].   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 90 tablet 3   brimonidine (ALPHAGAN) 0.2 % ophthalmic solution Place 1 drop into the right eye 2 (two) times daily. 15 mL 3   brimonidine (ALPHAGAN) 0.2 % ophthalmic solution Place 1 drop into the right eye 2 (two) times daily. 10 mL 11   capecitabine (XELODA) 500 MG tablet Take 2 tabs every 12 hours, for 14 days then off for 7 days. Take after meal 56 tablet 2   dexamethasone (DECADRON) 4 MG tablet Take 1 tablet (4 mg total) by mouth daily. TAKE DAILY FOR 3-5 DAYS AFTER IV CHEMO 20 tablet 1   dorzolamide-timolol (COSOPT) 2-0.5 % ophthalmic solution Place 1 drop into both eyes 2 times daily. 30 mL 3   dorzolamide-timolol (COSOPT) 2-0.5 % ophthalmic solution Instill 1 drop into both eyes twice a day 10 mL 11   gabapentin (NEURONTIN) 100 MG capsule Take 1 capsule (100 mg total) by mouth 3 (three) times daily as needed. 90 capsule 2   latanoprost (XALATAN) 0.005 % ophthalmic solution Instill 1 drop into both eyes every night at bedtime 7.5 mL 3   latanoprost (XALATAN) 0.005 % ophthalmic solution Place 1 drop into both eyes at bedtime. 7.5 mL 6   lidocaine (XYLOCAINE) 5 % ointment Apply to anal area daily as needed. 50 g 1   lisinopril-hydrochlorothiazide (ZESTORETIC) 20-12.5 MG tablet Take  2 tablets by mouth daily. 60 tablet 3   mirtazapine (REMERON) 30 MG tablet Take 1 tablet (30 mg total) by mouth at bedtime. 30 tablet 1   Multiple Vitamins-Minerals (MULTIVITAMIN WITH MINERALS) tablet Take 1 tablet by mouth daily. Gummy 50 mg     ondansetron (ZOFRAN) 8 MG tablet Take 1 tablet (8 mg total) by mouth every 8 (eight) hours as needed for nausea or vomiting. Take after 3 days of chemo 30 tablet 2   polyethylene glycol powder (GLYCOLAX/MIRALAX) 17 GM/SCOOP powder Take 1 Container by mouth once.     prochlorperazine (COMPAZINE) 10 MG tablet Take 1 tablet (10 mg total) by mouth every 6 (six) hours as needed. 30 tablet 2   rivaroxaban (XARELTO) 20 MG TABS tablet Take 1 tablet (20 mg total) by mouth daily with supper. 30 tablet 2   traMADol (ULTRAM) 50 MG tablet Take 1 tablet (50 mg total) by mouth every 6 (six) hours as needed. 90 tablet 0   urea (CARMOL) 10 % cream Apply topically 2 times daily as needed. 85 g 3   No current facility-administered medications on file prior to visit.    Family History  Problem Relation Age of Onset   Headache Son    Colon cancer Neg Hx    Stomach cancer Neg Hx    Esophageal cancer Neg Hx     Social  History   Socioeconomic History   Marital status: Married    Spouse name: Jillian Lutz, Jillian Lutz   Number of children: Not on file   Years of education: Not on file   Highest education level: Not on file  Occupational History   Not on file  Tobacco Use   Smoking status: Never   Smokeless tobacco: Never  Vaping Use   Vaping status: Never Used  Substance and Sexual Activity   Alcohol use: No   Drug use: No   Sexual activity: Not on file  Other Topics Concern   Not on file  Social History Narrative   Came to [place] from [place] in 2013.   Does not know much family history [place].   Social Drivers of Corporate investment banker Strain: Not on file  Food Insecurity: No Food Insecurity  (05/15/2023)   Hunger Vital Sign    Worried About Running Out of Food in the Last Year: Never true    Ran Out of Food in the Last Year: Never true  Transportation Needs: No Transportation Needs (05/10/2022)   PRAPARE - Administrator, Civil Service (Medical): No    Lack of Transportation (Non-Medical): No  Physical Activity: Not on file  Stress: Not on file  Social Connections: Not on file  Intimate Partner Violence: Not At Risk (12/22/2020)   Humiliation, Afraid, Rape, and Kick questionnaire    Fear of Current or Ex-Partner: No    Emotionally Abused: No    Physically Abused: No    Sexually Abused: No    Review of Systems: ROS negative except for what is noted on the assessment and plan.  Vitals:   08/27/23 1537  BP: 113/74  Pulse: 64  Temp: 98.1 F (36.7 C)  TempSrc: Oral  SpO2: 99%  Weight: 98 lb (44.5 kg)    Physical Exam: Constitutional: well-appearing, sitting in chair, in no acute distress Cardiovascular: regular rate and rhythm, no m/r/g Pulmonary/Chest: normal work of breathing on room air, lungs clear to auscultation bilaterally Abdominal: soft, non-tender, non-distended MSK: normal bulk and tone Skin: warm and dry Psych: normal mood and behavior  Assessment & Plan:     Patient discussed with Dr. Antony Contras  Essential hypertension BP 113/74. Prescribed Norvasc 5 and Zestoretic 20-12.5 BID but patient has only been taking Zestoretic once daily since it was prescribed. -Continue Norvasc and Zestoretic once daily -Patient instructed to monitor BP at home and resume BID if needed  Pre-diabetes A1c is 6.0. Patient has rectal cancer (follows closely with Oncology) and is taking dexamethasone for this. Patient will focus on diet modifications.  Rectal adenocarcinoma (HCC) With lung metastasis though stable per Oncology notes. Last seen 07/2023 with Dr. Mosetta Putt.   Carmina Miller, D.O. [facility] Internal Medicine, PGY-1 Phone: [phone] Date  08/28/2023 Time 10:36 AM

## 2023-08-27 NOTE — Congregational Nurse Program (Signed)
 Accompanied patient to PCP appointment at [facility].  Patient expressed no complaints bur stated she still has some pain and tingling in feet and legs.  Only takes gabapentin  B.I.D.  Dr. Herminio Lopes recommended she take T.I.D to see if pain improves.  Stated she has only been taking Lisinopril -hydrochlorothiazide  once a day instead of B.I.D. as ordered. Dr. Herminio Lopes advised her to continue once as day and to monitor her BP.  If systolic goes above 130 she should resume taking B.I.D. Return in 6 months.  Raeford Bullion RN, Congregational Nurse [phone]

## 2023-08-28 ENCOUNTER — Other Ambulatory Visit (HOSPITAL_COMMUNITY): Payer: Self-pay

## 2023-08-28 NOTE — Assessment & Plan Note (Signed)
A1c is 6.0. Patient has rectal cancer (follows closely with Oncology) and is taking dexamethasone for this. Patient will focus on diet modifications.

## 2023-08-28 NOTE — Assessment & Plan Note (Addendum)
BP 113/74. Prescribed Norvasc 5 and Zestoretic 20-12.5 BID but patient has only been taking Zestoretic once daily since it was prescribed. -Continue Norvasc and Zestoretic once daily -Patient instructed to monitor BP at home and resume BID if needed

## 2023-08-28 NOTE — Congregational Nurse Program (Signed)
Home visit to deliver prescription for mirtazapine 30 mg which was picked up from [facility]. She understands directions and will take 1 tablet at bedtime.  Also called caseworker [place] at [facility] regarding letter she received from [place] rental office regarding overdue payment.  [name] RN, Congregational Nurse [phone]

## 2023-08-28 NOTE — Progress Notes (Signed)
Internal Medicine Clinic Attending  Case discussed with the resident at the time of the visit.  We reviewed the resident's history and exam and pertinent patient test results.  I agree with the assessment, diagnosis, and plan of care documented in the resident's note.

## 2023-08-28 NOTE — Assessment & Plan Note (Signed)
With lung metastasis though stable per Oncology notes. Last seen 07/2023 with Dr. Mosetta Putt.

## 2023-08-29 ENCOUNTER — Other Ambulatory Visit: Payer: Self-pay

## 2023-08-30 ENCOUNTER — Other Ambulatory Visit (HOSPITAL_COMMUNITY): Payer: Self-pay

## 2023-08-30 ENCOUNTER — Other Ambulatory Visit: Payer: Self-pay

## 2023-09-01 ENCOUNTER — Other Ambulatory Visit: Payer: Self-pay

## 2023-09-03 ENCOUNTER — Other Ambulatory Visit: Payer: Self-pay

## 2023-09-03 NOTE — Assessment & Plan Note (Signed)
 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 -Despite strong recommendation, patient firmly and repeatedly declined IV chemo and surgery. She agreed to chemoRT with Xeloda, and received the treatment 01/04/21 - 02/14/21. -she developed local cancer progression and lung mets in 11/2021 -she started Xeloda on 02/16/2022, low dose oxaliplatin was added on 05/11/22 (with cycle 4 of Xeloda)  -chemo held since 07/2022 due to leg pain and recurrent nausea  -restaging CT 09/07/2022 showed stable disease in rectum and lung, no new lesions.   -she had chemo break from Feb-May 2024 -restart CAPOX with dose reduction 12/18/2022, not tolerating very well overall.  Oxaliplatin was held on August 5.  -Restaging CT scan from 07/16/2023 showed stable rectal wall thickening, no other  evidence of metastasis

## 2023-09-03 NOTE — Assessment & Plan Note (Signed)
-  Doppler on July 13, 2022 showed left acute DVT involving popliteal and peroneal vein, CTA chest was negative for PE. -continue Xarelto, tolerating well

## 2023-09-03 NOTE — Congregational Nurse Program (Signed)
Home visit to deliver Xeloda which was picked up from [facility]  Patient will start taking it tomorrow prior to appointment at [facility].  [name] RN, Congregational Nurse [phone]

## 2023-09-04 ENCOUNTER — Inpatient Hospital Stay (HOSPITAL_BASED_OUTPATIENT_CLINIC_OR_DEPARTMENT_OTHER): Payer: Medicaid Other | Admitting: Hematology

## 2023-09-04 ENCOUNTER — Other Ambulatory Visit: Payer: Self-pay

## 2023-09-04 ENCOUNTER — Other Ambulatory Visit (HOSPITAL_COMMUNITY): Payer: Self-pay

## 2023-09-04 ENCOUNTER — Inpatient Hospital Stay: Payer: Medicaid Other | Attending: Physician Assistant

## 2023-09-04 ENCOUNTER — Inpatient Hospital Stay: Payer: Medicaid Other

## 2023-09-04 ENCOUNTER — Encounter: Payer: Self-pay | Admitting: Hematology

## 2023-09-04 VITALS — BP 116/83 | HR 75 | Temp 97.2°F | Resp 16 | Wt 100.0 lb

## 2023-09-04 DIAGNOSIS — K6289 Other specified diseases of anus and rectum: Secondary | ICD-10-CM

## 2023-09-04 DIAGNOSIS — R630 Anorexia: Secondary | ICD-10-CM

## 2023-09-04 DIAGNOSIS — Z7901 Long term (current) use of anticoagulants: Secondary | ICD-10-CM | POA: Insufficient documentation

## 2023-09-04 DIAGNOSIS — I82432 Acute embolism and thrombosis of left popliteal vein: Secondary | ICD-10-CM

## 2023-09-04 DIAGNOSIS — C20 Malignant neoplasm of rectum: Secondary | ICD-10-CM

## 2023-09-04 DIAGNOSIS — C7800 Secondary malignant neoplasm of unspecified lung: Secondary | ICD-10-CM | POA: Insufficient documentation

## 2023-09-04 DIAGNOSIS — Z5111 Encounter for antineoplastic chemotherapy: Secondary | ICD-10-CM | POA: Diagnosis present

## 2023-09-04 DIAGNOSIS — D509 Iron deficiency anemia, unspecified: Secondary | ICD-10-CM

## 2023-09-04 DIAGNOSIS — Z95828 Presence of other vascular implants and grafts: Secondary | ICD-10-CM

## 2023-09-04 DIAGNOSIS — Z86718 Personal history of other venous thrombosis and embolism: Secondary | ICD-10-CM | POA: Diagnosis not present

## 2023-09-04 LAB — CMP (CANCER CENTER ONLY)
ALT: <5 U/L (ref 0–44)
AST: 13 U/L — ABNORMAL LOW (ref 15–41)
Albumin: 3.5 g/dL (ref 3.5–5.0)
Alkaline Phosphatase: 65 U/L (ref 38–126)
Anion gap: 5 (ref 5–15)
BUN: 16 mg/dL (ref 8–23)
CO2: 29 mmol/L (ref 22–32)
Calcium: 8.6 mg/dL — ABNORMAL LOW (ref 8.9–10.3)
Chloride: 105 mmol/L (ref 98–111)
Creatinine: 0.81 mg/dL (ref 0.44–1.00)
GFR, Estimated: >60 mL/min (ref >60)
Glucose, Bld: 126 mg/dL — ABNORMAL HIGH (ref 70–99)
Potassium: 3.5 mmol/L (ref 3.5–5.1)
Sodium: 139 mmol/L (ref 135–145)
Total Bilirubin: 0.3 mg/dL (ref 0.0–1.2)
Total Protein: 6.7 g/dL (ref 6.5–8.1)

## 2023-09-04 LAB — CBC WITH DIFFERENTIAL (CANCER CENTER ONLY)
Abs Immature Granulocytes: 0.01 10*3/uL (ref 0.00–0.07)
Basophils Absolute: 0.1 10*3/uL (ref 0.0–0.1)
Basophils Relative: 1 %
Eosinophils Absolute: 0.2 10*3/uL (ref 0.0–0.5)
Eosinophils Relative: 3 %
HCT: 31.6 % — ABNORMAL LOW (ref 36.0–46.0)
Hemoglobin: 10.1 g/dL — ABNORMAL LOW (ref 12.0–15.0)
Immature Granulocytes: 0 %
Lymphocytes Relative: 18 %
Lymphs Abs: 0.9 10*3/uL (ref 0.7–4.0)
MCH: 24.3 pg — ABNORMAL LOW (ref 26.0–34.0)
MCHC: 32 g/dL (ref 30.0–36.0)
MCV: 76.1 fL — ABNORMAL LOW (ref 80.0–100.0)
Monocytes Absolute: 0.6 10*3/uL (ref 0.1–1.0)
Monocytes Relative: 11 %
Neutro Abs: 3.3 10*3/uL (ref 1.7–7.7)
Neutrophils Relative %: 67 %
Platelet Count: 239 10*3/uL (ref 150–400)
RBC: 4.15 MIL/uL (ref 3.87–5.11)
RDW: 18.2 % — ABNORMAL HIGH (ref 11.5–15.5)
WBC Count: 5 10*3/uL (ref 4.0–10.5)
nRBC: 0 % (ref 0.0–0.2)

## 2023-09-04 LAB — FERRITIN: Ferritin: 13 ng/mL (ref 11–307)

## 2023-09-04 LAB — CEA (ACCESS): CEA (CHCC): 15.9 ng/mL — ABNORMAL HIGH (ref 0.00–5.00)

## 2023-09-04 MED ORDER — PALONOSETRON HCL INJECTION 0.25 MG/5ML
0.2500 mg | Freq: Once | INTRAVENOUS | Status: AC
  Administered 2023-09-04: 0.25 mg via INTRAVENOUS
  Filled 2023-09-04: qty 5

## 2023-09-04 MED ORDER — MIRTAZAPINE 30 MG PO TABS
30.0000 mg | ORAL_TABLET | Freq: Every day | ORAL | 3 refills | Status: DC
  Filled 2023-09-04 – 2023-09-24 (×2): qty 30, 30d supply, fill #0
  Filled 2023-10-22: qty 30, 30d supply, fill #1

## 2023-09-04 MED ORDER — SODIUM CHLORIDE 0.9% FLUSH
10.0000 mL | Freq: Once | INTRAVENOUS | Status: AC
Start: 2023-09-04 — End: 2023-09-04
  Administered 2023-09-04: 10 mL

## 2023-09-04 MED ORDER — DEXTROSE 5 % IV SOLN: Freq: Once | INTRAVENOUS | Status: AC

## 2023-09-04 MED ORDER — DEXTROSE 5 % IV SOLN
70.0000 mg/m2 | Freq: Once | INTRAVENOUS | Status: AC
  Administered 2023-09-04: 100 mg via INTRAVENOUS
  Filled 2023-09-04: qty 20

## 2023-09-04 MED ORDER — TRAMADOL HCL 50 MG PO TABS
50.0000 mg | ORAL_TABLET | Freq: Four times a day (QID) | ORAL | 0 refills | Status: DC | PRN
  Filled 2023-09-04: qty 90, 23d supply, fill #0

## 2023-09-04 MED ORDER — DEXAMETHASONE SODIUM PHOSPHATE 10 MG/ML IJ SOLN
10.0000 mg | Freq: Once | INTRAMUSCULAR | Status: AC
  Administered 2023-09-04: 10 mg via INTRAVENOUS
  Filled 2023-09-04: qty 1

## 2023-09-04 MED ORDER — SODIUM CHLORIDE 0.9 % IV SOLN: Freq: Once | INTRAVENOUS | Status: AC

## 2023-09-04 MED ORDER — FAMOTIDINE IN NACL 20-0.9 MG/50ML-% IV SOLN
20.0000 mg | Freq: Once | INTRAVENOUS | Status: AC
  Administered 2023-09-04: 20 mg via INTRAVENOUS
  Filled 2023-09-04: qty 50

## 2023-09-04 MED ORDER — DIPHENHYDRAMINE HCL 50 MG/ML IJ SOLN
25.0000 mg | Freq: Once | INTRAMUSCULAR | Status: AC
  Administered 2023-09-04: 25 mg via INTRAVENOUS
  Filled 2023-09-04: qty 1

## 2023-09-04 MED ORDER — SODIUM CHLORIDE 0.9 % IV SOLN
7.5000 mg/kg | Freq: Once | INTRAVENOUS | Status: AC
  Administered 2023-09-04: 350 mg via INTRAVENOUS
  Filled 2023-09-04: qty 14

## 2023-09-04 MED ORDER — DEXAMETHASONE 4 MG PO TABS
4.0000 mg | ORAL_TABLET | Freq: Every day | ORAL | 1 refills | Status: AC
  Filled 2023-09-04: qty 20, 20d supply, fill #0
  Filled 2024-03-01: qty 20, 20d supply, fill #1

## 2023-09-04 NOTE — Congregational Nurse Program (Signed)
Accompanied patient to appointment at [facility] for labs, visit with Dr. Mosetta Putt and chemo.  Called transportation to take her home.  Scheduled C-T scan appointment for 10/09/2023 at 10:45 am. at [facility].  [name] RN, Congregational Nurse [phone]

## 2023-09-04 NOTE — Patient Instructions (Signed)
 [facility] - A DEPT OF [facility]  Discharge Instructions: Thank you for choosing [facility] to provide your oncology and hematology care.   If you have a lab appointment with the Cancer Center, please go directly to the Cancer Center and check in at the registration area.   Wear comfortable clothing and clothing appropriate for easy access to any Portacath or PICC line.   We strive to give you quality time with your provider. You may need to reschedule your appointment if you arrive late (15 or more minutes).  Arriving late affects you and other patients whose appointments are after yours.  Also, if you miss three or more appointments without notifying the office, you may be dismissed from the clinic at the provider's discretion.      For prescription refill requests, have your pharmacy contact our office and allow 72 hours for refills to be completed.    Today you received the following chemotherapy and/or immunotherapy agents: Oxaliplatin      To help prevent nausea and vomiting after your treatment, we encourage you to take your nausea medication as directed.  BELOW ARE SYMPTOMS THAT SHOULD BE REPORTED IMMEDIATELY: *FEVER GREATER THAN 100.4 F (38 C) OR HIGHER *CHILLS OR SWEATING *NAUSEA AND VOMITING THAT IS NOT CONTROLLED WITH YOUR NAUSEA MEDICATION *UNUSUAL SHORTNESS OF BREATH *UNUSUAL BRUISING OR BLEEDING *URINARY PROBLEMS (pain or burning when urinating, or frequent urination) *BOWEL PROBLEMS (unusual diarrhea, constipation, pain near the anus) TENDERNESS IN MOUTH AND THROAT WITH OR WITHOUT PRESENCE OF ULCERS (sore throat, sores in mouth, or a toothache) UNUSUAL RASH, SWELLING OR PAIN  UNUSUAL VAGINAL DISCHARGE OR ITCHING   Items with * indicate a potential emergency and should be followed up as soon as possible or go to the Emergency Department if any problems should occur.  Please show the CHEMOTHERAPY ALERT CARD or  IMMUNOTHERAPY ALERT CARD at check-in to the Emergency Department and triage nurse.  Should you have questions after your visit or need to cancel or reschedule your appointment, please contact [facility] - A DE[facility]  Dept: [phone]  and follow the prompts.  Office hours are 8:00 a.m. to 4:30 p.m. Monday - Friday. Please note that voicemails left after 4:00 p.m. may not be returned until the following business day.  We are closed weekends and major holidays. You have access to a nurse at all times for urgent questions. Please call the main number to the clinic Dept: [phone] and follow the prompts.   For any non-urgent questions, you may also contact your provider using MyChart. We now offer e-Visits for anyone 36 and older to request care online for non-urgent symptoms. For details visit [email or web address].   Also download the MyChart app! Go to the app store, search "MyChart", open the app, select [place], and log in with your MyChart username and password.

## 2023-09-04 NOTE — Progress Notes (Signed)
[facility]   [phone] [phone]   Clinic Follow up Note   Patient Care Team: [name], MD as PCP - General [name], RN (Inactive) as Oncology Nurse Navigator [name], NP as Nurse Practitioner (Nurse Practitioner) Malachy Mood, MD as Consulting Physician (Hematology and Oncology) Dorothy Puffer, MD as Consulting Physician (Radiation Oncology) [name], Netty Starring., MD as Consulting Physician (Gastroenterology) Josephine Igo, DO as Consulting Physician (Pulmonary Disease) Karie Soda, MD as Consulting Physician (General Surgery)  Date of Service:  09/04/2023  CHIEF COMPLAINT: f/u of rectal cancer  CURRENT THERAPY:  CapeOx and bevacizumab  Oncology History   Rectal adenocarcinoma (HCC) cT3cN1M0 stage IIIb, biopsy confirmed residual primary tumor and oligo lung met in 09/2021 -Initially diagnosed 11/2020 by colonoscopy for progressive rectal pain and bleeding, weight loss, and constipation. -Despite strong recommendation, patient firmly and repeatedly declined IV chemo and surgery. She agreed to chemoRT with Xeloda, and received the treatment 01/04/21 - 02/14/21. -she developed local cancer progression and lung mets in 11/2021 -she started Xeloda on 02/16/2022, low dose oxaliplatin was added on 05/11/22 (with cycle 4 of Xeloda)  -chemo held since 07/2022 due to leg pain and recurrent nausea  -restaging CT 09/07/2022 showed stable disease in rectum and lung, no new lesions.   -she had chemo break from Feb-May 2024 -restart CAPOX with dose reduction 12/18/2022, not tolerating very well overall.  Oxaliplatin was held on August 5.  -Restaging CT scan from 07/16/2023 showed stable rectal wall thickening, no other  evidence of metastasis   Left leg DVT (HCC) -Doppler on July 13, 2022 showed left acute DVT involving popliteal and peroneal vein, CTA chest was negative for PE. -continue Xarelto, tolerating well    Assessment  and Plan    Metastatic Rectal Cancer Follow-up for metastatic rectal cancer. Reports slight weight gain of two pounds. Eating less than normal but maintains a high-protein diet. Undergoing chemotherapy, completed 15-20 cycles since October 2023. Last scan in December showed stable disease. Experiences rectal pain managed with tramadol. Discussed port placement to avoid vein irritation and pain from IV infusions; risks include potential discomfort and infection. Patient agreed to port placement. Cancer is not curable; long-term treatment necessary. If neuropathy worsens, may need to stop chemotherapy. Patient prefers not to have surgery. - Refill tramadol, dexamethasone, and mirtazapine - Schedule port placement with interventional radiology within the next two weeks - Repeat scan in March or April - Continue chemotherapy as planned - Discuss shingles vaccine administration timing with patient  Rectal Pain Rectal pain associated with metastatic rectal cancer, managed with tramadol. No current constipation reported. - Continue tramadol for pain management - Monitor for constipation and use laxatives as needed  General Health Maintenance Discussed administration of shingles vaccine. Recommended to avoid the week after chemotherapy and to schedule during the second or third week when not on intravenous chemotherapy. - Administer shingles vaccine in 2-3 weeks, avoiding the week after chemotherapy  Plan -Lab reviewed, adequate for treatment, will proceed oxaliplatin and bevacizumab today -Continue Xeloda at home -IR port placement in next 2 to 3 weeks - Follow-up appointment in three weeks before chemo -Restaging CT scan in 5 weeks        SUMMARY OF ONCOLOGIC HISTORY: Oncology History Overview Note  Cancer Staging Rectal adenocarcinoma [facility]) Staging form: Colon and Rectum, AJCC 8th Edition - Clinical stage from 12/21/2020: Stage IIIB (cT3, cN1, cM0) - Unsigned    Rectal adenocarcinoma  (HCC)  08/26/2020 Miscellaneous  Initial presentation to PCP, reporting intermittent BRBPR since 02/2020    12/01/2020 Procedure   Colonoscopy by Dr. Lavon Paganini findings - The perianal and digital rectal examinations were normal. - A 15 mm polyp was found in the ascending colon. The polyp was semi-pedunculated. Resection and retrieval were complete. - A 25 mm polyp was found in the sigmoid colon. The polyp was pedunculated. Resection and retrieval were complete. - An infiltrative partially obstructing large mass was found in the proximal rectum. The mass was partially circumferential (involving one-half of the lumen circumference). The mass measured eight cm in length extending from 10 to 18cm from anal verge. This was biopsied with a cold forceps for histology. Proximal and distal opposite fold area of the mass lesion was tattooed with an injection of total 3 mL of Spot (carbon black).   12/01/2020 Initial Biopsy   Diagnosis 1. Colon, polyp(s), ascending x 1 - ADENOCARCINOMA ARISING IN A TUBULAR ADENOMA WITH HIGH-GRADE DYSPLASIA. SEE NOTE 2. Colon, sigmoid polyp, x1 - TUBULOVILLOUS ADENOMA(S) - NEGATIVE FOR HIGH-GRADE DYSPLASIA OR MALIGNANCY 3. Rectum, biopsy - ADENOCARCINOMA. SEE NOTE   12/01/2020 Cancer Staging   Cancer Staging Rectal adenocarcinoma [facility]) Staging form: Colon and Rectum, AJCC 8th Edition - Clinical stage from 12/21/2020: Stage IIIB (cT3, cN1, cM0) - Unsigned    12/06/2020 Imaging   CT CAP with contrast IMPRESSION: 1. There is partially circumferential soft tissue thickening of the mid to superior rectum, approximately 5 cm in length and the inferior extent approximately 6 cm above the anal verge, consistent with primary rectal malignancy identified by colonoscopy. 2. There appear to be abnormally enlarged perirectal lymph nodes posteriorly about the superior rectum measuring up to 1.0 x 0.8 cm. Findings are suspicious for perirectal nodal metastatic disease, however  rectal MRI is the test of choice for initial local staging of rectal cancer. 3. There is a 4 mm nonspecific pulmonary nodule of the superior segment left lower lobe, statistically most likely incidental, infectious or inflammatory, although nonspecific and isolated metastatic disease is not strictly excluded. Attention on follow-up. 4. No other evidence of metastatic disease in the chest, abdomen, or pelvis. Aortic Atherosclerosis (ICD10-I70.0).   12/09/2020 Imaging   Local staging MRI pelvis without contrast IMPRESSION: 4.9 cm circumferential mid/lower rectal mass, corresponding to the patient's newly diagnosed rectal cancer. Rectal adenocarcinoma T stage: T3c Rectal adenocarcinoma N stage:  N1 Distance from tumor to the internal anal sphincter is 3.7 cm.   12/21/2020 Initial Diagnosis   Rectal adenocarcinoma (HCC)   01/04/2021 -  Chemotherapy   Concurrent chemoradiation with Xeloda 1000mg  in the AM and 1500mg  in the PM on days of Radiation starting 01/04/21.       01/04/2021 - 02/11/2021 Radiation Therapy   Concurrent chemoradiation with Dr Mitzi Hansen and Xeloda starting 01/04/21.    01/31/2022 Procedure   Colonoscopy, Dr. Lavon Paganini  Findings: - An infiltrative partially obstructing large mass was found in the rectum. The mass was circumferential. The mass measured ten cm in length, extending from 5-15cm from anal verge. In addition, rectal diameter at narrowest approx twelve mm. Oozing was present. Biopsies were taken with a cold forceps for histology.  Impression: - Hemorrhoids found on perianal exam. - Likely malignant partially obstructing tumor in the rectum. Biopsied. - Non-bleeding external and internal hemorrhoids.   01/31/2022 Pathology Results   Diagnosis Rectum, biopsy INVASIVE MODERATELY DIFFERENTIATED ADENOCARCINOMA   05/11/2022 -  Chemotherapy   Patient is on Treatment Plan : COLORECTAL Xelox (Capeox)(130/850) q21d     09/07/2022  Imaging    IMPRESSION: 1. No  significant change in circumferential wall thickening of the rectum, in keeping with known primary rectal mass. Similar appearance of post treatment perirectal and presacral fat stranding and fascial thickening 2. Unchanged nodule of the superior segment left lower lobe with adjacent bandlike scarring. No new nodules. 3. No evidence of lymphadenopathy or other metastatic disease in the chest, abdomen, or pelvis. 4. Large burden of stool throughout the colon. 5. Cardiomegaly.   12/08/2022 Imaging    IMPRESSION: Persistent wall thickening along the rectum with adjacent severe inflammatory stranding and nodularity. Please correlate with exact location of the distribution of the patient's neoplasm.   No discrete new mass lesion, fluid collection or lymph node enlargement.   Improved visualization of the fractures involving the left side of the pubic symphysis as well as probable insufficiency fractures along the sacrum. Associated areas of increasing heterogeneous bony sclerosis along the pubic bones and sacrum. In principle sclerotic bone metastases would be in the differential but are felt to be less likely based on the overall appearance. If needed confirmatory MRI can be considered as clinically appropriate to further delineate.   Stable scarring and fibrotic changes along the lungs with a tiny nodular area along the superior segment of the left lower lobe. Simple continued follow up.   Enlarged heart.      Discussed the use of AI scribe software for clinical note transcription with the patient, who gave verbal consent to proceed.  History of Present Illness   A 73 year old patient with a history of metastatic rectal cancer presents for a follow-up visit. The patient reports a decreased appetite and has been eating less than normal, which has resulted in weight loss. Despite this, she manages to consume a bowl of rice for lunch and dinner, along with some snacks and coffee for  breakfast.  The patient also reports pain in the rectum, for which she has been taking tramadol. She takes one tablet in the morning and one in the evening. She also takes metoprolol, Compazine, dexamethasone, and mirtazapine. She has been experiencing drowsiness about an hour after taking mirtazapine, which she uses as a sleep aid.  The patient is also concerned about a potential rotator cuff injury in her right shoulder, which has been causing her discomfort for several weeks.  The patient's last scan from December showed stable disease, and another scan is planned for the end of March or April.         All other systems were reviewed with the patient and are negative.  MEDICAL HISTORY:  Past Medical History:  Diagnosis Date   Bilateral wrist pain 10/25/2017   Hypertension    Left leg DVT (deep venous thrombosis) (HCC) 06/2022   Neck mass 1998   Unknown biopsy results. Excised in [place].   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   Rectal cancer (HCC)    Trigger finger, acquired 02/08/2017    SURGICAL HISTORY: Past Surgical History:  Procedure Laterality Date   BRONCHIAL BIOPSY  12/13/2021   Procedure: BRONCHIAL BIOPSIES;  Surgeon: Josephine Igo, DO;  Location: [facility];  Service: Pulmonary;;   BRONCHIAL NEEDLE ASPIRATION BIOPSY  12/13/2021   Procedure: BRONCHIAL NEEDLE ASPIRATION BIOPSIES;  Surgeon: Josephine Igo, DO;  Location: [facility]OS[facility];  Service: Pulmonary;;   NECK SURGERY Left 1998   Excision of mass in [place]   VIDEO BRONCHOSCOPY WITH RADIAL ENDOBRONCHIAL ULTRASOUND  12/13/2021   Procedure: RADIAL ENDOBRONCHIAL ULTRASOUND;  Surgeon: Josephine Igo, DO;  Location: [facility] ENDOSCOPY;  Service: Pulmonary;;    I have reviewed the social history and family history with the patient and they are unchanged from previous note.  ALLERGIES:  is allergic to no known allergies.  MEDICATIONS:  Current Outpatient Medications  Medication  Sig Dispense Refill   amLODipine (NORVASC) 5 MG tablet Take 1 tablet (5 mg total) by mouth daily. 90 tablet 3   brimonidine (ALPHAGAN) 0.2 % ophthalmic solution Place 1 drop into the right eye 2 (two) times daily. 15 mL 3   brimonidine (ALPHAGAN) 0.2 % ophthalmic solution Place 1 drop into the right eye 2 (two) times daily. 10 mL 11   capecitabine (XELODA) 500 MG tablet Take 2 tabs every 12 hours, for 14 days then off for 7 days. Take after meal 56 tablet 2   dexamethasone (DECADRON) 4 MG tablet Take 1 tablet (4 mg total) by mouth daily. TAKE DAILY FOR 3-5 DAYS AFTER IV CHEMO 20 tablet 1   dorzolamide-timolol (COSOPT) 2-0.5 % ophthalmic solution Place 1 drop into both eyes 2 times daily. 30 mL 3   dorzolamide-timolol (COSOPT) 2-0.5 % ophthalmic solution Instill 1 drop into both eyes twice a day 10 mL 11   gabapentin (NEURONTIN) 100 MG capsule Take 1 capsule (100 mg total) by mouth 3 (three) times daily as needed. 90 capsule 2   latanoprost (XALATAN) 0.005 % ophthalmic solution Instill 1 drop into both eyes every night at bedtime 7.5 mL 3   latanoprost (XALATAN) 0.005 % ophthalmic solution Place 1 drop into both eyes at bedtime. 7.5 mL 6   lidocaine (XYLOCAINE) 5 % ointment Apply to anal area daily as needed. 50 g 1   lisinopril-hydrochlorothiazide (ZESTORETIC) 20-12.5 MG tablet Take 2 tablets by mouth daily. 60 tablet 3   mirtazapine (REMERON) 30 MG tablet Take 1 tablet (30 mg total) by mouth at bedtime. 30 tablet 3   Multiple Vitamins-Minerals (MULTIVITAMIN WITH MINERALS) tablet Take 1 tablet by mouth daily. Gummy 50 mg     ondansetron (ZOFRAN) 8 MG tablet Take 1 tablet (8 mg total) by mouth every 8 (eight) hours as needed for nausea or vomiting. Take after 3 days of chemo 30 tablet 2   polyethylene glycol powder (GLYCOLAX/MIRALAX) 17 GM/SCOOP powder Take 1 Container by mouth once.     prochlorperazine (COMPAZINE) 10 MG tablet Take 1 tablet (10 mg total) by mouth every 6 (six) hours as  needed. 30 tablet 2   rivaroxaban (XARELTO) 20 MG TABS tablet Take 1 tablet (20 mg total) by mouth daily with supper. 30 tablet 2   traMADol (ULTRAM) 50 MG tablet Take 1 tablet (50 mg total) by mouth every 6 (six) hours as needed. 90 tablet 0   urea (CARMOL) 10 % cream Apply topically 2 times daily as needed. 85 g 3   No current facility-administered medications for this visit.   Facility-Administered Medications Ordered in Other Visits  Medication Dose Route Frequency Provider Last Rate Last Admin   bevacizumab-adcd (VEGZELMA) 350 mg in sodium chloride 0.9 % 100 mL chemo infusion  7.5 mg/kg (Treatment Plan Recorded) Intravenous Once Malachy Mood, MD       dextrose 5 % solution   Intravenous Once Malachy Mood, MD       famotidine (PEPCID) IVPB 20 mg premix  20 mg Intravenous Once Malachy Mood, MD 200 mL/hr at 09/04/23 1116 20 mg at 09/04/23 1116   oxaliplatin (ELOXATIN) 100 mg in dextrose 5 % 500 mL  chemo infusion  70 mg/m2 (Treatment Plan Recorded) Intravenous Once Malachy Mood, MD        PHYSICAL EXAMINATION: ECOG PERFORMANCE STATUS: 2 - Symptomatic, <50% confined to bed  Vitals:   09/04/23 0947  BP: 116/83  Pulse: 75  Resp: 16  Temp: (!) 97.2 F (36.2 C)  SpO2: 99%   Wt Readings from Last 3 Encounters:  09/04/23 100 lb (45.4 kg)  08/27/23 98 lb (44.5 kg)  08/13/23 100 lb 4.8 oz (45.5 kg)     GENERAL:alert, no distress and comfortable SKIN: skin color, texture, turgor are normal, no rashes or significant lesions EYES: normal, Conjunctiva are pink and non-injected, sclera clear NECK: supple, thyroid normal size, non-tender, without nodularity LYMPH:  no palpable lymphadenopathy in the cervical, axillary  LUNGS: clear to auscultation and percussion with normal breathing effort HEART: regular rate & rhythm and no murmurs and no lower extremity edema ABDOMEN:abdomen soft, non-tender and normal bowel sounds Musculoskeletal:no cyanosis of digits and no clubbing  NEURO: alert & oriented x 3  with fluent speech, no focal motor/sensory deficits    LABORATORY DATA:  I have reviewed the data as listed    Latest Ref Rng & Units 09/04/2023    9:25 AM 08/13/2023   11:19 AM 07/23/2023   10:52 AM  CBC  WBC 4.0 - 10.5 K/uL 5.0  8.1  5.5   Hemoglobin 12.0 - 15.0 g/dL 95.6  21.3  08.6   Hematocrit 36.0 - 46.0 % 31.6  31.2  31.0   Platelets 150 - 400 K/uL 239  294  305         Latest Ref Rng & Units 09/04/2023    9:25 AM 08/13/2023   11:19 AM 07/23/2023   10:52 AM  CMP  Glucose 70 - 99 mg/dL 578  469  629   BUN 8 - 23 mg/dL 16  17  16    Creatinine 0.44 - 1.00 mg/dL 5.28  4.13  2.44   Sodium 135 - 145 mmol/L 139  136  136   Potassium 3.5 - 5.1 mmol/L 3.5  3.7  3.5   Chloride 98 - 111 mmol/L 105  103  102   CO2 22 - 32 mmol/L 29  28  28    Calcium 8.9 - 10.3 mg/dL 8.6  8.3  8.6   Total Protein 6.5 - 8.1 g/dL 6.7  6.6  7.1   Total Bilirubin 0.0 - 1.2 mg/dL 0.3  0.3  0.3   Alkaline Phos 38 - 126 U/L 65  56  62   AST 15 - 41 U/L 13  13  14    ALT 0 - 44 U/L <5  6  <5       RADIOGRAPHIC STUDIES: I have personally reviewed the radiological images as listed and agreed with the findings in the report. No results found.    Orders Placed This Encounter  Procedures   CT CHEST ABDOMEN PELVIS W CONTRAST    Standing Status:   Future    Expected Date:   10/09/2023    Expiration Date:   09/03/2024    If indicated for the ordered procedure, I authorize the administration of contrast media per Radiology protocol:   Yes    Does the patient have a contrast media/X-ray dye allergy?:   No    Preferred imaging location?:   [facility]    If indicated for the ordered procedure, I authorize the administration of oral contrast media per Radiology protocol:   Yes  IR IMAGING GUIDED PORT INSERTION    Standing Status:   Future    Expected Date:   09/11/2023    Expiration Date:   09/03/2024    Reason for Exam (SYMPTOM  OR DIAGNOSIS REQUIRED):   Chemotherapy    Preferred Imaging Location?:    [facility]   All questions were answered. The patient knows to call the clinic with any problems, questions or concerns. No barriers to learning was detected. Patient had many questions, especially the report placement, I answered to her satisfaction.  The total time spent in the appointment was 40 minutes.     Malachy Mood, MD 09/04/2023

## 2023-09-10 ENCOUNTER — Other Ambulatory Visit: Payer: Self-pay

## 2023-09-10 ENCOUNTER — Other Ambulatory Visit (HOSPITAL_COMMUNITY): Payer: Self-pay

## 2023-09-11 ENCOUNTER — Other Ambulatory Visit: Payer: Self-pay | Admitting: *Deleted

## 2023-09-11 ENCOUNTER — Other Ambulatory Visit: Payer: Self-pay

## 2023-09-12 NOTE — Congregational Nurse Program (Signed)
 Home visit with interpreter [name].  Purpose of visit was to discuss upcoming surgery to insert port-a-cath. CN answered questions and presented information shared yesterday from Nurse [facility] with [facility].  We also watched a short educational video about port-a-caths after which patient expressed desire to proceed with planned surgery.  CN will arrange transportation.  [name] has agreed to stay with patient following the procedure.  Patient requested that Y'Hin Boven be assigned as her interpreter.  This was shared with [facility] interpreter service who will attempt to honor this request.  [name] RN, Congregational Nurse [phone]

## 2023-09-13 ENCOUNTER — Telehealth: Payer: Self-pay

## 2023-09-13 NOTE — Telephone Encounter (Signed)
 Scheduled transportation for the following appointments:1) 09/17/2023 11:30 am at [facility]  2) 09/25/2023 9;00 am at [facility]  3) 10/09/2023 10:45 at [facility].  [name] RN, Congregational Nurse [phone]

## 2023-09-14 ENCOUNTER — Other Ambulatory Visit: Payer: Self-pay | Admitting: Radiology

## 2023-09-14 ENCOUNTER — Other Ambulatory Visit: Payer: Self-pay | Admitting: Pharmacy Technician

## 2023-09-14 ENCOUNTER — Other Ambulatory Visit: Payer: Self-pay

## 2023-09-14 NOTE — H&P (Addendum)
 Chief Complaint: rectal adenocarcinoma; chemotherapy - referred for image guided port a catheter placement   Referring Provider(s): Malachy Mood   Supervising Physician: Roanna Banning  Patient Status: Jillian Lutz - Out-pt  History of Present Illness: Jillian Lutz is a 73 y.o. 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 Mosetta Putt.  Pt was initially diagnosed with rectal adenocarcinoma 11/2020 by colonoscopy; she subsequently declined surgery and IV chemotherapy repeatedly, agreeing to Chemo RT.  By 11/2021 the rectal adenocarcinoma had spread locally and to the lung.  A CT obtained 07/16/23 revealed stable rectal wall thickening and no new metastasis.  Pt is now agreeable to IV chemotherapy and interventional radiology was consulted for image guided port a catheter placement.  She presents today for the procedure.   An interpreter was used for this visit.     Patient is Full Code  Past Medical History:  Diagnosis Date   Bilateral wrist pain 10/25/2017   Hypertension    Left leg DVT (deep venous thrombosis) (HCC) 06/2022   Neck mass 1998   Unknown biopsy results. Excised in [place].   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   Rectal cancer (HCC)    Trigger finger, acquired 02/08/2017    Past Surgical History:  Procedure Laterality Date   BRONCHIAL BIOPSY  12/13/2021   Procedure: BRONCHIAL BIOPSIES;  Surgeon: Josephine Igo, DO;  Location: [facility];  Service: Pulmonary;;   BRONCHIAL NEEDLE ASPIRATION BIOPSY  12/13/2021   Procedure: BRONCHIAL NEEDLE ASPIRATION BIOPSIES;  Surgeon: Josephine Igo, DO;  Location: [facility];  Service: Pulmonary;;   NECK SURGERY Left 1998   Excision of mass in [place]   VIDEO BRONCHOSCOPY WITH RADIAL ENDOBRONCHIAL ULTRASOUND  12/13/2021   Procedure: RADIAL ENDOBRONCHIAL ULTRASOUND;  Surgeon: Josephine Igo, DO;  Location: [facility];   Service: Pulmonary;;    Allergies: No known allergies  Medications: Prior to Admission medications   Medication Sig Start Date End Date Taking? Authorizing Provider  amLODipine (NORVASC) 5 MG tablet Take 1 tablet (5 mg total) by mouth daily. 11/02/22   Doran Stabler, DO  brimonidine (ALPHAGAN) 0.2 % ophthalmic solution Place 1 drop into the right eye 2 (two) times daily. 08/23/22     brimonidine (ALPHAGAN) 0.2 % ophthalmic solution Place 1 drop into the right eye 2 (two) times daily. 08/23/22     capecitabine (XELODA) 500 MG tablet Take 2 tabs every 12 hours, for 14 days then off for 7 days. Take after meal 07/23/23   Malachy Mood, MD  dexamethasone (DECADRON) 4 MG tablet Take 1 tablet (4 mg total) by mouth daily. TAKE DAILY FOR 3-5 DAYS AFTER IV CHEMO 09/04/23   Malachy Mood, MD  dorzolamide-timolol (COSOPT) 2-0.5 % ophthalmic solution Place 1 drop into both eyes 2 times daily. 08/23/22     dorzolamide-timolol (COSOPT) 2-0.5 % ophthalmic solution Instill 1 drop into both eyes twice a day 08/23/22     gabapentin (NEURONTIN) 100 MG capsule Take 1 capsule (100 mg total) by mouth 3 (three) times daily as needed. 04/24/23   [name], MD  latanoprost (XALATAN) 0.005 % ophthalmic solution Instill 1 drop into both eyes every night at bedtime 08/23/22     latanoprost (XALATAN) 0.005 % ophthalmic solution Place 1 drop into both eyes at bedtime. 08/23/22     lidocaine (XYLOCAINE) 5 % ointment Apply to anal area daily as  needed. 06/05/23   Malachy Mood, MD  lisinopril-hydrochlorothiazide (ZESTORETIC) 20-12.5 MG tablet Take 2 tablets by mouth daily. 07/23/23   [name], MD  mirtazapine (REMERON) 30 MG tablet Take 1 tablet (30 mg total) by mouth at bedtime. 09/04/23   Malachy Mood, MD  Multiple Vitamins-Minerals (MULTIVITAMIN WITH MINERALS) tablet Take 1 tablet by mouth daily. Gummy 50 mg    [provider]  ondansetron (ZOFRAN) 8 MG tablet Take 1 tablet (8 mg total) by mouth every 8 (eight) hours as  needed for nausea or vomiting. Take after 3 days of chemo 12/18/22   Malachy Mood, MD  polyethylene glycol powder (GLYCOLAX/MIRALAX) 17 GM/SCOOP powder Take 1 Container by mouth once.    [provider]  prochlorperazine (COMPAZINE) 10 MG tablet Take 1 tablet (10 mg total) by mouth every 6 (six) hours as needed. 12/18/22   Malachy Mood, MD  rivaroxaban (XARELTO) 20 MG TABS tablet Take 1 tablet (20 mg total) by mouth daily with supper. 07/23/23   Malachy Mood, MD  traMADol (ULTRAM) 50 MG tablet Take 1 tablet (50 mg total) by mouth every 6 (six) hours as needed. 09/04/23   Malachy Mood, MD  urea (CARMOL) 10 % cream Apply topically 2 times daily as needed. 06/22/22   [name], NP     Family History  Problem Relation Age of Onset   Headache Son    Colon cancer Neg Hx    Stomach cancer Neg Hx    Esophageal cancer Neg Hx     Social History   Socioeconomic History   Marital status: Married    Spouse name: Designer, jewellery   Number of children: Not on file   Years of education: Not on file   Highest education level: Not on file  Occupational History   Not on file  Tobacco Use   Smoking status: Never   Smokeless tobacco: Never  Vaping Use   Vaping status: Never Used  Substance and Sexual Activity   Alcohol use: No   Drug use: No   Sexual activity: Not on file  Other Topics Concern   Not on file  Social History Narrative   Came to [place] from [place] in 2013.   Does not know much family history [place] medical care.   Social Drivers of Corporate investment banker Strain: Not on file  Food Insecurity: No Food Insecurity (05/15/2023)   Hunger Vital Sign    Worried About Running Out of Food in the Last Year: Never true    Ran Out of Food in the Last Year: Never true  Transportation Needs: No Transportation Needs (05/10/2022)   PRAPARE - Administrator, Civil Service (Medical): No    Lack of Transportation (Non-Medical): No   Physical Activity: Not on file  Stress: Not on file  Social Connections: Not on file       Review of Systems denies fever,HA,CP,dyspnea, cough, abd.back pain,N/V or bleeding  Vital Signs: Vitals:   09/17/23 1139  BP: 134/76  Pulse: 65  Resp: 16  Temp: 98 F (36.7 C)  SpO2: 100%     Advance Care Plan: no documents on file  Physical Exam: awake/alert; chest- CTA bilat; heart- RRR; abd-soft,+BS,NT; no LE edema  Imaging: No results found.  Labs:  CBC: Recent Labs    06/05/23 1033 07/23/23 1052 08/13/23 1119 09/04/23 0925  WBC 4.3 5.5 8.1 5.0  HGB 10.4* 10.1* 10.0*  10.1*  HCT 30.7* 31.0* 31.2* 31.6*  PLT 250 305 294 239    COAGS: No results for input(s): "INR", "APTT" in the last 8760 hours.  BMP: Recent Labs    06/05/23 1033 07/23/23 1052 08/13/23 1119 09/04/23 0925  NA 139 136 136 139  K 3.7 3.5 3.7 3.5  CL 107 102 103 105  CO2 27 28 28 29   GLUCOSE 78 122* 131* 126*  BUN 14 16 17 16   CALCIUM 8.9 8.6* 8.3* 8.6*  CREATININE 0.71 0.83 0.82 0.81  GFRNONAA >60 >60 >60 >60    LIVER FUNCTION TESTS: Recent Labs    06/05/23 1033 07/23/23 1052 08/13/23 1119 09/04/23 0925  BILITOT 0.3 0.3 0.3 0.3  AST 17 14* 13* 13*  ALT <5 <5 6 <5  ALKPHOS 67 62 56 65  PROT 7.3 7.1 6.6 6.7  ALBUMIN 3.7 3.7 3.4* 3.5    TUMOR MARKERS: Recent Labs    06/05/23 1033 07/23/23 1052 08/13/23 1119 09/04/23 0925  CEA 12.89* 18.73* 16.50* 15.90*    Assessment and Plan:  Pt with hx metastatic rectal adenocarcinoma ; scheduled today  for image guided port a catheter placement   An interpreter was used for this consultation.    Risks and benefits of image guided port-a-catheter placement was discussed with the patient including, but not limited to bleeding, infection, pneumothorax, or fibrin sheath development and need for additional procedures.  All of the patient's questions were answered, patient is agreeable to proceed. Consent signed and in chart.  Thank  you for allowing our service to participate in Jhada Clyatt 's care.  Electronically Signed: Loman Brooklyn, PA-C  Caryn Bee Glorian Mcdonell,PA-C 09/14/2023, 10:33 AM    I spent a total of 30 minutes   in face to face in clinical consultation, greater than 50% of which was counseling/coordinating care for image guided port a catheter placement.

## 2023-09-14 NOTE — Progress Notes (Signed)
 Specialty Pharmacy Refill Coordination Note  Jillian Lutz is a 73 y.o. female contacted today regarding refills of specialty medication(s) Capecitabine (XELODA)  Spoke with  [name], RN (patient's congregational nurse)  Patient requested [name] at [facility] at [place] date: 09/21/23   Medication will be filled on 09/20/23.

## 2023-09-17 ENCOUNTER — Ambulatory Visit (HOSPITAL_COMMUNITY)
Admission: RE | Admit: 2023-09-17 | Discharge: 2023-09-17 | Disposition: A | Discharge status: Home / Self Care | Payer: Medicaid Other | Source: Ambulatory Visit | Attending: Hematology

## 2023-09-17 ENCOUNTER — Other Ambulatory Visit: Payer: Self-pay

## 2023-09-17 ENCOUNTER — Encounter (HOSPITAL_COMMUNITY): Payer: Self-pay

## 2023-09-17 ENCOUNTER — Ambulatory Visit (HOSPITAL_COMMUNITY)
Admission: RE | Admit: 2023-09-17 | Discharge: 2023-09-17 | Disposition: A | Discharge status: Home / Self Care | Payer: Medicaid Other | Source: Ambulatory Visit | Attending: Hematology | Admitting: Hematology

## 2023-09-17 DIAGNOSIS — Z86718 Personal history of other venous thrombosis and embolism: Secondary | ICD-10-CM | POA: Insufficient documentation

## 2023-09-17 DIAGNOSIS — C20 Malignant neoplasm of rectum: Secondary | ICD-10-CM | POA: Diagnosis not present

## 2023-09-17 DIAGNOSIS — C7800 Secondary malignant neoplasm of unspecified lung: Secondary | ICD-10-CM | POA: Diagnosis not present

## 2023-09-17 DIAGNOSIS — I10 Essential (primary) hypertension: Secondary | ICD-10-CM | POA: Insufficient documentation

## 2023-09-17 HISTORY — PX: IR IMAGING GUIDED PORT INSERTION: IMG5740

## 2023-09-17 MED ORDER — LIDOCAINE-EPINEPHRINE 1 %-1:100000 IJ SOLN
INTRAMUSCULAR | Status: AC
  Filled 2023-09-17: qty 1

## 2023-09-17 MED ORDER — HEPARIN SOD (PORK) LOCK FLUSH 100 UNIT/ML IV SOLN
INTRAVENOUS | Status: AC
  Filled 2023-09-17: qty 5

## 2023-09-17 MED ORDER — HEPARIN SOD (PORK) LOCK FLUSH 100 UNIT/ML IV SOLN
500.0000 [IU] | Freq: Once | INTRAVENOUS | Status: AC
  Administered 2023-09-17: 500 [IU]

## 2023-09-17 MED ORDER — LIDOCAINE-EPINEPHRINE 1 %-1:100000 IJ SOLN
20.0000 mL | Freq: Once | INTRAMUSCULAR | Status: AC
  Administered 2023-09-17: 20 mL via INTRADERMAL

## 2023-09-17 MED ORDER — MIDAZOLAM HCL 2 MG/2ML IJ SOLN
INTRAMUSCULAR | Status: AC | PRN
Start: 2023-09-17 — End: 2023-09-17
  Administered 2023-09-17 (×2): 1 mg via INTRAVENOUS

## 2023-09-17 MED ORDER — FENTANYL CITRATE (PF) 100 MCG/2ML IJ SOLN
INTRAMUSCULAR | Status: AC | PRN
  Administered 2023-09-17: 50 ug via INTRAVENOUS

## 2023-09-17 MED ORDER — SODIUM CHLORIDE 0.9 % IV SOLN: INTRAVENOUS | Status: DC

## 2023-09-17 MED ORDER — FENTANYL CITRATE (PF) 100 MCG/2ML IJ SOLN
INTRAMUSCULAR | Status: AC
  Filled 2023-09-17: qty 2

## 2023-09-17 MED ORDER — MIDAZOLAM HCL 2 MG/2ML IJ SOLN
INTRAMUSCULAR | Status: AC
  Filled 2023-09-17: qty 2

## 2023-09-17 NOTE — Procedures (Signed)
 Vascular and Interventional Radiology Procedure Note  Patient: Jillian Lutz DOB: [birth year] Medical Record Number: [id number] Note Date/Time: 09/17/23 1:01 PM   Performing Physician: Roanna Banning, MD Assistant(s): None  Diagnosis: Colorectal CA  Procedure: PORT PLACEMENT  Anesthesia: Conscious Sedation Complications: None Estimated Blood Loss: Minimal  Findings:  Successful right-sided port placement, with the tip of the catheter in the proximal right atrium.  Plan: Catheter ready for use.  See detailed procedure note with images in PACS. The patient tolerated the procedure well without incident or complication and was returned to Recovery in stable condition.    Roanna Banning, MD [facility] Rad[facility]   Pager. [phone] Clinic. [phone]

## 2023-09-17 NOTE — Progress Notes (Signed)
 1515 Ice bag given to use prn to right upper neck and right upper chest.

## 2023-09-17 NOTE — Discharge Instructions (Signed)
 Implanted Port Insertion, Care After  The following information offers guidance on how to care for yourself after your procedure. Your health care provider may also give you more specific instructions. If you have problems or questions, contact your health care provider.  What can I expect after the procedure? After the procedure, it is common to have: Discomfort at the port insertion site. Bruising on the skin over the port. This should improve over 3-4 days.   Urgent needs - Interventional Radiology, clinic [phone] (mon-fri 8-5).   Wound - May remove dressing and shower in 24 to 48 hours.  Keep site clean and dry.  Replace with bandaid as needed.  Do not submerge in tub or water until site healing well. If closed with glue, glue will flake off on its own.   If ordered by your provider, may start Emla cream (or any other creams ointments or lotions) in 2 weeks or after incision is healed. Port is ready for use immediately.   After completion of treatment, your provider should have you set up for monthly port flushes.   Follow these instructions at home: [facility] care After your port is placed, you will get a manufacturer's information card. The card has information about your port. Keep this card with you at all times. Take care of the port as told by your health care provider. Ask your health care provider if you or a family member can get training for taking care of the port at home. A home health care nurse will be be available to help care for the port. Make sure to remember what type of port you have. Incision care     Follow instructions from your health care provider about how to take care of your port insertion site. Make sure you: Wash your hands with soap and water for at least 20 seconds before and after you change your bandage (dressing). If soap and water are not available, use hand sanitizer. Change your dressing as told by your health care provider. Leave stitches  (sutures), skin glue, or adhesive strips in place. These skin closures may need to stay in place for 2 weeks or longer. If adhesive strip edges start to loosen and curl up, you may trim the loose edges. Do not remove adhesive strips completely unless your health care provider tells you to do that. Check your port insertion site every day for signs of infection. Check for: Redness, swelling, or pain. Fluid or blood. Warmth. Pus or a bad smell. Activity Return to your normal activities as told by your health care provider. Ask your health care provider what activities are safe for you. You may have to avoid lifting. Ask your health care provider how much you can safely lift. General instructions Take over-the-counter and prescription medicines only as told by your health care provider. Do not take baths, swim, or use a hot tub until your health care provider approves. Ask your health care provider if you may take showers. You may only be allowed to take sponge baths. If you were given a sedative during the procedure, it can affect you for several hours. Do not drive or operate machinery until your health care provider says that it is safe. Wear a medical alert bracelet in case of an emergency. This will tell any health care providers that you have a port. Keep all follow-up visits. This is important. Contact a health care provider if: You cannot flush your port with saline as directed, or you cannot  draw blood from the port. You have a fever or chills. You have redness, swelling, or pain around your port insertion site. You have fluid or blood coming from your port insertion site. Your port insertion site feels warm to the touch. You have pus or a bad smell coming from the port insertion site. Get help right away if: You have chest pain or shortness of breath. You have bleeding from your port that you cannot control. These symptoms may be an emergency. Get help right away. Call 911. Do not  wait to see if the symptoms will go away. Do not drive yourself to the hospital. Summary Take care of the port as told by your health care provider. Keep the manufacturer's information card with you at all times. Change your dressing as told by your health care provider. Contact a health care provider if you have a fever or chills or if you have redness, swelling, or pain around your port insertion site. Keep all follow-up visits. This information is not intended to replace advice given to you by your health care provider. Make sure you discuss any questions you have with your health care provider. Document Revised: 01/04/2021 Document Reviewed: 01/04/2021 Elsevier Patient Education  2023 Elsevier Inc.    Moderate Conscious Sedation  Adult  Care After (English)  After the procedure, it is common to have: Sleepiness for a few hours. Impaired judgment for a few hours. Trouble with balance. Nausea or vomiting if you eat too soon. Follow these instructions at home: For the time period you were told by your health care provider:  Rest. Do not participate in activities where you could fall or become injured. Do not drive or use machinery. Do not drink alcohol. Do not take sleeping pills or medicines that cause drowsiness. Do not make important decisions or sign legal documents. Do not take care of children on your own. Eating and drinking Follow instructions from your health care provider about what you may eat and drink. Drink enough fluid to keep your urine pale yellow. If you vomit: Drink clear fluids slowly and in small amounts as you are able. Clear fluids include water, ice chips, low-calorie sports drinks, and fruit juice that has water added to it (diluted fruit juice). Eat light and bland foods in small amounts as you are able. These foods include bananas, applesauce, rice, lean meats, toast, and crackers. General instructions Take over-the-counter and prescription medicines  only as told by your health care provider. Have a responsible adult stay with you for the time you are told. Do not use any products that contain nicotine or tobacco. These products include cigarettes, chewing tobacco, and vaping devices, such as e-cigarettes. If you need help quitting, ask your health care provider. Return to your normal activities as told by your health care provider. Ask your health care provider what activities are safe for you. Your health care provider may give you more instructions. Make sure you know what you can and cannot do. Contact a health care provider if: You are still sleepy or having trouble with balance after 24 hours. You feel light-headed. You vomit every time you eat or drink. You get a rash. You have a fever. You have redness or swelling around the IV site. Get help right away if: You have trouble breathing. You start to feel confused at home. These symptoms may be an emergency. Get help right away. Call 911. Do not wait to see if the symptoms will go away. Do not  drive yourself to the hospital. This information is not intended to replace advice given to you by your health care provider. Make sure you discuss any questions you have with your health care provider.

## 2023-09-20 ENCOUNTER — Other Ambulatory Visit: Payer: Self-pay

## 2023-09-23 ENCOUNTER — Other Ambulatory Visit: Payer: Self-pay

## 2023-09-24 ENCOUNTER — Ambulatory Visit: Payer: Medicaid Other

## 2023-09-24 ENCOUNTER — Ambulatory Visit: Payer: Medicaid Other | Admitting: Hematology

## 2023-09-24 ENCOUNTER — Other Ambulatory Visit (HOSPITAL_COMMUNITY): Payer: Self-pay

## 2023-09-24 ENCOUNTER — Other Ambulatory Visit: Payer: Medicaid Other

## 2023-09-24 ENCOUNTER — Other Ambulatory Visit: Payer: Self-pay

## 2023-09-24 NOTE — Assessment & Plan Note (Signed)
 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 -Despite strong recommendation, patient firmly and repeatedly declined IV chemo and surgery. She agreed to chemoRT with Xeloda, and received the treatment 01/04/21 - 02/14/21. -she developed local cancer progression and lung mets in 11/2021 -she started Xeloda on 02/16/2022, low dose oxaliplatin was added on 05/11/22 (with cycle 4 of Xeloda)  -chemo held since 07/2022 due to leg pain and recurrent nausea  -restaging CT 09/07/2022 showed stable disease in rectum and lung, no new lesions.   -she had chemo break from Feb-May 2024 -restart CAPOX with dose reduction 12/18/2022, not tolerating very well overall.  Oxaliplatin was held on August 5. She is on oxaliplatin every 6 weeks now.  -Restaging CT scan from 07/16/2023 showed stable rectal wall thickening, no other  evidence of metastasis

## 2023-09-24 NOTE — Congregational Nurse Program (Signed)
 CN picked up Xeloda and Mirtazapine from [facility] and took them to patient.  Interpreter [name] went over instructions with patient.  Patient still had several Xeloda tablets remaining from last round stating she did not take them because she had not eaten.  Will report this at appointment tomorrow.  Advised her to go ahead and start next round in the morning. Told her to be ready for pick-up at 8:15 tomorrow morning.  [name] RN, Congregational Nurse [phone]

## 2023-09-25 ENCOUNTER — Inpatient Hospital Stay: Payer: Medicaid Other

## 2023-09-25 ENCOUNTER — Encounter: Payer: Self-pay | Admitting: Hematology

## 2023-09-25 ENCOUNTER — Other Ambulatory Visit (HOSPITAL_COMMUNITY): Payer: Self-pay

## 2023-09-25 ENCOUNTER — Inpatient Hospital Stay (HOSPITAL_BASED_OUTPATIENT_CLINIC_OR_DEPARTMENT_OTHER)

## 2023-09-25 ENCOUNTER — Other Ambulatory Visit: Payer: Medicaid Other

## 2023-09-25 ENCOUNTER — Other Ambulatory Visit: Payer: Self-pay

## 2023-09-25 ENCOUNTER — Inpatient Hospital Stay: Payer: Medicaid Other | Attending: Physician Assistant | Admitting: Hematology

## 2023-09-25 VITALS — BP 114/60 | HR 69 | Temp 98.2°F | Resp 17 | Ht <= 58 in | Wt 103.9 lb

## 2023-09-25 DIAGNOSIS — C20 Malignant neoplasm of rectum: Secondary | ICD-10-CM

## 2023-09-25 DIAGNOSIS — Z5111 Encounter for antineoplastic chemotherapy: Secondary | ICD-10-CM | POA: Diagnosis present

## 2023-09-25 DIAGNOSIS — C7800 Secondary malignant neoplasm of unspecified lung: Secondary | ICD-10-CM | POA: Insufficient documentation

## 2023-09-25 LAB — TOTAL PROTEIN, URINE DIPSTICK: Protein, ur: NEGATIVE mg/dL

## 2023-09-25 LAB — CBC WITH DIFFERENTIAL (CANCER CENTER ONLY)
Abs Immature Granulocytes: 0.01 10*3/uL (ref 0.00–0.07)
Basophils Absolute: 0.1 10*3/uL (ref 0.0–0.1)
Basophils Relative: 1 %
Eosinophils Absolute: 0.2 10*3/uL (ref 0.0–0.5)
Eosinophils Relative: 4 %
HCT: 29.2 % — ABNORMAL LOW (ref 36.0–46.0)
Hemoglobin: 9.3 g/dL — ABNORMAL LOW (ref 12.0–15.0)
Immature Granulocytes: 0 %
Lymphocytes Relative: 25 %
Lymphs Abs: 1.1 10*3/uL (ref 0.7–4.0)
MCH: 24.3 pg — ABNORMAL LOW (ref 26.0–34.0)
MCHC: 31.8 g/dL (ref 30.0–36.0)
MCV: 76.4 fL — ABNORMAL LOW (ref 80.0–100.0)
Monocytes Absolute: 0.8 10*3/uL (ref 0.1–1.0)
Monocytes Relative: 20 %
Neutro Abs: 2 10*3/uL (ref 1.7–7.7)
Neutrophils Relative %: 50 %
Platelet Count: 232 10*3/uL (ref 150–400)
RBC: 3.82 MIL/uL — ABNORMAL LOW (ref 3.87–5.11)
RDW: 19.4 % — ABNORMAL HIGH (ref 11.5–15.5)
WBC Count: 4.1 10*3/uL (ref 4.0–10.5)
nRBC: 0 % (ref 0.0–0.2)

## 2023-09-25 MED ORDER — SODIUM CHLORIDE 0.9% FLUSH
10.0000 mL | INTRAVENOUS | Status: DC | PRN
  Administered 2023-09-25: 10 mL

## 2023-09-25 MED ORDER — SODIUM CHLORIDE 0.9 % IV SOLN
7.5000 mg/kg | Freq: Once | INTRAVENOUS | Status: AC
  Administered 2023-09-25: 350 mg via INTRAVENOUS
  Filled 2023-09-25: qty 14

## 2023-09-25 MED ORDER — HEPARIN SOD (PORK) LOCK FLUSH 100 UNIT/ML IV SOLN
500.0000 [IU] | Freq: Once | INTRAVENOUS | Status: AC | PRN
  Administered 2023-09-25: 500 [IU]

## 2023-09-25 MED ORDER — SODIUM CHLORIDE 0.9 % IV SOLN: Freq: Once | INTRAVENOUS | Status: AC

## 2023-09-25 MED ORDER — CAPECITABINE 500 MG PO TABS
ORAL_TABLET | ORAL | 2 refills | Status: DC
  Filled 2023-09-25: qty 56, fill #0
  Filled 2023-10-08: qty 56, 14d supply, fill #0
  Filled 2023-11-08: qty 56, 14d supply, fill #1
  Filled 2023-11-14: qty 56, 14d supply, fill #2

## 2023-09-25 NOTE — Progress Notes (Signed)
 [facility]   [phone] [phone]   Clinic Follow up Note   Patient Care Team: [name], MD as PCP - General [name], RN (Inactive) as Oncology Nurse Navigator [name], NP as Nurse Practitioner (Nurse Practitioner) Malachy Mood, MD as Consulting Physician (Hematology and Oncology) Dorothy Puffer, MD as Consulting Physician (Radiation Oncology) [name], Netty Starring., MD as Consulting Physician (Gastroenterology) Josephine Igo, DO as Consulting Physician (Pulmonary Disease) Karie Soda, MD as Consulting Physician (General Surgery)  Date of Service:  09/25/2023  CHIEF COMPLAINT: f/u of rectal cancer  CURRENT THERAPY:  CapeOx and bevacizumab every 3 weeks, with oxaliplatin every 6 weeks  Oncology History   Rectal adenocarcinoma (HCC) cT3cN1M0 stage IIIb, biopsy confirmed residual primary tumor and oligo lung met in 09/2021 -Initially diagnosed 11/2020 by colonoscopy for progressive rectal pain and bleeding, weight loss, and constipation. -Despite strong recommendation, patient firmly and repeatedly declined IV chemo and surgery. She agreed to chemoRT with Xeloda, and received the treatment 01/04/21 - 02/14/21. -she developed local cancer progression and lung mets in 11/2021 -she started Xeloda on 02/16/2022, low dose oxaliplatin was added on 05/11/22 (with cycle 4 of Xeloda)  -chemo held since 07/2022 due to leg pain and recurrent nausea  -restaging CT 09/07/2022 showed stable disease in rectum and lung, no new lesions.   -she had chemo break from Feb-May 2024 -restart CAPOX with dose reduction 12/18/2022, not tolerating very well overall.  Oxaliplatin was held on August 5. She is on oxaliplatin every 6 weeks now.  -Restaging CT scan from 07/16/2023 showed stable rectal wall thickening, no other  evidence of metastasis     Assessment and Plan    Metastatic rectal cancer Undergoing treatment for metastatic rectal cancer.  Experienced loss of appetite during the first week of chemotherapy, leading to non-compliance with capecitabine (Xeloda). Appetite has since improved, and medication has been resumed. Currently receiving bevacizumab; oxaliplatin not administered today. Concerns about the infusion port were addressed; it will be removed before discharge. Educated on using a calendar or pill box to maintain medication compliance. - Continue capecitabine (Xeloda) with a dosing schedule of two pills twice a day for two weeks followed by a one-week break. - Administer bevacizumab infusion today. - Ensure the port is removed before discharge. - Educate on using a calendar or pill box to maintain medication compliance. - Schedule a comprehensive metabolic panel (CMP) for the next visit. - Follow up in three weeks.     Plan -Lab reviewed, adequate for treatment, will proceed to bevacizumab today -She will continue Xeloda for 3 more days to complete this cycle, and take 1 week off, then start next cycle -Follow-up in 3 weeks before next cycle CapeOx and bevacizumab    SUMMARY OF ONCOLOGIC HISTORY: Oncology History Overview Note  Cancer Staging Rectal adenocarcinoma [facility]) Staging form: Colon and Rectum, AJCC 8th Edition - Clinical stage from 12/21/2020: Stage IIIB (cT3, cN1, cM0) - Unsigned    Rectal adenocarcinoma (HCC)  08/26/2020 Miscellaneous   Initial presentation to PCP, reporting intermittent BRBPR since 02/2020    12/01/2020 Procedure   Colonoscopy by Dr. Lavon Paganini findings - The perianal and digital rectal examinations were normal. - A 15 mm polyp was found in the ascending colon. The polyp was semi-pedunculated. Resection and retrieval were complete. - A 25 mm polyp was found in the sigmoid colon. The polyp was pedunculated. Resection and retrieval were complete. - An infiltrative partially obstructing large mass was found in the  proximal rectum. The mass was partially circumferential (involving one-half of  the lumen circumference). The mass measured eight cm in length extending from 10 to 18cm from anal verge. This was biopsied with a cold forceps for histology. Proximal and distal opposite fold area of the mass lesion was tattooed with an injection of total 3 mL of Spot (carbon black).   12/01/2020 Initial Biopsy   Diagnosis 1. Colon, polyp(s), ascending x 1 - ADENOCARCINOMA ARISING IN A TUBULAR ADENOMA WITH HIGH-GRADE DYSPLASIA. SEE NOTE 2. Colon, sigmoid polyp, x1 - TUBULOVILLOUS ADENOMA(S) - NEGATIVE FOR HIGH-GRADE DYSPLASIA OR MALIGNANCY 3. Rectum, biopsy - ADENOCARCINOMA. SEE NOTE   12/01/2020 Cancer Staging   Cancer Staging Rectal adenocarcinoma [facility]) Staging form: Colon and Rectum, AJCC 8th Edition - Clinical stage from 12/21/2020: Stage IIIB (cT3, cN1, cM0) - Unsigned    12/06/2020 Imaging   CT CAP with contrast IMPRESSION: 1. There is partially circumferential soft tissue thickening of the mid to superior rectum, approximately 5 cm in length and the inferior extent approximately 6 cm above the anal verge, consistent with primary rectal malignancy identified by colonoscopy. 2. There appear to be abnormally enlarged perirectal lymph nodes posteriorly about the superior rectum measuring up to 1.0 x 0.8 cm. Findings are suspicious for perirectal nodal metastatic disease, however rectal MRI is the test of choice for initial local staging of rectal cancer. 3. There is a 4 mm nonspecific pulmonary nodule of the superior segment left lower lobe, statistically most likely incidental, infectious or inflammatory, although nonspecific and isolated metastatic disease is not strictly excluded. Attention on follow-up. 4. No other evidence of metastatic disease in the chest, abdomen, or pelvis. Aortic Atherosclerosis (ICD10-I70.0).   12/09/2020 Imaging   Local staging MRI pelvis without contrast IMPRESSION: 4.9 cm circumferential mid/lower rectal mass, corresponding to the patient's newly  diagnosed rectal cancer. Rectal adenocarcinoma T stage: T3c Rectal adenocarcinoma N stage:  N1 Distance from tumor to the internal anal sphincter is 3.7 cm.   12/21/2020 Initial Diagnosis   Rectal adenocarcinoma (HCC)   01/04/2021 -  Chemotherapy   Concurrent chemoradiation with Xeloda 1000mg  in the AM and 1500mg  in the PM on days of Radiation starting 01/04/21.       01/04/2021 - 02/11/2021 Radiation Therapy   Concurrent chemoradiation with Dr Mitzi Hansen and Xeloda starting 01/04/21.    01/31/2022 Procedure   Colonoscopy, Dr. Lavon Paganini  Findings: - An infiltrative partially obstructing large mass was found in the rectum. The mass was circumferential. The mass measured ten cm in length, extending from 5-15cm from anal verge. In addition, rectal diameter at narrowest approx twelve mm. Oozing was present. Biopsies were taken with a cold forceps for histology.  Impression: - Hemorrhoids found on perianal exam. - Likely malignant partially obstructing tumor in the rectum. Biopsied. - Non-bleeding external and internal hemorrhoids.   01/31/2022 Pathology Results   Diagnosis Rectum, biopsy INVASIVE MODERATELY DIFFERENTIATED ADENOCARCINOMA   05/11/2022 -  Chemotherapy   Patient is on Treatment Plan : COLORECTAL Xelox (Capeox)(130/850) q21d     09/07/2022 Imaging    IMPRESSION: 1. No significant change in circumferential wall thickening of the rectum, in keeping with known primary rectal mass. Similar appearance of post treatment perirectal and presacral fat stranding and fascial thickening 2. Unchanged nodule of the superior segment left lower lobe with adjacent bandlike scarring. No new nodules. 3. No evidence of lymphadenopathy or other metastatic disease in the chest, abdomen, or pelvis. 4. Large burden of stool throughout the colon. 5. Cardiomegaly.  12/08/2022 Imaging    IMPRESSION: Persistent wall thickening along the rectum with adjacent severe inflammatory stranding and  nodularity. Please correlate with exact location of the distribution of the patient's neoplasm.   No discrete new mass lesion, fluid collection or lymph node enlargement.   Improved visualization of the fractures involving the left side of the pubic symphysis as well as probable insufficiency fractures along the sacrum. Associated areas of increasing heterogeneous bony sclerosis along the pubic bones and sacrum. In principle sclerotic bone metastases would be in the differential but are felt to be less likely based on the overall appearance. If needed confirmatory MRI can be considered as clinically appropriate to further delineate.   Stable scarring and fibrotic changes along the lungs with a tiny nodular area along the superior segment of the left lower lobe. Simple continued follow up.   Enlarged heart.      Discussed the use of AI scribe software for clinical note transcription with the patient, who gave verbal consent to proceed.  History of Present Illness   A 73 year old patient with a history of metastatic rectal cancer presents for a follow-up visit after chemotherapy. The patient reports a significant loss of appetite following her last chemotherapy session, which lasted for about a week. This loss of appetite led to her not taking her prescribed Zlota medication for that duration. Once her appetite returned, she resumed taking the medication. However, she admits to having difficulty keeping track of her medication schedule, which is supposed to be two weeks on and one week off. She plans to improve her adherence by writing down her schedule and using a pill box. The patient's weight has remained stable, with a gain of three pounds since her last visit.         All other systems were reviewed with the patient and are negative.  MEDICAL HISTORY:  Past Medical History:  Diagnosis Date   Bilateral wrist pain 10/25/2017   Hypertension    Left leg DVT (deep venous  thrombosis) (HCC) 06/2022   Neck mass 1998   Unknown biopsy results. Excised in [place].   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   Rectal cancer (HCC)    Trigger finger, acquired 02/08/2017    SURGICAL HISTORY: Past Surgical History:  Procedure Laterality Date   BRONCHIAL BIOPSY  12/13/2021   Procedure: BRONCHIAL BIOPSIES;  Surgeon: Josephine Igo, DO;  Location: [facility];  Service: Pulmonary;;   BRONCHIAL NEEDLE ASPIRATION BIOPSY  12/13/2021   Procedure: BRONCHIAL NEEDLE ASPIRATION BIOPSIES;  Surgeon: Josephine Igo, DO;  Location: [facility];  Service: Pulmonary;;   IR IMAGING GUIDED PORT INSERTION  09/17/2023   NECK SURGERY Left 1998   Excision of mass in [place]   VIDEO BRONCHOSCOPY WITH RADIAL ENDOBRONCHIAL ULTRASOUND  12/13/2021   Procedure: RADIAL ENDOBRONCHIAL ULTRASOUND;  Surgeon: Josephine Igo, DO;  Location: [facility];  Service: Pulmonary;;    I have reviewed the social history and family history with the patient and they are unchanged from previous note.  ALLERGIES:  is allergic to no known allergies.  MEDICATIONS:  Current Outpatient Medications  Medication Sig Dispense Refill   amLODipine (NORVASC) 5 MG tablet Take 1 tablet (5 mg total) by mouth daily. 90 tablet 3   brimonidine (ALPHAGAN) 0.2 % ophthalmic solution Place 1 drop into the right eye 2 (two) times daily. 15 mL 3   brimonidine (ALPHAGAN) 0.2 % ophthalmic solution Place 1 drop into the right eye 2 (  two) times daily. 10 mL 11   dexamethasone (DECADRON) 4 MG tablet Take 1 tablet (4 mg total) by mouth daily. TAKE DAILY FOR 3-5 DAYS AFTER IV CHEMO 20 tablet 1   dorzolamide-timolol (COSOPT) 2-0.5 % ophthalmic solution Place 1 drop into both eyes 2 times daily. 30 mL 3   dorzolamide-timolol (COSOPT) 2-0.5 % ophthalmic solution Instill 1 drop into both eyes twice a day 10 mL 11   gabapentin (NEURONTIN) 100 MG capsule Take 1 capsule (100 mg total) by mouth 3 (three) times  daily as needed. 90 capsule 2   lidocaine (XYLOCAINE) 5 % ointment Apply to anal area daily as needed. 50 g 1   lisinopril-hydrochlorothiazide (ZESTORETIC) 20-12.5 MG tablet Take 2 tablets by mouth daily. 60 tablet 3   mirtazapine (REMERON) 30 MG tablet Take 1 tablet (30 mg total) by mouth at bedtime. 30 tablet 3   Multiple Vitamins-Minerals (MULTIVITAMIN WITH MINERALS) tablet Take 1 tablet by mouth daily. Gummy 50 mg     ondansetron (ZOFRAN) 8 MG tablet Take 1 tablet (8 mg total) by mouth every 8 (eight) hours as needed for nausea or vomiting. Take after 3 days of chemo 30 tablet 2   polyethylene glycol powder (GLYCOLAX/MIRALAX) 17 GM/SCOOP powder Take 1 Container by mouth once.     prochlorperazine (COMPAZINE) 10 MG tablet Take 1 tablet (10 mg total) by mouth every 6 (six) hours as needed. 30 tablet 2   rivaroxaban (XARELTO) 20 MG TABS tablet Take 1 tablet (20 mg total) by mouth daily with supper. 30 tablet 2   traMADol (ULTRAM) 50 MG tablet Take 1 tablet (50 mg total) by mouth every 6 (six) hours as needed. 90 tablet 0   urea (CARMOL) 10 % cream Apply topically 2 times daily as needed. 85 g 3   capecitabine (XELODA) 500 MG tablet Take 2 tabs every 12 hours, for 14 days then off for 7 days. Take after meal 56 tablet 2   No current facility-administered medications for this visit.   Facility-Administered Medications Ordered in Other Visits  Medication Dose Route Frequency Provider Last Rate Last Admin   bevacizumab-adcd (VEGZELMA) 350 mg in sodium chloride 0.9 % 100 mL chemo infusion  7.5 mg/kg (Treatment Plan Recorded) Intravenous Once Malachy Mood, MD       heparin lock flush 100 unit/mL  500 Units Intracatheter Once PRN Malachy Mood, MD       sodium chloride flush (NS) 0.9 % injection 10 mL  10 mL Intracatheter PRN Malachy Mood, MD        PHYSICAL EXAMINATION: ECOG PERFORMANCE STATUS: 1 - Symptomatic but completely ambulatory  Vitals:   09/25/23 1025  BP: 114/60  Pulse: 69  Resp: 17   Temp: 98.2 F (36.8 C)  SpO2: 98%   Wt Readings from Last 3 Encounters:  09/25/23 103 lb 14.4 oz (47.1 kg)  09/17/23 100 lb 1.4 oz (45.4 kg)  09/04/23 100 lb (45.4 kg)     GENERAL:alert, no distress and comfortable SKIN: skin color, texture, turgor are normal, no rashes or significant lesions EYES: normal, Conjunctiva are pink and non-injected, sclera clear NECK: supple, thyroid normal size, non-tender, without nodularity LYMPH:  no palpable lymphadenopathy in the cervical, axillary  LUNGS: clear to auscultation and percussion with normal breathing effort HEART: regular rate & rhythm and no murmurs and no lower extremity edema ABDOMEN:abdomen soft, non-tender and normal bowel sounds Musculoskeletal:no cyanosis of digits and no clubbing  NEURO: alert & oriented x 3 with fluent speech,  no focal motor/sensory deficits   LABORATORY DATA:  I have reviewed the data as listed    Latest Ref Rng & Units 09/25/2023    9:47 AM 09/04/2023    9:25 AM 08/13/2023   11:19 AM  CBC  WBC 4.0 - 10.5 K/uL 4.1  5.0  8.1   Hemoglobin 12.0 - 15.0 g/dL 9.3  52.8  41.3   Hematocrit 36.0 - 46.0 % 29.2  31.6  31.2   Platelets 150 - 400 K/uL 232  239  294         Latest Ref Rng & Units 09/04/2023    9:25 AM 08/13/2023   11:19 AM 07/23/2023   10:52 AM  CMP  Glucose 70 - 99 mg/dL 244  010  272   BUN 8 - 23 mg/dL 16  17  16    Creatinine 0.44 - 1.00 mg/dL 5.36  6.44  0.34   Sodium 135 - 145 mmol/L 139  136  136   Potassium 3.5 - 5.1 mmol/L 3.5  3.7  3.5   Chloride 98 - 111 mmol/L 105  103  102   CO2 22 - 32 mmol/L 29  28  28    Calcium 8.9 - 10.3 mg/dL 8.6  8.3  8.6   Total Protein 6.5 - 8.1 g/dL 6.7  6.6  7.1   Total Bilirubin 0.0 - 1.2 mg/dL 0.3  0.3  0.3   Alkaline Phos 38 - 126 U/L 65  56  62   AST 15 - 41 U/L 13  13  14    ALT 0 - 44 U/L <5  6  <5       RADIOGRAPHIC STUDIES: I have personally reviewed the radiological images as listed and agreed with the findings in the report. No results  found.    No orders of the defined types were placed in this encounter.  All questions were answered. The patient knows to call the clinic with any problems, questions or concerns. No barriers to learning was detected. The total time spent in the appointment was 30 minutes.     Malachy Mood, MD 09/25/2023

## 2023-09-25 NOTE — Patient Instructions (Addendum)
 [facility] - A DEPT OF [facility]  Discharge Instructions: Thank you for choosing [facility] to provide your oncology and hematology care.   If you have a lab appointment with the Cancer Center, please go directly to the Cancer Center and check in at the registration area.   Wear comfortable clothing and clothing appropriate for easy access to any Portacath or PICC line.   We strive to give you quality time with your provider. You may need to reschedule your appointment if you arrive late (15 or more minutes).  Arriving late affects you and other patients whose appointments are after yours.  Also, if you miss three or more appointments without notifying the office, you may be dismissed from the clinic at the provider's discretion.      For prescription refill requests, have your pharmacy contact our office and allow 72 hours for refills to be completed.    Today you received the following chemotherapy and/or immunotherapy agents: Bevacizumab      To help prevent nausea and vomiting after your treatment, we encourage you to take your nausea medication as directed.  BELOW ARE SYMPTOMS THAT SHOULD BE REPORTED IMMEDIATELY: *FEVER GREATER THAN 100.4 F (38 C) OR HIGHER *CHILLS OR SWEATING *NAUSEA AND VOMITING THAT IS NOT CONTROLLED WITH YOUR NAUSEA MEDICATION *UNUSUAL SHORTNESS OF BREATH *UNUSUAL BRUISING OR BLEEDING *URINARY PROBLEMS (pain or burning when urinating, or frequent urination) *BOWEL PROBLEMS (unusual diarrhea, constipation, pain near the anus) TENDERNESS IN MOUTH AND THROAT WITH OR WITHOUT PRESENCE OF ULCERS (sore throat, sores in mouth, or a toothache) UNUSUAL RASH, SWELLING OR PAIN  UNUSUAL VAGINAL DISCHARGE OR ITCHING   Items with * indicate a potential emergency and should be followed up as soon as possible or go to the Emergency Department if any problems should occur.  Please show the CHEMOTHERAPY ALERT CARD or  IMMUNOTHERAPY ALERT CARD at check-in to the Emergency Department and triage nurse.  Should you have questions after your visit or need to cancel or reschedule your appointment, please contact [facility] - A DEPT OF [facility]  Dept: [phone]  and follow the prompts.  Office hours are 8:00 a.m. to 4:30 p.m. Monday - Friday. Please note that voicemails left after 4:00 p.m. may not be returned until the following business day.  We are closed weekends and major holidays. You have access to a nurse at all times for urgent questions. Please call the main number to the clinic Dept: [phone] and follow the prompts.   For any non-urgent questions, you may also contact your provider using MyChart. We now offer e-Visits for anyone 26 and older to request care online for non-urgent symptoms. For details visit [email or web address].   Also download the MyChart app! Go to the app store, search "MyChart", open the app, select [place], and log in with your MyChart username and password.  Bevacizumab Injection ?y l thu?c g? BEVACIZUMAB (??c l be va SIZ yoo mab) ?i?u tr? m?t s? lo?i ung th?. Thu?c ny tc d?ng b?ng cch ng?n ch?n m?t lo?i protein khi?n t? bo ung th? pht tri?n v nhn ln. ?i?u ny gip lm ch?m ho?c ng?n ch?n s? ly lan c?a cc t? bo ung th?. ?y l khng th? ??n dng. Thu?c ny c th? ???c dng cho nh?ng m?c ?ch khc; hy h?i ng??i cung c?p d?ch v? y t? ho?c d??c s? c?a mnh, n?u qu  v? c th?c m?c. (CC) NHN HI?U PH? BI?N: Alymsys, Avastin, MVASI, Vegzalma, Zirabev Ti c?n ph?i bo cho ng??i cung c?p d?ch v? y t? c?a mnh ?i?u g tr??c khi dng thu?c ny? H? c?n bi?t li?u qu v? hi?n c b?t k? tnh tr?ng no sau ?y hay khng: C?c mu ?ng Ho ra mu ?ang gi?i ph?u ho?c gi?i ph?u m?i ?y Suy tim Huy?t a?p cao Ti?n s? c ch? thng gi?a 2 ho?c nhi?u b? ph?n c? th? th??ng khng thng nhau (l? r) Ti?n s? rch d? dy ho?c ru?t C protein  trong n??c ti?u c?a qu v? Pha?n ??ng b?t th???ng ho??c ph?n ?ng di? ??ng v??i bevacizumab, ca?c d??c ph?m kha?c, th?c ph?m, thu?c nhu?mu, ho??c ch?t ba?o qua?n ?ang c thai ho??c ??nh co? thai ?ang cho con bu? Ti nn s? d?ng thu?c ny nh? th? no? Thu?c ny ?? tim vo t?nh m?ch. Thu?c ny ???c ??i ng? ch?m [place] c?a qu v? cho dng t?i b?nh vi?n ho?c phng m?ch. Hy h?i ??i ng? ch?m [place] c?a qu v? v? vi?c dng thu?c ny ? tr? em. C th? c?n ch?m [place] ??c bi?t. Qu li?u: N?u qu v? cho r?ng mnh ? dng qu nhi?u thu?c ny, th hy lin l?c v?i trung tm ki?m sot ch?t ??c ho?c phng c?p c?u ngay l?p t?c.<br />L?U : Thu?c ny ch? dnh ring cho qu v?. Khng chia s? thu?c ny v?i nh?ng ng??i khc. N?u ti l? qun m?t li?u th sao? Hy ??n cc cu?c h?n khm ?? nh?n cc li?u thu?c ti?p theo. ?i?u quan tr?ng l khng nn b? l? li?u thu?c c?a qu v?. Hy g?i cho ??i ng? ch?m [place] c?a mnh n?u qu v? khng th? ??n m?t cu?c h?n khm. Nh?ng g c th? t??ng tc v?i thu?c ny? Cc t??ng tc v?i thu?c khng x?y ra. Danh sch ny c th? khng m t? ?? h?t cc t??ng tc c th? x?y ra. Hy ??a cho ng??i cung c?p d?ch v? y t? c?a mnh danh sch t?t c? cc thu?c, th?o d??c, cc thu?c khng c?n toa, ho?c cc ch? ph?m b? sung m qu v? dng. C?ng nn bo cho h? bi?t r?ng qu v? c ht thu?c, u?ng r??u, ho?c c s? d?ng ma ty tri php hay khng. Vi th? c th? t??ng tc v?i thu?c c?a qu v?. Ti c?n ph?i theo di ?i?u g trong khi dng thu?c ny? Tnh tr?ng c?a qu v? se? ????c theo do?i c?n th?n trong khi qu v? ?ang du?ng thu?c na?y. Qu v? c th? c?n ?i lm xt nghi?m mu trong khi ?ang dng thu?c ny. Thu?c ny c th? lm cho qu v? c?m th?y khng ???c kh?e nh? th??ng l?. ?i?u ny khng ph?i l hi?m g?p, v thu?c ha tr? li?u c th? tc ??ng ln t? bo lnh l?n t? bo ung th?. Hy t??ng trnh m?i tc d?ng ph?. Hy ti?p t?c ??t ?i?u tr? c?a mnh ngay c? khi qu v? c?m th?y m?t, tr? khi ??i ng? ch?m [place] yu  c?u qu v? ng?ng ?i?u tr?. Thu?c ny c th? lm t?ng nguy c? b? b?m tm ho?c ch?y mu. Hy g?i cho ??i ng? ch?m [place], n?u qu v? ??  th?y b?t k? tnh tr?ng ch?y mu b?t th??ng no. Tr??c khi gi?i ph?u, hy bn b?c v?i ??i ng? ch?m [place] c?a qu v? ?? ??m b?o c th? ti?n hnh gi?i ph?u. Thu?c ny c th? lm t?ng nguy c? v?t  th??ng ho?c v?t m? ch?m lnh. Qu v? s? c?n ng?ng dng thu?c ny 28 ngy tr??c khi gi?i ph?u. Sau khi gi?i ph?u, hy ??i t nh?t 28 ngy tr??c khi b?t ??u dng l?i thu?c ny. ??m b?o v?t m? ho?c v?t th??ng ? ?? lnh tr??c khi b?t ??u dng l?i thu?c ny. Hy h?i ??i ng? ch?m [place] c?a mnh, n?u c th?c m?c. Hy h?i ??i ng? ch?m [place] c?a mnh, n?u qu v? c [place]. Thu?c ny c th? gy d? t?t b?m sinh nghim tr?ng n?u dng trong [place] k? v trong 6 thng sau li?u thu?c cu?i cng. Nn trnh [place] trong khi dng thu?c ny v trong 6 thng sau li?u thu?c cu?i cng. ??i ng? ch?m [place] c th? gip qu v? tm ra l?a ch?n ph h?p v?i qu v?. Khng ???c cho tr? b m? trong khi dng thu?c ny v trong vng 6 thng sau khi dng li?u thu?c cu?i cng. Thu?c ny c th? gy v sinh. Hy th?o lu?n v?i ??i ng? ch?m [place] c?a mnh, n?u qu v? lo l??ng v? kha? n?ng sinh s?n c?a mnh. Ti c th? nh?n th?y nh?ng tc d?ng ph? no khi dng thu?c ny? Nh?ng tc d?ng ph? qu v? c?n ph?i bo cho ??i ng? ch?m [place] cng s?m cng t?t: Cc ph?n ?ng d? ?ng -- da b? n?i ban, ng?a, n?i my ?ay, s?ng ? m?t, mi, l??i, ho?c h?ng Xu?t huy?t -- phn c mu ho?c mu ?en, mu h?c n, i ra mu ho?c ch?t mu nu gi?ng b c ph, n??c ti?u mu ?? ho?c nu s?m, cc ??m nh? mu ?? ho?c tm trn da, b?m tm ho?c ch?y mu b?t th??ng C?c mu ?ng -- ?au, s?ng ho?c nng ? chn, kh th?, ?au ng?c Nh?i mu c? tim -- ?au ho?c t?c ? ng?c, vai, cnh tay ho?c hm, bu?n i, kh th?, da l?nh ho?c ?m ??t, c?m th?y y?u ?t ho?c chong vng Suy tim -- kh th?, s?ng m?t c chn, bn chn ho?c bn tay, t?ng cn ??t ng?t, y?u ?t ho?c m?t m?i khc th??ng T?ng  huy?t p Nhi?m trng -- s?t, ?n l?nh, ho, ?au h?ng, v?t th??ng khng lnh, ?au khi ?i ti?u ho?c kh ?i ti?u, c?m gic kh ch?u chung ho?c c?m th?y khng kh?e Ph?n ?ng truy?n d?ch -- ?au ng?c, kh th? ho?c kh th?, c?m th?y y?u ?t ho?c chong vng T?n th??ng th?n -- gi?m l??ng n??c ti?u, s?ng ? m?t c chn, bn tay ho?c bn chn ?au bao t? d? d?i, khng h?t ho?c tr? nn t?i t? h?n ??t qu? -- ??t ng?t b? t ho?c y?u ? m?t, cnh tay ho?c chn, ni kh, l l?n, kh ?i l?i, m?t th?ng b?ng ho?c ph?i h?p ??ng tc, chng m?t, ?au ??u d? d?i, thay ??i th? l?c ?au ??u ??t ng?t v d? d?i, l l?n, thay ??i th? l?c, co gi?t, c th? l d?u hi?u c?a h?i ch?ng b?nh no sau c h?i ph?c (PRES) Cc tc d?ng ph? th??ng khng c?n ph?i ch?m [place] y t? (hy bo cho ??i ng? ch?m [place] n?u cc tc d?ng ph? ny ti?p di?n ho?c gy phi?n toi): ?au l?ng Thay ??i v? gic Tiu ch?y Da kh Ch?y nhi?u n??c m?t Ch?y mu cam Danh sch ny c th? khng m t? ?? h?t cc tc d?ng ph? c th? x?y ra. Xin g?i t?i bc s? c?a mnh ?? ???c c? v?n chuyn mn v? cc tc d?ng  ph?. Qu v? c th? t??ng trnh cc tc d?ng ph? cho FDA theo s? [phone]. Ti nn c?t gi? thu?c c?a mnh ? ?u? Thu?c ny th??ng ???c cho dng t?i b?nh vi?n ho?c phng m?ch. Thu?c ny khng ???c c?t gi? t?i nh. L?U : ?y l b?n tm t?t. N c th? khng bao hm t?t c? thng tin c th? c. N?u qu v? th?c m?c v? thu?c ny, xin trao ??i v?i bc s?, d??c s?, ho?c ng??i cung c?p d?ch v? y t? c?a mnh.  2024 Elsevier/Gold Standard (2022-05-24 00:00:00)

## 2023-09-25 NOTE — Congregational Nurse Program (Signed)
 Accompanied patient to appointment at [facility],  She arrived late due to transportation issues but was able to be seen and receive infusion.  Questions were answered regarding her port and she did well with treatment.  She missed several doses of the last round of xeloda due to inability to eat following last IV chemo.  Dr. Mosetta Putt instructed her to finish remaining pills (12) and then take 1 week off before starting next round for 2 weeks.  Patient agreed to be more vigilant in taking medication.  [name] RN, Congregational Nurse [phone]

## 2023-09-26 ENCOUNTER — Other Ambulatory Visit: Payer: Self-pay

## 2023-09-26 NOTE — Congregational Nurse Program (Signed)
 Home visit with interpreter [name].  Reminded patient of appointment for CT scan on 10/09/2023 and that she needs to arrive at 10:45 am.  Medicaid transportation has been scheduled to arrive at 10:00 am.  We also reviewed instructions for xeloda which she had placed in pill box so she will not forget to take.  [name] RN, Congregational Nurse [phone]

## 2023-09-27 ENCOUNTER — Other Ambulatory Visit: Payer: Self-pay

## 2023-10-03 NOTE — Congregational Nurse Program (Signed)
 Home visit with interpreter [name].  Patient had questions regarding appointment on 10/09/2023.  We informed her it is for a C-T scan of chest, abdomen, and pelvis.  She needs to arrive at 10:45 am to [facility].  She will drink contrast at 11:00 am and 12:00 noon. Scan is scheduled for 1:00 pm. Medicaid transportation is supposed to pick her up at 10:00 am.  [name] RN, Congregational Nurse [phone]

## 2023-10-04 ENCOUNTER — Other Ambulatory Visit: Payer: Self-pay

## 2023-10-07 ENCOUNTER — Other Ambulatory Visit: Payer: Self-pay

## 2023-10-08 ENCOUNTER — Other Ambulatory Visit: Payer: Self-pay

## 2023-10-08 NOTE — Progress Notes (Signed)
 Specialty Pharmacy Refill Coordination Note  Jillian Lutz is a 73 y.o. female contacted today regarding refills of specialty medication(s) Capecitabine (XELODA)   Patient requested [name] at [facility] at [place] date: 10/22/23   Medication will be filled on 10/19/23.

## 2023-10-08 NOTE — Congregational Nurse Program (Signed)
 Phone call to patient to remind her of C-T scan scheduled for tomorrow and to inquire where is is with her Xeloda cycle.  Stated she completed the last round on 09/28/2023 but has not started the next course which should have begun on 10/05/2023.  Instructed her to start it today and take them for the next 2 weeks until all gone.  She agreed and expressed understanding. CN will call [facility] with this information to establish date for next refill/pickup.   [name] RN, Congregational Nurse [phone]

## 2023-10-09 ENCOUNTER — Ambulatory Visit (HOSPITAL_COMMUNITY)
Admission: RE | Admit: 2023-10-09 | Discharge: 2023-10-09 | Disposition: A | Discharge status: Home / Self Care | Payer: Medicaid Other | Source: Ambulatory Visit | Attending: Hematology | Admitting: Hematology

## 2023-10-09 ENCOUNTER — Other Ambulatory Visit (HOSPITAL_COMMUNITY): Payer: Self-pay

## 2023-10-09 DIAGNOSIS — R935 Abnormal findings on diagnostic imaging of other abdominal regions, including retroperitoneum: Secondary | ICD-10-CM | POA: Diagnosis not present

## 2023-10-09 DIAGNOSIS — C20 Malignant neoplasm of rectum: Secondary | ICD-10-CM | POA: Diagnosis not present

## 2023-10-09 DIAGNOSIS — I700 Atherosclerosis of aorta: Secondary | ICD-10-CM | POA: Diagnosis not present

## 2023-10-09 MED ORDER — IOHEXOL 9 MG/ML PO SOLN
1000.0000 mL | Freq: Once | ORAL | Status: AC
  Administered 2023-10-09: 1000 mL via ORAL

## 2023-10-09 MED ORDER — HEPARIN SOD (PORK) LOCK FLUSH 100 UNIT/ML IV SOLN
500.0000 [IU] | Freq: Once | INTRAVENOUS | Status: AC
  Administered 2023-10-09: 500 [IU] via INTRAVENOUS

## 2023-10-09 MED ORDER — HEPARIN SOD (PORK) LOCK FLUSH 100 UNIT/ML IV SOLN
INTRAVENOUS | Status: AC
  Filled 2023-10-09: qty 5

## 2023-10-09 MED ORDER — IOHEXOL 300 MG/ML  SOLN
100.0000 mL | Freq: Once | INTRAMUSCULAR | Status: AC | PRN
  Administered 2023-10-09: 100 mL via INTRAVENOUS

## 2023-10-14 NOTE — Assessment & Plan Note (Signed)
 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 -Despite strong recommendation, patient firmly and repeatedly declined IV chemo and surgery. She agreed to chemoRT with Xeloda, and received the treatment 01/04/21 - 02/14/21. -she developed local cancer progression and lung mets in 11/2021 -she started Xeloda on 02/16/2022, low dose oxaliplatin was added on 05/11/22 (with cycle 4 of Xeloda)  -chemo held since 07/2022 due to leg pain and recurrent nausea  -restaging CT 09/07/2022 showed stable disease in rectum and lung, no new lesions.   -she had chemo break from Feb-May 2024 -restart CAPOX with dose reduction 12/18/2022, not tolerating very well overall.  Oxaliplatin was held on August 5. She is on oxaliplatin every 6 weeks now.  -Restaging CT scan from 07/16/2023 showed stable rectal wall thickening, no other  evidence of metastasis

## 2023-10-14 NOTE — Progress Notes (Unsigned)
 Patient Care Team: Jillian Macadamia, MD as PCP - General [name], RN (Inactive) as Oncology Nurse Navigator [name], NP as Nurse Practitioner (Nurse Practitioner) Malachy Mood, MD as Consulting Physician (Hematology and Oncology) Dorothy Puffer, MD as Consulting Physician (Radiation Oncology) [name], Netty Starring., MD as Consulting Physician (Gastroenterology) Josephine Igo, DO as Consulting Physician (Pulmonary Disease) Karie Soda, MD as Consulting Physician (General Surgery)  Clinic Day:  10/16/2023  Referring physician: Malachy Mood, MD  ASSESSMENT & PLAN:   Assessment & Plan: 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 -Despite strong recommendation, patient firmly and repeatedly declined IV chemo and surgery. She agreed to chemoRT with Xeloda, and received the treatment 01/04/21 - 02/14/21. -she developed local cancer progression and lung mets in 11/2021 -she started Xeloda on 02/16/2022, low dose oxaliplatin was added on 05/11/22 (with cycle 4 of Xeloda)  -chemo held since 07/2022 due to leg pain and recurrent nausea  -restaging CT 09/07/2022 showed stable disease in rectum and lung, no new lesions.   -she had chemo break from Feb-May 2024 -restart CAPOX with dose reduction 12/18/2022, not tolerating very well overall.  Oxaliplatin was held on August 5. She is on oxaliplatin every 6 weeks now.  -Restaging CT scan from 07/16/2023 showed stable rectal wall thickening, no other  evidence of metastasis  -10/09/2023 - restaging CT CAP showed overall stable disease. Concern for rectal tumor causing obstruction. Patient advised to contact cancer center if she goes more than 3 to 4 days without bowel movement. May need surgical referral for ostomy if obstruction does occur as she has already had  radiation to the mass. She did voice understanding.  --offered to increase oxaliplatin to every 3 weeks. She prefers to keep this at every 6 weeks.  --continue with bevacizumab every 3 weeks and oxaliplatin every 6 weeks.  --continue xeloda 1000 mg twice daily for 14 days on and 7 days off. Next cycle to start 10/23/2023.     Constipation related to rectal tumor Reviewed CT scan results with patient showing heavy stool burden throughout the colon.  Concerned that rectal tumor is leading to obstruction.  Patient states that since her scan, she has been taking MiraLAX every day she is also using gentle laxative.  Has been having normal bowel movements every day.  There is no blood in her stool and no pain associated with bowel movements.  Did discuss possible future need for ostomy if bowel obstruction occurs.  Offered to increase frequency of oxaliplatin to every 3 weeks.  Patient prefers to keep it is every 6 weeks, with bevacizumab every 3.  Will continue with Xeloda for 14 days on and 1 week off.  Hypokalemia Potassium 3.4 today. Add KCL at 10 mEq daily. New prescription sent to her pharmacy   Mild anemia  Stable and mild iron deficiency anemia. Labs will continue to be monitored closely. Treat anemia with iron and/or blood as indicated.   Plan:  The patient was seen along with Dr. Mosetta Putt today  Labs reviewed.  -treat hypokalemia with KCL at 10 mEq daily.  -monitor CBC closely and treat anemia as indicated  Reviewed CT CAP with patient  -overall stable disease. Concern for rectal tumor causing obstruction. Patient advised to contact cancer center if she goes more than 3 to 4 days without bowel movement. May need surgical referral  for ostomy if obstruction does occur as she has already had radiation to the mass. She did voice understanding.  --offered to increase oxaliplatin to every 3 weeks. She prefers to keep this at every 6 weeks.  -proceed with treatment today with oxaliplatin and  bevacizumab.  -continue xeloda 1000 mg twice daily for 14 days on and 7 days off. She is set to restart on Tuesday 10/23/2023. A refill is set to be picked up on Monday 10/22/2023.  -labs/flush, follow up, and bevacizumab in 3 weeks  -labsflush, follow up, and bevacizumab and oxaliplatin in 6 weeks.   The patient understands the plans discussed today and is in agreement with them.  She knows to contact our office if she develops concerns prior to her next appointment.  I provided 30 minutes of face-to-face time during this encounter and > 50% was spent counseling as documented under my assessment and plan.    Carlean Jews, NP  [facility] - A DEPT OF [facility] [address] [place] [address] Dept: [phone] Dept Fax: [phone]   No orders of the defined types were placed in this encounter.     CHIEF COMPLAINT:  CC: Rectal cancer  Current Treatment: CapeOx and bevacizumab every 3 weeks, with oxaliplatin every 6 weeks  INTERVAL HISTORY:  Jillian Lutz is here today for repeat clinical assessment.  She was last seen by Dr. Mosetta Putt on 09/25/2023.  Today is cycle 14 day 1.  She will receive oxaliplatin and bevacizumab.  She did have CT CAP done on 10/09/2023.  It did show continued circumferential wall thickening in the rectum, not much changed from previous studies.  There was heavy stool burden throughout the colon, proximal to the rectum.  Stricture related to rectal neoplasm is a possibility.  No evidence to suggest progressive or metastatic disease.  She reports having episode of constipation which lasted for several days just prior to CT scan. States that she is now taking miralax daily along with mild stool softener. Is no longer having trouble with this. She denies chest pain, chest pressure, or shortness of breath. She denies headaches or visual disturbances. She denies abdominal pain, nausea, or vomiting. She denies  excess fatigue. Denies cold sensitvity or development of neuropathy in the extremities.   She denies fevers or chills. She denies pain. Her appetite is fair. Her weight has been stable.  I have reviewed the past medical history, past surgical history, social history and family history with the patient and they are unchanged from previous note.  ALLERGIES:  is allergic to no known allergies.  MEDICATIONS:  Current Outpatient Medications  Medication Sig Dispense Refill   amLODipine (NORVASC) 5 MG tablet Take 1 tablet (5 mg total) by mouth daily. 90 tablet 3   brimonidine (ALPHAGAN) 0.2 % ophthalmic solution Place 1 drop into the right eye 2 (two) times daily. 15 mL 3   brimonidine (ALPHAGAN) 0.2 % ophthalmic solution Place 1 drop into the right eye 2 (two) times daily. 10 mL 11   capecitabine (XELODA) 500 MG tablet Take 2 tabs every 12 hours, for 14 days then off for 7 days. Take after meal 56 tablet 2   dexamethasone (DECADRON) 4 MG tablet Take 1 tablet (4 mg total) by mouth daily. TAKE DAILY FOR 3-5 DAYS AFTER IV CHEMO 20 tablet 1   dorzolamide-timolol (COSOPT) 2-0.5 % ophthalmic solution Place 1 drop into both eyes 2 times daily.  30 mL 3   dorzolamide-timolol (COSOPT) 2-0.5 % ophthalmic solution Instill 1 drop into both eyes twice a day 10 mL 11   gabapentin (NEURONTIN) 100 MG capsule Take 1 capsule (100 mg total) by mouth 3 (three) times daily as needed. 90 capsule 2   lidocaine (XYLOCAINE) 5 % ointment Apply to anal area daily as needed. 50 g 1   lidocaine-prilocaine (EMLA) cream Apply topically daily as needed. 30 g 3   lisinopril-hydrochlorothiazide (ZESTORETIC) 20-12.5 MG tablet Take 2 tablets by mouth daily. 60 tablet 3   mirtazapine (REMERON) 30 MG tablet Take 1 tablet (30 mg total) by mouth at bedtime. 30 tablet 3   Multiple Vitamins-Minerals (MULTIVITAMIN WITH MINERALS) tablet Take 1 tablet by mouth daily. Gummy 50 mg     ondansetron (ZOFRAN) 8 MG tablet Take 1 tablet (8 mg  total) by mouth every 8 (eight) hours as needed for nausea or vomiting. Take after 3 days of chemo 30 tablet 2   polyethylene glycol powder (GLYCOLAX/MIRALAX) 17 GM/SCOOP powder Take 1 Container by mouth once.     potassium chloride (KLOR-CON M) 10 MEQ tablet Take 1 tablet (10 mEq total) by mouth daily. 30 tablet 2   prochlorperazine (COMPAZINE) 10 MG tablet Take 1 tablet (10 mg total) by mouth every 6 (six) hours as needed. 30 tablet 2   rivaroxaban (XARELTO) 20 MG TABS tablet Take 1 tablet (20 mg total) by mouth daily with supper. 30 tablet 2   urea (CARMOL) 10 % cream Apply topically 2 times daily as needed. 85 g 3   traMADol (ULTRAM) 50 MG tablet Take 1 tablet (50 mg total) by mouth every 6 (six) hours as needed. 90 tablet 0   No current facility-administered medications for this visit.   Facility-Administered Medications Ordered in Other Visits  Medication Dose Route Frequency Provider Last Rate Last Admin   bevacizumab-adcd (VEGZELMA) 350 mg in sodium chloride 0.9 % 100 mL chemo infusion  7.5 mg/kg (Treatment Plan Recorded) Intravenous Once Malachy Mood, MD       dextrose 5 % solution   Intravenous Once Malachy Mood, MD       famotidine (PEPCID) IVPB 20 mg premix  20 mg Intravenous Once Malachy Mood, MD 200 mL/hr at 10/16/23 1025 20 mg at 10/16/23 1025   heparin lock flush 100 unit/mL  500 Units Intracatheter Once PRN Malachy Mood, MD       oxaliplatin (ELOXATIN) 100 mg in dextrose 5 % 500 mL chemo infusion  70 mg/m2 (Treatment Plan Recorded) Intravenous Once Malachy Mood, MD       sodium chloride flush (NS) 0.9 % injection 10 mL  10 mL Intracatheter PRN Malachy Mood, MD        HISTORY OF PRESENT ILLNESS:   Oncology History Overview Note  Cancer Staging Rectal adenocarcinoma [facility]) Staging form: Colon and Rectum, AJCC 8th Edition - Clinical stage from 12/21/2020: Stage IIIB (cT3, cN1, cM0) - Unsigned    Rectal adenocarcinoma (HCC)  08/26/2020 Miscellaneous   Initial presentation to PCP, reporting  intermittent BRBPR since 02/2020    12/01/2020 Procedure   Colonoscopy by Dr. Lavon Paganini findings - The perianal and digital rectal examinations were normal. - A 15 mm polyp was found in the ascending colon. The polyp was semi-pedunculated. Resection and retrieval were complete. - A 25 mm polyp was found in the sigmoid colon. The polyp was pedunculated. Resection and retrieval were complete. - An infiltrative partially obstructing large mass was found in the proximal rectum. The mass  was partially circumferential (involving one-half of the lumen circumference). The mass measured eight cm in length extending from 10 to 18cm from anal verge. This was biopsied with a cold forceps for histology. Proximal and distal opposite fold area of the mass lesion was tattooed with an injection of total 3 mL of Spot (carbon black).   12/01/2020 Initial Biopsy   Diagnosis 1. Colon, polyp(s), ascending x 1 - ADENOCARCINOMA ARISING IN A TUBULAR ADENOMA WITH HIGH-GRADE DYSPLASIA. SEE NOTE 2. Colon, sigmoid polyp, x1 - TUBULOVILLOUS ADENOMA(S) - NEGATIVE FOR HIGH-GRADE DYSPLASIA OR MALIGNANCY 3. Rectum, biopsy - ADENOCARCINOMA. SEE NOTE   12/01/2020 Cancer Staging   Cancer Staging Rectal adenocarcinoma [facility]) Staging form: Colon and Rectum, AJCC 8th Edition - Clinical stage from 12/21/2020: Stage IIIB (cT3, cN1, cM0) - Unsigned    12/06/2020 Imaging   CT CAP with contrast IMPRESSION: 1. There is partially circumferential soft tissue thickening of the mid to superior rectum, approximately 5 cm in length and the inferior extent approximately 6 cm above the anal verge, consistent with primary rectal malignancy identified by colonoscopy. 2. There appear to be abnormally enlarged perirectal lymph nodes posteriorly about the superior rectum measuring up to 1.0 x 0.8 cm. Findings are suspicious for perirectal nodal metastatic disease, however rectal MRI is the test of choice for initial local staging of rectal  cancer. 3. There is a 4 mm nonspecific pulmonary nodule of the superior segment left lower lobe, statistically most likely incidental, infectious or inflammatory, although nonspecific and isolated metastatic disease is not strictly excluded. Attention on follow-up. 4. No other evidence of metastatic disease in the chest, abdomen, or pelvis. Aortic Atherosclerosis (ICD10-I70.0).   12/09/2020 Imaging   Local staging MRI pelvis without contrast IMPRESSION: 4.9 cm circumferential mid/lower rectal mass, corresponding to the patient's newly diagnosed rectal cancer. Rectal adenocarcinoma T stage: T3c Rectal adenocarcinoma N stage:  N1 Distance from tumor to the internal anal sphincter is 3.7 cm.   12/21/2020 Initial Diagnosis   Rectal adenocarcinoma (HCC)   01/04/2021 -  Chemotherapy   Concurrent chemoradiation with Xeloda 1000mg  in the AM and 1500mg  in the PM on days of Radiation starting 01/04/21.       01/04/2021 - 02/11/2021 Radiation Therapy   Concurrent chemoradiation with Dr Mitzi Hansen and Xeloda starting 01/04/21.    01/31/2022 Procedure   Colonoscopy, Dr. Lavon Paganini  Findings: - An infiltrative partially obstructing large mass was found in the rectum. The mass was circumferential. The mass measured ten cm in length, extending from 5-15cm from anal verge. In addition, rectal diameter at narrowest approx twelve mm. Oozing was present. Biopsies were taken with a cold forceps for histology.  Impression: - Hemorrhoids found on perianal exam. - Likely malignant partially obstructing tumor in the rectum. Biopsied. - Non-bleeding external and internal hemorrhoids.   01/31/2022 Pathology Results   Diagnosis Rectum, biopsy INVASIVE MODERATELY DIFFERENTIATED ADENOCARCINOMA   05/11/2022 -  Chemotherapy   Patient is on Treatment Plan : COLORECTAL Xelox (Capeox)(130/850) q21d     09/07/2022 Imaging    IMPRESSION: 1. No significant change in circumferential wall thickening of the rectum, in  keeping with known primary rectal mass. Similar appearance of post treatment perirectal and presacral fat stranding and fascial thickening 2. Unchanged nodule of the superior segment left lower lobe with adjacent bandlike scarring. No new nodules. 3. No evidence of lymphadenopathy or other metastatic disease in the chest, abdomen, or pelvis. 4. Large burden of stool throughout the colon. 5. Cardiomegaly.   12/08/2022 Imaging  IMPRESSION: Persistent wall thickening along the rectum with adjacent severe inflammatory stranding and nodularity. Please correlate with exact location of the distribution of the patient's neoplasm.   No discrete new mass lesion, fluid collection or lymph node enlargement.   Improved visualization of the fractures involving the left side of the pubic symphysis as well as probable insufficiency fractures along the sacrum. Associated areas of increasing heterogeneous bony sclerosis along the pubic bones and sacrum. In principle sclerotic bone metastases would be in the differential but are felt to be less likely based on the overall appearance. If needed confirmatory MRI can be considered as clinically appropriate to further delineate.   Stable scarring and fibrotic changes along the lungs with a tiny nodular area along the superior segment of the left lower lobe. Simple continued follow up.   Enlarged heart.   10/09/2023 Imaging   CT chest abdomen and pelvis with contrast  MPRESSION: 1. Persistent circumferential wall thickening in the rectum, not substantially changed taking into account less rectal distention on the current study. 2. Marked stool volume throughout the colon proximal to the rectum, similar to minimally progressive in the interval. While this may reflect clinical constipation, a degree of stricture related to the rectal neoplasm cannot be excluded. 3. No findings on the current study to suggest progressive or metastatic disease. 4.  Heterogeneous mineralization of the sacrum, as before. Nonspecific by CT, features may reflect old trauma.       REVIEW OF SYSTEMS:   Constitutional: Denies fevers, chills or abnormal weight loss Eyes: Denies blurriness of vision Ears, nose, mouth, throat, and face: Denies mucositis or sore throat Respiratory: Denies cough, dyspnea or wheezes Cardiovascular: Denies palpitation, chest discomfort or lower extremity swelling Gastrointestinal:  Denies nausea, heartburn or change in bowel habits Skin: Denies abnormal skin rashes Lymphatics: Denies new lymphadenopathy or easy bruising Neurological:Denies numbness, tingling or new weaknesses Behavioral/Psych: Mood is stable, no new changes  All other systems were reviewed with the patient and are negative.   VITALS:   Today's Vitals   10/16/23 0844  BP: 106/60  Pulse: 66  Resp: 20  Temp: 98.3 F (36.8 C)  TempSrc: Temporal  SpO2: 98%  Weight: 103 lb 4.8 oz (46.9 kg)  Height: 4\' 10"  (1.473 m)  PainSc: 4    Body mass index is 21.59 kg/m.    Wt Readings from Last 3 Encounters:  10/16/23 103 lb 4.8 oz (46.9 kg)  09/25/23 103 lb 14.4 oz (47.1 kg)  09/17/23 100 lb 1.4 oz (45.4 kg)    Body mass index is 21.59 kg/m.  Performance status (ECOG): 1 - Symptomatic but completely ambulatory  PHYSICAL EXAM:   GENERAL:alert, no distress and comfortable SKIN: skin color, texture, turgor are normal, no rashes or significant lesions EYES: normal, Conjunctiva are pink and non-injected, sclera clear OROPHARYNX:no exudate, no erythema and lips, buccal mucosa, and tongue normal  NECK: supple, thyroid normal size, non-tender, without nodularity LYMPH:  no palpable lymphadenopathy in the cervical, axillary or inguinal LUNGS: clear to auscultation and percussion with normal breathing effort HEART: regular rate & rhythm and no murmurs and no lower extremity edema ABDOMEN:abdomen soft, non-tender and normal bowel sounds Musculoskeletal:no  cyanosis of digits and no clubbing  NEURO: alert & oriented x 3 with fluent speech, no focal motor/sensory deficits  LABORATORY DATA:  I have reviewed the data as listed    Component Value Date/Time   NA 140 10/16/2023 0813   NA 139 09/29/2019 1406   K 3.4 (L)  10/16/2023 0813   CL 103 10/16/2023 0813   CO2 31 10/16/2023 0813   GLUCOSE 108 (H) 10/16/2023 0813   BUN 14 10/16/2023 0813   BUN 12 09/29/2019 1406   CREATININE 0.92 10/16/2023 0813   CALCIUM 8.6 (L) 10/16/2023 0813   PROT 6.9 10/16/2023 0813   PROT 6.6 06/22/2016 1004   ALBUMIN 3.6 10/16/2023 0813   ALBUMIN 4.1 06/22/2016 1004   AST 13 (L) 10/16/2023 0813   ALT <5 10/16/2023 0813   ALKPHOS 57 10/16/2023 0813   BILITOT 0.3 10/16/2023 0813   GFRNONAA >60 10/16/2023 0813   GFRAA 88 09/29/2019 1406     Lab Results  Component Value Date   WBC 4.5 10/16/2023   NEUTROABS 2.8 10/16/2023   HGB 9.5 (L) 10/16/2023   HCT 29.3 (L) 10/16/2023   MCV 76.1 (L) 10/16/2023   PLT 217 10/16/2023     RADIOGRAPHIC STUDIES: CT CHEST ABDOMEN PELVIS W CONTRAST Result Date: 10/16/2023 CLINICAL DATA:  Rectal cancer.  Restaging.  * Tracking Code: BO * EXAM: CT CHEST, ABDOMEN, AND PELVIS WITH CONTRAST TECHNIQUE: Multidetector CT imaging of the chest, abdomen and pelvis was performed following the standard protocol during bolus administration of intravenous contrast. RADIATION DOSE REDUCTION: This exam was performed according to the departmental dose-optimization program which includes automated exposure control, adjustment of the mA and/or kV according to patient size and/or use of iterative reconstruction technique. CONTRAST:  OMNIPAQUE IOHEXOL 300 MG/ML  SOLN COMPARISON:  07/16/2023 FINDINGS: CT CHEST FINDINGS Cardiovascular: The heart size is normal. No substantial pericardial effusion. Mild atherosclerotic calcification is noted in the wall of the thoracic aorta. Right Port-A-Cath tip is positioned in the right atrium.  Mediastinum/Nodes: No mediastinal lymphadenopathy. There is no hilar lymphadenopathy. The esophagus has normal imaging features. There is no axillary lymphadenopathy. Lungs/Pleura: Tiny medial left upper lobe calcified granuloma again noted. No new suspicious pulmonary nodule or mass. Left base atelectasis is similar to prior. No focal airspace consolidation. There is no evidence of pleural effusion. Musculoskeletal: No worrisome lytic or sclerotic osseous abnormality. CT ABDOMEN PELVIS FINDINGS Hepatobiliary: No suspicious focal abnormality within the liver parenchyma. There is no evidence for gallstones, gallbladder wall thickening, or pericholecystic fluid. No intrahepatic or extrahepatic biliary dilation. Pancreas: No focal mass lesion. No dilatation of the main duct. No intraparenchymal cyst. No peripancreatic edema. Spleen: No splenomegaly. No suspicious focal mass lesion. Adrenals/Urinary Tract: No adrenal nodule or mass. Kidneys unremarkable. No evidence for hydroureter. The urinary bladder appears normal for the degree of distention. Stomach/Bowel: Stomach is unremarkable. No gastric wall thickening. No evidence of outlet obstruction. Duodenum is normally positioned as is the ligament of Treitz. No small bowel wall thickening. No small bowel dilatation. The terminal ileum is normal. The appendix is best seen on coronal images and is unremarkable. There is circumferential wall thickening in the rectum, likely not substantially changed given the decreased distention of the rectum on the current study. Perirectal edema/inflammation is similar. There is a marked amount of stool in the colon proximal to the rectum, stable to minimally increased in the interval. Vascular/Lymphatic: There is moderate atherosclerotic calcification of the abdominal aorta without aneurysm. There is no gastrohepatic or hepatoduodenal ligament lymphadenopathy. No retroperitoneal or mesenteric lymphadenopathy. No pelvic sidewall  lymphadenopathy. Reproductive: The uterus is unremarkable.  There is no adnexal mass. Other: No intraperitoneal free fluid. Musculoskeletal: Heterogeneous mineralization in the sacrum, stable. Mild anterolisthesis of L5 on S1 is similar to prior. IMPRESSION: 1. Persistent circumferential wall thickening in the rectum,  not substantially changed taking into account less rectal distention on the current study. 2. Marked stool volume throughout the colon proximal to the rectum, similar to minimally progressive in the interval. While this may reflect clinical constipation, a degree of stricture related to the rectal neoplasm cannot be excluded. 3. No findings on the current study to suggest progressive or metastatic disease. 4. Heterogeneous mineralization of the sacrum, as before. Nonspecific by CT, features may reflect old trauma. Electronically Signed   By: Kennith Center M.D.   On: 10/16/2023 08:19   IR IMAGING GUIDED PORT INSERTION Result Date: 09/17/2023 INDICATION: Chemotherapy.  History of rectal cancer. EXAM: IMPLANTED PORT A CATH PLACEMENT WITH ULTRASOUND AND FLUOROSCOPIC GUIDANCE MEDICATIONS: None ANESTHESIA/SEDATION: Moderate (conscious) sedation was employed during this procedure. A total of Versed 2 mg and Fentanyl 50 mcg was administered intravenously. Moderate Sedation Time: 20 minutes. The patient's level of consciousness and vital signs were monitored continuously by radiology nursing throughout the procedure under my direct supervision. FLUOROSCOPY TIME:  Fluoroscopic dose; 0.7 mGy COMPLICATIONS: None immediate. PROCEDURE: The procedure, risks, benefits, and alternatives were explained to the patient and/or patient's representative. Questions regarding the procedure were encouraged and answered. The patient understands and consents to the procedure. The RIGHT neck and chest were prepped with chlorhexidine in a sterile fashion, and a sterile drape was applied covering the operative field. Maximum  barrier sterile technique with sterile gowns and gloves were used for the procedure. A timeout was performed prior to the initiation of the procedure. Local anesthesia was provided with 1% lidocaine with epinephrine. After creating a small venotomy incision, a micropuncture kit was utilized to access the internal jugular vein under direct, real-time ultrasound guidance. Ultrasound image documentation was performed. The microwire was kinked to measure appropriate catheter length. A subcutaneous port pocket was then created along the upper chest wall utilizing a combination of sharp and blunt dissection. The pocket was irrigated with sterile saline. A single lumen power injectable port was chosen for placement. The 8 Fr catheter was tunneled from the port pocket site to the venotomy incision. The port was placed in the pocket. The external catheter was trimmed to appropriate length. At the venotomy, an 8 Fr peel-away sheath was placed over a guidewire under fluoroscopic guidance. The catheter was then placed through the sheath and the sheath was removed. Final catheter positioning was confirmed and documented with a fluoroscopic spot radiograph. The port was accessed with a Huber needle, aspirated and flushed with heparinized saline. The port pocket incision was closed with interrupted 3-0 Vicryl suture then Dermabond was applied, including at the venotomy incision. Dressings were placed. The patient tolerated the procedure well without immediate post procedural complication. IMPRESSION: Successful placement of a RIGHT internal jugular approach power injectable Port-A-Cath. The tip of the catheter is positioned at the superior cavo-atrial junction. The catheter is ready for immediate use. Roanna Banning, MD [facility] Radiology Electronically Signed   By: Roanna Banning M.D.   On: 09/17/2023 14:41   Addendum I have seen the patient, examined her. I agree with the  assessment and and plan and have edited the notes.   I personally reviewed receiving CT scan images, and discussed findings with patient.  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  She is responding to laxatives well and will continue.  I discussed option to change oxaliplatin from every 6 weeks to every 3 weeks,  however she is reluctant and would like to continue every 6 weeks.  We discussed the possibility of diverting ostomy if she has significant cancer progression which causes bowel obstruction.  She voiced understanding.  Will continue current treatment at the same dose.  All questions were answered.  I spent a total of 30 minutes for her visit today, more than 50% time on face-to-face counseling.  Malachy Mood MD 10/16/2023

## 2023-10-16 ENCOUNTER — Inpatient Hospital Stay

## 2023-10-16 ENCOUNTER — Other Ambulatory Visit: Payer: Medicaid Other

## 2023-10-16 ENCOUNTER — Inpatient Hospital Stay: Payer: Medicaid Other

## 2023-10-16 ENCOUNTER — Other Ambulatory Visit (HOSPITAL_COMMUNITY): Payer: Self-pay

## 2023-10-16 ENCOUNTER — Inpatient Hospital Stay: Payer: Medicaid Other | Attending: Physician Assistant | Admitting: Nurse Practitioner

## 2023-10-16 VITALS — BP 106/60 | HR 66 | Temp 98.3°F | Resp 20 | Ht <= 58 in | Wt 103.3 lb

## 2023-10-16 DIAGNOSIS — K5900 Constipation, unspecified: Secondary | ICD-10-CM | POA: Diagnosis not present

## 2023-10-16 DIAGNOSIS — K6289 Other specified diseases of anus and rectum: Secondary | ICD-10-CM

## 2023-10-16 DIAGNOSIS — Z5111 Encounter for antineoplastic chemotherapy: Secondary | ICD-10-CM | POA: Diagnosis present

## 2023-10-16 DIAGNOSIS — C7800 Secondary malignant neoplasm of unspecified lung: Secondary | ICD-10-CM | POA: Insufficient documentation

## 2023-10-16 DIAGNOSIS — D509 Iron deficiency anemia, unspecified: Secondary | ICD-10-CM | POA: Diagnosis not present

## 2023-10-16 DIAGNOSIS — M25511 Pain in right shoulder: Secondary | ICD-10-CM | POA: Diagnosis not present

## 2023-10-16 DIAGNOSIS — E876 Hypokalemia: Secondary | ICD-10-CM | POA: Diagnosis not present

## 2023-10-16 DIAGNOSIS — G620 Drug-induced polyneuropathy: Secondary | ICD-10-CM | POA: Diagnosis not present

## 2023-10-16 DIAGNOSIS — C20 Malignant neoplasm of rectum: Secondary | ICD-10-CM | POA: Diagnosis not present

## 2023-10-16 DIAGNOSIS — Z923 Personal history of irradiation: Secondary | ICD-10-CM | POA: Insufficient documentation

## 2023-10-16 DIAGNOSIS — R630 Anorexia: Secondary | ICD-10-CM | POA: Diagnosis not present

## 2023-10-16 LAB — CBC WITH DIFFERENTIAL (CANCER CENTER ONLY)
Abs Immature Granulocytes: 0.01 10*3/uL (ref 0.00–0.07)
Basophils Absolute: 0 10*3/uL (ref 0.0–0.1)
Basophils Relative: 1 %
Eosinophils Absolute: 0.2 10*3/uL (ref 0.0–0.5)
Eosinophils Relative: 4 %
HCT: 29.3 % — ABNORMAL LOW (ref 36.0–46.0)
Hemoglobin: 9.5 g/dL — ABNORMAL LOW (ref 12.0–15.0)
Immature Granulocytes: 0 %
Lymphocytes Relative: 19 %
Lymphs Abs: 0.9 10*3/uL (ref 0.7–4.0)
MCH: 24.7 pg — ABNORMAL LOW (ref 26.0–34.0)
MCHC: 32.4 g/dL (ref 30.0–36.0)
MCV: 76.1 fL — ABNORMAL LOW (ref 80.0–100.0)
Monocytes Absolute: 0.6 10*3/uL (ref 0.1–1.0)
Monocytes Relative: 14 %
Neutro Abs: 2.8 10*3/uL (ref 1.7–7.7)
Neutrophils Relative %: 62 %
Platelet Count: 217 10*3/uL (ref 150–400)
RBC: 3.85 MIL/uL — ABNORMAL LOW (ref 3.87–5.11)
RDW: 19.5 % — ABNORMAL HIGH (ref 11.5–15.5)
WBC Count: 4.5 10*3/uL (ref 4.0–10.5)
nRBC: 0 % (ref 0.0–0.2)

## 2023-10-16 LAB — CMP (CANCER CENTER ONLY)
ALT: <5 U/L (ref 0–44)
AST: 13 U/L — ABNORMAL LOW (ref 15–41)
Albumin: 3.6 g/dL (ref 3.5–5.0)
Alkaline Phosphatase: 57 U/L (ref 38–126)
Anion gap: 6 (ref 5–15)
BUN: 14 mg/dL (ref 8–23)
CO2: 31 mmol/L (ref 22–32)
Calcium: 8.6 mg/dL — ABNORMAL LOW (ref 8.9–10.3)
Chloride: 103 mmol/L (ref 98–111)
Creatinine: 0.92 mg/dL (ref 0.44–1.00)
GFR, Estimated: >60 mL/min (ref >60)
Glucose, Bld: 108 mg/dL — ABNORMAL HIGH (ref 70–99)
Potassium: 3.4 mmol/L — ABNORMAL LOW (ref 3.5–5.1)
Sodium: 140 mmol/L (ref 135–145)
Total Bilirubin: 0.3 mg/dL (ref 0.0–1.2)
Total Protein: 6.9 g/dL (ref 6.5–8.1)

## 2023-10-16 MED ORDER — DEXTROSE 5 % IV SOLN
Freq: Once | INTRAVENOUS | Status: AC
Start: 2023-10-16 — End: 2023-10-16

## 2023-10-16 MED ORDER — PALONOSETRON HCL INJECTION 0.25 MG/5ML
0.2500 mg | Freq: Once | INTRAVENOUS | Status: AC
  Administered 2023-10-16: 0.25 mg via INTRAVENOUS
  Filled 2023-10-16: qty 5

## 2023-10-16 MED ORDER — OXALIPLATIN CHEMO INJECTION 100 MG/20ML
70.0000 mg/m2 | Freq: Once | INTRAVENOUS | Status: AC
  Administered 2023-10-16: 100 mg via INTRAVENOUS
  Filled 2023-10-16: qty 20

## 2023-10-16 MED ORDER — SODIUM CHLORIDE 0.9% FLUSH
10.0000 mL | INTRAVENOUS | Status: DC | PRN
  Administered 2023-10-16: 10 mL

## 2023-10-16 MED ORDER — SODIUM CHLORIDE 0.9 % IV SOLN
7.5000 mg/kg | Freq: Once | INTRAVENOUS | Status: AC
  Administered 2023-10-16: 350 mg via INTRAVENOUS
  Filled 2023-10-16: qty 14

## 2023-10-16 MED ORDER — TRAMADOL HCL 50 MG PO TABS
50.0000 mg | ORAL_TABLET | Freq: Four times a day (QID) | ORAL | 0 refills | Status: DC | PRN
  Filled 2023-10-16: qty 90, 23d supply, fill #0

## 2023-10-16 MED ORDER — SODIUM CHLORIDE 0.9 % IV SOLN: Freq: Once | INTRAVENOUS | Status: AC

## 2023-10-16 MED ORDER — FAMOTIDINE IN NACL 20-0.9 MG/50ML-% IV SOLN
20.0000 mg | Freq: Once | INTRAVENOUS | Status: AC
  Administered 2023-10-16: 20 mg via INTRAVENOUS
  Filled 2023-10-16: qty 50

## 2023-10-16 MED ORDER — HEPARIN SOD (PORK) LOCK FLUSH 100 UNIT/ML IV SOLN
500.0000 [IU] | Freq: Once | INTRAVENOUS | Status: AC | PRN
Start: 2023-10-16 — End: 2023-10-16
  Administered 2023-10-16: 500 [IU]

## 2023-10-16 MED ORDER — DEXAMETHASONE SODIUM PHOSPHATE 10 MG/ML IJ SOLN
10.0000 mg | Freq: Once | INTRAMUSCULAR | Status: AC
  Administered 2023-10-16: 10 mg via INTRAVENOUS
  Filled 2023-10-16: qty 1

## 2023-10-16 MED ORDER — LIDOCAINE-PRILOCAINE 2.5-2.5 % EX CREA
1.0000 | TOPICAL_CREAM | Freq: Every day | CUTANEOUS | 3 refills | Status: AC | PRN
  Filled 2023-10-16: qty 30, 1d supply, fill #0

## 2023-10-16 MED ORDER — POTASSIUM CHLORIDE CRYS ER 10 MEQ PO TBCR
10.0000 meq | EXTENDED_RELEASE_TABLET | Freq: Every day | ORAL | 2 refills | Status: DC
  Filled 2023-10-16: qty 30, 30d supply, fill #0

## 2023-10-16 MED ORDER — DIPHENHYDRAMINE HCL 50 MG/ML IJ SOLN
25.0000 mg | Freq: Once | INTRAMUSCULAR | Status: AC
  Administered 2023-10-16: 25 mg via INTRAVENOUS
  Filled 2023-10-16: qty 1

## 2023-10-16 NOTE — Congregational Nurse Program (Signed)
 Accompanied patient to appointment at [facility].  She voiced no complaints.  Chose to continue IV chemo treatments every 6 weeks.  She was prescribed Emla cream for port site to use 1-2 hours prior to treatment appointments.  She was also prescribed potassium and a new prescription for tramadol.  CN will pick up prescriptions and also pickup more senokot for patient.  [name] RN, Congregational Nurse [phone].

## 2023-10-16 NOTE — Patient Instructions (Addendum)
 [facility] - A DEPT OF [facility]  Discharge Instructions: Thank you for choosing [facility] to provide your oncology and hematology care.   If you have a lab appointment with the Cancer Center, please go directly to the Cancer Center and check in at the registration area.   Wear comfortable clothing and clothing appropriate for easy access to any Portacath or PICC line.   We strive to give you quality time with your provider. You may need to reschedule your appointment if you arrive late (15 or more minutes).  Arriving late affects you and other patients whose appointments are after yours.  Also, if you miss three or more appointments without notifying the office, you may be dismissed from the clinic at the provider's discretion.      For prescription refill requests, have your pharmacy contact our office and allow 72 hours for refills to be completed.    Today you received the following chemotherapy and/or immunotherapy agents: Bevacizumab and Oxaliplatin      To help prevent nausea and vomiting after your treatment, we encourage you to take your nausea medication as directed.  BELOW ARE SYMPTOMS THAT SHOULD BE REPORTED IMMEDIATELY: *FEVER GREATER THAN 100.4 F (38 C) OR HIGHER *CHILLS OR SWEATING *NAUSEA AND VOMITING THAT IS NOT CONTROLLED WITH YOUR NAUSEA MEDICATION *UNUSUAL SHORTNESS OF BREATH *UNUSUAL BRUISING OR BLEEDING *URINARY PROBLEMS (pain or burning when urinating, or frequent urination) *BOWEL PROBLEMS (unusual diarrhea, constipation, pain near the anus) TENDERNESS IN MOUTH AND THROAT WITH OR WITHOUT PRESENCE OF ULCERS (sore throat, sores in mouth, or a toothache) UNUSUAL RASH, SWELLING OR PAIN  UNUSUAL VAGINAL DISCHARGE OR ITCHING   Items with * indicate a potential emergency and should be followed up as soon as possible or go to the Emergency Department if any problems should occur.  Please show the CHEMOTHERAPY ALERT CARD  or IMMUNOTHERAPY ALERT CARD at check-in to the Emergency Department and triage nurse.  Should you have questions after your visit or need to cancel or reschedule your appointment, please contact [facility] - A DEPT OF [facility]  Dept: [phone]  and follow the prompts.  Office hours are 8:00 a.m. to 4:30 p.m. Monday - Friday. Please note that voicemails left after 4:00 p.m. may not be returned until the following business day.  We are closed weekends and major holidays. You have access to a nurse at all times for urgent questions. Please call the main number to the clinic Dept: [phone] and follow the prompts.   For any non-urgent questions, you may also contact your provider using MyChart. We now offer e-Visits for anyone 59 and older to request care online for non-urgent symptoms. For details visit [email or web address].   Also download the MyChart app! Go to the app store, search "MyChart", open the app, select [place], and log in with your MyChart username and password.  Bevacizumab Injection ?y l thu?c g? BEVACIZUMAB (??c l be va SIZ yoo mab) ?i?u tr? m?t s? lo?i ung th?. Thu?c ny tc d?ng b?ng cch ng?n ch?n m?t lo?i protein khi?n t? bo ung th? pht tri?n v nhn ln. ?i?u ny gip lm ch?m ho?c ng?n ch?n s? ly lan c?a cc t? bo ung th?. ?y l khng th? ??n dng. Thu?c ny c th? ???c dng cho nh?ng m?c ?ch khc; hy h?i ng??i cung c?p d?ch v? y t? ho?c d??c s? c?a mnh,  n?u qu v? c th?c m?c. (CC) NHN HI?U PH? BI?N: Alymsys, Avastin, MVASI, Vegzalma, Zirabev Ti c?n ph?i bo cho ng??i cung c?p d?ch v? y t? c?a mnh ?i?u g tr??c khi dng thu?c ny? H? c?n bi?t li?u qu v? hi?n c b?t k? tnh tr?ng no sau ?y hay khng: C?c mu ?ng Ho ra mu ?ang gi?i ph?u ho?c gi?i ph?u m?i ?y Suy tim Huy?t a?p cao Ti?n s? c ch? thng gi?a 2 ho?c nhi?u b? ph?n c? th? th??ng khng thng nhau (l? r) Ti?n s? rch d? dy ho?c ru?t C protein  trong n??c ti?u c?a qu v? Pha?n ??ng b?t th???ng ho??c ph?n ?ng di? ??ng v??i bevacizumab, ca?c d??c ph?m kha?c, th?c ph?m, thu?c nhu?mu, ho??c ch?t ba?o qua?n ?ang c thai ho??c ??nh co? thai ?ang cho con bu? Ti nn s? d?ng thu?c ny nh? th? no? Thu?c ny ?? tim vo t?nh m?ch. Thu?c ny ???c ??i ng? ch?m [place] c?a qu v? cho dng t?i b?nh vi?n ho?c phng m?ch. Hy h?i ??i ng? ch?m [place] c?a qu v? v? vi?c dng thu?c ny ? tr? em. C th? c?n ch?m [place] ??c bi?t. Qu li?u: N?u qu v? cho r?ng mnh ? dng qu nhi?u thu?c ny, th hy lin l?c v?i trung tm ki?m sot ch?t ??c ho?c phng c?p c?u ngay l?p t?c.<br />L?U : Thu?c ny ch? dnh ring cho qu v?. Khng chia s? thu?c ny v?i nh?ng ng??i khc. N?u ti l? qun m?t li?u th sao? Hy ??n cc cu?c h?n khm ?? nh?n cc li?u thu?c ti?p theo. ?i?u quan tr?ng l khng nn b? l? li?u thu?c c?a qu v?. Hy g?i cho ??i ng? ch?m [place] c?a mnh n?u qu v? khng th? ??n m?t cu?c h?n khm. Nh?ng g c th? t??ng tc v?i thu?c ny? Cc t??ng tc v?i thu?c khng x?y ra. Danh sch ny c th? khng m t? ?? h?t cc t??ng tc c th? x?y ra. Hy ??a cho ng??i cung c?p d?ch v? y t? c?a mnh danh sch t?t c? cc thu?c, th?o d??c, cc thu?c khng c?n toa, ho?c cc ch? ph?m b? sung m qu v? dng. C?ng nn bo cho h? bi?t r?ng qu v? c ht thu?c, u?ng r??u, ho?c c s? d?ng ma ty tri php hay khng. Vi th? c th? t??ng tc v?i thu?c c?a qu v?. Ti c?n ph?i theo di ?i?u g trong khi dng thu?c ny? Tnh tr?ng c?a qu v? se? ????c theo do?i c?n th?n trong khi qu v? ?ang du?ng thu?c na?y. Qu v? c th? c?n ?i lm xt nghi?m mu trong khi ?ang dng thu?c ny. Thu?c ny c th? lm cho qu v? c?m th?y khng ???c kh?e nh? th??ng l?. ?i?u ny khng ph?i l hi?m g?p, v thu?c ha tr? li?u c th? tc ??ng ln t? bo lnh l?n t? bo ung th?. Hy t??ng trnh m?i tc d?ng ph?. Hy ti?p t?c ??t ?i?u tr? c?a mnh ngay c? khi qu v? c?m th?y m?t, tr? khi ??i ng? ch?m [place] yu  c?u qu v? ng?ng ?i?u tr?. Thu?c ny c th? lm t?ng nguy c? b? b?m tm ho?c ch?y mu. Hy g?i cho ??i ng? ch?m [place], n?u qu v? ??  th?y b?t k? tnh tr?ng ch?y mu b?t th??ng no. Tr??c khi gi?i ph?u, hy bn b?c v?i ??i ng? ch?m [place] c?a qu v? ?? ??m b?o c th? ti?n hnh gi?i ph?u. Thu?c ny c th? lm t?ng nguy  c? v?t th??ng ho?c v?t m? ch?m lnh. Qu v? s? c?n ng?ng dng thu?c ny 28 ngy tr??c khi gi?i ph?u. Sau khi gi?i ph?u, hy ??i t nh?t 28 ngy tr??c khi b?t ??u dng l?i thu?c ny. ??m b?o v?t m? ho?c v?t th??ng ? ?? lnh tr??c khi b?t ??u dng l?i thu?c ny. Hy h?i ??i ng? ch?m [place] c?a mnh, n?u c th?c m?c. Hy h?i ??i ng? ch?m [place] c?a mnh, n?u qu v? c [place]. Thu?c ny c th? gy d? t?t b?m sinh nghim tr?ng n?u dng trong [place] k? v trong 6 thng sau li?u thu?c cu?i cng. Nn trnh [place] trong khi dng thu?c ny v trong 6 thng sau li?u thu?c cu?i cng. ??i ng? ch?m [place] c th? gip qu v? tm ra l?a ch?n ph h?p v?i qu v?. Khng ???c cho tr? b m? trong khi dng thu?c ny v trong vng 6 thng sau khi dng li?u thu?c cu?i cng. Thu?c ny c th? gy v sinh. Hy th?o lu?n v?i ??i ng? ch?m [place] c?a mnh, n?u qu v? lo l??ng v? kha? n?ng sinh s?n c?a mnh. Ti c th? nh?n th?y nh?ng tc d?ng ph? no khi dng thu?c ny? Nh?ng tc d?ng ph? qu v? c?n ph?i bo cho ??i ng? ch?m [place] cng s?m cng t?t: Cc ph?n ?ng d? ?ng -- da b? n?i ban, ng?a, n?i my ?ay, s?ng ? m?t, mi, l??i, ho?c h?ng Xu?t huy?t -- phn c mu ho?c mu ?en, mu h?c n, i ra mu ho?c ch?t mu nu gi?ng b c ph, n??c ti?u mu ?? ho?c nu s?m, cc ??m nh? mu ?? ho?c tm trn da, b?m tm ho?c ch?y mu b?t th??ng C?c mu ?ng -- ?au, s?ng ho?c nng ? chn, kh th?, ?au ng?c Nh?i mu c? tim -- ?au ho?c t?c ? ng?c, vai, cnh tay ho?c hm, bu?n i, kh th?, da l?nh ho?c ?m ??t, c?m th?y y?u ?t ho?c chong vng Suy tim -- kh th?, s?ng m?t c chn, bn chn ho?c bn tay, t?ng cn ??t ng?t, y?u ?t ho?c m?t m?i khc th??ng T?ng  huy?t p Nhi?m trng -- s?t, ?n l?nh, ho, ?au h?ng, v?t th??ng khng lnh, ?au khi ?i ti?u ho?c kh ?i ti?u, c?m gic kh ch?u chung ho?c c?m th?y khng kh?e Ph?n ?ng truy?n d?ch -- ?au ng?c, kh th? ho?c kh th?, c?m th?y y?u ?t ho?c chong vng T?n th??ng th?n -- gi?m l??ng n??c ti?u, s?ng ? m?t c chn, bn tay ho?c bn chn ?au bao t? d? d?i, khng h?t ho?c tr? nn t?i t? h?n ??t qu? -- ??t ng?t b? t ho?c y?u ? m?t, cnh tay ho?c chn, ni kh, l l?n, kh ?i l?i, m?t th?ng b?ng ho?c ph?i h?p ??ng tc, chng m?t, ?au ??u d? d?i, thay ??i th? l?c ?au ??u ??t ng?t v d? d?i, l l?n, thay ??i th? l?c, co gi?t, c th? l d?u hi?u c?a h?i ch?ng b?nh no sau c h?i ph?c (PRES) Cc tc d?ng ph? th??ng khng c?n ph?i ch?m [place] y t? (hy bo cho ??i ng? ch?m [place] n?u cc tc d?ng ph? ny ti?p di?n ho?c gy phi?n toi): ?au l?ng Thay ??i v? gic Tiu ch?y Da kh Ch?y nhi?u n??c m?t Ch?y mu cam Danh sch ny c th? khng m t? ?? h?t cc tc d?ng ph? c th? x?y ra. Xin g?i t?i bc s? c?a mnh ?? ???c c? v?n chuyn mn v? cc  tc d?ng ph?Ladell Heads v? c th? t??ng trnh cc tc d?ng ph? cho FDA theo s? [phone]. Ti nn c?t gi? thu?c c?a mnh ? ?u? Thu?c ny th??ng ???c cho dng t?i b?nh vi?n ho?c phng m?ch. Thu?c ny khng ???c c?t gi? t?i nh. L?U : ?y l b?n tm t?t. N c th? khng bao hm t?t c? thng tin c th? c. N?u qu v? th?c m?c v? thu?c ny, xin trao ??i v?i bc s?, d??c s?, ho?c ng??i cung c?p d?ch v? y t? c?a mnh.  2024 Elsevier/Gold Standard (2022-05-24 00:00:00)  Oxaliplatin Injection ?y l thu?c g? OXALIPLATIN (??c l ox AL i PLA tin) ?i?u tr? ung th? ??i tr?c trng. Thu?c ny tc d?ng b?ng cch lm ch?m s? pht tri?n c?a cc t? bo ung th?. Thu?c ny c th? ???c dng cho nh?ng m?c ?ch khc; hy h?i ng??i cung c?p d?ch v? y t? ho?c d??c s? c?a mnh, n?u qu v? c th?c m?c. (CC) NHN HI?U PH? BI?N: [place] Ti c?n ph?i bo cho ng??i cung c?p d?ch v? y t? c?a mnh ?i?u g  tr??c khi dng thu?c ny? H? c?n bi?t li?u qu v? hi?n c b?t k? tnh tr?ng no sau ?y hay khng: B?nh tim Ti?n s? nh?p tim ho?c tim ??p khng ??u B?nh gan C s? l??ng t? bo mu th?p (s? l??ng b?ch c?u, h?ng c?u ho?c ti?u c?u) B?nh ph?i ho??c h h?p, ch??ng ha?n nh? hen suy?n Dng cc thu?c ?? ?i?u tr? ho?c phng ng?a c?c mu ?ng C?m gic ?au nhi ? ngn tay, ngn chn ho?c cc r?i lo?n th?n kinh khc Pha?n ??ng b?t th???ng ho??c ph?n ?ng di? ??ng v??i oxaliplatin, ca?c lo?i thu?c kha?c, th?c ph?m, thu?c nhu?m, ho??c ch?t ba?o qua?n N?u qu v? ho?c b?n tnh c?a qu v? ?ang c thai ho?c c k? ho?ch mang thai ?ang cho con bu? Ti nn s? d?ng thu?c ny nh? th? no? Thu?c ny ?? tim vo t?nh m?ch. Thu?c ny ???c ??i ng? ch?m [place] c?a qu v? cho dng t?i b?nh vi?n ho?c phng m?ch. Hy bn v?i ??i ng? ch?m [place] c?a qu v? v? vi?c dng thu?c ny ? tr? em. C th? c?n ch?m [place] ??c bi?t. Qu li?u: N?u qu v? cho r?ng mnh ? dng qu nhi?u thu?c ny, th hy lin l?c v?i trung tm ki?m sot ch?t ??c ho?c phng c?p c?u ngay l?p t?c.<br />L?U : Thu?c ny ch? dnh ring cho qu v?. Khng chia s? thu?c ny v?i nh?ng ng??i khc. N?u ti l? qun m?t li?u th sao? Hy ??n cc cu?c h?n khm ?? nh?n cc li?u thu?c ti?p theo. ?i?u quan tr?ng l khng nn b? l? li?u thu?c no. Hy g?i cho ??i ng? ch?m [place] c?a mnh n?u qu v? khng th? ??n m?t cu?c h?n khm. Nh?ng g c th? t??ng tc v?i thu?c ny? Khng ???c dng thu?c ny cng v?i b?t k? th? no sau ?y: Cisapride Dronedarone Pimozide Thioridazine Thu?c ny c?ng c th? t??ng tc v?i cc thu?c sau ?y: Aspirin v cc thu?c gi?ng aspirin M?t s? thu?c ?i?u tr? ho?c phng ng?a c?c mu ?ng, ch?ng h?n nh? warfarin, apixaban, dabigatran, v rivaroxaban Cisplatin Cyclosporine Cc thu?c l?i ti?u Cc thu?c dng ?? tr? b?nh nhi?m trng, ch?ng h?n nh? acyclovir, adefovir, amphotericin B, bacitracin, cidofovir, foscarnet, ganciclovir, gentamicin,  pentamidine, vancomycin Cc thu?c khng vim khng steroid (NSAID), dng ?? ?i?u tr? ?au v vim, ch?ng h?n nh? ibuprofen ho?c naproxen Cc  thu?c khc lm thay ??i nh?p tim Pamidronate Acid zoledronic Danh sch ny c th? khng m t? ?? h?t cc t??ng tc c th? x?y ra. Hy ??a cho ng??i cung c?p d?ch v? y t? c?a mnh danh sch t?t c? cc thu?c, th?o d??c, cc thu?c khng c?n toa, ho?c cc ch? ph?m b? sung m qu v? dng. C?ng nn bo cho h? bi?t r?ng qu v? c ht thu?c, u?ng r??u, ho?c c s? d?ng ma ty tri php hay khng. Vi th? c th? t??ng tc v?i thu?c c?a qu v?. Ti c?n ph?i theo di ?i?u g trong khi dng thu?c ny? Tnh tr?ng c?a qu v? se? ????c theo do?i c?n th?n trong khi qu v? ?ang du?ng thu?c na?y. Qu v? c th? c?n ?i lm xt nghi?m mu trong khi ?ang dng thu?c ny. Thu?c ny c th? lm cho qu v? c?m th?y khng ???c kh?e nh? th??ng l?. ?i?u ny khng ph?i l hi?m g?p, v thu?c ha tr? li?u c th? tc ??ng ln t? bo lnh l?n t? bo ung th?. Hy t??ng trnh m?i tc d?ng ph?. Hy ti?p t?c ??t ?i?u tr? c?a mnh ngay c? khi qu v? c?m th?y m?t, tr? khi ??i ng? ch?m [place] yu c?u qu v? ng?ng ?i?u tr?. Thu?c ny c th? lm t?ng nguy c? b? nhi?m trng. Hy g?i cho ??i ng? ch?m [place] c?a mnh, n?u qu v? b? s?t, ?n l?nh, ?au h?ng, ho?c c cc tri?u ch?ng khc c?a c?m l?nh hay cm. Khng ???c t? ?i?u tr? cho mnh. Hy c? trnh ? g?n nh?ng ng??i b? b?nh. Trnh dng cc thu?c c ch?a aspirin, acetaminophen, ibuprofen, naproxen ho?c ketoprofen, tr? khi ???c ??i ng? ch?m [place] c?a mnh ch? d?n. Cc thu?c ny c th? che l?p tri?u ch?ng s?t. Hy c?n th?n khi ?nh r?ng ho?c x?a r?ng b?ng ch? nha khoa hay b?ng t?m x?a r?ng, v qu v? c th? d? b? nhi?m trng ho?c d? b? ch?y mu h?n. N?u qu v? c ?i lm r?ng, th hy bo v?i nha s? r?ng qu v? ?ang dng thu?c ny. Thu?c ny c th? khi?n cho qu v? nh?y c?m h?n v?i l?nh. Khng ???c u?ng ?? u?ng l?nh ho?c n??c ?. Hy che kn cc vng da h? tr??c khi  ti?p xc v?i nhi?t ?? l?nh ho?c cc v?t l?nh. Khi ?i ra ngoi tr?i l?nh, hy m?c qu?n o ?m v che mi?ng v m?i ?? lm ?m khng kh ht vo ph?i. Hy bo cho ??i ng? ch?m [place] n?u qu v? b? nh?y c?m v?i l?nh. Hy bo cho ??i ng? ch?m [place], n?u qu v? ho?c b?n tnh c?a qu v? c thai ho?c ngh? r?ng mnh c th? ?ang c [place]. Thu?c ny c th? gy d? t?t b?m sinh nghim tr?ng n?u dng trong [place] k? v trong 9 thng sau li?u thu?c cu?i cng. C?n ph?i c k?t qu? th? [place] m tnh tr??c khi b?t ??u dng thu?c ny. Nn s? d?ng m?t bi?n php trnh [place] ?ng tin c?y trong khi dng thu?c ny v trong 9 thng sau li?u thu?c cu?i cng. Hy bn v?i ??i ng? ch?m  c?a qu v? v? cc bi?n php ng?a thai hi?u qu?Imagene Sheller ???c gy thu? thai trong khi du?ng thu?c na?y va? trong vng 6 tha?ng sau li?u thu?c cu?i cng. S? d?ng bao cao su khi quan h? tnh d?c trong kho?ng th?i gian ny. Khng ???c cho tr? b m? trong khi dng thu?c  ny v trong vng 3 thng sau khi dng li?u thu?c cu?i cng. Thu?c ny c th? gy v sinh. Hy th?o lu?n v?i ??i ng? ch?m [place] c?a mnh, n?u qu v? lo l??ng v? kha? n?ng sinh s?n c?a mnh. Ti c th? nh?n th?y nh?ng tc d?ng ph? no khi dng thu?c ny? Nh?ng tc d?ng ph? qu v? c?n ph?i bo cho ??i ng? ch?m [place] cng s?m cng t?t: Cc ph?n ?ng d? ?ng -- da b? n?i ban, ng?a, n?i my ?ay, s?ng ? m?t, mi, l??i, ho?c h?ng Xu?t huy?t -- phn c mu ho?c mu ?en, mu h?c n, i ra mu ho?c ch?t mu nu gi?ng b c ph, n??c ti?u mu ?? ho?c nu s?m, cc ??m nh? mu ?? ho?c tm trn da, b?m tm ho?c ch?y mu b?t th??ng [place] khan, kh th? ho?c h h?p b? tr? ng?i Cc thay ??i v? nh?p tim -- nh?p tim nhanh ho?c khng ??u, chng m?t, c?m th?y y?u ?t ho?c chong vng, ?au ng?c, kh th? Nhi?m trng -- s?t, ?n l?nh, ho, ?au h?ng, v?t th??ng khng lnh, ?au khi ?i ti?u ho?c kh ?i ti?u, c?m gic kh ch?u chung ho?c c?m th?y khng kh?e T?n th??ng gan -- ?au b?ng trn bn ph?i, chn ?n, bu?n i, phn b?c mu, n??c ti?u  vng s?m ho?c nu, vng da ho?c m?t, y?u ?t ho?c m?t m?i khc th??ng S? l??ng t? bo h?ng c?u th?p -- y?u ?t ho?c m?t m?i khc th??ng, chng m?t, ?au ??u, kh th? T?n th??ng c? b?p -- m?t m?i ho?c y?u ?t khc th??ng, ?au c? b?p, n??c ti?u mu vng s?m ho?c nu, gi?m l??ng n??c ti?u ?au, c c?m gic nh? b? ki?n b, ho?c t ? bn tay ho?c bn chn ?au ??u ??t ng?t v d? d?i, l l?n, thay ??i th? l?c, co gi?t, c th? l d?u hi?u c?a h?i ch?ng b?nh no sau c h?i ph?c (PRES) Xu?t huy?t ho?c b?m tm b?t th??ng Cc tc d?ng ph? th??ng khng c?n ph?i ch?m [place] y t? (hy bo cho ??i ng? ch?m  n?u cc tc d?ng ph? ny ti?p di?n ho?c gy phi?n toi): Tiu ch?y Bu?n i ?au, ?? ho?c s?ng km v?i v?t lot bn trong mi?ng ho?c h?ng Y?u ?t ho?c m?t m?i khc th??ng i m?a Danh sch ny c th? khng m t? ?? h?t cc tc d?ng ph? c th? x?y ra. Xin g?i t?i bc s? c?a mnh ?? ???c c? v?n chuyn mn v? cc tc d?ng ph?Ladell Heads v? c th? t??ng trnh cc tc d?ng ph? cho FDA theo s? [phone]. Ti nn c?t gi? thu?c c?a mnh ? ?u? Thu?c ny th??ng ???c cho dng t?i b?nh vi?n ho?c phng m?ch. Thu?c ny khng ???c c?t gi? t?i nh. L?U : ?y l b?n tm t?t. N c th? khng bao hm t?t c? thng tin c th? c. N?u qu v? th?c m?c v? thu?c ny, xin trao ??i v?i bc s?, d??c s?, ho?c ng??i cung c?p d?ch v? y t? c?a mnh.  2024 [employer]/Gold Standard (2022-08-08 00:00:00)

## 2023-10-17 ENCOUNTER — Other Ambulatory Visit: Payer: Self-pay

## 2023-10-17 NOTE — Addendum Note (Signed)
 Encounter addended by: Edward Qualia on: 10/17/2023 10:48 AM  Actions taken: Imaging Exam ended

## 2023-10-17 NOTE — Congregational Nurse Program (Signed)
 Home visit with interpreter [name].  Delivered a bottle of Senna laxative to patient as recommended by Dr. Mosetta Putt.  Will pick up prescription medications and take them to her on Monday 10/22/2023.  Patient states she is feeling well following chemo yesterday. [name] RN, Congregational Nurse [phone]

## 2023-10-19 ENCOUNTER — Other Ambulatory Visit: Payer: Self-pay

## 2023-10-22 ENCOUNTER — Other Ambulatory Visit (HOSPITAL_COMMUNITY): Payer: Self-pay

## 2023-10-22 ENCOUNTER — Other Ambulatory Visit: Payer: Self-pay | Admitting: Student

## 2023-10-22 DIAGNOSIS — R202 Paresthesia of skin: Secondary | ICD-10-CM

## 2023-10-22 MED ORDER — GABAPENTIN 100 MG PO CAPS
100.0000 mg | ORAL_CAPSULE | Freq: Three times a day (TID) | ORAL | 2 refills | Status: DC | PRN
  Filled 2023-10-22: qty 90, 30d supply, fill #0
  Filled 2023-11-29: qty 90, 30d supply, fill #1
  Filled 2024-01-07: qty 90, 30d supply, fill #0

## 2023-10-22 NOTE — Congregational Nurse Program (Signed)
 Home visit to deliver the following meds.to patient:  picked up from [facility] pharmacy;  Potassium, tramadol, EMLA cream,and xeloda,  Picked up from [facility] pharmacy: lisinopril/hydrochlorothiazide, gabapentin, mirtazapine, xarelto and amlodipine.  Reviewed instructions and purpose of each and asked patient to write instructions in her language on bottles.  She expressed understanding. Reminded her to take all medications as directed and to start xeloda tomorrow 10/23/2023 and take it two tablets twice daily until gone.  She voiced understanding.  [name] RN, Congregational Nurse [phone]

## 2023-11-04 NOTE — Assessment & Plan Note (Addendum)
 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 -Despite strong recommendation, patient firmly and repeatedly declined IV chemo and surgery. She agreed to chemoRT with Xeloda , and received the treatment 01/04/21 - 02/14/21. -she developed local cancer progression and lung mets in 11/2021 -she started Xeloda  on 02/16/2022, low dose oxaliplatin  was added on 05/11/22 (with cycle 4 of Xeloda )  -chemo held since 07/2022 due to leg pain and recurrent nausea  -restaging CT 09/07/2022 showed stable disease in rectum and lung, no new lesions.   -she had chemo break from Feb-May 2024 -restart CAPOX with dose reduction 12/18/2022, not tolerating very well overall.  Oxaliplatin  was held on August 5. She is on oxaliplatin  every 6 weeks now.  -Restaging CT scan from 07/16/2023 showed stable rectal wall thickening, no other  evidence of metastasis  -10/09/2023 - restaging CT CAP showed overall stable disease. Concern for rectal tumor causing obstruction. Patient advised to contact cancer center if she goes more than 3 to 4 days without bowel movement. May need surgical referral for ostomy if obstruction does occur as she has already had radiation to the mass. She did voice understanding. Today, she reports improvement in bowel movements, though continues to have rectal pain with them. Having them nearly every day with soft stools.  --offered to increase oxaliplatin  to every 3 weeks. She prefers to keep this at every 6 weeks.  --continue with bevacizumab  every 3 weeks and oxaliplatin  every 6 weeks.  --continue xeloda  1000 mg twice daily for 14 days on and 7 days off.

## 2023-11-04 NOTE — Progress Notes (Signed)
 Patient Care Team: Sheree Dieter, MD as PCP - General Berna Breslow, RN (Inactive) as Oncology Nurse Navigator Burton, [name] K, NP as Nurse Practitioner (Nurse Practitioner) Sonja Queens, MD as Consulting Physician (Hematology and Oncology) Johna Myers, MD as Consulting Physician (Radiation Oncology) [name], Albino Alu., MD as Consulting Physician (Gastroenterology) Prudy Brownie, DO as Consulting Physician (Pulmonary Disease) Candyce Champagne, MD as Consulting Physician (General Surgery)  Clinic Day:  11/05/2023  Referring physician: Sonja Crompond, MD  ASSESSMENT & PLAN:   Assessment & Plan: 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 -Despite strong recommendation, patient firmly and repeatedly declined IV chemo and surgery. She agreed to chemoRT with Xeloda , and received the treatment 01/04/21 - 02/14/21. -she developed local cancer progression and lung mets in 11/2021 -she started Xeloda  on 02/16/2022, low dose oxaliplatin  was added on 05/11/22 (with cycle 4 of Xeloda )  -chemo held since 07/2022 due to leg pain and recurrent nausea  -restaging CT 09/07/2022 showed stable disease in rectum and lung, no new lesions.   -she had chemo break from Feb-May 2024 -restart CAPOX with dose reduction 12/18/2022, not tolerating very well overall.  Oxaliplatin  was held on August 5. She is on oxaliplatin  every 6 weeks now.  -Restaging CT scan from 07/16/2023 showed stable rectal wall thickening, no other  evidence of metastasis  -10/09/2023 - restaging CT CAP showed overall stable disease. Concern for rectal tumor causing obstruction. Patient advised to contact cancer center if she goes more than 3 to 4 days without bowel movement. May need surgical referral for ostomy if obstruction does occur as she has already  had radiation to the mass. She did voice understanding. Today, she reports improvement in bowel movements, though continues to have rectal pain with them. Having them nearly every day with soft stools.  --offered to increase oxaliplatin  to every 3 weeks. She prefers to keep this at every 6 weeks.  --continue with bevacizumab  every 3 weeks and oxaliplatin  every 6 weeks.  --continue xeloda  1000 mg twice daily for 14 days on and 7 days off.   Rectal pain Associated with rectal tumor and possible constipation.  Recommend more frequent use of tramadol  as needed for more severe pain as this has been effective.  Encouraged daily use of miralax to prevent constipation and bowel obstruction.  Tylenol  can continue to be used for less severe pain when needed. Again, offered to increase oxaliplatin  to every 3 weeks. She prefers to keep this at every 6 weeks since tumor has not become worse (though has not improved much).  Also discussed referral to colorectal surgeon, which may become necessary if bowel obstruction occurs. After lengthy discussion, she prefers to hold off on this referral right now.   Right shoulder pain Description is consistent with musculoskeletal injury or nerve impingement.  Congregational nurse, present with the patient during the visit, will make appointment with primary care for further evaluation.   Decreased appetite due to chemotherapy Currently on mirtazapine . Reviewed directions to take every night. This is not "as needed" medication and will work better if she takes this consistently. She voiced understanding.   Hypokalemia  K is 3.2 today despite taking KCL daily. Increase dosing to BID. Recheck at follow up and adjust treatment as indicated.   Plan:  Interpretor and congregational nurse present during the visit.  Reviewed labs  -mild and stable  anemia. Ferritin level pending at time of visit. Treat iron deficiency as indicated.  -increase potassium supplement to twice  daily.  -CEA level pending. Continue Xeloda . Again, reviewed proper administration instructions. She should take 1000 mg twice daily for 14 days followed by 7 days without Xeloda . The congregational nurse, here with the patient today, helps the patent to manage medication and take as instructed.  -discussed referral to colorectal surgeon. The patient continues to want to hold off on this referral.  -proceed to treatment Cycle 14 day 22 with bevacizumab .  -labs/flush, follow up, and Cycle 15 Day 1 as scheduled.    The patient understands the plans discussed today and is in agreement with them.  She knows to contact our office if she develops concerns prior to her next appointment.  I provided 30 minutes of face-to-face time during this encounter and > 50% was spent counseling as documented under my assessment and plan.    Sharyon Deis, NP  [facility] - A DEPT OF [facility]. [facility] [address] [place] [address] Dept: [phone] Dept Fax: [phone]   No orders of the defined types were placed in this encounter.     CHIEF COMPLAINT:  CC: Rectal adenocarcinoma  Current Treatment: CapeOx and bevacizumab  every 3 weeks with oxaliplatin  every 6 weeks.  INTERVAL HISTORY:  Jillian Lutz is here today for repeat clinical assessment.  She was last seen by myself on 10/16/2023.  Today is cycle 14 day 22.  She will receive bevacizumab . She reports pain in the right shoulder and arm. Pain is most severe near the medial aspect of the right wrist, mostly when picking up objects or trying to hold them in her right hand. Continues to have rectal pain. States that she is having regular bowel movements. Stool is soft. She denies chest pain, chest pressure, or shortness of breath. She denies headaches or visual disturbances. She denies abdominal pain, nausea, vomiting, or changes in bowel or bladder habits.   She denies fevers or chills. Her  appetite is fair. It comes and goes. Has had trouble with good appetite for the past several days. Looking at food isn't appetition.  Her weight has decreased 4 pounds over last 3 weeks .  I have reviewed the past medical history, past surgical history, social history and family history with the patient and they are unchanged from previous note.  ALLERGIES:  is allergic to no known allergies.  MEDICATIONS:  Current Outpatient Medications  Medication Sig Dispense Refill   amLODipine  (NORVASC ) 5 MG tablet Take 1 tablet (5 mg total) by mouth daily. 90 tablet 3   brimonidine  (ALPHAGAN ) 0.2 % ophthalmic solution Place 1 drop into the right eye 2 (two) times daily. 15 mL 3   brimonidine  (ALPHAGAN ) 0.2 % ophthalmic solution Place 1 drop into the right eye 2 (two) times daily. 10 mL 11   capecitabine  (XELODA ) 500 MG tablet Take 2 tabs every 12 hours, for 14 days then off for 7 days. Take 30mins after meal 56 tablet 2   dexamethasone  (DECADRON ) 4 MG tablet Take 1 tablet (4 mg total) by mouth daily. TAKE DAILY FOR 3-5 DAYS AFTER IV CHEMO 20 tablet 1   dorzolamide -timolol  (COSOPT ) 2-0.5 % ophthalmic solution Place 1 drop into both eyes 2 times daily. 30 mL 3   dorzolamide -timolol  (COSOPT ) 2-0.5 % ophthalmic solution Instill 1 drop into both eyes twice a day 10 mL 11   gabapentin  (  NEURONTIN ) 100 MG capsule Take 1 capsule (100 mg total) by mouth 3 (three) times daily as needed. 90 capsule 2   lidocaine  (XYLOCAINE ) 5 % ointment Apply to anal area daily as needed. 50 g 1   lidocaine -prilocaine  (EMLA ) cream Apply topically daily as needed. 30 g 3   lisinopril -hydrochlorothiazide  (ZESTORETIC ) 20-12.5 MG tablet Take 2 tablets by mouth daily. 60 tablet 3   mirtazapine  (REMERON ) 30 MG tablet Take 1 tablet (30 mg total) by mouth at bedtime. 30 tablet 3   Multiple Vitamins-Minerals (MULTIVITAMIN WITH MINERALS) tablet Take 1 tablet by mouth daily. Gummy 50 mg     ondansetron  (ZOFRAN ) 8 MG tablet Take 1 tablet (8 mg  total) by mouth every 8 (eight) hours as needed for nausea or vomiting. Take after 3 days of chemo 30 tablet 2   polyethylene glycol powder (GLYCOLAX/MIRALAX) 17 GM/SCOOP powder Take 1 Container by mouth once.     potassium chloride  (KLOR-CON  M) 10 MEQ tablet Take 1 tablet (10 mEq total) by mouth 2 (two) times daily. 60 tablet 2   prochlorperazine  (COMPAZINE ) 10 MG tablet Take 1 tablet (10 mg total) by mouth every 6 (six) hours as needed. 30 tablet 2   rivaroxaban  (XARELTO ) 20 MG TABS tablet Take 1 tablet (20 mg total) by mouth daily with supper. 30 tablet 2   traMADol  (ULTRAM ) 50 MG tablet Take 1 tablet (50 mg total) by mouth every 6 (six) hours as needed. 90 tablet 0   urea  (CARMOL) 10 % cream Apply topically 2 times daily as needed. 85 g 3   No current facility-administered medications for this visit.   Facility-Administered Medications Ordered in Other Visits  Medication Dose Route Frequency Provider Last Rate Last Admin   bevacizumab -adcd (VEGZELMA ) 350 mg in sodium chloride  0.9 % 100 mL chemo infusion  7.5 mg/kg (Treatment Plan Recorded) Intravenous Once Sonja [place], MD       heparin  lock flush 100 unit/mL  500 Units Intracatheter Once PRN Sonja Dysart, MD       sodium chloride  flush (NS) 0.9 % injection 10 mL  10 mL Intracatheter PRN Sonja [place], MD        HISTORY OF PRESENT ILLNESS:   Oncology History Overview Note  Cancer Staging Rectal adenocarcinoma [facility]) Staging form: Colon and Rectum, AJCC 8th Edition - Clinical stage from 12/21/2020: Stage IIIB (cT3, cN1, cM0) - Unsigned    Rectal adenocarcinoma (HCC)  08/26/2020 Miscellaneous   Initial presentation to PCP, reporting intermittent BRBPR since 02/2020    12/01/2020 Procedure   Colonoscopy by Dr. Leonia Raman findings - The perianal and digital rectal examinations were normal. - A 15 mm polyp was found in the ascending colon. The polyp was semi-pedunculated. Resection and retrieval were complete. - A 25 mm polyp was found in the sigmoid  colon. The polyp was pedunculated. Resection and retrieval were complete. - An infiltrative partially obstructing large mass was found in the proximal rectum. The mass was partially circumferential (involving one-half of the lumen circumference). The mass measured eight cm in length extending from 10 to 18cm from anal verge. This was biopsied with a cold forceps for histology. Proximal and distal opposite fold area of the mass lesion was tattooed with an injection of total 3 mL of Spot (carbon black).   12/01/2020 Initial Biopsy   Diagnosis 1. Colon, polyp(s), ascending x 1 - ADENOCARCINOMA ARISING IN A TUBULAR ADENOMA WITH HIGH-GRADE DYSPLASIA. SEE NOTE 2. Colon, sigmoid polyp, x1 - TUBULOVILLOUS ADENOMA(S) - NEGATIVE FOR HIGH-GRADE DYSPLASIA  OR MALIGNANCY 3. Rectum, biopsy - ADENOCARCINOMA. SEE NOTE   12/01/2020 Cancer Staging   Cancer Staging Rectal adenocarcinoma [facility]) Staging form: Colon and Rectum, AJCC 8th Edition - Clinical stage from 12/21/2020: Stage IIIB (cT3, cN1, cM0) - Unsigned    12/06/2020 Imaging   CT CAP with contrast IMPRESSION: 1. There is partially circumferential soft tissue thickening of the mid to superior rectum, approximately 5 cm in length and the inferior extent approximately 6 cm above the anal verge, consistent with primary rectal malignancy identified by colonoscopy. 2. There appear to be abnormally enlarged perirectal lymph nodes posteriorly about the superior rectum measuring up to 1.0 x 0.8 cm. Findings are suspicious for perirectal nodal metastatic disease, however rectal MRI is the test of choice for initial local staging of rectal cancer. 3. There is a 4 mm nonspecific pulmonary nodule of the superior segment left lower lobe, statistically most likely incidental, infectious or inflammatory, although nonspecific and isolated metastatic disease is not strictly excluded. Attention on follow-up. 4. No other evidence of metastatic disease in the chest,  abdomen, or pelvis. Aortic Atherosclerosis (ICD10-I70.0).   12/09/2020 Imaging   Local staging MRI pelvis without contrast IMPRESSION: 4.9 cm circumferential mid/lower rectal mass, corresponding to the patient's newly diagnosed rectal cancer. Rectal adenocarcinoma T stage: T3c Rectal adenocarcinoma N stage:  N1 Distance from tumor to the internal anal sphincter is 3.7 cm.   12/21/2020 Initial Diagnosis   Rectal adenocarcinoma (HCC)   01/04/2021 -  Chemotherapy   Concurrent chemoradiation with Xeloda  1000mg  in the AM and 1500mg  in the PM on days of Radiation starting 01/04/21.       01/04/2021 - 02/11/2021 Radiation Therapy   Concurrent chemoradiation with Dr Jeryl Moris and Xeloda  starting 01/04/21.    01/31/2022 Procedure   Colonoscopy, Dr. Leonia Raman  Findings: - An infiltrative partially obstructing large mass was found in the rectum. The mass was circumferential. The mass measured ten cm in length, extending from 5-15cm from anal verge. In addition, rectal diameter at narrowest approx twelve mm. Oozing was present. Biopsies were taken with a cold forceps for histology.  Impression: - Hemorrhoids found on perianal exam. - Likely malignant partially obstructing tumor in the rectum. Biopsied. - Non-bleeding external and internal hemorrhoids.   01/31/2022 Pathology Results   Diagnosis Rectum, biopsy INVASIVE MODERATELY DIFFERENTIATED ADENOCARCINOMA   05/11/2022 -  Chemotherapy   Patient is on Treatment Plan : COLORECTAL Xelox (Capeox)(130/850) q21d     09/07/2022 Imaging    IMPRESSION: 1. No significant change in circumferential wall thickening of the rectum, in keeping with known primary rectal mass. Similar appearance of post treatment perirectal and presacral fat stranding and fascial thickening 2. Unchanged nodule of the superior segment left lower lobe with adjacent bandlike scarring. No new nodules. 3. No evidence of lymphadenopathy or other metastatic disease in the chest,  abdomen, or pelvis. 4. Large burden of stool throughout the colon. 5. Cardiomegaly.   12/08/2022 Imaging    IMPRESSION: Persistent wall thickening along the rectum with adjacent severe inflammatory stranding and nodularity. Please correlate with exact location of the distribution of the patient's neoplasm.   No discrete new mass lesion, fluid collection or lymph node enlargement.   Improved visualization of the fractures involving the left side of the pubic symphysis as well as probable insufficiency fractures along the sacrum. Associated areas of increasing heterogeneous bony sclerosis along the pubic bones and sacrum. In principle sclerotic bone metastases would be in the differential but are felt to be less likely based  on the overall appearance. If needed confirmatory MRI can be considered as clinically appropriate to further delineate.   Stable scarring and fibrotic changes along the lungs with a tiny nodular area along the superior segment of the left lower lobe. Simple continued follow up.   Enlarged heart.   10/09/2023 Imaging   CT chest abdomen and pelvis with contrast  MPRESSION: 1. Persistent circumferential wall thickening in the rectum, not substantially changed taking into account less rectal distention on the current study. 2. Marked stool volume throughout the colon proximal to the rectum, similar to minimally progressive in the interval. While this may reflect clinical constipation, a degree of stricture related to the rectal neoplasm cannot be excluded. 3. No findings on the current study to suggest progressive or metastatic disease. 4. Heterogeneous mineralization of the sacrum, as before. Nonspecific by CT, features may reflect old trauma.       REVIEW OF SYSTEMS:   Constitutional: Denies fevers or chills. Has had 3 pound weight loss due to decreased appetite  Eyes: Denies blurriness of vision Ears, nose, mouth, throat, and face: Denies mucositis or sore  throat Respiratory: Denies cough, dyspnea or wheezes Cardiovascular: Denies palpitation, chest discomfort or lower extremity swelling Gastrointestinal:  Denies nausea, heartburn or change in bowel habits. Continues to have rectal pain with bowel movements.  Skin: Denies abnormal skin rashes Lymphatics: Denies new lymphadenopathy or easy bruising Neurological:Denies numbness, tingling or new weaknesses Behavioral/Psych: Mood is stable, no new changes  All other systems were reviewed with the patient and are negative.   VITALS:   Today's Vitals   11/05/23 0932  BP: 118/60  Pulse: 66  Resp: 17  Temp: 98.1 F (36.7 C)  SpO2: 95%  Weight: 99 lb 6.4 oz (45.1 kg)  PainSc: 8    Body mass index is 20.77 kg/m.   Wt Readings from Last 3 Encounters:  11/05/23 99 lb 6.4 oz (45.1 kg)  10/16/23 103 lb 4.8 oz (46.9 kg)  09/25/23 103 lb 14.4 oz (47.1 kg)    Body mass index is 20.77 kg/m.  Performance status (ECOG): 1 - Symptomatic but completely ambulatory  PHYSICAL EXAM:   GENERAL:alert, no distress and comfortable SKIN: skin color, texture, turgor are normal, no rashes or significant lesions EYES: normal, Conjunctiva are pink and non-injected, sclera clear OROPHARYNX:no exudate, no erythema and lips, buccal mucosa, and tongue normal  NECK: supple, thyroid normal size, non-tender, without nodularity. Single palpable cervical lymph node on left side of the neck.  LYMPH:  no palpable lymphadenopathy in the cervical, axillary or inguinal LUNGS: clear to auscultation and percussion with normal breathing effort HEART: regular rate & rhythm and no murmurs and no lower extremity edema ABDOMEN:abdomen soft, non-tender and normal bowel sounds Musculoskeletal:no cyanosis of digits and no clubbing  NEURO: alert & oriented x 3 with fluent speech, no focal motor/sensory deficits  LABORATORY DATA:  I have reviewed the data as listed    Component Value Date/Time   NA 137 11/05/2023 0758   NA  139 09/29/2019 1406   K 3.2 (L) 11/05/2023 0758   CL 101 11/05/2023 0758   CO2 31 11/05/2023 0758   GLUCOSE 102 (H) 11/05/2023 0758   BUN 19 11/05/2023 0758   BUN 12 09/29/2019 1406   CREATININE 0.97 11/05/2023 0758   CALCIUM 8.6 (L) 11/05/2023 0758   PROT 7.2 11/05/2023 0758   PROT 6.6 06/22/2016 1004   ALBUMIN 3.6 11/05/2023 0758   ALBUMIN 4.1 06/22/2016 1004   AST 14 (L)  11/05/2023 0758   ALT <5 11/05/2023 0758   ALKPHOS 51 11/05/2023 0758   BILITOT 0.4 11/05/2023 0758   GFRNONAA >60 11/05/2023 0758   GFRAA 88 09/29/2019 1406     Lab Results  Component Value Date   WBC 5.2 11/05/2023   NEUTROABS 3.0 11/05/2023   HGB 9.4 (L) 11/05/2023   HCT 28.5 (L) 11/05/2023   MCV 77.7 (L) 11/05/2023   PLT 214 11/05/2023    RADIOGRAPHIC STUDIES: CT CHEST ABDOMEN PELVIS W CONTRAST Result Date: 10/16/2023 CLINICAL DATA:  Rectal cancer.  Restaging.  * Tracking Code: BO * EXAM: CT CHEST, ABDOMEN, AND PELVIS WITH CONTRAST TECHNIQUE: Multidetector CT imaging of the chest, abdomen and pelvis was performed following the standard protocol during bolus administration of intravenous contrast. RADIATION DOSE REDUCTION: This exam was performed according to the departmental dose-optimization program which includes automated exposure control, adjustment of the mA and/or kV according to patient size and/or use of iterative reconstruction technique. CONTRAST:  OMNIPAQUE  IOHEXOL  300 MG/ML  SOLN COMPARISON:  07/16/2023 FINDINGS: CT CHEST FINDINGS Cardiovascular: The heart size is normal. No substantial pericardial effusion. Mild atherosclerotic calcification is noted in the wall of the thoracic aorta. Right Port-A-Cath tip is positioned in the right atrium. Mediastinum/Nodes: No mediastinal lymphadenopathy. There is no hilar lymphadenopathy. The esophagus has normal imaging features. There is no axillary lymphadenopathy. Lungs/Pleura: Tiny medial left upper lobe calcified granuloma again noted. No new  suspicious pulmonary nodule or mass. Left base atelectasis is similar to prior. No focal airspace consolidation. There is no evidence of pleural effusion. Musculoskeletal: No worrisome lytic or sclerotic osseous abnormality. CT ABDOMEN PELVIS FINDINGS Hepatobiliary: No suspicious focal abnormality within the liver parenchyma. There is no evidence for gallstones, gallbladder wall thickening, or pericholecystic fluid. No intrahepatic or extrahepatic biliary dilation. Pancreas: No focal mass lesion. No dilatation of the main duct. No intraparenchymal cyst. No peripancreatic edema. Spleen: No splenomegaly. No suspicious focal mass lesion. Adrenals/Urinary Tract: No adrenal nodule or mass. Kidneys unremarkable. No evidence for hydroureter. The urinary bladder appears normal for the degree of distention. Stomach/Bowel: Stomach is unremarkable. No gastric wall thickening. No evidence of outlet obstruction. Duodenum is normally positioned as is the ligament of Treitz. No small bowel wall thickening. No small bowel dilatation. The terminal ileum is normal. The appendix is best seen on coronal images and is unremarkable. There is circumferential wall thickening in the rectum, likely not substantially changed given the decreased distention of the rectum on the current study. Perirectal edema/inflammation is similar. There is a marked amount of stool in the colon proximal to the rectum, stable to minimally increased in the interval. Vascular/Lymphatic: There is moderate atherosclerotic calcification of the abdominal aorta without aneurysm. There is no gastrohepatic or hepatoduodenal ligament lymphadenopathy. No retroperitoneal or mesenteric lymphadenopathy. No pelvic sidewall lymphadenopathy. Reproductive: The uterus is unremarkable.  There is no adnexal mass. Other: No intraperitoneal free fluid. Musculoskeletal: Heterogeneous mineralization in the sacrum, stable. Mild anterolisthesis of L5 on S1 is similar to prior.  IMPRESSION: 1. Persistent circumferential wall thickening in the rectum, not substantially changed taking into account less rectal distention on the current study. 2. Marked stool volume throughout the colon proximal to the rectum, similar to minimally progressive in the interval. While this may reflect clinical constipation, a degree of stricture related to the rectal neoplasm cannot be excluded. 3. No findings on the current study to suggest progressive or metastatic disease. 4. Heterogeneous mineralization of the sacrum, as before. Nonspecific by CT, features may reflect  old trauma. Electronically Signed   By: Donnal Fusi M.D.   On: 10/16/2023 08:19

## 2023-11-05 ENCOUNTER — Encounter: Payer: Self-pay | Admitting: Nurse Practitioner

## 2023-11-05 ENCOUNTER — Ambulatory Visit: Payer: Self-pay

## 2023-11-05 ENCOUNTER — Other Ambulatory Visit: Payer: Medicaid Other

## 2023-11-05 ENCOUNTER — Inpatient Hospital Stay: Payer: Medicaid Other

## 2023-11-05 ENCOUNTER — Inpatient Hospital Stay (HOSPITAL_BASED_OUTPATIENT_CLINIC_OR_DEPARTMENT_OTHER)

## 2023-11-05 ENCOUNTER — Other Ambulatory Visit (HOSPITAL_COMMUNITY): Payer: Self-pay

## 2023-11-05 ENCOUNTER — Inpatient Hospital Stay: Payer: Medicaid Other | Admitting: Nurse Practitioner

## 2023-11-05 VITALS — BP 118/60 | HR 66 | Temp 98.1°F | Resp 17 | Wt 99.4 lb

## 2023-11-05 DIAGNOSIS — C20 Malignant neoplasm of rectum: Secondary | ICD-10-CM

## 2023-11-05 DIAGNOSIS — D509 Iron deficiency anemia, unspecified: Secondary | ICD-10-CM

## 2023-11-05 DIAGNOSIS — Z5111 Encounter for antineoplastic chemotherapy: Secondary | ICD-10-CM | POA: Diagnosis not present

## 2023-11-05 DIAGNOSIS — Z95828 Presence of other vascular implants and grafts: Secondary | ICD-10-CM

## 2023-11-05 LAB — TOTAL PROTEIN, URINE DIPSTICK: Protein, ur: NEGATIVE mg/dL

## 2023-11-05 LAB — CBC WITH DIFFERENTIAL (CANCER CENTER ONLY)
Abs Immature Granulocytes: 0.02 10*3/uL (ref 0.00–0.07)
Basophils Absolute: 0 10*3/uL (ref 0.0–0.1)
Basophils Relative: 1 %
Eosinophils Absolute: 0.1 10*3/uL (ref 0.0–0.5)
Eosinophils Relative: 2 %
HCT: 28.5 % — ABNORMAL LOW (ref 36.0–46.0)
Hemoglobin: 9.4 g/dL — ABNORMAL LOW (ref 12.0–15.0)
Immature Granulocytes: 0 %
Lymphocytes Relative: 23 %
Lymphs Abs: 1.2 10*3/uL (ref 0.7–4.0)
MCH: 25.6 pg — ABNORMAL LOW (ref 26.0–34.0)
MCHC: 33 g/dL (ref 30.0–36.0)
MCV: 77.7 fL — ABNORMAL LOW (ref 80.0–100.0)
Monocytes Absolute: 0.9 10*3/uL (ref 0.1–1.0)
Monocytes Relative: 16 %
Neutro Abs: 3 10*3/uL (ref 1.7–7.7)
Neutrophils Relative %: 58 %
Platelet Count: 214 10*3/uL (ref 150–400)
RBC: 3.67 MIL/uL — ABNORMAL LOW (ref 3.87–5.11)
RDW: 20.1 % — ABNORMAL HIGH (ref 11.5–15.5)
WBC Count: 5.2 10*3/uL (ref 4.0–10.5)
nRBC: 0 % (ref 0.0–0.2)

## 2023-11-05 LAB — CMP (CANCER CENTER ONLY)
ALT: <5 U/L (ref 0–44)
AST: 14 U/L — ABNORMAL LOW (ref 15–41)
Albumin: 3.6 g/dL (ref 3.5–5.0)
Alkaline Phosphatase: 51 U/L (ref 38–126)
Anion gap: 5 (ref 5–15)
BUN: 19 mg/dL (ref 8–23)
CO2: 31 mmol/L (ref 22–32)
Calcium: 8.6 mg/dL — ABNORMAL LOW (ref 8.9–10.3)
Chloride: 101 mmol/L (ref 98–111)
Creatinine: 0.97 mg/dL (ref 0.44–1.00)
GFR, Estimated: >60 mL/min (ref >60)
Glucose, Bld: 102 mg/dL — ABNORMAL HIGH (ref 70–99)
Potassium: 3.2 mmol/L — ABNORMAL LOW (ref 3.5–5.1)
Sodium: 137 mmol/L (ref 135–145)
Total Bilirubin: 0.4 mg/dL (ref 0.0–1.2)
Total Protein: 7.2 g/dL (ref 6.5–8.1)

## 2023-11-05 LAB — FERRITIN: Ferritin: 50 ng/mL (ref 11–307)

## 2023-11-05 LAB — CEA (ACCESS): CEA (CHCC): 16.28 ng/mL — ABNORMAL HIGH (ref 0.00–5.00)

## 2023-11-05 MED ORDER — HEPARIN SOD (PORK) LOCK FLUSH 100 UNIT/ML IV SOLN
500.0000 [IU] | Freq: Once | INTRAVENOUS | Status: AC | PRN
  Administered 2023-11-05: 500 [IU]

## 2023-11-05 MED ORDER — SODIUM CHLORIDE 0.9% FLUSH
10.0000 mL | INTRAVENOUS | Status: DC | PRN
  Administered 2023-11-05: 10 mL

## 2023-11-05 MED ORDER — SODIUM CHLORIDE 0.9 % IV SOLN
Freq: Once | INTRAVENOUS | Status: AC
Start: 2023-11-05 — End: 2023-11-05

## 2023-11-05 MED ORDER — SODIUM CHLORIDE 0.9 % IV SOLN
7.5000 mg/kg | Freq: Once | INTRAVENOUS | Status: AC
  Administered 2023-11-05: 350 mg via INTRAVENOUS
  Filled 2023-11-05: qty 14

## 2023-11-05 MED ORDER — POTASSIUM CHLORIDE CRYS ER 10 MEQ PO TBCR
10.0000 meq | EXTENDED_RELEASE_TABLET | Freq: Two times a day (BID) | ORAL | 2 refills | Status: DC
  Filled 2023-11-05 – 2023-12-20 (×3): qty 60, 30d supply, fill #0

## 2023-11-05 MED ORDER — SODIUM CHLORIDE 0.9% FLUSH
10.0000 mL | Freq: Once | INTRAVENOUS | Status: AC
  Administered 2023-11-05: 10 mL

## 2023-11-05 NOTE — Patient Instructions (Signed)
 [facility] - A DEPT OF [place] [facility]  Discharge Instructions: Thank you for choosing [facility] to provide your oncology and hematology care.   If you have a lab appointment with the Cancer Center, please go directly to the Cancer Center and check in at the registration area.   Wear comfortable clothing and clothing appropriate for easy access to any Portacath or PICC line.   We strive to give you quality time with your provider. You may need to reschedule your appointment if you arrive late (15 or more minutes).  Arriving late affects you and other patients whose appointments are after yours.  Also, if you miss three or more appointments without notifying the office, you may be dismissed from the clinic at the provider's discretion.      For prescription refill requests, have your pharmacy contact our office and allow 72 hours for refills to be completed.    Today you received the following chemotherapy and/or immunotherapy agents: Bevacizumab  and Oxaliplatin       To help prevent nausea and vomiting after your treatment, we encourage you to take your nausea medication as directed.  BELOW ARE SYMPTOMS THAT SHOULD BE REPORTED IMMEDIATELY: *FEVER GREATER THAN 100.4 F (38 C) OR HIGHER *CHILLS OR SWEATING *NAUSEA AND VOMITING THAT IS NOT CONTROLLED WITH YOUR NAUSEA MEDICATION *UNUSUAL SHORTNESS OF BREATH *UNUSUAL BRUISING OR BLEEDING *URINARY PROBLEMS (pain or burning when urinating, or frequent urination) *BOWEL PROBLEMS (unusual diarrhea, constipation, pain near the anus) TENDERNESS IN MOUTH AND THROAT WITH OR WITHOUT PRESENCE OF ULCERS (sore throat, sores in mouth, or a toothache) UNUSUAL RASH, SWELLING OR PAIN  UNUSUAL VAGINAL DISCHARGE OR ITCHING   Items with * indicate a potential emergency and should be followed up as soon as possible or go to the Emergency Department if any problems should occur.  Please show the CHEMOTHERAPY ALERT CARD  or IMMUNOTHERAPY ALERT CARD at check-in to the Emergency Department and triage nurse.  Should you have questions after your visit or need to cancel or reschedule your appointment, please contact [facility] - A DEPT OF [facility]  Dept: [phone]  and follow the prompts.  Office hours are 8:00 a.m. to 4:30 p.m. Monday - Friday. Please note that voicemails left after 4:00 p.m. may not be returned until the following business day.  We are closed weekends and major holidays. You have access to a nurse at all times for urgent questions. Please call the main number to the clinic Dept: [phone] and follow the prompts.   For any non-urgent questions, you may also contact your provider using MyChart. We now offer e-Visits for anyone 57 and older to request care online for non-urgent symptoms. For details visit [email or web address].   Also download the MyChart app! Go to the app store, search "MyChart", open the app, select [place], and log in with your MyChart username and password.  Bevacizumab  Injection ?y l thu?c g? BEVACIZUMAB  (??c l be va SIZ yoo mab) ?i?u tr? m?t s? lo?i ung th?. Thu?c ny tc d?ng b?ng cch ng?n ch?n m?t lo?i protein khi?n t? bo ung th? pht tri?n v nhn ln. ?i?u ny gip lm ch?m ho?c ng?n ch?n s? ly lan c?a cc t? bo ung th?. ?y l khng th? ??n dng. Thu?c ny c th? ???c dng cho nh?ng m?c ?ch khc; hy h?i ng??i cung c?p d?ch v? y t? ho?c d??c s? c?a mnh,  n?u qu v? c th?c m?c. (CC) NHN HI?U PH? BI?N: Alymsys , Avastin , MVASI , Vegzalma, Zirabev  Ti c?n ph?i bo cho ng??i cung c?p d?ch v? y t? c?a mnh ?i?u g tr??c khi dng thu?c ny? H? c?n bi?t li?u qu v? hi?n c b?t k? tnh tr?ng no sau ?y hay khng: C?c mu ?ng Ho ra mu ?ang gi?i ph?u ho?c gi?i ph?u m?i ?y Suy tim Huy?t a?p cao Ti?n s? c ch? thng gi?a 2 ho?c nhi?u b? ph?n c? th? th??ng khng thng nhau (l? r) Ti?n s? rch d? dy ho?c ru?t C protein  trong n??c ti?u c?a qu v? Pha?n ??ng b?t th???ng ho??c ph?n ?ng di? ??ng v??i bevacizumab , ca?c d??c ph?m kha?c, th?c ph?m, thu?c nhu?mu, ho??c ch?t ba?o qua?n ?ang c thai ho??c ??nh co? thai ?ang cho con bu? Ti nn s? d?ng thu?c ny nh? th? no? Thu?c ny ?? tim vo t?nh m?ch. Thu?c ny ???c ??i ng? ch?m [place] c?a qu v? cho dng t?i b?nh vi?n ho?c phng m?ch. Hy h?i ??i ng? ch?m [place] c?a qu v? v? vi?c dng thu?c ny ? tr? em. C th? c?n ch?m [place] ??c bi?t. Qu li?u: N?u qu v? cho r?ng mnh ? dng qu nhi?u thu?c ny, th hy lin l?c v?i trung tm ki?m sot ch?t ??c ho?c phng c?p c?u ngay l?p t?c.<br />L?U : Thu?c ny ch? dnh ring cho qu v?. Khng chia s? thu?c ny v?i nh?ng ng??i khc. N?u ti l? qun m?t li?u th sao? Hy ??n cc cu?c h?n khm ?? nh?n cc li?u thu?c ti?p theo. ?i?u quan tr?ng l khng nn b? l? li?u thu?c c?a qu v?. Hy g?i cho ??i ng? ch?m [place] c?a mnh n?u qu v? khng th? ??n m?t cu?c h?n khm. Nh?ng g c th? t??ng tc v?i thu?c ny? Cc t??ng tc v?i thu?c khng x?y ra. Danh sch ny c th? khng m t? ?? h?t cc t??ng tc c th? x?y ra. Hy ??a cho ng??i cung c?p d?ch v? y t? c?a mnh danh sch t?t c? cc thu?c, th?o d??c, cc thu?c khng c?n toa, ho?c cc ch? ph?m b? sung m qu v? dng. C?ng nn bo cho h? bi?t r?ng qu v? c ht thu?c, u?ng r??u, ho?c c s? d?ng ma ty tri php hay khng. Vi th? c th? t??ng tc v?i thu?c c?a qu v?. Ti c?n ph?i theo di ?i?u g trong khi dng thu?c ny? Tnh tr?ng c?a qu v? se? ????c theo do?i c?n th?n trong khi qu v? ?ang du?ng thu?c na?y. Qu v? c th? c?n ?i lm xt nghi?m mu trong khi ?ang dng thu?c ny. Thu?c ny c th? lm cho qu v? c?m th?y khng ???c kh?e nh? th??ng l?. ?i?u ny khng ph?i l hi?m g?p, v thu?c ha tr? li?u c th? tc ??ng ln t? bo lnh l?n t? bo ung th?. Hy t??ng trnh m?i tc d?ng ph?. Hy ti?p t?c ??t ?i?u tr? c?a mnh ngay c? khi qu v? c?m th?y m?t, tr? khi ??i ng? ch?m [place] yu  c?u qu v? ng?ng ?i?u tr?. Thu?c ny c th? lm t?ng nguy c? b? b?m tm ho?c ch?y mu. Hy g?i cho ??i ng? ch?m [place], n?u qu v? ??  th?y b?t k? tnh tr?ng ch?y mu b?t th??ng no. Tr??c khi gi?i ph?u, hy bn b?c v?i ??i ng? ch?m [place] c?a qu v? ?? ??m b?o c th? ti?n hnh gi?i ph?u. Thu?c ny c th? lm t?ng nguy  c? v?t th??ng ho?c v?t m? ch?m lnh. Qu v? s? c?n ng?ng dng thu?c ny 28 ngy tr??c khi gi?i ph?u. Sau khi gi?i ph?u, hy ??i t nh?t 28 ngy tr??c khi b?t ??u dng l?i thu?c ny. ??m b?o v?t m? ho?c v?t th??ng ? ?? lnh tr??c khi b?t ??u dng l?i thu?c ny. Hy h?i ??i ng? ch?m [place] c?a mnh, n?u c th?c m?c. Hy h?i ??i ng? ch?m [place] c?a mnh, n?u qu v? c [place]. Thu?c ny c th? gy d? t?t b?m sinh nghim tr?ng n?u dng trong [place] k? v trong 6 thng sau li?u thu?c cu?i cng. Nn trnh [place] trong khi dng thu?c ny v trong 6 thng sau li?u thu?c cu?i cng. ??i ng? ch?m [place] c th? gip qu v? tm ra l?a ch?n ph h?p v?i qu v?. Khng ???c cho tr? b m? trong khi dng thu?c ny v trong vng 6 thng sau khi dng li?u thu?c cu?i cng. Thu?c ny c th? gy v sinh. Hy th?o lu?n v?i ??i ng? ch?m [place] c?a mnh, n?u qu v? lo l??ng v? kha? n?ng sinh s?n c?a mnh. Ti c th? nh?n th?y nh?ng tc d?ng ph? no khi dng thu?c ny? Nh?ng tc d?ng ph? qu v? c?n ph?i bo cho ??i ng? ch?m [place] cng s?m cng t?t: Cc ph?n ?ng d? ?ng -- da b? n?i ban, ng?a, n?i my ?ay, s?ng ? m?t, mi, l??i, ho?c h?ng Xu?t huy?t -- phn c mu ho?c mu ?en, mu h?c n, i ra mu ho?c ch?t mu nu gi?ng b c ph, n??c ti?u mu ?? ho?c nu s?m, cc ??m nh? mu ?? ho?c tm trn da, b?m tm ho?c ch?y mu b?t th??ng C?c mu ?ng -- ?au, s?ng ho?c nng ? chn, kh th?, ?au ng?c Nh?i mu c? tim -- ?au ho?c t?c ? ng?c, vai, cnh tay ho?c hm, bu?n i, kh th?, da l?nh ho?c ?m ??t, c?m th?y y?u ?t ho?c chong vng Suy tim -- kh th?, s?ng m?t c chn, bn chn ho?c bn tay, t?ng cn ??t ng?t, y?u ?t ho?c m?t m?i khc th??ng T?ng  huy?t p Nhi?m trng -- s?t, ?n l?nh, ho, ?au h?ng, v?t th??ng khng lnh, ?au khi ?i ti?u ho?c kh ?i ti?u, c?m gic kh ch?u chung ho?c c?m th?y khng kh?e Ph?n ?ng truy?n d?ch -- ?au ng?c, kh th? ho?c kh th?, c?m th?y y?u ?t ho?c chong vng T?n th??ng th?n -- gi?m l??ng n??c ti?u, s?ng ? m?t c chn, bn tay ho?c bn chn ?au bao t? d? d?i, khng h?t ho?c tr? nn t?i t? h?n ??t qu? -- ??t ng?t b? t ho?c y?u ? m?t, cnh tay ho?c chn, ni kh, l l?n, kh ?i l?i, m?t th?ng b?ng ho?c ph?i h?p ??ng tc, chng m?t, ?au ??u d? d?i, thay ??i th? l?c ?au ??u ??t ng?t v d? d?i, l l?n, thay ??i th? l?c, co gi?t, c th? l d?u hi?u c?a h?i ch?ng b?nh no sau c h?i ph?c (PRES) Cc tc d?ng ph? th??ng khng c?n ph?i ch?m [place] y t? (hy bo cho ??i ng? ch?m [place] n?u cc tc d?ng ph? ny ti?p di?n ho?c gy phi?n toi): ?au l?ng Thay ??i v? gic Tiu ch?y Da kh Ch?y nhi?u n??c m?t Ch?y mu cam Danh sch ny c th? khng m t? ?? h?t cc tc d?ng ph? c th? x?y ra. Xin g?i t?i bc s? c?a mnh ?? ???c c? v?n chuyn mn v? cc  tc d?ng ph?Kathlynn Pardon v? c th? t??ng trnh cc tc d?ng ph? cho FDA theo s? [phone]. Ti nn c?t gi? thu?c c?a mnh ? ?u? Thu?c ny th??ng ???c cho dng t?i b?nh vi?n ho?c phng m?ch. Thu?c ny khng ???c c?t gi? t?i nh. L?U : ?y l b?n tm t?t. N c th? khng bao hm t?t c? thng tin c th? c. N?u qu v? th?c m?c v? thu?c ny, xin trao ??i v?i bc s?, d??c s?, ho?c ng??i cung c?p d?ch v? y t? c?a mnh.  2024 Elsevier/Gold Standard (2022-05-24 00:00:00)

## 2023-11-05 NOTE — Telephone Encounter (Signed)
  Chief Complaint: Arm pain Symptoms: pain Frequency: constant X 2 weeks Pertinent Negatives: Patient denies fever, injury Disposition: [] ED /[] Urgent Care (no appt availability in office) / [x] Appointment(In office/virtual)/ []  [facility]/ [] Home Care/ [] Refused Recommended Disposition /[] [facility] [facility]/ []  Follow-up with PCP Additional Notes:  Spoke with nurse Sheryle Donning, just today Hayde reported to Sheryle Donning that for two weeks she has been experiencing right sided arm/bicep area pain that radiates to her wrist, wrist looks "puffy'. Rates pain 8/10. Difficulty lifting with her arm, able to raise arm above shoulder, has full ROM. Denies injury, no bruising. Denies all other symptoms. Acute visit scheduled on 11/08/23, fyi-unable to come in sooner due to transportation issues. Educated on care advice as documented in protocol, patient verbalized understanding. Discussed reasons to call back.    Message from [place] C sent at 11/05/2023  9:43 AM EDT  Copied From CRM [id number]. Reason for Triage: Patient has been having radiating pain in her right arm for the last 2 weeks now. Sheryle Donning her congregation nurse would like a call back at [phone].   Reason for Disposition  [1] MODERATE pain (e.g., interferes with normal activities) AND [2] present > 3 days  Protocols used: Arm Pain-A-AH

## 2023-11-05 NOTE — Congregational Nurse Program (Signed)
 Accompanied patient to appointment at [facility].  Chief complaint today is pain in right arm and wrist present 2 weeks. States she is unable to lift anything with that arm and that pain is at level 8.  CN contacted [facility] and scheduled appointment on Thursday 11/08/23 at 2:15 pm.  Medicaid transportation requested and will pick her up at 1:35 pm.  At today's visit her potassium was still low and she was instructed to increase her potassium dosage from 1 to 2 pills per day. Next appointment is May 13 at 9:45 am.  Raeford Bullion RN, Congregational Nurse [phone]

## 2023-11-08 ENCOUNTER — Telehealth: Payer: Self-pay

## 2023-11-08 ENCOUNTER — Other Ambulatory Visit (HOSPITAL_COMMUNITY): Payer: Self-pay

## 2023-11-08 ENCOUNTER — Ambulatory Visit (HOSPITAL_COMMUNITY)
Admission: RE | Admit: 2023-11-08 | Discharge: 2023-11-08 | Disposition: A | Discharge status: Home / Self Care | Source: Ambulatory Visit | Attending: Internal Medicine | Admitting: Internal Medicine

## 2023-11-08 ENCOUNTER — Other Ambulatory Visit: Payer: Self-pay

## 2023-11-08 ENCOUNTER — Ambulatory Visit: Payer: Self-pay | Admitting: Student

## 2023-11-08 VITALS — BP 109/66 | HR 68 | Temp 98.1°F | Ht <= 58 in | Wt 100.7 lb

## 2023-11-08 DIAGNOSIS — I10 Essential (primary) hypertension: Secondary | ICD-10-CM | POA: Diagnosis not present

## 2023-11-08 DIAGNOSIS — M25531 Pain in right wrist: Secondary | ICD-10-CM | POA: Insufficient documentation

## 2023-11-08 DIAGNOSIS — M1811 Unilateral primary osteoarthritis of first carpometacarpal joint, right hand: Secondary | ICD-10-CM | POA: Diagnosis not present

## 2023-11-08 MED ORDER — AMLODIPINE BESYLATE 5 MG PO TABS
5.0000 mg | ORAL_TABLET | Freq: Every day | ORAL | 3 refills | Status: DC
  Filled 2023-11-08: qty 90, 90d supply, fill #0

## 2023-11-08 MED ORDER — RIVAROXABAN 20 MG PO TABS
20.0000 mg | ORAL_TABLET | Freq: Every day | ORAL | 2 refills | Status: DC
  Filled 2023-11-08 – 2024-01-07 (×3): qty 30, 30d supply, fill #0

## 2023-11-08 NOTE — Progress Notes (Signed)
 Specialty Pharmacy Refill Coordination Note  Mava Puthoff is a 73 y.o. female, Raeford Bullion, RN (patient's congregational nurse) was contacted today regarding refills of specialty medication(s) Capecitabine  (XELODA )   Patient requested [place] [facility] at [facility] at [place] date: 11/08/23   Medication will be filled on 11/08/23.

## 2023-11-08 NOTE — Telephone Encounter (Signed)
 Phone call to [place] at [facility].  Reported that patient completed current round of Xeloda  on 11/06/2023 and will resume on 11/13/2023.  Prescription will be ready for pickup late this afternoon.  CN will pickup but hold delivery to patient until 11/12/23 to avoid confusion on when she is to restart. Raeford Bullion RN, Congregational Nurse [phone]

## 2023-11-08 NOTE — Progress Notes (Addendum)
 Acute Office Visit  Subjective:     Patient ID: Jillian Lutz, female    DOB: 1951/03/23, 73 y.o.   MRN: [id number]  Chief Complaint  Patient presents with   Arm Pain   Patient is a 73 y.o. with a past medical history stated below who presents today for acute right arm/wrist pain. She is accompanied by a Engineer, water, Orelia Binet, and a [employer] in-person interpreter. Patient requested refills for amlodipine  and Xarelto . Confirmed with Dr. Maryalice Smaller, the patient's oncologist, that a refill for Xarelto  was appropriate. Please see problem based assessment and plan for additional details.     Review of Systems  Constitutional:  Negative for fever.  Musculoskeletal:  Positive for joint pain. Negative for neck pain.  Skin:  Negative for rash.      Objective:    BP 109/66 (BP Location: Right Arm, Patient Position: Sitting, Cuff Size: Small)   Pulse 68   Temp 98.1 F (36.7 C) (Oral)   Ht 4\' 10"  (1.473 m)   Wt 100 lb 11.2 oz (45.7 kg)   SpO2 99%   BMI 21.05 kg/m  BP Readings from Last 3 Encounters:  11/08/23 109/66  11/05/23 118/60  10/16/23 106/60   Wt Readings from Last 3 Encounters:  11/08/23 100 lb 11.2 oz (45.7 kg)  11/05/23 99 lb 6.4 oz (45.1 kg)  10/16/23 103 lb 4.8 oz (46.9 kg)    Physical Exam HENT:     Head: Normocephalic.     Mouth/Throat:     Mouth: Mucous membranes are moist.  Musculoskeletal:     Comments: Patient able to adduct/abduct right arm, able to pronate and supinate arm without pain. Tinel's sign negative. Endorsed tenderness with light palpation to right wrist joint. Right wrist did not appear more enlarged, edematous, erythematous, or warm to touch compared to left wrist. Right wrist flexion and extension limited by pain.   Neurological:     Mental Status: She is alert.    Results for orders placed or performed in visit on 11/08/23  Uric acid  Result Value Ref Range   Uric Acid 5.7 3.1 - 7.9 mg/dL       Assessment & Plan:   Problem  List Items Addressed This Visit     Essential hypertension   Patient's BP today 109/66. BP was 113/74 at [facility] visit. Patient endorses taking amlodipine  5 mg daily and lisinopril -hydrochlorothiazide  20-12.5 mg BID. Needs amlodipine  refill. Denies lightheadedness, dizziness, chest pain, lower extremity swelling, vision changes, or heart palpitations. Patient states she has only felt that way during chemo sessions but this improves after the session. CV exam today unremarkable. Discussed return precautions to clinic, especially if blood pressure is consistently 90-100/60s and patient experiences any lightheadedness, weakness, or feels like she may pass out/fall if she stands up too quickly or changes positions. Patient endorsed understanding Plan - Continue amlodipine  5 mg daily and lisinopril -hydrochlorothiazide  20-12.5 mg BID - Monitor for symptoms, consider only taking 1-2 antihypertensive medications rather than 3       Relevant Medications   amLODipine  (NORVASC ) 5 MG tablet   rivaroxaban  (XARELTO ) 20 MG TABS tablet   Acute pain of right wrist - Primary   Patient presents with worsening right wrist pain. Initially the pain was reported as right arm pain but patient state symptoms are mostly at her wrist. States it may have gotten worse after port cath placement on right side of chest, is right hand dominant and has been limited in what housework she  can do due to pain. Denies any fever, injury, or trauma. On chart review the patient does have a history of bilateral carpal tunnel syndrome and bilateral wrist pain around 2019 where left wrist pain was worse than the right. Wrist pain previously thought to be related to carpal tunnel, told to wear wrist braces at night and given short course of prednisone  or joint injections. Patient's current pain not improved despite taking gabapentin  (prescribed for carpal tunnel syndrome) and tramadol  (prescribed for cancer related pain). On exam, patient was  able to adduct/abduct right arm and pronate and supinate arm without pain. Tinel's sign negative. Endorsed tenderness with light palpation to right wrist joint. Right wrist did not appear more enlarged, edematous, erythematous, or warm to touch compared to left wrist. Flexion and extension of the right wrist was limited by pain. Adhesive capsulitis and right rotator cuff injury seem less likely given focus of symptoms in right wrist. Unilateral wrist pain makes RA/OA less likely but patient is at risk of degenerative changes in the joints. Low suspicion for septic joint or gout given normal outer appearance of wrist. Uric acid level WNL today. Due to tenderness with light palpation will get wrist xray to rule out acute fracture. Discussed that one of her cancer medications, Bevacizumab , can cause joint paint. Advised her to mention wrist/joint pain to Dr. Maryalice Smaller. For now, patient agreeable to conservative pain management.  Plan:  - Apply ice to the affected area as needed (patient may be more sensitive to cold due to cancer treatments) - Continue to take Tylenol  as needed - Patient to take ibuprofen  for a few days to see if that will provide symptom relief, advised not to take for an extended amount of time - Recommended wearing a wrist splint to see if it will help with the inflammation, patient can try wearing at night - Consider a sports medicine referral, joint injections, or ultrasound/further imaging if little to no improvement with conservative management  - Return precautions provided if wrist becomes red and swollen, patient experiences fever or you any weakness in the right arm      Relevant Orders   Uric acid (Completed)   DG Wrist Complete Right   Meds ordered this encounter  Medications   amLODipine  (NORVASC ) 5 MG tablet    Sig: Take 1 tablet (5 mg total) by mouth daily.    Dispense:  90 tablet    Refill:  3    IM Program   rivaroxaban  (XARELTO ) 20 MG TABS tablet    Sig: Take 1  tablet (20 mg total) by mouth daily with supper.    Dispense:  30 tablet    Refill:  2   Patient discussed with Dr. Adriane Albe.  Return in about 4 weeks (around 12/06/2023), or if symptoms worsen or fail to improve (right wrist).  Yarel Rushlow Arellano Zameza, MD PGY-1, [facility]

## 2023-11-08 NOTE — Congregational Nurse Program (Signed)
 Accompanied patient to appointment with PCP for right wrist pain present 2 weeks.  Dr. Zaneza recommended ice, splinting and ibuprofen  every 8 hours alternating with tylenol  every 4-6 hours.  Labwork (uric acid) and x-ray preformed.  CN will purchase ibuprofen  and take to patient tomorrow.  Return visit scheduled for 12/06/23 @9 :45 am unless symptoms worsen.  If so she should call for earlier appointment and will also notify clinic if symptoms improve with current treatment plan.  Raeford Bullion RN, Congregational Nurse [phone]

## 2023-11-08 NOTE — Patient Instructions (Addendum)
 Thank you, Ms.Darly Heckel for allowing us  to provide your care today. Today we discussed your right wrist pain.   I have ordered the following labs for you:   Lab Orders         Uric acid      Tests ordered today:  Right wrist xray  Referrals ordered today:   Referral Orders  No referral(s) requested today     I have ordered the following medication/changed the following medications:   Stop the following medications: Medications Discontinued During This Encounter  Medication Reason   amLODipine  (NORVASC ) 5 MG tablet Reorder     Start the following medications: Meds ordered this encounter  Medications   amLODipine  (NORVASC ) 5 MG tablet    Sig: Take 1 tablet (5 mg total) by mouth daily.    Dispense:  90 tablet    Refill:  3    IM Program     Follow up: 1 month or earlier as needed to follow up on right wrist pain    Remember:   For your right wrist:  - We will get an xray of your right wrist to rule out acute injury.  - One of your medications, Bevacizumab , can cause joint pain and you may want to mention it to Dr. Maryalice Smaller when you see her.  - We will treat your wrist pain conservatively for now with pain management. If the following things don't help to improve your pain within the next few weeks we can consider a sports medicine referral or ultrasound/further imaging.  - If your wrist becomes red and swollen, you experience fever, or you experience any weakness in the right arm, please go to urgent care or the emergency department as you may be experiencing a medical emergency.  For symptom relief:  - Apply ice to the affected area as needed - Continue to take Tylenol  as needed - You can take ibuprofen  for a few days to see if that will provide symptom relief, do not take for an extended amount of time as this can lead to kidney injury  - You can try wearing a wrist splint to see if it will help with the inflammation, you can wear it at night while you sleep to see if  it provides some relief   - I've messaged Dr. Maryalice Smaller about your Xarelto  refill, which you take for Deep Vein Thrombosis prophylaxis, to make sure it's ok to refill. You may also call her office to request the refill as this may be quicker.  - Your blood pressure today was 109/66. If you ever feel weak/lightheaded check your blood pressure. If your blood pressure is consistently close to 90/60 please call our clinic as we may need to reduce your blood pressure medications.   Should you have any questions or concerns please call the internal medicine clinic at [phone].     Laquanta Hummel Arellano Zameza, MD PGY-1 Internal Medicine Teaching Progam [facility]

## 2023-11-09 LAB — URIC ACID: Uric Acid: 5.7 mg/dL (ref 3.1–7.9)

## 2023-11-09 NOTE — Congregational Nurse Program (Signed)
 Home visit to provide patient with ibuprofen  and Xeloda .  Instructed patient to take 1 tablet (200 mg) ibuprofen  every 8 hours (start @ 12:00 pm, 8 pm and 4 am if she wakes up with pain).  We wrote on bag containing Xeloda  (in English and Jarai) not to start until Tuesday 11/13/2023.  Patient was wearing wrist splint.  She is going to freeze a bottle of water to ice wrist since she does not have any ice.  Told patient I will call on Monday to see if current treatment is helping.  Raeford Bullion RN, Congregational Nurse [phone]

## 2023-11-11 ENCOUNTER — Other Ambulatory Visit: Payer: Self-pay

## 2023-11-12 ENCOUNTER — Other Ambulatory Visit: Payer: Self-pay

## 2023-11-12 DIAGNOSIS — M25531 Pain in right wrist: Secondary | ICD-10-CM | POA: Insufficient documentation

## 2023-11-12 NOTE — Assessment & Plan Note (Addendum)
 Patient's BP today 109/66. BP was 113/74 at February [facility] visit. Patient endorses taking amlodipine  5 mg daily and lisinopril -hydrochlorothiazide  20-12.5 mg BID. Needs amlodipine  refill. Denies lightheadedness, dizziness, chest pain, lower extremity swelling, vision changes, or heart palpitations. Patient states she has only felt that way during chemo sessions but this improves after the session. CV exam today unremarkable. Discussed return precautions to clinic, especially if blood pressure is consistently 90-100/60s and patient experiences any lightheadedness, weakness, or feels like she may pass out/fall if she stands up too quickly or changes positions. Patient endorsed understanding Plan - Continue amlodipine  5 mg daily and lisinopril -hydrochlorothiazide  20-12.5 mg BID - Monitor for symptoms, consider only taking 1-2 antihypertensive medications rather than 3

## 2023-11-12 NOTE — Assessment & Plan Note (Addendum)
 Patient presents with worsening right wrist pain. Initially the pain was reported as right arm pain but patient state symptoms are mostly at her wrist. States it may have gotten worse after port cath placement on right side of chest, is right hand dominant and has been limited in what housework she can do due to pain. Denies any fever, injury, or trauma. On chart review the patient does have a history of bilateral carpal tunnel syndrome and bilateral wrist pain around 2019 where left wrist pain was worse than the right. Wrist pain previously thought to be related to carpal tunnel, told to wear wrist braces at night and given short course of prednisone  or joint injections. Patient's current pain not improved despite taking gabapentin  (prescribed for carpal tunnel syndrome) and tramadol  (prescribed for cancer related pain). On exam, patient was able to adduct/abduct right arm and pronate and supinate arm without pain. Tinel's sign negative. Endorsed tenderness with light palpation to right wrist joint. Right wrist did not appear more enlarged, edematous, erythematous, or warm to touch compared to left wrist. Flexion and extension of the right wrist was limited by pain. Adhesive capsulitis and right rotator cuff injury seem less likely given focus of symptoms in right wrist. Unilateral wrist pain makes RA/OA less likely but patient is at risk of degenerative changes in the joints. Low suspicion for septic joint or gout given normal outer appearance of wrist. Uric acid level WNL today. Due to tenderness with light palpation will get wrist xray to rule out acute fracture. Discussed that one of her cancer medications, Bevacizumab , can cause joint paint. Advised her to mention wrist/joint pain to Dr. Maryalice Smaller. For now, patient agreeable to conservative pain management.  Plan:  - Apply ice to the affected area as needed (patient may be more sensitive to cold due to cancer treatments) - Continue to take Tylenol  as needed -  Patient to take ibuprofen  for a few days to see if that will provide symptom relief, advised not to take for an extended amount of time - Recommended wearing a wrist splint to see if it will help with the inflammation, patient can try wearing at night - Consider a sports medicine referral, joint injections, or ultrasound/further imaging if little to no improvement with conservative management  - Return precautions provided if wrist becomes red and swollen, patient experiences fever or you any weakness in the right arm

## 2023-11-13 ENCOUNTER — Telehealth: Payer: Self-pay

## 2023-11-13 NOTE — Telephone Encounter (Signed)
 Patient states her right wrist is feeling better and that she is wearing splint and taking ibuprofen  2 to 3 times a day.  She started next round of xeloda  this morning.  Raeford Bullion RN, Congregational Nurse [phone]

## 2023-11-14 ENCOUNTER — Other Ambulatory Visit (HOSPITAL_COMMUNITY): Payer: Self-pay

## 2023-11-14 ENCOUNTER — Other Ambulatory Visit (HOSPITAL_COMMUNITY): Payer: Self-pay | Admitting: Pharmacy Technician

## 2023-11-14 NOTE — Progress Notes (Signed)
 Specialty Pharmacy Refill Coordination Note  Jillian Lutz is a 73 y.o. female contacted today regarding refills of specialty medication(s) Capecitabine  (XELODA )   Patient requested [facility] at [facility] at [place] date: 11/29/23   Medication will be filled on 11/29/23.  Spoke to Raeford Bullion, Charity fundraiser (patient's Engineer, water)

## 2023-11-15 ENCOUNTER — Other Ambulatory Visit (HOSPITAL_COMMUNITY): Payer: Self-pay

## 2023-11-15 ENCOUNTER — Other Ambulatory Visit: Payer: Self-pay

## 2023-11-17 NOTE — Progress Notes (Signed)
 Internal Medicine Clinic Attending  Case discussed with the resident at the time of the visit.  We reviewed the resident's history and exam and pertinent patient test results.  I agree with the assessment, diagnosis, and plan of care documented in the resident's note.

## 2023-11-19 ENCOUNTER — Telehealth: Payer: Self-pay

## 2023-11-19 NOTE — Telephone Encounter (Signed)
 Patient states he wrist is feeling better.  Reminded her to stop ibuprofen  and she stated she is no longer taking it.  Raeford Bullion RN, Congregational Nurse [phone]

## 2023-11-26 ENCOUNTER — Telehealth: Payer: Self-pay

## 2023-11-26 NOTE — Telephone Encounter (Signed)
 Reminded patient of appointment tomorrow with [facility] and that transportation should arrive at 9:00 am.  Raeford Bullion RN, Congregational Nurse [phone]

## 2023-11-27 ENCOUNTER — Inpatient Hospital Stay

## 2023-11-27 ENCOUNTER — Inpatient Hospital Stay: Attending: Physician Assistant | Admitting: Nurse Practitioner

## 2023-11-27 ENCOUNTER — Other Ambulatory Visit (HOSPITAL_COMMUNITY): Payer: Self-pay

## 2023-11-27 ENCOUNTER — Other Ambulatory Visit

## 2023-11-27 VITALS — BP 110/64 | HR 71 | Temp 98.1°F | Resp 17 | Wt 100.8 lb

## 2023-11-27 DIAGNOSIS — D509 Iron deficiency anemia, unspecified: Secondary | ICD-10-CM | POA: Insufficient documentation

## 2023-11-27 DIAGNOSIS — D649 Anemia, unspecified: Secondary | ICD-10-CM

## 2023-11-27 DIAGNOSIS — R630 Anorexia: Secondary | ICD-10-CM | POA: Diagnosis not present

## 2023-11-27 DIAGNOSIS — Z95828 Presence of other vascular implants and grafts: Secondary | ICD-10-CM

## 2023-11-27 DIAGNOSIS — K6289 Other specified diseases of anus and rectum: Secondary | ICD-10-CM | POA: Diagnosis not present

## 2023-11-27 DIAGNOSIS — C20 Malignant neoplasm of rectum: Secondary | ICD-10-CM | POA: Insufficient documentation

## 2023-11-27 DIAGNOSIS — K922 Gastrointestinal hemorrhage, unspecified: Secondary | ICD-10-CM | POA: Insufficient documentation

## 2023-11-27 DIAGNOSIS — Z5111 Encounter for antineoplastic chemotherapy: Secondary | ICD-10-CM | POA: Insufficient documentation

## 2023-11-27 DIAGNOSIS — C7800 Secondary malignant neoplasm of unspecified lung: Secondary | ICD-10-CM | POA: Insufficient documentation

## 2023-11-27 DIAGNOSIS — Z923 Personal history of irradiation: Secondary | ICD-10-CM | POA: Insufficient documentation

## 2023-11-27 LAB — CMP (CANCER CENTER ONLY)
ALT: <5 U/L (ref 0–44)
AST: 13 U/L — ABNORMAL LOW (ref 15–41)
Albumin: 3.4 g/dL — ABNORMAL LOW (ref 3.5–5.0)
Alkaline Phosphatase: 46 U/L (ref 38–126)
Anion gap: 5 (ref 5–15)
BUN: 16 mg/dL (ref 8–23)
CO2: 32 mmol/L (ref 22–32)
Calcium: 8.4 mg/dL — ABNORMAL LOW (ref 8.9–10.3)
Chloride: 101 mmol/L (ref 98–111)
Creatinine: 0.79 mg/dL (ref 0.44–1.00)
GFR, Estimated: >60 mL/min (ref >60)
Glucose, Bld: 114 mg/dL — ABNORMAL HIGH (ref 70–99)
Potassium: 3.5 mmol/L (ref 3.5–5.1)
Sodium: 138 mmol/L (ref 135–145)
Total Bilirubin: 0.4 mg/dL (ref 0.0–1.2)
Total Protein: 6.6 g/dL (ref 6.5–8.1)

## 2023-11-27 LAB — CBC WITH DIFFERENTIAL (CANCER CENTER ONLY)
Abs Immature Granulocytes: 0.01 10*3/uL (ref 0.00–0.07)
Basophils Absolute: 0 10*3/uL (ref 0.0–0.1)
Basophils Relative: 1 %
Eosinophils Absolute: 0.1 10*3/uL (ref 0.0–0.5)
Eosinophils Relative: 3 %
HCT: 21.3 % — ABNORMAL LOW (ref 36.0–46.0)
Hemoglobin: 7.1 g/dL — ABNORMAL LOW (ref 12.0–15.0)
Immature Granulocytes: 0 %
Lymphocytes Relative: 19 %
Lymphs Abs: 0.7 10*3/uL (ref 0.7–4.0)
MCH: 26.8 pg (ref 26.0–34.0)
MCHC: 33.3 g/dL (ref 30.0–36.0)
MCV: 80.4 fL (ref 80.0–100.0)
Monocytes Absolute: 0.5 10*3/uL (ref 0.1–1.0)
Monocytes Relative: 13 %
Neutro Abs: 2.4 10*3/uL (ref 1.7–7.7)
Neutrophils Relative %: 64 %
Platelet Count: 200 10*3/uL (ref 150–400)
RBC: 2.65 MIL/uL — ABNORMAL LOW (ref 3.87–5.11)
RDW: 20.8 % — ABNORMAL HIGH (ref 11.5–15.5)
WBC Count: 3.8 10*3/uL — ABNORMAL LOW (ref 4.0–10.5)
nRBC: 0 % (ref 0.0–0.2)

## 2023-11-27 LAB — CEA (ACCESS): CEA (CHCC): 16.78 ng/mL — ABNORMAL HIGH (ref 0.00–5.00)

## 2023-11-27 LAB — ABO/RH: ABO/RH(D): A POS

## 2023-11-27 MED ORDER — DEXTROSE 5 % IV SOLN: Freq: Once | INTRAVENOUS | Status: AC

## 2023-11-27 MED ORDER — MIRTAZAPINE 30 MG PO TABS
30.0000 mg | ORAL_TABLET | Freq: Every day | ORAL | 3 refills | Status: DC
  Filled 2023-11-27: qty 30, 30d supply, fill #0
  Filled 2023-12-24: qty 30, 30d supply, fill #1
  Filled 2024-01-28: qty 30, 30d supply, fill #2

## 2023-11-27 MED ORDER — SODIUM CHLORIDE 0.9% FLUSH
10.0000 mL | Freq: Once | INTRAVENOUS | Status: AC
  Administered 2023-11-27: 10 mL

## 2023-11-27 MED ORDER — FAMOTIDINE IN NACL 20-0.9 MG/50ML-% IV SOLN
20.0000 mg | Freq: Once | INTRAVENOUS | Status: AC
  Administered 2023-11-27: 20 mg via INTRAVENOUS
  Filled 2023-11-27: qty 50

## 2023-11-27 MED ORDER — SODIUM CHLORIDE 0.9% FLUSH
10.0000 mL | INTRAVENOUS | Status: DC | PRN
  Administered 2023-11-27: 10 mL

## 2023-11-27 MED ORDER — HEPARIN SOD (PORK) LOCK FLUSH 100 UNIT/ML IV SOLN
500.0000 [IU] | Freq: Once | INTRAVENOUS | Status: AC | PRN
  Administered 2023-11-27: 500 [IU]

## 2023-11-27 MED ORDER — OXALIPLATIN CHEMO INJECTION 100 MG/20ML
70.0000 mg/m2 | Freq: Once | INTRAVENOUS | Status: AC
  Administered 2023-11-27: 100 mg via INTRAVENOUS
  Filled 2023-11-27: qty 20

## 2023-11-27 MED ORDER — DIPHENHYDRAMINE HCL 50 MG/ML IJ SOLN
25.0000 mg | Freq: Once | INTRAMUSCULAR | Status: AC
  Administered 2023-11-27: 25 mg via INTRAVENOUS
  Filled 2023-11-27: qty 1

## 2023-11-27 MED ORDER — TRAMADOL HCL 50 MG PO TABS
50.0000 mg | ORAL_TABLET | Freq: Four times a day (QID) | ORAL | 0 refills | Status: DC | PRN
  Filled 2023-11-27: qty 90, 23d supply, fill #0

## 2023-11-27 MED ORDER — DEXAMETHASONE SODIUM PHOSPHATE 10 MG/ML IJ SOLN
10.0000 mg | Freq: Once | INTRAMUSCULAR | Status: AC
  Administered 2023-11-27: 10 mg via INTRAVENOUS
  Filled 2023-11-27: qty 1

## 2023-11-27 MED ORDER — PALONOSETRON HCL INJECTION 0.25 MG/5ML
0.2500 mg | Freq: Once | INTRAVENOUS | Status: AC
  Administered 2023-11-27: 0.25 mg via INTRAVENOUS
  Filled 2023-11-27: qty 5

## 2023-11-27 NOTE — Congregational Nurse Program (Signed)
 Picked up tramadol  and mirtazapine  from [employer] and  will deliver to patient tomorrow.  Contacted [facility] Medicaid transportation and scheduled rides for the following appointments: 1) 11/29/2023 9:00 am with [facility]  2) 05/019/2025 9;30 am [facility] 3) 12/06/2023 [facility].  Raeford Bullion RN, Congregational Nurse [phone]

## 2023-11-27 NOTE — Progress Notes (Addendum)
 Patient Care Team: Sheree Dieter, MD as PCP - General Berna Breslow, RN (Inactive) as Oncology Nurse Navigator Burton, [name] K, NP as Nurse Practitioner (Nurse Practitioner) Sonja Clifton, MD as Consulting Physician (Hematology and Oncology) Johna Myers, MD as Consulting Physician (Radiation Oncology) [name], Albino Alu., MD as Consulting Physician (Gastroenterology) Thelda Finney Lucie Ruts, DO (Inactive) as Consulting Physician (Pulmonary Disease) Candyce Champagne, MD as Consulting Physician (General Surgery)  Clinic Day:  11/27/2023  Referring physician: Sonja [place], MD  ASSESSMENT & PLAN:   Assessment & Plan: 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 -Despite strong recommendation, patient firmly and repeatedly declined IV chemo and surgery. She agreed to chemoRT with Xeloda , and received the treatment 01/04/21 - 02/14/21. -she developed local cancer progression and lung mets in 11/2021 -she started Xeloda  on 02/16/2022, low dose oxaliplatin  was added on 05/11/22 (with cycle 4 of Xeloda )  -chemo held since 07/2022 due to leg pain and recurrent nausea  -restaging CT 09/07/2022 showed stable disease in rectum and lung, no new lesions.   -she had chemo break from Feb-May 2024 -restart CAPOX with dose reduction 12/18/2022, not tolerating very well overall.  Oxaliplatin  was held on August 5. She is on oxaliplatin  every 6 weeks now.  -Restaging CT scan from 07/16/2023 showed stable rectal wall thickening, no other  evidence of metastasis  -10/09/2023 - restaging CT CAP showed overall stable disease. Concern for rectal tumor causing obstruction. Patient advised to contact cancer center if she goes more than 3 to 4 days without bowel movement. May need surgical referral for ostomy if obstruction does occur as she has  already had radiation to the mass. She did voice understanding. Today, she reports improvement in bowel movements, though continues to have rectal pain with them. Having them nearly every day with soft stools.  --offered to increase oxaliplatin  to every 3 weeks. She prefers to keep this at every 6 weeks.  - 11/27/2023 -bevacizumab  held today due to recent history of bleeding.  Patient with significant decrease in hemoglobin to 7.1.  Proceed with oxaliplatin  as scheduled.  Reach out to Dr. Leonia Raman, GI to see if she can offer patient EGD and sigmoidoscopy to evaluate for new GI bleed and visualization of rectal tumor.  Patient to receive blood transfusion and IV iron over next several days to improve blood counts.  She has agreed to receive blood transfusion. -For now, plan to see patient back for labs, flush, follow-up, and next treatment in 3 weeks.   Iron deficiency anemia Likely from lower GI bleeding.  Hemoglobin 7.1 and hematocrit 21.3.  Arrangements made to transfuse 1 unit RBCs on 11/29/2023.  Will also receive 400 mg IV Venofer on 12/03/2023.  Hold bevacizumab  today.  Proceed with oxaliplatin .  Reach out to Dr. Leonia Raman, GI.  She performed patient's most recent colonoscopy in 2023.  Will see if she is able to offer the patient sigmoidoscopy and possible EGD due to recent drop in blood counts.  Rectal pain Per patient, rectal pain has nearly resolved since rectal bleeding started.  She is taking tramadol  twice daily to help with pain control.  Will provide a refill for this today.  Decreased appetite Patient has maintained current weight despite decreased appetite.  Refill mirtazapine  30 mg every evening.  Plan The patient was seen with Dr. Maryalice Smaller today. Labs reviewed.  -Significant drop in Hgb  since last check 3 weeks ago.  Today Hgb 7.1. -- Hold bevacizumab  today.  Arrange for 1 unit RBCs to be given on 11/29/2023.  Arrange for 400 mg IV Venofer on 12/03/2023. -- Message sent to Dr.Nandigam, GI.   Will see if she is able to offer patient a sigmoidoscopy to view patient's rectal tumor, and possible EGD to evaluate for evidence of new GI bleeding. Proceed with oxaliplatin  today. Labs/flush, follow-up, and next treatment as scheduled.  The patient understands the plans discussed today and is in agreement with them.  She knows to contact our office if she develops concerns prior to her next appointment.  I provided 30 minutes of face-to-face time during this encounter and > 50% was spent counseling as documented under my assessment and plan.    Sharyon Deis, NP  [facility] - A DEPT OF [facility] [address] [place] [address] Dept: [phone] Dept Fax: [phone]   Orders Placed This Encounter  Procedures   Informed Consent Details: Physician/Practitioner Attestation; Transcribe to consent form and obtain patient signature    Standing Status:   Future    Expected Date:   11/29/2023    Expiration Date:   11/26/2024    Physician/Practitioner attestation of informed consent for blood and or blood product transfusion:   I, the physician/practitioner, attest that I have discussed with the patient the benefits, risks, side effects, alternatives, likelihood of achieving goals and potential problems during recovery for the procedure that I have provided informed consent.    Product(s):   All Product(s)   Care order/instruction    Transfuse Parameters    Standing Status:   Future    Expiration Date:   11/26/2024   Type and screen         Standing Status:   Future    Number of Occurrences:   1    Expiration Date:   11/26/2024   ABO/RH    Standing Status:   Future    Number of Occurrences:   1    Expected Date:   11/27/2023    Expiration Date:   11/26/2024      CHIEF COMPLAINT:  CC: Rectal adenocarcinoma but still  Current Treatment: CapeOx and bevacizumab  every 3 weeks with oxaliplatin  every 6  weeks  INTERVAL HISTORY:  Alix is here today for repeat clinical assessment.  She was last seen by myself on 11/05/2023.  Today, is cycle 15-day 1.  She will receive oxaliplatin  and bevacizumab .  Takes Xeloda  1000 mg twice daily for 14 days followed by 7 days break. Was having considerable rectal pain at most recent visit. Has been considering referral to surgery, but continues to hold off acutal referral.  Today, patient states that she has had significant rectal bleeding.  Started on 11/12/2023.  Last episode of bleeding was yesterday.  She states this was happening with each time she had bowel movement.  Denies abdominal pain.  No longer complaining of rectal pain.  She states when she was seen on 11/05/2023, she was not having this concern and waited until today's visit to discuss with provider.  She denies chest pain, chest pressure, or shortness of breath. She denies headaches or visual disturbances. She denies fevers or chills. She denies pain. Her appetite is poor. Her weight has been stable.  I have reviewed the past medical history, past surgical history, social history and family history with the patient and they  are unchanged from previous note.  ALLERGIES:  is allergic to no known allergies.  MEDICATIONS:  Current Outpatient Medications  Medication Sig Dispense Refill   amLODipine  (NORVASC ) 5 MG tablet Take 1 tablet (5 mg total) by mouth daily. 90 tablet 3   brimonidine  (ALPHAGAN ) 0.2 % ophthalmic solution Place 1 drop into the right eye 2 (two) times daily. 15 mL 3   brimonidine  (ALPHAGAN ) 0.2 % ophthalmic solution Place 1 drop into the right eye 2 (two) times daily. 10 mL 11   capecitabine  (XELODA ) 500 MG tablet Take 2 tabs every 12 hours, for 14 days then off for 7 days. Take 30mins after meal 56 tablet 2   dexamethasone  (DECADRON ) 4 MG tablet Take 1 tablet (4 mg total) by mouth daily. TAKE DAILY FOR 3-5 DAYS AFTER IV CHEMO 20 tablet 1   dorzolamide -timolol  (COSOPT ) 2-0.5 %  ophthalmic solution Place 1 drop into both eyes 2 times daily. 30 mL 3   dorzolamide -timolol  (COSOPT ) 2-0.5 % ophthalmic solution Instill 1 drop into both eyes twice a day 10 mL 11   gabapentin  (NEURONTIN ) 100 MG capsule Take 1 capsule (100 mg total) by mouth 3 (three) times daily as needed. 90 capsule 2   lidocaine  (XYLOCAINE ) 5 % ointment Apply to anal area daily as needed. 50 g 1   lidocaine -prilocaine  (EMLA ) cream Apply topically daily as needed. 30 g 3   lisinopril -hydrochlorothiazide  (ZESTORETIC ) 20-12.5 MG tablet Take 2 tablets by mouth daily. 60 tablet 3   mirtazapine  (REMERON ) 30 MG tablet Take 1 tablet (30 mg total) by mouth at bedtime. 30 tablet 3   Multiple Vitamins-Minerals (MULTIVITAMIN WITH MINERALS) tablet Take 1 tablet by mouth daily. Gummy 50 mg     ondansetron  (ZOFRAN ) 8 MG tablet Take 1 tablet (8 mg total) by mouth every 8 (eight) hours as needed for nausea or vomiting. Take after 3 days of chemo 30 tablet 2   polyethylene glycol powder (GLYCOLAX/MIRALAX) 17 GM/SCOOP powder Take 1 Container by mouth once.     potassium chloride  (KLOR-CON  M) 10 MEQ tablet Take 1 tablet (10 mEq total) by mouth 2 (two) times daily. 60 tablet 2   prochlorperazine  (COMPAZINE ) 10 MG tablet Take 1 tablet (10 mg total) by mouth every 6 (six) hours as needed. 30 tablet 2   rivaroxaban  (XARELTO ) 20 MG TABS tablet Take 1 tablet (20 mg total) by mouth daily with supper. 30 tablet 2   traMADol  (ULTRAM ) 50 MG tablet Take 1 tablet (50 mg total) by mouth every 6 (six) hours as needed. 90 tablet 0   urea  (CARMOL) 10 % cream Apply topically 2 times daily as needed. 85 g 3   No current facility-administered medications for this visit.   Facility-Administered Medications Ordered in Other Visits  Medication Dose Route Frequency Provider Last Rate Last Admin   sodium chloride  flush (NS) 0.9 % injection 10 mL  10 mL Intracatheter PRN Sonja Red Level, MD   10 mL at 11/27/23 1444    HISTORY OF PRESENT ILLNESS:    Oncology History Overview Note  Cancer Staging Rectal adenocarcinoma [facility]) Staging form: Colon and Rectum, AJCC 8th Edition - Clinical stage from 12/21/2020: Stage IIIB (cT3, cN1, cM0) - Unsigned    Rectal adenocarcinoma (HCC)  08/26/2020 Miscellaneous   Initial presentation to PCP, reporting intermittent BRBPR since 02/2020    12/01/2020 Procedure   Colonoscopy by Dr. Leonia Raman findings - The perianal and digital rectal examinations were normal. - A 15 mm polyp was found in the ascending colon.  The polyp was semi-pedunculated. Resection and retrieval were complete. - A 25 mm polyp was found in the sigmoid colon. The polyp was pedunculated. Resection and retrieval were complete. - An infiltrative partially obstructing large mass was found in the proximal rectum. The mass was partially circumferential (involving one-half of the lumen circumference). The mass measured eight cm in length extending from 10 to 18cm from anal verge. This was biopsied with a cold forceps for histology. Proximal and distal opposite fold area of the mass lesion was tattooed with an injection of total 3 mL of Spot (carbon black).   12/01/2020 Initial Biopsy   Diagnosis 1. Colon, polyp(s), ascending x 1 - ADENOCARCINOMA ARISING IN A TUBULAR ADENOMA WITH HIGH-GRADE DYSPLASIA. SEE NOTE 2. Colon, sigmoid polyp, x1 - TUBULOVILLOUS ADENOMA(S) - NEGATIVE FOR HIGH-GRADE DYSPLASIA OR MALIGNANCY 3. Rectum, biopsy - ADENOCARCINOMA. SEE NOTE   12/01/2020 Cancer Staging   Cancer Staging Rectal adenocarcinoma [facility]) Staging form: Colon and Rectum, AJCC 8th Edition - Clinical stage from 12/21/2020: Stage IIIB (cT3, cN1, cM0) - Unsigned    12/06/2020 Imaging   CT CAP with contrast IMPRESSION: 1. There is partially circumferential soft tissue thickening of the mid to superior rectum, approximately 5 cm in length and the inferior extent approximately 6 cm above the anal verge, consistent with primary rectal malignancy identified  by colonoscopy. 2. There appear to be abnormally enlarged perirectal lymph nodes posteriorly about the superior rectum measuring up to 1.0 x 0.8 cm. Findings are suspicious for perirectal nodal metastatic disease, however rectal MRI is the test of choice for initial local staging of rectal cancer. 3. There is a 4 mm nonspecific pulmonary nodule of the superior segment left lower lobe, statistically most likely incidental, infectious or inflammatory, although nonspecific and isolated metastatic disease is not strictly excluded. Attention on follow-up. 4. No other evidence of metastatic disease in the chest, abdomen, or pelvis. Aortic Atherosclerosis (ICD10-I70.0).   12/09/2020 Imaging   Local staging MRI pelvis without contrast IMPRESSION: 4.9 cm circumferential mid/lower rectal mass, corresponding to the patient's newly diagnosed rectal cancer. Rectal adenocarcinoma T stage: T3c Rectal adenocarcinoma N stage:  N1 Distance from tumor to the internal anal sphincter is 3.7 cm.   12/21/2020 Initial Diagnosis   Rectal adenocarcinoma (HCC)   01/04/2021 -  Chemotherapy   Concurrent chemoradiation with Xeloda  1000mg  in the AM and 1500mg  in the PM on days of Radiation starting 01/04/21.       01/04/2021 - 02/11/2021 Radiation Therapy   Concurrent chemoradiation with Dr Jeryl Moris and Xeloda  starting 01/04/21.    01/31/2022 Procedure   Colonoscopy, Dr. Leonia Raman  Findings: - An infiltrative partially obstructing large mass was found in the rectum. The mass was circumferential. The mass measured ten cm in length, extending from 5-15cm from anal verge. In addition, rectal diameter at narrowest approx twelve mm. Oozing was present. Biopsies were taken with a cold forceps for histology.  Impression: - Hemorrhoids found on perianal exam. - Likely malignant partially obstructing tumor in the rectum. Biopsied. - Non-bleeding external and internal hemorrhoids.   01/31/2022 Pathology Results    Diagnosis Rectum, biopsy INVASIVE MODERATELY DIFFERENTIATED ADENOCARCINOMA   05/11/2022 -  Chemotherapy   Patient is on Treatment Plan : COLORECTAL Xelox (Capeox)(130/850) q21d     09/07/2022 Imaging    IMPRESSION: 1. No significant change in circumferential wall thickening of the rectum, in keeping with known primary rectal mass. Similar appearance of post treatment perirectal and presacral fat stranding and fascial thickening 2. Unchanged nodule of  the superior segment left lower lobe with adjacent bandlike scarring. No new nodules. 3. No evidence of lymphadenopathy or other metastatic disease in the chest, abdomen, or pelvis. 4. Large burden of stool throughout the colon. 5. Cardiomegaly.   12/08/2022 Imaging    IMPRESSION: Persistent wall thickening along the rectum with adjacent severe inflammatory stranding and nodularity. Please correlate with exact location of the distribution of the patient's neoplasm.   No discrete new mass lesion, fluid collection or lymph node enlargement.   Improved visualization of the fractures involving the left side of the pubic symphysis as well as probable insufficiency fractures along the sacrum. Associated areas of increasing heterogeneous bony sclerosis along the pubic bones and sacrum. In principle sclerotic bone metastases would be in the differential but are felt to be less likely based on the overall appearance. If needed confirmatory MRI can be considered as clinically appropriate to further delineate.   Stable scarring and fibrotic changes along the lungs with a tiny nodular area along the superior segment of the left lower lobe. Simple continued follow up.   Enlarged heart.   10/09/2023 Imaging   CT chest abdomen and pelvis with contrast  MPRESSION: 1. Persistent circumferential wall thickening in the rectum, not substantially changed taking into account less rectal distention on the current study. 2. Marked stool volume  throughout the colon proximal to the rectum, similar to minimally progressive in the interval. While this may reflect clinical constipation, a degree of stricture related to the rectal neoplasm cannot be excluded. 3. No findings on the current study to suggest progressive or metastatic disease. 4. Heterogeneous mineralization of the sacrum, as before. Nonspecific by CT, features may reflect old trauma.       REVIEW OF SYSTEMS:   Constitutional: Denies fevers, chills or abnormal weight loss.  Decreased appetite Eyes: Denies blurriness of vision Ears, nose, mouth, throat, and face: Denies mucositis or sore throat Respiratory: Denies cough, dyspnea or wheezes Cardiovascular: Denies palpitation, chest discomfort or lower extremity swelling Gastrointestinal:  Denies nausea, heartburn or change in bowel habits.  For past 2 weeks, was having rectal bleeding with each bowel movement.  Last episode of rectal bleeding was yesterday. Skin: Denies abnormal skin rashes Lymphatics: Denies new lymphadenopathy or easy bruising Neurological:Denies numbness, tingling or new weaknesses Behavioral/Psych: Mood is stable, no new changes  All other systems were reviewed with the patient and are negative.   VITALS:   Today's Vitals   11/27/23 1017  BP: 110/64  Pulse: 71  Resp: 17  Temp: 98.1 F (36.7 C)  SpO2: 99%  Weight: 100 lb 12.8 oz (45.7 kg)   Body mass index is 21.07 kg/m.   Wt Readings from Last 3 Encounters:  11/27/23 100 lb 12.8 oz (45.7 kg)  11/08/23 100 lb 11.2 oz (45.7 kg)  11/05/23 99 lb 6.4 oz (45.1 kg)    Body mass index is 21.07 kg/m.  Performance status (ECOG): 1 - Symptomatic but completely ambulatory  PHYSICAL EXAM:   GENERAL:alert, no distress and comfortable SKIN: skin color, texture, turgor are normal, no rashes or significant lesions EYES: normal, Conjunctiva are pink and non-injected, sclera clear OROPHARYNX:no exudate, no erythema and lips, buccal mucosa, and  tongue normal  NECK: supple, thyroid normal size, non-tender, without nodularity LYMPH:  no palpable lymphadenopathy in the cervical, axillary or inguinal LUNGS: clear to auscultation and percussion with normal breathing effort HEART: regular rate & rhythm and no murmurs and no lower extremity edema ABDOMEN:abdomen soft, non-tender and normal bowel sounds  Musculoskeletal:no cyanosis of digits and no clubbing  NEURO: alert & oriented x 3 with fluent speech, no focal motor/sensory deficits  LABORATORY DATA:  I have reviewed the data as listed    Component Value Date/Time   NA 138 11/27/2023 0942   NA 139 09/29/2019 1406   K 3.5 11/27/2023 0942   CL 101 11/27/2023 0942   CO2 32 11/27/2023 0942   GLUCOSE 114 (H) 11/27/2023 0942   BUN 16 11/27/2023 0942   BUN 12 09/29/2019 1406   CREATININE 0.79 11/27/2023 0942   CALCIUM 8.4 (L) 11/27/2023 0942   PROT 6.6 11/27/2023 0942   PROT 6.6 06/22/2016 1004   ALBUMIN 3.4 (L) 11/27/2023 0942   ALBUMIN 4.1 06/22/2016 1004   AST 13 (L) 11/27/2023 0942   ALT <5 11/27/2023 0942   ALKPHOS 46 11/27/2023 0942   BILITOT 0.4 11/27/2023 0942   GFRNONAA >60 11/27/2023 0942   GFRAA 88 09/29/2019 1406     RADIOGRAPHIC STUDIES: DG Wrist Complete Right Result Date: 11/15/2023 CLINICAL DATA:  Acute right wrist pain. EXAM: RIGHT WRIST - COMPLETE 3+ VIEW COMPARISON:  None Available. FINDINGS: Mildly decreased bone mineralization. Neutral ulnar variance or minimal 1 mm ulnar positive variance. Minimal thumb carpometacarpal joint space narrowing and peripheral osteophytosis. Minimal peripheral triscaphe osteophytosis. Mild thumb interphalangeal joint space narrowing and peripheral spurring. No acute fracture or dislocation. 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 Electronically Signed   By: Bertina Broccoli M.D.   On: 11/15/2023 09:34   Addendum I have seen the patient, examined her. I agree with the assessment and  and plan and have edited the notes.   Ms Deckert had a significant lower GI bleeding in the past 2 weeks, which dropped her hemoglobin from 9.4-7.1 today, arrange blood transfusion.  This is likely related to her rectal cancer bleeding.  Will send a message to GI Dr.  to see if she needs sigmoidoscopy.  She otherwise is stable, will proceed chemo oxaliplatin  today.  Will hold bevacizumab  due to recent bleeding.  She will continue Xeloda  at home.  Follow-up in 3 weeks. Will repeat tumor marker.  Sonja Henderson MD 11/27/2023

## 2023-11-27 NOTE — Patient Instructions (Signed)
 [facility] - A DEPT OF [facility]  Discharge Instructions: Thank you for choosing [facility] to provide your oncology and hematology care.   If you have a lab appointment with the Cancer Center, please go directly to the Cancer Center and check in at the registration area.   Wear comfortable clothing and clothing appropriate for easy access to any Portacath or PICC line.   We strive to give you quality time with your provider. You may need to reschedule your appointment if you arrive late (15 or more minutes).  Arriving late affects you and other patients whose appointments are after yours.  Also, if you miss three or more appointments without notifying the office, you may be dismissed from the clinic at the provider's discretion.      For prescription refill requests, have your pharmacy contact our office and allow 72 hours for refills to be completed.    Today you received the following chemotherapy and/or immunotherapy agents: Oxaliplatin       To help prevent nausea and vomiting after your treatment, we encourage you to take your nausea medication as directed.  BELOW ARE SYMPTOMS THAT SHOULD BE REPORTED IMMEDIATELY: *FEVER GREATER THAN 100.4 F (38 C) OR HIGHER *CHILLS OR SWEATING *NAUSEA AND VOMITING THAT IS NOT CONTROLLED WITH YOUR NAUSEA MEDICATION *UNUSUAL SHORTNESS OF BREATH *UNUSUAL BRUISING OR BLEEDING *URINARY PROBLEMS (pain or burning when urinating, or frequent urination) *BOWEL PROBLEMS (unusual diarrhea, constipation, pain near the anus) TENDERNESS IN MOUTH AND THROAT WITH OR WITHOUT PRESENCE OF ULCERS (sore throat, sores in mouth, or a toothache) UNUSUAL RASH, SWELLING OR PAIN  UNUSUAL VAGINAL DISCHARGE OR ITCHING   Items with * indicate a potential emergency and should be followed up as soon as possible or go to the Emergency Department if any problems should occur.  Please show the CHEMOTHERAPY ALERT CARD or  IMMUNOTHERAPY ALERT CARD at check-in to the Emergency Department and triage nurse.  Should you have questions after your visit or need to cancel or reschedule your appointment, please contact [facility] - A DEPT OF [facility]  Dept: [phone]  and follow the prompts.  Office hours are 8:00 a.m. to 4:30 p.m. Monday - Friday. Please note that voicemails left after 4:00 p.m. may not be returned until the following business day.  We are closed weekends and major holidays. You have access to a nurse at all times for urgent questions. Please call the main number to the clinic Dept: [phone] and follow the prompts.   For any non-urgent questions, you may also contact your provider using MyChart. We now offer e-Visits for anyone 6 and older to request care online for non-urgent symptoms. For details visit [email or web address].   Also download the MyChart app! Go to the app store, search "MyChart", open the app, select [facility], and log in with your MyChart username and password.  Bevacizumab  Injection ?y l thu?c g? BEVACIZUMAB  (??c l be va SIZ yoo mab) ?i?u tr? m?t s? lo?i ung th?. Thu?c ny tc d?ng b?ng cch ng?n ch?n m?t lo?i protein khi?n t? bo ung th? pht tri?n v nhn ln. ?i?u ny gip lm ch?m ho?c ng?n ch?n s? ly lan c?a cc t? bo ung th?. ?y l khng th? ??n dng. Thu?c ny c th? ???c dng cho nh?ng m?c ?ch khc; hy h?i ng??i cung c?p d?ch v? y t? ho?c d??c s? c?a mnh, n?u qu  v? c th?c m?c. (CC) NHN HI?U PH? BI?N: Alymsys , Avastin , MVASI , Vegzalma, Zirabev  Ti c?n ph?i bo cho ng??i cung c?p d?ch v? y t? c?a mnh ?i?u g tr??c khi dng thu?c ny? H? c?n bi?t li?u qu v? hi?n c b?t k? tnh tr?ng no sau ?y hay khng: C?c mu ?ng Ho ra mu ?ang gi?i ph?u ho?c gi?i ph?u m?i ?y Suy tim Huy?t a?p cao Ti?n s? c ch? thng gi?a 2 ho?c nhi?u b? ph?n c? th? th??ng khng thng nhau (l? r) Ti?n s? rch d? dy ho?c ru?t C protein  trong n??c ti?u c?a qu v? Pha?n ??ng b?t th???ng ho??c ph?n ?ng di? ??ng v??i bevacizumab , ca?c d??c ph?m kha?c, th?c ph?m, thu?c nhu?mu, ho??c ch?t ba?o qua?n ?ang c thai ho??c ??nh co? thai ?ang cho con bu? Ti nn s? d?ng thu?c ny nh? th? no? Thu?c ny ?? tim vo t?nh m?ch. Thu?c ny ???c ??i ng? ch?m [place] c?a qu v? cho dng t?i b?nh vi?n ho?c phng m?ch. Hy h?i ??i ng? ch?m [place] c?a qu v? v? vi?c dng thu?c ny ? tr? em. C th? c?n ch?m [place] ??c bi?t. Qu li?u: N?u qu v? cho r?ng mnh ? dng qu nhi?u thu?c ny, th hy lin l?c v?i trung tm ki?m sot ch?t ??c ho?c phng c?p c?u ngay l?p t?c.<br />L?U : Thu?c ny ch? dnh ring cho qu v?. Khng chia s? thu?c ny v?i nh?ng ng??i khc. N?u ti l? qun m?t li?u th sao? Hy ??n cc cu?c h?n khm ?? nh?n cc li?u thu?c ti?p theo. ?i?u quan tr?ng l khng nn b? l? li?u thu?c c?a qu v?. Hy g?i cho ??i ng? ch?m [place] c?a mnh n?u qu v? khng th? ??n m?t cu?c h?n khm. Nh?ng g c th? t??ng tc v?i thu?c ny? Cc t??ng tc v?i thu?c khng x?y ra. Danh sch ny c th? khng m t? ?? h?t cc t??ng tc c th? x?y ra. Hy ??a cho ng??i cung c?p d?ch v? y t? c?a mnh danh sch t?t c? cc thu?c, th?o d??c, cc thu?c khng c?n toa, ho?c cc ch? ph?m b? sung m qu v? dng. C?ng nn bo cho h? bi?t r?ng qu v? c ht thu?c, u?ng r??u, ho?c c s? d?ng ma ty tri php hay khng. Vi th? c th? t??ng tc v?i thu?c c?a qu v?. Ti c?n ph?i theo di ?i?u g trong khi dng thu?c ny? Tnh tr?ng c?a qu v? se? ????c theo do?i c?n th?n trong khi qu v? ?ang du?ng thu?c na?y. Qu v? c th? c?n ?i lm xt nghi?m mu trong khi ?ang dng thu?c ny. Thu?c ny c th? lm cho qu v? c?m th?y khng ???c kh?e nh? th??ng l?. ?i?u ny khng ph?i l hi?m g?p, v thu?c ha tr? li?u c th? tc ??ng ln t? bo lnh l?n t? bo ung th?. Hy t??ng trnh m?i tc d?ng ph?. Hy ti?p t?c ??t ?i?u tr? c?a mnh ngay c? khi qu v? c?m th?y m?t, tr? khi ??i ng? ch?m [place] yu  c?u qu v? ng?ng ?i?u tr?. Thu?c ny c th? lm t?ng nguy c? b? b?m tm ho?c ch?y mu. Hy g?i cho ??i ng? ch?m [place], n?u qu v? ??  th?y b?t k? tnh tr?ng ch?y mu b?t th??ng no. Tr??c khi gi?i ph?u, hy bn b?c v?i ??i ng? ch?m [place] c?a qu v? ?? ??m b?o c th? ti?n hnh gi?i ph?u. Thu?c ny c th? lm t?ng nguy c? v?t  th??ng ho?c v?t m? ch?m lnh. Qu v? s? c?n ng?ng dng thu?c ny 28 ngy tr??c khi gi?i ph?u. Sau khi gi?i ph?u, hy ??i t nh?t 28 ngy tr??c khi b?t ??u dng l?i thu?c ny. ??m b?o v?t m? ho?c v?t th??ng ? ?? lnh tr??c khi b?t ??u dng l?i thu?c ny. Hy h?i ??i ng? ch?m [place] c?a mnh, n?u c th?c m?c. Hy h?i ??i ng? ch?m [place] c?a mnh, n?u qu v? c [place]. Thu?c ny c th? gy d? t?t b?m sinh nghim tr?ng n?u dng trong [place] k? v trong 6 thng sau li?u thu?c cu?i cng. Nn trnh [place] trong khi dng thu?c ny v trong 6 thng sau li?u thu?c cu?i cng. ??i ng? ch?m [place] c th? gip qu v? tm ra l?a ch?n ph h?p v?i qu v?. Khng ???c cho tr? b m? trong khi dng thu?c ny v trong vng 6 thng sau khi dng li?u thu?c cu?i cng. Thu?c ny c th? gy v sinh. Hy th?o lu?n v?i ??i ng? ch?m [place] c?a mnh, n?u qu v? lo l??ng v? kha? n?ng sinh s?n c?a mnh. Ti c th? nh?n th?y nh?ng tc d?ng ph? no khi dng thu?c ny? Nh?ng tc d?ng ph? qu v? c?n ph?i bo cho ??i ng? ch?m [place] cng s?m cng t?t: Cc ph?n ?ng d? ?ng -- da b? n?i ban, ng?a, n?i my ?ay, s?ng ? m?t, mi, l??i, ho?c h?ng Xu?t huy?t -- phn c mu ho?c mu ?en, mu h?c n, i ra mu ho?c ch?t mu nu gi?ng b c ph, n??c ti?u mu ?? ho?c nu s?m, cc ??m nh? mu ?? ho?c tm trn da, b?m tm ho?c ch?y mu b?t th??ng C?c mu ?ng -- ?au, s?ng ho?c nng ? chn, kh th?, ?au ng?c Nh?i mu c? tim -- ?au ho?c t?c ? ng?c, vai, cnh tay ho?c hm, bu?n i, kh th?, da l?nh ho?c ?m ??t, c?m th?y y?u ?t ho?c chong vng Suy tim -- kh th?, s?ng m?t c chn, bn chn ho?c bn tay, t?ng cn ??t ng?t, y?u ?t ho?c m?t m?i khc th??ng T?ng  huy?t p Nhi?m trng -- s?t, ?n l?nh, ho, ?au h?ng, v?t th??ng khng lnh, ?au khi ?i ti?u ho?c kh ?i ti?u, c?m gic kh ch?u chung ho?c c?m th?y khng kh?e Ph?n ?ng truy?n d?ch -- ?au ng?c, kh th? ho?c kh th?, c?m th?y y?u ?t ho?c chong vng T?n th??ng th?n -- gi?m l??ng n??c ti?u, s?ng ? m?t c chn, bn tay ho?c bn chn ?au bao t? d? d?i, khng h?t ho?c tr? nn t?i t? h?n ??t qu? -- ??t ng?t b? t ho?c y?u ? m?t, cnh tay ho?c chn, ni kh, l l?n, kh ?i l?i, m?t th?ng b?ng ho?c ph?i h?p ??ng tc, chng m?t, ?au ??u d? d?i, thay ??i th? l?c ?au ??u ??t ng?t v d? d?i, l l?n, thay ??i th? l?c, co gi?t, c th? l d?u hi?u c?a h?i ch?ng b?nh no sau c h?i ph?c (PRES) Cc tc d?ng ph? th??ng khng c?n ph?i ch?m [place] y t? (hy bo cho ??i ng? ch?m  n?u cc tc d?ng ph? ny ti?p di?n ho?c gy phi?n toi): ?au l?ng Thay ??i v? gic Tiu ch?y Da kh Ch?y nhi?u n??c m?t Ch?y mu cam Danh sch ny c th? khng m t? ?? h?t cc tc d?ng ph? c th? x?y ra. Xin g?i t?i bc s? c?a mnh ?? ???c c? v?n chuyn mn v? cc tc d?ng  ph?. Qu v? c th? t??ng trnh cc tc d?ng ph? cho FDA theo s? [phone]. Ti nn c?t gi? thu?c c?a mnh ? ?u? Thu?c ny th??ng ???c cho dng t?i b?nh vi?n ho?c phng m?ch. Thu?c ny khng ???c c?t gi? t?i nh. L?U : ?y l b?n tm t?t. N c th? khng bao hm t?t c? thng tin c th? c. N?u qu v? th?c m?c v? thu?c ny, xin trao ??i v?i bc s?, d??c s?, ho?c ng??i cung c?p d?ch v? y t? c?a mnh.  2024 Elsevier/Gold Standard (2022-05-24 00:00:00)  Oxaliplatin  Injection ?y l thu?c g? OXALIPLATIN  (??c l ox AL i PLA tin) ?i?u tr? ung th? ??i tr?c trng. Thu?c ny tc d?ng b?ng cch lm ch?m s? pht tri?n c?a cc t? bo ung th?. Thu?c ny c th? ???c dng cho nh?ng m?c ?ch khc; hy h?i ng??i cung c?p d?ch v? y t? ho?c d??c s? c?a mnh, n?u qu v? c th?c m?c. (CC) NHN HI?U PH? BI?N: [place]  Ti c?n ph?i bo cho ng??i cung c?p d?ch v? y t? c?a mnh ?i?u g  tr??c khi dng thu?c ny? H? c?n bi?t li?u qu v? hi?n c b?t k? tnh tr?ng no sau ?y hay khng: B?nh tim Ti?n s? nh?p tim ho?c tim ??p khng ??u B?nh gan C s? l??ng t? bo mu th?p (s? l??ng b?ch c?u, h?ng c?u ho?c ti?u c?u) B?nh ph?i ho??c h h?p, ch??ng ha?n nh? hen suy?n Dng cc thu?c ?? ?i?u tr? ho?c phng ng?a c?c mu ?ng C?m gic ?au nhi ? ngn tay, ngn chn ho?c cc r?i lo?n th?n kinh khc Pha?n ??ng b?t th???ng ho??c ph?n ?ng di? ??ng v??i oxaliplatin , ca?c lo?i thu?c kha?c, th?c ph?m, thu?c nhu?m, ho??c ch?t ba?o qua?n N?u qu v? ho?c b?n tnh c?a qu v? ?ang c thai ho?c c k? ho?ch mang thai ?ang cho con bu? Ti nn s? d?ng thu?c ny nh? th? no? Thu?c ny ?? tim vo t?nh m?ch. Thu?c ny ???c ??i ng? ch?m [place] c?a qu v? cho dng t?i b?nh vi?n ho?c phng m?ch. Hy bn v?i ??i ng? ch?m [place] c?a qu v? v? vi?c dng thu?c ny ? tr? em. C th? c?n ch?m [place] ??c bi?t. Qu li?u: N?u qu v? cho r?ng mnh ? dng qu nhi?u thu?c ny, th hy lin l?c v?i trung tm ki?m sot ch?t ??c ho?c phng c?p c?u ngay l?p t?c.<br />L?U : Thu?c ny ch? dnh ring cho qu v?. Khng chia s? thu?c ny v?i nh?ng ng??i khc. N?u ti l? qun m?t li?u th sao? Hy ??n cc cu?c h?n khm ?? nh?n cc li?u thu?c ti?p theo. ?i?u quan tr?ng l khng nn b? l? li?u thu?c no. Hy g?i cho ??i ng? ch?m [place] c?a mnh n?u qu v? khng th? ??n m?t cu?c h?n khm. Nh?ng g c th? t??ng tc v?i thu?c ny? Khng ???c dng thu?c ny cng v?i b?t k? th? no sau ?y: Cisapride Dronedarone Pimozide Thioridazine Thu?c ny c?ng c th? t??ng tc v?i cc thu?c sau ?y: Aspirin v cc thu?c gi?ng aspirin M?t s? thu?c ?i?u tr? ho?c phng ng?a c?c mu ?ng, ch?ng h?n nh? warfarin, apixaban, dabigatran, v rivaroxaban  Cisplatin Cyclosporine Cc thu?c l?i ti?u Cc thu?c dng ?? tr? b?nh nhi?m trng, ch?ng h?n nh? acyclovir, adefovir, amphotericin B, bacitracin, cidofovir, foscarnet, ganciclovir, gentamicin,  pentamidine, vancomycin Cc thu?c khng vim khng steroid (NSAID), dng ?? ?i?u tr? ?au v vim, ch?ng h?n nh? ibuprofen  ho?c naproxen Cc thu?c khc  lm thay ??i nh?p tim Pamidronate Acid zoledronic Danh sch ny c th? khng m t? ?? h?t cc t??ng tc c th? x?y ra. Hy ??a cho ng??i cung c?p d?ch v? y t? c?a mnh danh sch t?t c? cc thu?c, th?o d??c, cc thu?c khng c?n toa, ho?c cc ch? ph?m b? sung m qu v? dng. C?ng nn bo cho h? bi?t r?ng qu v? c ht thu?c, u?ng r??u, ho?c c s? d?ng ma ty tri php hay khng. Vi th? c th? t??ng tc v?i thu?c c?a qu v?. Ti c?n ph?i theo di ?i?u g trong khi dng thu?c ny? Tnh tr?ng c?a qu v? se? ????c theo do?i c?n th?n trong khi qu v? ?ang du?ng thu?c na?y. Qu v? c th? c?n ?i lm xt nghi?m mu trong khi ?ang dng thu?c ny. Thu?c ny c th? lm cho qu v? c?m th?y khng ???c kh?e nh? th??ng l?. ?i?u ny khng ph?i l hi?m g?p, v thu?c ha tr? li?u c th? tc ??ng ln t? bo lnh l?n t? bo ung th?. Hy t??ng trnh m?i tc d?ng ph?. Hy ti?p t?c ??t ?i?u tr? c?a mnh ngay c? khi qu v? c?m th?y m?t, tr? khi ??i ng? ch?m [place] yu c?u qu v? ng?ng ?i?u tr?. Thu?c ny c th? lm t?ng nguy c? b? nhi?m trng. Hy g?i cho ??i ng? ch?m [place] c?a mnh, n?u qu v? b? s?t, ?n l?nh, ?au h?ng, ho?c c cc tri?u ch?ng khc c?a c?m l?nh hay cm. Khng ???c t? ?i?u tr? cho mnh. Hy c? trnh ? g?n nh?ng ng??i b? b?nh. Trnh dng cc thu?c c ch?a aspirin, acetaminophen , ibuprofen , naproxen ho?c ketoprofen, tr? khi ???c ??i ng? ch?m [place] c?a mnh ch? d?n. Cc thu?c ny c th? che l?p tri?u ch?ng s?t. Hy c?n th?n khi ?nh r?ng ho?c x?a r?ng b?ng ch? nha khoa hay b?ng t?m x?a r?ng, v qu v? c th? d? b? nhi?m trng ho?c d? b? ch?y mu h?n. N?u qu v? c ?i lm r?ng, th hy bo v?i nha s? r?ng qu v? ?ang dng thu?c ny. Thu?c ny c th? khi?n cho qu v? nh?y c?m h?n v?i l?nh. Khng ???c u?ng ?? u?ng l?nh ho?c n??c ?. Hy che kn cc vng da h? tr??c khi  ti?p xc v?i nhi?t ?? l?nh ho?c cc v?t l?nh. Khi ?i ra ngoi tr?i l?nh, hy m?c qu?n o ?m v che mi?ng v m?i ?? lm ?m khng kh ht vo ph?i. Hy bo cho ??i ng? ch?m [place] n?u qu v? b? nh?y c?m v?i l?nh. Hy bo cho ??i ng? ch?m [place], n?u qu v? ho?c b?n tnh c?a qu v? c thai ho?c ngh? r?ng mnh c th? ?ang c [place]. Thu?c ny c th? gy d? t?t b?m sinh nghim tr?ng n?u dng trong [place] k? v trong 9 thng sau li?u thu?c cu?i cng. C?n ph?i c k?t qu? th? [place] m tnh tr??c khi b?t ??u dng thu?c ny. Nn s? d?ng m?t bi?n php trnh [place] ?ng tin c?y trong khi dng thu?c ny v trong 9 thng sau li?u thu?c cu?i cng. Hy bn v?i ??i ng? ch?m [place] c?a qu v? v? cc bi?n php ng?a thai hi?u qu?[place]azmyn [place] ???c gy thu? thai trong khi du?ng thu?c na?y va? trong vng 6 tha?ng sau li?u thu?c cu?i cng. S? d?ng bao cao su khi quan h? tnh d?c trong kho?ng th?i gian ny. Khng ???c cho tr? b m? trong khi dng thu?c ny v  trong vng 3 thng sau khi dng li?u thu?c cu?i cng. Thu?c ny c th? gy v sinh. Hy th?o lu?n v?i ??i ng? ch?m [place] c?a mnh, n?u qu v? lo l??ng v? kha? n?ng sinh s?n c?a mnh. Ti c th? nh?n th?y nh?ng tc d?ng ph? no khi dng thu?c ny? Nh?ng tc d?ng ph? qu v? c?n ph?i bo cho ??i ng? ch?m [place] cng s?m cng t?t: Cc ph?n ?ng d? ?ng -- da b? n?i ban, ng?a, n?i my ?ay, s?ng ? m?t, mi, l??i, ho?c h?ng Xu?t huy?t -- phn c mu ho?c mu ?en, mu h?c n, i ra mu ho?c ch?t mu nu gi?ng b c ph, n??c ti?u mu ?? ho?c nu s?m, cc ??m nh? mu ?? ho?c tm trn da, b?m tm ho?c ch?y mu b?t th??ng [place] khan, kh th? ho?c h h?p b? tr? ng?i Cc thay ??i v? nh?p tim -- nh?p tim nhanh ho?c khng ??u, chng m?t, c?m th?y y?u ?t ho?c chong vng, ?au ng?c, kh th? Nhi?m trng -- s?t, ?n l?nh, ho, ?au h?ng, v?t th??ng khng lnh, ?au khi ?i ti?u ho?c kh ?i ti?u, c?m gic kh ch?u chung ho?c c?m th?y khng kh?e T?n th??ng gan -- ?au b?ng trn bn ph?i, chn ?n, bu?n i, phn b?c mu, n??c ti?u  vng s?m ho?c nu, vng da ho?c m?t, y?u ?t ho?c m?t m?i khc th??ng S? l??ng t? bo h?ng c?u th?p -- y?u ?t ho?c m?t m?i khc th??ng, chng m?t, ?au ??u, kh th? T?n th??ng c? b?p -- m?t m?i ho?c y?u ?t khc th??ng, ?au c? b?p, n??c ti?u mu vng s?m ho?c nu, gi?m l??ng n??c ti?u ?au, c c?m gic nh? b? ki?n b, ho?c t ? bn tay ho?c bn chn ?au ??u ??t ng?t v d? d?i, l l?n, thay ??i th? l?c, co gi?t, c th? l d?u hi?u c?a h?i ch?ng b?nh no sau c h?i ph?c (PRES) Xu?t huy?t ho?c b?m tm b?t th??ng Cc tc d?ng ph? th??ng khng c?n ph?i ch?m [place] y t? (hy bo cho ??i ng? ch?m [place] n?u cc tc d?ng ph? ny ti?p di?n ho?c gy phi?n toi): Tiu ch?y Bu?n i ?au, ?? ho?c s?ng km v?i v?t lot bn trong mi?ng ho?c h?ng Y?u ?t ho?c m?t m?i khc th??ng i m?a Danh sch ny c th? khng m t? ?? h?t cc tc d?ng ph? c th? x?y ra. Xin g?i t?i bc s? c?a mnh ?? ???c c? v?n chuyn mn v? cc tc d?ng ph?Kathlynn Pardon v? c th? t??ng trnh cc tc d?ng ph? cho FDA theo s? [phone]. Ti nn c?t gi? thu?c c?a mnh ? ?u? Thu?c ny th??ng ???c cho dng t?i b?nh vi?n ho?c phng m?ch. Thu?c ny khng ???c c?t gi? t?i nh. L?U : ?y l b?n tm t?t. N c th? khng bao hm t?t c? thng tin c th? c. N?u qu v? th?c m?c v? thu?c ny, xin trao ??i v?i bc s?, d??c s?, ho?c ng??i cung c?p d?ch v? y t? c?a mnh.  2024 Elsevier/Gold Standard (2022-08-08 00:00:00)

## 2023-11-27 NOTE — Progress Notes (Signed)
 Per Rande Bushy, NP and Dr. Maryalice Smaller- patient not receiving bevacizumab  today. Patient to be scheduled for blood products and/or iron in the near future.

## 2023-11-27 NOTE — Assessment & Plan Note (Addendum)
 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 -Despite strong recommendation, patient firmly and repeatedly declined IV chemo and surgery. She agreed to chemoRT with Xeloda , and received the treatment 01/04/21 - 02/14/21. -she developed local cancer progression and lung mets in 11/2021 -she started Xeloda  on 02/16/2022, low dose oxaliplatin  was added on 05/11/22 (with cycle 4 of Xeloda )  -chemo held since 07/2022 due to leg pain and recurrent nausea  -restaging CT 09/07/2022 showed stable disease in rectum and lung, no new lesions.   -she had chemo break from Feb-May 2024 -restart CAPOX with dose reduction 12/18/2022, not tolerating very well overall.  Oxaliplatin  was held on August 5. She is on oxaliplatin  every 6 weeks now.  -Restaging CT scan from 07/16/2023 showed stable rectal wall thickening, no other  evidence of metastasis  -10/09/2023 - restaging CT CAP showed overall stable disease. Concern for rectal tumor causing obstruction. Patient advised to contact cancer center if she goes more than 3 to 4 days without bowel movement. May need surgical referral for ostomy if obstruction does occur as she has already had radiation to the mass. She did voice understanding. Today, she reports improvement in bowel movements, though continues to have rectal pain with them. Having them nearly every day with soft stools.  --offered to increase oxaliplatin  to every 3 weeks. She prefers to keep this at every 6 weeks.  - 11/27/2023 -bevacizumab  held today due to recent history of bleeding.  Patient with significant decrease in hemoglobin to 7.1.  Proceed with oxaliplatin  as scheduled.  Reach out to Dr. Leonia Raman, GI to see if she can offer patient EGD and sigmoidoscopy to evaluate for new GI bleed and visualization of rectal tumor.  Patient to receive blood transfusion  and IV iron over next several days to improve blood counts.  She has agreed to receive blood transfusion. -For now, plan to see patient back for labs, flush, follow-up, and next treatment in 3 weeks.

## 2023-11-28 ENCOUNTER — Other Ambulatory Visit: Payer: Self-pay

## 2023-11-28 ENCOUNTER — Telehealth: Payer: Self-pay

## 2023-11-28 DIAGNOSIS — D649 Anemia, unspecified: Secondary | ICD-10-CM

## 2023-11-28 DIAGNOSIS — C20 Malignant neoplasm of rectum: Secondary | ICD-10-CM

## 2023-11-28 NOTE — Telephone Encounter (Signed)
 Phone call from patient .  She asked about transportation to tomorrow's appointment for blood transfusion at the [facility].  Informed her pick-up time is 8:15 am.  She requested CN be present for appointment.  She also asked about medications (tramadol  and mirtazapine ).  CN picked these up from [facility] and will give them to her tomorrow at [facility] appointment.  Raeford Bullion RN, Congregational Nurse [phone]

## 2023-11-29 ENCOUNTER — Other Ambulatory Visit (HOSPITAL_COMMUNITY): Payer: Self-pay

## 2023-11-29 ENCOUNTER — Other Ambulatory Visit: Payer: Self-pay

## 2023-11-29 ENCOUNTER — Inpatient Hospital Stay

## 2023-11-29 ENCOUNTER — Other Ambulatory Visit (HOSPITAL_BASED_OUTPATIENT_CLINIC_OR_DEPARTMENT_OTHER): Payer: Self-pay

## 2023-11-29 DIAGNOSIS — C20 Malignant neoplasm of rectum: Secondary | ICD-10-CM

## 2023-11-29 DIAGNOSIS — D649 Anemia, unspecified: Secondary | ICD-10-CM

## 2023-11-29 DIAGNOSIS — Z5111 Encounter for antineoplastic chemotherapy: Secondary | ICD-10-CM | POA: Diagnosis not present

## 2023-11-29 LAB — PREPARE RBC (CROSSMATCH)

## 2023-11-29 MED ORDER — SODIUM CHLORIDE 0.9% IV SOLUTION
250.0000 mL | INTRAVENOUS | Status: DC
  Administered 2023-11-29: 100 mL via INTRAVENOUS

## 2023-11-29 MED ORDER — HEPARIN SOD (PORK) LOCK FLUSH 100 UNIT/ML IV SOLN: 500.0000 [IU] | Freq: Every day | INTRAVENOUS | Status: DC | PRN

## 2023-11-29 MED ORDER — ACETAMINOPHEN 325 MG PO TABS
650.0000 mg | ORAL_TABLET | Freq: Once | ORAL | Status: AC
  Administered 2023-11-29: 650 mg via ORAL
  Filled 2023-11-29: qty 2

## 2023-11-29 MED ORDER — SODIUM CHLORIDE 0.9% FLUSH
10.0000 mL | INTRAVENOUS | Status: DC | PRN
Start: 2023-11-29 — End: 2023-11-29

## 2023-11-29 NOTE — Congregational Nurse Program (Signed)
 Home to deliver the following meds to patient:

## 2023-11-29 NOTE — Congregational Nurse Program (Signed)
 Accompanied patient to Cancer Center appointment for blood transfusion.  She requested CN call in request for medication refills for her and her husband.  Gave her refills for tramadol  and mirtazapine  which were picked up yesterday.  States she feels a little better today. Raeford Bullion RN, Congregational Nurse [phone]

## 2023-11-29 NOTE — Congregational Nurse Program (Signed)
 Home visit to deliver the following meds to patient:  lisinopril -hydrochlorothiazide , gabapentin , xarelto  and xeloda .  Reviewed instructions and patient wrote on labels in her language.  Advised her that pickup time for 12/03/2023 appointment will be 8:45 am.  Raeford Bullion RN, Congregational Nurse [phone] Raeford Bullion RN, Congregational Nurse [phone]

## 2023-11-29 NOTE — Patient Instructions (Signed)

## 2023-11-30 ENCOUNTER — Other Ambulatory Visit: Payer: Self-pay | Admitting: Nurse Practitioner

## 2023-11-30 LAB — BPAM RBC
Blood Product Expiration Date: 202506112359
ISSUE DATE / TIME: 202505150946
Unit Type and Rh: 6200

## 2023-11-30 LAB — TYPE AND SCREEN
ABO/RH(D): A POS
Antibody Screen: NEGATIVE
Unit division: 0

## 2023-12-03 ENCOUNTER — Telehealth: Payer: Self-pay

## 2023-12-03 ENCOUNTER — Inpatient Hospital Stay

## 2023-12-03 ENCOUNTER — Other Ambulatory Visit: Payer: Self-pay

## 2023-12-03 VITALS — BP 126/82 | HR 74 | Temp 98.1°F | Resp 16

## 2023-12-03 DIAGNOSIS — Z95828 Presence of other vascular implants and grafts: Secondary | ICD-10-CM

## 2023-12-03 DIAGNOSIS — Z5111 Encounter for antineoplastic chemotherapy: Secondary | ICD-10-CM | POA: Diagnosis not present

## 2023-12-03 MED ORDER — SODIUM CHLORIDE 0.9 % IV SOLN
INTRAVENOUS | Status: DC
Start: 2023-12-03 — End: 2023-12-03

## 2023-12-03 MED ORDER — SODIUM CHLORIDE 0.9 % IV SOLN
400.0000 mg | Freq: Once | INTRAVENOUS | Status: AC
  Administered 2023-12-03: 400.005 mg via INTRAVENOUS
  Filled 2023-12-03: qty 400

## 2023-12-03 NOTE — Congregational Nurse Program (Signed)
 Accompanied patient to appointment at [facility] for iron  infusion.  She tolerated procedure well.  Reminded her to start xeloda  tomorrow (2 tablets in the am and 2 in the pm.  She voiced understanding.  Also reminded her of appointment with [facility] on 12/06/23 ane that her transportation should arrive around 9:00 am.  Raeford Bullion RN, Congregational Nurse [phone]

## 2023-12-03 NOTE — Patient Instructions (Signed)

## 2023-12-03 NOTE — Telephone Encounter (Signed)
 Late Entry:  Congressional Nurse called stating that the pt's transportation did not come to pick up the pt this morning; therefore, the congressional nurse stated she would have to go pick-up the pt and bring the pt to her appts this morning.  Congressional Nurse stated pt maybe a little late for her appts.

## 2023-12-04 ENCOUNTER — Telehealth: Payer: Self-pay

## 2023-12-04 NOTE — Telephone Encounter (Signed)
 Phone call to patient. States that about 11:00 pm last night she felt very ill and vomited several times.  She feels better today and has been able to drink Ensure which she purchased yesterday.  Informed her that CN has tried several agencies regarding obtaining free Ensure without success. Reminded her of appointment with Internal Medicine on 12/06/23.  Raeford Bullion RN, Congregational Nurse [phone]

## 2023-12-06 ENCOUNTER — Other Ambulatory Visit (HOSPITAL_COMMUNITY): Payer: Self-pay

## 2023-12-06 ENCOUNTER — Ambulatory Visit (INDEPENDENT_AMBULATORY_CARE_PROVIDER_SITE_OTHER): Admitting: Student

## 2023-12-06 ENCOUNTER — Encounter: Payer: Self-pay | Admitting: Hematology

## 2023-12-06 VITALS — BP 109/66 | HR 63 | Temp 98.6°F | Ht <= 58 in | Wt 99.8 lb

## 2023-12-06 DIAGNOSIS — R109 Unspecified abdominal pain: Secondary | ICD-10-CM | POA: Diagnosis not present

## 2023-12-06 DIAGNOSIS — M25531 Pain in right wrist: Secondary | ICD-10-CM | POA: Diagnosis present

## 2023-12-06 DIAGNOSIS — R1084 Generalized abdominal pain: Secondary | ICD-10-CM

## 2023-12-06 MED ORDER — DICLOFENAC SODIUM 1 % EX GEL
4.0000 g | Freq: Four times a day (QID) | CUTANEOUS | 3 refills | Status: AC
  Filled 2023-12-06: qty 100, 7d supply, fill #0
  Filled 2024-03-01: qty 100, 8d supply, fill #0
  Filled 2024-03-05: qty 100, 8d supply, fill #1
  Filled 2024-03-13: qty 100, 8d supply, fill #2
  Filled 2024-03-21: qty 100, 8d supply, fill #3

## 2023-12-06 NOTE — Assessment & Plan Note (Signed)
 Patient initially presented to the clinic on 4/28 with right wrist pain that developed after Port-A-Cath placement.  X-ray of right wrist was largely unremarkable with mild osteoarthritis of the thumb.  The pain was thought to have possibly secondary to bevacizumab .  She was counseled on conservative management including application of ice to sensitive areas with as needed Tylenol .  Physical examination unchanged from previous visit. Pain improved today now about 5/10. Prior was at rated at 8/10. Currently taking the Tylenol  as needed which is about every couple of days. At this time, we will continue with conservative treatment with additional Voltaren gel as needed. - Voltaren gel as needed - Continue conservative treatment

## 2023-12-06 NOTE — Progress Notes (Signed)
 CC: Wrist and abdominal pain  HPI: [name] is a 73 y.o. female living with a history stated below and presents today for wrist and abdominal pain. Please see problem based assessment and plan for additional details.  Past Medical History:  Diagnosis Date   Bilateral wrist pain 10/25/2017   Hypertension    Left leg DVT (deep venous thrombosis) (HCC) 06/2022   Neck mass 1998   Unknown biopsy results. Excised in [place].   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 90 tablet 3   brimonidine  (ALPHAGAN ) 0.2 % ophthalmic solution Place 1 drop into the right eye 2 (two) times daily. 15 mL 3   brimonidine  (ALPHAGAN ) 0.2 % ophthalmic solution Place 1 drop into the right eye 2 (two) times daily. 10 mL 11   capecitabine  (XELODA ) 500 MG tablet Take 2 tabs every 12 hours, for 14 days then off for 7 days. Take 30mins after meal 56 tablet 2   dexamethasone  (DECADRON ) 4 MG tablet Take 1 tablet (4 mg total) by mouth daily. TAKE DAILY FOR 3-5 DAYS AFTER IV CHEMO 20 tablet 1   dorzolamide -timolol  (COSOPT ) 2-0.5 % ophthalmic solution Place 1 drop into both eyes 2 times daily. 30 mL 3   dorzolamide -timolol  (COSOPT ) 2-0.5 % ophthalmic solution Instill 1 drop into both eyes twice a day 10 mL 11   gabapentin  (NEURONTIN ) 100 MG capsule Take 1 capsule (100 mg total) by mouth 3 (three) times daily as needed. 90 capsule 2   lidocaine  (XYLOCAINE ) 5 % ointment Apply to anal area daily as needed. 50 g 1   lidocaine -prilocaine  (EMLA ) cream Apply topically daily as needed. 30 g 3   lisinopril -hydrochlorothiazide  (ZESTORETIC ) 20-12.5 MG tablet Take 2 tablets by mouth daily. 60 tablet 3   mirtazapine  (REMERON ) 30 MG tablet Take 1 tablet (30 mg total) by mouth  at bedtime. 30 tablet 3   Multiple Vitamins-Minerals (MULTIVITAMIN WITH MINERALS) tablet Take 1 tablet by mouth daily. Gummy 50 mg     ondansetron  (ZOFRAN ) 8 MG tablet Take 1 tablet (8 mg total) by mouth every 8 (eight) hours as needed for nausea or vomiting. Take after 3 days of chemo 30 tablet 2   polyethylene glycol powder (GLYCOLAX/MIRALAX) 17 GM/SCOOP powder Take 1 Container by mouth once.     potassium chloride  (KLOR-CON  M) 10 MEQ tablet Take 1 tablet (10 mEq total) by mouth 2 (two) times daily. 60 tablet 2   prochlorperazine  (COMPAZINE ) 10 MG tablet Take 1 tablet (10 mg total) by mouth every 6 (six) hours as needed. 30 tablet 2   rivaroxaban  (XARELTO ) 20 MG TABS tablet Take 1 tablet (20 mg total) by mouth daily with supper. 30 tablet 2   traMADol  (ULTRAM ) 50 MG tablet Take 1 tablet (50 mg total) by mouth every 6 (six) hours as needed. 90 tablet 0   urea  (CARMOL) 10 % cream Apply topically 2 times daily as needed. 85 g 3   No current facility-administered medications on file prior to visit.    Family History  Problem Relation Age of Onset   Headache Son    Colon cancer Neg Hx    Stomach cancer Neg Hx    Esophageal cancer Neg Hx  Social History   Socioeconomic History   Marital status: Married    Spouse name: Designer, jewellery   Number of children: Not on file   Years of education: Not on file   Highest education level: Not on file  Occupational History   Not on file  Tobacco Use   Smoking status: Never   Smokeless tobacco: Never  Vaping Use   Vaping status: Never Used  Substance and Sexual Activity   Alcohol use: No   Drug use: No   Sexual activity: Not on file  Other Topics Concern   Not on file  Social History Narrative   Came to [place]  from [place] in 2013.   Does not know much family history [place].   Social Drivers of Corporate investment banker Strain: Not on file  Food Insecurity: No Food  Insecurity (05/15/2023)   Hunger Vital Sign    Worried About Running Out of Food in the Last Year: Never true    Ran Out of Food in the Last Year: Never true  Transportation Needs: No Transportation Needs (05/10/2022)   PRAPARE - Administrator, Civil Service (Medical): No    Lack of Transportation (Non-Medical): No  Physical Activity: Not on file  Stress: Not on file  Social Connections: Not on file  Intimate Partner Violence: Not At Risk (12/22/2020)   Humiliation, Afraid, Rape, and Kick questionnaire    Fear of Current or Ex-Partner: No    Emotionally Abused: No    Physically Abused: No    Sexually Abused: No    Review of Systems: ROS negative except for what is noted on the assessment and plan.  Vitals:   12/06/23 0944  BP: 109/66  Pulse: 63  Temp: 98.6 F (37 C)  TempSrc: Oral  SpO2: 96%  Weight: 99 lb 12.8 oz (45.3 kg)  Height: 4\' 10"  (1.473 m)    Physical Exam: Constitutional: well-appearing in no acute distress HENT: normocephalic atraumatic, mucous membranes moist Eyes: conjunctiva non-erythematous Neck: supple Cardiovascular: regular rate and rhythm, no m/r/g Pulmonary/Chest: normal work of breathing on room air, lungs clear to auscultation bilaterally Abdominal: soft, non-tender, non-distended MSK: normal bulk and tone Neurological: alert & oriented x 3, 5/5 strength in bilateral upper and lower extremities, normal gait Skin: warm and dry  Assessment & Plan:   Acute pain of right wrist Patient initially presented to the clinic on 4/28 with right wrist pain that developed after Port-A-Cath placement.  X-ray of right wrist was largely unremarkable with mild osteoarthritis of the thumb.  The pain was thought to have possibly secondary to bevacizumab .  She was counseled on conservative management including application of ice to sensitive areas with as needed Tylenol .  Physical examination unchanged from previous visit. Pain improved today now about  5/10. Prior was at rated at 8/10. Currently taking the Tylenol  as needed which is about every couple of days. At this time, we will continue with conservative treatment with additional Voltaren gel as needed. - Voltaren gel as needed - Continue conservative treatment  Abdominal pain Patient at roughly 11 PM on 5/19 she began to feel very ill with multiple episodes of vomiting.  The following day, she felt better and was able to drink Ensure. Back to baseline. Suspect this was secondary from infusion.   Patient discussed with Dr. Brigitte Canard, MD  [facility], PGY-1 Date 12/06/2023 Time 10:52  AM

## 2023-12-06 NOTE — Patient Instructions (Signed)
 Thank you so much for coming to the clinic today!   We will see you in three months.   If you have any questions please feel free to the call the clinic at anytime at [phone]. It was a pleasure seeing you!  Best, Dr. Rayvon Char

## 2023-12-06 NOTE — Congregational Nurse Program (Signed)
 Accompanied patient to appointment with PCP at [facility].  This was a follow-up for wrist pain.  Dr. Carolee Churchman stated that her x-ray showed arthritis.  She has been using ice and tylenol  with some improvement.  Voltaren gel was ordered today and picked up from pharmacy.  Return in 3 months.  CN called Medicaid transportation to take patient home.  Raeford Bullion RN, Congregational Nurse [phone]

## 2023-12-06 NOTE — Assessment & Plan Note (Signed)
 Patient at roughly 11 PM on 5/19 she began to feel very ill with multiple episodes of vomiting.  The following day, she felt better and was able to drink Ensure. Back to baseline. Suspect this was secondary from infusion.

## 2023-12-07 NOTE — Progress Notes (Signed)
 Internal Medicine Clinic Attending  Case discussed with the resident at the time of the visit.  We reviewed the resident's history and exam and pertinent patient test results.  I agree with the assessment, diagnosis, and plan of care documented in the resident's note.

## 2023-12-13 ENCOUNTER — Telehealth: Payer: Self-pay

## 2023-12-13 NOTE — Telephone Encounter (Signed)
 Scheduled transportation for June 2,2025 appointment at [facility].  Pickup time is 11:45 am. Raeford Bullion RN, Congregational Nurse [phone]

## 2023-12-17 ENCOUNTER — Ambulatory Visit: Admitting: Nurse Practitioner

## 2023-12-17 ENCOUNTER — Ambulatory Visit

## 2023-12-17 ENCOUNTER — Inpatient Hospital Stay: Attending: Physician Assistant

## 2023-12-17 ENCOUNTER — Inpatient Hospital Stay

## 2023-12-17 ENCOUNTER — Encounter: Payer: Self-pay | Admitting: Hematology

## 2023-12-17 ENCOUNTER — Other Ambulatory Visit (HOSPITAL_COMMUNITY): Payer: Self-pay

## 2023-12-17 ENCOUNTER — Other Ambulatory Visit

## 2023-12-17 ENCOUNTER — Inpatient Hospital Stay (HOSPITAL_BASED_OUTPATIENT_CLINIC_OR_DEPARTMENT_OTHER): Attending: Physician Assistant | Admitting: Hematology

## 2023-12-17 ENCOUNTER — Other Ambulatory Visit: Payer: Self-pay

## 2023-12-17 VITALS — BP 122/68 | HR 72 | Temp 98.2°F | Resp 17

## 2023-12-17 VITALS — BP 122/62 | HR 74 | Temp 98.5°F | Resp 15 | Ht <= 58 in | Wt 105.7 lb

## 2023-12-17 DIAGNOSIS — Z923 Personal history of irradiation: Secondary | ICD-10-CM | POA: Insufficient documentation

## 2023-12-17 DIAGNOSIS — Z86718 Personal history of other venous thrombosis and embolism: Secondary | ICD-10-CM | POA: Diagnosis not present

## 2023-12-17 DIAGNOSIS — Z5111 Encounter for antineoplastic chemotherapy: Secondary | ICD-10-CM | POA: Insufficient documentation

## 2023-12-17 DIAGNOSIS — K922 Gastrointestinal hemorrhage, unspecified: Secondary | ICD-10-CM | POA: Diagnosis not present

## 2023-12-17 DIAGNOSIS — E440 Moderate protein-calorie malnutrition: Secondary | ICD-10-CM | POA: Insufficient documentation

## 2023-12-17 DIAGNOSIS — D509 Iron deficiency anemia, unspecified: Secondary | ICD-10-CM

## 2023-12-17 DIAGNOSIS — D649 Anemia, unspecified: Secondary | ICD-10-CM | POA: Insufficient documentation

## 2023-12-17 DIAGNOSIS — C7800 Secondary malignant neoplasm of unspecified lung: Secondary | ICD-10-CM | POA: Insufficient documentation

## 2023-12-17 DIAGNOSIS — Z7901 Long term (current) use of anticoagulants: Secondary | ICD-10-CM | POA: Diagnosis not present

## 2023-12-17 DIAGNOSIS — Z95828 Presence of other vascular implants and grafts: Secondary | ICD-10-CM

## 2023-12-17 DIAGNOSIS — C20 Malignant neoplasm of rectum: Secondary | ICD-10-CM

## 2023-12-17 LAB — CBC WITH DIFFERENTIAL (CANCER CENTER ONLY)
Abs Immature Granulocytes: 0.05 10*3/uL (ref 0.00–0.07)
Basophils Absolute: 0 10*3/uL (ref 0.0–0.1)
Basophils Relative: 1 %
Eosinophils Absolute: 0.1 10*3/uL (ref 0.0–0.5)
Eosinophils Relative: 3 %
HCT: 27.8 % — ABNORMAL LOW (ref 36.0–46.0)
Hemoglobin: 9.3 g/dL — ABNORMAL LOW (ref 12.0–15.0)
Immature Granulocytes: 1 %
Lymphocytes Relative: 23 %
Lymphs Abs: 1 10*3/uL (ref 0.7–4.0)
MCH: 28.4 pg (ref 26.0–34.0)
MCHC: 33.5 g/dL (ref 30.0–36.0)
MCV: 84.8 fL (ref 80.0–100.0)
Monocytes Absolute: 0.5 10*3/uL (ref 0.1–1.0)
Monocytes Relative: 12 %
Neutro Abs: 2.6 10*3/uL (ref 1.7–7.7)
Neutrophils Relative %: 60 %
Platelet Count: 209 10*3/uL (ref 150–400)
RBC: 3.28 MIL/uL — ABNORMAL LOW (ref 3.87–5.11)
RDW: 20.9 % — ABNORMAL HIGH (ref 11.5–15.5)
WBC Count: 4.3 10*3/uL (ref 4.0–10.5)
nRBC: 0 % (ref 0.0–0.2)

## 2023-12-17 LAB — TOTAL PROTEIN, URINE DIPSTICK: Protein, ur: NEGATIVE mg/dL

## 2023-12-17 LAB — CMP (CANCER CENTER ONLY)
ALT: <5 U/L (ref 0–44)
AST: 13 U/L — ABNORMAL LOW (ref 15–41)
Albumin: 3.3 g/dL — ABNORMAL LOW (ref 3.5–5.0)
Alkaline Phosphatase: 38 U/L (ref 38–126)
Anion gap: 3 — ABNORMAL LOW (ref 5–15)
BUN: 15 mg/dL (ref 8–23)
CO2: 31 mmol/L (ref 22–32)
Calcium: 8.3 mg/dL — ABNORMAL LOW (ref 8.9–10.3)
Chloride: 107 mmol/L (ref 98–111)
Creatinine: 0.91 mg/dL (ref 0.44–1.00)
GFR, Estimated: >60 mL/min (ref >60)
Glucose, Bld: 119 mg/dL — ABNORMAL HIGH (ref 70–99)
Potassium: 3.2 mmol/L — ABNORMAL LOW (ref 3.5–5.1)
Sodium: 141 mmol/L (ref 135–145)
Total Bilirubin: 0.3 mg/dL (ref 0.0–1.2)
Total Protein: 6.1 g/dL — ABNORMAL LOW (ref 6.5–8.1)

## 2023-12-17 LAB — CEA (ACCESS): CEA (CHCC): 19.6 ng/mL — ABNORMAL HIGH (ref 0.00–5.00)

## 2023-12-17 LAB — FERRITIN: Ferritin: 169 ng/mL (ref 11–307)

## 2023-12-17 MED ORDER — SODIUM CHLORIDE 0.9 % IV SOLN
7.5000 mg/kg | Freq: Once | INTRAVENOUS | Status: AC
  Administered 2023-12-17: 350 mg via INTRAVENOUS
  Filled 2023-12-17: qty 14

## 2023-12-17 MED ORDER — SODIUM CHLORIDE 0.9% FLUSH
10.0000 mL | Freq: Once | INTRAVENOUS | Status: AC
  Administered 2023-12-17: 10 mL

## 2023-12-17 MED ORDER — SODIUM CHLORIDE 0.9 % IV SOLN: Freq: Once | INTRAVENOUS | Status: AC

## 2023-12-17 MED ORDER — CAPECITABINE 500 MG PO TABS
ORAL_TABLET | ORAL | 2 refills | Status: DC
  Filled 2023-12-17: qty 56, fill #0
  Filled 2023-12-20: qty 56, 21d supply, fill #0
  Filled 2024-01-08: qty 56, 21d supply, fill #1
  Filled 2024-01-28: qty 56, 14d supply, fill #2

## 2023-12-17 NOTE — Assessment & Plan Note (Signed)
 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 -Despite strong recommendation, patient firmly and repeatedly declined IV chemo and surgery. She agreed to chemoRT with Xeloda , and received the treatment 01/04/21 - 02/14/21. -she developed local cancer progression and lung mets in 11/2021 -she started Xeloda  on 02/16/2022, low dose oxaliplatin  was added on 05/11/22 (with cycle 4 of Xeloda )  -chemo held since 07/2022 due to leg pain and recurrent nausea  -restaging CT 09/07/2022 showed stable disease in rectum and lung, no new lesions.   -she had chemo break from Feb-May 2024 -restart CAPOX with dose reduction 12/18/2022, not tolerating very well overall.  Oxaliplatin  was held on August 5. She is on oxaliplatin  every 6 weeks now.  -Restaging CT scan from 10/09/2023 showed stable rectal wall thickening, no other  evidence of metastasis

## 2023-12-17 NOTE — Progress Notes (Signed)
 [facility]   [phone] [phone]   Clinic Follow up Note   Patient Care Team: Sheree Dieter, MD as PCP - General Berna Breslow, RN (Inactive) as Oncology Nurse Navigator Rolin Clifton, NP as Nurse Practitioner (Nurse Practitioner) Sonja [place], MD as Consulting Physician (Hematology and Oncology) Johna Myers, MD as Consulting Physician (Radiation Oncology) [name], Albino Alu., MD as Consulting Physician (Gastroenterology) Prudy Brownie, DO (Inactive) as Consulting Physician (Pulmonary Disease) Candyce Champagne, MD as Consulting Physician (General Surgery)  Date of Service:  12/17/2023  CHIEF COMPLAINT: f/u of rectal cancer  CURRENT THERAPY:  CapeOx and bevacizumab  with oxaliplatin  every 6 weeks  Oncology History   Rectal adenocarcinoma (HCC) cT3cN1M0 stage IIIb, biopsy confirmed residual primary tumor and oligo lung met in 09/2021 -Initially diagnosed 11/2020 by colonoscopy for progressive rectal pain and bleeding, weight loss, and constipation. -Despite strong recommendation, patient firmly and repeatedly declined IV chemo and surgery. She agreed to chemoRT with Xeloda , and received the treatment 01/04/21 - 02/14/21. -she developed local cancer progression and lung mets in 11/2021 -she started Xeloda  on 02/16/2022, low dose oxaliplatin  was added on 05/11/22 (with cycle 4 of Xeloda )  -chemo held since 07/2022 due to leg pain and recurrent nausea  -restaging CT 09/07/2022 showed stable disease in rectum and lung, no new lesions.   -she had chemo break from Feb-May 2024 -restart CAPOX with dose reduction 12/18/2022, not tolerating very well overall.  Oxaliplatin  was held on August 5. She is on oxaliplatin  every 6 weeks now.  -Restaging CT scan from 10/09/2023 showed stable rectal wall thickening, no other  evidence of metastasis   Assessment & Plan Metastatic rectal cancer Metastatic rectal cancer with recent weight gain and improved bowel and  urinary function. Last chemotherapy infusion was on Nov 27, 2023, and she tolerated it well. She experienced fatigue after the iron  infusion but reported feeling better by 10 PM. Currently on oral chemotherapy (Xeloda ) and is on schedule with the medication. Hemoglobin is 9.3, allowing for the continuation of bevacizumab , which was previously held due to bleeding. The bleeding is suspected to be from the tumor. - Administer bevacizumab  today. - Continue oral chemotherapy (Xeloda ) as scheduled. - Schedule next few cycles of chemotherapy. - Plan for a scan in five weeks to assess the status of the cancer. - Ensure she is not allergic to contrast for the upcoming scan.  GI bleeding GI bleeding likely secondary to the tumor. The bleeding has stopped, allowing for the resumption of bevacizumab . She has not yet heard from the GI doctor regarding the need for endoscopy. - Contact GI doctor (Dr. Nanigan) to follow up on the need for endoscopy due to GI bleeding.     SUMMARY OF ONCOLOGIC HISTORY: Oncology History Overview Note  Cancer Staging Rectal adenocarcinoma [facility]) Staging form: Colon and Rectum, AJCC 8th Edition - Clinical stage from 12/21/2020: Stage IIIB (cT3, cN1, cM0) - Unsigned    Rectal adenocarcinoma (HCC)  08/26/2020 Miscellaneous   Initial presentation to PCP, reporting intermittent BRBPR since 02/2020    12/01/2020 Procedure   Colonoscopy by Dr. Leonia Raman findings - The perianal and digital rectal examinations were normal. - A 15 mm polyp was found in the ascending colon. The polyp was semi-pedunculated. Resection and retrieval were complete. - A 25 mm polyp was found in the sigmoid colon. The polyp was pedunculated. Resection and retrieval were complete. - An infiltrative partially obstructing large mass was found in the proximal rectum. The mass was partially  circumferential (involving one-half of the lumen circumference). The mass measured eight cm in length extending from 10 to  18cm from anal verge. This was biopsied with a cold forceps for histology. Proximal and distal opposite fold area of the mass lesion was tattooed with an injection of total 3 mL of Spot (carbon black).   12/01/2020 Initial Biopsy   Diagnosis 1. Colon, polyp(s), ascending x 1 - ADENOCARCINOMA ARISING IN A TUBULAR ADENOMA WITH HIGH-GRADE DYSPLASIA. SEE NOTE 2. Colon, sigmoid polyp, x1 - TUBULOVILLOUS ADENOMA(S) - NEGATIVE FOR HIGH-GRADE DYSPLASIA OR MALIGNANCY 3. Rectum, biopsy - ADENOCARCINOMA. SEE NOTE   12/01/2020 Cancer Staging   Cancer Staging Rectal adenocarcinoma [facility]) Staging form: Colon and Rectum, AJCC 8th Edition - Clinical stage from 12/21/2020: Stage IIIB (cT3, cN1, cM0) - Unsigned    12/06/2020 Imaging   CT CAP with contrast IMPRESSION: 1. There is partially circumferential soft tissue thickening of the mid to superior rectum, approximately 5 cm in length and the inferior extent approximately 6 cm above the anal verge, consistent with primary rectal malignancy identified by colonoscopy. 2. There appear to be abnormally enlarged perirectal lymph nodes posteriorly about the superior rectum measuring up to 1.0 x 0.8 cm. Findings are suspicious for perirectal nodal metastatic disease, however rectal MRI is the test of choice for initial local staging of rectal cancer. 3. There is a 4 mm nonspecific pulmonary nodule of the superior segment left lower lobe, statistically most likely incidental, infectious or inflammatory, although nonspecific and isolated metastatic disease is not strictly excluded. Attention on follow-up. 4. No other evidence of metastatic disease in the chest, abdomen, or pelvis. Aortic Atherosclerosis (ICD10-I70.0).   12/09/2020 Imaging   Local staging MRI pelvis without contrast IMPRESSION: 4.9 cm circumferential mid/lower rectal mass, corresponding to the patient's newly diagnosed rectal cancer. Rectal adenocarcinoma T stage: T3c Rectal adenocarcinoma  N stage:  N1 Distance from tumor to the internal anal sphincter is 3.7 cm.   12/21/2020 Initial Diagnosis   Rectal adenocarcinoma (HCC)   01/04/2021 -  Chemotherapy   Concurrent chemoradiation with Xeloda  1000mg  in the AM and 1500mg  in the PM on days of Radiation starting 01/04/21.       01/04/2021 - 02/11/2021 Radiation Therapy   Concurrent chemoradiation with Dr Jeryl Moris and Xeloda  starting 01/04/21.    01/31/2022 Procedure   Colonoscopy, Dr. Leonia Raman  Findings: - An infiltrative partially obstructing large mass was found in the rectum. The mass was circumferential. The mass measured ten cm in length, extending from 5-15cm from anal verge. In addition, rectal diameter at narrowest approx twelve mm. Oozing was present. Biopsies were taken with a cold forceps for histology.  Impression: - Hemorrhoids found on perianal exam. - Likely malignant partially obstructing tumor in the rectum. Biopsied. - Non-bleeding external and internal hemorrhoids.   01/31/2022 Pathology Results   Diagnosis Rectum, biopsy INVASIVE MODERATELY DIFFERENTIATED ADENOCARCINOMA   05/11/2022 -  Chemotherapy   Patient is on Treatment Plan : COLORECTAL Xelox (Capeox)(130/850) q21d     09/07/2022 Imaging    IMPRESSION: 1. No significant change in circumferential wall thickening of the rectum, in keeping with known primary rectal mass. Similar appearance of post treatment perirectal and presacral fat stranding and fascial thickening 2. Unchanged nodule of the superior segment left lower lobe with adjacent bandlike scarring. No new nodules. 3. No evidence of lymphadenopathy or other metastatic disease in the chest, abdomen, or pelvis. 4. Large burden of stool throughout the colon. 5. Cardiomegaly.   12/08/2022 Imaging    IMPRESSION:  Persistent wall thickening along the rectum with adjacent severe inflammatory stranding and nodularity. Please correlate with exact location of the distribution of the patient's  neoplasm.   No discrete new mass lesion, fluid collection or lymph node enlargement.   Improved visualization of the fractures involving the left side of the pubic symphysis as well as probable insufficiency fractures along the sacrum. Associated areas of increasing heterogeneous bony sclerosis along the pubic bones and sacrum. In principle sclerotic bone metastases would be in the differential but are felt to be less likely based on the overall appearance. If needed confirmatory MRI can be considered as clinically appropriate to further delineate.   Stable scarring and fibrotic changes along the lungs with a tiny nodular area along the superior segment of the left lower lobe. Simple continued follow up.   Enlarged heart.   10/09/2023 Imaging   CT chest abdomen and pelvis with contrast  MPRESSION: 1. Persistent circumferential wall thickening in the rectum, not substantially changed taking into account less rectal distention on the current study. 2. Marked stool volume throughout the colon proximal to the rectum, similar to minimally progressive in the interval. While this may reflect clinical constipation, a degree of stricture related to the rectal neoplasm cannot be excluded. 3. No findings on the current study to suggest progressive or metastatic disease. 4. Heterogeneous mineralization of the sacrum, as before. Nonspecific by CT, features may reflect old trauma.      Discussed the use of AI scribe software for clinical note transcription with the patient, who gave verbal consent to proceed.  History of Present Illness Jillian Lutz is a 73 year old female with metastatic rectal cancer who presents for follow-up. She is accompanied by her caregiver.  She feels significantly better overall and has gained weight to 105 pounds from 99 pounds. Her appetite has improved, and she is eating well. Bowel and urinary functions are normal.  Her last chemotherapy infusion was on May 13.  She tolerated it well but experienced fatigue after an iron  infusion at home. She also received a blood transfusion prior to her last visit.  There is ongoing gastrointestinal bleeding, and she is awaiting communication from her GI doctor regarding an endoscopy. A language barrier complicates phone communication.  She is on oral chemotherapy, Xeloda , taking two tablets twice daily for two weeks, starting May 20, without missing doses. She also takes tramadol  and mirtazapine . No new or worsening symptoms are reported.     All other systems were reviewed with the patient and are negative.  MEDICAL HISTORY:  Past Medical History:  Diagnosis Date   Bilateral wrist pain 10/25/2017   Hypertension    Left leg DVT (deep venous thrombosis) (HCC) 06/2022   Neck mass 1998   Unknown biopsy results. Excised in [place].   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   Rectal cancer (HCC)    Trigger finger, acquired 02/08/2017    SURGICAL HISTORY: Past Surgical History:  Procedure Laterality Date   BRONCHIAL BIOPSY  12/13/2021   Procedure: BRONCHIAL BIOPSIES;  Surgeon: Prudy Brownie, DO;  Location: [facility];  Service: Pulmonary;;   BRONCHIAL NEEDLE ASPIRATION BIOPSY  12/13/2021   Procedure: BRONCHIAL NEEDLE ASPIRATION BIOPSIES;  Surgeon: Prudy Brownie, DO;  Location: [facility] ENDOSCOPY;  Service: Pulmonary;;   IR IMAGING GUIDED PORT INSERTION  09/17/2023   NECK SURGERY Left 1998   Excision of mass in [place]   VIDEO BRONCHOSCOPY WITH RADIAL ENDOBRONCHIAL ULTRASOUND  12/13/2021  Procedure: RADIAL ENDOBRONCHIAL ULTRASOUND;  Surgeon: Prudy Brownie, DO;  Location: [facility] ENDOSCOPY;  Service: Pulmonary;;    I have reviewed the social history and family history with the patient and they are unchanged from previous note.  ALLERGIES:  is allergic to no known allergies.  MEDICATIONS:  Current Outpatient Medications  Medication Sig Dispense Refill   amLODipine  (NORVASC ) 5  MG tablet Take 1 tablet (5 mg total) by mouth daily. 90 tablet 3   brimonidine  (ALPHAGAN ) 0.2 % ophthalmic solution Place 1 drop into the right eye 2 (two) times daily. 15 mL 3   brimonidine  (ALPHAGAN ) 0.2 % ophthalmic solution Place 1 drop into the right eye 2 (two) times daily. 10 mL 11   capecitabine  (XELODA ) 500 MG tablet Take 2 tabs every 12 hours, for 14 days then off for 7 days. Take 30mins after meal 56 tablet 2   dexamethasone  (DECADRON ) 4 MG tablet Take 1 tablet (4 mg total) by mouth daily. TAKE DAILY FOR 3-5 DAYS AFTER IV CHEMO 20 tablet 1   diclofenac  Sodium (VOLTAREN ) 1 % GEL Apply 4 grams topically 4 (four) times daily. 100 g 3   dorzolamide -timolol  (COSOPT ) 2-0.5 % ophthalmic solution Place 1 drop into both eyes 2 times daily. 30 mL 3   dorzolamide -timolol  (COSOPT ) 2-0.5 % ophthalmic solution Instill 1 drop into both eyes twice a day 10 mL 11   gabapentin  (NEURONTIN ) 100 MG capsule Take 1 capsule (100 mg total) by mouth 3 (three) times daily as needed. 90 capsule 2   lidocaine  (XYLOCAINE ) 5 % ointment Apply to anal area daily as needed. 50 g 1   lidocaine -prilocaine  (EMLA ) cream Apply topically daily as needed. 30 g 3   lisinopril -hydrochlorothiazide  (ZESTORETIC ) 20-12.5 MG tablet Take 2 tablets by mouth daily. 60 tablet 3   mirtazapine  (REMERON ) 30 MG tablet Take 1 tablet (30 mg total) by mouth at bedtime. 30 tablet 3   Multiple Vitamins-Minerals (MULTIVITAMIN WITH MINERALS) tablet Take 1 tablet by mouth daily. Gummy 50 mg     ondansetron  (ZOFRAN ) 8 MG tablet Take 1 tablet (8 mg total) by mouth every 8 (eight) hours as needed for nausea or vomiting. Take after 3 days of chemo 30 tablet 2   polyethylene glycol powder (GLYCOLAX/MIRALAX) 17 GM/SCOOP powder Take 1 Container by mouth once.     potassium chloride  (KLOR-CON  M) 10 MEQ tablet Take 1 tablet (10 mEq total) by mouth 2 (two) times daily. 60 tablet 2   prochlorperazine  (COMPAZINE ) 10 MG tablet Take 1 tablet (10 mg total) by mouth  every 6 (six) hours as needed. 30 tablet 2   rivaroxaban  (XARELTO ) 20 MG TABS tablet Take 1 tablet (20 mg total) by mouth daily with supper. 30 tablet 2   traMADol  (ULTRAM ) 50 MG tablet Take 1 tablet (50 mg total) by mouth every 6 (six) hours as needed. 90 tablet 0   urea  (CARMOL) 10 % cream Apply topically 2 times daily as needed. 85 g 3   No current facility-administered medications for this visit.    PHYSICAL EXAMINATION: ECOG PERFORMANCE STATUS: 2 - Symptomatic, <50% confined to bed  Vitals:   12/17/23 1238  BP: 122/62  Pulse: 74  Resp: 15  Temp: 98.5 F (36.9 C)  SpO2: 99%   Wt Readings from Last 3 Encounters:  12/17/23 105 lb 11.2 oz (47.9 kg)  12/06/23 99 lb 12.8 oz (45.3 kg)  11/27/23 100 lb 12.8 oz (45.7 kg)     GENERAL:alert, no distress and comfortable SKIN: skin  color, texture, turgor are normal, no rashes or significant lesions EYES: normal, Conjunctiva are pink and non-injected, sclera clear NECK: supple, thyroid normal size, non-tender, without nodularity LYMPH:  no palpable lymphadenopathy in the cervical, axillary  LUNGS: clear to auscultation and percussion with normal breathing effort HEART: regular rate & rhythm and no murmurs and no lower extremity edema ABDOMEN:abdomen soft, non-tender and normal bowel sounds Musculoskeletal:no cyanosis of digits and no clubbing  NEURO: alert & oriented x 3 with fluent speech, no focal motor/sensory deficits  Physical Exam MEASUREMENTS: Weight- 105.  LABORATORY DATA:  I have reviewed the data as listed    Latest Ref Rng & Units 12/17/2023   12:13 PM 11/27/2023    9:42 AM 11/05/2023    7:58 AM  CBC  WBC 4.0 - 10.5 K/uL 4.3  3.8  5.2   Hemoglobin 12.0 - 15.0 g/dL 9.3  7.1  9.4   Hematocrit 36.0 - 46.0 % 27.8  21.3  28.5   Platelets 150 - 400 K/uL 209  200  214         Latest Ref Rng & Units 12/17/2023   12:13 PM 11/27/2023    9:42 AM 11/05/2023    7:58 AM  CMP  Glucose 70 - 99 mg/dL 952  841  324   BUN 8 - 23  mg/dL 15  16  19    Creatinine 0.44 - 1.00 mg/dL 4.01  0.27  2.53   Sodium 135 - 145 mmol/L 141  138  137   Potassium 3.5 - 5.1 mmol/L 3.2  3.5  3.2   Chloride 98 - 111 mmol/L 107  101  101   CO2 22 - 32 mmol/L 31  32  31   Calcium 8.9 - 10.3 mg/dL 8.3  8.4  8.6   Total Protein 6.5 - 8.1 g/dL 6.1  6.6  7.2   Total Bilirubin 0.0 - 1.2 mg/dL 0.3  0.4  0.4   Alkaline Phos 38 - 126 U/L 38  46  51   AST 15 - 41 U/L 13  13  14    ALT 0 - 44 U/L <5  <5  <5       RADIOGRAPHIC STUDIES: I have personally reviewed the radiological images as listed and agreed with the findings in the report. No results found.    Orders Placed This Encounter  Procedures   CT CHEST ABDOMEN PELVIS W CONTRAST    Standing Status:   Future    Expected Date:   01/21/2024    Expiration Date:   12/16/2024    If indicated for the ordered procedure, I authorize the administration of contrast media per Radiology protocol:   Yes    Does the patient have a contrast media/X-ray dye allergy?:   No    Preferred imaging location?:   [facility]    If indicated for the ordered procedure, I authorize the administration of oral contrast media per Radiology protocol:   Yes   CBC with Differential (Cancer Center Only)    Standing Status:   Future    Expected Date:   01/07/2024    Expiration Date:   01/06/2025   CMP (Cancer Center only)    Standing Status:   Future    Expected Date:   01/07/2024    Expiration Date:   01/06/2025   CBC with Differential (Cancer Center Only)    Standing Status:   Future    Expected Date:   01/28/2024    Expiration  Date:   01/27/2025   CBC with Differential (Cancer Center Only)    Standing Status:   Future    Expected Date:   02/18/2024    Expiration Date:   02/17/2025   CMP (Cancer Center only)    Standing Status:   Future    Expected Date:   02/18/2024    Expiration Date:   02/17/2025   CBC with Differential (Cancer Center Only)    Standing Status:   Future    Expected Date:   03/10/2024     Expiration Date:   03/10/2025   All questions were answered. The patient knows to call the clinic with any problems, questions or concerns. No barriers to learning was detected. The total time spent in the appointment was 30 minutes, including review of chart and various tests results, discussions about plan of care and coordination of care plan     Sonja [place]um, MD 12/17/2023

## 2023-12-17 NOTE — Congregational Nurse Program (Signed)
 Accompanied patient to Cancer Center appointment with Dr. Maryalice Smaller.  Labs and vital signs were normal and she has gained about 5 pounds.  She voiced no complaints at present.  Will finish xeloda  today after evening dose and will start next round on December 25, 2023.  Dr. Maryalice Smaller reminded her to schedule appointment with [facility] and that in about 5 weeks she will .need a C-T scan.   Raeford Bullion RN, Congregational Nurse [phone]

## 2023-12-17 NOTE — Patient Instructions (Signed)
 [facility] - A DEPT OF [facility]  Discharge Instructions: Thank you for choosing [facility] to provide your oncology and hematology care.   If you have a lab appointment with the Cancer Center, please go directly to the Cancer Center and check in at the registration area.   Wear comfortable clothing and clothing appropriate for easy access to any Portacath or PICC line.   We strive to give you quality time with your provider. You may need to reschedule your appointment if you arrive late (15 or more minutes).  Arriving late affects you and other patients whose appointments are after yours.  Also, if you miss three or more appointments without notifying the office, you may be dismissed from the clinic at the provider's discretion.      For prescription refill requests, have your pharmacy contact our office and allow 72 hours for refills to be completed.    Today you received the following chemotherapy and/or immunotherapy agents: Bevacizumab      To help prevent nausea and vomiting after your treatment, we encourage you to take your nausea medication as directed.  BELOW ARE SYMPTOMS THAT SHOULD BE REPORTED IMMEDIATELY: *FEVER GREATER THAN 100.4 F (38 C) OR HIGHER *CHILLS OR SWEATING *NAUSEA AND VOMITING THAT IS NOT CONTROLLED WITH YOUR NAUSEA MEDICATION *UNUSUAL SHORTNESS OF BREATH *UNUSUAL BRUISING OR BLEEDING *URINARY PROBLEMS (pain or burning when urinating, or frequent urination) *BOWEL PROBLEMS (unusual diarrhea, constipation, pain near the anus) TENDERNESS IN MOUTH AND THROAT WITH OR WITHOUT PRESENCE OF ULCERS (sore throat, sores in mouth, or a toothache) UNUSUAL RASH, SWELLING OR PAIN  UNUSUAL VAGINAL DISCHARGE OR ITCHING   Items with * indicate a potential emergency and should be followed up as soon as possible or go to the Emergency Department if any problems should occur.  Please show the CHEMOTHERAPY ALERT CARD or  IMMUNOTHERAPY ALERT CARD at check-in to the Emergency Department and triage nurse.  Should you have questions after your visit or need to cancel or reschedule your appointment, please contact [facility] - A DEPT OF [facility]  Dept: [phone]  and follow the prompts.  Office hours are 8:00 a.m. to 4:30 p.m. Monday - Friday. Please note that voicemails left after 4:00 p.m. may not be returned until the following business day.  We are closed weekends and major holidays. You have access to a nurse at all times for urgent questions. Please call the main number to the clinic Dept: [phone] and follow the prompts.   For any non-urgent questions, you may also contact your provider using MyChart. We now offer e-Visits for anyone 26 and older to request care online for non-urgent symptoms. For details visit [email or web address].   Also download the MyChart app! Go to the app store, search "MyChart", open the app, select [place], and log in with your MyChart username and password.  Bevacizumab Injection ?y l thu?c g? BEVACIZUMAB (??c l be va SIZ yoo mab) ?i?u tr? m?t s? lo?i ung th?. Thu?c ny tc d?ng b?ng cch ng?n ch?n m?t lo?i protein khi?n t? bo ung th? pht tri?n v nhn ln. ?i?u ny gip lm ch?m ho?c ng?n ch?n s? ly lan c?a cc t? bo ung th?. ?y l khng th? ??n dng. Thu?c ny c th? ???c dng cho nh?ng m?c ?ch khc; hy h?i ng??i cung c?p d?ch v? y t? ho?c d??c s? c?a mnh, n?u qu  v? c th?c m?c. (CC) NHN HI?U PH? BI?N: Alymsys, Avastin, MVASI, Vegzalma, Zirabev Ti c?n ph?i bo cho ng??i cung c?p d?ch v? y t? c?a mnh ?i?u g tr??c khi dng thu?c ny? H? c?n bi?t li?u qu v? hi?n c b?t k? tnh tr?ng no sau ?y hay khng: C?c mu ?ng Ho ra mu ?ang gi?i ph?u ho?c gi?i ph?u m?i ?y Suy tim Huy?t a?p cao Ti?n s? c ch? thng gi?a 2 ho?c nhi?u b? ph?n c? th? th??ng khng thng nhau (l? r) Ti?n s? rch d? dy ho?c ru?t C protein  trong n??c ti?u c?a qu v? Pha?n ??ng b?t th???ng ho??c ph?n ?ng di? ??ng v??i bevacizumab, ca?c d??c ph?m kha?c, th?c ph?m, thu?c nhu?mu, ho??c ch?t ba?o qua?n ?ang c thai ho??c ??nh co? thai ?ang cho con bu? Ti nn s? d?ng thu?c ny nh? th? no? Thu?c ny ?? tim vo t?nh m?ch. Thu?c ny ???c ??i ng? ch?m [place] c?a qu v? cho dng t?i b?nh vi?n ho?c phng m?ch. Hy h?i ??i ng? ch?m [place] c?a qu v? v? vi?c dng thu?c ny ? tr? em. C th? c?n ch?m [place] ??c bi?t. Qu li?u: N?u qu v? cho r?ng mnh ? dng qu nhi?u thu?c ny, th hy lin l?c v?i trung tm ki?m sot ch?t ??c ho?c phng c?p c?u ngay l?p t?c.<br />L?U : Thu?c ny ch? dnh ring cho qu v?. Khng chia s? thu?c ny v?i nh?ng ng??i khc. N?u ti l? qun m?t li?u th sao? Hy ??n cc cu?c h?n khm ?? nh?n cc li?u thu?c ti?p theo. ?i?u quan tr?ng l khng nn b? l? li?u thu?c c?a qu v?. Hy g?i cho ??i ng? ch?m [place] c?a mnh n?u qu v? khng th? ??n m?t cu?c h?n khm. Nh?ng g c th? t??ng tc v?i thu?c ny? Cc t??ng tc v?i thu?c khng x?y ra. Danh sch ny c th? khng m t? ?? h?t cc t??ng tc c th? x?y ra. Hy ??a cho ng??i cung c?p d?ch v? y t? c?a mnh danh sch t?t c? cc thu?c, th?o d??c, cc thu?c khng c?n toa, ho?c cc ch? ph?m b? sung m qu v? dng. C?ng nn bo cho h? bi?t r?ng qu v? c ht thu?c, u?ng r??u, ho?c c s? d?ng ma ty tri php hay khng. Vi th? c th? t??ng tc v?i thu?c c?a qu v?. Ti c?n ph?i theo di ?i?u g trong khi dng thu?c ny? Tnh tr?ng c?a qu v? se? ????c theo do?i c?n th?n trong khi qu v? ?ang du?ng thu?c na?y. Qu v? c th? c?n ?i lm xt nghi?m mu trong khi ?ang dng thu?c ny. Thu?c ny c th? lm cho qu v? c?m th?y khng ???c kh?e nh? th??ng l?. ?i?u ny khng ph?i l hi?m g?p, v thu?c ha tr? li?u c th? tc ??ng ln t? bo lnh l?n t? bo ung th?. Hy t??ng trnh m?i tc d?ng ph?. Hy ti?p t?c ??t ?i?u tr? c?a mnh ngay c? khi qu v? c?m th?y m?t, tr? khi ??i ng? ch?m [place] yu  c?u qu v? ng?ng ?i?u tr?. Thu?c ny c th? lm t?ng nguy c? b? b?m tm ho?c ch?y mu. Hy g?i cho ??i ng? ch?m [place], n?u qu v? ??  th?y b?t k? tnh tr?ng ch?y mu b?t th??ng no. Tr??c khi gi?i ph?u, hy bn b?c v?i ??i ng? ch?m [place] c?a qu v? ?? ??m b?o c th? ti?n hnh gi?i ph?u. Thu?c ny c th? lm t?ng nguy c? v?t  th??ng ho?c v?t m? ch?m lnh. Qu v? s? c?n ng?ng dng thu?c ny 28 ngy tr??c khi gi?i ph?u. Sau khi gi?i ph?u, hy ??i t nh?t 28 ngy tr??c khi b?t ??u dng l?i thu?c ny. ??m b?o v?t m? ho?c v?t th??ng ? ?? lnh tr??c khi b?t ??u dng l?i thu?c ny. Hy h?i ??i ng? ch?m [place] c?a mnh, n?u c th?c m?c. Hy h?i ??i ng? ch?m [place] c?a mnh, n?u qu v? c [place]. Thu?c ny c th? gy d? t?t b?m sinh nghim tr?ng n?u dng trong [place] k? v trong 6 thng sau li?u thu?c cu?i cng. Nn trnh [place] trong khi dng thu?c ny v trong 6 thng sau li?u thu?c cu?i cng. ??i ng? ch?m [place] c th? gip qu v? tm ra l?a ch?n ph h?p v?i qu v?. Khng ???c cho tr? b m? trong khi dng thu?c ny v trong vng 6 thng sau khi dng li?u thu?c cu?i cng. Thu?c ny c th? gy v sinh. Hy th?o lu?n v?i ??i ng? ch?m [place] c?a mnh, n?u qu v? lo l??ng v? kha? n?ng sinh s?n c?a mnh. Ti c th? nh?n th?y nh?ng tc d?ng ph? no khi dng thu?c ny? Nh?ng tc d?ng ph? qu v? c?n ph?i bo cho ??i ng? ch?m [place] cng s?m cng t?t: Cc ph?n ?ng d? ?ng -- da b? n?i ban, ng?a, n?i my ?ay, s?ng ? m?t, mi, l??i, ho?c h?ng Xu?t huy?t -- phn c mu ho?c mu ?en, mu h?c n, i ra mu ho?c ch?t mu nu gi?ng b c ph, n??c ti?u mu ?? ho?c nu s?m, cc ??m nh? mu ?? ho?c tm trn da, b?m tm ho?c ch?y mu b?t th??ng C?c mu ?ng -- ?au, s?ng ho?c nng ? chn, kh th?, ?au ng?c Nh?i mu c? tim -- ?au ho?c t?c ? ng?c, vai, cnh tay ho?c hm, bu?n i, kh th?, da l?nh ho?c ?m ??t, c?m th?y y?u ?t ho?c chong vng Suy tim -- kh th?, s?ng m?t c chn, bn chn ho?c bn tay, t?ng cn ??t ng?t, y?u ?t ho?c m?t m?i khc th??ng T?ng  huy?t p Nhi?m trng -- s?t, ?n l?nh, ho, ?au h?ng, v?t th??ng khng lnh, ?au khi ?i ti?u ho?c kh ?i ti?u, c?m gic kh ch?u chung ho?c c?m th?y khng kh?e Ph?n ?ng truy?n d?ch -- ?au ng?c, kh th? ho?c kh th?, c?m th?y y?u ?t ho?c chong vng T?n th??ng th?n -- gi?m l??ng n??c ti?u, s?ng ? m?t c chn, bn tay ho?c bn chn ?au bao t? d? d?i, khng h?t ho?c tr? nn t?i t? h?n ??t qu? -- ??t ng?t b? t ho?c y?u ? m?t, cnh tay ho?c chn, ni kh, l l?n, kh ?i l?i, m?t th?ng b?ng ho?c ph?i h?p ??ng tc, chng m?t, ?au ??u d? d?i, thay ??i th? l?c ?au ??u ??t ng?t v d? d?i, l l?n, thay ??i th? l?c, co gi?t, c th? l d?u hi?u c?a h?i ch?ng b?nh no sau c h?i ph?c (PRES) Cc tc d?ng ph? th??ng khng c?n ph?i ch?m [place] y t? (hy bo cho ??i ng? ch?m [place] n?u cc tc d?ng ph? ny ti?p di?n ho?c gy phi?n toi): ?au l?ng Thay ??i v? gic Tiu ch?y Da kh Ch?y nhi?u n??c m?t Ch?y mu cam Danh sch ny c th? khng m t? ?? h?t cc tc d?ng ph? c th? x?y ra. Xin g?i t?i bc s? c?a mnh ?? ???c c? v?n chuyn mn v? cc tc d?ng  ph?. Qu v? c th? t??ng trnh cc tc d?ng ph? cho FDA theo s? [phone]. Ti nn c?t gi? thu?c c?a mnh ? ?u? Thu?c ny th??ng ???c cho dng t?i b?nh vi?n ho?c phng m?ch. Thu?c ny khng ???c c?t gi? t?i nh. L?U : ?y l b?n tm t?t. N c th? khng bao hm t?t c? thng tin c th? c. N?u qu v? th?c m?c v? thu?c ny, xin trao ??i v?i bc s?, d??c s?, ho?c ng??i cung c?p d?ch v? y t? c?a mnh.  2024 Elsevier/Gold Standard (2022-05-24 00:00:00)

## 2023-12-18 ENCOUNTER — Other Ambulatory Visit (HOSPITAL_COMMUNITY): Payer: Self-pay

## 2023-12-18 ENCOUNTER — Other Ambulatory Visit: Payer: Self-pay

## 2023-12-18 ENCOUNTER — Ambulatory Visit: Payer: Self-pay | Admitting: Hematology

## 2023-12-19 ENCOUNTER — Other Ambulatory Visit: Payer: Self-pay

## 2023-12-19 ENCOUNTER — Other Ambulatory Visit: Payer: Self-pay | Admitting: Nurse Practitioner

## 2023-12-19 ENCOUNTER — Telehealth: Payer: Self-pay

## 2023-12-19 DIAGNOSIS — K625 Hemorrhage of anus and rectum: Secondary | ICD-10-CM

## 2023-12-19 DIAGNOSIS — C20 Malignant neoplasm of rectum: Secondary | ICD-10-CM

## 2023-12-19 NOTE — Progress Notes (Signed)
 Referral placed through Epic for [facility] to see patient due to complaints of recent GI bleeding. She has seen dr. Nandigam in the past. She has appointment scheduled for 12/24/2023.  -Rande Bushy, NP

## 2023-12-19 NOTE — Telephone Encounter (Signed)
 Scheduled appointment with Dr. Nandigam's PA for 12/24/2023 @ 11:10 am.  Patient notified and transportation scheduled.  Raeford Bullion RN, Congregational Nurse [phone]

## 2023-12-20 ENCOUNTER — Other Ambulatory Visit (HOSPITAL_COMMUNITY): Payer: Self-pay

## 2023-12-20 ENCOUNTER — Other Ambulatory Visit: Payer: Self-pay

## 2023-12-20 NOTE — Congregational Nurse Program (Signed)
 Picked up potassium from [employer] and took it to patient.  Gave instructions per Dr. Maryalice Smaller to take 2 tablets daily for 3 days and then reduce to 1 tablet per day (with telephone assistance from interpreter [name].)  Patient was able to repeat instructions correctly back to us .  Reminded her of appointment with [facility] on Monday 12/24/23.  Raeford Bullion RN, Congregational Nurse [phone]

## 2023-12-20 NOTE — Progress Notes (Signed)
 Specialty Pharmacy Refill Coordination Note  Jillian Lutz is a 73 y.o. female contacted today regarding refills of specialty medication(s) Capecitabine  (XELODA ) Spoke with Orelia Binet, Congregational Nurse.  Patient requested Pickup at [facility] [place] date: 12/20/23   Medication will be filled on 12/20/23.

## 2023-12-24 ENCOUNTER — Other Ambulatory Visit: Payer: Self-pay | Admitting: Hematology

## 2023-12-24 ENCOUNTER — Other Ambulatory Visit: Payer: Self-pay | Admitting: Nurse Practitioner

## 2023-12-24 ENCOUNTER — Ambulatory Visit (INDEPENDENT_AMBULATORY_CARE_PROVIDER_SITE_OTHER): Admitting: Gastroenterology

## 2023-12-24 ENCOUNTER — Encounter: Payer: Self-pay | Admitting: Gastroenterology

## 2023-12-24 ENCOUNTER — Other Ambulatory Visit: Payer: Self-pay

## 2023-12-24 ENCOUNTER — Other Ambulatory Visit: Payer: Self-pay | Admitting: Student

## 2023-12-24 ENCOUNTER — Other Ambulatory Visit (HOSPITAL_COMMUNITY): Payer: Self-pay

## 2023-12-24 VITALS — BP 110/70 | HR 70 | Ht <= 58 in | Wt 104.2 lb

## 2023-12-24 DIAGNOSIS — K6289 Other specified diseases of anus and rectum: Secondary | ICD-10-CM

## 2023-12-24 DIAGNOSIS — K5900 Constipation, unspecified: Secondary | ICD-10-CM | POA: Diagnosis not present

## 2023-12-24 DIAGNOSIS — R933 Abnormal findings on diagnostic imaging of other parts of digestive tract: Secondary | ICD-10-CM | POA: Diagnosis not present

## 2023-12-24 DIAGNOSIS — C20 Malignant neoplasm of rectum: Secondary | ICD-10-CM

## 2023-12-24 DIAGNOSIS — I10 Essential (primary) hypertension: Secondary | ICD-10-CM

## 2023-12-24 DIAGNOSIS — K625 Hemorrhage of anus and rectum: Secondary | ICD-10-CM

## 2023-12-24 MED ORDER — LISINOPRIL-HYDROCHLOROTHIAZIDE 20-12.5 MG PO TABS
2.0000 | ORAL_TABLET | Freq: Every day | ORAL | 3 refills | Status: AC
  Filled 2023-12-24: qty 60, 30d supply, fill #0
  Filled 2024-02-11: qty 60, 30d supply, fill #1
  Filled 2024-02-29 – 2024-03-06 (×2): qty 60, 30d supply, fill #2
  Filled 2024-04-05: qty 60, 30d supply, fill #3
  Filled ????-??-??: fill #2

## 2023-12-24 MED ORDER — ONDANSETRON HCL 8 MG PO TABS
8.0000 mg | ORAL_TABLET | Freq: Three times a day (TID) | ORAL | 2 refills | Status: AC | PRN
  Filled 2023-12-24: qty 30, 10d supply, fill #0

## 2023-12-24 MED ORDER — TRAMADOL HCL 50 MG PO TABS
50.0000 mg | ORAL_TABLET | Freq: Four times a day (QID) | ORAL | 0 refills | Status: DC | PRN
  Filled 2023-12-24: qty 90, 23d supply, fill #0

## 2023-12-24 NOTE — Telephone Encounter (Signed)
 Medication sent to pharmacy

## 2023-12-24 NOTE — Patient Instructions (Addendum)
 Follow-up in 3 months with Dr Leonia Raman on : 02/29/24 at 3:00 pm   _______________________________________________________  If your blood pressure at your visit was 140/90 or greater, please contact your primary care physician to follow up on this.  _______________________________________________________  If you are age 73 or older, your body mass index should be between 23-30. Your Body mass index is 21.78 kg/m. If this is out of the aforementioned range listed, please consider follow up with your Primary Care Provider.  If you are age 70 or younger, your body mass index should be between 19-25. Your Body mass index is 21.78 kg/m. If this is out of the aformentioned range listed, please consider follow up with your Primary Care Provider.   ________________________________________________________  The [facility] providers would like to encourage you to use MYCHART to communicate with providers for non-urgent requests or questions.  Due to long hold times on the telephone, sending your provider a message by [facility] may be a faster and more efficient way to get a response.  Please allow 48 business hours for a response.  Please remember that this is for non-urgent requests.  _______________________________________________________  Thank you for choosing me and [facility].

## 2023-12-24 NOTE — Progress Notes (Addendum)
 Jillian Lutz [id number] 11-01-1950   Chief Complaint: GI bleeding, discuss colonoscopy  Referring Provider: Sheree Dieter, MD Primary GI MD: Dr. Leonia Raman  HPI: Jillian Lutz is a 73 y.o. female with past medical history of rectal cancer diagnosed 2022 s/p chemo and radiation with confirmed residual primary tumor and lung mets 2023 and on continued chemotherapy, HTN, DVT 2023 who presents today for a complaint of rectal bleeding and to discuss repeat colonoscopy.    Patient last seen in office 12/22/2021 by Reginal Capra, PA for constipation and follow up of rectal cancer with PET scan showing continued area of concern in the rectum.  Herbal remedies seem to help her more with constipation than over-the-counter treatments and she was advised to continue these.  She was scheduled for repeat colonoscopy and found to have recurrence of rectal cancer 01/31/2022.  Patient last seen by oncology 12/17/2023 with summary of cancer history below: - Initially diagnosed 11/2020 by colonoscopy for progressive rectal pain and bleeding, weight loss, and constipation. - Despite strong recommendation, patient firmly and repeatedly declined IV chemo and surgery. She agreed to chemoRT with Xeloda , and received the treatment 01/04/21 - 02/14/21. - She developed local cancer progression and lung mets in 11/2021 - She started Xeloda  on 02/16/2022, low dose oxaliplatin  was added on 05/11/22 (with cycle 4 of Xeloda )  - Chemo held since 07/2022 due to leg pain and recurrent nausea  - Restaging CT 09/07/2022 showed stable disease in rectum and lung, no new lesions.   - She had chemo break from Feb-May 2024 - Restart CAPOX with dose reduction 12/18/2022, not tolerating very well overall.  Oxaliplatin  was held on August 5. She is on oxaliplatin  every 6 weeks now.  - Restaging CT scan from 10/09/2023 showed stable rectal wall thickening, no other evidence of metastasis   Labs 12/17/2023 - CEA 19.6, hemoglobin 9.3 (stable),  platelets 209, potassium 3.2, ferritin 169  Scheduled for repeat CT chest/abdomen/pelvis 01/21/2024.   Patient speaks [place] and a video interpretation service is used for the visit. She is here today with her nurse Orelia Binet who helps in coordinating her care.  Patient states she had rectal bleeding from 5/16-5/20/2025.  She would have a feeling of urgency like she needed to have a bowel movement, but when she went to the bathroom would just pass blood.  She has not had any recurrence of bleeding in over 2 weeks.  She denies any associated symptoms with the bleeding.  Denies rectal pain, abdominal pain, diarrhea.  At the time she was having bleeding, she noticed she was only having a bowel movement every 2 to 3 days.  She is now having a bowel movement daily.  She reports recent history of constipation, for which she is taking MiraLAX as needed.  She has also been drinking a smoothie every day to help regulate her bowels.  She denies any upper GI symptoms including nausea, vomiting, acid reflux, heartburn, dysphagia.   Previous GI Procedures/Imaging   CT chest/abdomen/pelvis 10/09/2023 1. Persistent circumferential wall thickening in the rectum, not substantially changed taking into account less rectal distention on the current study. 2. Marked stool volume throughout the colon proximal to the rectum, similar to minimally progressive in the interval. While this may reflect clinical constipation, a degree of stricture related to the rectal neoplasm cannot be excluded. 3. No findings on the current study to suggest progressive or metastatic disease. 4. Heterogeneous mineralization of the sacrum, as before. Nonspecific by CT, features may reflect old trauma.  CT chest/abdomen/pelvis 07/16/2023 - Circumferential nodular wall thickening again seen along the rectum consistent with known history of neoplasm with increasing adjacent stranding and thickening. - No frank obstruction but there  is large amount of colonic stool diffusely along the colon proximal which is increased from previous. Please correlate for a component of constipation. - No developing new mass lesion, fluid collection or lymph node enlargement. - Heterogeneous appearance once again to the sacrum. Please correlate for any history of insufficiency fractures or other process.  CT A/P 03/26/2023 1. Relatively similar appearance of rectal wall thickening and perirectal edema. Although the rectal wall thickening could be radiation induced, suspect residual neoplasm, given heterogeneous enhancement. 2. No findings of metastatic disease in the chest, abdomen, or pelvis. 3.  Possible constipation. 4. Incidental findings, including: Aortic Atherosclerosis (ICD10-I70.0). Pulmonary artery enlargement suggests pulmonary arterial hypertension.  Colonoscopy 01/31/2022 - Preparation of the colon was fair.  - Hemorrhoids found on perianal exam.  - Likely malignant partially obstructing tumor in the rectum. Biopsied.  - Non- bleeding external and internal hemorrhoids. Path: Rectum, biopsy INVASIVE MODERATELY DIFFERENTIATED ADENOCARCINOMA  PET scan 11/24/2021 1. Hypermetabolic left lower lobe pulmonary nodule with differential considerations of isolated pulmonary metastasis versus metachronous primary bronchogenic carcinoma. 2. Marked hypermetabolism corresponding to residual rectal wall thickening. The extent of rectal hypermetabolism favors residual disease over radiation change. 3. No other evidence of hypermetabolic metastasis. 4. Apparent bladder wall thickening could be due to underdistention, radiation induced cystitis, or infectious cystitis. Consider correlation with urinalysis.  MRI pelvis 12/09/2020 - 4.9 cm circumferential mid/lower rectal mass, corresponding to the patient's newly diagnosed rectal cancer. - Rectal adenocarcinoma T stage: T3c - Rectal adenocarcinoma N stage:  N1 - Distance from  tumor to the internal anal sphincter is 3.7 cm.  Colonoscopy 12/01/2020 - One 15 mm polyp in the ascending colon, removed with a hot snare. Resected and retrieved.  - One 25 mm polyp in the sigmoid colon, removed with a hot snare. Resected and retrieved.  - Likely malignant partially obstructing tumor in the proximal rectum. Biopsied. Tattooed. Path: 1. Colon, polyp(s), ascending x 1 - ADENOCARCINOMA ARISING IN A TUBULAR ADENOMA WITH HIGH-GRADE DYSPLASIA. SEE NOTE 2. Colon, sigmoid polyp, x1 - TUBULOVILLOUS ADENOMA(S) - NEGATIVE FOR HIGH-GRADE DYSPLASIA OR MALIGNANCY 3. Rectum, biopsy - ADENOCARCINOMA. SEE NOTE  Past Medical History:  Diagnosis Date   Bilateral wrist pain 10/25/2017   Hypertension    Left leg DVT (deep venous thrombosis) (HCC) 06/2022   Neck mass 1998   Unknown biopsy results. Excised in [place].   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   Rectal cancer (HCC)    Trigger finger, acquired 02/08/2017    Past Surgical History:  Procedure Laterality Date   BRONCHIAL BIOPSY  12/13/2021   Procedure: BRONCHIAL BIOPSIES;  Surgeon: Prudy Brownie, DO;  Location: [facility];  Service: Pulmonary;;   BRONCHIAL NEEDLE ASPIRATION BIOPSY  12/13/2021   Procedure: BRONCHIAL NEEDLE ASPIRATION BIOPSIES;  Surgeon: Prudy Brownie, DO;  Location: [facility];  Service: Pulmonary;;   IR IMAGING GUIDED PORT INSERTION  09/17/2023   NECK SURGERY Left 1998   Excision of mass in [place]   VIDEO BRONCHOSCOPY WITH RADIAL ENDOBRONCHIAL ULTRASOUND  12/13/2021   Procedure: RADIAL ENDOBRONCHIAL ULTRASOUND;  Surgeon: Prudy Brownie, DO;  Location: [facility];  Service: Pulmonary;;    Current Outpatient Medications  Medication Sig Dispense Refill   amLODipine  (NORVASC ) 5 MG tablet Take 1 tablet (5 mg total) by mouth  daily. 90 tablet 3   brimonidine  (ALPHAGAN ) 0.2 % ophthalmic solution Place 1 drop into the right eye 2 (two) times daily. 15 mL 3    brimonidine  (ALPHAGAN ) 0.2 % ophthalmic solution Place 1 drop into the right eye 2 (two) times daily. 10 mL 11   capecitabine  (XELODA ) 500 MG tablet Take 2 tabs every 12 hours, for 14 days then off for 7 days. Take 30mins after meal 56 tablet 2   dexamethasone  (DECADRON ) 4 MG tablet Take 1 tablet (4 mg total) by mouth daily. TAKE DAILY FOR 3-5 DAYS AFTER IV CHEMO 20 tablet 1   diclofenac  Sodium (VOLTAREN ) 1 % GEL Apply 4 grams topically 4 (four) times daily. 100 g 3   dorzolamide -timolol  (COSOPT ) 2-0.5 % ophthalmic solution Place 1 drop into both eyes 2 times daily. 30 mL 3   dorzolamide -timolol  (COSOPT ) 2-0.5 % ophthalmic solution Instill 1 drop into both eyes twice a day 10 mL 11   gabapentin  (NEURONTIN ) 100 MG capsule Take 1 capsule (100 mg total) by mouth 3 (three) times daily as needed. 90 capsule 2   lidocaine  (XYLOCAINE ) 5 % ointment Apply to anal area daily as needed. 50 g 1   lidocaine -prilocaine  (EMLA ) cream Apply topically daily as needed. 30 g 3   lisinopril -hydrochlorothiazide  (ZESTORETIC ) 20-12.5 MG tablet Take 2 tablets by mouth daily. 60 tablet 3   mirtazapine  (REMERON ) 30 MG tablet Take 1 tablet (30 mg total) by mouth at bedtime. 30 tablet 3   Multiple Vitamins-Minerals (MULTIVITAMIN WITH MINERALS) tablet Take 1 tablet by mouth daily. Gummy 50 mg     ondansetron  (ZOFRAN ) 8 MG tablet Take 1 tablet (8 mg total) by mouth every 8 (eight) hours as needed for nausea or vomiting. Take after 3 days of chemo 30 tablet 2   polyethylene glycol powder (GLYCOLAX/MIRALAX) 17 GM/SCOOP powder Take 1 Container by mouth once.     potassium chloride  (KLOR-CON  M) 10 MEQ tablet Take 1 tablet (10 mEq total) by mouth 2 (two) times daily. 60 tablet 2   prochlorperazine  (COMPAZINE ) 10 MG tablet Take 1 tablet (10 mg total) by mouth every 6 (six) hours as needed. 30 tablet 2   rivaroxaban  (XARELTO ) 20 MG TABS tablet Take 1 tablet (20 mg total) by mouth daily with supper. 30 tablet 2   traMADol  (ULTRAM ) 50 MG  tablet Take 1 tablet (50 mg total) by mouth every 6 (six) hours as needed. 90 tablet 0   urea  (CARMOL) 10 % cream Apply topically 2 times daily as needed. 85 g 3   No current facility-administered medications for this visit.    Allergies as of 12/24/2023 - Review Complete 12/17/2023  Allergen Reaction Noted   No known allergies  01/31/2022    Family History  Problem Relation Age of Onset   Headache Son    Colon cancer Neg Hx    Stomach cancer Neg Hx    Esophageal cancer Neg Hx     Social History   Tobacco Use   Smoking status: Never   Smokeless tobacco: Never  Vaping Use   Vaping status: Never Used  Substance Use Topics   Alcohol use: No   Drug use: No     Review of Systems:    Constitutional: No weight loss, fever, chills, weakness or fatigue Skin: No rash or itching Cardiovascular: No chest pain, chest pressure or palpitations   Respiratory: No SOB or cough Gastrointestinal: See HPI and otherwise negative Genitourinary: No dysuria or change in urinary frequency Neurological: No  headache, dizziness or syncope Musculoskeletal: No new muscle or joint pain Hematologic: No bruising    Physical Exam:  Vital signs: BP 110/70   Pulse 70   Ht 4' 10 (1.473 m)   Wt 104 lb 3.2 oz (47.3 kg)   SpO2 97%   BMI 21.78 kg/m    Wt Readings from Last 3 Encounters:  12/24/23 104 lb 3.2 oz (47.3 kg)  12/17/23 105 lb 11.2 oz (47.9 kg)  12/06/23 99 lb 12.8 oz (45.3 kg)     Constitutional: NAD, Well developed, Well nourished, alert and cooperative Head:  Normocephalic and atraumatic.  Eyes: No scleral icterus. Conjunctiva pink. Mouth: No oral lesions. Respiratory: Respirations even and unlabored. Lungs clear to auscultation bilaterally.  No wheezes, crackles, or rhonchi.  Cardiovascular:  Regular rate and rhythm. No murmurs. No peripheral edema. Gastrointestinal:  Soft, nondistended, nontender. No rebound or guarding. Normal bowel sounds. No appreciable masses or  hepatomegaly. Rectal:  Not performed.  Neurologic:  Alert and oriented x4;  grossly normal neurologically.  Skin:   Dry and intact without significant lesions or rashes. Psychiatric: Oriented to person, place and time. Demonstrates good judgement and reason without abnormal affect or behaviors.   RELEVANT LABS AND IMAGING: CBC    Component Value Date/Time   WBC 4.3 12/17/2023 1213   WBC 6.2 12/13/2021 1115   RBC 3.28 (L) 12/17/2023 1213   HGB 9.3 (L) 12/17/2023 1213   HGB 11.4 08/26/2020 1108   HCT 27.8 (L) 12/17/2023 1213   HCT 36.4 08/26/2020 1108   PLT 209 12/17/2023 1213   PLT 247 08/26/2020 1108   MCV 84.8 12/17/2023 1213   MCV 81 08/26/2020 1108   MCH 28.4 12/17/2023 1213   MCHC 33.5 12/17/2023 1213   RDW 20.9 (H) 12/17/2023 1213   RDW 14.2 08/26/2020 1108   LYMPHSABS 1.0 12/17/2023 1213   LYMPHSABS 1.4 08/26/2020 1108   MONOABS 0.5 12/17/2023 1213   EOSABS 0.1 12/17/2023 1213   EOSABS 0.2 08/26/2020 1108   BASOSABS 0.0 12/17/2023 1213   BASOSABS 0.1 08/26/2020 1108    CMP     Component Value Date/Time   NA 141 12/17/2023 1213   NA 139 09/29/2019 1406   K 3.2 (L) 12/17/2023 1213   CL 107 12/17/2023 1213   CO2 31 12/17/2023 1213   GLUCOSE 119 (H) 12/17/2023 1213   BUN 15 12/17/2023 1213   BUN 12 09/29/2019 1406   CREATININE 0.91 12/17/2023 1213   CALCIUM 8.3 (L) 12/17/2023 1213   PROT 6.1 (L) 12/17/2023 1213   PROT 6.6 06/22/2016 1004   ALBUMIN 3.3 (L) 12/17/2023 1213   ALBUMIN 4.1 06/22/2016 1004   AST 13 (L) 12/17/2023 1213   ALT <5 12/17/2023 1213   ALKPHOS 38 12/17/2023 1213   BILITOT 0.3 12/17/2023 1213   GFRNONAA >60 12/17/2023 1213   GFRAA 88 09/29/2019 1406     Assessment/Plan:   History of rectal cancer diagnosed 2022 s/p chemo and radiation with confirmed residual primary tumor and lung mets 2023 and on continued chemotherapy Rectal bleeding Constipation  Patient currently being treated for rectal cancer, having declined surgical  resection.  Recently had about 4 to 5 days of rectal bleeding which has since resolved, with no other associated symptoms.  She has had some constipation for which she has made dietary changes and is taking MiraLAX as needed.  There is question based on recent CT scan of whether this is clinical constipation or possibly due to a degree of stricture related  to rectal neoplasm. CT A/P 09/2023 shows persistent circumferential wall thickening in the rectum, but no findings to suggest progressive or metastatic disease.  Last colonoscopy 01/31/2022. Patient is scheduled for repeat CT chest/abdomen/pelvis 01/21/2024. Recent rectal bleeding possibly due to rectal tumor vs hemorrhoids.  - Will discuss with Dr. Nandigam regarding timing of next colonoscopy. - Contact patient's nurse Orelia Binet) to discuss plan.   Addendum:  I have discussed case and plan with Dr. Nandigam. At this time, repeat endoscopic exam is not necessarily indicated unless it would change the treatment plan for patient's rectal cancer. Patient has declined surgical intervention. If she has persistent bleeding, it may be amenable to endoscopic therapy, but this would need to be done in hospital setting.     Valiant Gaul, PA-C [facility] 12/24/2023, 8:28 AM  Patient Care Team: Sheree Dieter, MD as PCP - General Berna Breslow, RN (Inactive) as Oncology Nurse Navigator Burton, [name] K, NP as Nurse Practitioner (Nurse Practitioner) Sonja Zortman, MD as Consulting Physician (Hematology and Oncology) Johna Myers, MD as Consulting Physician (Radiation Oncology) [name], Albino Alu., MD as Consulting Physician (Gastroenterology) Icard, Lucie Ruts, DO (Inactive) as Consulting Physician (Pulmonary Disease) Candyce Champagne, MD as Consulting Physician (General Surgery)

## 2023-12-24 NOTE — Congregational Nurse Program (Signed)
 Accompanied patient to [facility] appointment with Valiant Gaul PA to evaluate and determine when she should get her next colonoscopy.  PA will discuss findings from today's visit with Dr. Leonia Raman and contact CN regarding recommendation.  After visit patient requested CN help her obtain the following medications: Mirtazapine , tramadol , zofran , and lisinopril /hydrochlorothiazide .  CN called [facility] who stated they can fill mirtazapine  but will need to get new prescriptions for the remaining meds. Called [facility] Medicaid transportation to take patient home.  Raeford Bullion RN, Congregational nurse [phone]

## 2023-12-25 ENCOUNTER — Other Ambulatory Visit: Payer: Self-pay

## 2023-12-26 ENCOUNTER — Other Ambulatory Visit (HOSPITAL_BASED_OUTPATIENT_CLINIC_OR_DEPARTMENT_OTHER): Payer: Self-pay

## 2023-12-26 ENCOUNTER — Other Ambulatory Visit (HOSPITAL_COMMUNITY): Payer: Self-pay

## 2023-12-26 NOTE — Congregational Nurse Program (Signed)
 Picked up the following meds from [facility] and delivered to patient:  tramadol , mirtazapine , lisinopril -hydrochlorothiazide  and zofran .  Arranged telephone interview with Avanell Bob from [facility] care at 2:00 pm today to enable interpreter [name] to be present during call.  Raeford Bullion RN, Congregational Nurse [phone]

## 2024-01-07 ENCOUNTER — Ambulatory Visit

## 2024-01-07 ENCOUNTER — Inpatient Hospital Stay

## 2024-01-07 ENCOUNTER — Inpatient Hospital Stay (HOSPITAL_BASED_OUTPATIENT_CLINIC_OR_DEPARTMENT_OTHER): Admitting: Hematology

## 2024-01-07 ENCOUNTER — Encounter: Payer: Self-pay | Admitting: Hematology

## 2024-01-07 VITALS — BP 140/73 | HR 64 | Temp 98.6°F | Resp 15 | Ht <= 58 in | Wt 106.5 lb

## 2024-01-07 DIAGNOSIS — C20 Malignant neoplasm of rectum: Secondary | ICD-10-CM

## 2024-01-07 DIAGNOSIS — I82432 Acute embolism and thrombosis of left popliteal vein: Secondary | ICD-10-CM

## 2024-01-07 DIAGNOSIS — Z95828 Presence of other vascular implants and grafts: Secondary | ICD-10-CM

## 2024-01-07 DIAGNOSIS — Z5111 Encounter for antineoplastic chemotherapy: Secondary | ICD-10-CM | POA: Diagnosis not present

## 2024-01-07 LAB — CBC WITH DIFFERENTIAL (CANCER CENTER ONLY)
Abs Immature Granulocytes: 0.03 10*3/uL (ref 0.00–0.07)
Basophils Absolute: 0 10*3/uL (ref 0.0–0.1)
Basophils Relative: 1 %
Eosinophils Absolute: 0.2 10*3/uL (ref 0.0–0.5)
Eosinophils Relative: 4 %
HCT: 29.9 % — ABNORMAL LOW (ref 36.0–46.0)
Hemoglobin: 10 g/dL — ABNORMAL LOW (ref 12.0–15.0)
Immature Granulocytes: 1 %
Lymphocytes Relative: 25 %
Lymphs Abs: 1.1 10*3/uL (ref 0.7–4.0)
MCH: 29.2 pg (ref 26.0–34.0)
MCHC: 33.4 g/dL (ref 30.0–36.0)
MCV: 87.2 fL (ref 80.0–100.0)
Monocytes Absolute: 0.5 10*3/uL (ref 0.1–1.0)
Monocytes Relative: 12 %
Neutro Abs: 2.4 10*3/uL (ref 1.7–7.7)
Neutrophils Relative %: 57 %
Platelet Count: 202 10*3/uL (ref 150–400)
RBC: 3.43 MIL/uL — ABNORMAL LOW (ref 3.87–5.11)
RDW: 19.6 % — ABNORMAL HIGH (ref 11.5–15.5)
WBC Count: 4.2 10*3/uL (ref 4.0–10.5)
nRBC: 0 % (ref 0.0–0.2)

## 2024-01-07 LAB — CMP (CANCER CENTER ONLY)
ALT: 5 U/L (ref 0–44)
AST: 12 U/L — ABNORMAL LOW (ref 15–41)
Albumin: 3.5 g/dL (ref 3.5–5.0)
Alkaline Phosphatase: 52 U/L (ref 38–126)
Anion gap: 5 (ref 5–15)
BUN: 16 mg/dL (ref 8–23)
CO2: 29 mmol/L (ref 22–32)
Calcium: 8.6 mg/dL — ABNORMAL LOW (ref 8.9–10.3)
Chloride: 107 mmol/L (ref 98–111)
Creatinine: 0.8 mg/dL (ref 0.44–1.00)
GFR, Estimated: >60 mL/min (ref >60)
Glucose, Bld: 109 mg/dL — ABNORMAL HIGH (ref 70–99)
Potassium: 3.3 mmol/L — ABNORMAL LOW (ref 3.5–5.1)
Sodium: 141 mmol/L (ref 135–145)
Total Bilirubin: 0.3 mg/dL (ref 0.0–1.2)
Total Protein: 6.7 g/dL (ref 6.5–8.1)

## 2024-01-07 LAB — CEA (ACCESS): CEA (CHCC): 23.05 ng/mL — ABNORMAL HIGH (ref 0.00–5.00)

## 2024-01-07 MED ORDER — OXALIPLATIN CHEMO INJECTION 100 MG/20ML
70.0000 mg/m2 | Freq: Once | INTRAVENOUS | Status: AC
  Administered 2024-01-07: 100 mg via INTRAVENOUS
  Filled 2024-01-07: qty 20

## 2024-01-07 MED ORDER — PALONOSETRON HCL INJECTION 0.25 MG/5ML
0.2500 mg | Freq: Once | INTRAVENOUS | Status: AC
  Administered 2024-01-07: 0.25 mg via INTRAVENOUS
  Filled 2024-01-07: qty 5

## 2024-01-07 MED ORDER — DEXAMETHASONE SODIUM PHOSPHATE 10 MG/ML IJ SOLN
10.0000 mg | Freq: Once | INTRAMUSCULAR | Status: AC
  Administered 2024-01-07: 10 mg via INTRAVENOUS
  Filled 2024-01-07: qty 1

## 2024-01-07 MED ORDER — DEXTROSE 5 % IV SOLN: Freq: Once | INTRAVENOUS | Status: AC

## 2024-01-07 MED ORDER — HEPARIN SOD (PORK) LOCK FLUSH 100 UNIT/ML IV SOLN
500.0000 [IU] | Freq: Once | INTRAVENOUS | Status: AC | PRN
  Administered 2024-01-07: 500 [IU]

## 2024-01-07 MED ORDER — FAMOTIDINE IN NACL 20-0.9 MG/50ML-% IV SOLN
20.0000 mg | Freq: Once | INTRAVENOUS | Status: AC
  Administered 2024-01-07: 20 mg via INTRAVENOUS
  Filled 2024-01-07: qty 50

## 2024-01-07 MED ORDER — SODIUM CHLORIDE 0.9% FLUSH
10.0000 mL | Freq: Once | INTRAVENOUS | Status: AC
  Administered 2024-01-07: 10 mL

## 2024-01-07 MED ORDER — SODIUM CHLORIDE 0.9 % IV SOLN: Freq: Once | INTRAVENOUS | Status: AC

## 2024-01-07 MED ORDER — SODIUM CHLORIDE 0.9% FLUSH
10.0000 mL | INTRAVENOUS | Status: DC | PRN
  Administered 2024-01-07: 10 mL

## 2024-01-07 MED ORDER — SODIUM CHLORIDE 0.9 % IV SOLN
7.5000 mg/kg | Freq: Once | INTRAVENOUS | Status: AC
  Administered 2024-01-07: 350 mg via INTRAVENOUS
  Filled 2024-01-07: qty 14

## 2024-01-07 MED ORDER — DIPHENHYDRAMINE HCL 50 MG/ML IJ SOLN
25.0000 mg | Freq: Once | INTRAMUSCULAR | Status: AC
  Administered 2024-01-07: 25 mg via INTRAVENOUS
  Filled 2024-01-07: qty 1

## 2024-01-07 NOTE — Progress Notes (Addendum)
 " [facility]   [phone] [phone]   Clinic Follow up Note   Patient Care Team: Burton, [name] K, NP as Nurse Practitioner (Nurse Practitioner) [name], MD as Consulting Physician (Hematology and Oncology) [name], MD as Consulting Physician (Radiation Oncology) [name], [name], MD as Consulting Physician (Gastroenterology) [name], DO (Inactive) as Consulting Physician (Pulmonary Disease) [name], MD as Consulting Physician (General Surgery)  Date of Service:  01/07/2024  CHIEF COMPLAINT: f/u of rectal cancer  CURRENT THERAPY:  CapeOx and bevacizumab   Oncology History   Left leg DVT (HCC) -Doppler on July 13, 2022 showed left acute DVT involving popliteal and peroneal vein, CTA chest was negative for PE. -continue Xarelto , tolerating well    Rectal adenocarcinoma (HCC) cT3cN1M0 stage IIIb, biopsy confirmed residual primary tumor and oligo lung met in 09/2021 -Initially diagnosed 11/2020 by colonoscopy for progressive rectal pain and bleeding, weight loss, and constipation. -Despite strong recommendation, patient firmly and repeatedly declined IV chemo and surgery. She agreed to chemoRT with Xeloda , and received the treatment 01/04/21 - 02/14/21. -she developed local cancer progression and lung mets in 11/2021 -she started Xeloda  on 02/16/2022, low dose oxaliplatin  was added on 05/11/22 (with cycle 4 of Xeloda )  -chemo held since 07/2022 due to leg pain and recurrent nausea  -restaging CT 09/07/2022 showed stable disease in rectum and lung, no new lesions.   -she had chemo break from Feb-May 2024 -restart CAPOX with dose reduction 12/18/2022, not tolerating very well overall.  Oxaliplatin  was held on August 5. She is on oxaliplatin  every 6 weeks now.  -Restaging CT scan from 10/09/2023 showed stable rectal wall thickening, no other  evidence of metastasis   Assessment & Plan Rectal cancer Rectal cancer under treatment  with capecitabine . No rectal bleeding or bowel movement issues. Improved energy levels. Tumor marker at 19 as of early June. Mild anemia with hemoglobin at 10, improved from three weeks ago. - Administer bevacizumab  and oxaliplatin  infusion today. - Instruct to take two capecitabine  pills tonight, two in the morning, and one tomorrow night to complete the cycle. - Schedule CT scan for July 7 to assess treatment response. - Review CT scan results on July 14. - Continue mirtazapine  for appetite and sleep.  Mild anemia Mild anemia with hemoglobin at 10, improved from three weeks ago.  Plan - Lab reviewed, adequate for treatment, will proceed bevacizumab  and oxaliplatin  today - She will continue Xeloda  at the same dose - Follow-up in 3 weeks, restaging CT scan scheduled in 2 weeks - She has moderate protein and calorie malnutrition, I encouraged her to have nutritional supplements such as Ensure or boost.   SUMMARY OF ONCOLOGIC HISTORY: Oncology History Overview Note  Cancer Staging Rectal adenocarcinoma [facility]) Staging form: Colon and Rectum, AJCC 8th Edition - Clinical stage from 12/21/2020: Stage IIIB (cT3, cN1, cM0) - Unsigned    Rectal adenocarcinoma (HCC)  08/26/2020 Miscellaneous   Initial presentation to PCP, reporting intermittent BRBPR since 02/2020    12/01/2020 Procedure   Colonoscopy by Dr. Shila findings - The perianal and digital rectal examinations were normal. - A 15 mm polyp was found in the ascending colon. The polyp was semi-pedunculated. Resection and retrieval were complete. - A 25 mm polyp was found in the sigmoid colon. The polyp was pedunculated. Resection and retrieval were complete. - An infiltrative partially obstructing large mass was found in the proximal rectum. The mass was partially circumferential (involving one-half of the lumen circumference). The mass  measured eight cm in length extending from 10 to 18cm from anal verge. This was biopsied with a cold  forceps for histology. Proximal and distal opposite fold area of the mass lesion was tattooed with an injection of total 3 mL of Spot (carbon black).   12/01/2020 Initial Biopsy   Diagnosis 1. Colon, polyp(s), ascending x 1 - ADENOCARCINOMA ARISING IN A TUBULAR ADENOMA WITH HIGH-GRADE DYSPLASIA. SEE NOTE 2. Colon, sigmoid polyp, x1 - TUBULOVILLOUS ADENOMA(S) - NEGATIVE FOR HIGH-GRADE DYSPLASIA OR MALIGNANCY 3. Rectum, biopsy - ADENOCARCINOMA. SEE NOTE   12/01/2020 Cancer Staging   Cancer Staging Rectal adenocarcinoma [facility]) Staging form: Colon and Rectum, AJCC 8th Edition - Clinical stage from 12/21/2020: Stage IIIB (cT3, cN1, cM0) - Unsigned    12/06/2020 Imaging   CT CAP with contrast IMPRESSION: 1. There is partially circumferential soft tissue thickening of the mid to superior rectum, approximately 5 cm in length and the inferior extent approximately 6 cm above the anal verge, consistent with primary rectal malignancy identified by colonoscopy. 2. There appear to be abnormally enlarged perirectal lymph nodes posteriorly about the superior rectum measuring up to 1.0 x 0.8 cm. Findings are suspicious for perirectal nodal metastatic disease, however rectal MRI is the test of choice for initial local staging of rectal cancer. 3. There is a 4 mm nonspecific pulmonary nodule of the superior segment left lower lobe, statistically most likely incidental, infectious or inflammatory, although nonspecific and isolated metastatic disease is not strictly excluded. Attention on follow-up. 4. No other evidence of metastatic disease in the chest, abdomen, or pelvis. Aortic Atherosclerosis (ICD10-I70.0).   12/09/2020 Imaging   Local staging MRI pelvis without contrast IMPRESSION: 4.9 cm circumferential mid/lower rectal mass, corresponding to the patient's newly diagnosed rectal cancer. Rectal adenocarcinoma T stage: T3c Rectal adenocarcinoma N stage:  N1 Distance from tumor to the internal  anal sphincter is 3.7 cm.   12/21/2020 Initial Diagnosis   Rectal adenocarcinoma (HCC)   01/04/2021 -  Chemotherapy   Concurrent chemoradiation with Xeloda  1000mg  in the AM and 1500mg  in the PM on days of Radiation starting 01/04/21.       01/04/2021 - 02/11/2021 Radiation Therapy   Concurrent chemoradiation with Dr Dewey and Xeloda  starting 01/04/21.    01/31/2022 Procedure   Colonoscopy, Dr. Shila  Findings: - An infiltrative partially obstructing large mass was found in the rectum. The mass was circumferential. The mass measured ten cm in length, extending from 5-15cm from anal verge. In addition, rectal diameter at narrowest approx twelve mm. Oozing was present. Biopsies were taken with a cold forceps for histology.  Impression: - Hemorrhoids found on perianal exam. - Likely malignant partially obstructing tumor in the rectum. Biopsied. - Non-bleeding external and internal hemorrhoids.   01/31/2022 Pathology Results   Diagnosis Rectum, biopsy INVASIVE MODERATELY DIFFERENTIATED ADENOCARCINOMA   05/11/2022 -  Chemotherapy   Patient is on Treatment Plan : COLORECTAL Xelox (Capeox)(130/850) q21d     09/07/2022 Imaging    IMPRESSION: 1. No significant change in circumferential wall thickening of the rectum, in keeping with known primary rectal mass. Similar appearance of post treatment perirectal and presacral fat stranding and fascial thickening 2. Unchanged nodule of the superior segment left lower lobe with adjacent bandlike scarring. No new nodules. 3. No evidence of lymphadenopathy or other metastatic disease in the chest, abdomen, or pelvis. 4. Large burden of stool throughout the colon. 5. Cardiomegaly.   12/08/2022 Imaging    IMPRESSION: Persistent wall thickening along the rectum with adjacent severe  inflammatory stranding and nodularity. Please correlate with exact location of the distribution of the patient's neoplasm.   No discrete new mass lesion, fluid  collection or lymph node enlargement.   Improved visualization of the fractures involving the left side of the pubic symphysis as well as probable insufficiency fractures along the sacrum. Associated areas of increasing heterogeneous bony sclerosis along the pubic bones and sacrum. In principle sclerotic bone metastases would be in the differential but are felt to be less likely based on the overall appearance. If needed confirmatory MRI can be considered as clinically appropriate to further delineate.   Stable scarring and fibrotic changes along the lungs with a tiny nodular area along the superior segment of the left lower lobe. Simple continued follow up.   Enlarged heart.   10/09/2023 Imaging   CT chest abdomen and pelvis with contrast  MPRESSION: 1. Persistent circumferential wall thickening in the rectum, not substantially changed taking into account less rectal distention on the current study. 2. Marked stool volume throughout the colon proximal to the rectum, similar to minimally progressive in the interval. While this may reflect clinical constipation, a degree of stricture related to the rectal neoplasm cannot be excluded. 3. No findings on the current study to suggest progressive or metastatic disease. 4. Heterogeneous mineralization of the sacrum, as before. Nonspecific by CT, features may reflect old trauma.      Discussed the use of AI scribe software for clinical note transcription with the patient, who gave verbal consent to proceed.  History of Present Illness Jillian Lutz is a 73 year old female with rectal cancer who presents for follow-up.  No rectal bleeding or bowel movement issues. She is currently taking Xeloda  (capecitabine ) as oral chemotherapy, with five pills remaining. She plans to complete the course by tomorrow night. There was a discrepancy in the pill count, possibly due to a missed dose or delayed start. No adverse effects from the chemotherapy  pills. Her energy levels have improved significantly compared to before.  She inquired about the long-term use of mirtazapine , which she takes at night for appetite and sleep, and she tolerates it well.  Her blood counts today show a hemoglobin level of 10, indicating mild anemia.     All other systems were reviewed with the patient and are negative.  MEDICAL HISTORY:  Past Medical History:  Diagnosis Date   Bilateral wrist pain 10/25/2017   Hypertension    Left leg DVT (deep venous thrombosis) (HCC) 06/2022   Neck mass 1998   Unknown biopsy results. Excised in [place].   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   Rectal cancer (HCC)    Trigger finger, acquired 02/08/2017    SURGICAL HISTORY: Past Surgical History:  Procedure Laterality Date   BRONCHIAL BIOPSY  12/13/2021   Procedure: BRONCHIAL BIOPSIES;  Surgeon: [name], DO;  Location: [facility];  Service: Pulmonary;;   BRONCHIAL NEEDLE ASPIRATION BIOPSY  12/13/2021   Procedure: BRONCHIAL NEEDLE ASPIRATION BIOPSIES;  Surgeon: [name], DO;  Location: [facility];  Service: Pulmonary;;   IR IMAGING GUIDED PORT INSERTION  09/17/2023   NECK SURGERY Left 1998   Excision of mass in [place]   VIDEO BRONCHOSCOPY WITH RADIAL ENDOBRONCHIAL ULTRASOUND  12/13/2021   Procedure: RADIAL ENDOBRONCHIAL ULTRASOUND;  Surgeon: [name], DO;  Location: [facility];  Service: Pulmonary;;    I have reviewed the social history and family history with the patient and they are unchanged from previous  note.  ALLERGIES:  is allergic to no known allergies.  MEDICATIONS:  Current Outpatient Medications  Medication Sig Dispense Refill   amLODipine  (NORVASC ) 5 MG tablet Take 1 tablet (5 mg total) by mouth daily. 90 tablet 3   brimonidine  (ALPHAGAN ) 0.2 % ophthalmic solution Place 1 drop into the right eye 2 (two) times daily. 10 mL 11   capecitabine  (XELODA ) 500 MG tablet Take 2 tabs every 12  hours, for 14 days then off for 7 days. Take 30mins after meal 56 tablet 2   dexamethasone  (DECADRON ) 4 MG tablet Take 1 tablet (4 mg total) by mouth daily. TAKE DAILY FOR 3-5 DAYS AFTER IV CHEMO 20 tablet 1   diclofenac  Sodium (VOLTAREN ) 1 % GEL Apply 4 grams topically 4 (four) times daily. 100 g 3   dorzolamide -timolol  (COSOPT ) 2-0.5 % ophthalmic solution Instill 1 drop into both eyes twice a day 10 mL 11   gabapentin  (NEURONTIN ) 100 MG capsule Take 1 capsule (100 mg total) by mouth 3 (three) times daily as needed. 90 capsule 2   lidocaine  (XYLOCAINE ) 5 % ointment Apply to anal area daily as needed. 50 g 1   lidocaine -prilocaine  (EMLA ) cream Apply topically daily as needed. 30 g 3   lisinopril -hydrochlorothiazide  (ZESTORETIC ) 20-12.5 MG tablet Take 2 tablets by mouth daily. 60 tablet 3   mirtazapine  (REMERON ) 30 MG tablet Take 1 tablet (30 mg total) by mouth at bedtime. 30 tablet 3   Multiple Vitamins-Minerals (MULTIVITAMIN WITH MINERALS) tablet Take 1 tablet by mouth daily. Gummy 50 mg     ondansetron  (ZOFRAN ) 8 MG tablet Take 1 tablet (8 mg total) by mouth every 8 (eight) hours as needed for nausea or vomiting. Take after 3 days of chemo 30 tablet 2   polyethylene glycol powder (GLYCOLAX/MIRALAX) 17 GM/SCOOP powder Take 1 Container by mouth once.     potassium chloride  (KLOR-CON  M) 10 MEQ tablet Take 1 tablet (10 mEq total) by mouth 2 (two) times daily. (Patient taking differently: Take 10 mEq by mouth daily.) 60 tablet 2   prochlorperazine  (COMPAZINE ) 10 MG tablet Take 1 tablet (10 mg total) by mouth every 6 (six) hours as needed. 30 tablet 2   rivaroxaban  (XARELTO ) 20 MG TABS tablet Take 1 tablet (20 mg total) by mouth daily with supper. 30 tablet 2   traMADol  (ULTRAM ) 50 MG tablet Take 1 tablet (50 mg total) by mouth every 6 (six) hours as needed. 90 tablet 0   urea  (CARMOL) 10 % cream Apply topically 2 times daily as needed. 85 g 3   No current facility-administered medications for this  visit.   Facility-Administered Medications Ordered in Other Visits  Medication Dose Route Frequency Provider Last Rate Last Admin   sodium chloride  flush (NS) 0.9 % injection 10 mL  10 mL Intracatheter PRN [name], MD   10 mL at 01/07/24 1606    PHYSICAL EXAMINATION: ECOG PERFORMANCE STATUS: 1 - Symptomatic but completely ambulatory  Vitals:   01/07/24 1157  BP: (!) 140/73  Pulse: 64  Resp: 15  Temp: 98.6 F (37 C)  SpO2: 100%   Wt Readings from Last 3 Encounters:  01/07/24 106 lb 8 oz (48.3 kg)  12/24/23 104 lb 3.2 oz (47.3 kg)  12/17/23 105 lb 11.2 oz (47.9 kg)     GENERAL:alert, no distress and comfortable SKIN: skin color, texture, turgor are normal, no rashes or significant lesions EYES: normal, Conjunctiva are pink and non-injected, sclera clear NECK: supple, thyroid normal size, non-tender, without nodularity  LYMPH:  no palpable lymphadenopathy in the cervical, axillary  LUNGS: clear to auscultation and percussion with normal breathing effort HEART: regular rate & rhythm and no murmurs and no lower extremity edema ABDOMEN:abdomen soft, non-tender and normal bowel sounds Musculoskeletal:no cyanosis of digits and no clubbing  NEURO: alert & oriented x 3 with fluent speech, no focal motor/sensory deficits  Physical Exam    LABORATORY DATA:  I have reviewed the data as listed    Latest Ref Rng & Units 01/07/2024   11:20 AM 12/17/2023   12:13 PM 11/27/2023    9:42 AM  CBC  WBC 4.0 - 10.5 K/uL 4.2  4.3  3.8   Hemoglobin 12.0 - 15.0 g/dL 89.9  9.3  7.1   Hematocrit 36.0 - 46.0 % 29.9  27.8  21.3   Platelets 150 - 400 K/uL 202  209  200         Latest Ref Rng & Units 01/07/2024   11:20 AM 12/17/2023   12:13 PM 11/27/2023    9:42 AM  CMP  Glucose 70 - 99 mg/dL 890  880  885   BUN 8 - 23 mg/dL 16  15  16    Creatinine 0.44 - 1.00 mg/dL 9.19  9.08  9.20   Sodium 135 - 145 mmol/L 141  141  138   Potassium 3.5 - 5.1 mmol/L 3.3  3.2  3.5   Chloride 98 - 111 mmol/L  107  107  101   CO2 22 - 32 mmol/L 29  31  32   Calcium 8.9 - 10.3 mg/dL 8.6  8.3  8.4   Total Protein 6.5 - 8.1 g/dL 6.7  6.1  6.6   Total Bilirubin 0.0 - 1.2 mg/dL 0.3  0.3  0.4   Alkaline Phos 38 - 126 U/L 52  38  46   AST 15 - 41 U/L 12  13  13    ALT 0 - 44 U/L 5  <5  <5       RADIOGRAPHIC STUDIES: I have personally reviewed the radiological images as listed and agreed with the findings in the report. No results found.    Orders Placed This Encounter  Procedures   CBC with Differential (Cancer Center Only)    Standing Status:   Future    Expected Date:   03/31/2024    Expiration Date:   03/31/2025   CMP (Cancer Center only)    Standing Status:   Future    Expected Date:   03/31/2024    Expiration Date:   03/31/2025   CBC with Differential (Cancer Center Only)    Standing Status:   Future    Expected Date:   04/21/2024    Expiration Date:   04/21/2025   All questions were answered. The patient knows to call the clinic with any problems, questions or concerns. No barriers to learning was detected. The total time spent in the appointment was 25 minutes, including review of chart and various tests results, discussions about plan of care and coordination of care plan     Onita Mattock, MD 01/07/2024     "

## 2024-01-07 NOTE — Progress Notes (Signed)
 Nutrition Follow-up:  Patient with rectal cancer.  Patient is on bevacizumab  and oxaliplatin .    Met with patient and interpreter during infusion.  Patient reports that her appetite is good and she is eating 3 meals a day.  Drinking 2-3 cups of ensure daily(mixed with whole milk).  Breakfast is Milk/ensure with bread.  Lunch is white rice and vegetables and supper is similar.  Patient does eat chicken, fish and some beef.  Denies any stomach upset.      Medications: reviewed  Labs: K 3.3  Anthropometrics:   Weight 106 lb 8 oz improved  99 lb 12.8 oz on 5/22  100 lb on 4/24 103 lb 14.4 oz on 3/11 100 lb 4/8 oz on 1 27/25   NUTRITION DIAGNOSIS: Food and nutrition related knowledge deficit improved    INTERVENTION:  Recommend boost/ensure plus 2-3 times a day for added calories and protein to prevent weight loss Encouraged foods rich in protein Request sent to MD team regarding assistance from [employer] with oral nutrition supplements RD available as needed    MONITORING, EVALUATION, GOAL: weight trends, intake   NEXT VISIT: as needed  Aniket Paye B. Dasie SOLON, CSO, LDN Registered Dietitian [phone]

## 2024-01-07 NOTE — Assessment & Plan Note (Signed)
-  Doppler on July 13, 2022 showed left acute DVT involving popliteal and peroneal vein, CTA chest was negative for PE. -continue Xarelto, tolerating well

## 2024-01-07 NOTE — Assessment & Plan Note (Signed)
 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 -Despite strong recommendation, patient firmly and repeatedly declined IV chemo and surgery. She agreed to chemoRT with Xeloda , and received the treatment 01/04/21 - 02/14/21. -she developed local cancer progression and lung mets in 11/2021 -she started Xeloda  on 02/16/2022, low dose oxaliplatin  was added on 05/11/22 (with cycle 4 of Xeloda )  -chemo held since 07/2022 due to leg pain and recurrent nausea  -restaging CT 09/07/2022 showed stable disease in rectum and lung, no new lesions.   -she had chemo break from Feb-May 2024 -restart CAPOX with dose reduction 12/18/2022, not tolerating very well overall.  Oxaliplatin  was held on August 5. She is on oxaliplatin  every 6 weeks now.  -Restaging CT scan from 10/09/2023 showed stable rectal wall thickening, no other  evidence of metastasis

## 2024-01-07 NOTE — Patient Instructions (Signed)
 [facility] - A DEPT OF [facility]  Discharge Instructions: Thank you for choosing [facility] to provide your oncology and hematology care.   If you have a lab appointment with the Cancer Center, please go directly to the Cancer Center and check in at the registration area.   Wear comfortable clothing and clothing appropriate for easy access to any Portacath or PICC line.   We strive to give you quality time with your provider. You may need to reschedule your appointment if you arrive late (15 or more minutes).  Arriving late affects you and other patients whose appointments are after yours.  Also, if you miss three or more appointments without notifying the office, you may be dismissed from the clinic at the provider's discretion.      For prescription refill requests, have your pharmacy contact our office and allow 72 hours for refills to be completed.    Today you received the following chemotherapy and/or immunotherapy agents: Bevacizumab      To help prevent nausea and vomiting after your treatment, we encourage you to take your nausea medication as directed.  BELOW ARE SYMPTOMS THAT SHOULD BE REPORTED IMMEDIATELY: *FEVER GREATER THAN 100.4 F (38 C) OR HIGHER *CHILLS OR SWEATING *NAUSEA AND VOMITING THAT IS NOT CONTROLLED WITH YOUR NAUSEA MEDICATION *UNUSUAL SHORTNESS OF BREATH *UNUSUAL BRUISING OR BLEEDING *URINARY PROBLEMS (pain or burning when urinating, or frequent urination) *BOWEL PROBLEMS (unusual diarrhea, constipation, pain near the anus) TENDERNESS IN MOUTH AND THROAT WITH OR WITHOUT PRESENCE OF ULCERS (sore throat, sores in mouth, or a toothache) UNUSUAL RASH, SWELLING OR PAIN  UNUSUAL VAGINAL DISCHARGE OR ITCHING   Items with * indicate a potential emergency and should be followed up as soon as possible or go to the Emergency Department if any problems should occur.  Please show the CHEMOTHERAPY ALERT CARD or  IMMUNOTHERAPY ALERT CARD at check-in to the Emergency Department and triage nurse.  Should you have questions after your visit or need to cancel or reschedule your appointment, please contact [facility] - A DEPT OF [facility]  Dept: [phone]  and follow the prompts.  Office hours are 8:00 a.m. to 4:30 p.m. Monday - Friday. Please note that voicemails left after 4:00 p.m. may not be returned until the following business day.  We are closed weekends and major holidays. You have access to a nurse at all times for urgent questions. Please call the main number to the clinic Dept: [phone] and follow the prompts.   For any non-urgent questions, you may also contact your provider using MyChart. We now offer e-Visits for anyone 26 and older to request care online for non-urgent symptoms. For details visit [email or web address].   Also download the MyChart app! Go to the app store, search "MyChart", open the app, select [place], and log in with your MyChart username and password.  Bevacizumab Injection ?y l thu?c g? BEVACIZUMAB (??c l be va SIZ yoo mab) ?i?u tr? m?t s? lo?i ung th?. Thu?c ny tc d?ng b?ng cch ng?n ch?n m?t lo?i protein khi?n t? bo ung th? pht tri?n v nhn ln. ?i?u ny gip lm ch?m ho?c ng?n ch?n s? ly lan c?a cc t? bo ung th?. ?y l khng th? ??n dng. Thu?c ny c th? ???c dng cho nh?ng m?c ?ch khc; hy h?i ng??i cung c?p d?ch v? y t? ho?c d??c s? c?a mnh, n?u qu  v? c th?c m?c. (CC) NHN HI?U PH? BI?N: Alymsys, Avastin, MVASI, Vegzalma, Zirabev Ti c?n ph?i bo cho ng??i cung c?p d?ch v? y t? c?a mnh ?i?u g tr??c khi dng thu?c ny? H? c?n bi?t li?u qu v? hi?n c b?t k? tnh tr?ng no sau ?y hay khng: C?c mu ?ng Ho ra mu ?ang gi?i ph?u ho?c gi?i ph?u m?i ?y Suy tim Huy?t a?p cao Ti?n s? c ch? thng gi?a 2 ho?c nhi?u b? ph?n c? th? th??ng khng thng nhau (l? r) Ti?n s? rch d? dy ho?c ru?t C protein  trong n??c ti?u c?a qu v? Pha?n ??ng b?t th???ng ho??c ph?n ?ng di? ??ng v??i bevacizumab, ca?c d??c ph?m kha?c, th?c ph?m, thu?c nhu?mu, ho??c ch?t ba?o qua?n ?ang c thai ho??c ??nh co? thai ?ang cho con bu? Ti nn s? d?ng thu?c ny nh? th? no? Thu?c ny ?? tim vo t?nh m?ch. Thu?c ny ???c ??i ng? ch?m [place] c?a qu v? cho dng t?i b?nh vi?n ho?c phng m?ch. Hy h?i ??i ng? ch?m [place] c?a qu v? v? vi?c dng thu?c ny ? tr? em. C th? c?n ch?m [place] ??c bi?t. Qu li?u: N?u qu v? cho r?ng mnh ? dng qu nhi?u thu?c ny, th hy lin l?c v?i trung tm ki?m sot ch?t ??c ho?c phng c?p c?u ngay l?p t?c.<br />L?U : Thu?c ny ch? dnh ring cho qu v?. Khng chia s? thu?c ny v?i nh?ng ng??i khc. N?u ti l? qun m?t li?u th sao? Hy ??n cc cu?c h?n khm ?? nh?n cc li?u thu?c ti?p theo. ?i?u quan tr?ng l khng nn b? l? li?u thu?c c?a qu v?. Hy g?i cho ??i ng? ch?m [place] c?a mnh n?u qu v? khng th? ??n m?t cu?c h?n khm. Nh?ng g c th? t??ng tc v?i thu?c ny? Cc t??ng tc v?i thu?c khng x?y ra. Danh sch ny c th? khng m t? ?? h?t cc t??ng tc c th? x?y ra. Hy ??a cho ng??i cung c?p d?ch v? y t? c?a mnh danh sch t?t c? cc thu?c, th?o d??c, cc thu?c khng c?n toa, ho?c cc ch? ph?m b? sung m qu v? dng. C?ng nn bo cho h? bi?t r?ng qu v? c ht thu?c, u?ng r??u, ho?c c s? d?ng ma ty tri php hay khng. Vi th? c th? t??ng tc v?i thu?c c?a qu v?. Ti c?n ph?i theo di ?i?u g trong khi dng thu?c ny? Tnh tr?ng c?a qu v? se? ????c theo do?i c?n th?n trong khi qu v? ?ang du?ng thu?c na?y. Qu v? c th? c?n ?i lm xt nghi?m mu trong khi ?ang dng thu?c ny. Thu?c ny c th? lm cho qu v? c?m th?y khng ???c kh?e nh? th??ng l?. ?i?u ny khng ph?i l hi?m g?p, v thu?c ha tr? li?u c th? tc ??ng ln t? bo lnh l?n t? bo ung th?. Hy t??ng trnh m?i tc d?ng ph?. Hy ti?p t?c ??t ?i?u tr? c?a mnh ngay c? khi qu v? c?m th?y m?t, tr? khi ??i ng? ch?m [place] yu  c?u qu v? ng?ng ?i?u tr?. Thu?c ny c th? lm t?ng nguy c? b? b?m tm ho?c ch?y mu. Hy g?i cho ??i ng? ch?m [place], n?u qu v? ??  th?y b?t k? tnh tr?ng ch?y mu b?t th??ng no. Tr??c khi gi?i ph?u, hy bn b?c v?i ??i ng? ch?m [place] c?a qu v? ?? ??m b?o c th? ti?n hnh gi?i ph?u. Thu?c ny c th? lm t?ng nguy c? v?t  th??ng ho?c v?t m? ch?m lnh. Qu v? s? c?n ng?ng dng thu?c ny 28 ngy tr??c khi gi?i ph?u. Sau khi gi?i ph?u, hy ??i t nh?t 28 ngy tr??c khi b?t ??u dng l?i thu?c ny. ??m b?o v?t m? ho?c v?t th??ng ? ?? lnh tr??c khi b?t ??u dng l?i thu?c ny. Hy h?i ??i ng? ch?m [place] c?a mnh, n?u c th?c m?c. Hy h?i ??i ng? ch?m [place] c?a mnh, n?u qu v? c [place]. Thu?c ny c th? gy d? t?t b?m sinh nghim tr?ng n?u dng trong [place] k? v trong 6 thng sau li?u thu?c cu?i cng. Nn trnh [place] trong khi dng thu?c ny v trong 6 thng sau li?u thu?c cu?i cng. ??i ng? ch?m [place] c th? gip qu v? tm ra l?a ch?n ph h?p v?i qu v?. Khng ???c cho tr? b m? trong khi dng thu?c ny v trong vng 6 thng sau khi dng li?u thu?c cu?i cng. Thu?c ny c th? gy v sinh. Hy th?o lu?n v?i ??i ng? ch?m [place] c?a mnh, n?u qu v? lo l??ng v? kha? n?ng sinh s?n c?a mnh. Ti c th? nh?n th?y nh?ng tc d?ng ph? no khi dng thu?c ny? Nh?ng tc d?ng ph? qu v? c?n ph?i bo cho ??i ng? ch?m [place] cng s?m cng t?t: Cc ph?n ?ng d? ?ng -- da b? n?i ban, ng?a, n?i my ?ay, s?ng ? m?t, mi, l??i, ho?c h?ng Xu?t huy?t -- phn c mu ho?c mu ?en, mu h?c n, i ra mu ho?c ch?t mu nu gi?ng b c ph, n??c ti?u mu ?? ho?c nu s?m, cc ??m nh? mu ?? ho?c tm trn da, b?m tm ho?c ch?y mu b?t th??ng C?c mu ?ng -- ?au, s?ng ho?c nng ? chn, kh th?, ?au ng?c Nh?i mu c? tim -- ?au ho?c t?c ? ng?c, vai, cnh tay ho?c hm, bu?n i, kh th?, da l?nh ho?c ?m ??t, c?m th?y y?u ?t ho?c chong vng Suy tim -- kh th?, s?ng m?t c chn, bn chn ho?c bn tay, t?ng cn ??t ng?t, y?u ?t ho?c m?t m?i khc th??ng T?ng  huy?t p Nhi?m trng -- s?t, ?n l?nh, ho, ?au h?ng, v?t th??ng khng lnh, ?au khi ?i ti?u ho?c kh ?i ti?u, c?m gic kh ch?u chung ho?c c?m th?y khng kh?e Ph?n ?ng truy?n d?ch -- ?au ng?c, kh th? ho?c kh th?, c?m th?y y?u ?t ho?c chong vng T?n th??ng th?n -- gi?m l??ng n??c ti?u, s?ng ? m?t c chn, bn tay ho?c bn chn ?au bao t? d? d?i, khng h?t ho?c tr? nn t?i t? h?n ??t qu? -- ??t ng?t b? t ho?c y?u ? m?t, cnh tay ho?c chn, ni kh, l l?n, kh ?i l?i, m?t th?ng b?ng ho?c ph?i h?p ??ng tc, chng m?t, ?au ??u d? d?i, thay ??i th? l?c ?au ??u ??t ng?t v d? d?i, l l?n, thay ??i th? l?c, co gi?t, c th? l d?u hi?u c?a h?i ch?ng b?nh no sau c h?i ph?c (PRES) Cc tc d?ng ph? th??ng khng c?n ph?i ch?m [place] y t? (hy bo cho ??i ng? ch?m [place] n?u cc tc d?ng ph? ny ti?p di?n ho?c gy phi?n toi): ?au l?ng Thay ??i v? gic Tiu ch?y Da kh Ch?y nhi?u n??c m?t Ch?y mu cam Danh sch ny c th? khng m t? ?? h?t cc tc d?ng ph? c th? x?y ra. Xin g?i t?i bc s? c?a mnh ?? ???c c? v?n chuyn mn v? cc tc d?ng  ph?. Qu v? c th? t??ng trnh cc tc d?ng ph? cho FDA theo s? [phone]. Ti nn c?t gi? thu?c c?a mnh ? ?u? Thu?c ny th??ng ???c cho dng t?i b?nh vi?n ho?c phng m?ch. Thu?c ny khng ???c c?t gi? t?i nh. L?U : ?y l b?n tm t?t. N c th? khng bao hm t?t c? thng tin c th? c. N?u qu v? th?c m?c v? thu?c ny, xin trao ??i v?i bc s?, d??c s?, ho?c ng??i cung c?p d?ch v? y t? c?a mnh.  2024 Elsevier/Gold Standard (2022-05-24 00:00:00)

## 2024-01-07 NOTE — Congregational Nurse Program (Signed)
 Accompanied patient to appointment at [facility] with Dr. Lanny.  States she is feeling well today with no complaints.  CN will assist with transportation needs for upcoming appointments.  [name] RN, Congregational Nurse [phone]

## 2024-01-08 ENCOUNTER — Other Ambulatory Visit: Payer: Self-pay

## 2024-01-08 NOTE — Progress Notes (Signed)
 Specialty Pharmacy Refill Coordination Note  Jillian Lutz is a 73 y.o. female contacted today regarding refills of specialty medication(s) Capecitabine  (XELODA )  Spoke with [name], Congregational Nurse.  Patient requested Pickup at [facility] at [place] date: 01/09/24   Medication will be filled on 01/09/24.

## 2024-01-08 NOTE — Progress Notes (Signed)
 Prescription for Oral Supplements (Boost Plus) BID, pt demographics, Last MD office note, and nutritionist note faxed to [employer] [phone]).  Fax confirmation received.

## 2024-01-09 ENCOUNTER — Other Ambulatory Visit: Payer: Self-pay

## 2024-01-09 NOTE — Congregational Nurse Program (Signed)
 Home visit with interpreter [name]. Delivered Xarelto  and gabapentin  which were picked up from [facility] to patient.  Reviewed instructions for both and interpreter translated into her  language in writing on bottles.  Reminded her of appointment for C-T scan at [facility] on 01/21/2024 @ 11:45 am.  [name] RN, Congregational Nurse [phone]

## 2024-01-10 ENCOUNTER — Telehealth: Payer: Self-pay

## 2024-01-10 NOTE — Telephone Encounter (Signed)
 Scheduled transportation for 01/21/2024 appointment at [facility] for C-T scan and 01/28/2024 appointment with Dr. Lanny at [facility].  [name] RN, Congregational Nurse [phone]

## 2024-01-11 ENCOUNTER — Other Ambulatory Visit (HOSPITAL_COMMUNITY): Payer: Self-pay

## 2024-01-15 NOTE — Congregational Nurse Program (Signed)
 Home visit to deliver Xeloda  which was picked up from [facility] as well as 12 bottles of vanilla Ensure which were donated by another CN.  Patient will start Xeloda  2 tablets twice a day tomorrow.  Received letter from [employer] regarding her husband's SSI and Medicaid .  Will home visit with interpreter Diu tomorrow to call [employer] for clarification.[name] RN, Congregational Nurse [phone]

## 2024-01-16 ENCOUNTER — Other Ambulatory Visit: Payer: Self-pay

## 2024-01-16 NOTE — Congregational Nurse Program (Signed)
 Home visit with interpreter [name].  Reminded patient of appointment on Monday 07/07 for C-T at [facility].  Pick-up time is 11:00 am.  She showed us  letters she had received from [facility] regarding SSI being stopped due to assets which are over the limit. We called and provided current bank information which is well under the limit.  They will send her a letter once reevaluation is completed.  [name] RN, Congregational Nurse [phone]

## 2024-01-21 ENCOUNTER — Ambulatory Visit (HOSPITAL_COMMUNITY)
Admission: RE | Admit: 2024-01-21 | Discharge: 2024-01-21 | Disposition: A | Discharge status: Home / Self Care | Source: Ambulatory Visit | Attending: Hematology | Admitting: Hematology

## 2024-01-21 DIAGNOSIS — C20 Malignant neoplasm of rectum: Secondary | ICD-10-CM | POA: Diagnosis not present

## 2024-01-21 DIAGNOSIS — R935 Abnormal findings on diagnostic imaging of other abdominal regions, including retroperitoneum: Secondary | ICD-10-CM | POA: Diagnosis not present

## 2024-01-21 DIAGNOSIS — I700 Atherosclerosis of aorta: Secondary | ICD-10-CM | POA: Diagnosis not present

## 2024-01-21 MED ORDER — IOHEXOL 9 MG/ML PO SOLN
ORAL | Status: AC
  Filled 2024-01-21: qty 1000

## 2024-01-21 MED ORDER — IOHEXOL 9 MG/ML PO SOLN
500.0000 mL | ORAL | Status: AC
  Administered 2024-01-21 (×2): 500 mL via ORAL

## 2024-01-21 MED ORDER — IOHEXOL 300 MG/ML  SOLN
100.0000 mL | Freq: Once | INTRAMUSCULAR | Status: AC | PRN
  Administered 2024-01-21: 100 mL via INTRAVENOUS

## 2024-01-21 MED ORDER — HEPARIN SOD (PORK) LOCK FLUSH 100 UNIT/ML IV SOLN
500.0000 [IU] | Freq: Once | INTRAVENOUS | Status: AC
  Administered 2024-01-21: 500 [IU] via INTRAVENOUS

## 2024-01-22 ENCOUNTER — Other Ambulatory Visit (HOSPITAL_COMMUNITY): Payer: Self-pay

## 2024-01-22 NOTE — Progress Notes (Signed)
 Specialty Pharmacy Ongoing Clinical Assessment Note  I spoke with [name], RN (patient's congregational nurse). Jillian Lutz is a 73 y.o. female who is being followed by the specialty pharmacy service for RxSp Oncology   Patient's specialty medication(s) reviewed today: Capecitabine  (XELODA )   Missed doses in the last 4 weeks: 0   Patient/Caregiver did not have any additional questions or concerns.   Therapeutic benefit summary: Patient is achieving benefit   Adverse events/side effects summary: No adverse events/side effects   Patient's therapy is appropriate to: Continue    Goals Addressed             This Visit's Progress    Slow Disease Progression   On track    Patient is on track. Patient will maintain adherence. Per provider notes, restaging CT scan from 10/09/2023 showed stable rectal wall thickening, no other  evidence of metastasis.         Follow up: 6 months  Silvano LOISE Dolly Specialty Pharmacist

## 2024-01-23 DIAGNOSIS — Z961 Presence of intraocular lens: Secondary | ICD-10-CM | POA: Diagnosis not present

## 2024-01-23 DIAGNOSIS — H401113 Primary open-angle glaucoma, right eye, severe stage: Secondary | ICD-10-CM | POA: Diagnosis not present

## 2024-01-23 DIAGNOSIS — H40031 Anatomical narrow angle, right eye: Secondary | ICD-10-CM | POA: Diagnosis not present

## 2024-01-23 DIAGNOSIS — C20 Malignant neoplasm of rectum: Secondary | ICD-10-CM | POA: Diagnosis not present

## 2024-01-23 NOTE — Congregational Nurse Program (Signed)
 CN and interpreter [name] accompanied patient to appointment with Dr. Glendia Gaudy at [facility]. He stated he was pleased with exam today.  Glaucoma pressures were normal as well as vision in left eye. She needs to continue Latanoprost  eye drops once daily at [facility] and return in 6 months.  Scheduled appointment for Jan. 14,2025 at 2:30.  [name] RN, Congregational Nurse [phone]

## 2024-01-27 NOTE — Assessment & Plan Note (Signed)
 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 -Despite strong recommendation, patient firmly and repeatedly declined IV chemo and surgery. She agreed to chemoRT with Xeloda , and received the treatment 01/04/21 - 02/14/21. -she developed local cancer progression and lung mets in 11/2021 -she started Xeloda  on 02/16/2022, low dose oxaliplatin  was added on 05/11/22 (with cycle 4 of Xeloda )  -chemo held since 07/2022 due to leg pain and recurrent nausea  -restaging CT 09/07/2022 showed stable disease in rectum and lung, no new lesions.   -she had chemo break from Feb-May 2024 -restart CAPOX with dose reduction 12/18/2022, not tolerating very well overall.  Oxaliplatin  was held on August 5. She is on oxaliplatin  every 6 weeks now.  -Restaging CT scan from 10/09/2023 showed stable rectal wall thickening, no other  evidence of metastasis  - Restaging CT scan from January 21, 2024 showed diminished circumferential wall thickening of a low rectal mass, no evidence of distant metastasis.

## 2024-01-27 NOTE — Assessment & Plan Note (Signed)
-  Doppler on July 13, 2022 showed left acute DVT involving popliteal and peroneal vein, CTA chest was negative for PE. -continue Xarelto, tolerating well

## 2024-01-28 ENCOUNTER — Other Ambulatory Visit: Payer: Self-pay

## 2024-01-28 ENCOUNTER — Inpatient Hospital Stay

## 2024-01-28 ENCOUNTER — Inpatient Hospital Stay (HOSPITAL_BASED_OUTPATIENT_CLINIC_OR_DEPARTMENT_OTHER): Admitting: Hematology

## 2024-01-28 ENCOUNTER — Other Ambulatory Visit (HOSPITAL_COMMUNITY): Payer: Self-pay

## 2024-01-28 ENCOUNTER — Inpatient Hospital Stay: Attending: Physician Assistant

## 2024-01-28 VITALS — BP 126/77 | HR 73 | Temp 98.0°F | Resp 15 | Ht <= 58 in | Wt 106.3 lb

## 2024-01-28 DIAGNOSIS — Z5111 Encounter for antineoplastic chemotherapy: Secondary | ICD-10-CM | POA: Diagnosis present

## 2024-01-28 DIAGNOSIS — K6289 Other specified diseases of anus and rectum: Secondary | ICD-10-CM | POA: Diagnosis not present

## 2024-01-28 DIAGNOSIS — I82432 Acute embolism and thrombosis of left popliteal vein: Secondary | ICD-10-CM

## 2024-01-28 DIAGNOSIS — Z5189 Encounter for other specified aftercare: Secondary | ICD-10-CM | POA: Insufficient documentation

## 2024-01-28 DIAGNOSIS — Z923 Personal history of irradiation: Secondary | ICD-10-CM | POA: Diagnosis not present

## 2024-01-28 DIAGNOSIS — Z86718 Personal history of other venous thrombosis and embolism: Secondary | ICD-10-CM | POA: Insufficient documentation

## 2024-01-28 DIAGNOSIS — R202 Paresthesia of skin: Secondary | ICD-10-CM | POA: Diagnosis not present

## 2024-01-28 DIAGNOSIS — Z7901 Long term (current) use of anticoagulants: Secondary | ICD-10-CM | POA: Diagnosis not present

## 2024-01-28 DIAGNOSIS — C20 Malignant neoplasm of rectum: Secondary | ICD-10-CM

## 2024-01-28 DIAGNOSIS — C7800 Secondary malignant neoplasm of unspecified lung: Secondary | ICD-10-CM | POA: Diagnosis not present

## 2024-01-28 DIAGNOSIS — G629 Polyneuropathy, unspecified: Secondary | ICD-10-CM | POA: Diagnosis not present

## 2024-01-28 DIAGNOSIS — Z95828 Presence of other vascular implants and grafts: Secondary | ICD-10-CM

## 2024-01-28 LAB — CBC WITH DIFFERENTIAL (CANCER CENTER ONLY)
Abs Immature Granulocytes: 0.05 K/uL (ref 0.00–0.07)
Basophils Absolute: 0 K/uL (ref 0.0–0.1)
Basophils Relative: 1 %
Eosinophils Absolute: 0.1 K/uL (ref 0.0–0.5)
Eosinophils Relative: 2 %
HCT: 29.4 % — ABNORMAL LOW (ref 36.0–46.0)
Hemoglobin: 9.8 g/dL — ABNORMAL LOW (ref 12.0–15.0)
Immature Granulocytes: 1 %
Lymphocytes Relative: 22 %
Lymphs Abs: 1.1 K/uL (ref 0.7–4.0)
MCH: 28.3 pg (ref 26.0–34.0)
MCHC: 33.3 g/dL (ref 30.0–36.0)
MCV: 85 fL (ref 80.0–100.0)
Monocytes Absolute: 0.9 K/uL (ref 0.1–1.0)
Monocytes Relative: 17 %
Neutro Abs: 3 K/uL (ref 1.7–7.7)
Neutrophils Relative %: 57 %
Platelet Count: 235 K/uL (ref 150–400)
RBC: 3.46 MIL/uL — ABNORMAL LOW (ref 3.87–5.11)
RDW: 19.6 % — ABNORMAL HIGH (ref 11.5–15.5)
WBC Count: 5.3 K/uL (ref 4.0–10.5)
nRBC: 0 % (ref 0.0–0.2)

## 2024-01-28 LAB — CMP (CANCER CENTER ONLY)
ALT: 5 U/L (ref 0–44)
AST: 16 U/L (ref 15–41)
Albumin: 3.4 g/dL — ABNORMAL LOW (ref 3.5–5.0)
Alkaline Phosphatase: 52 U/L (ref 38–126)
Anion gap: 3 — ABNORMAL LOW (ref 5–15)
BUN: 18 mg/dL (ref 8–23)
CO2: 30 mmol/L (ref 22–32)
Calcium: 8.5 mg/dL — ABNORMAL LOW (ref 8.9–10.3)
Chloride: 105 mmol/L (ref 98–111)
Creatinine: 0.79 mg/dL (ref 0.44–1.00)
GFR, Estimated: >60 mL/min (ref >60)
Glucose, Bld: 87 mg/dL (ref 70–99)
Potassium: 3.4 mmol/L — ABNORMAL LOW (ref 3.5–5.1)
Sodium: 138 mmol/L (ref 135–145)
Total Bilirubin: 0.5 mg/dL (ref 0.0–1.2)
Total Protein: 6.5 g/dL (ref 6.5–8.1)

## 2024-01-28 LAB — TOTAL PROTEIN, URINE DIPSTICK: Protein, ur: NEGATIVE mg/dL

## 2024-01-28 LAB — CEA (ACCESS): CEA (CHCC): 25.31 ng/mL — ABNORMAL HIGH (ref 0.00–5.00)

## 2024-01-28 MED ORDER — SODIUM CHLORIDE 0.9 % IV SOLN: Freq: Once | INTRAVENOUS | Status: AC

## 2024-01-28 MED ORDER — TRAMADOL HCL 50 MG PO TABS
50.0000 mg | ORAL_TABLET | Freq: Two times a day (BID) | ORAL | 0 refills | Status: DC | PRN
  Filled 2024-01-28: qty 60, 30d supply, fill #0

## 2024-01-28 MED ORDER — HEPARIN SOD (PORK) LOCK FLUSH 100 UNIT/ML IV SOLN: 500.0000 [IU] | Freq: Once | INTRAVENOUS | Status: DC | PRN

## 2024-01-28 MED ORDER — GABAPENTIN 100 MG PO CAPS
100.0000 mg | ORAL_CAPSULE | Freq: Three times a day (TID) | ORAL | 2 refills | Status: DC | PRN
  Filled 2024-01-28: qty 90, 30d supply, fill #0

## 2024-01-28 MED ORDER — SODIUM CHLORIDE 0.9 % IV SOLN
7.5000 mg/kg | Freq: Once | INTRAVENOUS | Status: AC
  Administered 2024-01-28: 350 mg via INTRAVENOUS
  Filled 2024-01-28: qty 14

## 2024-01-28 MED ORDER — SODIUM CHLORIDE 0.9% FLUSH: 10.0000 mL | INTRAVENOUS | Status: DC | PRN

## 2024-01-28 MED ORDER — SODIUM CHLORIDE 0.9% FLUSH
10.0000 mL | Freq: Once | INTRAVENOUS | Status: AC
Start: 2024-01-28 — End: 2024-01-28
  Administered 2024-01-28: 10 mL

## 2024-01-28 NOTE — Progress Notes (Signed)
 Specialty Pharmacy Refill Coordination Note  Jillian Lutz is a 73 y.o. female contacted today regarding refills of specialty medication(s) Capecitabine  (XELODA )   Spoke with [name]   Patient requested [name] at [facility] at [place] date: 01/29/24   Medication will be filled on 07.15.25.

## 2024-01-28 NOTE — Patient Instructions (Signed)
 [facility] - A DEPT OF . [facility]  Discharge Instructions: Thank you for choosing [facility] to provide your oncology and hematology care.   If you have a lab appointment with the Cancer Center, please go directly to the Cancer Center and check in at the registration area.   Wear comfortable clothing and clothing appropriate for easy access to any Portacath or PICC line.   We strive to give you quality time with your provider. You may need to reschedule your appointment if you arrive late (15 or more minutes).  Arriving late affects you and other patients whose appointments are after yours.  Also, if you miss three or more appointments without notifying the office, you may be dismissed from the clinic at the provider's discretion.      For prescription refill requests, have your pharmacy contact our office and allow 72 hours for refills to be completed.    Today you received the following chemotherapy and/or immunotherapy agent: Bevacizumab     To help prevent nausea and vomiting after your treatment, we encourage you to take your nausea medication as directed.  BELOW ARE SYMPTOMS THAT SHOULD BE REPORTED IMMEDIATELY: *FEVER GREATER THAN 100.4 F (38 C) OR HIGHER *CHILLS OR SWEATING *NAUSEA AND VOMITING THAT IS NOT CONTROLLED WITH YOUR NAUSEA MEDICATION *UNUSUAL SHORTNESS OF BREATH *UNUSUAL BRUISING OR BLEEDING *URINARY PROBLEMS (pain or burning when urinating, or frequent urination) *BOWEL PROBLEMS (unusual diarrhea, constipation, pain near the anus) TENDERNESS IN MOUTH AND THROAT WITH OR WITHOUT PRESENCE OF ULCERS (sore throat, sores in mouth, or a toothache) UNUSUAL RASH, SWELLING OR PAIN  UNUSUAL VAGINAL DISCHARGE OR ITCHING   Items with * indicate a potential emergency and should be followed up as soon as possible or go to the Emergency Department if any problems should occur.  Please show the CHEMOTHERAPY ALERT CARD or IMMUNOTHERAPY  ALERT CARD at check-in to the Emergency Department and triage nurse.  Should you have questions after your visit or need to cancel or reschedule your appointment, please contact [facility] - A DEPT OF [facility]  Dept: [phone]  and follow the prompts.  Office hours are 8:00 a.m. to 4:30 p.m. Monday - Friday. Please note that voicemails left after 4:00 p.m. may not be returned until the following business day.  We are closed weekends and major holidays. You have access to a nurse at all times for urgent questions. Please call the main number to the clinic Dept: [phone] and follow the prompts.   For any non-urgent questions, you may also contact your provider using MyChart. We now offer e-Visits for anyone 52 and older to request care online for non-urgent symptoms. For details visit [email or web address].   Also download the MyChart app! Go to the app store, search MyChart, open the app, select [facility], and log in with your MyChart username and password. Bevacizumab  Injection What is this medication? BEVACIZUMAB  (be va SIZ yoo mab) treats some types of cancer. It works by blocking a protein that causes cancer cells to grow and multiply. This helps to slow or stop the spread of cancer cells. It is a monoclonal antibody. This medicine may be used for other purposes; ask your health care provider or pharmacist if you have questions. COMMON BRAND NAME(S): Alymsys , Avastin , MVASI , Vegzalma, Zirabev  What should I tell my care team before I take this medication? They need to know if you have any of  these conditions: Blood clots Coughing up blood Having or recent surgery Heart failure High blood pressure History of a connection between 2 or more body parts that do not usually connect (fistula) History of a tear in your stomach or intestines Protein in your urine An unusual or allergic reaction to bevacizumab , other medications, foods, dyes, or  preservatives Pregnant or trying to get pregnant Breast-feeding How should I use this medication? This medication is injected into a vein. It is given by your care team in a hospital or clinic setting. Talk to your care team the use of this medication in children. Special care may be needed. Overdosage: If you think you have taken too much of this medicine contact a poison control center or emergency room at once. NOTE: This medicine is only for you. Do not share this medicine with others. What if I miss a dose? Keep appointments for follow-up doses. It is important not to miss your dose. Call your care team if you are unable to keep an appointment. What may interact with this medication? Interactions are not expected. This list may not describe all possible interactions. Give your health care provider a list of all the medicines, herbs, non-prescription drugs, or dietary supplements you use. Also tell them if you smoke, drink alcohol, or use illegal drugs. Some items may interact with your medicine. What should I watch for while using this medication? Your condition will be monitored carefully while you are receiving this medication. You may need blood work while taking this medication. This medication may make you feel generally unwell. This is not uncommon as chemotherapy can affect healthy cells as well as cancer cells. Report any side effects. Continue your course of treatment even though you feel ill unless your care team tells you to stop. This medication may increase your risk to bruise or bleed. Call your care team if you notice any unusual bleeding. Before having surgery, talk to your care team to make sure it is ok. This medication can increase the risk of poor healing of your surgical site or wound. You will need to stop this medication for 28 days before surgery. After surgery, wait at least 28 days before restarting this medication. Make sure the surgical site or wound is healed enough  before restarting this medication. Talk to your care team if questions. Talk to your care team if you may be pregnant. Serious birth defects can occur if you take this medication during pregnancy and for 6 months after the last dose. Contraception is recommended while taking this medication and for 6 months after the last dose. Your care team can help you find the option that works for you. Do not breastfeed while taking this medication and for 6 months after the last dose. This medication can cause infertility. Talk to your care team if you are concerned about your fertility. What side effects may I notice from receiving this medication? Side effects that you should report to your care team as soon as possible: Allergic reactions--skin rash, itching, hives, swelling of the face, lips, tongue, or throat Bleeding--bloody or black, tar-like stools, vomiting blood or brown material that looks like coffee grounds, red or dark brown urine, small red or purple spots on skin, unusual bruising or bleeding Blood clot--pain, swelling, or warmth in the leg, shortness of breath, chest pain Heart attack--pain or tightness in the chest, shoulders, arms, or jaw, nausea, shortness of breath, cold or clammy skin, feeling faint or lightheaded Heart failure--shortness of breath, swelling  of the ankles, feet, or hands, sudden weight gain, unusual weakness or fatigue Increase in blood pressure Infection--fever, chills, cough, sore throat, wounds that don't heal, pain or trouble when passing urine, general feeling of discomfort or being unwell Infusion reactions--chest pain, shortness of breath or trouble breathing, feeling faint or lightheaded Kidney injury--decrease in the amount of urine, swelling of the ankles, hands, or feet Stomach pain that is severe, does not go away, or gets worse Stroke--sudden numbness or weakness of the face, arm, or leg, trouble speaking, confusion, trouble walking, loss of balance or  coordination, dizziness, severe headache, change in vision Sudden and severe headache, confusion, change in vision, seizures, which may be signs of posterior reversible encephalopathy syndrome (PRES) Side effects that usually do not require medical attention (report to your care team if they continue or are bothersome): Back pain Change in taste Diarrhea Dry skin Increased tears Nosebleed This list may not describe all possible side effects. Call your doctor for medical advice about side effects. You may report side effects to FDA at 1-800-FDA-1088. Where should I keep my medication? This medication is given in a hospital or clinic. It will not be stored at home. NOTE: This sheet is a summary. It may not cover all possible information. If you have questions about this medicine, talk to your doctor, pharmacist, or health care provider.  2024 Elsevier/Gold Standard (2021-11-18 00:00:00)

## 2024-01-28 NOTE — Congregational Nurse Program (Signed)
 Accompanied patient to appointment with Dr. Lanny who reported that patient's last C-T scan shows cancer remaining stable.  Current treatment plan will remain and she will return in 3 weeks for next IV chemo and continue Xeloda .  [name] RN, Congregational Nurse [phone]

## 2024-01-28 NOTE — Progress Notes (Signed)
 Nutrition  RD scheduled to see patient during infusion.    RD arrived to infusion suite at scheduled time but patient had already been discharged.  Short infusion today.  Rescheduled for 8/4 during infusion  Jillian Lutz B. Dasie SOLON, CSO, LDN Registered Dietitian [phone]

## 2024-01-28 NOTE — Progress Notes (Signed)
 [name]   Telephone:([phone]) [phone] Fax:(336) [phone]   Clinic Follow up Note   Patient Care Team: [name], MD as PCP - General (Hematology) [name], NP as Nurse Practitioner (Nurse Practitioner) [name], MD as Consulting Physician (Hematology and Oncology) [name], MD as Consulting Physician (Radiation Oncology) [name], [name], MD as Consulting Physician (Gastroenterology) [name], DO as Consulting Physician (Pulmonary Disease) [name], MD as Consulting Physician (General Surgery)  Date of Service:  01/28/2024  CHIEF COMPLAINT: f/u of rectal cancer  CURRENT THERAPY:  CapeOx and bevacizumab  every 3 weeks, oxaliplatin  given every other cycle  Oncology History   Left leg DVT (HCC) -Doppler on July 13, 2022 showed left acute DVT involving popliteal and peroneal vein, CTA chest was negative for PE. -continue Xarelto , tolerating well    Rectal adenocarcinoma (HCC) cT3cN1M0 stage IIIb, biopsy confirmed residual primary tumor and oligo lung met in 09/2021 -Initially diagnosed 11/2020 by colonoscopy for progressive rectal pain and bleeding, weight loss, and constipation. -Despite strong recommendation, patient firmly and repeatedly declined IV chemo and surgery. She agreed to chemoRT with Xeloda , and received the treatment 01/04/21 - 02/14/21. -she developed local cancer progression and lung mets in 11/2021 -she started Xeloda  on 02/16/2022, low dose oxaliplatin  was added on 05/11/22 (with cycle 4 of Xeloda )  -chemo held since 07/2022 due to leg pain and recurrent nausea  -restaging CT 09/07/2022 showed stable disease in rectum and lung, no new lesions.   -she had chemo break from Feb-May 2024 -restart CAPOX with dose reduction 12/18/2022, not tolerating very well overall.  Oxaliplatin  was held on August 5. She is on oxaliplatin  every 6 weeks now.  -Restaging CT scan from 10/09/2023 showed stable rectal wall thickening, no other  evidence  of metastasis  - Restaging CT scan from January 21, 2024 showed diminished circumferential wall thickening of a low rectal mass, no evidence of distant metastasis.  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 Current CT scan shows rectal wall thickening but overall improvement, and no concerning findings in the lung, lymph nodes, or liver. The lung lesion previously identified is no longer visible after chemotherapy, indicating possible resolution, but microscopic metastasis cannot be ruled out. The disease is slow-growing compared to typical rectal cancer. She has declined surgical intervention due to caregiving responsibilities for her husband. - Continue oral chemotherapy with Xeloda , two tablets in the morning and two in the evening, two weeks on and one week off. - Administer intravenous chemotherapy every six weeks. - Schedule next infusion on August 4th and subsequent infusion on August 25th. - Discuss potential for surgery or lung radiation as curative attempts if she becomes open to these options.  Peripheral neuropathy Intermittent stiffness in fingers and toes, with occasional tingling. Symptoms are not constant and there are no issues with the feet.  Plan - I personally reviewed her restaging CT scan, and compared to previous CT and PET, she has persistent stable left lower lobe lung mass, primary rectal mass has slightly improved on CT, no other new lesions. - Will continue current therapy - Lab reviewed, adequate for treatment, will proceed with bevacizumab  today, will continue Xeloda  at home - She will return in 3 weeks before next cycle CapeOx and bevacizumab    SUMMARY OF ONCOLOGIC HISTORY: Oncology History Overview Note  Cancer Staging Rectal adenocarcinoma [facility]) Staging form: Colon  and Rectum, AJCC 8th Edition - Clinical stage from 12/21/2020: Stage  IIIB (cT3, cN1, cM0) - Unsigned    Rectal adenocarcinoma (HCC)  08/26/2020 Miscellaneous   Initial presentation to PCP, reporting intermittent BRBPR since 02/2020    12/01/2020 Procedure   Colonoscopy by Dr. Shila findings - The perianal and digital rectal examinations were normal. - A 15 mm polyp was found in the ascending colon. The polyp was semi-pedunculated. Resection and retrieval were complete. - A 25 mm polyp was found in the sigmoid colon. The polyp was pedunculated. Resection and retrieval were complete. - An infiltrative partially obstructing large mass was found in the proximal rectum. The mass was partially circumferential (involving one-half of the lumen circumference). The mass measured eight cm in length extending from 10 to 18cm from anal verge. This was biopsied with a cold forceps for histology. Proximal and distal opposite fold area of the mass lesion was tattooed with an injection of total 3 mL of Spot (carbon black).   12/01/2020 Initial Biopsy   Diagnosis 1. Colon, polyp(s), ascending x 1 - ADENOCARCINOMA ARISING IN A TUBULAR ADENOMA WITH HIGH-GRADE DYSPLASIA. SEE NOTE 2. Colon, sigmoid polyp, x1 - TUBULOVILLOUS ADENOMA(S) - NEGATIVE FOR HIGH-GRADE DYSPLASIA OR MALIGNANCY 3. Rectum, biopsy - ADENOCARCINOMA. SEE NOTE   12/01/2020 Cancer Staging   Cancer Staging Rectal adenocarcinoma [facility]) Staging form: Colon and Rectum, AJCC 8th Edition - Clinical stage from 12/21/2020: Stage IIIB (cT3, cN1, cM0) - Unsigned    12/06/2020 Imaging   CT CAP with contrast IMPRESSION: 1. There is partially circumferential soft tissue thickening of the mid to superior rectum, approximately 5 cm in length and the inferior extent approximately 6 cm above the anal verge, consistent with primary rectal malignancy identified by colonoscopy. 2. There appear to be abnormally enlarged perirectal lymph nodes posteriorly about the superior rectum measuring up to 1.0 x 0.8 cm. Findings are  suspicious for perirectal nodal metastatic disease, however rectal MRI is the test of choice for initial local staging of rectal cancer. 3. There is a 4 mm nonspecific pulmonary nodule of the superior segment left lower lobe, statistically most likely incidental, infectious or inflammatory, although nonspecific and isolated metastatic disease is not strictly excluded. Attention on follow-up. 4. No other evidence of metastatic disease in the chest, abdomen, or pelvis. Aortic Atherosclerosis (ICD10-I70.0).   12/09/2020 Imaging   Local staging MRI pelvis without contrast IMPRESSION: 4.9 cm circumferential mid/lower rectal mass, corresponding to the patient's newly diagnosed rectal cancer. Rectal adenocarcinoma T stage: T3c Rectal adenocarcinoma N stage:  N1 Distance from tumor to the internal anal sphincter is 3.7 cm.   12/21/2020 Initial Diagnosis   Rectal adenocarcinoma (HCC)   01/04/2021 -  Chemotherapy   Concurrent chemoradiation with Xeloda  1000mg  in the AM and 1500mg  in the PM on days of Radiation starting 01/04/21.       01/04/2021 - 02/11/2021 Radiation Therapy   Concurrent chemoradiation with Dr Dewey and Xeloda  starting 01/04/21.    01/31/2022 Procedure   Colonoscopy, Dr. Shila  Findings: - An infiltrative partially obstructing large mass was found in the rectum. The mass was circumferential. The mass measured ten cm in length, extending from 5-15cm from anal verge. In addition, rectal diameter at narrowest approx twelve mm. Oozing was present. Biopsies were taken with a cold forceps for histology.  Impression: - Hemorrhoids found on perianal exam. - Likely malignant partially obstructing tumor in the rectum. Biopsied. - Non-bleeding external and internal hemorrhoids.   01/31/2022 Pathology Results   Diagnosis  Rectum, biopsy INVASIVE MODERATELY DIFFERENTIATED ADENOCARCINOMA   05/11/2022 -  Chemotherapy   Patient is on Treatment Plan : COLORECTAL Xelox (Capeox)(130/850)  q21d     09/07/2022 Imaging    IMPRESSION: 1. No significant change in circumferential wall thickening of the rectum, in keeping with known primary rectal mass. Similar appearance of post treatment perirectal and presacral fat stranding and fascial thickening 2. Unchanged nodule of the superior segment left lower lobe with adjacent bandlike scarring. No new nodules. 3. No evidence of lymphadenopathy or other metastatic disease in the chest, abdomen, or pelvis. 4. Large burden of stool throughout the colon. 5. Cardiomegaly.   12/08/2022 Imaging    IMPRESSION: Persistent wall thickening along the rectum with adjacent severe inflammatory stranding and nodularity. Please correlate with exact location of the distribution of the patient's neoplasm.   No discrete new mass lesion, fluid collection or lymph node enlargement.   Improved visualization of the fractures involving the left side of the pubic symphysis as well as probable insufficiency fractures along the sacrum. Associated areas of increasing heterogeneous bony sclerosis along the pubic bones and sacrum. In principle sclerotic bone metastases would be in the differential but are felt to be less likely based on the overall appearance. If needed confirmatory MRI can be considered as clinically appropriate to further delineate.   Stable scarring and fibrotic changes along the lungs with a tiny nodular area along the superior segment of the left lower lobe. Simple continued follow up.   Enlarged heart.   10/09/2023 Imaging   CT chest abdomen and pelvis with contrast  MPRESSION: 1. Persistent circumferential wall thickening in the rectum, not substantially changed taking into account less rectal distention on the current study. 2. Marked stool volume throughout the colon proximal to the rectum, similar to minimally progressive in the interval. While this may reflect clinical constipation, a degree of stricture related to the  rectal neoplasm cannot be excluded. 3. No findings on the current study to suggest progressive or metastatic disease. 4. Heterogeneous mineralization of the sacrum, as before. Nonspecific by CT, features may reflect old trauma.      Discussed the use of AI scribe software for clinical note transcription with the patient, who gave verbal consent to proceed.  History of Present Illness Jillian Lutz is a 73 year old female with rectal cancer who presents for follow-up. She is accompanied by [name], her caregiver.  Her weight is stable at 106 pounds, an improvement from a previous low of below 100 pounds. She is taking a nutritional booster supplement twice a day.  Her rectal cancer was initially diagnosed as stage 3B in May 2022, with a lung metastasis noted in March 2023, now considered stage 4. She is on oral chemotherapy with Xeloda , taken as two tablets in the morning and two in the evening for two weeks on and one week off. She also receives intravenous chemotherapy every six weeks. Current medications include gabapentin , mirtazapine , Xarelto , and tramadol .  She experiences occasional stiffness in her fingers and toes, but tingling is not constant. There are no problems with her feet and no recent bleeding episodes. She is hesitant to undergo surgery due to her responsibility of taking care of her husband.     All other systems were reviewed with the patient and are negative.  MEDICAL HISTORY:  Past Medical History:  Diagnosis Date   Bilateral wrist pain 10/25/2017   Hypertension    Left leg DVT (deep venous thrombosis) (HCC) 06/2022   Neck mass  1998   Unknown biopsy results. Excised in [place].   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   Rectal cancer (HCC)    Trigger finger, acquired 02/08/2017    SURGICAL HISTORY: Past Surgical History:  Procedure Laterality Date   BRONCHIAL BIOPSY  12/13/2021   Procedure: BRONCHIAL BIOPSIES;  Surgeon: [name], DO;  Location: [facility];  Service: Pulmonary;;   BRONCHIAL NEEDLE ASPIRATION BIOPSY  12/13/2021   Procedure: BRONCHIAL NEEDLE ASPIRATION BIOPSIES;  Surgeon: [name], DO;  Location: [facility];  Service: Pulmonary;;   IR IMAGING GUIDED PORT INSERTION  09/17/2023   NECK SURGERY Left 1998   Excision of mass in [place]   VIDEO BRONCHOSCOPY WITH RADIAL ENDOBRONCHIAL ULTRASOUND  12/13/2021   Procedure: RADIAL ENDOBRONCHIAL ULTRASOUND;  Surgeon: [name], DO;  Location: [facility]OS[facility];  Service: Pulmonary;;    I have reviewed the social history and family history with the patient and they are unchanged from previous note.  ALLERGIES:  is allergic to no known allergies.  MEDICATIONS:  Current Outpatient Medications  Medication Sig Dispense Refill   amLODipine  (NORVASC ) 5 MG tablet Take 1 tablet (5 mg total) by mouth daily. 90 tablet 3   brimonidine  (ALPHAGAN ) 0.2 % ophthalmic solution Place 1 drop into the right eye 2 (two) times daily. (Patient not taking: Reported on 01/28/2024) 10 mL 11   capecitabine  (XELODA ) 500 MG tablet Take 2 tabs every 12 hours, for 14 days then off for 7 days. Take 30mins after meal 56 tablet 2   dexamethasone  (DECADRON ) 4 MG tablet Take 1 tablet (4 mg total) by mouth daily. TAKE DAILY FOR 3-5 DAYS AFTER IV CHEMO 20 tablet 1   diclofenac  Sodium (VOLTAREN ) 1 % GEL Apply 4 grams topically 4 (four) times daily. 100 g 3   dorzolamide -timolol  (COSOPT ) 2-0.5 % ophthalmic solution Instill 1 drop into both eyes twice a day 10 mL 11   gabapentin  (NEURONTIN ) 100 MG capsule Take 1 capsule (100 mg total) by mouth 3 (three) times daily as needed. 90 capsule 2   lidocaine  (XYLOCAINE ) 5 % ointment Apply to anal area daily as needed. 50 g 1   lidocaine -prilocaine  (EMLA ) cream Apply topically daily as needed. 30 g 3   lisinopril -hydrochlorothiazide  (ZESTORETIC ) 20-12.5 MG tablet Take 2 tablets by mouth daily. 60 tablet 3   mirtazapine  (REMERON ) 30 MG tablet  Take 1 tablet (30 mg total) by mouth at bedtime. 30 tablet 3   Multiple Vitamins-Minerals (MULTIVITAMIN WITH MINERALS) tablet Take 1 tablet by mouth daily. Gummy 50 mg     ondansetron  (ZOFRAN ) 8 MG tablet Take 1 tablet (8 mg total) by mouth every 8 (eight) hours as needed for nausea or vomiting. Take after 3 days of chemo 30 tablet 2   polyethylene glycol powder (GLYCOLAX/MIRALAX) 17 GM/SCOOP powder Take 1 Container by mouth once.     potassium chloride  (KLOR-CON  M) 10 MEQ tablet Take 1 tablet (10 mEq total) by mouth 2 (two) times daily. (Patient taking differently: Take 10 mEq by mouth daily.) 60 tablet 2   prochlorperazine  (COMPAZINE ) 10 MG tablet Take 1 tablet (10 mg total) by mouth every 6 (six) hours as needed. 30 tablet 2   rivaroxaban  (XARELTO ) 20 MG TABS tablet Take 1 tablet (20 mg total) by mouth daily with supper. 30 tablet 2   traMADol  (ULTRAM ) 50 MG tablet Take 1 tablet (50 mg total) by mouth every 12 (twelve) hours as needed. 60 tablet 0   urea  (  CARMOL) 10 % cream Apply topically 2 times daily as needed. 85 g 3   No current facility-administered medications for this visit.   Facility-Administered Medications Ordered in Other Visits  Medication Dose Route Frequency Provider Last Rate Last Admin   heparin  lock flush 100 unit/mL  500 Units Intracatheter Once PRN [name], MD       sodium chloride  flush (NS) 0.9 % injection 10 mL  10 mL Intracatheter PRN [name], MD        PHYSICAL EXAMINATION: ECOG PERFORMANCE STATUS: 1 - Symptomatic but completely ambulatory  Vitals:   01/28/24 1132  BP: 126/77  Pulse: 73  Resp: 15  Temp: 98 F (36.7 C)  SpO2: 100%   Wt Readings from Last 3 Encounters:  01/28/24 48.2 kg (106 lb 4.8 oz)  01/07/24 48.3 kg (106 lb 8 oz)  12/24/23 47.3 kg (104 lb 3.2 oz)     GENERAL:alert, no distress and comfortable SKIN: skin color, texture, turgor are normal, no rashes or significant lesions EYES: normal, Conjunctiva are pink and non-injected,  sclera clear NECK: supple, thyroid normal size, non-tender, without nodularity LYMPH:  no palpable lymphadenopathy in the cervical, axillary  LUNGS: clear to auscultation and percussion with normal breathing effort HEART: regular rate & rhythm and no murmurs and no lower extremity edema ABDOMEN:abdomen soft, non-tender and normal bowel sounds Musculoskeletal:no cyanosis of digits and no clubbing  NEURO: alert & oriented x 3 with fluent speech, no focal motor/sensory deficits  Physical Exam MEASUREMENTS: Weight- 106.  LABORATORY DATA:  I have reviewed the data as listed    Latest Ref Rng & Units 01/28/2024   11:15 AM 01/07/2024   11:20 AM 12/17/2023   12:13 PM  CBC  WBC 4.0 - 10.5 K/uL 5.3  4.2  4.3   Hemoglobin 12.0 - 15.0 g/dL 9.8  89.9  9.3   Hematocrit 36.0 - 46.0 % 29.4  29.9  27.8   Platelets 150 - 400 K/uL 235  202  209         Latest Ref Rng & Units 01/28/2024   11:15 AM 01/07/2024   11:20 AM 12/17/2023   12:13 PM  CMP  Glucose 70 - 99 mg/dL 87  890  880   BUN 8 - 23 mg/dL 18  16  15    Creatinine 0.44 - 1.00 mg/dL 9.20  9.19  9.08   Sodium 135 - 145 mmol/L 138  141  141   Potassium 3.5 - 5.1 mmol/L 3.4  3.3  3.2   Chloride 98 - 111 mmol/L 105  107  107   CO2 22 - 32 mmol/L 30  29  31    Calcium 8.9 - 10.3 mg/dL 8.5  8.6  8.3   Total Protein 6.5 - 8.1 g/dL 6.5  6.7  6.1   Total Bilirubin 0.0 - 1.2 mg/dL 0.5  0.3  0.3   Alkaline Phos 38 - 126 U/L 52  52  38   AST 15 - 41 U/L 16  12  13    ALT 0 - 44 U/L 5  5  <5       RADIOGRAPHIC STUDIES: I have personally reviewed the radiological images as listed and agreed with the findings in the report. No results found.    No orders of the defined types were placed in this encounter.  All questions were answered. The patient knows to call the clinic with any problems, questions or concerns. No barriers to learning was detected. The total time  spent in the appointment was 30 minutes, including review of chart and various  tests results, discussions about plan of care and coordination of care plan     Onita Mattock, MD 01/28/2024

## 2024-02-01 ENCOUNTER — Telehealth: Payer: Self-pay

## 2024-02-01 NOTE — Telephone Encounter (Signed)
 Scheduled transportation for 02/14/2024 appointment with [facility] and 08/04 appointment with [facility].  [name] RN, Congregational Nurse [phone]

## 2024-02-02 ENCOUNTER — Other Ambulatory Visit: Payer: Self-pay

## 2024-02-04 ENCOUNTER — Other Ambulatory Visit (HOSPITAL_COMMUNITY): Payer: Self-pay

## 2024-02-05 ENCOUNTER — Other Ambulatory Visit (HOSPITAL_COMMUNITY): Payer: Self-pay

## 2024-02-06 ENCOUNTER — Telehealth: Payer: Self-pay

## 2024-02-06 ENCOUNTER — Other Ambulatory Visit: Payer: Self-pay

## 2024-02-06 NOTE — Telephone Encounter (Signed)
 Called patient to remind her to start Xeloda  today and that she has PCP appointment on 02/14/2024 @ 10:45 with Dr. Marylu.  Transportation has been requested and she needs to be ready at 10:00 am.  [name] RN, Congregational Nurse [phone]

## 2024-02-09 ENCOUNTER — Other Ambulatory Visit: Payer: Self-pay

## 2024-02-11 ENCOUNTER — Other Ambulatory Visit: Payer: Self-pay

## 2024-02-12 ENCOUNTER — Other Ambulatory Visit: Payer: Self-pay

## 2024-02-14 ENCOUNTER — Ambulatory Visit (INDEPENDENT_AMBULATORY_CARE_PROVIDER_SITE_OTHER): Admitting: Student

## 2024-02-14 VITALS — BP 126/74 | HR 75 | Temp 98.0°F | Ht <= 58 in | Wt 105.8 lb

## 2024-02-14 DIAGNOSIS — C20 Malignant neoplasm of rectum: Secondary | ICD-10-CM | POA: Diagnosis not present

## 2024-02-14 DIAGNOSIS — R7303 Prediabetes: Secondary | ICD-10-CM | POA: Diagnosis present

## 2024-02-14 DIAGNOSIS — E876 Hypokalemia: Secondary | ICD-10-CM

## 2024-02-14 DIAGNOSIS — I10 Essential (primary) hypertension: Secondary | ICD-10-CM | POA: Diagnosis not present

## 2024-02-14 LAB — POCT GLYCOSYLATED HEMOGLOBIN (HGB A1C): Hemoglobin A1C: 5.7 % — AB (ref 4.0–5.6)

## 2024-02-14 LAB — GLUCOSE, CAPILLARY: Glucose-Capillary: 122 mg/dL — ABNORMAL HIGH (ref 70–99)

## 2024-02-14 NOTE — Progress Notes (Signed)
 CC: Follow-up  HPI:  [name] is a 73 y.o. female living with a history stated below and presents today for follow-up. Please see problem based assessment and plan for additional details.  Past Medical History:  Diagnosis Date   Bilateral wrist pain 10/25/2017   Hypertension    Left leg DVT (deep venous thrombosis) (HCC) 06/2022   Neck mass 1998   Unknown biopsy results. Excised in [place].   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   Rectal cancer (HCC)    Trigger finger, acquired 02/08/2017    Current Outpatient Medications on File Prior to Visit  Medication Sig Dispense Refill   brimonidine  (ALPHAGAN ) 0.2 % ophthalmic solution Place 1 drop into the right eye 2 (two) times daily. (Patient not taking: Reported on 01/28/2024) 10 mL 11   capecitabine  (XELODA ) 500 MG tablet Take 2 tabs every 12 hours, for 14 days then off for 7 days. Take 30mins after meal 56 tablet 2   dexamethasone  (DECADRON ) 4 MG tablet Take 1 tablet (4 mg total) by mouth daily. TAKE DAILY FOR 3-5 DAYS AFTER IV CHEMO 20 tablet 1   diclofenac  Sodium (VOLTAREN ) 1 % GEL Apply 4 grams topically 4 (four) times daily. 100 g 3   dorzolamide -timolol  (COSOPT ) 2-0.5 % ophthalmic solution Instill 1 drop into both eyes twice a day 10 mL 11   gabapentin  (NEURONTIN ) 100 MG capsule Take 1 capsule (100 mg total) by mouth 3 (three) times daily as needed. 90 capsule 2   lidocaine  (XYLOCAINE ) 5 % ointment Apply to anal area daily as needed. 50 g 1   lidocaine -prilocaine  (EMLA ) cream Apply topically daily as needed. 30 g 3   lisinopril -hydrochlorothiazide  (ZESTORETIC ) 20-12.5 MG tablet Take 2 tablets by mouth daily. 60 tablet 3   mirtazapine  (REMERON ) 30 MG tablet Take 1 tablet (30 mg total) by mouth at bedtime. 30 tablet 3   Multiple Vitamins-Minerals (MULTIVITAMIN WITH MINERALS) tablet Take 1 tablet by mouth daily. Gummy 50 mg     ondansetron  (ZOFRAN ) 8 MG tablet Take 1 tablet (8 mg total) by  mouth every 8 (eight) hours as needed for nausea or vomiting. Take after 3 days of chemo 30 tablet 2   polyethylene glycol powder (GLYCOLAX/MIRALAX) 17 GM/SCOOP powder Take 1 Container by mouth once.     prochlorperazine  (COMPAZINE ) 10 MG tablet Take 1 tablet (10 mg total) by mouth every 6 (six) hours as needed. 30 tablet 2   rivaroxaban  (XARELTO ) 20 MG TABS tablet Take 1 tablet (20 mg total) by mouth daily with supper. 30 tablet 2   traMADol  (ULTRAM ) 50 MG tablet Take 1 tablet (50 mg total) by mouth every 12 (twelve) hours as needed. 60 tablet 0   urea  (CARMOL) 10 % cream Apply topically 2 times daily as needed. 85 g 3   No current facility-administered medications on file prior to visit.    Family History  Problem Relation Age of Onset   Headache Son    Colon cancer Neg Hx    Stomach cancer Neg Hx    Esophageal cancer Neg Hx     Social History   Socioeconomic History   Marital status: Married    Spouse name: Designer, jewellery   Number of children: Not on file   Years of education: Not on file   Highest education level: Not on file  Occupational History   Not on file  Tobacco Use   Smoking status: Never   Smokeless tobacco: Never  Vaping Use   Vaping status: Never Used  Substance and Sexual Activity   Alcohol use: No   Drug use: No   Sexual activity: Not on file  Other Topics Concern   Not on file  Social History Narrative   Came to [place]  from [place] in 2013.   Does not know much family history [place] care.   Social Drivers of Corporate investment banker Strain: Not on file  Food Insecurity: No Food Insecurity (01/28/2024)   Hunger Vital Sign    Worried About Running Out of Food in the Last Year: Never true    Ran Out of Food in the Last Year: Never true  Transportation Needs: No Transportation Needs (01/28/2024)   PRAPARE - Administrator, Civil Service (Medical): No    Lack of Transportation  (Non-Medical): No  Physical Activity: Not on file  Stress: Not on file  Social Connections: Socially Integrated (01/28/2024)   Social Connection and Isolation Panel    Frequency of Communication with Friends and Family: More than three times a week    Frequency of Social Gatherings with Friends and Family: More than three times a week    Attends Religious Services: More than 4 times per year    Active Member of [employer] or Organizations: Yes    Attends Engineer, structural: More than 4 times per year    Marital Status: Married  Catering manager Violence: Not At Risk (01/28/2024)   Humiliation, Afraid, Rape, and Kick questionnaire    Fear of Current or Ex-Partner: No    Emotionally Abused: No    Physically Abused: No    Sexually Abused: No    Review of Systems: ROS negative except for what is noted on the assessment and plan.  Vitals:   02/14/24 1046  BP: 126/74  Pulse: 75  Temp: 98 F (36.7 C)  TempSrc: Oral  Weight: 105 lb 12.8 oz (48 kg)  Height: 4' 10 (1.473 m)    Physical Exam: Constitutional: sitting in chair, in no acute distress Cardiovascular: regular rate and rhythm, no m/r/g Pulmonary/Chest: normal work of breathing on room air, lungs clear to auscultation bilaterally Abdominal: soft, non-tender, non-distended  Assessment & Plan:     Patient discussed with Dr. Francesco  Essential hypertension Normotensive. Continue Amlodipine  5 and Zestoretic  20-12.5 daily.   Rectal adenocarcinoma (HCC) Stage 4 with lung metastasis. Follow with Dr. Lanny with Oncology and last seen 7/14. Per his note: -Continue oral chemotherapy with Xeloda , two tablets in the morning and two in the evening, two weeks on and one week off. - Administer intravenous chemotherapy every six weeks. - Schedule next infusion on August 4th and subsequent infusion on August 25th. - Discuss potential for surgery or lung radiation as curative attempts if she becomes open to these  options.  Recent labs including CBC, CMP stable.  Prediabetes A1c is 5.7 down from 6.0 08/2023. Not on medications and will focus on lifestyle modifications.    Norman Lobstein, D.O. [facility], PGY-2 Phone: [phone] Date 02/16/2024 Time 11:02 AM

## 2024-02-14 NOTE — Congregational Nurse Program (Signed)
 Accompanied patient to PCP appointment for follow-up.  Only complaint was sore throat x1 day.  No medication changes today.  Dr. Marylu recommended throat lozenges which were picked up at pharmacy after visit.  Hgb A1C done and will notify patient of results.  Return in 6 months.  Advised her to call if sore throat doesn't improve.  [name] RN, Congregational Nurse [phone]

## 2024-02-15 ENCOUNTER — Other Ambulatory Visit: Payer: Self-pay

## 2024-02-15 ENCOUNTER — Other Ambulatory Visit: Payer: Self-pay | Admitting: Hematology

## 2024-02-15 NOTE — Progress Notes (Signed)
 Specialty Pharmacy Refill Coordination Note  Jillian Lutz is a 72 y.o. female contacted today regarding refills of specialty medication(s) Capecitabine  (XELODA )   Patient requested Delivery   Delivery date: 02/21/24   Verified address: [address] [place] [address]   Medication will be filled on 08.08.25.     Refill request pending, may need to call [name] if delayed

## 2024-02-16 ENCOUNTER — Other Ambulatory Visit: Payer: Self-pay

## 2024-02-16 MED ORDER — POTASSIUM CHLORIDE CRYS ER 10 MEQ PO TBCR
10.0000 meq | EXTENDED_RELEASE_TABLET | Freq: Every day | ORAL | 3 refills | Status: DC
  Filled 2024-02-16: qty 90, 90d supply, fill #0

## 2024-02-16 MED ORDER — AMLODIPINE BESYLATE 5 MG PO TABS
5.0000 mg | ORAL_TABLET | Freq: Every day | ORAL | 3 refills | Status: DC
  Filled 2024-02-16: qty 90, 90d supply, fill #0

## 2024-02-16 NOTE — Assessment & Plan Note (Addendum)
 Stage 4 with lung metastasis. Follow with Dr. Lanny with Oncology and last seen 7/14. Per his note: -Continue oral chemotherapy with Xeloda , two tablets in the morning and two in the evening, two weeks on and one week off. - Administer intravenous chemotherapy every six weeks. - Schedule next infusion on August 4th and subsequent infusion on August 25th. - Discuss potential for surgery or lung radiation as curative attempts if she becomes open to these options.  Recent labs including CBC, CMP stable.

## 2024-02-16 NOTE — Assessment & Plan Note (Signed)
 A1c is 5.7 down from 6.0 08/2023. Not on medications and will focus on lifestyle modifications.

## 2024-02-16 NOTE — Assessment & Plan Note (Signed)
 Normotensive. Continue Amlodipine  5 and Zestoretic  20-12.5 daily.

## 2024-02-17 ENCOUNTER — Other Ambulatory Visit (HOSPITAL_COMMUNITY): Payer: Self-pay

## 2024-02-18 ENCOUNTER — Inpatient Hospital Stay

## 2024-02-18 ENCOUNTER — Encounter: Payer: Self-pay | Admitting: Hematology

## 2024-02-18 ENCOUNTER — Other Ambulatory Visit (HOSPITAL_BASED_OUTPATIENT_CLINIC_OR_DEPARTMENT_OTHER): Payer: Self-pay

## 2024-02-18 ENCOUNTER — Other Ambulatory Visit (HOSPITAL_COMMUNITY): Payer: Self-pay

## 2024-02-18 ENCOUNTER — Inpatient Hospital Stay: Attending: Physician Assistant | Admitting: Hematology

## 2024-02-18 ENCOUNTER — Other Ambulatory Visit: Payer: Self-pay

## 2024-02-18 ENCOUNTER — Telehealth: Payer: Self-pay

## 2024-02-18 VITALS — BP 124/68 | HR 70 | Temp 98.7°F | Resp 15 | Ht <= 58 in | Wt 109.0 lb

## 2024-02-18 DIAGNOSIS — D6481 Anemia due to antineoplastic chemotherapy: Secondary | ICD-10-CM | POA: Diagnosis not present

## 2024-02-18 DIAGNOSIS — C20 Malignant neoplasm of rectum: Secondary | ICD-10-CM | POA: Insufficient documentation

## 2024-02-18 DIAGNOSIS — J029 Acute pharyngitis, unspecified: Secondary | ICD-10-CM | POA: Diagnosis not present

## 2024-02-18 DIAGNOSIS — I82432 Acute embolism and thrombosis of left popliteal vein: Secondary | ICD-10-CM

## 2024-02-18 DIAGNOSIS — Z923 Personal history of irradiation: Secondary | ICD-10-CM | POA: Diagnosis not present

## 2024-02-18 DIAGNOSIS — Z7901 Long term (current) use of anticoagulants: Secondary | ICD-10-CM | POA: Diagnosis not present

## 2024-02-18 DIAGNOSIS — K6289 Other specified diseases of anus and rectum: Secondary | ICD-10-CM | POA: Diagnosis not present

## 2024-02-18 DIAGNOSIS — R112 Nausea with vomiting, unspecified: Secondary | ICD-10-CM

## 2024-02-18 DIAGNOSIS — Z5111 Encounter for antineoplastic chemotherapy: Secondary | ICD-10-CM | POA: Diagnosis present

## 2024-02-18 DIAGNOSIS — E876 Hypokalemia: Secondary | ICD-10-CM | POA: Diagnosis not present

## 2024-02-18 DIAGNOSIS — Z95828 Presence of other vascular implants and grafts: Secondary | ICD-10-CM

## 2024-02-18 DIAGNOSIS — G8929 Other chronic pain: Secondary | ICD-10-CM | POA: Diagnosis not present

## 2024-02-18 DIAGNOSIS — C7800 Secondary malignant neoplasm of unspecified lung: Secondary | ICD-10-CM | POA: Insufficient documentation

## 2024-02-18 DIAGNOSIS — Z86718 Personal history of other venous thrombosis and embolism: Secondary | ICD-10-CM | POA: Insufficient documentation

## 2024-02-18 LAB — CMP (CANCER CENTER ONLY)
ALT: <5 U/L (ref 0–44)
AST: 14 U/L — ABNORMAL LOW (ref 15–41)
Albumin: 3.5 g/dL (ref 3.5–5.0)
Alkaline Phosphatase: 53 U/L (ref 38–126)
Anion gap: 4 — ABNORMAL LOW (ref 5–15)
BUN: 16 mg/dL (ref 8–23)
CO2: 32 mmol/L (ref 22–32)
Calcium: 8.6 mg/dL — ABNORMAL LOW (ref 8.9–10.3)
Chloride: 104 mmol/L (ref 98–111)
Creatinine: 0.8 mg/dL (ref 0.44–1.00)
GFR, Estimated: >60 mL/min (ref >60)
Glucose, Bld: 109 mg/dL — ABNORMAL HIGH (ref 70–99)
Potassium: 3.4 mmol/L — ABNORMAL LOW (ref 3.5–5.1)
Sodium: 140 mmol/L (ref 135–145)
Total Bilirubin: 0.4 mg/dL (ref 0.0–1.2)
Total Protein: 6.9 g/dL (ref 6.5–8.1)

## 2024-02-18 LAB — CBC WITH DIFFERENTIAL (CANCER CENTER ONLY)
Abs Immature Granulocytes: 0.03 K/uL (ref 0.00–0.07)
Basophils Absolute: 0.1 K/uL (ref 0.0–0.1)
Basophils Relative: 1 %
Eosinophils Absolute: 0.2 K/uL (ref 0.0–0.5)
Eosinophils Relative: 4 %
HCT: 29 % — ABNORMAL LOW (ref 36.0–46.0)
Hemoglobin: 9.7 g/dL — ABNORMAL LOW (ref 12.0–15.0)
Immature Granulocytes: 1 %
Lymphocytes Relative: 16 %
Lymphs Abs: 1 K/uL (ref 0.7–4.0)
MCH: 28.4 pg (ref 26.0–34.0)
MCHC: 33.4 g/dL (ref 30.0–36.0)
MCV: 84.8 fL (ref 80.0–100.0)
Monocytes Absolute: 0.7 K/uL (ref 0.1–1.0)
Monocytes Relative: 10 %
Neutro Abs: 4.4 K/uL (ref 1.7–7.7)
Neutrophils Relative %: 68 %
Platelet Count: 231 K/uL (ref 150–400)
RBC: 3.42 MIL/uL — ABNORMAL LOW (ref 3.87–5.11)
RDW: 18.8 % — ABNORMAL HIGH (ref 11.5–15.5)
WBC Count: 6.4 K/uL (ref 4.0–10.5)
nRBC: 0 % (ref 0.0–0.2)

## 2024-02-18 LAB — CEA (ACCESS): CEA (CHCC): 29.66 ng/mL — ABNORMAL HIGH (ref 0.00–5.00)

## 2024-02-18 MED ORDER — SODIUM CHLORIDE 0.9% FLUSH
10.0000 mL | Freq: Once | INTRAVENOUS | Status: AC
Start: 2024-02-18 — End: 2024-02-18
  Administered 2024-02-18: 10 mL

## 2024-02-18 MED ORDER — TRAMADOL HCL 50 MG PO TABS
50.0000 mg | ORAL_TABLET | Freq: Two times a day (BID) | ORAL | 0 refills | Status: DC | PRN
  Filled 2024-02-18: qty 60, 30d supply, fill #0

## 2024-02-18 MED ORDER — CAPECITABINE 500 MG PO TABS
ORAL_TABLET | ORAL | 2 refills | Status: DC
  Filled 2024-02-18: qty 56, 21d supply, fill #0
  Filled 2024-03-05: qty 56, 21d supply, fill #1
  Filled 2024-03-31: qty 56, 21d supply, fill #2

## 2024-02-18 MED ORDER — PROCHLORPERAZINE MALEATE 10 MG PO TABS
10.0000 mg | ORAL_TABLET | Freq: Four times a day (QID) | ORAL | 2 refills | Status: AC | PRN
Start: 2024-02-18 — End: ?
  Filled 2024-02-18: qty 30, 8d supply, fill #0

## 2024-02-18 NOTE — Assessment & Plan Note (Signed)
-  Doppler on July 13, 2022 showed left acute DVT involving popliteal and peroneal vein, CTA chest was negative for PE. -continue Xarelto, tolerating well

## 2024-02-18 NOTE — Assessment & Plan Note (Signed)
 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 -Despite strong recommendation, patient firmly and repeatedly declined IV chemo and surgery. She agreed to chemoRT with Xeloda , and received the treatment 01/04/21 - 02/14/21. -she developed local cancer progression and lung mets in 11/2021 -she started Xeloda  on 02/16/2022, low dose oxaliplatin  was added on 05/11/22 (with cycle 4 of Xeloda )  -chemo held since 07/2022 due to leg pain and recurrent nausea  -restaging CT 09/07/2022 showed stable disease in rectum and lung, no new lesions.   -she had chemo break from Feb-May 2024 -restart CAPOX with dose reduction 12/18/2022, not tolerating very well overall.  Oxaliplatin  was held on August 5. She is on oxaliplatin  every 6 weeks now.  -Restaging CT scan from 10/09/2023 showed stable rectal wall thickening, no other  evidence of metastasis  - Restaging CT scan from January 21, 2024 showed diminished circumferential wall thickening of a low rectal mass, no evidence of distant metastasis.

## 2024-02-18 NOTE — Progress Notes (Signed)
 Internal Medicine Clinic Attending  Case discussed with the resident at the time of the visit.  We reviewed the resident's history and exam and pertinent patient test results.  I agree with the assessment, diagnosis, and plan of care documented in the resident's note.

## 2024-02-18 NOTE — Congregational Nurse Program (Signed)
 Accompanied patient to visit with Dr. Lanny.  Complaining of sore throat x 5 days. Dr. Lanny recommended Covid test and throat spray which CN obtained from [facility].  Test was negative for Covid and A and B Flu.  Today's treatment cancelled and rescheduled for 02/27/2024 @ 9:30 am.  CN will request transportation. [name] RN ,Congregational Nurse [phone]

## 2024-02-18 NOTE — Progress Notes (Signed)
 [facility]   [phone] [phone]   Clinic Follow up Note   Patient Care Team: [name], DO as PCP - General (Internal Medicine) Burton, [name] K, NP as Nurse Practitioner (Nurse Practitioner) [name], MD as Consulting Physician (Hematology and Oncology) [name], MD as Consulting Physician (Radiation Oncology) [name], [name], MD as Consulting Physician (Gastroenterology) [name], DO as Consulting Physician (Pulmonary Disease) [name], MD as Consulting Physician (General Surgery)  Date of Service:  02/18/2024  CHIEF COMPLAINT: f/u of metastatic rectal cancer  CURRENT THERAPY:  CapeOx and bevacizumab   Oncology History   Rectal adenocarcinoma (HCC) cT3cN1M0 stage IIIb, biopsy confirmed residual primary tumor and oligo lung met in 09/2021 -Initially diagnosed 11/2020 by colonoscopy for progressive rectal pain and bleeding, weight loss, and constipation. -Despite strong recommendation, patient firmly and repeatedly declined IV chemo and surgery. She agreed to chemoRT with Xeloda , and received the treatment 01/04/21 - 02/14/21. -she developed local cancer progression and lung mets in 11/2021 -she started Xeloda  on 02/16/2022, low dose oxaliplatin  was added on 05/11/22 (with cycle 4 of Xeloda )  -chemo held since 07/2022 due to leg pain and recurrent nausea  -restaging CT 09/07/2022 showed stable disease in rectum and lung, no new lesions.   -she had chemo break from Feb-May 2024 -restart CAPOX with dose reduction 12/18/2022, not tolerating very well overall.  Oxaliplatin  was held on August 5. She is on oxaliplatin  every 6 weeks now.  -Restaging CT scan from 10/09/2023 showed stable rectal wall thickening, no other  evidence of metastasis  - Restaging CT scan from January 21, 2024 showed diminished circumferential wall thickening of a low rectal mass, no evidence of distant metastasis.  Left leg DVT (HCC) -Doppler on July 13, 2022  showed left acute DVT involving popliteal and peroneal vein, CTA chest was negative for PE. -continue Xarelto , tolerating well   Assessment & Plan Metastatic rectal cancer Metastatic rectal cancer managed with Xeloda  (capecitabine ), currently in the middle of a treatment cycle, expected to finish on Wednesday. No new complications related to cancer progression. Consider postponing treatment if sore throat worsens. - Continue Xeloda  as prescribed - Postpone treatment to next week if sore throat worsens  Severe sore throat, likely viral pharyngitis Severe sore throat for 4-5 days, likely viral as there is no pus and throat appears slightly red. No bacterial infection suspected. Painful swallowing impacting oral intake. COVID-19 infection not ruled out. - Examine throat for signs of infection - Provide lidocaine  mouthwash for pain management if needed - Recommend over-the-counter throat spray for symptomatic relief - Ensure access to COVID test kit to rule out COVID-19 infection  Chemotherapy-induced anemia, stable Chemotherapy-induced anemia is stable. White blood cell count is normal, indicating no acute hematological issues.  Chronic pain requiring tramadol  Chronic pain managed with tramadol , currently taking twice daily. Approximately 20 tablets remaining, indicating a need for refill soon. - Refill tramadol  prescription - Call in prescription to [facility] - Due to her severe sore throat, will hold chemotherapy today and postponed to next week - I recommend home test for COVID and flu - She will return next week for chemotherapy.   SUMMARY OF ONCOLOGIC HISTORY: Oncology History Overview Note  Cancer Staging Rectal adenocarcinoma [facility]) Staging form: Colon and Rectum, AJCC 8th Edition - Clinical stage from 12/21/2020: Stage IIIB (cT3, cN1, cM0) - Unsigned    Rectal adenocarcinoma (HCC)  08/26/2020 Miscellaneous   Initial presentation to PCP, reporting intermittent  BRBPR  since 02/2020    12/01/2020 Procedure   Colonoscopy by Dr. Shila findings - The perianal and digital rectal examinations were normal. - A 15 mm polyp was found in the ascending colon. The polyp was semi-pedunculated. Resection and retrieval were complete. - A 25 mm polyp was found in the sigmoid colon. The polyp was pedunculated. Resection and retrieval were complete. - An infiltrative partially obstructing large mass was found in the proximal rectum. The mass was partially circumferential (involving one-half of the lumen circumference). The mass measured eight cm in length extending from 10 to 18cm from anal verge. This was biopsied with a cold forceps for histology. Proximal and distal opposite fold area of the mass lesion was tattooed with an injection of total 3 mL of Spot (carbon black).   12/01/2020 Initial Biopsy   Diagnosis 1. Colon, polyp(s), ascending x 1 - ADENOCARCINOMA ARISING IN A TUBULAR ADENOMA WITH HIGH-GRADE DYSPLASIA. SEE NOTE 2. Colon, sigmoid polyp, x1 - TUBULOVILLOUS ADENOMA(S) - NEGATIVE FOR HIGH-GRADE DYSPLASIA OR MALIGNANCY 3. Rectum, biopsy - ADENOCARCINOMA. SEE NOTE   12/01/2020 Cancer Staging   Cancer Staging Rectal adenocarcinoma [facility]) Staging form: Colon and Rectum, AJCC 8th Edition - Clinical stage from 12/21/2020: Stage IIIB (cT3, cN1, cM0) - Unsigned    12/06/2020 Imaging   CT CAP with contrast IMPRESSION: 1. There is partially circumferential soft tissue thickening of the mid to superior rectum, approximately 5 cm in length and the inferior extent approximately 6 cm above the anal verge, consistent with primary rectal malignancy identified by colonoscopy. 2. There appear to be abnormally enlarged perirectal lymph nodes posteriorly about the superior rectum measuring up to 1.0 x 0.8 cm. Findings are suspicious for perirectal nodal metastatic disease, however rectal MRI is the test of choice for initial local staging of rectal cancer. 3. There is  a 4 mm nonspecific pulmonary nodule of the superior segment left lower lobe, statistically most likely incidental, infectious or inflammatory, although nonspecific and isolated metastatic disease is not strictly excluded. Attention on follow-up. 4. No other evidence of metastatic disease in the chest, abdomen, or pelvis. Aortic Atherosclerosis (ICD10-I70.0).   12/09/2020 Imaging   Local staging MRI pelvis without contrast IMPRESSION: 4.9 cm circumferential mid/lower rectal mass, corresponding to the patient's newly diagnosed rectal cancer. Rectal adenocarcinoma T stage: T3c Rectal adenocarcinoma N stage:  N1 Distance from tumor to the internal anal sphincter is 3.7 cm.   12/21/2020 Initial Diagnosis   Rectal adenocarcinoma (HCC)   01/04/2021 -  Chemotherapy   Concurrent chemoradiation with Xeloda  1000mg  in the AM and 1500mg  in the PM on days of Radiation starting 01/04/21.       01/04/2021 - 02/11/2021 Radiation Therapy   Concurrent chemoradiation with Dr Dewey and Xeloda  starting 01/04/21.    01/31/2022 Procedure   Colonoscopy, Dr. Shila  Findings: - An infiltrative partially obstructing large mass was found in the rectum. The mass was circumferential. The mass measured ten cm in length, extending from 5-15cm from anal verge. In addition, rectal diameter at narrowest approx twelve mm. Oozing was present. Biopsies were taken with a cold forceps for histology.  Impression: - Hemorrhoids found on perianal exam. - Likely malignant partially obstructing tumor in the rectum. Biopsied. - Non-bleeding external and internal hemorrhoids.   01/31/2022 Pathology Results   Diagnosis Rectum, biopsy INVASIVE MODERATELY DIFFERENTIATED ADENOCARCINOMA   05/11/2022 -  Chemotherapy   Patient is on Treatment Plan : COLORECTAL Xelox (Capeox)(130/850) q21d     09/07/2022 Imaging    IMPRESSION: 1. No  significant change in circumferential wall thickening of the rectum, in keeping with known primary  rectal mass. Similar appearance of post treatment perirectal and presacral fat stranding and fascial thickening 2. Unchanged nodule of the superior segment left lower lobe with adjacent bandlike scarring. No new nodules. 3. No evidence of lymphadenopathy or other metastatic disease in the chest, abdomen, or pelvis. 4. Large burden of stool throughout the colon. 5. Cardiomegaly.   12/08/2022 Imaging    IMPRESSION: Persistent wall thickening along the rectum with adjacent severe inflammatory stranding and nodularity. Please correlate with exact location of the distribution of the patient's neoplasm.   No discrete new mass lesion, fluid collection or lymph node enlargement.   Improved visualization of the fractures involving the left side of the pubic symphysis as well as probable insufficiency fractures along the sacrum. Associated areas of increasing heterogeneous bony sclerosis along the pubic bones and sacrum. In principle sclerotic bone metastases would be in the differential but are felt to be less likely based on the overall appearance. If needed confirmatory MRI can be considered as clinically appropriate to further delineate.   Stable scarring and fibrotic changes along the lungs with a tiny nodular area along the superior segment of the left lower lobe. Simple continued follow up.   Enlarged heart.   10/09/2023 Imaging   CT chest abdomen and pelvis with contrast  MPRESSION: 1. Persistent circumferential wall thickening in the rectum, not substantially changed taking into account less rectal distention on the current study. 2. Marked stool volume throughout the colon proximal to the rectum, similar to minimally progressive in the interval. While this may reflect clinical constipation, a degree of stricture related to the rectal neoplasm cannot be excluded. 3. No findings on the current study to suggest progressive or metastatic disease. 4. Heterogeneous mineralization of  the sacrum, as before. Nonspecific by CT, features may reflect old trauma.      Discussed the use of AI scribe software for clinical note transcription with the patient, who gave verbal consent to proceed.  History of Present Illness Jillian Lutz is a 73 year old female with metastatic rectal cancer who presents with severe throat pain.  She has experienced severe throat pain for four days, especially when swallowing food or water. There is a slight fever. Swallowing causes significant discomfort. She has a minor cough with no phlegm production. She is on Xeloda  (capecitabine ) for chemotherapy, taking doses twice daily. For pain, she uses tramadol  twice daily, with about twenty tablets remaining. She maintains hydration by drinking water in the morning and uses over-the-counter remedies for throat pain, which provide some relief.     All other systems were reviewed with the patient and are negative.  MEDICAL HISTORY:  Past Medical History:  Diagnosis Date   Bilateral wrist pain 10/25/2017   Hypertension    Left leg DVT (deep venous thrombosis) (HCC) 06/2022   Neck mass 1998   Unknown biopsy results. Excised in [place].   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   Rectal cancer (HCC)    Trigger finger, acquired 02/08/2017    SURGICAL HISTORY: Past Surgical History:  Procedure Laterality Date   BRONCHIAL BIOPSY  12/13/2021   Procedure: BRONCHIAL BIOPSIES;  Surgeon: [name], DO;  Location: [facility] ENDOSCOPY;  Service: Pulmonary;;   BRONCHIAL NEEDLE ASPIRATION BIOPSY  12/13/2021   Procedure: BRONCHIAL NEEDLE ASPIRATION BIOPSIES;  Surgeon: [name], DO;  Location: [facility] ENDOSCOPY;  Service: Pulmonary;;   IR IMAGING  GUIDED PORT INSERTION  09/17/2023   NECK SURGERY Left 1998   Excision of mass in [place]   VIDEO BRONCHOSCOPY WITH RADIAL ENDOBRONCHIAL ULTRASOUND  12/13/2021   Procedure: RADIAL ENDOBRONCHIAL ULTRASOUND;  Surgeon: [name], DO;   Location: [facility] ENDOSCOPY;  Service: Pulmonary;;    I have reviewed the social history and family history with the patient and they are unchanged from previous note.  ALLERGIES:  is allergic to no known allergies.  MEDICATIONS:  Current Outpatient Medications  Medication Sig Dispense Refill   amLODipine  (NORVASC ) 5 MG tablet Take 1 tablet (5 mg total) by mouth daily. 90 tablet 3   brimonidine  (ALPHAGAN ) 0.2 % ophthalmic solution Place 1 drop into the right eye 2 (two) times daily. (Patient not taking: Reported on 01/28/2024) 10 mL 11   capecitabine  (XELODA ) 500 MG tablet Take 2 tabs every 12 hours, for 14 days then off for 7 days. Take 30mins after meal 56 tablet 2   dexamethasone  (DECADRON ) 4 MG tablet Take 1 tablet (4 mg total) by mouth daily. TAKE DAILY FOR 3-5 DAYS AFTER IV CHEMO 20 tablet 1   diclofenac  Sodium (VOLTAREN ) 1 % GEL Apply 4 grams topically 4 (four) times daily. 100 g 3   dorzolamide -timolol  (COSOPT ) 2-0.5 % ophthalmic solution Instill 1 drop into both eyes twice a day 10 mL 11   gabapentin  (NEURONTIN ) 100 MG capsule Take 1 capsule (100 mg total) by mouth 3 (three) times daily as needed. 90 capsule 2   lidocaine  (XYLOCAINE ) 5 % ointment Apply to anal area daily as needed. 50 g 1   lidocaine -prilocaine  (EMLA ) cream Apply topically daily as needed. 30 g 3   lisinopril -hydrochlorothiazide  (ZESTORETIC ) 20-12.5 MG tablet Take 2 tablets by mouth daily. 60 tablet 3   mirtazapine  (REMERON ) 30 MG tablet Take 1 tablet (30 mg total) by mouth at bedtime. 30 tablet 3   Multiple Vitamins-Minerals (MULTIVITAMIN WITH MINERALS) tablet Take 1 tablet by mouth daily. Gummy 50 mg     ondansetron  (ZOFRAN ) 8 MG tablet Take 1 tablet (8 mg total) by mouth every 8 (eight) hours as needed for nausea or vomiting. Take after 3 days of chemo 30 tablet 2   polyethylene glycol powder (GLYCOLAX/MIRALAX) 17 GM/SCOOP powder Take 1 Container by mouth once.     potassium chloride  (KLOR-CON  M) 10 MEQ tablet Take 1  tablet (10 mEq total) by mouth daily. 90 tablet 3   prochlorperazine  (COMPAZINE ) 10 MG tablet Take 1 tablet (10 mg total) by mouth every 6 (six) hours as needed. 30 tablet 2   rivaroxaban  (XARELTO ) 20 MG TABS tablet Take 1 tablet (20 mg total) by mouth daily with supper. 30 tablet 2   traMADol  (ULTRAM ) 50 MG tablet Take 1 tablet (50 mg total) by mouth every 12 (twelve) hours as needed. 60 tablet 0   urea  (CARMOL) 10 % cream Apply topically 2 times daily as needed. 85 g 3   No current facility-administered medications for this visit.    PHYSICAL EXAMINATION: ECOG PERFORMANCE STATUS: 1 - Symptomatic but completely ambulatory  Vitals:   02/18/24 1104  BP: 124/68  Pulse: 70  Resp: 15  Temp: 98.7 F (37.1 C)  SpO2: 97%   Wt Readings from Last 3 Encounters:  02/18/24 109 lb (49.4 kg)  02/14/24 105 lb 12.8 oz (48 kg)  01/28/24 106 lb 4.8 oz (48.2 kg)     GENERAL:alert, no distress and comfortable SKIN: skin color, texture, turgor are normal, no rashes or significant lesions ORAL: Mucosa  edema in the back of throat, no exudation in the tonsil EYES: normal, Conjunctiva are pink and non-injected, sclera clear NECK: supple, thyroid normal size, non-tender, without nodularity LYMPH:  no palpable lymphadenopathy in the cervical, axillary  LUNGS: clear to auscultation and percussion with normal breathing effort HEART: regular rate & rhythm and no murmurs and no lower extremity edema ABDOMEN:abdomen soft, non-tender and normal bowel sounds Musculoskeletal:no cyanosis of digits and no clubbing  NEURO: alert & oriented x 3 with fluent speech, no focal motor/sensory deficits  Physical Exam   LABORATORY DATA:  I have reviewed the data as listed    Latest Ref Rng & Units 02/18/2024   10:44 AM 01/28/2024   11:15 AM 01/07/2024   11:20 AM  CBC  WBC 4.0 - 10.5 K/uL 6.4  5.3  4.2   Hemoglobin 12.0 - 15.0 g/dL 9.7  9.8  89.9   Hematocrit 36.0 - 46.0 % 29.0  29.4  29.9   Platelets 150 - 400  K/uL 231  235  202         Latest Ref Rng & Units 02/18/2024   10:44 AM 01/28/2024   11:15 AM 01/07/2024   11:20 AM  CMP  Glucose 70 - 99 mg/dL 890  87  890   BUN 8 - 23 mg/dL 16  18  16    Creatinine 0.44 - 1.00 mg/dL 9.19  9.20  9.19   Sodium 135 - 145 mmol/L 140  138  141   Potassium 3.5 - 5.1 mmol/L 3.4  3.4  3.3   Chloride 98 - 111 mmol/L 104  105  107   CO2 22 - 32 mmol/L 32  30  29   Calcium 8.9 - 10.3 mg/dL 8.6  8.5  8.6   Total Protein 6.5 - 8.1 g/dL 6.9  6.5  6.7   Total Bilirubin 0.0 - 1.2 mg/dL 0.4  0.5  0.3   Alkaline Phos 38 - 126 U/L 53  52  52   AST 15 - 41 U/L 14  16  12    ALT 0 - 44 U/L 5  5  5        RADIOGRAPHIC STUDIES: I have personally reviewed the radiological images as listed and agreed with the findings in the report. No results found.    Orders Placed This Encounter  Procedures   CBC with Differential (Cancer Center Only)    Standing Status:   Future    Expected Date:   02/25/2024    Expiration Date:   02/24/2025   CMP (Cancer Center only)    Standing Status:   Future    Expected Date:   02/25/2024    Expiration Date:   02/24/2025   CBC with Differential (Cancer Center Only)    Standing Status:   Future    Expected Date:   05/19/2024    Expiration Date:   05/19/2025   CMP (Cancer Center only)    Standing Status:   Future    Expected Date:   05/19/2024    Expiration Date:   05/19/2025   CBC with Differential (Cancer Center Only)    Standing Status:   Future    Expected Date:   06/09/2024    Expiration Date:   06/09/2025   All questions were answered. The patient knows to call the clinic with any problems, questions or concerns. No barriers to learning was detected. The total time spent in the appointment was 30 minutes, including review of chart and various tests  results, discussions about plan of care and coordination of care plan     Onita Mattock, MD 02/18/2024

## 2024-02-18 NOTE — Congregational Nurse Program (Signed)
 Accompanied patient to [facility] visit

## 2024-02-18 NOTE — Telephone Encounter (Signed)
 Scheduled transportation for the following appointments: 1) 02/27/24  9:30 am [facility] 2)  02/29/2024  3:00 pm [facility]  3)  03/19/2024  9:45 am [facility] [address]

## 2024-02-19 ENCOUNTER — Other Ambulatory Visit: Payer: Self-pay

## 2024-02-20 DIAGNOSIS — C20 Malignant neoplasm of rectum: Secondary | ICD-10-CM | POA: Diagnosis not present

## 2024-02-27 ENCOUNTER — Inpatient Hospital Stay

## 2024-02-27 ENCOUNTER — Inpatient Hospital Stay: Admitting: Hematology

## 2024-02-27 ENCOUNTER — Other Ambulatory Visit (HOSPITAL_COMMUNITY): Payer: Self-pay

## 2024-02-27 ENCOUNTER — Encounter: Payer: Self-pay | Admitting: Hematology

## 2024-02-27 ENCOUNTER — Other Ambulatory Visit: Payer: Self-pay

## 2024-02-27 VITALS — BP 113/74 | HR 66 | Temp 98.4°F | Resp 15 | Ht <= 58 in | Wt 106.9 lb

## 2024-02-27 DIAGNOSIS — K6289 Other specified diseases of anus and rectum: Secondary | ICD-10-CM

## 2024-02-27 DIAGNOSIS — I82432 Acute embolism and thrombosis of left popliteal vein: Secondary | ICD-10-CM

## 2024-02-27 DIAGNOSIS — I10 Essential (primary) hypertension: Secondary | ICD-10-CM | POA: Diagnosis not present

## 2024-02-27 DIAGNOSIS — C20 Malignant neoplasm of rectum: Secondary | ICD-10-CM | POA: Diagnosis not present

## 2024-02-27 DIAGNOSIS — E876 Hypokalemia: Secondary | ICD-10-CM

## 2024-02-27 DIAGNOSIS — Z95828 Presence of other vascular implants and grafts: Secondary | ICD-10-CM

## 2024-02-27 DIAGNOSIS — R202 Paresthesia of skin: Secondary | ICD-10-CM | POA: Diagnosis not present

## 2024-02-27 DIAGNOSIS — R630 Anorexia: Secondary | ICD-10-CM | POA: Diagnosis not present

## 2024-02-27 DIAGNOSIS — Z5111 Encounter for antineoplastic chemotherapy: Secondary | ICD-10-CM | POA: Diagnosis not present

## 2024-02-27 LAB — CBC WITH DIFFERENTIAL (CANCER CENTER ONLY)
Abs Immature Granulocytes: 0.02 K/uL (ref 0.00–0.07)
Basophils Absolute: 0.1 K/uL (ref 0.0–0.1)
Basophils Relative: 1 %
Eosinophils Absolute: 0.1 K/uL (ref 0.0–0.5)
Eosinophils Relative: 2 %
HCT: 26.3 % — ABNORMAL LOW (ref 36.0–46.0)
Hemoglobin: 8.8 g/dL — ABNORMAL LOW (ref 12.0–15.0)
Immature Granulocytes: 0 %
Lymphocytes Relative: 17 %
Lymphs Abs: 1.1 K/uL (ref 0.7–4.0)
MCH: 27.8 pg (ref 26.0–34.0)
MCHC: 33.5 g/dL (ref 30.0–36.0)
MCV: 83.2 fL (ref 80.0–100.0)
Monocytes Absolute: 0.6 K/uL (ref 0.1–1.0)
Monocytes Relative: 10 %
Neutro Abs: 4.4 K/uL (ref 1.7–7.7)
Neutrophils Relative %: 70 %
Platelet Count: 249 K/uL (ref 150–400)
RBC: 3.16 MIL/uL — ABNORMAL LOW (ref 3.87–5.11)
RDW: 18.4 % — ABNORMAL HIGH (ref 11.5–15.5)
WBC Count: 6.3 K/uL (ref 4.0–10.5)
nRBC: 0 % (ref 0.0–0.2)

## 2024-02-27 LAB — CMP (CANCER CENTER ONLY)
ALT: <5 U/L (ref 0–44)
AST: 13 U/L — ABNORMAL LOW (ref 15–41)
Albumin: 3.5 g/dL (ref 3.5–5.0)
Alkaline Phosphatase: 53 U/L (ref 38–126)
Anion gap: 4 — ABNORMAL LOW (ref 5–15)
BUN: 23 mg/dL (ref 8–23)
CO2: 31 mmol/L (ref 22–32)
Calcium: 8.5 mg/dL — ABNORMAL LOW (ref 8.9–10.3)
Chloride: 101 mmol/L (ref 98–111)
Creatinine: 0.85 mg/dL (ref 0.44–1.00)
GFR, Estimated: >60 mL/min (ref >60)
Glucose, Bld: 190 mg/dL — ABNORMAL HIGH (ref 70–99)
Potassium: 3.1 mmol/L — ABNORMAL LOW (ref 3.5–5.1)
Sodium: 136 mmol/L (ref 135–145)
Total Bilirubin: 0.4 mg/dL (ref 0.0–1.2)
Total Protein: 6.7 g/dL (ref 6.5–8.1)

## 2024-02-27 MED ORDER — RIVAROXABAN 20 MG PO TABS
20.0000 mg | ORAL_TABLET | Freq: Every day | ORAL | 2 refills | Status: DC
  Filled 2024-02-27 (×2): qty 30, 30d supply, fill #0
  Filled 2024-03-21: qty 30, 30d supply, fill #1
  Filled 2024-04-20: qty 30, 30d supply, fill #2

## 2024-02-27 MED ORDER — GABAPENTIN 100 MG PO CAPS
100.0000 mg | ORAL_CAPSULE | Freq: Three times a day (TID) | ORAL | 2 refills | Status: DC | PRN
  Filled 2024-02-27: qty 90, 30d supply, fill #0

## 2024-02-27 MED ORDER — SODIUM CHLORIDE 0.9% FLUSH
10.0000 mL | Freq: Once | INTRAVENOUS | Status: AC
Start: 2024-02-27 — End: 2024-02-27
  Administered 2024-02-27 (×2): 10 mL

## 2024-02-27 MED ORDER — POTASSIUM CHLORIDE CRYS ER 10 MEQ PO TBCR
10.0000 meq | EXTENDED_RELEASE_TABLET | Freq: Two times a day (BID) | ORAL | 3 refills | Status: DC
  Filled 2024-02-27 – 2024-02-29 (×4): qty 60, 30d supply, fill #0
  Filled 2024-02-29: qty 6, 3d supply, fill #0
  Filled 2024-02-29: qty 60, 30d supply, fill #0
  Filled 2024-02-29: qty 54, 27d supply, fill #0
  Filled 2024-03-24: qty 60, 30d supply, fill #1
  Filled 2024-04-22: qty 60, 30d supply, fill #2

## 2024-02-27 MED ORDER — DEXTROSE 5 % IV SOLN: Freq: Once | INTRAVENOUS | Status: AC

## 2024-02-27 MED ORDER — SODIUM CHLORIDE 0.9% FLUSH
10.0000 mL | INTRAVENOUS | Status: DC | PRN
Start: 2024-02-27 — End: 2024-02-27

## 2024-02-27 MED ORDER — OXALIPLATIN CHEMO INJECTION 100 MG/20ML
70.0000 mg/m2 | Freq: Once | INTRAVENOUS | Status: AC
  Administered 2024-02-27 (×2): 100 mg via INTRAVENOUS
  Filled 2024-02-27: qty 20

## 2024-02-27 MED ORDER — SODIUM CHLORIDE 0.9 % IV SOLN: Freq: Once | INTRAVENOUS | Status: AC

## 2024-02-27 MED ORDER — DEXAMETHASONE SODIUM PHOSPHATE 10 MG/ML IJ SOLN
10.0000 mg | Freq: Once | INTRAMUSCULAR | Status: AC
  Administered 2024-02-27 (×2): 10 mg via INTRAVENOUS
  Filled 2024-02-27: qty 1

## 2024-02-27 MED ORDER — AMLODIPINE BESYLATE 5 MG PO TABS
5.0000 mg | ORAL_TABLET | Freq: Every day | ORAL | 3 refills | Status: AC
  Filled 2024-02-27 – 2024-02-29 (×5): qty 90, 90d supply, fill #0
  Filled 2024-05-19: qty 90, 90d supply, fill #1
  Filled 2024-08-20: qty 90, 90d supply, fill #2

## 2024-02-27 MED ORDER — DIPHENHYDRAMINE HCL 50 MG/ML IJ SOLN
25.0000 mg | Freq: Once | INTRAMUSCULAR | Status: AC
  Administered 2024-02-27 (×2): 25 mg via INTRAVENOUS
  Filled 2024-02-27: qty 1

## 2024-02-27 MED ORDER — PALONOSETRON HCL INJECTION 0.25 MG/5ML
0.2500 mg | Freq: Once | INTRAVENOUS | Status: AC
  Administered 2024-02-27 (×2): 0.25 mg via INTRAVENOUS
  Filled 2024-02-27: qty 5

## 2024-02-27 MED ORDER — TRAMADOL HCL 50 MG PO TABS
50.0000 mg | ORAL_TABLET | Freq: Two times a day (BID) | ORAL | 0 refills | Status: DC | PRN
  Filled 2024-02-27 (×2): qty 60, 30d supply, fill #0

## 2024-02-27 MED ORDER — FAMOTIDINE IN NACL 20-0.9 MG/50ML-% IV SOLN
20.0000 mg | Freq: Once | INTRAVENOUS | Status: AC
  Administered 2024-02-27 (×2): 20 mg via INTRAVENOUS
  Filled 2024-02-27: qty 50

## 2024-02-27 MED ORDER — SODIUM CHLORIDE 0.9 % IV SOLN
7.5000 mg/kg | Freq: Once | INTRAVENOUS | Status: AC
  Administered 2024-02-27 (×2): 350 mg via INTRAVENOUS
  Filled 2024-02-27: qty 14

## 2024-02-27 MED ORDER — MIRTAZAPINE 30 MG PO TABS
30.0000 mg | ORAL_TABLET | Freq: Every day | ORAL | 3 refills | Status: DC
  Filled 2024-02-27 (×2): qty 30, 30d supply, fill #0
  Filled 2024-03-22: qty 30, 30d supply, fill #1
  Filled 2024-04-21: qty 30, 30d supply, fill #2

## 2024-02-27 NOTE — Progress Notes (Signed)
 [facility]   [phone] [phone]   Clinic Follow up Note   Patient Care Team: [name], DO as PCP - General (Internal Medicine) [name], NP as Nurse Practitioner (Nurse Practitioner) [name], MD as Consulting Physician (Hematology and Oncology) [name], MD as Consulting Physician (Radiation Oncology) [name], [name], MD as Consulting Physician (Gastroenterology) [name], DO as Consulting Physician (Pulmonary Disease) [name], MD as Consulting Physician (General Surgery)  Date of Service:  02/27/2024  CHIEF COMPLAINT: f/u of rectal cancer  CURRENT THERAPY:  CapeOx and bevacizumab  every 3 weeks except oxaliplatin  every 6 weeks  Oncology History   Rectal adenocarcinoma (HCC) cT3cN1M0 stage IIIb, biopsy confirmed residual primary tumor and oligo lung met in 09/2021 -Initially diagnosed 11/2020 by colonoscopy for progressive rectal pain and bleeding, weight loss, and constipation. -Despite strong recommendation, patient firmly and repeatedly declined IV chemo and surgery. She agreed to chemoRT with Xeloda , and received the treatment 01/04/21 - 02/14/21. -she developed local cancer progression and lung mets in 11/2021 -she started Xeloda  on 02/16/2022, low dose oxaliplatin  was added on 05/11/22 (with cycle 4 of Xeloda )  -chemo held since 07/2022 due to leg pain and recurrent nausea  -restaging CT 09/07/2022 showed stable disease in rectum and lung, no new lesions.   -she had chemo break from Feb-May 2024 -restart CAPOX with dose reduction 12/18/2022, not tolerating very well overall.  Oxaliplatin  was held on August 5. She is on oxaliplatin  every 6 weeks now.  -Restaging CT scan from 10/09/2023 showed stable rectal wall thickening, no other  evidence of metastasis  - Restaging CT scan from January 21, 2024 showed diminished circumferential wall thickening of a low rectal mass, no evidence of distant metastasis.  Left leg DVT  (HCC) -Doppler on July 13, 2022 showed left acute DVT involving popliteal and peroneal vein, CTA chest was negative for PE. -continue Xarelto , tolerating well   Assessment & Plan Rectal cancer with associated anemia Rectal cancer with recent episodes of hematochezia from Friday to Monday, occurring 4-5 times a day. Hemoglobin has dropped from 9.7 to 8.8. Reports some soreness currently. Recent bowel movements have been normal without blood. - Proceed with chemotherapy today, including bevacizumab  and oxaliplatin . - Administer bevacizumab  every three weeks. - Administer chemotherapy every six weeks. - Provide up to six weeks of chemotherapy pills if traveling to [place]. - Advise seeing a doctor in [place] for ongoing care if traveling.  Hypokalemia Potassium levels remain low. Last potassium pill was taken last Thursday. - Refill potassium medication.  Plan - Lab reviewed, adequate for treatment, will proceed with oxaliplatin  and bevacizumab  today - Will continue capecitabine  at home at the same dose - Lab and follow-up in 3 weeks   SUMMARY OF ONCOLOGIC HISTORY: Oncology History Overview Note  Cancer Staging Rectal adenocarcinoma [facility]) Staging form: Colon and Rectum, AJCC 8th Edition - Clinical stage from 12/21/2020: Stage IIIB (cT3, cN1, cM0) - Unsigned    Rectal adenocarcinoma (HCC)  08/26/2020 Miscellaneous   Initial presentation to PCP, reporting intermittent BRBPR since 02/2020    12/01/2020 Procedure   Colonoscopy by Dr. Shila findings - The perianal and digital rectal examinations were normal. - A 15 mm polyp was found in the ascending colon. The polyp was semi-pedunculated. Resection and retrieval were complete. - A 25 mm polyp was found in the sigmoid colon. The polyp was pedunculated. Resection and retrieval were complete. - An infiltrative partially obstructing large mass was found in the proximal rectum.  The mass was partially circumferential (involving one-half  of the lumen circumference). The mass measured eight cm in length extending from 10 to 18cm from anal verge. This was biopsied with a cold forceps for histology. Proximal and distal opposite fold area of the mass lesion was tattooed with an injection of total 3 mL of Spot (carbon black).   12/01/2020 Initial Biopsy   Diagnosis 1. Colon, polyp(s), ascending x 1 - ADENOCARCINOMA ARISING IN A TUBULAR ADENOMA WITH HIGH-GRADE DYSPLASIA. SEE NOTE 2. Colon, sigmoid polyp, x1 - TUBULOVILLOUS ADENOMA(S) - NEGATIVE FOR HIGH-GRADE DYSPLASIA OR MALIGNANCY 3. Rectum, biopsy - ADENOCARCINOMA. SEE NOTE   12/01/2020 Cancer Staging   Cancer Staging Rectal adenocarcinoma [facility]) Staging form: Colon and Rectum, AJCC 8th Edition - Clinical stage from 12/21/2020: Stage IIIB (cT3, cN1, cM0) - Unsigned    12/06/2020 Imaging   CT CAP with contrast IMPRESSION: 1. There is partially circumferential soft tissue thickening of the mid to superior rectum, approximately 5 cm in length and the inferior extent approximately 6 cm above the anal verge, consistent with primary rectal malignancy identified by colonoscopy. 2. There appear to be abnormally enlarged perirectal lymph nodes posteriorly about the superior rectum measuring up to 1.0 x 0.8 cm. Findings are suspicious for perirectal nodal metastatic disease, however rectal MRI is the test of choice for initial local staging of rectal cancer. 3. There is a 4 mm nonspecific pulmonary nodule of the superior segment left lower lobe, statistically most likely incidental, infectious or inflammatory, although nonspecific and isolated metastatic disease is not strictly excluded. Attention on follow-up. 4. No other evidence of metastatic disease in the chest, abdomen, or pelvis. Aortic Atherosclerosis (ICD10-I70.0).   12/09/2020 Imaging   Local staging MRI pelvis without contrast IMPRESSION: 4.9 cm circumferential mid/lower rectal mass, corresponding to the patient's  newly diagnosed rectal cancer. Rectal adenocarcinoma T stage: T3c Rectal adenocarcinoma N stage:  N1 Distance from tumor to the internal anal sphincter is 3.7 cm.   12/21/2020 Initial Diagnosis   Rectal adenocarcinoma (HCC)   01/04/2021 -  Chemotherapy   Concurrent chemoradiation with Xeloda  1000mg  in the AM and 1500mg  in the PM on days of Radiation starting 01/04/21.       01/04/2021 - 02/11/2021 Radiation Therapy   Concurrent chemoradiation with Dr Dewey and Xeloda  starting 01/04/21.    01/31/2022 Procedure   Colonoscopy, Dr. Shila  Findings: - An infiltrative partially obstructing large mass was found in the rectum. The mass was circumferential. The mass measured ten cm in length, extending from 5-15cm from anal verge. In addition, rectal diameter at narrowest approx twelve mm. Oozing was present. Biopsies were taken with a cold forceps for histology.  Impression: - Hemorrhoids found on perianal exam. - Likely malignant partially obstructing tumor in the rectum. Biopsied. - Non-bleeding external and internal hemorrhoids.   01/31/2022 Pathology Results   Diagnosis Rectum, biopsy INVASIVE MODERATELY DIFFERENTIATED ADENOCARCINOMA   05/11/2022 -  Chemotherapy   Patient is on Treatment Plan : COLORECTAL Xelox (Capeox)(130/850) q21d     09/07/2022 Imaging    IMPRESSION: 1. No significant change in circumferential wall thickening of the rectum, in keeping with known primary rectal mass. Similar appearance of post treatment perirectal and presacral fat stranding and fascial thickening 2. Unchanged nodule of the superior segment left lower lobe with adjacent bandlike scarring. No new nodules. 3. No evidence of lymphadenopathy or other metastatic disease in the chest, abdomen, or pelvis. 4. Large burden of stool throughout the colon. 5. Cardiomegaly.   12/08/2022 Imaging  IMPRESSION: Persistent wall thickening along the rectum with adjacent severe inflammatory stranding and  nodularity. Please correlate with exact location of the distribution of the patient's neoplasm.   No discrete new mass lesion, fluid collection or lymph node enlargement.   Improved visualization of the fractures involving the left side of the pubic symphysis as well as probable insufficiency fractures along the sacrum. Associated areas of increasing heterogeneous bony sclerosis along the pubic bones and sacrum. In principle sclerotic bone metastases would be in the differential but are felt to be less likely based on the overall appearance. If needed confirmatory MRI can be considered as clinically appropriate to further delineate.   Stable scarring and fibrotic changes along the lungs with a tiny nodular area along the superior segment of the left lower lobe. Simple continued follow up.   Enlarged heart.   10/09/2023 Imaging   CT chest abdomen and pelvis with contrast  MPRESSION: 1. Persistent circumferential wall thickening in the rectum, not substantially changed taking into account less rectal distention on the current study. 2. Marked stool volume throughout the colon proximal to the rectum, similar to minimally progressive in the interval. While this may reflect clinical constipation, a degree of stricture related to the rectal neoplasm cannot be excluded. 3. No findings on the current study to suggest progressive or metastatic disease. 4. Heterogeneous mineralization of the sacrum, as before. Nonspecific by CT, features may reflect old trauma.      Discussed the use of AI scribe software for clinical note transcription with the patient, who gave verbal consent to proceed.  History of Present Illness Jillian Lutz is a 73 year old female with rectal cancer who presents for follow-up.  She experiences rectal bleeding from Friday to Monday, with blood in the stool four to five times daily. The bleeding is accompanied by a sore sensation. Her hemoglobin level has decreased from  9.7 to 8.8 since last week. She is undergoing chemotherapy and is concerned about continuing treatment during a planned three-month trip to [place]. Her current medications include gabapentin , potassium, blood pressure medication, tramadol , and mirtazapine . She last took potassium last Thursday. No current pain is reported.     All other systems were reviewed with the patient and are negative.  MEDICAL HISTORY:  Past Medical History:  Diagnosis Date   Bilateral wrist pain 10/25/2017   Hypertension    Left leg DVT (deep venous thrombosis) (HCC) 06/2022   Neck mass 1998   Unknown biopsy results. Excised in [place].   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   Rectal cancer (HCC)    Trigger finger, acquired 02/08/2017    SURGICAL HISTORY: Past Surgical History:  Procedure Laterality Date   BRONCHIAL BIOPSY  12/13/2021   Procedure: BRONCHIAL BIOPSIES;  Surgeon: [name], DO;  Location: [facility];  Service: Pulmonary;;   BRONCHIAL NEEDLE ASPIRATION BIOPSY  12/13/2021   Procedure: BRONCHIAL NEEDLE ASPIRATION BIOPSIES;  Surgeon: [name], DO;  Location: [facility];  Service: Pulmonary;;   IR IMAGING GUIDED PORT INSERTION  09/17/2023   NECK SURGERY Left 1998   Excision of mass in [place]   VIDEO BRONCHOSCOPY WITH RADIAL ENDOBRONCHIAL ULTRASOUND  12/13/2021   Procedure: RADIAL ENDOBRONCHIAL ULTRASOUND;  Surgeon: [name], DO;  Location: [facility]OS[facility];  Service: Pulmonary;;    I have reviewed the social history and family history with the patient and they are unchanged from previous note.  ALLERGIES:  is allergic to no known allergies.  MEDICATIONS:  Current Outpatient Medications  Medication Sig Dispense Refill   amLODipine  (NORVASC ) 5 MG tablet Take 1 tablet (5 mg total) by mouth daily. 90 tablet 3   brimonidine  (ALPHAGAN ) 0.2 % ophthalmic solution Place 1 drop into the right eye 2 (two) times daily. (Patient not taking: Reported  on 01/28/2024) 10 mL 11   capecitabine  (XELODA ) 500 MG tablet Take 2 tabs every 12 hours, for 14 days then off for 7 days. Take 30mins after meal 56 tablet 2   dexamethasone  (DECADRON ) 4 MG tablet Take 1 tablet (4 mg total) by mouth daily. TAKE DAILY FOR 3-5 DAYS AFTER IV CHEMO 20 tablet 1   diclofenac  Sodium (VOLTAREN ) 1 % GEL Apply 4 grams topically 4 (four) times daily. 100 g 3   dorzolamide -timolol  (COSOPT ) 2-0.5 % ophthalmic solution Instill 1 drop into both eyes twice a day 10 mL 11   gabapentin  (NEURONTIN ) 100 MG capsule Take 1 capsule (100 mg total) by mouth 3 (three) times daily as needed. 90 capsule 2   lidocaine  (XYLOCAINE ) 5 % ointment Apply to anal area daily as needed. 50 g 1   lidocaine -prilocaine  (EMLA ) cream Apply topically daily as needed. 30 g 3   lisinopril -hydrochlorothiazide  (ZESTORETIC ) 20-12.5 MG tablet Take 2 tablets by mouth daily. 60 tablet 3   mirtazapine  (REMERON ) 30 MG tablet Take 1 tablet (30 mg total) by mouth at bedtime. 30 tablet 3   Multiple Vitamins-Minerals (MULTIVITAMIN WITH MINERALS) tablet Take 1 tablet by mouth daily. Gummy 50 mg     ondansetron  (ZOFRAN ) 8 MG tablet Take 1 tablet (8 mg total) by mouth every 8 (eight) hours as needed for nausea or vomiting. Take after 3 days of chemo 30 tablet 2   polyethylene glycol powder (GLYCOLAX/MIRALAX) 17 GM/SCOOP powder Take 1 Container by mouth once.     potassium chloride  (KLOR-CON  M) 10 MEQ tablet Take 1 tablet (10 mEq total) by mouth 2 (two) times daily. 60 tablet 3   prochlorperazine  (COMPAZINE ) 10 MG tablet Take 1 tablet (10 mg total) by mouth every 6 (six) hours as needed. 30 tablet 2   rivaroxaban  (XARELTO ) 20 MG TABS tablet Take 1 tablet (20 mg total) by mouth daily with supper. 30 tablet 2   traMADol  (ULTRAM ) 50 MG tablet Take 1 tablet (50 mg total) by mouth every 12 (twelve) hours as needed. 60 tablet 0   urea  (CARMOL) 10 % cream Apply topically 2 times daily as needed. 85 g 3   No current  facility-administered medications for this visit.   Facility-Administered Medications Ordered in Other Visits  Medication Dose Route Frequency Provider Last Rate Last Admin   sodium chloride  flush (NS) 0.9 % injection 10 mL  10 mL Intracatheter PRN [name], MD        PHYSICAL EXAMINATION: ECOG PERFORMANCE STATUS: 1 - Symptomatic but completely ambulatory  Vitals:   02/27/24 1010  BP: 113/74  Pulse: 66  Resp: 15  Temp: 98.4 F (36.9 C)  SpO2: 100%   Wt Readings from Last 3 Encounters:  02/27/24 106 lb 14.4 oz (48.5 kg)  02/18/24 109 lb (49.4 kg)  02/14/24 105 lb 12.8 oz (48 kg)     GENERAL:alert, no distress and comfortable SKIN: skin color, texture, turgor are normal, no rashes or significant lesions EYES: normal, Conjunctiva are pink and non-injected, sclera clear NECK: supple, thyroid normal size, non-tender, without nodularity LYMPH:  no palpable lymphadenopathy in the cervical, axillary  LUNGS: clear to auscultation and percussion with normal breathing effort  HEART: regular rate & rhythm and no murmurs and no lower extremity edema ABDOMEN:abdomen soft, non-tender and normal bowel sounds Musculoskeletal:no cyanosis of digits and no clubbing  NEURO: alert & oriented x 3 with fluent speech, no focal motor/sensory deficits  Physical Exam    LABORATORY DATA:  I have reviewed the data as listed    Latest Ref Rng & Units 02/27/2024    9:43 AM 02/18/2024   10:44 AM 01/28/2024   11:15 AM  CBC  WBC 4.0 - 10.5 K/uL 6.3  6.4  5.3   Hemoglobin 12.0 - 15.0 g/dL 8.8  9.7  9.8   Hematocrit 36.0 - 46.0 % 26.3  29.0  29.4   Platelets 150 - 400 K/uL 249  231  235         Latest Ref Rng & Units 02/27/2024    9:43 AM 02/18/2024   10:44 AM 01/28/2024   11:15 AM  CMP  Glucose 70 - 99 mg/dL 809  890  87   BUN 8 - 23 mg/dL 23  16  18    Creatinine 0.44 - 1.00 mg/dL 9.14  9.19  9.20   Sodium 135 - 145 mmol/L 136  140  138   Potassium 3.5 - 5.1 mmol/L 3.1  3.4  3.4   Chloride 98 -  111 mmol/L 101  104  105   CO2 22 - 32 mmol/L 31  32  30   Calcium 8.9 - 10.3 mg/dL 8.5  8.6  8.5   Total Protein 6.5 - 8.1 g/dL 6.7  6.9  6.5   Total Bilirubin 0.0 - 1.2 mg/dL 0.4  0.4  0.5   Alkaline Phos 38 - 126 U/L 53  53  52   AST 15 - 41 U/L 13  14  16    ALT 0 - 44 U/L <5  <5  5       RADIOGRAPHIC STUDIES: I have personally reviewed the radiological images as listed and agreed with the findings in the report. No results found.    No orders of the defined types were placed in this encounter.  All questions were answered. The patient knows to call the clinic with any problems, questions or concerns. No barriers to learning was detected. The total time spent in the appointment was 25 minutes, including review of chart and various tests results, discussions about plan of care and coordination of care plan     Onita Mattock, MD 02/27/2024

## 2024-02-27 NOTE — Assessment & Plan Note (Signed)
 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 -Despite strong recommendation, patient firmly and repeatedly declined IV chemo and surgery. She agreed to chemoRT with Xeloda , and received the treatment 01/04/21 - 02/14/21. -she developed local cancer progression and lung mets in 11/2021 -she started Xeloda  on 02/16/2022, low dose oxaliplatin  was added on 05/11/22 (with cycle 4 of Xeloda )  -chemo held since 07/2022 due to leg pain and recurrent nausea  -restaging CT 09/07/2022 showed stable disease in rectum and lung, no new lesions.   -she had chemo break from Feb-May 2024 -restart CAPOX with dose reduction 12/18/2022, not tolerating very well overall.  Oxaliplatin  was held on August 5. She is on oxaliplatin  every 6 weeks now.  -Restaging CT scan from 10/09/2023 showed stable rectal wall thickening, no other  evidence of metastasis  - Restaging CT scan from January 21, 2024 showed diminished circumferential wall thickening of a low rectal mass, no evidence of distant metastasis.

## 2024-02-27 NOTE — Assessment & Plan Note (Signed)
-  Doppler on July 13, 2022 showed left acute DVT involving popliteal and peroneal vein, CTA chest was negative for PE. -continue Xarelto, tolerating well

## 2024-02-29 ENCOUNTER — Ambulatory Visit: Admitting: Gastroenterology

## 2024-02-29 ENCOUNTER — Encounter: Payer: Self-pay | Admitting: Hematology

## 2024-02-29 ENCOUNTER — Other Ambulatory Visit (HOSPITAL_COMMUNITY): Payer: Self-pay

## 2024-02-29 ENCOUNTER — Encounter: Payer: Self-pay | Admitting: Gastroenterology

## 2024-02-29 VITALS — BP 90/60 | HR 68 | Ht <= 58 in | Wt 106.1 lb

## 2024-02-29 DIAGNOSIS — K5900 Constipation, unspecified: Secondary | ICD-10-CM

## 2024-02-29 DIAGNOSIS — K6289 Other specified diseases of anus and rectum: Secondary | ICD-10-CM

## 2024-02-29 DIAGNOSIS — K625 Hemorrhage of anus and rectum: Secondary | ICD-10-CM | POA: Diagnosis not present

## 2024-02-29 DIAGNOSIS — C20 Malignant neoplasm of rectum: Secondary | ICD-10-CM

## 2024-02-29 MED ORDER — HYDROCORTISONE ACETATE 25 MG RE SUPP
25.0000 mg | Freq: Every day | RECTAL | 1 refills | Status: AC
  Filled 2024-02-29 (×2): qty 12, 12d supply, fill #0
  Filled 2024-03-05: qty 12, 12d supply, fill #1

## 2024-02-29 MED ORDER — NA SULFATE-K SULFATE-MG SULF 17.5-3.13-1.6 GM/177ML PO SOLN
1.0000 | Freq: Once | ORAL | 0 refills | Status: AC
Start: 2024-02-29 — End: 2024-03-01
  Filled 2024-02-29: qty 354, 1d supply, fill #0

## 2024-02-29 NOTE — Patient Instructions (Addendum)
 VISIT SUMMARY:  You visited us  today due to severe rectal pain and bleeding associated with your rectal cancer. We also discussed your bowel habits, recent weight loss, and ongoing oncologic treatments.  YOUR PLAN:  RECTAL CANCER WITH ASSOCIATED PAIN AND BLEEDING: Your rectal cancer is causing persistent pain and bleeding, likely worsened by previous radiation therapy. -We will prescribe steroid suppositories for you to use twice a day as needed to soothe the area, especially during bleeding episodes. -A colonoscopy will be performed to assess the current state of your rectal cancer. -We will refer you to a surgeon to evaluate surgical options, including the possibility of a colostomy. -We will send a note to Dr. Lanny, your cancer specialist, to discuss the possibility of surgery.  CONSTIPATION: You have intermittent constipation with bowel movements occurring every one to two days. -Increase your Miralax to twice daily if your bowel movements are less frequent, especially on days without bowel movements.  Due to recent changes in healthcare laws, you may see the results of your imaging and laboratory studies on MyChart before your provider has had a chance to review them.  We understand that in some cases there may be results that are confusing or concerning to you. Not all laboratory results come back in the same time frame and the provider may be waiting for multiple results in order to interpret others.  Please give us  48 hours in order for your provider to thoroughly review all the results before contacting the office for clarification of your results.    _______________________________________________________  If your blood pressure at your visit was 140/90 or greater, please contact your primary care physician to follow up on this.  _______________________________________________________  If you are age 91 or older, your body mass index should be between 23-30. Your Body mass index is  22.97 kg/m. If this is out of the aforementioned range listed, please consider follow up with your Primary Care Provider.  If you are age 61 or younger, your body mass index should be between 19-25. Your Body mass index is 22.97 kg/m. If this is out of the aformentioned range listed, please consider follow up with your Primary Care Provider.   ________________________________________________________  The [facility] providers would like to encourage you to use MYCHART to communicate with providers for non-urgent requests or questions.  Due to long hold times on the telephone, sending your provider a message by [facility] may be a faster and more efficient way to get a response.  Please allow 48 business hours for a response.  Please remember that this is for non-urgent requests.  _______________________________________________________  [facility] is using a team-based approach to care.  Your team is made up of your doctor and two to three APPS. Our APPS (Nurse Practitioners and Physician Assistants) work with your physician to ensure care continuity for you. They are fully qualified to address your health concerns and develop a treatment plan. They communicate directly with your gastroenterologist to care for you. Seeing the Advanced Practice Practitioners on your physician's team can help you by facilitating care more promptly, often allowing for earlier appointments, access to diagnostic testing, procedures, and other specialty referrals.    I appreciate the  opportunity to care for you  Thank You   Kavitha Nandigam , MD

## 2024-02-29 NOTE — Progress Notes (Signed)
 Jillian Lutz    [id number]    01-15-1951  Primary Care Physician:Ingold, Norman, DO  Referring Physician: Gabino Boga, MD No address on file   Chief complaint: Concern, rectal pain rectal bleeding  Discussed the use of AI scribe software for clinical note transcription with the patient, who gave verbal consent to proceed.  History of Present Illness Jillian Lutz is a 73 year old female with rectal cancer who presents with rectal pain and bleeding.  Rectal pain and bleeding - Severe rectal pain associated with rectal cancer - Tramadol  provides effective pain relief - Rectal bleeding occurred from Thursday to Monday last week  Bowel habit changes and constipation - Bowel movements occur daily some weeks, every two days in others - Uses Miralax, one small lid daily, increases dose if constipated - Uses yogurt to aid bowel movements  Recent weight loss and nutritional status - Weight decreased from 105 pounds to 99 pounds - Weight loss attributed to recent viral infection causing sore throat and odynophagia, resulting in reduced oral intake - Eating has improved as throat symptoms have resolved  Oncologic treatment history - Recently underwent chemotherapy on Wednesday - Currently taking Xeloda  - Received radiation therapy in 2022 - No recent colonoscopy; last colonoscopy in 2023    Colonoscopy January 31, 2022  - Preparation of the colon was fair. - Hemorrhoids found on perianal exam. - Likely malignant partially obstructing tumor in the rectum. Biopsied. Rectum, biopsy INVASIVE MODERATELY DIFFERENTIATED ADENOCARCINOMA  Colonoscopy Dec 01, 2020 - One 15 mm polyp in the ascending colon, removed with a hot snare. Resected and retrieved. - One 25 mm polyp in the sigmoid colon, removed with a hot snare. Resected and retrieved. - Likely malignant partially obstructing tumor in the proximal rectum. Biopsied. Tattooed. 1. 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 SEE NOTE 2. Colon, sigmoid polyp, x1 - TUBULOVILLOUS ADENOMA(S) - NEGATIVE FOR HIGH-GRADE DYSPLASIA OR MALIGNANCY 3. Rectum, biopsy - ADENOCARCINOMA. SEE NOTE  Outpatient Encounter Medications as of 02/29/2024  Medication Sig   amLODipine  (NORVASC ) 5 MG tablet Take 1 tablet (5 mg total) by mouth daily.   brimonidine  (ALPHAGAN ) 0.2 % ophthalmic solution Place 1 drop into the right eye 2 (two) times daily.   capecitabine  (XELODA ) 500 MG tablet Take 2 tabs every 12 hours, for 14 days then off for 7 days. Take after meal   dexamethasone  (DECADRON ) 4 MG tablet Take 1 tablet (4 mg total) by mouth daily. TAKE DAILY FOR 3-5 DAYS AFTER IV CHEMO   diclofenac  Sodium (VOLTAREN ) 1 % GEL Apply 4 grams topically 4 (four) times daily.   dorzolamide -timolol  (COSOPT ) 2-0.5 % ophthalmic solution Instill 1 drop into both eyes twice a day   gabapentin  (NEURONTIN ) 100 MG capsule Take 1 capsule (100 mg total) by mouth 3 (three) times daily as needed.   lidocaine  (XYLOCAINE ) 5 % ointment Apply to anal area daily as needed.   lidocaine -prilocaine  (EMLA ) cream Apply topically daily as needed.   lisinopril -hydrochlorothiazide  (ZESTORETIC ) 20-12.5 MG tablet Take 2 tablets by mouth daily.   mirtazapine  (REMERON ) 30 MG tablet Take 1 tablet (30 mg total) by mouth at bedtime.   Multiple Vitamins-Minerals (MULTIVITAMIN WITH MINERALS) tablet Take 1 tablet by mouth daily. Gummy 50 mg   ondansetron  (ZOFRAN ) 8 MG tablet Take 1 tablet (8 mg total) by mouth every 8 (eight) hours as needed for nausea or vomiting. Take after 3  days of chemo   polyethylene glycol powder (GLYCOLAX/MIRALAX) 17 GM/SCOOP powder Take 1 Container by mouth once.   potassium chloride  (KLOR-CON  M) 10 MEQ tablet Take 1 tablet (10 mEq total) by mouth 2 (two) times daily.   prochlorperazine  (COMPAZINE ) 10 MG tablet Take 1 tablet (10 mg total) by mouth every 6 (six) hours as needed.    rivaroxaban  (XARELTO ) 20 MG TABS tablet Take 1 tablet (20 mg total) by mouth daily with supper.   traMADol  (ULTRAM ) 50 MG tablet Take 1 tablet (50 mg total) by mouth every 12 (twelve) hours as needed.   urea  (CARMOL) 10 % cream Apply topically 2 times daily as needed.   No facility-administered encounter medications on file as of 02/29/2024.    Allergies as of 02/29/2024 - Review Complete 02/29/2024  Allergen Reaction Noted   No known allergies  01/31/2022    Past Medical History:  Diagnosis Date   Bilateral wrist pain 10/25/2017   Hypertension    Left leg DVT (deep venous thrombosis) (HCC) 06/2022   Neck mass 1998   Unknown biopsy results. Excised in [place].   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   Rectal cancer (HCC)    Trigger finger, acquired 02/08/2017    Past Surgical History:  Procedure Laterality Date   BRONCHIAL BIOPSY  12/13/2021   Procedure: BRONCHIAL BIOPSIES;  Surgeon: [name], DO;  Location: [facility];  Service: Pulmonary;;   BRONCHIAL NEEDLE ASPIRATION BIOPSY  12/13/2021   Procedure: BRONCHIAL NEEDLE ASPIRATION BIOPSIES;  Surgeon: [name], DO;  Location: [facility];  Service: Pulmonary;;   IR IMAGING GUIDED PORT INSERTION  09/17/2023   NECK SURGERY Left 1998   Excision of mass in [place]   VIDEO BRONCHOSCOPY WITH RADIAL ENDOBRONCHIAL ULTRASOUND  12/13/2021   Procedure: RADIAL ENDOBRONCHIAL ULTRASOUND;  Surgeon: [name], DO;  Location: [facility];  Service: Pulmonary;;    Family History  Problem Relation Age of Onset   Headache Son    Colon cancer Neg Hx    Stomach cancer Neg Hx    Esophageal cancer Neg Hx     Social History   Socioeconomic History   Marital status: Married    Spouse name: Designer, jewellery   Number of children: Not on file   Years of education: Not on file   Highest education level: Not on file  Occupational History   Not on file  Tobacco Use   Smoking status: Never    Smokeless tobacco: Never  Vaping Use   Vaping status: Never Used  Substance and Sexual Activity   Alcohol use: No   Drug use: No   Sexual activity: Not on file  Other Topics Concern   Not on file  Social History Narrative   Came to [place]  from [place] in 2013.   Does not know much family history [place] care.   Social Drivers of Corporate investment banker Strain: Not on file  Food Insecurity: No Food Insecurity (01/28/2024)   Hunger Vital Sign    Worried About Running Out of Food in the Last Year: Never true    Ran Out of Food in the Last Year: Never true  Transportation Needs: No Transportation Needs (01/28/2024)   PRAPARE - Administrator, Civil Service (Medical): No    Lack of Transportation (Non-Medical): No  Physical Activity: Not on file  Stress: Not on file  Social Connections: Socially Integrated (01/28/2024)   Social Connection and Isolation Panel    Frequency of Communication with Friends and Family: More than three times a week    Frequency of Social Gatherings with Friends and Family: More than three times a week    Attends Religious Services: More than 4 times per year    Active Member of [employer] or Organizations: Yes    Attends Engineer, structural: More than 4 times per year    Marital Status: Married  Catering manager Violence: Not At Risk (01/28/2024)   Humiliation, Afraid, Rape, and Kick questionnaire    Fear of Current or Ex-Partner: No    Emotionally Abused: No    Physically Abused: No    Sexually Abused: No      Review of systems: All other review of systems negative except as mentioned in the HPI.   Physical Exam: Vitals:   02/29/24 1441  BP: 90/60  Pulse: 68   Body mass index is 22.97 kg/m. Gen:      No acute distress HEENT:  sclera anicteric Abd:      soft, non-tender; no palpable masses, no distension Ext:    No edema Neuro: alert and oriented x 3 Psych:  normal mood and affect  Data Reviewed:  Reviewed labs, radiology imaging, old records and pertinent past GI work up     Assessment and Plan Assessment & Plan Rectal cancer with associated pain and bleeding Rectal cancer with persistent pain and bleeding, likely exacerbated by previous radiation therapy. Recent chemotherapy with Xeloda  and IV treatment. Pain and bleeding are primarily due to the cancer and previous radiation. Surgery is a potential option, but previous concerns about a permanent colostomy were noted.  She had previously declined surgery but is willing to consider at this point  Colonoscopy is recommended to assess the current state of the cancer and evaluate the colon to aid in surgical decision-making. Risks of colonoscopy include anesthesia-related risks, bleeding, and perforation, though these are extremely low.  - Prescribe steroid suppositories for use twice a day as needed to decrease rectal bleeding, help with potential radiation proctitis - Perform colonoscopy to assess the current state of the rectal cancer and assess rest of colon - Send a note to Dr. Lanny, the cancer specialist, to discuss the possibility of referral to surgery.  Constipation Intermittent constipation with bowel movements occurring every one to two days. Currently managed with Miralax and occasional yogurt consumption. No hard stools reported. - Increase Miralax to twice daily if bowel movements are less frequent, especially on days without bowel movements.        This visit required >40 minutes of patient care (this includes precharting, chart review, review of results, face-to-face time used for counseling as well as treatment plan and follow-up. The patient was provided an opportunity to ask questions and all were answered. The patient agreed with the plan and demonstrated an understanding of the instructions.  LOIS Wilkie Mcgee , MD    CC: Gabino Boga, MD

## 2024-02-29 NOTE — Congregational Nurse Program (Signed)
 Accompanied patient to [facility] for appointment with Dr. Shila.  Only complaint was continued lower abdominal pain for approximately 3 months. She stated tramadol  helps with rectal pain.  Patient asked about possible surgery to remove tumor.  Dr. Shila will do a colonoscopy on 04/08/2024 and communicate with Dr. Lanny regarding referral to surgeon.  Doctor ordered steroidal suppositories which CN picked up from [facility] pharmacy.  Jillian Lutz, Congregational Nurse [phone]

## 2024-03-01 ENCOUNTER — Other Ambulatory Visit (HOSPITAL_COMMUNITY): Payer: Self-pay

## 2024-03-01 ENCOUNTER — Encounter: Payer: Self-pay | Admitting: Hematology

## 2024-03-03 ENCOUNTER — Other Ambulatory Visit: Payer: Self-pay

## 2024-03-03 ENCOUNTER — Other Ambulatory Visit (HOSPITAL_COMMUNITY): Payer: Self-pay

## 2024-03-05 ENCOUNTER — Other Ambulatory Visit: Payer: Self-pay | Admitting: Pharmacy Technician

## 2024-03-05 ENCOUNTER — Other Ambulatory Visit (HOSPITAL_COMMUNITY): Payer: Self-pay

## 2024-03-05 ENCOUNTER — Other Ambulatory Visit: Payer: Self-pay

## 2024-03-05 ENCOUNTER — Encounter: Payer: Self-pay | Admitting: Hematology

## 2024-03-05 NOTE — Progress Notes (Signed)
 Specialty Pharmacy Refill Coordination Note  Jillian Lutz is a 73 y.o. female contacted today regarding refills of specialty medication(s) Capecitabine  (XELODA )   Patient requested Delivery   Delivery date: 03/14/24   Verified address: [address]   [place]  [id number]   Medication will be filled on 03/13/24.  Spoke to [employer]: Engineer, water.

## 2024-03-09 ENCOUNTER — Other Ambulatory Visit: Payer: Self-pay

## 2024-03-10 ENCOUNTER — Ambulatory Visit

## 2024-03-10 ENCOUNTER — Ambulatory Visit: Admitting: Hematology

## 2024-03-10 ENCOUNTER — Telehealth: Payer: Self-pay | Admitting: *Deleted

## 2024-03-10 ENCOUNTER — Other Ambulatory Visit

## 2024-03-10 NOTE — Telephone Encounter (Signed)
  Jillian Lutz 02-24-1951 [id number]  03/10/2024   Dear Dr Lanny:  We have scheduled the above named patient for a(n) Colonoscopy procedure. Our records show that (s)he is on anticoagulation therapy.  Please advise as to whether the patient may come off their therapy of Xarelto  2 days prior to their procedure which is scheduled for 04/01/2024.  Please route your response to Grayce Loge, CMA or fax response to [phone].  Sincerely,    [facility]

## 2024-03-13 ENCOUNTER — Encounter: Payer: Self-pay | Admitting: Hematology

## 2024-03-13 ENCOUNTER — Other Ambulatory Visit: Payer: Self-pay

## 2024-03-13 ENCOUNTER — Other Ambulatory Visit (HOSPITAL_COMMUNITY): Payer: Self-pay

## 2024-03-13 NOTE — Telephone Encounter (Signed)
 Yes, ok to hold before endoscopy, thx   Jillian Lutz  Tried to contact Interpreter line and they do not have the language of Jillian Lutz . I  have someone from [facility] Interpreter services returning my call.  We need to give the patient instructions on holding her blood thinner

## 2024-03-13 NOTE — Telephone Encounter (Signed)
 Keo the [employer] [employer] interpreter for [employer] has spoken to Keani and informed her to hold the Xarelto  03/30/24 and 03/31/24. His # is [phone].

## 2024-03-14 NOTE — Telephone Encounter (Signed)
 Noted. Thank You.

## 2024-03-14 NOTE — Congregational Nurse Program (Signed)
 Home visit.  Patient received Xeloda  today.  She completed last round yesterday Thursday 03/13/24.  Instructed her to start next round in 1 week (03/20/24).  Reminded her of appointment with [facility] on 03/19/2024 @ 9:45 am.  When trying to schedule Medicaid transportation yesterday DSS rep stated her Medicaid expires on 03/16/2024 and she needs to provide proof of income for 12/2023.  However patient received a letter dated 02/21/2024 that she is eligible for Medicaid thru [employer] as long as she receives SSI.  CN took copies of pay records for June, July, and August 2025 to DSS.  Will request transportation to 03/19/2024 appointment through [facility].  [name] RN, Congregational Nurse [phone]

## 2024-03-17 ENCOUNTER — Encounter: Payer: Self-pay | Admitting: Hematology

## 2024-03-18 DIAGNOSIS — C20 Malignant neoplasm of rectum: Secondary | ICD-10-CM | POA: Diagnosis not present

## 2024-03-18 NOTE — Assessment & Plan Note (Signed)
 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 -Despite strong recommendation, patient firmly and repeatedly declined IV chemo and surgery. She agreed to chemoRT with Xeloda , and received the treatment 01/04/21 - 02/14/21. -she developed local cancer progression and lung mets in 11/2021 -she started Xeloda  on 02/16/2022, low dose oxaliplatin  was added on 05/11/22 (with cycle 4 of Xeloda )  -chemo held since 07/2022 due to leg pain and recurrent nausea  -restaging CT 09/07/2022 showed stable disease in rectum and lung, no new lesions.   -she had chemo break from Feb-May 2024 -restart CAPOX with dose reduction 12/18/2022, not tolerating very well overall.  Oxaliplatin  was held on August 5. She is on oxaliplatin  every 6 weeks now.  -Restaging CT scan from 10/09/2023 showed stable rectal wall thickening, no other  evidence of metastasis  - Restaging CT scan from January 21, 2024 showed diminished circumferential wall thickening of a low rectal mass, no evidence of distant metastasis.

## 2024-03-18 NOTE — Assessment & Plan Note (Signed)
-  Doppler on July 13, 2022 showed left acute DVT involving popliteal and peroneal vein, CTA chest was negative for PE. -continue Xarelto, tolerating well

## 2024-03-19 ENCOUNTER — Inpatient Hospital Stay (HOSPITAL_BASED_OUTPATIENT_CLINIC_OR_DEPARTMENT_OTHER): Payer: Self-pay | Admitting: Hematology

## 2024-03-19 ENCOUNTER — Encounter: Payer: Self-pay | Admitting: Hematology

## 2024-03-19 ENCOUNTER — Other Ambulatory Visit: Payer: Self-pay | Admitting: Pharmacist

## 2024-03-19 ENCOUNTER — Inpatient Hospital Stay: Payer: Self-pay | Admitting: Dietician

## 2024-03-19 ENCOUNTER — Other Ambulatory Visit (HOSPITAL_COMMUNITY): Payer: Self-pay

## 2024-03-19 ENCOUNTER — Inpatient Hospital Stay: Payer: Self-pay

## 2024-03-19 ENCOUNTER — Inpatient Hospital Stay: Payer: Self-pay | Attending: Physician Assistant

## 2024-03-19 VITALS — BP 122/70 | HR 80 | Temp 98.2°F | Resp 15 | Ht <= 58 in | Wt 110.8 lb

## 2024-03-19 DIAGNOSIS — Z9221 Personal history of antineoplastic chemotherapy: Secondary | ICD-10-CM | POA: Diagnosis not present

## 2024-03-19 DIAGNOSIS — C20 Malignant neoplasm of rectum: Secondary | ICD-10-CM | POA: Diagnosis not present

## 2024-03-19 DIAGNOSIS — Z86718 Personal history of other venous thrombosis and embolism: Secondary | ICD-10-CM | POA: Diagnosis not present

## 2024-03-19 DIAGNOSIS — G893 Neoplasm related pain (acute) (chronic): Secondary | ICD-10-CM

## 2024-03-19 DIAGNOSIS — Z7901 Long term (current) use of anticoagulants: Secondary | ICD-10-CM

## 2024-03-19 DIAGNOSIS — D6481 Anemia due to antineoplastic chemotherapy: Secondary | ICD-10-CM

## 2024-03-19 DIAGNOSIS — K6289 Other specified diseases of anus and rectum: Secondary | ICD-10-CM

## 2024-03-19 DIAGNOSIS — Z923 Personal history of irradiation: Secondary | ICD-10-CM | POA: Diagnosis not present

## 2024-03-19 DIAGNOSIS — Z5111 Encounter for antineoplastic chemotherapy: Secondary | ICD-10-CM | POA: Insufficient documentation

## 2024-03-19 DIAGNOSIS — I82432 Acute embolism and thrombosis of left popliteal vein: Secondary | ICD-10-CM

## 2024-03-19 DIAGNOSIS — C7800 Secondary malignant neoplasm of unspecified lung: Secondary | ICD-10-CM | POA: Diagnosis not present

## 2024-03-19 LAB — CBC WITH DIFFERENTIAL (CANCER CENTER ONLY)
Abs Immature Granulocytes: 0.03 K/uL (ref 0.00–0.07)
Basophils Absolute: 0 K/uL (ref 0.0–0.1)
Basophils Relative: 1 %
Eosinophils Absolute: 0.1 K/uL (ref 0.0–0.5)
Eosinophils Relative: 3 %
HCT: 27.1 % — ABNORMAL LOW (ref 36.0–46.0)
Hemoglobin: 8.9 g/dL — ABNORMAL LOW (ref 12.0–15.0)
Immature Granulocytes: 1 %
Lymphocytes Relative: 23 %
Lymphs Abs: 1.1 K/uL (ref 0.7–4.0)
MCH: 26.6 pg (ref 26.0–34.0)
MCHC: 32.8 g/dL (ref 30.0–36.0)
MCV: 81.1 fL (ref 80.0–100.0)
Monocytes Absolute: 0.6 K/uL (ref 0.1–1.0)
Monocytes Relative: 14 %
Neutro Abs: 2.7 K/uL (ref 1.7–7.7)
Neutrophils Relative %: 58 %
Platelet Count: 202 K/uL (ref 150–400)
RBC: 3.34 MIL/uL — ABNORMAL LOW (ref 3.87–5.11)
RDW: 19.7 % — ABNORMAL HIGH (ref 11.5–15.5)
WBC Count: 4.6 K/uL (ref 4.0–10.5)
nRBC: 0 % (ref 0.0–0.2)

## 2024-03-19 LAB — TOTAL PROTEIN, URINE DIPSTICK: Protein, ur: NEGATIVE mg/dL

## 2024-03-19 LAB — CEA (ACCESS): CEA (CHCC): 27.94 ng/mL — ABNORMAL HIGH (ref 0.00–5.00)

## 2024-03-19 MED ORDER — TRAMADOL HCL 50 MG PO TABS
50.0000 mg | ORAL_TABLET | Freq: Two times a day (BID) | ORAL | 0 refills | Status: DC | PRN
  Filled 2024-03-19 – 2024-03-26 (×2): qty 60, 30d supply, fill #0

## 2024-03-19 MED ORDER — SODIUM CHLORIDE 0.9 % IV SOLN
7.5000 mg/kg | Freq: Once | INTRAVENOUS | Status: AC
  Administered 2024-03-19: 350 mg via INTRAVENOUS
  Filled 2024-03-19: qty 14

## 2024-03-19 MED ORDER — SODIUM CHLORIDE 0.9 % IV SOLN: Freq: Once | INTRAVENOUS | Status: AC

## 2024-03-19 NOTE — Progress Notes (Signed)
 [facility]   Telephone:([phone] Fax:([phone]   Clinic Follow up Note   Patient Care Team: [name], DO as PCP - General (Internal Medicine) [name], NP as Nurse Practitioner (Nurse Practitioner) [name], MD as Consulting Physician (Hematology and Oncology) [name], MD as Consulting Physician (Radiation Oncology) [name], [name], MD as Consulting Physician (Gastroenterology) [name], DO as Consulting Physician (Pulmonary Disease) [name], MD as Consulting Physician (General Surgery)  Date of Service:  03/19/2024  CHIEF COMPLAINT: f/u of metastatic rectal cancer  CURRENT THERAPY:  CapeOx and bevacizumab   Oncology History   Left leg DVT (HCC) -Doppler on July 13, 2022 showed left acute DVT involving popliteal and peroneal vein, CTA chest was negative for PE. -continue Xarelto , tolerating well    Rectal adenocarcinoma (HCC) cT3cN1M0 stage IIIb, biopsy confirmed residual primary tumor and oligo lung met in 09/2021 -Initially diagnosed 11/2020 by colonoscopy for progressive rectal pain and bleeding, weight loss, and constipation. -Despite strong recommendation, patient firmly and repeatedly declined IV chemo and surgery. She agreed to chemoRT with Xeloda , and received the treatment 01/04/21 - 02/14/21. -she developed local cancer progression and lung mets in 11/2021 -she started Xeloda  on 02/16/2022, low dose oxaliplatin  was added on 05/11/22 (with cycle 4 of Xeloda )  -chemo held since 07/2022 due to leg pain and recurrent nausea  -restaging CT 09/07/2022 showed stable disease in rectum and lung, no new lesions.   -she had chemo break from Feb-May 2024 -restart CAPOX with dose reduction 12/18/2022, not tolerating very well overall.  Oxaliplatin  was held on August 5. She is on oxaliplatin  every 6 weeks now.  -Restaging CT scan from 10/09/2023 showed stable rectal wall thickening, no other  evidence of metastasis  - Restaging  CT scan from January 21, 2024 showed diminished circumferential wall thickening of a low rectal mass, no evidence of distant metastasis.  Assessment & Plan Metastatic rectal cancer Metastatic rectal cancer with recent tumor reduction on July scan. No recent rectal bleeding. 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 No transfusion needed today.  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 - Call in tramadol  prescription with two refills to [facility] - Continue tramadol , one tablet in the morning and one at night  Plan - She is clinically stable, no overt GI bleeding recently. - Lab reviewed, adequate for treatment, will proceed bevacizumab  today - She will continue capecitabine  at home - I refilled her tramadol  - She is scheduled for sigmoidoscopy on September 16 - Follow-up in 3 weeks before next cycle CapeOx and beva    SUMMARY OF ONCOLOGIC HISTORY: Oncology History Overview Note  Cancer Staging Rectal adenocarcinoma [facility]) Staging form: Colon and Rectum, AJCC 8th Edition - Clinical stage from 12/21/2020: Stage IIIB (cT3, cN1, cM0) - Unsigned    Rectal adenocarcinoma (HCC)  08/26/2020 Miscellaneous   Initial presentation to PCP, reporting intermittent BRBPR since 02/2020    12/01/2020 Procedure   Colonoscopy by Dr. Shila findings - The perianal and digital rectal examinations were normal. - A 15 mm polyp was found in the ascending colon. The polyp was semi-pedunculated. Resection and retrieval were complete. - A 25 mm polyp was found in the sigmoid colon.  The polyp was pedunculated. Resection and retrieval were complete. - An infiltrative partially obstructing  large mass was found in the proximal rectum. The mass was partially circumferential (involving one-half of the lumen circumference). The mass measured eight cm in length extending from 10 to 18cm from anal verge. This was biopsied with a cold forceps for histology. Proximal and distal opposite fold area of the mass lesion was tattooed with an injection of total 3 mL of Spot (carbon black).   12/01/2020 Initial Biopsy   Diagnosis 1. Colon, polyp(s), ascending x 1 - ADENOCARCINOMA ARISING IN A TUBULAR ADENOMA WITH HIGH-GRADE DYSPLASIA. SEE NOTE 2. Colon, sigmoid polyp, x1 - TUBULOVILLOUS ADENOMA(S) - NEGATIVE FOR HIGH-GRADE DYSPLASIA OR MALIGNANCY 3. Rectum, biopsy - ADENOCARCINOMA. SEE NOTE   12/01/2020 Cancer Staging   Cancer Staging Rectal adenocarcinoma [facility]) Staging form: Colon and Rectum, AJCC 8th Edition - Clinical stage from 12/21/2020: Stage IIIB (cT3, cN1, cM0) - Unsigned    12/06/2020 Imaging   CT CAP with contrast IMPRESSION: 1. There is partially circumferential soft tissue thickening of the mid to superior rectum, approximately 5 cm in length and the inferior extent approximately 6 cm above the anal verge, consistent with primary rectal malignancy identified by colonoscopy. 2. There appear to be abnormally enlarged perirectal lymph nodes posteriorly about the superior rectum measuring up to 1.0 x 0.8 cm. Findings are suspicious for perirectal nodal metastatic disease, however rectal MRI is the test of choice for initial local staging of rectal cancer. 3. There is a 4 mm nonspecific pulmonary nodule of the superior segment left lower lobe, statistically most likely incidental, infectious or inflammatory, although nonspecific and isolated metastatic disease is not strictly excluded. Attention on follow-up. 4. No other evidence of metastatic disease in the chest, abdomen, or pelvis. Aortic Atherosclerosis (ICD10-I70.0).   12/09/2020 Imaging   Local staging MRI pelvis without  contrast IMPRESSION: 4.9 cm circumferential mid/lower rectal mass, corresponding to the patient's newly diagnosed rectal cancer. Rectal adenocarcinoma T stage: T3c Rectal adenocarcinoma N stage:  N1 Distance from tumor to the internal anal sphincter is 3.7 cm.   12/21/2020 Initial Diagnosis   Rectal adenocarcinoma (HCC)   01/04/2021 -  Chemotherapy   Concurrent chemoradiation with Xeloda  1000mg  in the AM and 1500mg  in the PM on days of Radiation starting 01/04/21.       01/04/2021 - 02/11/2021 Radiation Therapy   Concurrent chemoradiation with Dr Dewey and Xeloda  starting 01/04/21.    01/31/2022 Procedure   Colonoscopy, Dr. Shila  Findings: - An infiltrative partially obstructing large mass was found in the rectum. The mass was circumferential. The mass measured ten cm in length, extending from 5-15cm from anal verge. In addition, rectal diameter at narrowest approx twelve mm. Oozing was present. Biopsies were taken with a cold forceps for histology.  Impression: - Hemorrhoids found on perianal exam. - Likely malignant partially obstructing tumor in the rectum. Biopsied. - Non-bleeding external and internal hemorrhoids.   01/31/2022 Pathology Results   Diagnosis Rectum, biopsy INVASIVE MODERATELY DIFFERENTIATED ADENOCARCINOMA   05/11/2022 -  Chemotherapy   Patient is on Treatment Plan : COLORECTAL Xelox (Capeox)(130/850) q21d     09/07/2022 Imaging    IMPRESSION: 1. No significant change in circumferential wall thickening of the rectum, in keeping with known primary rectal mass. Similar appearance of post treatment perirectal and presacral fat stranding and fascial thickening 2. Unchanged nodule of the superior segment left lower lobe with adjacent bandlike scarring. No new nodules. 3. No evidence of lymphadenopathy or other  metastatic disease in the chest, abdomen, or pelvis. 4. Large burden of stool throughout the colon. 5. Cardiomegaly.   12/08/2022 Imaging     IMPRESSION: Persistent wall thickening along the rectum with adjacent severe inflammatory stranding and nodularity. Please correlate with exact location of the distribution of the patient's neoplasm.   No discrete new mass lesion, fluid collection or lymph node enlargement.   Improved visualization of the fractures involving the left side of the pubic symphysis as well as probable insufficiency fractures along the sacrum. Associated areas of increasing heterogeneous bony sclerosis along the pubic bones and sacrum. In principle sclerotic bone metastases would be in the differential but are felt to be less likely based on the overall appearance. If needed confirmatory MRI can be considered as clinically appropriate to further delineate.   Stable scarring and fibrotic changes along the lungs with a tiny nodular area along the superior segment of the left lower lobe. Simple continued follow up.   Enlarged heart.   10/09/2023 Imaging   CT chest abdomen and pelvis with contrast  MPRESSION: 1. Persistent circumferential wall thickening in the rectum, not substantially changed taking into account less rectal distention on the current study. 2. Marked stool volume throughout the colon proximal to the rectum, similar to minimally progressive in the interval. While this may reflect clinical constipation, a degree of stricture related to the rectal neoplasm cannot be excluded. 3. No findings on the current study to suggest progressive or metastatic disease. 4. Heterogeneous mineralization of the sacrum, as before. Nonspecific by CT, features may reflect old trauma.      Discussed the use of AI scribe software for clinical note transcription with the patient, who gave verbal consent to proceed.  History of Present Illness Jillian Lutz is a 73 year old female with metastatic rectal cancer who presents for follow-up.  She is undergoing chemotherapy with bevacizumab  every other cycle and  capecitabine , taken as two tablets twice daily for two weeks followed by a week off. She has one more refill of capecitabine  and is awaiting the next cycle. Pain is managed with tramadol , taken one tablet in the morning and one at night, and she requires a refill. Hemoglobin level is 8.9, consistent with previous results. No recent rectal bleeding.     All other systems were reviewed with the patient and are negative.  MEDICAL HISTORY:  Past Medical History:  Diagnosis Date   Bilateral wrist pain 10/25/2017   Hypertension    Left leg DVT (deep venous thrombosis) (HCC) 06/2022   Neck mass 1998   Unknown biopsy results. Excised in [place].   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   Rectal cancer (HCC)    Trigger finger, acquired 02/08/2017    SURGICAL HISTORY: Past Surgical History:  Procedure Laterality Date   BRONCHIAL BIOPSY  12/13/2021   Procedure: BRONCHIAL BIOPSIES;  Surgeon: [name], DO;  Location: [facility];  Service: Pulmonary;;   BRONCHIAL NEEDLE ASPIRATION BIOPSY  12/13/2021   Procedure: BRONCHIAL NEEDLE ASPIRATION BIOPSIES;  Surgeon: [name], DO;  Location: [facility];  Service: Pulmonary;;   IR IMAGING GUIDED PORT INSERTION  09/17/2023   NECK SURGERY Left 1998   Excision of mass in [place]   VIDEO BRONCHOSCOPY WITH RADIAL ENDOBRONCHIAL ULTRASOUND  12/13/2021   Procedure: RADIAL ENDOBRONCHIAL ULTRASOUND;  Surgeon: [name], DO;  Location: [facility];  Service: Pulmonary;;    I have reviewed the social history and family history with the  patient and they are unchanged from previous note.  ALLERGIES:  is allergic to no known allergies.  MEDICATIONS:  Current Outpatient Medications  Medication Sig Dispense Refill   amLODipine  (NORVASC ) 5 MG tablet Take 1 tablet (5 mg total) by mouth daily. 90 tablet 3   brimonidine  (ALPHAGAN ) 0.2 % ophthalmic solution Place 1 drop into the right eye 2 (two) times daily. 10 mL  11   capecitabine  (XELODA ) 500 MG tablet Take 2 tabs every 12 hours, for 14 days then off for 7 days. Take 30mins after meal 56 tablet 2   dexamethasone  (DECADRON ) 4 MG tablet Take 1 tablet (4 mg total) by mouth daily. TAKE DAILY FOR 3-5 DAYS AFTER IV CHEMO 20 tablet 1   diclofenac  Sodium (VOLTAREN ) 1 % GEL Apply 4 grams topically 4 (four) times daily. 100 g 3   dorzolamide -timolol  (COSOPT ) 2-0.5 % ophthalmic solution Instill 1 drop into both eyes twice a day 10 mL 11   gabapentin  (NEURONTIN ) 100 MG capsule Take 1 capsule (100 mg total) by mouth 3 (three) times daily as needed. 90 capsule 2   hydrocortisone  (ANUSOL -HC) 25 MG suppository Place 1 suppository (25 mg total) rectally at bedtime. 12 suppository 1   lidocaine  (XYLOCAINE ) 5 % ointment Apply to anal area daily as needed. 50 g 1   lidocaine -prilocaine  (EMLA ) cream Apply topically daily as needed. 30 g 3   lisinopril -hydrochlorothiazide  (ZESTORETIC ) 20-12.5 MG tablet Take 2 tablets by mouth daily. 60 tablet 3   mirtazapine  (REMERON ) 30 MG tablet Take 1 tablet (30 mg total) by mouth at bedtime. 30 tablet 3   Multiple Vitamins-Minerals (MULTIVITAMIN WITH MINERALS) tablet Take 1 tablet by mouth daily. Gummy 50 mg     ondansetron  (ZOFRAN ) 8 MG tablet Take 1 tablet (8 mg total) by mouth every 8 (eight) hours as needed for nausea or vomiting. Take after 3 days of chemo 30 tablet 2   polyethylene glycol powder (GLYCOLAX/MIRALAX) 17 GM/SCOOP powder Take 1 Container by mouth once.     potassium chloride  (KLOR-CON  M) 10 MEQ tablet Take 1 tablet (10 mEq total) by mouth 2 (two) times daily. 60 tablet 3   prochlorperazine  (COMPAZINE ) 10 MG tablet Take 1 tablet (10 mg total) by mouth every 6 (six) hours as needed. 30 tablet 2   rivaroxaban  (XARELTO ) 20 MG TABS tablet Take 1 tablet (20 mg total) by mouth daily with supper. 30 tablet 2   traMADol  (ULTRAM ) 50 MG tablet Take 1 tablet (50 mg total) by mouth every 12 (twelve) hours as needed. 60 tablet 0   urea   (CARMOL) 10 % cream Apply topically 2 times daily as needed. 85 g 3   No current facility-administered medications for this visit.    PHYSICAL EXAMINATION: ECOG PERFORMANCE STATUS: 1 - Symptomatic but completely ambulatory  Vitals:   03/19/24 1016  BP: 122/70  Pulse: 80  Resp: 15  Temp: 98.2 F (36.8 C)  SpO2: 98%   Wt Readings from Last 3 Encounters:  03/19/24 110 lb 12.8 oz (50.3 kg)  02/29/24 106 lb 2 oz (48.1 kg)  02/27/24 106 lb 14.4 oz (48.5 kg)     GENERAL:alert, no distress and comfortable SKIN: skin color, texture, turgor are normal, no rashes or significant lesions EYES: normal, Conjunctiva are pink and non-injected, sclera clear NECK: supple, thyroid normal size, non-tender, without nodularity LYMPH:  no palpable lymphadenopathy in the cervical, axillary  LUNGS: clear to auscultation and percussion with normal breathing effort HEART: regular rate & rhythm and no murmurs  and no lower extremity edema ABDOMEN:abdomen soft, non-tender and normal bowel sounds Musculoskeletal:no cyanosis of digits and no clubbing  NEURO: alert & oriented x 3 with fluent speech, no focal motor/sensory deficits  Physical Exam    LABORATORY DATA:  I have reviewed the data as listed    Latest Ref Rng & Units 03/19/2024    9:58 AM 02/27/2024    9:43 AM 02/18/2024   10:44 AM  CBC  WBC 4.0 - 10.5 K/uL 4.6  6.3  6.4   Hemoglobin 12.0 - 15.0 g/dL 8.9  8.8  9.7   Hematocrit 36.0 - 46.0 % 27.1  26.3  29.0   Platelets 150 - 400 K/uL 202  249  231         Latest Ref Rng & Units 02/27/2024    9:43 AM 02/18/2024   10:44 AM 01/28/2024   11:15 AM  CMP  Glucose 70 - 99 mg/dL 809  890  87   BUN 8 - 23 mg/dL 23  16  18    Creatinine 0.44 - 1.00 mg/dL 9.14  9.19  9.20   Sodium 135 - 145 mmol/L 136  140  138   Potassium 3.5 - 5.1 mmol/L 3.1  3.4  3.4   Chloride 98 - 111 mmol/L 101  104  105   CO2 22 - 32 mmol/L 31  32  30   Calcium 8.9 - 10.3 mg/dL 8.5  8.6  8.5   Total Protein 6.5 - 8.1 g/dL  6.7  6.9  6.5   Total Bilirubin 0.0 - 1.2 mg/dL 0.4  0.4  0.5   Alkaline Phos 38 - 126 U/L 53  53  52   AST 15 - 41 U/L 13  14  16    ALT 0 - 44 U/L <5  <5  5       RADIOGRAPHIC STUDIES: I have personally reviewed the radiological images as listed and agreed with the findings in the report. No results found.    Orders Placed This Encounter  Procedures   CBC with Differential (Cancer Center Only)    Standing Status:   Future    Expected Date:   06/30/2024    Expiration Date:   06/30/2025   CMP (Cancer Center only)    Standing Status:   Future    Expected Date:   06/30/2024    Expiration Date:   06/30/2025   CBC with Differential (Cancer Center Only)    Standing Status:   Future    Expected Date:   07/21/2024    Expiration Date:   07/21/2025   All questions were answered. The patient knows to call the clinic with any problems, questions or concerns. No barriers to learning was detected. The total time spent in the appointment was 30 minutes, including review of chart and various tests results, discussions about plan of care and coordination of care plan     Onita Mattock, MD 03/19/2024

## 2024-03-19 NOTE — Patient Instructions (Addendum)
 [facility] - A DEPT OF . [facility]  Discharge Instructions: Thank you for choosing [facility] to provide your oncology and hematology care.   If you have a lab appointment with the Cancer Center, please go directly to the Cancer Center and check in at the registration area.   Wear comfortable clothing and clothing appropriate for easy access to any Portacath or PICC line.   We strive to give you quality time with your provider. You may need to reschedule your appointment if you arrive late (15 or more minutes).  Arriving late affects you and other patients whose appointments are after yours.  Also, if you miss three or more appointments without notifying the office, you may be dismissed from the clinic at the provider's discretion.      For prescription refill requests, have your pharmacy contact our office and allow 72 hours for refills to be completed.    Today you received the following chemotherapy and/or immunotherapy agents: Bevacizumab  (Avastin )    To help prevent nausea and vomiting after your treatment, we encourage you to take your nausea medication as directed.  BELOW ARE SYMPTOMS THAT SHOULD BE REPORTED IMMEDIATELY: *FEVER GREATER THAN 100.4 F (38 C) OR HIGHER *CHILLS OR SWEATING *NAUSEA AND VOMITING THAT IS NOT CONTROLLED WITH YOUR NAUSEA MEDICATION *UNUSUAL SHORTNESS OF BREATH *UNUSUAL BRUISING OR BLEEDING *URINARY PROBLEMS (pain or burning when urinating, or frequent urination) *BOWEL PROBLEMS (unusual diarrhea, constipation, pain near the anus) TENDERNESS IN MOUTH AND THROAT WITH OR WITHOUT PRESENCE OF ULCERS (sore throat, sores in mouth, or a toothache) UNUSUAL RASH, SWELLING OR PAIN  UNUSUAL VAGINAL DISCHARGE OR ITCHING   Items with * indicate a potential emergency and should be followed up as soon as possible or go to the Emergency Department if any problems should occur.  Please show the CHEMOTHERAPY ALERT CARD or  IMMUNOTHERAPY ALERT CARD at check-in to the Emergency Department and triage nurse.  Should you have questions after your visit or need to cancel or reschedule your appointment, please contact [facility] - A DEPT OF [facility]  Dept: [phone]  and follow the prompts.  Office hours are 8:00 a.m. to 4:30 p.m. Monday - Friday. Please note that voicemails left after 4:00 p.m. may not be returned until the following business day.  We are closed weekends and major holidays. You have access to a nurse at all times for urgent questions. Please call the main number to the clinic Dept: [phone] and follow the prompts.   For any non-urgent questions, you may also contact your provider using MyChart. We now offer e-Visits for anyone 65 and older to request care online for non-urgent symptoms. For details visit [email or web address].   Also download the MyChart app! Go to the app store, search MyChart, open the app, select [place], and log in with your MyChart username and password.

## 2024-03-19 NOTE — Progress Notes (Signed)
 Nutrition Follow-up:  Patient with rectal cancer. Patient is on bevacizumab  and oxaliplatin .   Met with patient in infusion. Interpretor present at visit. She reports appetite has been better recently. She is drinking Boost (2-4) daily. Patient is pleased with wt gain. She says she is almost out of these as well as diapers. Requested RD to inform congressional RN Marletta) of her needs. Patient denies nausea, vomiting. Having regular bowel movements.    Medications: reviewed   Labs: reviewed   Anthropometrics: Wt 110 lb 12.8 oz - trending up  8/15 - 106 lb 2 oz  7/31 - 105 lb 12.8 oz   NUTRITION DIAGNOSIS: Food and nutrition related knowledge deficit improved    INTERVENTION:  Continue drinking Boost Plus/equivalent 3/day for wt maintenance/gain Message sent via in-basket to Lexicographer regarding pt needs ONS covered through medicaid via [employer]    MONITORING, EVALUATION, GOAL: wt trends, intake   NEXT VISIT: To be scheduled as needed

## 2024-03-21 ENCOUNTER — Other Ambulatory Visit: Payer: Self-pay

## 2024-03-21 ENCOUNTER — Telehealth: Payer: Self-pay

## 2024-03-21 ENCOUNTER — Encounter: Payer: Self-pay | Admitting: Hematology

## 2024-03-21 NOTE — Telephone Encounter (Signed)
 Scheduled transportation for the following 2 appointments: 1) [facility] 04/01/24 @ 2:30 pm   2) [facility] 04/08/24 at 10:30 am.  [name] RN, Congregational Nurse [phone]

## 2024-03-24 ENCOUNTER — Encounter: Payer: Self-pay | Admitting: Hematology

## 2024-03-25 ENCOUNTER — Other Ambulatory Visit: Payer: Self-pay

## 2024-03-26 ENCOUNTER — Other Ambulatory Visit: Payer: Self-pay

## 2024-03-26 ENCOUNTER — Other Ambulatory Visit (HOSPITAL_COMMUNITY): Payer: Self-pay

## 2024-03-26 ENCOUNTER — Encounter: Payer: Self-pay | Admitting: Hematology

## 2024-03-26 NOTE — Congregational Nurse Program (Signed)
 Home visit with interpreter Jillian Lutz.  We reviewed instructions and demonstrated how to mix prep for colonoscopy scheduled for 04/01/2024.  Told her to stop xarelto  after 03/29/2024.  Patient only has 3 xarelto  tablets left in bottle. Phone call to [facility]) who stated it was delivered on 03/24/2024.  Patient was initially unable to find it and stated she did not remember receiving it but eventually located it.  She stated she did not start Xeloda  until yesterday because she misread start date provided by CN , which was written as 9/4 but she interpreted it as 9/9.  Transportation has been requested for 9/16 appointment with a scheduled pick-up time at 1:45 pm.  [name] RN, Congregational Nurse [phone]

## 2024-03-27 ENCOUNTER — Other Ambulatory Visit: Payer: Self-pay

## 2024-03-31 ENCOUNTER — Ambulatory Visit

## 2024-03-31 ENCOUNTER — Other Ambulatory Visit: Payer: Self-pay

## 2024-03-31 ENCOUNTER — Other Ambulatory Visit: Payer: Self-pay | Admitting: Pharmacy Technician

## 2024-03-31 ENCOUNTER — Other Ambulatory Visit

## 2024-03-31 ENCOUNTER — Ambulatory Visit: Admitting: Nurse Practitioner

## 2024-03-31 NOTE — Progress Notes (Signed)
 Specialty Pharmacy Refill Coordination Note  Jillian Lutz is a 73 y.o. female contacted today regarding refills of specialty medication(s) Capecitabine  (XELODA )  Spoke with [name], RN  Patient requested Delivery   Delivery date: 04/10/24   Verified address: [address] [place]   Medication will be filled on 04/09/24.

## 2024-04-01 ENCOUNTER — Encounter: Payer: Self-pay | Admitting: Gastroenterology

## 2024-04-01 ENCOUNTER — Ambulatory Visit (AMBULATORY_SURGERY_CENTER): Admitting: Gastroenterology

## 2024-04-01 VITALS — BP 138/81 | HR 71 | Temp 98.0°F | Resp 14 | Ht <= 58 in | Wt 106.0 lb

## 2024-04-01 DIAGNOSIS — K625 Hemorrhage of anus and rectum: Secondary | ICD-10-CM | POA: Diagnosis not present

## 2024-04-01 DIAGNOSIS — K6289 Other specified diseases of anus and rectum: Secondary | ICD-10-CM

## 2024-04-01 DIAGNOSIS — C20 Malignant neoplasm of rectum: Secondary | ICD-10-CM | POA: Diagnosis not present

## 2024-04-01 DIAGNOSIS — K56600 Partial intestinal obstruction, unspecified as to cause: Secondary | ICD-10-CM

## 2024-04-01 DIAGNOSIS — I10 Essential (primary) hypertension: Secondary | ICD-10-CM | POA: Diagnosis not present

## 2024-04-01 MED ORDER — SODIUM CHLORIDE 0.9 % IV SOLN: 500.0000 mL | INTRAVENOUS | Status: DC

## 2024-04-01 NOTE — Progress Notes (Unsigned)
 Pt's states no medical or surgical changes since previsit or office visit.

## 2024-04-01 NOTE — Patient Instructions (Addendum)
 Resume previous diet Continue present medications Await pathology results Follow up with oncology, consider palliative surgery (colostomy) to prevent colonic obstruction. Resume Xarelto  at prior dose today   YOU HAD AN ENDOSCOPIC PROCEDURE TODAY AT THE [facility]:   Refer to the procedure report that was given to you for any specific questions about what was found during the examination.  If the procedure report does not answer your questions, please call your gastroenterologist to clarify.  If you requested that your care partner not be given the details of your procedure findings, then the procedure report has been included in a sealed envelope for you to review at your convenience later.  YOU SHOULD EXPECT: Some feelings of bloating in the abdomen. Passage of more gas than usual.  Walking can help get rid of the air that was put into your GI tract during the procedure and reduce the bloating. If you had a lower endoscopy (such as a colonoscopy or flexible sigmoidoscopy) you may notice spotting of blood in your stool or on the toilet paper. If you underwent a bowel prep for your procedure, you may not have a normal bowel movement for a few days.  Please Note:  You might notice some irritation and congestion in your nose or some drainage.  This is from the oxygen used during your procedure.  There is no need for concern and it should clear up in a day or so.  SYMPTOMS TO REPORT IMMEDIATELY:  Following lower endoscopy (colonoscopy or flexible sigmoidoscopy):  Excessive amounts of blood in the stool  Significant tenderness or worsening of abdominal pains  Swelling of the abdomen that is new, acute  Fever of 100F or higher   For urgent or emergent issues, a gastroenterologist can be reached at any hour by calling [phone]. Do not use MyChart messaging for urgent concerns.    DIET:  We do recommend a small meal at first, but then you may proceed to your regular diet.   Drink plenty of fluids but you should avoid alcoholic beverages for 24 hours.  ACTIVITY:  You should plan to take it easy for the rest of today and you should NOT DRIVE or use heavy machinery until tomorrow (because of the sedation medicines used during the test).    FOLLOW UP: Our staff will call the number listed on your records the next business day following your procedure.  We will call around 7:15- 8:00 am to check on you and address any questions or concerns that you may have regarding the information given to you following your procedure. If we do not reach you, we will leave a message.     If any biopsies were taken you will be contacted by phone or by letter within the next 1-3 weeks.  Please call us  at [phone] if you have not heard about the biopsies in 3 weeks.    SIGNATURES/CONFIDENTIALITY: You and/or your care partner have signed paperwork which will be entered into your electronic medical record.  These signatures attest to the fact that that the information above on your After Visit Summary has been reviewed and is understood.  Full responsibility of the confidentiality of this discharge information lies with you and/or your care-partner.

## 2024-04-01 NOTE — Progress Notes (Unsigned)
 [facility] History and Physical   Primary Care Physician:  [name], DO   Reason for Procedure:  Rectal bleeding, rectal pain, rectal cancer  Plan:    colonoscopy with possible interventions as needed     HPI: Jillian Lutz is a very pleasant 73 y.o. female here for  colonoscopy  to assess the current state of the cancer and evaluate the colon to aid in surgical decision-making. H/o Rectal bleeding, rectal pain, rectal cancer.   The risks and benefits as well as alternatives of endoscopic procedure(s) have been discussed and reviewed. All questions answered. The patient agrees to proceed.    Past Medical History:  Diagnosis Date   Bilateral wrist pain 10/25/2017   Hypertension    Left leg DVT (deep venous thrombosis) (HCC) 06/2022   Neck mass 1998   Unknown biopsy results. Excised in [place].   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   Rectal cancer (HCC)    Trigger finger, acquired 02/08/2017    Past Surgical History:  Procedure Laterality Date   BRONCHIAL BIOPSY  12/13/2021   Procedure: BRONCHIAL BIOPSIES;  Surgeon: [name], DO;  Location: [facility];  Service: Pulmonary;;   BRONCHIAL NEEDLE ASPIRATION BIOPSY  12/13/2021   Procedure: BRONCHIAL NEEDLE ASPIRATION BIOPSIES;  Surgeon: [name], DO;  Location: [facility];  Service: Pulmonary;;   IR IMAGING GUIDED PORT INSERTION  09/17/2023   NECK SURGERY Left 1998   Excision of mass in [place]   VIDEO BRONCHOSCOPY WITH RADIAL ENDOBRONCHIAL ULTRASOUND  12/13/2021   Procedure: RADIAL ENDOBRONCHIAL ULTRASOUND;  Surgeon: [name], DO;  Location: [facility];  Service: Pulmonary;;    Prior to Admission medications   Medication Sig Start Date End Date Taking? Authorizing Provider  amLODipine  (NORVASC ) 5 MG tablet Take 1 tablet (5 mg total) by mouth daily. 02/27/24  Yes [name], MD  capecitabine  (XELODA ) 500 MG tablet Take 2 tabs every 12 hours, for 14  days then off for 7 days. Take after meal 02/18/24  Yes [name], MD  dexamethasone  (DECADRON ) 4 MG tablet Take 1 tablet (4 mg total) by mouth daily. TAKE DAILY FOR 3-5 DAYS AFTER IV CHEMO 09/04/23  Yes [name], MD  lisinopril -hydrochlorothiazide  (ZESTORETIC ) 20-12.5 MG tablet Take 2 tablets by mouth daily. 12/24/23  Yes Gabino Boga, MD  traMADol  (ULTRAM ) 50 MG tablet Take 1 tablet (50 mg total) by mouth every 12 (twelve) hours as needed. 03/19/24  Yes [name], MD  brimonidine  (ALPHAGAN ) 0.2 % ophthalmic solution Place 1 drop into the right eye 2 (two) times daily. Patient not taking: Reported on 04/01/2024 08/23/22     diclofenac  Sodium (VOLTAREN ) 1 % GEL Apply 4 grams topically 4 (four) times daily. 12/06/23   Stephanie Freund, MD  dorzolamide -timolol  (COSOPT ) 2-0.5 % ophthalmic solution Instill 1 drop into both eyes twice a day 08/23/22     gabapentin  (NEURONTIN ) 100 MG capsule Take 1 capsule (100 mg total) by mouth 3 (three) times daily as needed. Patient not taking: Reported on 04/01/2024 02/27/24   [name], MD  hydrocortisone  (ANUSOL -HC) 25 MG suppository Place 1 suppository (25 mg total) rectally at bedtime. 02/29/24   Jannice Beitzel V, MD  lidocaine  (XYLOCAINE ) 5 % ointment Apply to anal area daily as needed. 06/05/23   [name], MD  lidocaine -prilocaine  (EMLA ) cream Apply topically daily as needed. 10/16/23   Hanford Powell BRAVO, NP  mirtazapine  (REMERON ) 30 MG tablet Take 1 tablet (30 mg total) by  mouth at bedtime. 02/27/24   [name], MD  Multiple Vitamins-Minerals (MULTIVITAMIN WITH MINERALS) tablet Take 1 tablet by mouth daily. Gummy 50 mg    [provider]  ondansetron  (ZOFRAN ) 8 MG tablet Take 1 tablet (8 mg total) by mouth every 8 (eight) hours as needed for nausea or vomiting. Take after 3 days of chemo 12/24/23   [name], MD  polyethylene glycol powder (GLYCOLAX/MIRALAX) 17 GM/SCOOP powder Take 1 Container by mouth once.    [provider]  potassium chloride   (KLOR-CON  M) 10 MEQ tablet Take 1 tablet (10 mEq total) by mouth 2 (two) times daily. 02/27/24   [name], MD  prochlorperazine  (COMPAZINE ) 10 MG tablet Take 1 tablet (10 mg total) by mouth every 6 (six) hours as needed. 02/18/24   [name], MD  rivaroxaban  (XARELTO ) 20 MG TABS tablet Take 1 tablet (20 mg total) by mouth daily with supper. 02/27/24   [name], MD  urea  (CARMOL) 10 % cream Apply topically 2 times daily as needed. 06/22/22   Burton, [name] K, NP    Current Outpatient Medications  Medication Sig Dispense Refill   amLODipine  (NORVASC ) 5 MG tablet Take 1 tablet (5 mg total) by mouth daily. 90 tablet 3   capecitabine  (XELODA ) 500 MG tablet Take 2 tabs every 12 hours, for 14 days then off for 7 days. Take 30mins after meal 56 tablet 2   dexamethasone  (DECADRON ) 4 MG tablet Take 1 tablet (4 mg total) by mouth daily. TAKE DAILY FOR 3-5 DAYS AFTER IV CHEMO 20 tablet 1   lisinopril -hydrochlorothiazide  (ZESTORETIC ) 20-12.5 MG tablet Take 2 tablets by mouth daily. 60 tablet 3   traMADol  (ULTRAM ) 50 MG tablet Take 1 tablet (50 mg total) by mouth every 12 (twelve) hours as needed. 60 tablet 0   brimonidine  (ALPHAGAN ) 0.2 % ophthalmic solution Place 1 drop into the right eye 2 (two) times daily. (Patient not taking: Reported on 04/01/2024) 10 mL 11   diclofenac  Sodium (VOLTAREN ) 1 % GEL Apply 4 grams topically 4 (four) times daily. 100 g 3   dorzolamide -timolol  (COSOPT ) 2-0.5 % ophthalmic solution Instill 1 drop into both eyes twice a day 10 mL 11   gabapentin  (NEURONTIN ) 100 MG capsule Take 1 capsule (100 mg total) by mouth 3 (three) times daily as needed. (Patient not taking: Reported on 04/01/2024) 90 capsule 2   hydrocortisone  (ANUSOL -HC) 25 MG suppository Place 1 suppository (25 mg total) rectally at bedtime. 12 suppository 1   lidocaine  (XYLOCAINE ) 5 % ointment Apply to anal area daily as needed. 50 g 1   lidocaine -prilocaine  (EMLA ) cream Apply topically daily as needed. 30 g 3   mirtazapine   (REMERON ) 30 MG tablet Take 1 tablet (30 mg total) by mouth at bedtime. 30 tablet 3   Multiple Vitamins-Minerals (MULTIVITAMIN WITH MINERALS) tablet Take 1 tablet by mouth daily. Gummy 50 mg     ondansetron  (ZOFRAN ) 8 MG tablet Take 1 tablet (8 mg total) by mouth every 8 (eight) hours as needed for nausea or vomiting. Take after 3 days of chemo 30 tablet 2   polyethylene glycol powder (GLYCOLAX/MIRALAX) 17 GM/SCOOP powder Take 1 Container by mouth once.     potassium chloride  (KLOR-CON  M) 10 MEQ tablet Take 1 tablet (10 mEq total) by mouth 2 (two) times daily. 60 tablet 3   prochlorperazine  (COMPAZINE ) 10 MG tablet Take 1 tablet (10 mg total) by mouth every 6 (six) hours as needed. 30 tablet 2   rivaroxaban  (XARELTO ) 20 MG  TABS tablet Take 1 tablet (20 mg total) by mouth daily with supper. 30 tablet 2   urea  (CARMOL) 10 % cream Apply topically 2 times daily as needed. 85 g 3   Current Facility-Administered Medications  Medication Dose Route Frequency Provider Last Rate Last Admin   0.9 %  sodium chloride  infusion  500 mL Intravenous Continuous Lylie Blacklock V, MD        Allergies as of 04/01/2024 - Review Complete 04/01/2024  Allergen Reaction Noted   No known allergies  01/31/2022    Family History  Problem Relation Age of Onset   Headache Son    Colon cancer Neg Hx    Stomach cancer Neg Hx    Esophageal cancer Neg Hx     Social History   Socioeconomic History   Marital status: Married    Spouse name: Designer, jewellery   Number of children: Not on file   Years of education: Not on file   Highest education level: Not on file  Occupational History   Not on file  Tobacco Use   Smoking status: Never   Smokeless tobacco: Never  Vaping Use   Vaping status: Never Used  Substance and Sexual Activity   Alcohol use: No   Drug use: No   Sexual activity: Not on file  Other Topics Concern   Not on file  Social History Narrative   Came to [place]  from [place] in 2013.   Does  not know much family history [place] to medical care.   Social Drivers of Corporate investment banker Strain: Not on file  Food Insecurity: No Food Insecurity (01/28/2024)   Hunger Vital Sign    Worried About Running Out of Food in the Last Year: Never true    Ran Out of Food in the Last Year: Never true  Transportation Needs: No Transportation Needs (01/28/2024)   PRAPARE - Administrator, Civil Service (Medical): No    Lack of Transportation (Non-Medical): No  Physical Activity: Not on file  Stress: Not on file  Social Connections: Socially Integrated (01/28/2024)   Social Connection and Isolation Panel    Frequency of Communication with Friends and Family: More than three times a week    Frequency of Social Gatherings with Friends and Family: More than three times a week    Attends Religious Services: More than 4 times per year    Active Member of [employer] or Organizations: Yes    Attends Engineer, structural: More than 4 times per year    Marital Status: Married  Catering manager Violence: Not At Risk (01/28/2024)   Humiliation, Afraid, Rape, and Kick questionnaire    Fear of Current or Ex-Partner: No    Emotionally Abused: No    Physically Abused: No    Sexually Abused: No    Review of Systems:  All other review of systems negative except as mentioned in the HPI.  Physical Exam: Vital signs in last 24 hours: BP 126/88   Pulse 83   Temp 98 F (36.7 C) (Temporal)   Ht 4' 9 (1.448 m)   Wt 106 lb (48.1 kg)   SpO2 100%   BMI 22.94 kg/m  General:   Alert, NAD Lungs:  Clear .   Heart:  Regular rate and rhythm Abdomen:  Soft, nontender and nondistended. Neuro/Psych:  Alert and cooperative. Normal mood and affect. A and O x 3  Reviewed labs,  radiology imaging, old records and pertinent past GI work up  Patient is appropriate for planned procedure(s) and anesthesia in an ambulatory setting   K. Veena Ronique Simerly  , MD [phone]

## 2024-04-01 NOTE — Progress Notes (Unsigned)
 To pacu, VSS. Report to [email or web address].tb

## 2024-04-01 NOTE — Op Note (Addendum)
 [facility] Patient Name: Jillian Lutz Procedure Date: 04/01/2024 3:59 PM MRN: [id number] Endoscopist: Gustav ALONSO Mcgee , MD, [id number] Age: 73 Referring MD:  Date of Birth: 1950-09-22 Gender: Female Account #: [id number] Procedure:                Colonoscopy Indications:              Evaluation of unexplained GI bleeding presenting                            with Hematochezia, Rectal bleeding, Colorectal                            cancer, Follow-up of colorectal cancer Medicines:                Monitored Anesthesia Care Procedure:                Pre-Anesthesia Assessment:                           - Prior to the procedure, a History and Physical                            was performed, and patient medications and                            allergies were reviewed. The patient's tolerance of                            previous anesthesia was also reviewed. The risks                            and benefits of the procedure and the sedation                            options and risks were discussed with the patient.                            All questions were answered, and informed consent                            was obtained. Prior Anticoagulants: The patient                            last took Xarelto  (rivaroxaban ) 2 days prior to the                            procedure. ASA Grade Assessment: III - A patient                            with severe systemic disease. After reviewing the                            risks and benefits, the patient was deemed in  satisfactory condition to undergo the procedure.                           After obtaining informed consent, the colonoscope                            was passed under direct vision. Throughout the                            procedure, the patient's blood pressure, pulse, and                            oxygen saturations were monitored continuously. The                             Olympus Scope [id number] was introduced through the                            anus with the intention of advancing to the cecum.                            The scope was advanced to the rectum before the                            procedure was aborted. Medications were given. The                            colonoscopy was performed without difficulty. The                            patient tolerated the procedure well. The quality                            of the bowel preparation was adequate. The                            ileocecal valve, appendiceal orifice, and rectum                            were photographed. Scope In: 4:19:04 PM Scope Out: 4:24:18 PM Total Procedure Duration: 0 hours 5 minutes 14 seconds  Findings:                 The perianal and digital rectal examinations were                            normal.                           An infiltrative partially obstructing large mass                            was found in the rectum. The mass was  circumferential. In addition, its diameter measured                            six mm. Oozing was present. This was biopsied with                            a cold forceps for histology. Complications:            No immediate complications. Estimated Blood Loss:     Estimated blood loss was minimal. 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 Biopsied. Recommendation:           - Resume previous diet.                           - Continue present medications.                           - Await pathology results.                           - Follow up with oncology, consider palliative                            surgery (Colostomy) to prevent colonic obstruction                           - Resume Xarelto  (rivaroxaban ) at prior dose today. Pernie Grosso V. Salle Brandle, MD 04/01/2024 4:41:21 PM This report has been signed electronically.

## 2024-04-01 NOTE — Congregational Nurse Program (Signed)
 Accompanied patient to colonoscopy procedure at [facility].  Dr. Shila stated she was unable to complete procedure due to tumor growth blocking rectum.  She stated patient needs surgery to prevent bowel obstruction.  Patient will see Dr. Lanny next week to discuss treatment plan.  [name] RN, Congregational Nurse [phone]

## 2024-04-02 ENCOUNTER — Telehealth: Payer: Self-pay | Admitting: *Deleted

## 2024-04-02 ENCOUNTER — Encounter: Payer: Self-pay | Admitting: Gastroenterology

## 2024-04-02 NOTE — Telephone Encounter (Signed)
  Follow up Call-     04/01/2024    2:42 PM 01/31/2022    2:30 PM  Call back number  Post procedure Call Back phone  # Call Dewitt (interpreter) at [phone]   Phone comments he will call the pt on 3 way to check on her and interpret   Permission to leave phone message Yes Yes     Patient questions:  Message left to call us  if necessary.

## 2024-04-03 ENCOUNTER — Other Ambulatory Visit: Payer: Self-pay

## 2024-04-04 LAB — SURGICAL PATHOLOGY

## 2024-04-08 ENCOUNTER — Other Ambulatory Visit: Payer: Self-pay

## 2024-04-08 ENCOUNTER — Inpatient Hospital Stay (HOSPITAL_BASED_OUTPATIENT_CLINIC_OR_DEPARTMENT_OTHER): Admitting: Hematology

## 2024-04-08 ENCOUNTER — Encounter: Payer: Self-pay | Admitting: Hematology

## 2024-04-08 ENCOUNTER — Inpatient Hospital Stay

## 2024-04-08 VITALS — BP 110/68 | HR 70 | Temp 99.0°F | Resp 16 | Ht <= 58 in | Wt 110.8 lb

## 2024-04-08 DIAGNOSIS — I82432 Acute embolism and thrombosis of left popliteal vein: Secondary | ICD-10-CM

## 2024-04-08 DIAGNOSIS — C20 Malignant neoplasm of rectum: Secondary | ICD-10-CM

## 2024-04-08 DIAGNOSIS — Z5111 Encounter for antineoplastic chemotherapy: Secondary | ICD-10-CM | POA: Diagnosis not present

## 2024-04-08 LAB — CBC WITH DIFFERENTIAL (CANCER CENTER ONLY)
Abs Immature Granulocytes: 0.02 K/uL (ref 0.00–0.07)
Basophils Absolute: 0 K/uL (ref 0.0–0.1)
Basophils Relative: 1 %
Eosinophils Absolute: 0.2 K/uL (ref 0.0–0.5)
Eosinophils Relative: 4 %
HCT: 28.7 % — ABNORMAL LOW (ref 36.0–46.0)
Hemoglobin: 9.3 g/dL — ABNORMAL LOW (ref 12.0–15.0)
Immature Granulocytes: 0 %
Lymphocytes Relative: 16 %
Lymphs Abs: 0.8 K/uL (ref 0.7–4.0)
MCH: 25.3 pg — ABNORMAL LOW (ref 26.0–34.0)
MCHC: 32.4 g/dL (ref 30.0–36.0)
MCV: 78.2 fL — ABNORMAL LOW (ref 80.0–100.0)
Monocytes Absolute: 0.7 K/uL (ref 0.1–1.0)
Monocytes Relative: 14 %
Neutro Abs: 3.1 K/uL (ref 1.7–7.7)
Neutrophils Relative %: 65 %
Platelet Count: 259 K/uL (ref 150–400)
RBC: 3.67 MIL/uL — ABNORMAL LOW (ref 3.87–5.11)
RDW: 20.5 % — ABNORMAL HIGH (ref 11.5–15.5)
WBC Count: 4.8 K/uL (ref 4.0–10.5)
nRBC: 0 % (ref 0.0–0.2)

## 2024-04-08 LAB — CMP (CANCER CENTER ONLY)
ALT: <5 U/L (ref 0–44)
AST: 14 U/L — ABNORMAL LOW (ref 15–41)
Albumin: 3.5 g/dL (ref 3.5–5.0)
Alkaline Phosphatase: 51 U/L (ref 38–126)
Anion gap: 5 (ref 5–15)
BUN: 17 mg/dL (ref 8–23)
CO2: 32 mmol/L (ref 22–32)
Calcium: 8.6 mg/dL — ABNORMAL LOW (ref 8.9–10.3)
Chloride: 102 mmol/L (ref 98–111)
Creatinine: 0.86 mg/dL (ref 0.44–1.00)
GFR, Estimated: >60 mL/min (ref >60)
Glucose, Bld: 134 mg/dL — ABNORMAL HIGH (ref 70–99)
Potassium: 3.5 mmol/L (ref 3.5–5.1)
Sodium: 139 mmol/L (ref 135–145)
Total Bilirubin: 0.3 mg/dL (ref 0.0–1.2)
Total Protein: 7 g/dL (ref 6.5–8.1)

## 2024-04-08 LAB — CEA (ACCESS): CEA (CHCC): 36.19 ng/mL — ABNORMAL HIGH (ref 0.00–5.00)

## 2024-04-08 MED ORDER — DEXAMETHASONE SODIUM PHOSPHATE 10 MG/ML IJ SOLN
10.0000 mg | Freq: Once | INTRAMUSCULAR | Status: AC
  Administered 2024-04-08: 10 mg via INTRAVENOUS
  Filled 2024-04-08: qty 1

## 2024-04-08 MED ORDER — CAPECITABINE 500 MG PO TABS
ORAL_TABLET | ORAL | 2 refills | Status: DC
  Filled 2024-04-08: qty 56, 21d supply, fill #0
  Filled 2024-04-23 – 2024-04-25 (×2): qty 56, 21d supply, fill #1

## 2024-04-08 MED ORDER — DEXTROSE 5 % IV SOLN: Freq: Once | INTRAVENOUS | Status: AC

## 2024-04-08 MED ORDER — PALONOSETRON HCL INJECTION 0.25 MG/5ML
0.2500 mg | Freq: Once | INTRAVENOUS | Status: AC
  Administered 2024-04-08: 0.25 mg via INTRAVENOUS
  Filled 2024-04-08: qty 5

## 2024-04-08 MED ORDER — FAMOTIDINE IN NACL 20-0.9 MG/50ML-% IV SOLN
20.0000 mg | Freq: Once | INTRAVENOUS | Status: AC
  Administered 2024-04-08: 20 mg via INTRAVENOUS
  Filled 2024-04-08: qty 50

## 2024-04-08 MED ORDER — SODIUM CHLORIDE 0.9 % IV SOLN
7.5000 mg/kg | Freq: Once | INTRAVENOUS | Status: AC
  Administered 2024-04-08: 350 mg via INTRAVENOUS
  Filled 2024-04-08: qty 14

## 2024-04-08 MED ORDER — DIPHENHYDRAMINE HCL 50 MG/ML IJ SOLN
25.0000 mg | Freq: Once | INTRAMUSCULAR | Status: AC
  Administered 2024-04-08: 25 mg via INTRAVENOUS
  Filled 2024-04-08: qty 1

## 2024-04-08 MED ORDER — SODIUM CHLORIDE 0.9 % IV SOLN: Freq: Once | INTRAVENOUS | Status: AC

## 2024-04-08 MED ORDER — OXALIPLATIN CHEMO INJECTION 100 MG/20ML
70.0000 mg/m2 | Freq: Once | INTRAVENOUS | Status: AC
  Administered 2024-04-08: 100 mg via INTRAVENOUS
  Filled 2024-04-08: qty 3.25

## 2024-04-08 NOTE — Patient Instructions (Signed)
 [facility] - A DEPT OF . [facility]  Discharge Instructions: Thank you for choosing [facility] to provide your oncology and hematology care.   If you have a lab appointment with the Cancer Center, please go directly to the Cancer Center and check in at the registration area.   Wear comfortable clothing and clothing appropriate for easy access to any Portacath or PICC line.   We strive to give you quality time with your provider. You may need to reschedule your appointment if you arrive late (15 or more minutes).  Arriving late affects you and other patients whose appointments are after yours.  Also, if you miss three or more appointments without notifying the office, you may be dismissed from the clinic at the provider's discretion.      For prescription refill requests, have your pharmacy contact our office and allow 72 hours for refills to be completed.    Today you received the following chemotherapy and/or immunotherapy agents: Bevacizumab  (Avastin )    To help prevent nausea and vomiting after your treatment, we encourage you to take your nausea medication as directed.  BELOW ARE SYMPTOMS THAT SHOULD BE REPORTED IMMEDIATELY: *FEVER GREATER THAN 100.4 F (38 C) OR HIGHER *CHILLS OR SWEATING *NAUSEA AND VOMITING THAT IS NOT CONTROLLED WITH YOUR NAUSEA MEDICATION *UNUSUAL SHORTNESS OF BREATH *UNUSUAL BRUISING OR BLEEDING *URINARY PROBLEMS (pain or burning when urinating, or frequent urination) *BOWEL PROBLEMS (unusual diarrhea, constipation, pain near the anus) TENDERNESS IN MOUTH AND THROAT WITH OR WITHOUT PRESENCE OF ULCERS (sore throat, sores in mouth, or a toothache) UNUSUAL RASH, SWELLING OR PAIN  UNUSUAL VAGINAL DISCHARGE OR ITCHING   Items with * indicate a potential emergency and should be followed up as soon as possible or go to the Emergency Department if any problems should occur.  Please show the CHEMOTHERAPY ALERT CARD or  IMMUNOTHERAPY ALERT CARD at check-in to the Emergency Department and triage nurse.  Should you have questions after your visit or need to cancel or reschedule your appointment, please contact [facility] - A DEPT OF [facility]  Dept: [phone]  and follow the prompts.  Office hours are 8:00 a.m. to 4:30 p.m. Monday - Friday. Please note that voicemails left after 4:00 p.m. may not be returned until the following business day.  We are closed weekends and major holidays. You have access to a nurse at all times for urgent questions. Please call the main number to the clinic Dept: [phone] and follow the prompts.   For any non-urgent questions, you may also contact your provider using MyChart. We now offer e-Visits for anyone 65 and older to request care online for non-urgent symptoms. For details visit [email or web address].   Also download the MyChart app! Go to the app store, search MyChart, open the app, select [place], and log in with your MyChart username and password.

## 2024-04-08 NOTE — Congregational Nurse Program (Signed)
 Accompanied patient to visit with Dr. Lanny.  Patient states she has been eating well, having no problems with constipation or diarrhea and no rectal bleeding.  Rectal pain level is at  3 today.  She discussed wanting to visit [place] with her husband and requested written information regarding cancer treatment to take with her.  No definite plans at present but plans to stay 3 months.  States she is still thinking about surgery recommended by Dr. Ivana.  Will receive IV chemo today and finish current round of Xeloda  tomorrow.  [name] RN, Congregational Nurse [phone]

## 2024-04-08 NOTE — Progress Notes (Signed)
 [facility]   [phone] [phone]   Clinic Follow up Note   Patient Care Team: [name], DO as PCP - General (Internal Medicine) [name], NP as Nurse Practitioner (Nurse Practitioner) [name], MD as Consulting Physician (Hematology and Oncology) [name], MD as Consulting Physician (Radiation Oncology) [name], [name], MD as Consulting Physician (Gastroenterology) [name], DO as Consulting Physician (Pulmonary Disease) [name], MD as Consulting Physician (General Surgery)  Date of Service:  04/08/2024  CHIEF COMPLAINT: f/u of rectal cancer  CURRENT THERAPY:  Chemotherapy CapeOx and bevacizumab   Oncology History   Left leg DVT (HCC) -Doppler on July 13, 2022 showed left acute DVT involving popliteal and peroneal vein, CTA chest was negative for PE. -continue Xarelto , tolerating well    Rectal adenocarcinoma (HCC) cT3cN1M0 stage IIIb, biopsy confirmed residual primary tumor and oligo lung met in 09/2021 -Initially diagnosed 11/2020 by colonoscopy for progressive rectal pain and bleeding, weight loss, and constipation. -Despite strong recommendation, patient firmly and repeatedly declined IV chemo and surgery. She agreed to chemoRT with Xeloda , and received the treatment 01/04/21 - 02/14/21. -she developed local cancer progression and lung mets in 11/2021 -she started Xeloda  on 02/16/2022, low dose oxaliplatin  was added on 05/11/22 (with cycle 4 of Xeloda )  -chemo held since 07/2022 due to leg pain and recurrent nausea  -restaging CT 09/07/2022 showed stable disease in rectum and lung, no new lesions.   -she had chemo break from Feb-May 2024 -restart CAPOX with dose reduction 12/18/2022, not tolerating very well overall.  Oxaliplatin  was held on August 5. She is on oxaliplatin  every 6 weeks now.  -Restaging CT scan from 10/09/2023 showed stable rectal wall thickening, no other  evidence of metastasis  -  Restaging CT scan from January 21, 2024 showed diminished circumferential wall thickening of a low rectal mass, no evidence of distant metastasis.  Assessment & Plan Rectal cancer Rectal cancer with persistent tumor and occasional mild bleeding. Currently undergoing non-curative chemotherapy. Surgery recommended due to total blockage, but undecided. Considering travel to [place] and feasibility of treatment continuation there. Concerned about post-surgery recovery time of 2-3 weeks without complications. Surgery may eliminate need for ongoing chemotherapy. - Administer intravenous chemotherapy today. - Discuss surgical options with a surgeon for potential tumor removal. - Provide medical records for potential treatment continuation in [place]. - Advise on port flushing every 4-8 weeks if traveling. - Refill Xeloda  prescription.  Plan - I reviewed her recent colonoscopy result, which showed persistent upper rectal tumor, not obstructive. - I encouraged her to consider surgery, she is do not sure about surgery.  - Lab reviewed, adequate for treatment, will proceed bevacizumab  and oxaliplatin  today, she will continue Xeloda  at home. - Follow-up in 3 weeks before next cycle bevacizumab    SUMMARY OF ONCOLOGIC HISTORY: Oncology History Overview Note  Cancer Staging Rectal adenocarcinoma [facility]) Staging form: Colon and Rectum, AJCC 8th Edition - Clinical stage from 12/21/2020: Stage IIIB (cT3, cN1, cM0) - Unsigned    Rectal adenocarcinoma (HCC)  08/26/2020 Miscellaneous   Initial presentation to PCP, reporting intermittent BRBPR since 02/2020    12/01/2020 Procedure   Colonoscopy by Dr. Shila findings - The perianal and digital rectal examinations were normal. - A 15 mm polyp was found in the ascending colon. The polyp was semi-pedunculated. Resection and retrieval were complete. - A 25 mm polyp was found in the sigmoid colon. The polyp was pedunculated. Resection and retrieval were complete. -  An infiltrative partially  obstructing large mass was found in the proximal rectum. The mass was partially circumferential (involving one-half of the lumen circumference). The mass measured eight cm in length extending from 10 to 18cm from anal verge. This was biopsied with a cold forceps for histology. Proximal and distal opposite fold area of the mass lesion was tattooed with an injection of total 3 mL of Spot (carbon black).   12/01/2020 Initial Biopsy   Diagnosis 1. Colon, polyp(s), ascending x 1 - ADENOCARCINOMA ARISING IN A TUBULAR ADENOMA WITH HIGH-GRADE DYSPLASIA. SEE NOTE 2. Colon, sigmoid polyp, x1 - TUBULOVILLOUS ADENOMA(S) - NEGATIVE FOR HIGH-GRADE DYSPLASIA OR MALIGNANCY 3. Rectum, biopsy - ADENOCARCINOMA. SEE NOTE   12/01/2020 Cancer Staging   Cancer Staging Rectal adenocarcinoma [facility]) Staging form: Colon and Rectum, AJCC 8th Edition - Clinical stage from 12/21/2020: Stage IIIB (cT3, cN1, cM0) - Unsigned    12/06/2020 Imaging   CT CAP with contrast IMPRESSION: 1. There is partially circumferential soft tissue thickening of the mid to superior rectum, approximately 5 cm in length and the inferior extent approximately 6 cm above the anal verge, consistent with primary rectal malignancy identified by colonoscopy. 2. There appear to be abnormally enlarged perirectal lymph nodes posteriorly about the superior rectum measuring up to 1.0 x 0.8 cm. Findings are suspicious for perirectal nodal metastatic disease, however rectal MRI is the test of choice for initial local staging of rectal cancer. 3. There is a 4 mm nonspecific pulmonary nodule of the superior segment left lower lobe, statistically most likely incidental, infectious or inflammatory, although nonspecific and isolated metastatic disease is not strictly excluded. Attention on follow-up. 4. No other evidence of metastatic disease in the chest, abdomen, or pelvis. Aortic Atherosclerosis (ICD10-I70.0).   12/09/2020  Imaging   Local staging MRI pelvis without contrast IMPRESSION: 4.9 cm circumferential mid/lower rectal mass, corresponding to the patient's newly diagnosed rectal cancer. Rectal adenocarcinoma T stage: T3c Rectal adenocarcinoma N stage:  N1 Distance from tumor to the internal anal sphincter is 3.7 cm.   12/21/2020 Initial Diagnosis   Rectal adenocarcinoma (HCC)   01/04/2021 -  Chemotherapy   Concurrent chemoradiation with Xeloda  1000mg  in the AM and 1500mg  in the PM on days of Radiation starting 01/04/21.       01/04/2021 - 02/11/2021 Radiation Therapy   Concurrent chemoradiation with Dr Dewey and Xeloda  starting 01/04/21.    01/31/2022 Procedure   Colonoscopy, Dr. Shila  Findings: - An infiltrative partially obstructing large mass was found in the rectum. The mass was circumferential. The mass measured ten cm in length, extending from 5-15cm from anal verge. In addition, rectal diameter at narrowest approx twelve mm. Oozing was present. Biopsies were taken with a cold forceps for histology.  Impression: - Hemorrhoids found on perianal exam. - Likely malignant partially obstructing tumor in the rectum. Biopsied. - Non-bleeding external and internal hemorrhoids.   01/31/2022 Pathology Results   Diagnosis Rectum, biopsy INVASIVE MODERATELY DIFFERENTIATED ADENOCARCINOMA   05/11/2022 -  Chemotherapy   Patient is on Treatment Plan : COLORECTAL Xelox (Capeox)(130/850) q21d     09/07/2022 Imaging    IMPRESSION: 1. No significant change in circumferential wall thickening of the rectum, in keeping with known primary rectal mass. Similar appearance of post treatment perirectal and presacral fat stranding and fascial thickening 2. Unchanged nodule of the superior segment left lower lobe with adjacent bandlike scarring. No new nodules. 3. No evidence of lymphadenopathy or other metastatic disease in the chest, abdomen, or pelvis. 4. Large burden of stool throughout  the colon. 5.  Cardiomegaly.   12/08/2022 Imaging    IMPRESSION: Persistent wall thickening along the rectum with adjacent severe inflammatory stranding and nodularity. Please correlate with exact location of the distribution of the patient's neoplasm.   No discrete new mass lesion, fluid collection or lymph node enlargement.   Improved visualization of the fractures involving the left side of the pubic symphysis as well as probable insufficiency fractures along the sacrum. Associated areas of increasing heterogeneous bony sclerosis along the pubic bones and sacrum. In principle sclerotic bone metastases would be in the differential but are felt to be less likely based on the overall appearance. If needed confirmatory MRI can be considered as clinically appropriate to further delineate.   Stable scarring and fibrotic changes along the lungs with a tiny nodular area along the superior segment of the left lower lobe. Simple continued follow up.   Enlarged heart.   10/09/2023 Imaging   CT chest abdomen and pelvis with contrast  MPRESSION: 1. Persistent circumferential wall thickening in the rectum, not substantially changed taking into account less rectal distention on the current study. 2. Marked stool volume throughout the colon proximal to the rectum, similar to minimally progressive in the interval. While this may reflect clinical constipation, a degree of stricture related to the rectal neoplasm cannot be excluded. 3. No findings on the current study to suggest progressive or metastatic disease. 4. Heterogeneous mineralization of the sacrum, as before. Nonspecific by CT, features may reflect old trauma.      Discussed the use of AI scribe software for clinical note transcription with the patient, who gave verbal consent to proceed.  History of Present Illness Jillian Lutz is a 73 year old female with rectal cancer who presents for follow-up.  She reports feeling better with an improved  appetite and no recent bleeding. Bowel movements are regular, and no new symptoms have emerged since her last visit three weeks ago. A recent colonoscopy confirmed the presence of the tumor. She experienced a previous episode of minor bleeding, which was not severe.  She is on oral chemotherapy with Xeloda , initiated on September 9th, five days later than planned. She is considering a trip to [place] for about ninety days and is exploring options for continuing treatment there. She plans to take her medical records and may seek a physician in [place] if necessary. Her husband may accompany her on the trip.     All other systems were reviewed with the patient and are negative.  MEDICAL HISTORY:  Past Medical History:  Diagnosis Date   Bilateral wrist pain 10/25/2017   Hypertension    Left leg DVT (deep venous thrombosis) (HCC) 06/2022   Neck mass 1998   Unknown biopsy results. Excised in [place].   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   Rectal cancer (HCC)    Trigger finger, acquired 02/08/2017    SURGICAL HISTORY: Past Surgical History:  Procedure Laterality Date   BRONCHIAL BIOPSY  12/13/2021   Procedure: BRONCHIAL BIOPSIES;  Surgeon: [name], DO;  Location: [facility];  Service: Pulmonary;;   BRONCHIAL NEEDLE ASPIRATION BIOPSY  12/13/2021   Procedure: BRONCHIAL NEEDLE ASPIRATION BIOPSIES;  Surgeon: [name], DO;  Location: [facility];  Service: Pulmonary;;   IR IMAGING GUIDED PORT INSERTION  09/17/2023   NECK SURGERY Left 1998   Excision of mass in [place]   VIDEO BRONCHOSCOPY WITH RADIAL ENDOBRONCHIAL ULTRASOUND  12/13/2021   Procedure: RADIAL ENDOBRONCHIAL ULTRASOUND;  Surgeon:  [name], DO;  Location: [facility];  Service: Pulmonary;;    I have reviewed the social history and family history with the patient and they are unchanged from previous note.  ALLERGIES:  is allergic to no known allergies.  MEDICATIONS:   Current Outpatient Medications  Medication Sig Dispense Refill   amLODipine  (NORVASC ) 5 MG tablet Take 1 tablet (5 mg total) by mouth daily. 90 tablet 3   brimonidine  (ALPHAGAN ) 0.2 % ophthalmic solution Place 1 drop into the right eye 2 (two) times daily. (Patient not taking: Reported on 04/01/2024) 10 mL 11   capecitabine  (XELODA ) 500 MG tablet Take 2 tabs every 12 hours, for 14 days then off for 7 days. Take 30mins after meal 56 tablet 2   dexamethasone  (DECADRON ) 4 MG tablet Take 1 tablet (4 mg total) by mouth daily. TAKE DAILY FOR 3-5 DAYS AFTER IV CHEMO 20 tablet 1   diclofenac  Sodium (VOLTAREN ) 1 % GEL Apply 4 grams topically 4 (four) times daily. 100 g 3   dorzolamide -timolol  (COSOPT ) 2-0.5 % ophthalmic solution Instill 1 drop into both eyes twice a day 10 mL 11   gabapentin  (NEURONTIN ) 100 MG capsule Take 1 capsule (100 mg total) by mouth 3 (three) times daily as needed. (Patient not taking: Reported on 04/01/2024) 90 capsule 2   hydrocortisone  (ANUSOL -HC) 25 MG suppository Place 1 suppository (25 mg total) rectally at bedtime. 12 suppository 1   lidocaine  (XYLOCAINE ) 5 % ointment Apply to anal area daily as needed. 50 g 1   lidocaine -prilocaine  (EMLA ) cream Apply topically daily as needed. 30 g 3   lisinopril -hydrochlorothiazide  (ZESTORETIC ) 20-12.5 MG tablet Take 2 tablets by mouth daily. 60 tablet 3   mirtazapine  (REMERON ) 30 MG tablet Take 1 tablet (30 mg total) by mouth at bedtime. 30 tablet 3   Multiple Vitamins-Minerals (MULTIVITAMIN WITH MINERALS) tablet Take 1 tablet by mouth daily. Gummy 50 mg     ondansetron  (ZOFRAN ) 8 MG tablet Take 1 tablet (8 mg total) by mouth every 8 (eight) hours as needed for nausea or vomiting. Take after 3 days of chemo 30 tablet 2   polyethylene glycol powder (GLYCOLAX/MIRALAX) 17 GM/SCOOP powder Take 1 Container by mouth once.     potassium chloride  (KLOR-CON  M) 10 MEQ tablet Take 1 tablet (10 mEq total) by mouth 2 (two) times daily. 60 tablet 3    prochlorperazine  (COMPAZINE ) 10 MG tablet Take 1 tablet (10 mg total) by mouth every 6 (six) hours as needed. 30 tablet 2   rivaroxaban  (XARELTO ) 20 MG TABS tablet Take 1 tablet (20 mg total) by mouth daily with supper. 30 tablet 2   traMADol  (ULTRAM ) 50 MG tablet Take 1 tablet (50 mg total) by mouth every 12 (twelve) hours as needed. 60 tablet 0   urea  (CARMOL) 10 % cream Apply topically 2 times daily as needed. 85 g 3   No current facility-administered medications for this visit.    PHYSICAL EXAMINATION: ECOG PERFORMANCE STATUS: 1 - Symptomatic but completely ambulatory  Vitals:   04/08/24 1117  BP: 110/68  Pulse: 70  Resp: 16  Temp: 99 F (37.2 C)  SpO2: 97%   Wt Readings from Last 3 Encounters:  04/08/24 110 lb 12.8 oz (50.3 kg)  04/01/24 106 lb (48.1 kg)  03/19/24 110 lb 12.8 oz (50.3 kg)     GENERAL:alert, no distress and comfortable SKIN: skin color, texture, turgor are normal, no rashes or significant lesions EYES: normal, Conjunctiva are pink and non-injected, sclera clear NECK:  supple, thyroid normal size, non-tender, without nodularity LYMPH:  no palpable lymphadenopathy in the cervical, axillary  LUNGS: clear to auscultation and percussion with normal breathing effort HEART: regular rate & rhythm and no murmurs and no lower extremity edema ABDOMEN:abdomen soft, non-tender and normal bowel sounds Musculoskeletal:no cyanosis of digits and no clubbing  NEURO: alert & oriented x 3 with fluent speech, no focal motor/sensory deficits  Physical Exam    LABORATORY DATA:  I have reviewed the data as listed    Latest Ref Rng & Units 04/08/2024   11:05 AM 03/19/2024    9:58 AM 02/27/2024    9:43 AM  CBC  WBC 4.0 - 10.5 K/uL 4.8  4.6  6.3   Hemoglobin 12.0 - 15.0 g/dL 9.3  8.9  8.8   Hematocrit 36.0 - 46.0 % 28.7  27.1  26.3   Platelets 150 - 400 K/uL 259  202  249         Latest Ref Rng & Units 04/08/2024   11:05 AM 02/27/2024    9:43 AM 02/18/2024   10:44 AM   CMP  Glucose 70 - 99 mg/dL 865  809  890   BUN 8 - 23 mg/dL 17  23  16    Creatinine 0.44 - 1.00 mg/dL 9.13  9.14  9.19   Sodium 135 - 145 mmol/L 139  136  140   Potassium 3.5 - 5.1 mmol/L 3.5  3.1  3.4   Chloride 98 - 111 mmol/L 102  101  104   CO2 22 - 32 mmol/L 32  31  32   Calcium 8.9 - 10.3 mg/dL 8.6  8.5  8.6   Total Protein 6.5 - 8.1 g/dL 7.0  6.7  6.9   Total Bilirubin 0.0 - 1.2 mg/dL 0.3  0.4  0.4   Alkaline Phos 38 - 126 U/L 51  53  53   AST 15 - 41 U/L 14  13  14    ALT 0 - 44 U/L <5  <5  <5       RADIOGRAPHIC STUDIES: I have personally reviewed the radiological images as listed and agreed with the findings in the report. No results found.    No orders of the defined types were placed in this encounter.  All questions were answered. The patient knows to call the clinic with any problems, questions or concerns. No barriers to learning was detected. The total time spent in the appointment was 30 minutes, including review of chart and various tests results, discussions about plan of care and coordination of care plan     Onita Mattock, MD 04/08/2024

## 2024-04-08 NOTE — Assessment & Plan Note (Signed)
-  Doppler on July 13, 2022 showed left acute DVT involving popliteal and peroneal vein, CTA chest was negative for PE. -continue Xarelto, tolerating well

## 2024-04-08 NOTE — Assessment & Plan Note (Signed)
 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 -Despite strong recommendation, patient firmly and repeatedly declined IV chemo and surgery. She agreed to chemoRT with Xeloda , and received the treatment 01/04/21 - 02/14/21. -she developed local cancer progression and lung mets in 11/2021 -she started Xeloda  on 02/16/2022, low dose oxaliplatin  was added on 05/11/22 (with cycle 4 of Xeloda )  -chemo held since 07/2022 due to leg pain and recurrent nausea  -restaging CT 09/07/2022 showed stable disease in rectum and lung, no new lesions.   -she had chemo break from Feb-May 2024 -restart CAPOX with dose reduction 12/18/2022, not tolerating very well overall.  Oxaliplatin  was held on August 5. She is on oxaliplatin  every 6 weeks now.  -Restaging CT scan from 10/09/2023 showed stable rectal wall thickening, no other  evidence of metastasis  - Restaging CT scan from January 21, 2024 showed diminished circumferential wall thickening of a low rectal mass, no evidence of distant metastasis.

## 2024-04-09 ENCOUNTER — Encounter: Payer: Self-pay | Admitting: Hematology

## 2024-04-09 ENCOUNTER — Other Ambulatory Visit: Payer: Self-pay

## 2024-04-09 ENCOUNTER — Other Ambulatory Visit: Payer: Self-pay | Admitting: Pharmacist

## 2024-04-12 ENCOUNTER — Telehealth: Payer: Self-pay

## 2024-04-12 NOTE — Telephone Encounter (Signed)
 Interpreter [name] called stating patient wants to know when to restart Xeloda .  Explained that she waits 1 week after completing the last round (which was 04/09/24) meaning she will start the new bottle on 04/16/2024.  [name] RN, Congregational Nurse [phone]

## 2024-04-16 DIAGNOSIS — C20 Malignant neoplasm of rectum: Secondary | ICD-10-CM | POA: Diagnosis not present

## 2024-04-21 ENCOUNTER — Encounter: Payer: Self-pay | Admitting: Hematology

## 2024-04-21 ENCOUNTER — Other Ambulatory Visit: Payer: Self-pay

## 2024-04-21 NOTE — Progress Notes (Signed)
 The following biosimilar Vegzelma  (bevacizumab -adcd)  has been selected for use in this patient.  Pernie Grosso, Pharm.D., CPP 04/21/2024@1 :42 PM

## 2024-04-23 ENCOUNTER — Other Ambulatory Visit: Payer: Self-pay

## 2024-04-24 ENCOUNTER — Other Ambulatory Visit: Payer: Self-pay

## 2024-04-25 ENCOUNTER — Other Ambulatory Visit: Payer: Self-pay

## 2024-04-25 NOTE — Progress Notes (Signed)
 Specialty Pharmacy Refill Coordination Note  Jillian Lutz is a 73 y.o. female contacted today regarding refills of specialty medication(s) Capecitabine  (XELODA )  Spoke with carolyn  Patient requested Delivery   Delivery date: 05/02/24   Verified address: [address] [place]   Medication will be filled on 10.16.25.

## 2024-04-25 NOTE — Assessment & Plan Note (Deleted)
-  Doppler on July 13, 2022 showed left acute DVT involving popliteal and peroneal vein, CTA chest was negative for PE. -continue Xarelto, tolerating well

## 2024-04-25 NOTE — Assessment & Plan Note (Deleted)
 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 -Despite strong recommendation, patient firmly and repeatedly declined IV chemo and surgery. She agreed to chemoRT with Xeloda , and received the treatment 01/04/21 - 02/14/21. -she developed local cancer progression and lung mets in 11/2021 -she started Xeloda  on 02/16/2022, low dose oxaliplatin  was added on 05/11/22 (with cycle 4 of Xeloda )  -chemo held since 07/2022 due to leg pain and recurrent nausea  -restaging CT 09/07/2022 showed stable disease in rectum and lung, no new lesions.   -she had chemo break from Feb-May 2024 -restart CAPOX with dose reduction 12/18/2022, not tolerating very well overall.  Oxaliplatin  was held on August 5. She is on oxaliplatin  every 6 weeks now.  -Restaging CT scan from 10/09/2023 showed stable rectal wall thickening, no other  evidence of metastasis  - Restaging CT scan from January 21, 2024 showed diminished circumferential wall thickening of a low rectal mass, no evidence of distant metastasis.

## 2024-04-28 ENCOUNTER — Inpatient Hospital Stay: Admitting: Hematology

## 2024-04-28 ENCOUNTER — Inpatient Hospital Stay

## 2024-04-28 ENCOUNTER — Telehealth: Payer: Self-pay | Admitting: Hematology

## 2024-04-28 DIAGNOSIS — I82432 Acute embolism and thrombosis of left popliteal vein: Secondary | ICD-10-CM

## 2024-04-28 DIAGNOSIS — C20 Malignant neoplasm of rectum: Secondary | ICD-10-CM

## 2024-04-28 NOTE — Telephone Encounter (Signed)
 [name] called in to re-schedule Wenda's appointments. She has been re-scheduled for 10/15.

## 2024-04-29 ENCOUNTER — Other Ambulatory Visit: Payer: Self-pay

## 2024-04-30 ENCOUNTER — Inpatient Hospital Stay: Attending: Physician Assistant

## 2024-04-30 ENCOUNTER — Other Ambulatory Visit: Payer: Self-pay

## 2024-04-30 ENCOUNTER — Inpatient Hospital Stay

## 2024-04-30 ENCOUNTER — Inpatient Hospital Stay (HOSPITAL_BASED_OUTPATIENT_CLINIC_OR_DEPARTMENT_OTHER): Admitting: Hematology

## 2024-04-30 ENCOUNTER — Other Ambulatory Visit (HOSPITAL_COMMUNITY): Payer: Self-pay

## 2024-04-30 ENCOUNTER — Encounter: Payer: Self-pay | Admitting: Hematology

## 2024-04-30 VITALS — BP 114/68 | HR 81 | Temp 98.5°F | Resp 17 | Ht <= 58 in | Wt 111.5 lb

## 2024-04-30 DIAGNOSIS — I82432 Acute embolism and thrombosis of left popliteal vein: Secondary | ICD-10-CM | POA: Diagnosis not present

## 2024-04-30 DIAGNOSIS — C7800 Secondary malignant neoplasm of unspecified lung: Secondary | ICD-10-CM | POA: Insufficient documentation

## 2024-04-30 DIAGNOSIS — K5900 Constipation, unspecified: Secondary | ICD-10-CM | POA: Diagnosis not present

## 2024-04-30 DIAGNOSIS — C20 Malignant neoplasm of rectum: Secondary | ICD-10-CM | POA: Diagnosis not present

## 2024-04-30 DIAGNOSIS — Z923 Personal history of irradiation: Secondary | ICD-10-CM | POA: Diagnosis not present

## 2024-04-30 DIAGNOSIS — Z86718 Personal history of other venous thrombosis and embolism: Secondary | ICD-10-CM | POA: Diagnosis not present

## 2024-04-30 DIAGNOSIS — K6289 Other specified diseases of anus and rectum: Secondary | ICD-10-CM | POA: Diagnosis not present

## 2024-04-30 DIAGNOSIS — Z5111 Encounter for antineoplastic chemotherapy: Secondary | ICD-10-CM | POA: Diagnosis present

## 2024-04-30 DIAGNOSIS — R630 Anorexia: Secondary | ICD-10-CM

## 2024-04-30 DIAGNOSIS — Z7901 Long term (current) use of anticoagulants: Secondary | ICD-10-CM | POA: Insufficient documentation

## 2024-04-30 LAB — CBC WITH DIFFERENTIAL (CANCER CENTER ONLY)
Abs Immature Granulocytes: 0.01 K/uL (ref 0.00–0.07)
Basophils Absolute: 0 K/uL (ref 0.0–0.1)
Basophils Relative: 1 %
Eosinophils Absolute: 0.1 K/uL (ref 0.0–0.5)
Eosinophils Relative: 1 %
HCT: 29.1 % — ABNORMAL LOW (ref 36.0–46.0)
Hemoglobin: 9.3 g/dL — ABNORMAL LOW (ref 12.0–15.0)
Immature Granulocytes: 0 %
Lymphocytes Relative: 20 %
Lymphs Abs: 1 K/uL (ref 0.7–4.0)
MCH: 24.3 pg — ABNORMAL LOW (ref 26.0–34.0)
MCHC: 32 g/dL (ref 30.0–36.0)
MCV: 76.2 fL — ABNORMAL LOW (ref 80.0–100.0)
Monocytes Absolute: 0.7 K/uL (ref 0.1–1.0)
Monocytes Relative: 15 %
Neutro Abs: 3.2 K/uL (ref 1.7–7.7)
Neutrophils Relative %: 63 %
Platelet Count: 245 K/uL (ref 150–400)
RBC: 3.82 MIL/uL — ABNORMAL LOW (ref 3.87–5.11)
RDW: 21.2 % — ABNORMAL HIGH (ref 11.5–15.5)
WBC Count: 5 K/uL (ref 4.0–10.5)
nRBC: 0 % (ref 0.0–0.2)

## 2024-04-30 LAB — COMPREHENSIVE METABOLIC PANEL WITH GFR
ALT: <5 U/L (ref 0–44)
AST: 14 U/L — ABNORMAL LOW (ref 15–41)
Albumin: 3.6 g/dL (ref 3.5–5.0)
Alkaline Phosphatase: 56 U/L (ref 38–126)
Anion gap: 6 (ref 5–15)
BUN: 17 mg/dL (ref 8–23)
CO2: 30 mmol/L (ref 22–32)
Calcium: 8.8 mg/dL — ABNORMAL LOW (ref 8.9–10.3)
Chloride: 105 mmol/L (ref 98–111)
Creatinine, Ser: 0.85 mg/dL (ref 0.44–1.00)
GFR, Estimated: >60 mL/min (ref >60)
Glucose, Bld: 115 mg/dL — ABNORMAL HIGH (ref 70–99)
Potassium: 3.3 mmol/L — ABNORMAL LOW (ref 3.5–5.1)
Sodium: 141 mmol/L (ref 135–145)
Total Bilirubin: 0.3 mg/dL (ref 0.0–1.2)
Total Protein: 7 g/dL (ref 6.5–8.1)

## 2024-04-30 LAB — TOTAL PROTEIN, URINE DIPSTICK: Protein, ur: NEGATIVE mg/dL

## 2024-04-30 MED ORDER — SODIUM CHLORIDE 0.9 % IV SOLN: Freq: Once | INTRAVENOUS | Status: AC

## 2024-04-30 MED ORDER — MIRTAZAPINE 30 MG PO TABS
30.0000 mg | ORAL_TABLET | Freq: Every day | ORAL | 3 refills | Status: DC
  Filled 2024-04-30: qty 30, 30d supply, fill #0

## 2024-04-30 MED ORDER — TRAMADOL HCL 50 MG PO TABS
50.0000 mg | ORAL_TABLET | Freq: Two times a day (BID) | ORAL | 0 refills | Status: DC | PRN
  Filled 2024-04-30: qty 60, 30d supply, fill #0

## 2024-04-30 MED ORDER — SODIUM CHLORIDE 0.9% FLUSH
10.0000 mL | INTRAVENOUS | Status: DC | PRN
  Administered 2024-04-30: 10 mL

## 2024-04-30 MED ORDER — SODIUM CHLORIDE 0.9 % IV SOLN
7.5000 mg/kg | Freq: Once | INTRAVENOUS | Status: AC
  Administered 2024-04-30: 350 mg via INTRAVENOUS
  Filled 2024-04-30: qty 14

## 2024-04-30 NOTE — Assessment & Plan Note (Signed)
-  Doppler on July 13, 2022 showed left acute DVT involving popliteal and peroneal vein, CTA chest was negative for PE. -continue Xarelto, tolerating well

## 2024-04-30 NOTE — Progress Notes (Signed)
 [facility]   [phone] Fax:([phone]) [phone]   Clinic Follow up Note   Patient Care Team: Jillian Gee, DO as PCP - General (Internal Medicine) Jillian Mayme POUR, NP as Nurse Practitioner (Nurse Practitioner) Jillian Callander, MD as Consulting Physician (Hematology and Oncology) Jillian Rush, MD as Consulting Physician (Radiation Oncology) Jillian Lutz, Jillian Raddle., MD as Consulting Physician (Gastroenterology) Jillian Adine CROME, DO as Consulting Physician (Pulmonary Disease) Jillian Standing, MD as Consulting Physician (General Surgery)  Date of Service:  04/30/2024  CHIEF COMPLAINT: f/u of rectal cancer  CURRENT THERAPY:  CapeOx and bevacizumab  every 3 weeks with oxaliplatin  every 6 weeks  Oncology History   Left leg DVT (HCC) -Doppler on July 13, 2022 showed left acute DVT involving popliteal and peroneal vein, CTA chest was negative for PE. -continue Xarelto , tolerating well    Rectal adenocarcinoma (HCC) cT3cN1M0 stage IIIb, biopsy confirmed residual primary tumor and oligo lung met in 09/2021 -Initially diagnosed 11/2020 by colonoscopy for progressive rectal pain and bleeding, weight loss, and constipation. -Despite strong recommendation, patient firmly and repeatedly declined IV chemo and surgery. She agreed to chemoRT with Xeloda , and received the treatment 01/04/21 - 02/14/21. -she developed local cancer progression and lung mets in 11/2021 -she started Xeloda  on 02/16/2022, low dose oxaliplatin  was added on 05/11/22 (with cycle 4 of Xeloda )  -chemo held since 07/2022 due to leg pain and recurrent nausea  -restaging CT 09/07/2022 showed stable disease in rectum and lung, no new lesions.   -she had chemo break from Feb-May 2024 -restart CAPOX with dose reduction 12/18/2022, not tolerating very well overall.  Oxaliplatin  was held on August 5. She is on oxaliplatin  every 6 weeks now.  -Restaging CT scan from 10/09/2023 showed stable rectal wall thickening, no other   evidence of metastasis  - Restaging CT scan from January 21, 2024 showed diminished circumferential wall thickening of a low rectal mass, no evidence of distant metastasis.  Assessment & Plan Rectal cancer Rectal cancer with recent pain at the site of the cancer, which has resolved as of today. No bleeding reported. - Administer bevacizumab  infusion today - Schedule next chemotherapy session for November 3rd - Provide three-month supply of oral chemotherapy for travel - Plan CT scan in four weeks - Schedule follow-up appointment before travel  Constipation Constipation managed with Miralax, taken twice daily. Constipation previously reported, but no current issues.   Plan - She is clinically stable, lab reviewed, will proceed to bevacizumab  today - Continue capecitabine  at home, 1 days on and 7 days off - I reviewed mirtazapine  and tramadol  for her today - She will return in 3 weeks for oxaliplatin  and bevacizumab  infusion - Plan to repeat CT scan in 4 weeks  SUMMARY OF ONCOLOGIC HISTORY: Oncology History Overview Note  Cancer Staging Rectal adenocarcinoma [facility]) Staging form: Colon and Rectum, AJCC 8th Edition - Clinical stage from 12/21/2020: Stage IIIB (cT3, cN1, cM0) - Unsigned    Rectal adenocarcinoma (HCC)  08/26/2020 Miscellaneous   Initial presentation to PCP, reporting intermittent BRBPR since 02/2020    12/01/2020 Procedure   Colonoscopy by Dr. Shila findings - The perianal and digital rectal examinations were normal. - A 15 mm polyp was found in the ascending colon. The polyp was semi-pedunculated. Resection and retrieval were complete. - A 25 mm polyp was found in the sigmoid colon. The polyp was pedunculated. Resection and retrieval were complete. - An infiltrative partially obstructing large mass was found in the proximal rectum. The mass was partially circumferential (  involving one-half of the lumen circumference). The mass measured eight cm in length extending from 10  to 18cm from anal verge. This was biopsied with a cold forceps for histology. Proximal and distal opposite fold area of the mass lesion was tattooed with an injection of total 3 mL of Spot (carbon black).   12/01/2020 Initial Biopsy   Diagnosis 1. Colon, polyp(s), ascending x 1 - ADENOCARCINOMA ARISING IN A TUBULAR ADENOMA WITH HIGH-GRADE DYSPLASIA. SEE NOTE 2. Colon, sigmoid polyp, x1 - TUBULOVILLOUS ADENOMA(S) - NEGATIVE FOR HIGH-GRADE DYSPLASIA OR MALIGNANCY 3. Rectum, biopsy - ADENOCARCINOMA. SEE NOTE   12/01/2020 Cancer Staging   Cancer Staging Rectal adenocarcinoma [facility]) Staging form: Colon and Rectum, AJCC 8th Edition - Clinical stage from 12/21/2020: Stage IIIB (cT3, cN1, cM0) - Unsigned    12/06/2020 Imaging   CT CAP with contrast IMPRESSION: 1. There is partially circumferential soft tissue thickening of the mid to superior rectum, approximately 5 cm in length and the inferior extent approximately 6 cm above the anal verge, consistent with primary rectal malignancy identified by colonoscopy. 2. There appear to be abnormally enlarged perirectal lymph nodes posteriorly about the superior rectum measuring up to 1.0 x 0.8 cm. Findings are suspicious for perirectal nodal metastatic disease, however rectal MRI is the test of choice for initial local staging of rectal cancer. 3. There is a 4 mm nonspecific pulmonary nodule of the superior segment left lower lobe, statistically most likely incidental, infectious or inflammatory, although nonspecific and isolated metastatic disease is not strictly excluded. Attention on follow-up. 4. No other evidence of metastatic disease in the chest, abdomen, or pelvis. Aortic Atherosclerosis (ICD10-I70.0).   12/09/2020 Imaging   Local staging MRI pelvis without contrast IMPRESSION: 4.9 cm circumferential mid/lower rectal mass, corresponding to the patient's newly diagnosed rectal cancer. Rectal adenocarcinoma T stage: T3c Rectal  adenocarcinoma N stage:  N1 Distance from tumor to the internal anal sphincter is 3.7 cm.   12/21/2020 Initial Diagnosis   Rectal adenocarcinoma (HCC)   01/04/2021 -  Chemotherapy   Concurrent chemoradiation with Xeloda  1000mg  in the AM and 1500mg  in the PM on days of Radiation starting 01/04/21.       01/04/2021 - 02/11/2021 Radiation Therapy   Concurrent chemoradiation with Dr Jillian and Xeloda  starting 01/04/21.    01/31/2022 Procedure   Colonoscopy, Dr. Shila  Findings: - An infiltrative partially obstructing large mass was found in the rectum. The mass was circumferential. The mass measured ten cm in length, extending from 5-15cm from anal verge. In addition, rectal diameter at narrowest approx twelve mm. Oozing was present. Biopsies were taken with a cold forceps for histology.  Impression: - Hemorrhoids found on perianal exam. - Likely malignant partially obstructing tumor in the rectum. Biopsied. - Non-bleeding external and internal hemorrhoids.   01/31/2022 Pathology Results   Diagnosis Rectum, biopsy INVASIVE MODERATELY DIFFERENTIATED ADENOCARCINOMA   05/11/2022 -  Chemotherapy   Patient is on Treatment Plan : COLORECTAL Xelox (Capeox)(130/850) q21d     09/07/2022 Imaging    IMPRESSION: 1. No significant change in circumferential wall thickening of the rectum, in keeping with known primary rectal mass. Similar appearance of post treatment perirectal and presacral fat stranding and fascial thickening 2. Unchanged nodule of the superior segment left lower lobe with adjacent bandlike scarring. No new nodules. 3. No evidence of lymphadenopathy or other metastatic disease in the chest, abdomen, or pelvis. 4. Large burden of stool throughout the colon. 5. Cardiomegaly.   12/08/2022 Imaging    IMPRESSION: Persistent  wall thickening along the rectum with adjacent severe inflammatory stranding and nodularity. Please correlate with exact location of the distribution of the  patient's neoplasm.   No discrete new mass lesion, fluid collection or lymph node enlargement.   Improved visualization of the fractures involving the left side of the pubic symphysis as well as probable insufficiency fractures along the sacrum. Associated areas of increasing heterogeneous bony sclerosis along the pubic bones and sacrum. In principle sclerotic bone metastases would be in the differential but are felt to be less likely based on the overall appearance. If needed confirmatory MRI can be considered as clinically appropriate to further delineate.   Stable scarring and fibrotic changes along the lungs with a tiny nodular area along the superior segment of the left lower lobe. Simple continued follow up.   Enlarged heart.   10/09/2023 Imaging   CT chest abdomen and pelvis with contrast  MPRESSION: 1. Persistent circumferential wall thickening in the rectum, not substantially changed taking into account less rectal distention on the current study. 2. Marked stool volume throughout the colon proximal to the rectum, similar to minimally progressive in the interval. While this may reflect clinical constipation, a degree of stricture related to the rectal neoplasm cannot be excluded. 3. No findings on the current study to suggest progressive or metastatic disease. 4. Heterogeneous mineralization of the sacrum, as before. Nonspecific by CT, features may reflect old trauma.      Discussed the use of AI scribe software for clinical note transcription with the patient, who gave verbal consent to proceed.  History of Present Illness Jillian Lutz is a 73 year old female with rectal cancer who presents for follow-up.  Last week, she experienced pain at the site of the rectal cancer, but currently, she is not experiencing any pain. There is no bleeding. She is taking oral chemotherapy at home, two tablets twice a day, in the morning and afternoon. She uses Miralax twice a day to  soften her stool. She requests refills for tramadol  and mirtazapine , which she takes before bed. Her medications are mailed to her home from [facility]. She plans to travel to [place] on November 23rd and intends to stay for about three months, depending on her health condition. No pain or discomfort elsewhere in her body.     All other systems were reviewed with the patient and are negative.  MEDICAL HISTORY:  Past Medical History:  Diagnosis Date   Bilateral wrist pain 10/25/2017   Hypertension    Left leg DVT (deep venous thrombosis) (HCC) 06/2022   Neck mass 1998   Unknown biopsy results. Excised in [place].   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   Rectal cancer (HCC)    Trigger finger, acquired 02/08/2017    SURGICAL HISTORY: Past Surgical History:  Procedure Laterality Date   BRONCHIAL BIOPSY  12/13/2021   Procedure: BRONCHIAL BIOPSIES;  Surgeon: Jillian Adine CROME, DO;  Location: [facility];  Service: Pulmonary;;   BRONCHIAL NEEDLE ASPIRATION BIOPSY  12/13/2021   Procedure: BRONCHIAL NEEDLE ASPIRATION BIOPSIES;  Surgeon: Jillian Adine CROME, DO;  Location: [facility];  Service: Pulmonary;;   IR IMAGING GUIDED PORT INSERTION  09/17/2023   NECK SURGERY Left 1998   Excision of mass in [place]   VIDEO BRONCHOSCOPY WITH RADIAL ENDOBRONCHIAL ULTRASOUND  12/13/2021   Procedure: RADIAL ENDOBRONCHIAL ULTRASOUND;  Surgeon: Jillian Adine CROME, DO;  Location: [facility];  Service: Pulmonary;;    I have reviewed the social  history and family history with the patient and they are unchanged from previous note.  ALLERGIES:  is allergic to no known allergies.  MEDICATIONS:  Current Outpatient Medications  Medication Sig Dispense Refill   amLODipine  (NORVASC ) 5 MG tablet Take 1 tablet (5 mg total) by mouth daily. 90 tablet 3   brimonidine  (ALPHAGAN ) 0.2 % ophthalmic solution Place 1 drop into the right eye 2 (two) times daily. 10 mL 11    capecitabine  (XELODA ) 500 MG tablet Take 2 tabs every 12 hours, for 14 days then off for 7 days. Take 30mins after meal 56 tablet 2   dexamethasone  (DECADRON ) 4 MG tablet Take 1 tablet (4 mg total) by mouth daily. TAKE DAILY FOR 3-5 DAYS AFTER IV CHEMO 20 tablet 1   diclofenac  Sodium (VOLTAREN ) 1 % GEL Apply 4 grams topically 4 (four) times daily. 100 g 3   dorzolamide -timolol  (COSOPT ) 2-0.5 % ophthalmic solution Instill 1 drop into both eyes twice a day 10 mL 11   gabapentin  (NEURONTIN ) 100 MG capsule Take 1 capsule (100 mg total) by mouth 3 (three) times daily as needed. 90 capsule 2   hydrocortisone  (ANUSOL -HC) 25 MG suppository Place 1 suppository (25 mg total) rectally at bedtime. 12 suppository 1   lidocaine  (XYLOCAINE ) 5 % ointment Apply to anal area daily as needed. 50 g 1   lidocaine -prilocaine  (EMLA ) cream Apply topically daily as needed. 30 g 3   lisinopril -hydrochlorothiazide  (ZESTORETIC ) 20-12.5 MG tablet Take 2 tablets by mouth daily. 60 tablet 3   Multiple Vitamins-Minerals (MULTIVITAMIN WITH MINERALS) tablet Take 1 tablet by mouth daily. Gummy 50 mg     ondansetron  (ZOFRAN ) 8 MG tablet Take 1 tablet (8 mg total) by mouth every 8 (eight) hours as needed for nausea or vomiting. Take after 3 days of chemo 30 tablet 2   polyethylene glycol powder (GLYCOLAX/MIRALAX) 17 GM/SCOOP powder Take 1 Container by mouth once.     potassium chloride  (KLOR-CON  M) 10 MEQ tablet Take 1 tablet (10 mEq total) by mouth 2 (two) times daily. 60 tablet 3   prochlorperazine  (COMPAZINE ) 10 MG tablet Take 1 tablet (10 mg total) by mouth every 6 (six) hours as needed. 30 tablet 2   rivaroxaban  (XARELTO ) 20 MG TABS tablet Take 1 tablet (20 mg total) by mouth daily with supper. 30 tablet 2   urea  (CARMOL) 10 % cream Apply topically 2 times daily as needed. 85 g 3   mirtazapine  (REMERON ) 30 MG tablet Take 1 tablet (30 mg total) by mouth at bedtime. 30 tablet 3   traMADol  (ULTRAM ) 50 MG tablet Take 1 tablet (50 mg  total) by mouth every 12 (twelve) hours as needed. 60 tablet 0   No current facility-administered medications for this visit.   Facility-Administered Medications Ordered in Other Visits  Medication Dose Route Frequency Provider Last Rate Last Admin   sodium chloride  flush (NS) 0.9 % injection 10 mL  10 mL Intracatheter PRN Jillian Callander, MD        PHYSICAL EXAMINATION: ECOG PERFORMANCE STATUS: 1 - Symptomatic but completely ambulatory  Vitals:   04/30/24 1400  BP: 114/68  Pulse: 81  Resp: 17  Temp: 98.5 F (36.9 C)  SpO2: 99%   Wt Readings from Last 3 Encounters:  04/30/24 111 lb 8 oz (50.6 kg)  04/08/24 110 lb 12.8 oz (50.3 kg)  04/01/24 106 lb (48.1 kg)     GENERAL:alert, no distress and comfortable SKIN: skin color, texture, turgor are normal, no rashes or significant lesions  EYES: normal, Conjunctiva are pink and non-injected, sclera clear NECK: supple, thyroid normal size, non-tender, without nodularity LYMPH:  no palpable lymphadenopathy in the cervical, axillary  LUNGS: clear to auscultation and percussion with normal breathing effort HEART: regular rate & rhythm and no murmurs and no lower extremity edema ABDOMEN:abdomen soft, non-tender and normal bowel sounds Musculoskeletal:no cyanosis of digits and no clubbing  NEURO: alert & oriented x 3 with fluent speech, no focal motor/sensory deficits  Physical Exam    LABORATORY DATA:  I have reviewed the data as listed    Latest Ref Rng & Units 04/30/2024    2:01 PM 04/08/2024   11:05 AM 03/19/2024    9:58 AM  CBC  WBC 4.0 - 10.5 K/uL 5.0  4.8  4.6   Hemoglobin 12.0 - 15.0 g/dL 9.3  9.3  8.9   Hematocrit 36.0 - 46.0 % 29.1  28.7  27.1   Platelets 150 - 400 K/uL 245  259  202         Latest Ref Rng & Units 04/30/2024    2:01 PM 04/08/2024   11:05 AM 02/27/2024    9:43 AM  CMP  Glucose 70 - 99 mg/dL 884  865  809   BUN 8 - 23 mg/dL 17  17  23    Creatinine 0.44 - 1.00 mg/dL 9.14  9.13  9.14   Sodium 135 - 145  mmol/L 141  139  136   Potassium 3.5 - 5.1 mmol/L 3.3  3.5  3.1   Chloride 98 - 111 mmol/L 105  102  101   CO2 22 - 32 mmol/L 30  32  31   Calcium 8.9 - 10.3 mg/dL 8.8  8.6  8.5   Total Protein 6.5 - 8.1 g/dL 7.0  7.0  6.7   Total Bilirubin 0.0 - 1.2 mg/dL 0.3  0.3  0.4   Alkaline Phos 38 - 126 U/L 56  51  53   AST 15 - 41 U/L 14  14  13    ALT 0 - 44 U/L <5  <5  <5       RADIOGRAPHIC STUDIES: I have personally reviewed the radiological images as listed and agreed with the findings in the report. No results found.    Orders Placed This Encounter  Procedures   CT CHEST ABDOMEN PELVIS W CONTRAST    Lutz Status:   Future    Expected Date:   05/28/2024    Expiration Date:   04/30/2025    If indicated for the ordered procedure, I authorize the administration of contrast media per Radiology protocol:   Yes    Does the patient have a contrast media/X-ray dye allergy?:   No    Preferred imaging location?:   [facility]    Release to patient:   Immediate    If indicated for the ordered procedure, I authorize the administration of oral contrast media per Radiology protocol:   Yes   All questions were answered. The patient knows to call the clinic with any problems, questions or concerns. No barriers to learning was detected. The total time spent in the appointment was 30 minutes, including review of chart and various tests results, discussions about plan of care and coordination of care plan     Onita Mattock, MD 04/30/2024

## 2024-04-30 NOTE — Patient Instructions (Signed)
 [facility] - A DEPT OF [facility]  Discharge Instructions: Thank you for choosing [facility] to provide your oncology and hematology care.   If you have a lab appointment with the Cancer Center, please go directly to the Cancer Center and check in at the registration area.   Wear comfortable clothing and clothing appropriate for easy access to any Portacath or PICC line.   We strive to give you quality time with your provider. You may need to reschedule your appointment if you arrive late (15 or more minutes).  Arriving late affects you and other patients whose appointments are after yours.  Also, if you miss three or more appointments without notifying the office, you may be dismissed from the clinic at the provider's discretion.      For prescription refill requests, have your pharmacy contact our office and allow 72 hours for refills to be completed.    Today you received the following chemotherapy and/or immunotherapy agents Bevacizumab       To help prevent nausea and vomiting after your treatment, we encourage you to take your nausea medication as directed.  BELOW ARE SYMPTOMS THAT SHOULD BE REPORTED IMMEDIATELY: *FEVER GREATER THAN 100.4 F (38 C) OR HIGHER *CHILLS OR SWEATING *NAUSEA AND VOMITING THAT IS NOT CONTROLLED WITH YOUR NAUSEA MEDICATION *UNUSUAL SHORTNESS OF BREATH *UNUSUAL BRUISING OR BLEEDING *URINARY PROBLEMS (pain or burning when urinating, or frequent urination) *BOWEL PROBLEMS (unusual diarrhea, constipation, pain near the anus) TENDERNESS IN MOUTH AND THROAT WITH OR WITHOUT PRESENCE OF ULCERS (sore throat, sores in mouth, or a toothache) UNUSUAL RASH, SWELLING OR PAIN  UNUSUAL VAGINAL DISCHARGE OR ITCHING   Items with * indicate a potential emergency and should be followed up as soon as possible or go to the Emergency Department if any problems should occur.  Please show the CHEMOTHERAPY ALERT CARD or IMMUNOTHERAPY  ALERT CARD at check-in to the Emergency Department and triage nurse.  Should you have questions after your visit or need to cancel or reschedule your appointment, please contact [facility] - A DEPT OF [facility]  Dept: [phone]  and follow the prompts.  Office hours are 8:00 a.m. to 4:30 p.m. Monday - Friday. Please note that voicemails left after 4:00 p.m. may not be returned until the following business day.  We are closed weekends and major holidays. You have access to a nurse at all times for urgent questions. Please call the main number to the clinic Dept: [phone] and follow the prompts.   For any non-urgent questions, you may also contact your provider using MyChart. We now offer e-Visits for anyone 64 and older to request care online for non-urgent symptoms. For details visit [email or web address].   Also download the MyChart app! Go to the app store, search MyChart, open the app, select [facility], and log in with your MyChart username and password.

## 2024-04-30 NOTE — Assessment & Plan Note (Signed)
 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 -Despite strong recommendation, patient firmly and repeatedly declined IV chemo and surgery. She agreed to chemoRT with Xeloda , and received the treatment 01/04/21 - 02/14/21. -she developed local cancer progression and lung mets in 11/2021 -she started Xeloda  on 02/16/2022, low dose oxaliplatin  was added on 05/11/22 (with cycle 4 of Xeloda )  -chemo held since 07/2022 due to leg pain and recurrent nausea  -restaging CT 09/07/2022 showed stable disease in rectum and lung, no new lesions.   -she had chemo break from Feb-May 2024 -restart CAPOX with dose reduction 12/18/2022, not tolerating very well overall.  Oxaliplatin  was held on August 5. She is on oxaliplatin  every 6 weeks now.  -Restaging CT scan from 10/09/2023 showed stable rectal wall thickening, no other  evidence of metastasis  - Restaging CT scan from January 21, 2024 showed diminished circumferential wall thickening of a low rectal mass, no evidence of distant metastasis.

## 2024-05-01 ENCOUNTER — Other Ambulatory Visit: Payer: Self-pay

## 2024-05-03 ENCOUNTER — Ambulatory Visit

## 2024-05-05 ENCOUNTER — Other Ambulatory Visit: Payer: Self-pay

## 2024-05-06 ENCOUNTER — Other Ambulatory Visit: Payer: Self-pay

## 2024-05-14 ENCOUNTER — Ambulatory Visit: Payer: Self-pay | Admitting: Gastroenterology

## 2024-05-14 NOTE — Congregational Nurse Program (Signed)
 Home visit with interpreter [name].  States she is doing well and looking forward to upcoming trip to [place] on 06/08/2024.  She currently has 30 Xeloda  pills remaining which she started on 05/08/24 and should begin next round on 05/29/2024.  She states she is taking all medications as ordered. Reminded her of upcoming appointments 05/19/2024 at [facility] and 05/28/2024 at [facility] for C-T scan.  She stated she needs letter from Dr. Lanny regarding her cancer diagnosis as well as a 3 month supply of medication to take with her to [place].  [name] RN, Congregational Nurse [phone]

## 2024-05-15 ENCOUNTER — Other Ambulatory Visit: Payer: Self-pay

## 2024-05-18 NOTE — Assessment & Plan Note (Signed)
 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 -Despite strong recommendation, patient firmly and repeatedly declined IV chemo and surgery. She agreed to chemoRT with Xeloda , and received the treatment 01/04/21 - 02/14/21. -she developed local cancer progression and lung mets in 11/2021 -she started Xeloda  on 02/16/2022, low dose oxaliplatin  was added on 05/11/22 (with cycle 4 of Xeloda )  -chemo held since 07/2022 due to leg pain and recurrent nausea  -restaging CT 09/07/2022 showed stable disease in rectum and lung, no new lesions.   -she had chemo break from Feb-May 2024 -restart CAPOX with dose reduction 12/18/2022, not tolerating very well overall.  Oxaliplatin  was held on August 5. She is on oxaliplatin  every 6 weeks now.  -Restaging CT scan from 10/09/2023 showed stable rectal wall thickening, no other  evidence of metastasis  - Restaging CT scan from January 21, 2024 showed diminished circumferential wall thickening of a low rectal mass, no evidence of distant metastasis.

## 2024-05-18 NOTE — Assessment & Plan Note (Signed)
-  Doppler on July 13, 2022 showed left acute DVT involving popliteal and peroneal vein, CTA chest was negative for PE. -continue Xarelto, tolerating well

## 2024-05-19 ENCOUNTER — Other Ambulatory Visit (HOSPITAL_BASED_OUTPATIENT_CLINIC_OR_DEPARTMENT_OTHER): Payer: Self-pay

## 2024-05-19 ENCOUNTER — Other Ambulatory Visit: Payer: Self-pay

## 2024-05-19 ENCOUNTER — Encounter: Payer: Self-pay | Admitting: Hematology

## 2024-05-19 ENCOUNTER — Inpatient Hospital Stay (HOSPITAL_BASED_OUTPATIENT_CLINIC_OR_DEPARTMENT_OTHER)

## 2024-05-19 ENCOUNTER — Other Ambulatory Visit: Payer: Self-pay | Admitting: Hematology

## 2024-05-19 ENCOUNTER — Inpatient Hospital Stay: Admitting: Hematology

## 2024-05-19 ENCOUNTER — Other Ambulatory Visit: Payer: Self-pay | Admitting: *Deleted

## 2024-05-19 ENCOUNTER — Inpatient Hospital Stay: Attending: Physician Assistant

## 2024-05-19 ENCOUNTER — Other Ambulatory Visit (HOSPITAL_COMMUNITY): Payer: Self-pay

## 2024-05-19 VITALS — BP 128/82 | HR 79 | Temp 98.5°F | Resp 15 | Ht <= 58 in | Wt 109.2 lb

## 2024-05-19 DIAGNOSIS — E876 Hypokalemia: Secondary | ICD-10-CM

## 2024-05-19 DIAGNOSIS — C20 Malignant neoplasm of rectum: Secondary | ICD-10-CM

## 2024-05-19 DIAGNOSIS — I82432 Acute embolism and thrombosis of left popliteal vein: Secondary | ICD-10-CM | POA: Diagnosis not present

## 2024-05-19 DIAGNOSIS — R202 Paresthesia of skin: Secondary | ICD-10-CM

## 2024-05-19 DIAGNOSIS — Z5111 Encounter for antineoplastic chemotherapy: Secondary | ICD-10-CM | POA: Diagnosis present

## 2024-05-19 DIAGNOSIS — Z86718 Personal history of other venous thrombosis and embolism: Secondary | ICD-10-CM | POA: Insufficient documentation

## 2024-05-19 DIAGNOSIS — R630 Anorexia: Secondary | ICD-10-CM

## 2024-05-19 DIAGNOSIS — R112 Nausea with vomiting, unspecified: Secondary | ICD-10-CM

## 2024-05-19 DIAGNOSIS — K6289 Other specified diseases of anus and rectum: Secondary | ICD-10-CM

## 2024-05-19 DIAGNOSIS — I10 Essential (primary) hypertension: Secondary | ICD-10-CM | POA: Diagnosis not present

## 2024-05-19 DIAGNOSIS — Z923 Personal history of irradiation: Secondary | ICD-10-CM | POA: Diagnosis not present

## 2024-05-19 DIAGNOSIS — D509 Iron deficiency anemia, unspecified: Secondary | ICD-10-CM | POA: Diagnosis not present

## 2024-05-19 DIAGNOSIS — Z7901 Long term (current) use of anticoagulants: Secondary | ICD-10-CM | POA: Diagnosis not present

## 2024-05-19 DIAGNOSIS — Z79899 Other long term (current) drug therapy: Secondary | ICD-10-CM | POA: Diagnosis not present

## 2024-05-19 DIAGNOSIS — C7800 Secondary malignant neoplasm of unspecified lung: Secondary | ICD-10-CM | POA: Insufficient documentation

## 2024-05-19 LAB — CMP (CANCER CENTER ONLY)
ALT: <5 U/L (ref 0–44)
AST: 13 U/L — ABNORMAL LOW (ref 15–41)
Albumin: 3.6 g/dL (ref 3.5–5.0)
Alkaline Phosphatase: 51 U/L (ref 38–126)
Anion gap: 4 — ABNORMAL LOW (ref 5–15)
BUN: 18 mg/dL (ref 8–23)
CO2: 30 mmol/L (ref 22–32)
Calcium: 8.7 mg/dL — ABNORMAL LOW (ref 8.9–10.3)
Chloride: 103 mmol/L (ref 98–111)
Creatinine: 0.86 mg/dL (ref 0.44–1.00)
GFR, Estimated: >60 mL/min (ref >60)
Glucose, Bld: 122 mg/dL — ABNORMAL HIGH (ref 70–99)
Potassium: 3.5 mmol/L (ref 3.5–5.1)
Sodium: 137 mmol/L (ref 135–145)
Total Bilirubin: 0.5 mg/dL (ref 0.0–1.2)
Total Protein: 7.1 g/dL (ref 6.5–8.1)

## 2024-05-19 LAB — CBC WITH DIFFERENTIAL (CANCER CENTER ONLY)
Abs Immature Granulocytes: 0.02 K/uL (ref 0.00–0.07)
Basophils Absolute: 0.1 K/uL (ref 0.0–0.1)
Basophils Relative: 2 %
Eosinophils Absolute: 0.1 K/uL (ref 0.0–0.5)
Eosinophils Relative: 3 %
HCT: 31.4 % — ABNORMAL LOW (ref 36.0–46.0)
Hemoglobin: 10 g/dL — ABNORMAL LOW (ref 12.0–15.0)
Immature Granulocytes: 0 %
Lymphocytes Relative: 21 %
Lymphs Abs: 1 K/uL (ref 0.7–4.0)
MCH: 23.7 pg — ABNORMAL LOW (ref 26.0–34.0)
MCHC: 31.8 g/dL (ref 30.0–36.0)
MCV: 74.4 fL — ABNORMAL LOW (ref 80.0–100.0)
Monocytes Absolute: 0.7 K/uL (ref 0.1–1.0)
Monocytes Relative: 14 %
Neutro Abs: 2.9 K/uL (ref 1.7–7.7)
Neutrophils Relative %: 60 %
Platelet Count: 238 K/uL (ref 150–400)
RBC: 4.22 MIL/uL (ref 3.87–5.11)
RDW: 21.9 % — ABNORMAL HIGH (ref 11.5–15.5)
WBC Count: 4.8 K/uL (ref 4.0–10.5)
nRBC: 0 % (ref 0.0–0.2)

## 2024-05-19 LAB — FERRITIN: Ferritin: 24 ng/mL (ref 11–307)

## 2024-05-19 LAB — IRON AND IRON BINDING CAPACITY (CC-WL,HP ONLY)
Iron: 65 ug/dL (ref 28–170)
Saturation Ratios: 15 % (ref 10.4–31.8)
TIBC: 438 ug/dL (ref 250–450)
UIBC: 373 ug/dL (ref 148–442)

## 2024-05-19 MED ORDER — SODIUM CHLORIDE 0.9 % IV SOLN: Freq: Once | INTRAVENOUS | Status: AC

## 2024-05-19 MED ORDER — POTASSIUM CHLORIDE CRYS ER 10 MEQ PO TBCR
10.0000 meq | EXTENDED_RELEASE_TABLET | Freq: Every day | ORAL | 1 refills | Status: AC
  Filled 2024-05-19: qty 90, 90d supply, fill #0
  Filled 2024-08-12 – 2024-08-18 (×4): qty 90, 90d supply, fill #1

## 2024-05-19 MED ORDER — RIVAROXABAN 20 MG PO TABS
20.0000 mg | ORAL_TABLET | Freq: Every day | ORAL | 1 refills | Status: AC
  Filled 2024-05-19: qty 90, 90d supply, fill #0
  Filled 2024-08-12: qty 90, 90d supply, fill #1

## 2024-05-19 MED ORDER — SODIUM CHLORIDE 0.9 % IV SOLN
7.5000 mg/kg | Freq: Once | INTRAVENOUS | Status: AC
  Administered 2024-05-19: 350 mg via INTRAVENOUS
  Filled 2024-05-19: qty 14

## 2024-05-19 MED ORDER — GABAPENTIN 100 MG PO CAPS
100.0000 mg | ORAL_CAPSULE | Freq: Three times a day (TID) | ORAL | 1 refills | Status: AC | PRN
Start: 2024-05-19 — End: ?
  Filled 2024-05-19: qty 180, 60d supply, fill #0
  Filled 2024-05-28 (×2): qty 90, 30d supply, fill #1

## 2024-05-19 MED ORDER — OXALIPLATIN CHEMO INJECTION 100 MG/20ML
70.0000 mg/m2 | Freq: Once | INTRAVENOUS | Status: AC
  Administered 2024-05-19: 100 mg via INTRAVENOUS
  Filled 2024-05-19: qty 20

## 2024-05-19 MED ORDER — DEXAMETHASONE SODIUM PHOSPHATE 10 MG/ML IJ SOLN
10.0000 mg | Freq: Once | INTRAMUSCULAR | Status: AC
  Administered 2024-05-19: 10 mg via INTRAVENOUS

## 2024-05-19 MED ORDER — PALONOSETRON HCL INJECTION 0.25 MG/5ML
0.2500 mg | Freq: Once | INTRAVENOUS | Status: AC
  Administered 2024-05-19: 0.25 mg via INTRAVENOUS
  Filled 2024-05-19: qty 5

## 2024-05-19 MED ORDER — CETIRIZINE HCL 10 MG/ML IV SOLN
10.0000 mg | Freq: Once | INTRAVENOUS | Status: AC
  Administered 2024-05-19: 10 mg via INTRAVENOUS
  Filled 2024-05-19: qty 1

## 2024-05-19 MED ORDER — TRAMADOL HCL 50 MG PO TABS
50.0000 mg | ORAL_TABLET | Freq: Four times a day (QID) | ORAL | 0 refills | Status: AC | PRN
Start: 2024-05-19 — End: ?
  Filled 2024-05-19: qty 180, 45d supply, fill #0

## 2024-05-19 MED ORDER — CAPECITABINE 500 MG PO TABS
ORAL_TABLET | ORAL | 0 refills | Status: DC
  Filled 2024-05-19: qty 168, 63d supply, fill #0
  Filled 2024-05-19: qty 168, fill #0

## 2024-05-19 MED ORDER — DEXTROSE 5 % IV SOLN: Freq: Once | INTRAVENOUS | Status: AC

## 2024-05-19 MED ORDER — GABAPENTIN 100 MG PO CAPS
100.0000 mg | ORAL_CAPSULE | Freq: Three times a day (TID) | ORAL | 1 refills | Status: DC | PRN
  Filled 2024-05-19: qty 180, 60d supply, fill #0

## 2024-05-19 MED ORDER — MIRTAZAPINE 30 MG PO TABS
30.0000 mg | ORAL_TABLET | Freq: Every day | ORAL | 0 refills | Status: DC
  Filled 2024-05-19: qty 90, 90d supply, fill #0

## 2024-05-19 MED ORDER — FAMOTIDINE IN NACL 20-0.9 MG/50ML-% IV SOLN
20.0000 mg | Freq: Once | INTRAVENOUS | Status: AC
  Administered 2024-05-19: 20 mg via INTRAVENOUS
  Filled 2024-05-19: qty 50

## 2024-05-19 NOTE — Progress Notes (Signed)
 Per Dr. Lanny, okay for patient to proceed with treatment today without urine protein collection.

## 2024-05-19 NOTE — Addendum Note (Signed)
 Addended by: [name] on: 05/19/2024 03:23 PM   Modules accepted: Orders

## 2024-05-19 NOTE — Progress Notes (Signed)
 [facility]   Telephone:([phone]) [phone] Fax:([phone]) [phone]   Clinic Follow up Note   Patient Care Team: [name], DO as PCP - General (Internal Medicine) [name], NP as Nurse Practitioner (Nurse Practitioner) [name], MD as Consulting Physician (Hematology and Oncology) [name], MD as Consulting Physician (Radiation Oncology) [name], [name], MD as Consulting Physician (Gastroenterology) [name], DO as Consulting Physician (Pulmonary Disease) [name], MD as Consulting Physician (General Surgery)  Date of Service:  05/19/2024  CHIEF COMPLAINT: f/u of rectal cancer  CURRENT THERAPY:  Chemotherapy CapeOx and bevacizumab  every 3 weeks  Oncology History   Left leg DVT (HCC) -Doppler on July 13, 2022 showed left acute DVT involving popliteal and peroneal vein, CTA chest was negative for PE. -continue Xarelto , tolerating well    Rectal adenocarcinoma (HCC) cT3cN1M0 stage IIIb, biopsy confirmed residual primary tumor and oligo lung met in 09/2021 -Initially diagnosed 11/2020 by colonoscopy for progressive rectal pain and bleeding, weight loss, and constipation. -Despite strong recommendation, patient firmly and repeatedly declined IV chemo and surgery. She agreed to chemoRT with Xeloda , and received the treatment 01/04/21 - 02/14/21. -she developed local cancer progression and lung mets in 11/2021 -she started Xeloda  on 02/16/2022, low dose oxaliplatin  was added on 05/11/22 (with cycle 4 of Xeloda )  -chemo held since 07/2022 due to leg pain and recurrent nausea  -restaging CT 09/07/2022 showed stable disease in rectum and lung, no new lesions.   -she had chemo break from Feb-May 2024 -restart CAPOX with dose reduction 12/18/2022, not tolerating very well overall.  Oxaliplatin  was held on August 5. She is on oxaliplatin  every 6 weeks now.  -Restaging CT scan from 10/09/2023 showed stable rectal wall thickening, no other  evidence of  metastasis  - Restaging CT scan from January 21, 2024 showed diminished circumferential wall thickening of a low rectal mass, no evidence of distant metastasis.  Assessment & Plan Malignant neoplasm of rectum on chemotherapy Rectal cancer managed with oral chemotherapy. She is traveling to [place] for three months and requires medication refills for this period. Medicaid coverage may limit the duration of medication supply. - Refilled capecitabine  for three months, pending pharmacy approval. - Provided a letter documenting her diagnosis and treatment plan for travel purposes. - Advised continuation of oral chemotherapy without intravenous treatment during travel. - Offered to print medical records for potential consultation with a cancer doctor in [place].  Rectal pain Burning sensation, rated 3/10 in severity, worsened over the last three days. Pain is tolerable with tramadol . Possible correlation with discontinuation of gabapentin , though not confirmed. - Refilled tramadol   - Refilled gabapentin  for pain management.  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 - Refilled Xarelto  for three months.  Hypertension Managed with multiple medications. Blood pressure is well-controlled. Recent follow-up with primary care physician in July. - Refilled amlodipine  and lisinopril  as needed. - Advised to contact primary care physician for lisinopril  refill if not already approved.   Plan - Labs reviewed, adequate for treatment, will proceed oxaliplatin  and bevacizumab  today - She will continue capecitabine  1000 mg twice daily for 14 days on and 1 week off. - She will travel back to her home country in 2 weeks, and return in 3 months.  She will call me when she returns. - I reviewed most of her medications for 3 months. - Will check her iron  level to see if she needs IV iron .  SUMMARY OF ONCOLOGIC  HISTORY: Oncology History Overview Note  Cancer Staging Rectal adenocarcinoma [facility]) Staging form: Colon and Rectum, AJCC 8th Edition - Clinical stage from 12/21/2020: Stage IIIB (cT3, cN1, cM0) - Unsigned    Rectal adenocarcinoma (HCC)  08/26/2020 Miscellaneous   Initial presentation to PCP, reporting intermittent BRBPR since 02/2020    12/01/2020 Procedure   Colonoscopy by Dr. Shila findings - The perianal and digital rectal examinations were normal. - A 15 mm polyp was found in the ascending colon. The polyp was semi-pedunculated. Resection and retrieval were complete. - A 25 mm polyp was found in the sigmoid colon. The polyp was pedunculated. Resection and retrieval were complete. - An infiltrative partially obstructing large mass was found in the proximal rectum. The mass was partially circumferential (involving one-half of the lumen circumference). The mass measured eight cm in length extending from 10 to 18cm from anal verge. This was biopsied with a cold forceps for histology. Proximal and distal opposite fold area of the mass lesion was tattooed with an injection of total 3 mL of Spot (carbon black).   12/01/2020 Initial Biopsy   Diagnosis 1. Colon, polyp(s), ascending x 1 - ADENOCARCINOMA ARISING IN A TUBULAR ADENOMA WITH HIGH-GRADE DYSPLASIA. SEE NOTE 2. Colon, sigmoid polyp, x1 - TUBULOVILLOUS ADENOMA(S) - NEGATIVE FOR HIGH-GRADE DYSPLASIA OR MALIGNANCY 3. Rectum, biopsy - ADENOCARCINOMA. SEE NOTE   12/01/2020 Cancer Staging   Cancer Staging Rectal adenocarcinoma [facility]) Staging form: Colon and Rectum, AJCC 8th Edition - Clinical stage from 12/21/2020: Stage IIIB (cT3, cN1, cM0) - Unsigned    12/06/2020 Imaging   CT CAP with contrast IMPRESSION: 1. There is partially circumferential soft tissue thickening of the mid to superior rectum, approximately 5 cm in length and the inferior extent approximately 6 cm above the anal verge, consistent with primary rectal malignancy  identified by colonoscopy. 2. There appear to be abnormally enlarged perirectal lymph nodes posteriorly about the superior rectum measuring up to 1.0 x 0.8 cm. Findings are suspicious for perirectal nodal metastatic disease, however rectal MRI is the test of choice for initial local staging of rectal cancer. 3. There is a 4 mm nonspecific pulmonary nodule of the superior segment left lower lobe, statistically most likely incidental, infectious or inflammatory, although nonspecific and isolated metastatic disease is not strictly excluded. Attention on follow-up. 4. No other evidence of metastatic disease in the chest, abdomen, or pelvis. Aortic Atherosclerosis (ICD10-I70.0).   12/09/2020 Imaging   Local staging MRI pelvis without contrast IMPRESSION: 4.9 cm circumferential mid/lower rectal mass, corresponding to the patient's newly diagnosed rectal cancer. Rectal adenocarcinoma T stage: T3c Rectal adenocarcinoma N stage:  N1 Distance from tumor to the internal anal sphincter is 3.7 cm.   12/21/2020 Initial Diagnosis   Rectal adenocarcinoma (HCC)   01/04/2021 -  Chemotherapy   Concurrent chemoradiation with Xeloda  1000mg  in the AM and 1500mg  in the PM on days of Radiation starting 01/04/21.       01/04/2021 - 02/11/2021 Radiation Therapy   Concurrent chemoradiation with Dr Dewey and Xeloda  starting 01/04/21.    01/31/2022 Procedure   Colonoscopy, Dr. Shila  Findings: - An infiltrative partially obstructing large mass was found in the rectum. The mass was circumferential. The mass measured ten cm in length, extending from 5-15cm from anal verge. In addition, rectal diameter at narrowest approx twelve mm. Oozing was present. Biopsies were taken with a cold forceps for histology.  Impression: - Hemorrhoids found on perianal exam. - Likely malignant partially obstructing tumor in  the rectum. Biopsied. - Non-bleeding external and internal hemorrhoids.   01/31/2022 Pathology Results    Diagnosis Rectum, biopsy INVASIVE MODERATELY DIFFERENTIATED ADENOCARCINOMA   05/11/2022 -  Chemotherapy   Patient is on Treatment Plan : COLORECTAL Xelox (Capeox)(130/850) q21d     09/07/2022 Imaging    IMPRESSION: 1. No significant change in circumferential wall thickening of the rectum, in keeping with known primary rectal mass. Similar appearance of post treatment perirectal and presacral fat stranding and fascial thickening 2. Unchanged nodule of the superior segment left lower lobe with adjacent bandlike scarring. No new nodules. 3. No evidence of lymphadenopathy or other metastatic disease in the chest, abdomen, or pelvis. 4. Large burden of stool throughout the colon. 5. Cardiomegaly.   12/08/2022 Imaging    IMPRESSION: Persistent wall thickening along the rectum with adjacent severe inflammatory stranding and nodularity. Please correlate with exact location of the distribution of the patient's neoplasm.   No discrete new mass lesion, fluid collection or lymph node enlargement.   Improved visualization of the fractures involving the left side of the pubic symphysis as well as probable insufficiency fractures along the sacrum. Associated areas of increasing heterogeneous bony sclerosis along the pubic bones and sacrum. In principle sclerotic bone metastases would be in the differential but are felt to be less likely based on the overall appearance. If needed confirmatory MRI can be considered as clinically appropriate to further delineate.   Stable scarring and fibrotic changes along the lungs with a tiny nodular area along the superior segment of the left lower lobe. Simple continued follow up.   Enlarged heart.   10/09/2023 Imaging   CT chest abdomen and pelvis with contrast  MPRESSION: 1. Persistent circumferential wall thickening in the rectum, not substantially changed taking into account less rectal distention on the current study. 2. Marked stool volume  throughout the colon proximal to the rectum, similar to minimally progressive in the interval. While this may reflect clinical constipation, a degree of stricture related to the rectal neoplasm cannot be excluded. 3. No findings on the current study to suggest progressive or metastatic disease. 4. Heterogeneous mineralization of the sacrum, as before. Nonspecific by CT, features may reflect old trauma.      Discussed the use of AI scribe software for clinical note transcription with the patient, who gave verbal consent to proceed.  History of Present Illness Jillian Lutz is a 73 year old female with rectal cancer who presents for follow-up.  She experiences burning and hurting pain in the rectal area, rated as 3 out of 10 in intensity, which has worsened over the past three days. The pain is somewhat alleviated by tramadol , taken twice daily. No stomach pain, constipation, or bleeding.  She is taking capecitabine  as part of her chemotherapy regimen. She is planning a three-month trip to [place], departing on November 18th, and requires medication refills before her departure, including gabapentin , which she has been out of for four days and takes once daily in the evening.  Her medication regimen includes tramadol , gabapentin , amlodipine , Xarelto , potassium, and mirtazapine . She also uses timolol  eye drops and occasionally takes Compazine  for nausea. She is on Medicaid and pays a copay for her medications.     All other systems were reviewed with the patient and are negative.  MEDICAL HISTORY:  Past Medical History:  Diagnosis Date   Bilateral wrist pain 10/25/2017   Hypertension    Left leg DVT (deep venous thrombosis) (HCC) 06/2022   Neck mass 1998  Unknown biopsy results. Excised in [place].   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   Rectal cancer (HCC)    Trigger finger, acquired 02/08/2017    SURGICAL HISTORY: Past Surgical History:   Procedure Laterality Date   BRONCHIAL BIOPSY  12/13/2021   Procedure: BRONCHIAL BIOPSIES;  Surgeon: [name], DO;  Location: [facility];  Service: Pulmonary;;   BRONCHIAL NEEDLE ASPIRATION BIOPSY  12/13/2021   Procedure: BRONCHIAL NEEDLE ASPIRATION BIOPSIES;  Surgeon: [name], DO;  Location: [facility];  Service: Pulmonary;;   IR IMAGING GUIDED PORT INSERTION  09/17/2023   NECK SURGERY Left 1998   Excision of mass in [place]   VIDEO BRONCHOSCOPY WITH RADIAL ENDOBRONCHIAL ULTRASOUND  12/13/2021   Procedure: RADIAL ENDOBRONCHIAL ULTRASOUND;  Surgeon: [name], DO;  Location: [facility]OS[facility];  Service: Pulmonary;;    I have reviewed the social history and family history with the patient and they are unchanged from previous note.  ALLERGIES:  is allergic to no known allergies.  MEDICATIONS:  Current Outpatient Medications  Medication Sig Dispense Refill   amLODipine  (NORVASC ) 5 MG tablet Take 1 tablet (5 mg total) by mouth daily. 90 tablet 3   brimonidine  (ALPHAGAN ) 0.2 % ophthalmic solution Place 1 drop into the right eye 2 (two) times daily. 10 mL 11   capecitabine  (XELODA ) 500 MG tablet Take 2 tabs every 12 hours, for 14 days then off for 7 days. Take 30mins after meal 168 tablet 0   dexamethasone  (DECADRON ) 4 MG tablet Take 1 tablet (4 mg total) by mouth daily. TAKE DAILY FOR 3-5 DAYS AFTER IV CHEMO 20 tablet 1   diclofenac  Sodium (VOLTAREN ) 1 % GEL Apply 4 grams topically 4 (four) times daily. 100 g 3   dorzolamide -timolol  (COSOPT ) 2-0.5 % ophthalmic solution Instill 1 drop into both eyes twice a day 10 mL 11   gabapentin  (NEURONTIN ) 100 MG capsule Take 1 capsule (100 mg total) by mouth 3 (three) times daily as needed. 180 capsule 1   hydrocortisone  (ANUSOL -HC) 25 MG suppository Place 1 suppository (25 mg total) rectally at bedtime. 12 suppository 1   lidocaine  (XYLOCAINE ) 5 % ointment Apply to anal area daily as needed. 50 g 1   lidocaine -prilocaine  (EMLA ) cream  Apply topically daily as needed. 30 g 3   lisinopril -hydrochlorothiazide  (ZESTORETIC ) 20-12.5 MG tablet Take 2 tablets by mouth daily. 60 tablet 3   mirtazapine  (REMERON ) 30 MG tablet Take 1 tablet (30 mg total) by mouth at bedtime. 90 tablet 0   Multiple Vitamins-Minerals (MULTIVITAMIN WITH MINERALS) tablet Take 1 tablet by mouth daily. Gummy 50 mg     ondansetron  (ZOFRAN ) 8 MG tablet Take 1 tablet (8 mg total) by mouth every 8 (eight) hours as needed for nausea or vomiting. Take after 3 days of chemo 30 tablet 2   polyethylene glycol powder (GLYCOLAX/MIRALAX) 17 GM/SCOOP powder Take 1 Container by mouth once.     potassium chloride  (KLOR-CON  M) 10 MEQ tablet Take 1 tablet (10 mEq total) by mouth daily. 90 tablet 1   prochlorperazine  (COMPAZINE ) 10 MG tablet Take 1 tablet (10 mg total) by mouth every 6 (six) hours as needed. 30 tablet 2   rivaroxaban  (XARELTO ) 20 MG TABS tablet Take 1 tablet (20 mg total) by mouth daily with supper. 90 tablet 1   traMADol  (ULTRAM ) 50 MG tablet Take 1 tablet (50 mg total) by mouth every 6 (six) hours as needed. 180 tablet 0   urea  (CARMOL) 10 %  cream Apply topically 2 times daily as needed. 85 g 3   No current facility-administered medications for this visit.   Facility-Administered Medications Ordered in Other Visits  Medication Dose Route Frequency Provider Last Rate Last Admin   bevacizumab -adcd (VEGZELMA ) 350 mg in sodium chloride  0.9 % 100 mL chemo infusion  7.5 mg/kg (Treatment Plan Recorded) Intravenous Once [name], MD       cetirizine TOBI) injection 10 mg  10 mg Intravenous Once [name], MD       dexamethasone  (DECADRON ) injection 10 mg  10 mg Intravenous Once [name], MD       dextrose  5 % solution   Intravenous Once [name], MD       famotidine  (PEPCID ) IVPB 20 mg premix  20 mg Intravenous Once [name], MD       oxaliplatin  (ELOXATIN ) 100 mg in dextrose  5 % 500 mL chemo infusion  70 mg/m2 (Treatment Plan Recorded) Intravenous Once  [name], MD       palonosetron  (ALOXI ) injection 0.25 mg  0.25 mg Intravenous Once [name], MD        PHYSICAL EXAMINATION: ECOG PERFORMANCE STATUS: 2 - Symptomatic, <50% confined to bed  Vitals:   05/19/24 1019  BP: 128/82  Pulse: 79  Resp: 15  Temp: 98.5 F (36.9 C)  SpO2: 100%   Wt Readings from Last 3 Encounters:  05/19/24 109 lb 3.2 oz (49.5 kg)  04/30/24 111 lb 8 oz (50.6 kg)  04/08/24 110 lb 12.8 oz (50.3 kg)     GENERAL:alert, no distress and comfortable SKIN: skin color, texture, turgor are normal, no rashes or significant lesions EYES: normal, Conjunctiva are pink and non-injected, sclera clear NECK: supple, thyroid normal size, non-tender, without nodularity LYMPH:  no palpable lymphadenopathy in the cervical, axillary  LUNGS: clear to auscultation and percussion with normal breathing effort HEART: regular rate & rhythm and no murmurs and no lower extremity edema ABDOMEN:abdomen soft, non-tender and normal bowel sounds Musculoskeletal:no cyanosis of digits and no clubbing  NEURO: alert & oriented x 3 with fluent speech, no focal motor/sensory deficits  Physical Exam    LABORATORY DATA:  I have reviewed the data as listed    Latest Ref Rng & Units 05/19/2024    9:52 AM 04/30/2024    2:01 PM 04/08/2024   11:05 AM  CBC  WBC 4.0 - 10.5 K/uL 4.8  5.0  4.8   Hemoglobin 12.0 - 15.0 g/dL 89.9  9.3  9.3   Hematocrit 36.0 - 46.0 % 31.4  29.1  28.7   Platelets 150 - 400 K/uL 238  245  259         Latest Ref Rng & Units 05/19/2024    9:52 AM 04/30/2024    2:01 PM 04/08/2024   11:05 AM  CMP  Glucose 70 - 99 mg/dL 877  884  865   BUN 8 - 23 mg/dL 18  17  17    Creatinine 0.44 - 1.00 mg/dL 9.13  9.14  9.13   Sodium 135 - 145 mmol/L 137  141  139   Potassium 3.5 - 5.1 mmol/L 3.5  3.3  3.5   Chloride 98 - 111 mmol/L 103  105  102   CO2 22 - 32 mmol/L 30  30  32   Calcium 8.9 - 10.3 mg/dL 8.7  8.8  8.6   Total Protein 6.5 - 8.1 g/dL 7.1  7.0  7.0   Total  Bilirubin 0.0 - 1.2  mg/dL 0.5  0.3  0.3   Alkaline Phos 38 - 126 U/L 51  56  51   AST 15 - 41 U/L 13  14  14    ALT 0 - 44 U/L <5  <5  <5       RADIOGRAPHIC STUDIES: I have personally reviewed the radiological images as listed and agreed with the findings in the report. No results found.    No orders of the defined types were placed in this encounter.  All questions were answered. The patient knows to call the clinic with any problems, questions or concerns. No barriers to learning was detected. The total time spent in the appointment was 40 minutes, including review of chart and various tests results, discussions about plan of care and coordination of care plan     Onita Mattock, MD 05/19/2024

## 2024-05-19 NOTE — Congregational Nurse Program (Signed)
 Accompanied patient to OV with Dr. Lanny at [facility].  Patient requested medication refills for 3 months as she is leaving [place] on 06/03/2024 to go to [place].  She also asked for a letter with her diagnoses and any medical records which would outline her treatment plan. She is uncertain if she will see a doctor while there but wants to have information available.  She did not wish to schedule follow-up appointments today but prefers to wait until her return.  [name] RN, Congregational Nurse [phone]

## 2024-05-19 NOTE — Patient Instructions (Signed)
 [facility] - A DEPT OF [facility]  Discharge Instructions: Thank you for choosing [facility] to provide your oncology and hematology care.   If you have a lab appointment with the Cancer Center, please go directly to the Cancer Center and check in at the registration area.   Wear comfortable clothing and clothing appropriate for easy access to any Portacath or PICC line.   We strive to give you quality time with your provider. You may need to reschedule your appointment if you arrive late (15 or more minutes).  Arriving late affects you and other patients whose appointments are after yours.  Also, if you miss three or more appointments without notifying the office, you may be dismissed from the clinic at the provider's discretion.      For prescription refill requests, have your pharmacy contact our office and allow 72 hours for refills to be completed.    Today you received the following chemotherapy and/or immunotherapy agents :  Bevacizumab  and Oxaliplatin    To help prevent nausea and vomiting after your treatment, we encourage you to take your nausea medication as directed.  BELOW ARE SYMPTOMS THAT SHOULD BE REPORTED IMMEDIATELY: *FEVER GREATER THAN 100.4 F (38 C) OR HIGHER *CHILLS OR SWEATING *NAUSEA AND VOMITING THAT IS NOT CONTROLLED WITH YOUR NAUSEA MEDICATION *UNUSUAL SHORTNESS OF BREATH *UNUSUAL BRUISING OR BLEEDING *URINARY PROBLEMS (pain or burning when urinating, or frequent urination) *BOWEL PROBLEMS (unusual diarrhea, constipation, pain near the anus) TENDERNESS IN MOUTH AND THROAT WITH OR WITHOUT PRESENCE OF ULCERS (sore throat, sores in mouth, or a toothache) UNUSUAL RASH, SWELLING OR PAIN  UNUSUAL VAGINAL DISCHARGE OR ITCHING   Items with * indicate a potential emergency and should be followed up as soon as possible or go to the Emergency Department if any problems should occur.  Please show the CHEMOTHERAPY ALERT CARD  or IMMUNOTHERAPY ALERT CARD at check-in to the Emergency Department and triage nurse.  Should you have questions after your visit or need to cancel or reschedule your appointment, please contact [facility] - A DEPT OF [facility]  Dept: [phone]  and follow the prompts.  Office hours are 8:00 a.m. to 4:30 p.m. Monday - Friday. Please note that voicemails left after 4:00 p.m. may not be returned until the following business day.  We are closed weekends and major holidays. You have access to a nurse at all times for urgent questions. Please call the main number to the clinic Dept: [phone] and follow the prompts.   For any non-urgent questions, you may also contact your provider using MyChart. We now offer e-Visits for anyone 80 and older to request care online for non-urgent symptoms. For details visit [email or web address].   Also download the MyChart app! Go to the app store, search MyChart, open the app, select [place], and log in with your MyChart username and password.

## 2024-05-19 NOTE — Addendum Note (Signed)
 Addended by: [name] on: 05/19/2024 01:41 PM   Modules accepted: Orders

## 2024-05-20 ENCOUNTER — Other Ambulatory Visit: Payer: Self-pay

## 2024-05-20 ENCOUNTER — Other Ambulatory Visit: Payer: Self-pay | Admitting: Hematology

## 2024-05-20 ENCOUNTER — Other Ambulatory Visit (HOSPITAL_COMMUNITY): Payer: Self-pay

## 2024-05-20 ENCOUNTER — Telehealth: Payer: Self-pay

## 2024-05-20 MED ORDER — CAPECITABINE 500 MG PO TABS
ORAL_TABLET | ORAL | 0 refills | Status: AC
  Filled 2024-05-20 – 2024-05-21 (×3): qty 224, 84d supply, fill #0

## 2024-05-20 NOTE — Telephone Encounter (Signed)
 Oral Oncology Patient Advocate Encounter   Received notification that prior authorization for Capecitabine  84 day supply is required.   PA submitted on 05/20/2024 Key [id number] Status is pending      Charlott Hamilton,  CPhT-Adv  she/her/hers [facility]  [facility] Pharmacy Technician Patient Advocate Specialist III [facility] Phone: [phone]  Fax: [phone] [email or web address]

## 2024-05-21 ENCOUNTER — Other Ambulatory Visit (HOSPITAL_COMMUNITY): Payer: Self-pay

## 2024-05-21 ENCOUNTER — Telehealth: Payer: Self-pay

## 2024-05-21 ENCOUNTER — Other Ambulatory Visit: Payer: Self-pay

## 2024-05-21 NOTE — Progress Notes (Signed)
 Correction: original cash price that I provide Jillian Lutz is incorrect. Cash price for 224 tabs is actually $78.40. Jillian Lutz will follow-up with patient to discuss updated cost.

## 2024-05-21 NOTE — Congregational Nurse Program (Signed)
 Patient brought mail and requested help understanding.  [employer] intern Meghann Kacszynski and interpreter Loralyn Jenkins assisted patient.  Two were surveys from [facility] and the other was a bill for $93.  Zell was paid over the phone with a debit card but when CN questioned why she received the bill [employer] intern called her insurance provider who stated they did not see where anything was denied and that she should have only had to pay $4 co-pays.  Insurance rep will reach out to [facility] billing and hopefully patient will be able to get a refund.  [name] RN, Congregational Nurse [phone]

## 2024-05-21 NOTE — Progress Notes (Signed)
 Specialty Pharmacy Refill Coordination Note  Jillian Lutz is a 73 y.o. female contacted today regarding refills of specialty medication(s) Capecitabine  (XELODA )   Patient requested Delivery   Delivery date: 05/27/24   Verified address: [address] [place]   Medication will be filled on: 05/26/24   Spoke with patient's nurse Marletta) - okay to send 84ds of Xeloda  for $22.40. Patient plan limits vacation override day supply to 34 days.

## 2024-05-21 NOTE — Telephone Encounter (Signed)
 Oral Oncology Patient Advocate Encounter  I attempted to do a PA for an 84 day supply of Capecitabine , unfortunately her plan will not consider since the medication itself does not require a PA. Most likely a call to the insurance to get a vacation override at the time of filling will get the patient enough meds to cover her vacation.   Charlott Hamilton,  CPhT-Adv  she/her/hers [facility]  [facility] Pharmacy Technician Patient Advocate Specialist III [facility] Phone: [phone]  Fax: [phone] [email or web address]

## 2024-05-21 NOTE — Telephone Encounter (Signed)
 Patient called to say that she forgot to talk to Dr. Lanny about caring for her port while on vacation in [place] for approximately 3 months.  CN told her that a message will be sent to Dr. Lanny with her concern.  [name] RN, Congregational Nurse [phone]

## 2024-05-22 ENCOUNTER — Other Ambulatory Visit: Payer: Self-pay

## 2024-05-26 ENCOUNTER — Other Ambulatory Visit: Payer: Self-pay

## 2024-05-28 ENCOUNTER — Ambulatory Visit (HOSPITAL_COMMUNITY)
Admission: RE | Admit: 2024-05-28 | Discharge: 2024-05-28 | Disposition: A | Discharge status: Home / Self Care | Source: Ambulatory Visit | Attending: Hematology | Admitting: Hematology

## 2024-05-28 ENCOUNTER — Other Ambulatory Visit (HOSPITAL_COMMUNITY): Payer: Self-pay

## 2024-05-28 ENCOUNTER — Encounter: Payer: Self-pay | Admitting: Hematology

## 2024-05-28 DIAGNOSIS — C20 Malignant neoplasm of rectum: Secondary | ICD-10-CM | POA: Insufficient documentation

## 2024-05-28 DIAGNOSIS — I700 Atherosclerosis of aorta: Secondary | ICD-10-CM | POA: Diagnosis not present

## 2024-05-28 MED ORDER — SODIUM CHLORIDE (PF) 0.9 % IJ SOLN
INTRAMUSCULAR | Status: AC
  Filled 2024-05-28: qty 50

## 2024-05-28 MED ORDER — IOHEXOL 300 MG/ML  SOLN
100.0000 mL | Freq: Once | INTRAMUSCULAR | Status: AC | PRN
  Administered 2024-05-28: 100 mL via INTRAVENOUS

## 2024-05-28 MED ORDER — LATANOPROST 0.005 % OP SOLN
1.0000 [drp] | Freq: Every day | OPHTHALMIC | 3 refills | Status: AC
  Filled 2024-05-28 (×2): qty 7.5, 75d supply, fill #0
  Filled 2024-08-05: qty 7.5, 75d supply, fill #1

## 2024-05-28 MED ORDER — IOHEXOL 9 MG/ML PO SOLN
1000.0000 mL | ORAL | Status: AC
  Administered 2024-05-28: 1000 mL via ORAL

## 2024-05-28 MED ORDER — HEPARIN SOD (PORK) LOCK FLUSH 100 UNIT/ML IV SOLN
500.0000 [IU] | Freq: Once | INTRAVENOUS | Status: AC
  Administered 2024-05-28: 500 [IU] via INTRAVENOUS

## 2024-05-28 NOTE — Congregational Nurse Program (Signed)
 CN office visit.  Reviewed medications to confirm she has 3 months supply for trip to [place] on 06/08/2024.  She only had 2 months supply of Gabapentin  and minimal amount of eye drops for glaucoma.  Phone call to [facility] pharmacy and spoke to Zachery who was able to obtain over ride from [facility] to obtain remaining  30 days of Gabapentin .  CN called [facility] who sent in prescription for eye drops.  CN will pick up tomorrow and take to patient.  Gave patient a copy of instructions for port care while she is gone as well as dates to start xeloda .  [name] RN, Congregational Nurse [phone].

## 2024-05-29 ENCOUNTER — Other Ambulatory Visit (HOSPITAL_COMMUNITY): Payer: Self-pay

## 2024-07-15 ENCOUNTER — Other Ambulatory Visit: Payer: Self-pay

## 2024-08-05 ENCOUNTER — Other Ambulatory Visit (HOSPITAL_COMMUNITY): Payer: Self-pay

## 2024-08-07 ENCOUNTER — Other Ambulatory Visit (HOSPITAL_COMMUNITY): Payer: Self-pay

## 2024-08-08 ENCOUNTER — Other Ambulatory Visit (HOSPITAL_COMMUNITY): Payer: Self-pay

## 2024-08-11 ENCOUNTER — Other Ambulatory Visit: Payer: Self-pay

## 2024-08-12 ENCOUNTER — Other Ambulatory Visit: Payer: Self-pay

## 2024-08-13 ENCOUNTER — Other Ambulatory Visit (HOSPITAL_COMMUNITY): Payer: Self-pay

## 2024-08-13 ENCOUNTER — Other Ambulatory Visit: Payer: Self-pay

## 2024-08-14 ENCOUNTER — Other Ambulatory Visit: Payer: Self-pay

## 2024-08-14 ENCOUNTER — Other Ambulatory Visit (HOSPITAL_COMMUNITY): Payer: Self-pay

## 2024-08-16 ENCOUNTER — Other Ambulatory Visit: Payer: Self-pay | Admitting: Hematology

## 2024-08-16 DIAGNOSIS — C20 Malignant neoplasm of rectum: Secondary | ICD-10-CM

## 2024-08-16 DIAGNOSIS — R630 Anorexia: Secondary | ICD-10-CM

## 2024-08-18 ENCOUNTER — Other Ambulatory Visit: Payer: Self-pay

## 2024-08-19 ENCOUNTER — Other Ambulatory Visit (HOSPITAL_COMMUNITY): Payer: Self-pay

## 2024-08-19 ENCOUNTER — Other Ambulatory Visit: Payer: Self-pay

## 2024-08-20 ENCOUNTER — Other Ambulatory Visit: Payer: Self-pay

## 2024-08-21 ENCOUNTER — Other Ambulatory Visit: Payer: Self-pay

## 2024-08-21 ENCOUNTER — Encounter: Payer: Self-pay | Admitting: Hematology

## 2024-08-21 ENCOUNTER — Other Ambulatory Visit (HOSPITAL_COMMUNITY): Payer: Self-pay

## 2024-08-21 MED ORDER — MIRTAZAPINE 30 MG PO TABS
30.0000 mg | ORAL_TABLET | Freq: Every day | ORAL | 0 refills | Status: AC
  Filled 2024-08-21: qty 90, 90d supply, fill #0

## 2024-08-22 ENCOUNTER — Other Ambulatory Visit (HOSPITAL_COMMUNITY): Payer: Self-pay
